# Patient Record
Sex: Male | Born: 1945 | Race: White | Hispanic: No | Smoking: Former smoker
Health system: Southern US, Community
[De-identification: ages and names within clinical notes are randomized; demographics above are authoritative.]

## PROBLEM LIST (undated history)

## (undated) DIAGNOSIS — N186 End stage renal disease: Secondary | ICD-10-CM

## (undated) DIAGNOSIS — K851 Biliary acute pancreatitis without necrosis or infection: Secondary | ICD-10-CM

## (undated) DIAGNOSIS — F418 Other specified anxiety disorders: Secondary | ICD-10-CM

## (undated) DIAGNOSIS — I119 Hypertensive heart disease without heart failure: Secondary | ICD-10-CM

## (undated) DIAGNOSIS — C61 Malignant neoplasm of prostate: Secondary | ICD-10-CM

## (undated) DIAGNOSIS — I2510 Atherosclerotic heart disease of native coronary artery without angina pectoris: Secondary | ICD-10-CM

## (undated) DIAGNOSIS — I5042 Chronic combined systolic (congestive) and diastolic (congestive) heart failure: Secondary | ICD-10-CM

## (undated) DIAGNOSIS — Z8619 Personal history of other infectious and parasitic diseases: Secondary | ICD-10-CM

## (undated) DIAGNOSIS — D696 Thrombocytopenia, unspecified: Secondary | ICD-10-CM

## (undated) DIAGNOSIS — I255 Ischemic cardiomyopathy: Secondary | ICD-10-CM

## (undated) DIAGNOSIS — IMO0001 Reserved for inherently not codable concepts without codable children: Secondary | ICD-10-CM

## (undated) DIAGNOSIS — R162 Hepatomegaly with splenomegaly, not elsewhere classified: Secondary | ICD-10-CM

## (undated) DIAGNOSIS — Z794 Long term (current) use of insulin: Secondary | ICD-10-CM

## (undated) DIAGNOSIS — E7800 Pure hypercholesterolemia, unspecified: Secondary | ICD-10-CM

## (undated) DIAGNOSIS — K219 Gastro-esophageal reflux disease without esophagitis: Secondary | ICD-10-CM

## (undated) DIAGNOSIS — E119 Type 2 diabetes mellitus without complications: Secondary | ICD-10-CM

## (undated) DIAGNOSIS — N184 Chronic kidney disease, stage 4 (severe): Secondary | ICD-10-CM

## (undated) HISTORY — PX: LIVER BIOPSY: SHX301

## (undated) HISTORY — PX: COLONOSCOPY: SHX174

## (undated) HISTORY — PX: TRANSTHORACIC ECHOCARDIOGRAM: SHX275

## (undated) HISTORY — PX: PENILE PROSTHESIS IMPLANT: SHX240

## (undated) HISTORY — DX: Chronic combined systolic (congestive) and diastolic (congestive) heart failure: I50.42

## (undated) HISTORY — DX: Chronic kidney disease, stage 4 (severe): N18.4

## (undated) HISTORY — DX: Ischemic cardiomyopathy: I25.5

## (undated) HISTORY — PX: CORONARY ANGIOPLASTY: SHX604

## (undated) HISTORY — PX: CARDIAC SURGERY: SHX584

## (undated) HISTORY — PX: SHOULDER ARTHROSCOPY: SHX128

## (undated) HISTORY — DX: Atherosclerotic heart disease of native coronary artery without angina pectoris: I25.10

## (undated) HISTORY — DX: Hypertensive heart disease without heart failure: I11.9

---

## 1999-02-07 HISTORY — PX: CORONARY ARTERY BYPASS GRAFT: SHX141

## 2004-03-09 ENCOUNTER — Ambulatory Visit: Payer: Self-pay | Admitting: Internal Medicine

## 2006-06-16 ENCOUNTER — Emergency Department: Payer: Self-pay | Admitting: Internal Medicine

## 2008-01-08 ENCOUNTER — Ambulatory Visit: Payer: Self-pay | Admitting: Cardiovascular Disease

## 2008-01-20 ENCOUNTER — Ambulatory Visit: Payer: Self-pay | Admitting: Oncology

## 2008-02-04 LAB — CMP (CANCER CENTER ONLY)
ALT(SGPT): 23 U/L (ref 10–47)
AST: 28 U/L (ref 11–38)
Albumin: 3.8 g/dL (ref 3.3–5.5)
Alkaline Phosphatase: 66 U/L (ref 26–84)
BUN, Bld: 18 mg/dL (ref 7–22)
Chloride: 102 mEq/L (ref 98–108)
Potassium: 4.3 mEq/L (ref 3.3–4.7)
Sodium: 139 mEq/L (ref 128–145)
Total Protein: 6.9 g/dL (ref 6.4–8.1)

## 2008-02-04 LAB — MORPHOLOGY - CHCC SATELLITE: PLT EST ~~LOC~~: DECREASED

## 2008-02-04 LAB — CBC WITH DIFFERENTIAL (CANCER CENTER ONLY)
BASO%: 1.3 % (ref 0.0–2.0)
Eosinophils Absolute: 0.1 10*3/uL (ref 0.0–0.5)
LYMPH#: 1.3 10*3/uL (ref 0.9–3.3)
LYMPH%: 23 % (ref 14.0–48.0)
MCV: 86 fL (ref 82–98)
MONO#: 0.3 10*3/uL (ref 0.1–0.9)
NEUT#: 4 10*3/uL (ref 1.5–6.5)
Platelets: 81 10*3/uL — ABNORMAL LOW (ref 145–400)
RBC: 5.44 10*6/uL (ref 4.20–5.70)
RDW: 13.1 % (ref 10.5–14.6)
WBC: 5.8 10*3/uL (ref 4.0–10.0)

## 2008-02-05 LAB — IRON AND TIBC: Iron: 80 ug/dL (ref 42–165)

## 2008-02-05 LAB — ERYTHROPOIETIN: Erythropoietin: 12.8 m[IU]/mL (ref 2.6–34.0)

## 2008-03-19 ENCOUNTER — Ambulatory Visit: Payer: Self-pay | Admitting: Oncology

## 2008-03-23 LAB — CBC WITH DIFFERENTIAL (CANCER CENTER ONLY)
BASO%: 1.5 % (ref 0.0–2.0)
Eosinophils Absolute: 0.1 10*3/uL (ref 0.0–0.5)
LYMPH%: 22.3 % (ref 14.0–48.0)
MCH: 29.5 pg (ref 28.0–33.4)
MONO%: 5.9 % (ref 0.0–13.0)
NEUT#: 4.1 10*3/uL (ref 1.5–6.5)
Platelets: 93 10*3/uL — ABNORMAL LOW (ref 145–400)
RDW: 12.7 % (ref 10.5–14.6)

## 2008-03-25 ENCOUNTER — Emergency Department: Payer: Self-pay | Admitting: Emergency Medicine

## 2008-09-16 ENCOUNTER — Ambulatory Visit: Payer: Self-pay | Admitting: Oncology

## 2009-04-19 ENCOUNTER — Ambulatory Visit: Payer: Self-pay | Admitting: Orthopedic Surgery

## 2012-11-01 ENCOUNTER — Ambulatory Visit: Payer: Self-pay | Admitting: Internal Medicine

## 2012-11-06 ENCOUNTER — Ambulatory Visit: Payer: Self-pay | Admitting: Internal Medicine

## 2012-11-06 LAB — CBC CANCER CENTER
HCT: 37.4 % — ABNORMAL LOW (ref 40.0–52.0)
HGB: 12.6 g/dL — ABNORMAL LOW (ref 13.0–18.0)
Lymphocytes: 24 %
MCHC: 33.8 g/dL (ref 32.0–36.0)
MCV: 85 fL (ref 80–100)
RDW: 14.6 % — ABNORMAL HIGH (ref 11.5–14.5)
Segmented Neutrophils: 64 %
WBC: 6.2 x10 3/mm (ref 3.8–10.6)

## 2012-11-06 LAB — IRON AND TIBC
Iron Saturation: 18 %
Iron: 59 ug/dL — ABNORMAL LOW (ref 65–175)
Unbound Iron-Bind.Cap.: 266 ug/dL

## 2012-11-06 LAB — PROTIME-INR
INR: 1.1
Prothrombin Time: 14 secs (ref 11.5–14.7)

## 2012-11-06 LAB — RETICULOCYTES
Absolute Retic Count: 0.1577 10*6/uL (ref 0.019–0.186)
Reticulocyte: 3.59 % — ABNORMAL HIGH (ref 0.4–3.1)

## 2012-11-06 LAB — APTT: Activated PTT: 31 secs (ref 23.6–35.9)

## 2012-11-06 LAB — FERRITIN: Ferritin (ARMC): 305 ng/mL (ref 8–388)

## 2012-11-06 LAB — FIBRIN DEGRADATION PROD.(ARMC ONLY): Fibrin Degradation Prod.: >10 ug/ml — ABNORMAL HIGH (ref 2.1–7.7)

## 2012-12-07 ENCOUNTER — Ambulatory Visit: Payer: Self-pay | Admitting: Internal Medicine

## 2013-02-06 DIAGNOSIS — K851 Biliary acute pancreatitis without necrosis or infection: Secondary | ICD-10-CM

## 2013-02-06 DIAGNOSIS — R162 Hepatomegaly with splenomegaly, not elsewhere classified: Secondary | ICD-10-CM

## 2013-02-06 DIAGNOSIS — D696 Thrombocytopenia, unspecified: Secondary | ICD-10-CM

## 2013-02-06 HISTORY — PX: AV FISTULA PLACEMENT: SHX1204

## 2013-02-06 HISTORY — DX: Hepatomegaly with splenomegaly, not elsewhere classified: R16.2

## 2013-02-06 HISTORY — DX: Thrombocytopenia, unspecified: D69.6

## 2013-02-06 HISTORY — DX: Biliary acute pancreatitis without necrosis or infection: K85.10

## 2013-02-11 ENCOUNTER — Ambulatory Visit: Payer: Self-pay | Admitting: Nephrology

## 2013-02-20 ENCOUNTER — Ambulatory Visit: Payer: Self-pay | Admitting: Internal Medicine

## 2013-02-20 LAB — CBC CANCER CENTER
BASOS ABS: 0 x10 3/mm (ref 0.0–0.1)
BASOS PCT: 0.5 %
EOS ABS: 0.1 x10 3/mm (ref 0.0–0.7)
Eosinophil %: 1.7 %
HCT: 36.9 % — ABNORMAL LOW (ref 40.0–52.0)
HGB: 12.2 g/dL — ABNORMAL LOW (ref 13.0–18.0)
LYMPHS ABS: 1.4 x10 3/mm (ref 1.0–3.6)
LYMPHS PCT: 23 %
MCH: 27.5 pg (ref 26.0–34.0)
MCHC: 33 g/dL (ref 32.0–36.0)
MCV: 83 fL (ref 80–100)
MONO ABS: 0.4 x10 3/mm (ref 0.2–1.0)
Monocyte %: 7.3 %
Neutrophil #: 4.1 x10 3/mm (ref 1.4–6.5)
Neutrophil %: 67.5 %
Platelet: 79 x10 3/mm — ABNORMAL LOW (ref 150–440)
RBC: 4.43 10*6/uL (ref 4.40–5.90)
RDW: 14.3 % (ref 11.5–14.5)
WBC: 6.1 x10 3/mm (ref 3.8–10.6)

## 2013-02-21 LAB — PSA: PSA: <0.1 ng/mL (ref 0.0–4.0)

## 2013-03-04 ENCOUNTER — Ambulatory Visit: Payer: Self-pay | Admitting: Internal Medicine

## 2013-03-09 ENCOUNTER — Ambulatory Visit: Payer: Self-pay | Admitting: Internal Medicine

## 2013-03-26 ENCOUNTER — Ambulatory Visit: Payer: Self-pay | Admitting: Gastroenterology

## 2013-04-01 DIAGNOSIS — E119 Type 2 diabetes mellitus without complications: Secondary | ICD-10-CM | POA: Diagnosis present

## 2013-04-01 DIAGNOSIS — E7800 Pure hypercholesterolemia, unspecified: Secondary | ICD-10-CM | POA: Diagnosis present

## 2013-04-01 DIAGNOSIS — K7689 Other specified diseases of liver: Secondary | ICD-10-CM | POA: Diagnosis present

## 2013-04-01 DIAGNOSIS — I509 Heart failure, unspecified: Secondary | ICD-10-CM | POA: Diagnosis present

## 2013-04-01 DIAGNOSIS — I252 Old myocardial infarction: Secondary | ICD-10-CM

## 2013-04-01 DIAGNOSIS — Z794 Long term (current) use of insulin: Secondary | ICD-10-CM

## 2013-04-01 DIAGNOSIS — D649 Anemia, unspecified: Secondary | ICD-10-CM | POA: Diagnosis present

## 2013-04-01 DIAGNOSIS — N289 Disorder of kidney and ureter, unspecified: Secondary | ICD-10-CM | POA: Diagnosis not present

## 2013-04-01 DIAGNOSIS — M109 Gout, unspecified: Secondary | ICD-10-CM | POA: Diagnosis present

## 2013-04-01 DIAGNOSIS — Z87891 Personal history of nicotine dependence: Secondary | ICD-10-CM

## 2013-04-01 DIAGNOSIS — Z882 Allergy status to sulfonamides status: Secondary | ICD-10-CM

## 2013-04-01 DIAGNOSIS — I129 Hypertensive chronic kidney disease with stage 1 through stage 4 chronic kidney disease, or unspecified chronic kidney disease: Secondary | ICD-10-CM | POA: Diagnosis present

## 2013-04-01 DIAGNOSIS — Z951 Presence of aortocoronary bypass graft: Secondary | ICD-10-CM

## 2013-04-01 DIAGNOSIS — Z806 Family history of leukemia: Secondary | ICD-10-CM

## 2013-04-01 DIAGNOSIS — K219 Gastro-esophageal reflux disease without esophagitis: Secondary | ICD-10-CM | POA: Diagnosis present

## 2013-04-01 DIAGNOSIS — F411 Generalized anxiety disorder: Secondary | ICD-10-CM | POA: Diagnosis present

## 2013-04-01 DIAGNOSIS — I2510 Atherosclerotic heart disease of native coronary artery without angina pectoris: Secondary | ICD-10-CM | POA: Diagnosis present

## 2013-04-01 DIAGNOSIS — Z833 Family history of diabetes mellitus: Secondary | ICD-10-CM

## 2013-04-01 DIAGNOSIS — F329 Major depressive disorder, single episode, unspecified: Secondary | ICD-10-CM | POA: Diagnosis present

## 2013-04-01 DIAGNOSIS — Z8546 Personal history of malignant neoplasm of prostate: Secondary | ICD-10-CM

## 2013-04-01 DIAGNOSIS — F3289 Other specified depressive episodes: Secondary | ICD-10-CM | POA: Diagnosis present

## 2013-04-01 DIAGNOSIS — Z8249 Family history of ischemic heart disease and other diseases of the circulatory system: Secondary | ICD-10-CM

## 2013-04-01 DIAGNOSIS — I5043 Acute on chronic combined systolic (congestive) and diastolic (congestive) heart failure: Principal | ICD-10-CM | POA: Diagnosis present

## 2013-04-01 DIAGNOSIS — Z23 Encounter for immunization: Secondary | ICD-10-CM

## 2013-04-01 DIAGNOSIS — Z79899 Other long term (current) drug therapy: Secondary | ICD-10-CM

## 2013-04-01 DIAGNOSIS — Z9861 Coronary angioplasty status: Secondary | ICD-10-CM

## 2013-04-01 DIAGNOSIS — D696 Thrombocytopenia, unspecified: Secondary | ICD-10-CM | POA: Diagnosis present

## 2013-04-01 DIAGNOSIS — N184 Chronic kidney disease, stage 4 (severe): Secondary | ICD-10-CM | POA: Diagnosis present

## 2013-04-01 DIAGNOSIS — J189 Pneumonia, unspecified organism: Secondary | ICD-10-CM | POA: Diagnosis present

## 2013-04-02 ENCOUNTER — Emergency Department (HOSPITAL_COMMUNITY): Payer: Medicare Other

## 2013-04-02 ENCOUNTER — Encounter (HOSPITAL_COMMUNITY): Payer: Self-pay | Admitting: Emergency Medicine

## 2013-04-02 ENCOUNTER — Inpatient Hospital Stay (HOSPITAL_COMMUNITY)
Admission: EM | Admit: 2013-04-02 | Discharge: 2013-04-03 | DRG: 291 | Disposition: A | Discharge status: Home / Self Care | Payer: Medicare Other | Attending: Internal Medicine | Admitting: Internal Medicine

## 2013-04-02 DIAGNOSIS — E119 Type 2 diabetes mellitus without complications: Secondary | ICD-10-CM

## 2013-04-02 DIAGNOSIS — I509 Heart failure, unspecified: Secondary | ICD-10-CM

## 2013-04-02 DIAGNOSIS — I2510 Atherosclerotic heart disease of native coronary artery without angina pectoris: Secondary | ICD-10-CM | POA: Insufficient documentation

## 2013-04-02 DIAGNOSIS — N189 Chronic kidney disease, unspecified: Secondary | ICD-10-CM

## 2013-04-02 DIAGNOSIS — Z951 Presence of aortocoronary bypass graft: Secondary | ICD-10-CM | POA: Diagnosis present

## 2013-04-02 DIAGNOSIS — E1129 Type 2 diabetes mellitus with other diabetic kidney complication: Secondary | ICD-10-CM | POA: Diagnosis present

## 2013-04-02 DIAGNOSIS — R609 Edema, unspecified: Secondary | ICD-10-CM

## 2013-04-02 DIAGNOSIS — D649 Anemia, unspecified: Secondary | ICD-10-CM | POA: Diagnosis present

## 2013-04-02 DIAGNOSIS — D696 Thrombocytopenia, unspecified: Secondary | ICD-10-CM

## 2013-04-02 DIAGNOSIS — I5023 Acute on chronic systolic (congestive) heart failure: Secondary | ICD-10-CM | POA: Diagnosis present

## 2013-04-02 HISTORY — DX: Pure hypercholesterolemia, unspecified: E78.00

## 2013-04-02 HISTORY — DX: Personal history of other infectious and parasitic diseases: Z86.19

## 2013-04-02 HISTORY — DX: Gastro-esophageal reflux disease without esophagitis: K21.9

## 2013-04-02 HISTORY — DX: Thrombocytopenia, unspecified: D69.6

## 2013-04-02 HISTORY — DX: Malignant neoplasm of prostate: C61

## 2013-04-02 LAB — CBC
HCT: 32.1 % — ABNORMAL LOW (ref 39.0–52.0)
Hemoglobin: 10.8 g/dL — ABNORMAL LOW (ref 13.0–17.0)
MCH: 28.9 pg (ref 26.0–34.0)
MCHC: 33.6 g/dL (ref 30.0–36.0)
MCV: 85.8 fL (ref 78.0–100.0)
Platelets: 58 10*3/uL — ABNORMAL LOW (ref 150–400)
RBC: 3.74 MIL/uL — AB (ref 4.22–5.81)
RDW: 15.2 % (ref 11.5–15.5)
WBC: 5.4 10*3/uL (ref 4.0–10.5)

## 2013-04-02 LAB — COMPREHENSIVE METABOLIC PANEL
ALT: 16 U/L (ref 0–53)
AST: 20 U/L (ref 0–37)
Albumin: 3.3 g/dL — ABNORMAL LOW (ref 3.5–5.2)
Alkaline Phosphatase: 84 U/L (ref 39–117)
BUN: 52 mg/dL — ABNORMAL HIGH (ref 6–23)
CHLORIDE: 103 meq/L (ref 96–112)
CO2: 24 meq/L (ref 19–32)
Calcium: 8.4 mg/dL (ref 8.4–10.5)
Creatinine, Ser: 2.79 mg/dL — ABNORMAL HIGH (ref 0.50–1.35)
GFR calc Af Amer: 26 mL/min — ABNORMAL LOW (ref >90)
GFR, EST NON AFRICAN AMERICAN: 22 mL/min — AB (ref >90)
GLUCOSE: 99 mg/dL (ref 70–99)
Potassium: 4.2 mEq/L (ref 3.7–5.3)
SODIUM: 143 meq/L (ref 137–147)
TOTAL PROTEIN: 7.5 g/dL (ref 6.0–8.3)
Total Bilirubin: 0.8 mg/dL (ref 0.3–1.2)

## 2013-04-02 LAB — GLUCOSE, CAPILLARY
GLUCOSE-CAPILLARY: 72 mg/dL (ref 70–99)
Glucose-Capillary: 102 mg/dL — ABNORMAL HIGH (ref 70–99)
Glucose-Capillary: 47 mg/dL — ABNORMAL LOW (ref 70–99)
Glucose-Capillary: 62 mg/dL — ABNORMAL LOW (ref 70–99)
Glucose-Capillary: 141 mg/dL — ABNORMAL HIGH (ref 70–99)
Glucose-Capillary: 117 mg/dL — ABNORMAL HIGH (ref 70–99)
Glucose-Capillary: 184 mg/dL — ABNORMAL HIGH (ref 70–99)

## 2013-04-02 LAB — BASIC METABOLIC PANEL
BUN: 54 mg/dL — AB (ref 6–23)
CALCIUM: 8.1 mg/dL — AB (ref 8.4–10.5)
CO2: 24 meq/L (ref 19–32)
Chloride: 103 mEq/L (ref 96–112)
Creatinine, Ser: 2.89 mg/dL — ABNORMAL HIGH (ref 0.50–1.35)
GFR calc Af Amer: 25 mL/min — ABNORMAL LOW (ref >90)
GFR calc non Af Amer: 21 mL/min — ABNORMAL LOW (ref >90)
GLUCOSE: 110 mg/dL — AB (ref 70–99)
Potassium: 4.4 mEq/L (ref 3.7–5.3)
SODIUM: 139 meq/L (ref 137–147)

## 2013-04-02 LAB — CBC WITH DIFFERENTIAL/PLATELET
BASOS PCT: 0 % (ref 0–1)
Basophils Absolute: 0 10*3/uL (ref 0.0–0.1)
Eosinophils Absolute: 0.1 10*3/uL (ref 0.0–0.7)
Eosinophils Relative: 2 % (ref 0–5)
HEMATOCRIT: 30.4 % — AB (ref 39.0–52.0)
Hemoglobin: 10.2 g/dL — ABNORMAL LOW (ref 13.0–17.0)
Lymphocytes Relative: 17 % (ref 12–46)
Lymphs Abs: 0.8 10*3/uL (ref 0.7–4.0)
MCH: 28.7 pg (ref 26.0–34.0)
MCHC: 33.6 g/dL (ref 30.0–36.0)
MCV: 85.4 fL (ref 78.0–100.0)
MONO ABS: 0.4 10*3/uL (ref 0.1–1.0)
Monocytes Relative: 9 % (ref 3–12)
NEUTROS ABS: 3.7 10*3/uL (ref 1.7–7.7)
Neutrophils Relative %: 73 % (ref 43–77)
Platelets: 65 10*3/uL — ABNORMAL LOW (ref 150–400)
RBC: 3.56 MIL/uL — ABNORMAL LOW (ref 4.22–5.81)
RDW: 15 % (ref 11.5–15.5)
WBC: 5.1 10*3/uL (ref 4.0–10.5)

## 2013-04-02 LAB — POTASSIUM: Potassium: 5 mEq/L (ref 3.7–5.3)

## 2013-04-02 LAB — URINALYSIS, ROUTINE W REFLEX MICROSCOPIC
BILIRUBIN URINE: NEGATIVE
GLUCOSE, UA: NEGATIVE mg/dL
Hgb urine dipstick: NEGATIVE
Ketones, ur: NEGATIVE mg/dL
Leukocytes, UA: NEGATIVE
NITRITE: NEGATIVE
PH: 6 (ref 5.0–8.0)
Protein, ur: 100 mg/dL — AB
SPECIFIC GRAVITY, URINE: 1.008 (ref 1.005–1.030)
Urobilinogen, UA: 0.2 mg/dL (ref 0.0–1.0)

## 2013-04-02 LAB — URINE MICROSCOPIC-ADD ON

## 2013-04-02 LAB — MAGNESIUM: Magnesium: 1.7 mg/dL (ref 1.5–2.5)

## 2013-04-02 LAB — PROCALCITONIN

## 2013-04-02 LAB — TROPONIN I
Troponin I: <0.3 ng/mL (ref <0.30)
Troponin I: <0.3 ng/mL (ref <0.30)

## 2013-04-02 LAB — I-STAT TROPONIN, ED: TROPONIN I, POC: 0.03 ng/mL (ref 0.00–0.08)

## 2013-04-02 LAB — PRO B NATRIURETIC PEPTIDE: PRO B NATRI PEPTIDE: 4102 pg/mL — AB (ref 0–125)

## 2013-04-02 MED ORDER — INSULIN GLARGINE 100 UNIT/ML ~~LOC~~ SOLN
20.0000 [IU] | Freq: Every day | SUBCUTANEOUS | Status: DC
  Administered 2013-04-02: 20 [IU] via SUBCUTANEOUS
  Filled 2013-04-02 (×2): qty 0.2

## 2013-04-02 MED ORDER — AMITRIPTYLINE HCL 10 MG PO TABS
10.0000 mg | ORAL_TABLET | Freq: Every day | ORAL | Status: DC
  Administered 2013-04-02: 10 mg via ORAL
  Filled 2013-04-02 (×2): qty 1

## 2013-04-02 MED ORDER — ALPRAZOLAM 0.5 MG PO TABS
0.5000 mg | ORAL_TABLET | Freq: Every day | ORAL | Status: DC
  Administered 2013-04-02: 0.5 mg via ORAL
  Filled 2013-04-02: qty 1

## 2013-04-02 MED ORDER — ACETAMINOPHEN 650 MG RE SUPP: 650.0000 mg | Freq: Four times a day (QID) | RECTAL | Status: DC | PRN

## 2013-04-02 MED ORDER — FUROSEMIDE 10 MG/ML IJ SOLN
40.0000 mg | Freq: Two times a day (BID) | INTRAMUSCULAR | Status: DC
  Administered 2013-04-03: 40 mg via INTRAVENOUS
  Filled 2013-04-02 (×3): qty 4

## 2013-04-02 MED ORDER — ACETAMINOPHEN 325 MG PO TABS: 650.0000 mg | ORAL_TABLET | Freq: Four times a day (QID) | ORAL | Status: DC | PRN

## 2013-04-02 MED ORDER — LEVOFLOXACIN 500 MG PO TABS
500.0000 mg | ORAL_TABLET | Freq: Once | ORAL | Status: AC
  Administered 2013-04-02: 500 mg via ORAL
  Filled 2013-04-02: qty 1

## 2013-04-02 MED ORDER — CARVEDILOL 12.5 MG PO TABS
12.5000 mg | ORAL_TABLET | Freq: Two times a day (BID) | ORAL | Status: DC
  Administered 2013-04-03: 12.5 mg via ORAL
  Filled 2013-04-02 (×3): qty 1

## 2013-04-02 MED ORDER — SODIUM CHLORIDE 0.9 % IJ SOLN
3.0000 mL | Freq: Two times a day (BID) | INTRAMUSCULAR | Status: DC
  Administered 2013-04-02 – 2013-04-03 (×3): 3 mL via INTRAVENOUS

## 2013-04-02 MED ORDER — PREGABALIN 100 MG PO CAPS
300.0000 mg | ORAL_CAPSULE | Freq: Two times a day (BID) | ORAL | Status: DC
  Administered 2013-04-02 – 2013-04-03 (×2): 300 mg via ORAL
  Filled 2013-04-02 (×2): qty 3

## 2013-04-02 MED ORDER — LEVOFLOXACIN IN D5W 750 MG/150ML IV SOLN
750.0000 mg | INTRAVENOUS | Status: DC
  Administered 2013-04-03: 750 mg via INTRAVENOUS
  Filled 2013-04-02: qty 150

## 2013-04-02 MED ORDER — INSULIN GLARGINE 100 UNIT/ML ~~LOC~~ SOLN
40.0000 [IU] | Freq: Every day | SUBCUTANEOUS | Status: DC
  Filled 2013-04-02: qty 0.4

## 2013-04-02 MED ORDER — LOSARTAN POTASSIUM 25 MG PO TABS
25.0000 mg | ORAL_TABLET | Freq: Every day | ORAL | Status: DC
  Administered 2013-04-02: 25 mg via ORAL
  Filled 2013-04-02 (×2): qty 1

## 2013-04-02 MED ORDER — ASPIRIN 81 MG PO CHEW
324.0000 mg | CHEWABLE_TABLET | Freq: Once | ORAL | Status: DC
  Filled 2013-04-02: qty 4

## 2013-04-02 MED ORDER — PNEUMOCOCCAL VAC POLYVALENT 25 MCG/0.5ML IJ INJ
0.5000 mL | INJECTION | INTRAMUSCULAR | Status: DC
  Filled 2013-04-02: qty 0.5

## 2013-04-02 MED ORDER — ONDANSETRON HCL 4 MG PO TABS: 4.0000 mg | ORAL_TABLET | Freq: Four times a day (QID) | ORAL | Status: DC | PRN

## 2013-04-02 MED ORDER — ATORVASTATIN CALCIUM 20 MG PO TABS
20.0000 mg | ORAL_TABLET | Freq: Every day | ORAL | Status: DC
  Administered 2013-04-02: 20 mg via ORAL
  Filled 2013-04-02 (×2): qty 1

## 2013-04-02 MED ORDER — SODIUM CHLORIDE 0.9 % IJ SOLN
3.0000 mL | Freq: Two times a day (BID) | INTRAMUSCULAR | Status: DC
  Administered 2013-04-02: 3 mL via INTRAVENOUS

## 2013-04-02 MED ORDER — HYDRALAZINE HCL 50 MG PO TABS
50.0000 mg | ORAL_TABLET | Freq: Two times a day (BID) | ORAL | Status: DC
  Administered 2013-04-02 – 2013-04-03 (×3): 50 mg via ORAL
  Filled 2013-04-02 (×4): qty 1

## 2013-04-02 MED ORDER — ASPIRIN EC 325 MG PO TBEC
325.0000 mg | DELAYED_RELEASE_TABLET | Freq: Every day | ORAL | Status: DC
  Filled 2013-04-02 (×2): qty 1

## 2013-04-02 MED ORDER — PREGABALIN 25 MG PO CAPS
150.0000 mg | ORAL_CAPSULE | Freq: Two times a day (BID) | ORAL | Status: DC
  Administered 2013-04-02: 150 mg via ORAL
  Filled 2013-04-02: qty 1
  Filled 2013-04-02: qty 2

## 2013-04-02 MED ORDER — ALLOPURINOL 100 MG PO TABS
100.0000 mg | ORAL_TABLET | Freq: Every day | ORAL | Status: DC
  Administered 2013-04-02 – 2013-04-03 (×2): 100 mg via ORAL
  Filled 2013-04-02 (×2): qty 1

## 2013-04-02 MED ORDER — INSULIN ASPART PROT & ASPART (70-30 MIX) 100 UNIT/ML ~~LOC~~ SUSP
40.0000 [IU] | Freq: Every day | SUBCUTANEOUS | Status: DC
  Administered 2013-04-02: 40 [IU] via SUBCUTANEOUS
  Filled 2013-04-02: qty 10

## 2013-04-02 MED ORDER — GABAPENTIN 100 MG PO CAPS: 200.0000 mg | ORAL_CAPSULE | Freq: Three times a day (TID) | ORAL | Status: DC

## 2013-04-02 MED ORDER — CARVEDILOL 25 MG PO TABS
25.0000 mg | ORAL_TABLET | Freq: Two times a day (BID) | ORAL | Status: DC
  Filled 2013-04-02 (×4): qty 1

## 2013-04-02 MED ORDER — VITAMIN D (ERGOCALCIFEROL) 1.25 MG (50000 UNIT) PO CAPS: 50000.0000 [IU] | ORAL_CAPSULE | ORAL | Status: DC

## 2013-04-02 MED ORDER — FUROSEMIDE 10 MG/ML IJ SOLN
40.0000 mg | Freq: Once | INTRAMUSCULAR | Status: AC
  Administered 2013-04-02: 40 mg via INTRAVENOUS
  Filled 2013-04-02: qty 4

## 2013-04-02 MED ORDER — FUROSEMIDE 10 MG/ML IJ SOLN
40.0000 mg | Freq: Three times a day (TID) | INTRAMUSCULAR | Status: DC
  Administered 2013-04-02 (×2): 40 mg via INTRAVENOUS
  Filled 2013-04-02 (×2): qty 4

## 2013-04-02 MED ORDER — INSULIN ASPART PROT & ASPART (70-30 MIX) 100 UNIT/ML ~~LOC~~ SUSP: 30.0000 [IU] | Freq: Every day | SUBCUTANEOUS | Status: DC

## 2013-04-02 MED ORDER — ONDANSETRON HCL 4 MG/2ML IJ SOLN: 4.0000 mg | Freq: Four times a day (QID) | INTRAMUSCULAR | Status: DC | PRN

## 2013-04-02 MED ORDER — CYCLOBENZAPRINE HCL 10 MG PO TABS
5.0000 mg | ORAL_TABLET | Freq: Three times a day (TID) | ORAL | Status: DC | PRN
  Administered 2013-04-02: 5 mg via ORAL
  Filled 2013-04-02 (×2): qty 1

## 2013-04-02 MED ORDER — INSULIN ASPART 100 UNIT/ML ~~LOC~~ SOLN
0.0000 [IU] | Freq: Three times a day (TID) | SUBCUTANEOUS | Status: DC
  Administered 2013-04-03: 3 [IU] via SUBCUTANEOUS
  Administered 2013-04-03: 1 [IU] via SUBCUTANEOUS

## 2013-04-02 MED ORDER — INSULIN ASPART PROT & ASPART (70-30 MIX) 100 UNIT/ML ~~LOC~~ SUSP
25.0000 [IU] | Freq: Every day | SUBCUTANEOUS | Status: DC
  Administered 2013-04-03: 25 [IU] via SUBCUTANEOUS
  Filled 2013-04-02: qty 10

## 2013-04-02 NOTE — Progress Notes (Signed)
Chart review complete.  Patient is not eligible for [facility] Care Management services because his/her PCP is not a [facility] primary care provider or is not [facility] affiliated.  For any additional questions or new referrals please contact Tim Henderson BSN RN [facility] Liaison at [phone] °

## 2013-04-02 NOTE — ED Notes (Signed)
Admitting physician at bedside

## 2013-04-02 NOTE — ED Provider Notes (Signed)
CSN: [id number]     Arrival date & time 04/01/13  2359 History   First MD Initiated Contact with Patient 04/02/13 [phone]     Chief Complaint  Patient presents with  . Shortness of Breath     (Consider location/radiation/quality/duration/timing/severity/associated sxs/prior Treatment) HPI Hx provided by patient. Shortness of breath worse over the last 24 hours, has been ongoing for the last few weeks. As increased ankle swelling. Has previous history of MI and CABG, followed by cardiology in [place].  Brought in by daughter this evening. She works with Futures trader and checked his pulse ox, reportedly in the 70s after ambulating tonight. Patient admits to severe dyspnea on exertion. He denies any chest pain. No fevers or cough. No unilateral leg pain. Remains dyspneic at rest, but much improved.  Past Medical History  Diagnosis Date  . Hypertension   . Diabetes mellitus without complication   . Hypercholesterolemia   . Prostate cancer   . Renal disorder   . Enlarged liver   . Spleen enlarged   . Thrombocytopenia    Past Surgical History  Procedure Laterality Date  . Cardiac surgery    . Shoulder arthroscopy    . Penile prosthesis implant    . Myocardial infarction     History reviewed. No pertinent family history. History  Substance Use Topics  . Smoking status: Former Research scientist (life sciences)  . Smokeless tobacco: Former Systems developer  . Alcohol Use: No    Review of Systems  Constitutional: Negative for fever and chills.  Respiratory: Positive for shortness of breath.   Cardiovascular: Positive for leg swelling. Negative for chest pain.  Gastrointestinal: Negative for vomiting and abdominal pain.  Genitourinary: Negative for dysuria and hematuria.  Musculoskeletal: Negative for back pain, neck pain and neck stiffness.  Skin: Negative for rash.  Neurological: Negative for syncope and headaches.  All other systems reviewed and are negative.      Allergies  Sulfa  antibiotics  Home Medications   Current Outpatient Rx  Name  Route  Sig  Dispense  Refill  . allopurinol (ZYLOPRIM) 100 MG tablet               . ALPRAZolam (XANAX) 0.5 MG tablet               . CRESTOR 10 MG tablet               . hydrALAZINE (APRESOLINE) 50 MG tablet               . LANTUS SOLOSTAR 100 UNIT/ML Solostar Pen               . losartan (COZAAR) 25 MG tablet               . LYRICA 150 MG capsule               . torsemide (DEMADEX) 20 MG tablet                BP 115/60  Pulse 54  Temp(Src) 97.9 F (36.6 C) (Oral)  Resp 19  Ht 5\' 10"  (1.778 m)  Wt 247 lb (112.038 kg)  BMI 35.44 kg/m2  SpO2 95% Physical Exam  Constitutional: He is oriented to person, place, and time. He appears well-developed and well-nourished.  HENT:  Head: Normocephalic and atraumatic.  Eyes: EOM are normal. Pupils are equal, round, and reactive to light.  Neck: Neck supple.  Cardiovascular: Normal rate, regular rhythm and intact distal pulses.  Pulmonary/Chest: Effort normal. No respiratory distress.  Decreased bilateral breath sounds, mild tachypnea respiratory rate 22  Abdominal: Soft. He exhibits no distension. There is no tenderness.  Musculoskeletal: Normal range of motion. He exhibits no tenderness.  2+ symmetric peripheral edema to lower extremities  Neurological: He is alert and oriented to person, place, and time.  Skin: Skin is warm and dry.    ED Course  Procedures (including critical care time) Labs Review Labs Reviewed  BASIC METABOLIC PANEL - Abnormal; Notable for the following:    Glucose, Bld 110 (*)    BUN 54 (*)    Creatinine, Ser 2.89 (*)    Calcium 8.1 (*)    GFR calc non Af Amer 21 (*)    GFR calc Af Amer 25 (*)    All other components within normal limits  CBC - Abnormal; Notable for the following:    RBC 3.74 (*)    Hemoglobin 10.8 (*)    HCT 32.1 (*)    Platelets 58 (*)    All other components within normal limits   PRO B NATRIURETIC PEPTIDE - Abnormal; Notable for the following:    Pro B Natriuretic peptide (BNP) 4102.0 (*)    All other components within normal limits  TROPONIN I  I-STAT TROPOININ, ED   Imaging Review Dg Chest 2 View (if Patient Has Fever And/or Copd)  04/02/2013   CLINICAL DATA:  Shortness of breath.  EXAM: CHEST  2 VIEW  COMPARISON:  None.  FINDINGS: Cardiomegaly and CABG changes noted.  Lower lobe airspace disease is identified on the lateral view but difficult to localize on the frontal view.  There is no evidence of pulmonary edema, suspicious pulmonary nodule/mass, pleural effusion, or pneumothorax. No acute bony abnormalities are identified.  IMPRESSION: Lower lobe airspace disease on the lateral view likely representing pneumonia. This is difficult to localize on the frontal view.  Cardiomegaly and CABG changes.   Electronically Signed   By: Hassan Rowan M.D.   On: 04/02/2013 01:41    EKG Interpretation    Date/Time:  Wednesday April 02 2013 00:06:38 EST Ventricular Rate:  58 PR Interval:  172 QRS Duration: 114 QT Interval:  422 QTC Calculation: 414 R Axis:   12 Text Interpretation:  Sinus bradycardia Nonspecific ST and T wave abnormality Abnormal ECG No old tracing to compare Confirmed by Ginni Eichler  MD, Lieutenant Abarca ([id number]) on 04/02/2013 12:37:16 AM           Room air pulse ox 96% is adequate   IV Lasix provided. Aspirin provided Chest x-ray reviewed as above - antibiotics provided although no fever/ cough/ productive sputum  Discussed with [facility] hospitalist on-call 1:47 AM DR Hal Hope to admit MDM   Diagnosis: Acute CHF  Evaluated with EKG, chest x-ray and labs reviewed as above - BNP over 4000 Medications provided MED admit    Teressa Lower, MD 04/02/13 [phone]

## 2013-04-02 NOTE — Care Management Note (Addendum)
  Page 1 of 1   04/02/2013     2:12:06 PM   CARE MANAGEMENT NOTE 04/02/2013  Patient:  Joseph [name],Joseph [name]   Account Number:  [id number]  Date Initiated:  04/02/2013  Documentation initiated by:  Mariann Laster  Subjective/Objective Assessment:   Admitted with CHF     Action/Plan:   CM to follow for disposition  needs   Anticipated DC Date:  04/05/2013   Anticipated DC Plan:  HOME/SELF CARE         Choice offered to / List presented to:             Status of service:  In process, will continue to follow Medicare Important Message given?   (If response is "NO", the following Medicare IM given date fields will be blank) Date Medicare IM given:   Date Additional Medicare IM given:    Discharge Disposition:    Per UR Regulation:  Reviewed for med. necessity/level of care/duration of stay  If discussed at [place] of Stay Meetings, dates discussed:    Comments:

## 2013-04-02 NOTE — ED Notes (Signed)
Patient presents stating he has been SOB for about 1 month  Twin brother was in [place] and all the walking he noticed how SOB he was.  Brother passed away 04/13/22.  Stated he has been getting more SOB with exertion

## 2013-04-02 NOTE — Progress Notes (Signed)
*  Preliminary Results* Bilateral lower extremity venous duplex completed. Bilateral lower extremities are negative for deep vein thrombosis. There is no evidence of Baker's cyst bilaterally.  04/02/2013  Maudry Mayhew, RVT, RDCS, RDMS

## 2013-04-02 NOTE — Progress Notes (Signed)
I had a lengthy conversation with Mr. Stockhausen and his daughter regarding HF.  He has limited knowledge regarding this new diagnosis.  I did give him the "Living with HF" book and we reviewed educational materials including signs and symptoms of HF, when to call the physician, importance of daily weights, low sodium diet and fluid restriction.  He lives alone and is fairly independent.  His daughter lives here in [place] and is available to provide assistance as needed.  I will plan to see him again before discharge to reinforce education.  Carole Binning RN, BSN, PCCN--Heart Failure Art therapist

## 2013-04-02 NOTE — Progress Notes (Deleted)
*  PRELIMINARY RESULTS* Vascular Ultrasound Lower extremity venous duplex has been completed.  Preliminary findings: no evidence of DVT.   Landry Mellow, RDMS, RVT  04/02/2013, 8:31 AM

## 2013-04-02 NOTE — Progress Notes (Signed)
UR completed Shamaria Kavan K. Lucky Alverson, RN, BSN, [place], CCM  04/02/2013 2:09 PM

## 2013-04-02 NOTE — Progress Notes (Signed)
   Patient admitted few hours ago for shortness of breath secondary to acute on chronic systolic heart failure along with community-acquired pneumonia, looks stable and in no distress, continue diuresis and antibiotics. Lower extremity duplex unremarkable.  Chronic kidney disease stage IV baseline creatinine around 2.9 currently stable, obtaining records from [place].

## 2013-04-02 NOTE — ED Notes (Signed)
Transporting patient to new room assignment. 

## 2013-04-02 NOTE — H&P (Addendum)
[facility] History and Physical  Joseph [name] XNT:[id number] DOB: 07-08-1945 DOA: 04/02/2013  Referring physician: ER physician. PCP: Nicky Pugh, MD  Specialists: Cardiologist Dr.Khan. In [place].  Chief Complaint: Shortness of breath.  HPI: Joseph [name] is a 68 y.o. male with history of C. status post CABG and stenting, chronic kidney disease baseline creatinine around 2.9, anemia, diabetes mellitus, recently diagnosed hepatosplenomegaly and thrombocytopenia undergoing further workup at [facility] presents to the ER because of shortness of breath. Patient has been having increasing shortness of breath with exertion over the last 3-4 weeks. As per patient's daughter patient has had 2-D echo done last month on January 13 which showed patent EF of around 51% and was also followed by a stress test which patient was not able to complete due to shortness of breath. Patient's daughter states that patient's cardiologist did not want any cardiac catheter done due to patient's renal failure. Patient has been noticed to have increasing swelling of the lower extremities over the last 2-3 months and also gained about 20 pounds of weight in the month of December 2014 2 months ago. Patient's shortness of breath is mostly exertional and has some nonproductive cough. Couple of days ago patient has had some chest tightness but denies any chest pain now. Denies any nausea vomiting abdominal pain diarrhea fever chills. In the ER chest x-ray shows possible pneumonic process for which patient has been placed on Levaquin. Patient's symptoms are more consistent with CHF and patient has been placed on IV Lasix.   Review of Systems: As presented in the history of presenting illness, rest negative.  Past Medical History  Diagnosis Date  . Hypertension   . Diabetes mellitus without complication   . Hypercholesterolemia   . Prostate cancer   . Renal disorder   . Enlarged liver   . Spleen enlarged   .  Thrombocytopenia    Past Surgical History  Procedure Laterality Date  . Cardiac surgery    . Shoulder arthroscopy    . Penile prosthesis implant    . Myocardial infarction    . Cardiac catheterization    . Coronary angioplasty     Social History:  reports that he has quit smoking. He has quit using smokeless tobacco. He reports that he does not drink alcohol or use illicit drugs. Where does patient live home. Can patient participate in ADLs? Yes.  Allergies  Allergen Reactions  . Sulfa Antibiotics     hives    Family History:  Family History  Problem Relation Age of Onset  . Acute myelogenous leukemia Brother   . CAD Brother   . Diabetes Mellitus II Brother       Prior to Admission medications   Medication Sig Start Date End Date Taking? Authorizing Provider  allopurinol (ZYLOPRIM) 100 MG tablet Take 100 mg by mouth daily.  03/10/13  Yes Historical Provider, MD  ALPRAZolam Duanne Moron) 0.5 MG tablet Take 0.5 mg by mouth at bedtime.  03/30/13  Yes Historical Provider, MD  amitriptyline (ELAVIL) 10 MG tablet Take 10 mg by mouth at bedtime.   Yes Historical Provider, MD  Aspirin-Acetaminophen-Caffeine (GOODY HEADACHE PO) Take 1 packet by mouth daily as needed (pain).   Yes Historical Provider, MD  carvedilol (COREG) 25 MG tablet Take 25 mg by mouth 2 (two) times daily with a meal.   Yes Historical Provider, MD  CRESTOR 10 MG tablet Take 10 mg by mouth daily.  02/11/13  Yes Historical Provider, MD  hydrALAZINE (APRESOLINE) 50 MG  tablet Take 50 mg by mouth 2 (two) times daily.  03/20/13  Yes Historical Provider, MD  insulin NPH-regular Human (NOVOLIN 70/30) (70-30) 100 UNIT/ML injection Inject 10-40 Units into the skin 2 (two) times daily with a meal. Inject 40 units every morning and depending on sugar, may inject and additional 10 units at bedtime.   Yes Historical Provider, MD  IRON PO Take 1 tablet by mouth daily.   Yes Historical Provider, MD  LANTUS SOLOSTAR 100 UNIT/ML Solostar Pen  Inject 40 Units into the skin at bedtime.  02/06/13  Yes Historical Provider, MD  losartan (COZAAR) 25 MG tablet Take 25 mg by mouth daily.  03/11/13  Yes Historical Provider, MD  LYRICA 150 MG capsule Take 150 mg by mouth 2 (two) times daily.  03/04/13  Yes Historical Provider, MD  torsemide (DEMADEX) 20 MG tablet Take 20 mg by mouth daily.  03/11/13  Yes Historical Provider, MD  Vitamin D, Ergocalciferol, (DRISDOL) 50000 UNITS CAPS capsule Take 50,000 Units by mouth every 30 (thirty) days.   Yes Historical Provider, MD    Physical Exam: Filed Vitals:   04/02/13 0205 04/02/13 0215 04/02/13 0230 04/02/13 0245  BP: 130/62 116/55 119/55 118/55  Pulse: 55 60 54 54  Temp:      TempSrc:      Resp: 18 12 21 26   Height:      Weight:      SpO2:  96% 94% 93%     General:  Well-developed and nourished.  Eyes: Anicteric no pallor.  ENT: No discharge from ears eyes nose mouth.  Neck: No mass felt. JVD not appreciated.  Cardiovascular: S1-S2 heard.  Respiratory: No rhonchi or crepitations.  Abdomen: Soft nontender bowel sounds present. No guarding or rigidity.  Skin: No rash.  Musculoskeletal: Bilateral lower extremity edema.  Psychiatric: Appears normal.  Neurologic: Alert awake oriented to time place and person. Moves all extremities.  Labs on Admission:  Basic Metabolic Panel:  Recent Labs Lab 04/02/13 0025  NA 139  K 4.4  CL 103  CO2 24  GLUCOSE 110*  BUN 54*  CREATININE 2.89*  CALCIUM 8.1*   Liver Function Tests: No results found for this basename: AST, ALT, ALKPHOS, BILITOT, PROT, ALBUMIN,  in the last 168 hours No results found for this basename: LIPASE, AMYLASE,  in the last 168 hours No results found for this basename: AMMONIA,  in the last 168 hours CBC:  Recent Labs Lab 04/02/13 0025  WBC 5.4  HGB 10.8*  HCT 32.1*  MCV 85.8  PLT 58*   Cardiac Enzymes:  Recent Labs Lab 04/02/13 0025  TROPONINI <0.30    BNP (last 3 results)  Recent Labs   04/02/13 0025  PROBNP 4102.0*   CBG: No results found for this basename: GLUCAP,  in the last 168 hours  Radiological Exams on Admission: Dg Chest 2 View (if Patient Has Fever And/or Copd)  04/02/2013   CLINICAL DATA:  Shortness of breath.  EXAM: CHEST  2 VIEW  COMPARISON:  None.  FINDINGS: Cardiomegaly and CABG changes noted.  Lower lobe airspace disease is identified on the lateral view but difficult to localize on the frontal view.  There is no evidence of pulmonary edema, suspicious pulmonary nodule/mass, pleural effusion, or pneumothorax. No acute bony abnormalities are identified.  IMPRESSION: Lower lobe airspace disease on the lateral view likely representing pneumonia. This is difficult to localize on the frontal view.  Cardiomegaly and CABG changes.   Electronically Signed   By:  Hassan Rowan M.D.   On: 04/02/2013 01:41    EKG: Independently reviewed. Sinus bradycardia.  Assessment/Plan Principal Problem:   Acute CHF Active Problems:   CAD (coronary artery disease)   Diabetes mellitus   CKD (chronic kidney disease)   Anemia   Thrombocytopenia   CHF (congestive heart failure)   1. Decompensated CHF - patient's symptoms are consistent with CHF and physical findings show lower extremity edema. At this time I have placed patient on Lasix 40 mg IV every 8 hourly. Closely follow intake output and metabolic panel. As per patient's daughter patient's last EF done last month was 51% and has also had a stress test which patient was not able to complete. Patient's daughter states she will bring the records from [place] in the morning. Check Dopplers of her lower extremities to rule out DVT. 2. Possible pneumonia - for now I have placed patient on Levaquin. Check procalcitonin level and if negative may discontinue Levaquin. 3. Chronic kidney disease - patient's daughter states patient's creatinine physician on 2.9. Closely follow intake output and metabolic panel. Recently patient has had some  dysuria. Check urinalysis. 4. Diabetes mellitus type 2 - continue home medications. 5. CAD status post stenting - denies any chest pain. Check troponin. 6. Recent diagnosis hepatosplenomegaly with thrombocytopenia - patient is getting workup done at [facility]. 7. Gout - continue home medications.    Code Status: Full code.  Family Communication: Patient's daughter at the bedside.  Disposition Plan: Admit to inpatient.    KAKRAKANDY,ARSHAD N. [facility] Pager [phone].  If 7PM-7AM, please contact night-coverage [email or web address] Password [email or web address] 04/02/2013, 3:30 AM

## 2013-04-03 LAB — BASIC METABOLIC PANEL
BUN: 55 mg/dL — AB (ref 6–23)
CO2: 26 mEq/L (ref 19–32)
Calcium: 8.8 mg/dL (ref 8.4–10.5)
Chloride: 100 mEq/L (ref 96–112)
Creatinine, Ser: 3.04 mg/dL — ABNORMAL HIGH (ref 0.50–1.35)
GFR calc Af Amer: 23 mL/min — ABNORMAL LOW (ref >90)
GFR calc non Af Amer: 20 mL/min — ABNORMAL LOW (ref >90)
GLUCOSE: 149 mg/dL — AB (ref 70–99)
POTASSIUM: 4.5 meq/L (ref 3.7–5.3)
Sodium: 140 mEq/L (ref 137–147)

## 2013-04-03 LAB — CBC
HCT: 33.2 % — ABNORMAL LOW (ref 39.0–52.0)
HEMOGLOBIN: 11.1 g/dL — AB (ref 13.0–17.0)
MCH: 28.5 pg (ref 26.0–34.0)
MCHC: 33.4 g/dL (ref 30.0–36.0)
MCV: 85.1 fL (ref 78.0–100.0)
Platelets: 83 10*3/uL — ABNORMAL LOW (ref 150–400)
RBC: 3.9 MIL/uL — ABNORMAL LOW (ref 4.22–5.81)
RDW: 15.1 % (ref 11.5–15.5)
WBC: 6.7 10*3/uL (ref 4.0–10.5)

## 2013-04-03 LAB — GLUCOSE, CAPILLARY
GLUCOSE-CAPILLARY: 139 mg/dL — AB (ref 70–99)
Glucose-Capillary: 137 mg/dL — ABNORMAL HIGH (ref 70–99)
Glucose-Capillary: 173 mg/dL — ABNORMAL HIGH (ref 70–99)

## 2013-04-03 LAB — TROPONIN I: Troponin I: <0.3 ng/mL (ref <0.30)

## 2013-04-03 MED ORDER — INSULIN GLARGINE 100 UNIT/ML ~~LOC~~ SOLN: 20.0000 [IU] | Freq: Every day | SUBCUTANEOUS | Status: DC

## 2013-04-03 MED ORDER — LEVOFLOXACIN 750 MG PO TABS: 750.0000 mg | ORAL_TABLET | ORAL | Status: DC

## 2013-04-03 MED ORDER — CYCLOBENZAPRINE HCL 5 MG PO TABS: 5.0000 mg | ORAL_TABLET | Freq: Three times a day (TID) | ORAL | Status: DC | PRN

## 2013-04-03 MED ORDER — CARVEDILOL 12.5 MG PO TABS: 12.5000 mg | ORAL_TABLET | Freq: Two times a day (BID) | ORAL | Status: DC

## 2013-04-03 NOTE — Consult Note (Signed)
Referring Provider: No ref. provider found Primary Care Physician:  Nicky Pugh, MD Primary Nephrologist:   [facility] neph[facility]  Reason for Consultation:  Acute on Chronic renal insufficiency  HPI:  Diabetic nephrosclerosis  With baseline creatinine of 2.8 followed in [place]. Admitted with fluid overload responding to intravenous diuresis. Home dose of torsemide 20mg  BID.  status post CABG with EF of around 51% lower extremity edema and shortness of breath on exercise.    Past Medical History  Diagnosis Date  . Hypertension   . Hypercholesterolemia   . Prostate cancer   . Renal disorder   . Enlarged liver   . Spleen enlarged   . Thrombocytopenia   . Myocardial infarction   . Anginal pain   . Anxiety   . Depression   . Shortness of breath   . Diabetes mellitus without complication     INSULIN DEPENDENT  . GERD (gastroesophageal reflux disease)   . History of shingles     Past Surgical History  Procedure Laterality Date  . Cardiac surgery    . Shoulder arthroscopy    . Penile prosthesis implant    . Myocardial infarction    . Cardiac catheterization    . Coronary angioplasty    . Coronary artery bypass graft  2001    Prior to Admission medications   Medication Sig Start Date End Date Taking? Authorizing Provider  allopurinol (ZYLOPRIM) 100 MG tablet Take 100 mg by mouth daily.  03/10/13  Yes Historical Provider, MD  ALPRAZolam Duanne Moron) 0.5 MG tablet Take 0.5 mg by mouth at bedtime.  03/30/13  Yes Historical Provider, MD  amitriptyline (ELAVIL) 10 MG tablet Take 10 mg by mouth at bedtime.   Yes Historical Provider, MD  Aspirin-Acetaminophen-Caffeine (GOODY HEADACHE PO) Take 1 packet by mouth daily as needed (pain).   Yes Historical Provider, MD  CRESTOR 10 MG tablet Take 10 mg by mouth daily.  02/11/13  Yes Historical Provider, MD  hydrALAZINE (APRESOLINE) 50 MG tablet Take 50 mg by mouth 2 (two) times daily.  03/20/13  Yes Historical Provider, MD  insulin  NPH-regular Human (NOVOLIN 70/30) (70-30) 100 UNIT/ML injection Inject 10-40 Units into the skin 2 (two) times daily with a meal. Inject 40 units every morning and depending on sugar, may inject and additional 10 units at bedtime.   Yes Historical Provider, MD  IRON PO Take 1 tablet by mouth daily.   Yes Historical Provider, MD  LANTUS SOLOSTAR 100 UNIT/ML Solostar Pen Inject 40 Units into the skin at bedtime.  02/06/13  Yes Historical Provider, MD  losartan (COZAAR) 25 MG tablet Take 25 mg by mouth daily.  03/11/13  Yes Historical Provider, MD  LYRICA 150 MG capsule Take 150 mg by mouth 2 (two) times daily.  03/04/13  Yes Historical Provider, MD  torsemide (DEMADEX) 20 MG tablet Take 20 mg by mouth daily.  03/11/13  Yes Historical Provider, MD  Vitamin D, Ergocalciferol, (DRISDOL) 50000 UNITS CAPS capsule Take 50,000 Units by mouth every 30 (thirty) days.   Yes Historical Provider, MD  carvedilol (COREG) 12.5 MG tablet Take 1 tablet (12.5 mg total) by mouth 2 (two) times daily with a meal. 04/03/13   Thurnell Lose, MD  cyclobenzaprine (FLEXERIL) 5 MG tablet Take 1 tablet (5 mg total) by mouth 3 (three) times daily as needed for muscle spasms. 04/03/13   Thurnell Lose, MD  levofloxacin (LEVAQUIN) 750 MG tablet Take 1 tablet (750 mg total) by mouth every other day. 04/03/13  Thurnell Lose, MD    Current Facility-Administered Medications  Medication Dose Route Frequency Provider Last Rate Last Dose  . acetaminophen (TYLENOL) tablet 650 mg  650 mg Oral Q6H PRN Rise Patience, MD      . allopurinol (ZYLOPRIM) tablet 100 mg  100 mg Oral Daily Rise Patience, MD   100 mg at 04/03/13 1004  . ALPRAZolam Duanne Moron) tablet 0.5 mg  0.5 mg Oral QHS Rise Patience, MD   0.5 mg at 04/02/13 2211  . amitriptyline (ELAVIL) tablet 10 mg  10 mg Oral QHS Rise Patience, MD   10 mg at 04/02/13 2211  . aspirin chewable tablet 324 mg  324 mg Oral Once Teressa Lower, MD      . aspirin EC tablet 325 mg  325  mg Oral Daily Rise Patience, MD      . atorvastatin (LIPITOR) tablet 20 mg  20 mg Oral q1800 Rise Patience, MD   20 mg at 04/02/13 1814  . carvedilol (COREG) tablet 12.5 mg  12.5 mg Oral BID WC Thurnell Lose, MD   12.5 mg at 04/03/13 [id number]  . cyclobenzaprine (FLEXERIL) tablet 5 mg  5 mg Oral TID PRN Thurnell Lose, MD   5 mg at 04/02/13 1835  . hydrALAZINE (APRESOLINE) tablet 50 mg  50 mg Oral BID Rise Patience, MD   50 mg at 04/03/13 1004  . insulin aspart (novoLOG) injection 0-9 Units  0-9 Units Subcutaneous TID WC Rise Patience, MD   1 Units at 04/03/13 [phone]  . insulin aspart protamine- aspart (NOVOLOG MIX 70/30) injection 25 Units  25 Units Subcutaneous Q breakfast Thurnell Lose, MD   25 Units at 04/03/13 [phone]  . insulin glargine (LANTUS) injection 20 Units  20 Units Subcutaneous Daily Thurnell Lose, MD   20 Units at 04/02/13 2213  . levofloxacin (LEVAQUIN) IVPB 750 mg  750 mg Intravenous Q48H Rise Patience, MD   750 mg at 04/03/13 0835  . ondansetron (ZOFRAN) tablet 4 mg  4 mg Oral Q6H PRN Rise Patience, MD       Or  . ondansetron [facility]) injection 4 mg  4 mg Intravenous Q6H PRN Rise Patience, MD      . pneumococcal 23 valent vaccine (PNU-IMMUNE) injection 0.5 mL  0.5 mL Intramuscular Tomorrow-1000 Thurnell Lose, MD      . pregabalin (LYRICA) capsule 300 mg  300 mg Oral BID Thurnell Lose, MD   300 mg at 04/03/13 1004  . sodium chloride 0.9 % injection 3 mL  3 mL Intravenous Q12H Rise Patience, MD   3 mL at 04/02/13 2217  . sodium chloride 0.9 % injection 3 mL  3 mL Intravenous Q12H Rise Patience, MD   3 mL at 04/03/13 1004  . [START ON 04/06/2013] Vitamin D (Ergocalciferol) (DRISDOL) capsule 50,000 Units  50,000 Units Oral Q30 days Rise Patience, MD        Allergies as of 04/01/2013  . (Not on File)    Family History  Problem Relation Age of Onset  . Acute myelogenous leukemia Brother   . CAD Brother   .  Diabetes Mellitus II Brother     History   Social History  . Marital Status: Married    Spouse Name: N/A    Number of Children: N/A  . Years of Education: N/A   Occupational History  . Not on file.   Social History  Main Topics  . Smoking status: Former Smoker    Quit date: 02/07/2008  . Smokeless tobacco: Former Systems developer  . Alcohol Use: No  . Drug Use: No  . Sexual Activity: Not on file   Other Topics Concern  . Not on file   Social History Narrative  . No narrative on file    Review of Systems: Gen: Denies any fever, chills, sweats, anorexia, fatigue, weakness, malaise, weight loss, and sleep disorder HEENT: No visual complaints, No history of Retinopathy. Normal external appearance No Epistaxis or Sore throat. No sinusitis.   CV: diastolic dysfunction  Resp: Improved dyspnea masimum exercise GI: Denies vomiting blood, jaundice, and fecal incontinence.   Denies dysphagia or odynophagia. GU : Denies urinary burning, blood in urine, urinary frequency, urinary hesitancy, nocturnal urination, and urinary incontinence.  No renal calculi. MS: Denies joint pain, limitation of movement, and swelling, stiffness, low back pain, extremity pain. Denies muscle weakness, cramps, atrophy.  No use of non steroidal antiinflammatory drugs. Derm: Denies rash, itching, dry skin, hives, moles, warts, or unhealing ulcers.  Psych: Denies depression, anxiety, memory loss, suicidal ideation, hallucinations, paranoia, and confusion. Heme: Denies bruising, bleeding, and enlarged lymph nodes. Neuro: No headache.  No diplopia. No dysarthria.  No dysphasia.  No history of CVA.  No Seizures. No paresthesias.  No weakness. Endocrine  DM.  No Thyroid disease.  No Adrenal disease.  Physical Exam: Vital signs in last 24 hours: Temp:  [97.4 F (36.3 C)-98 F (36.7 C)] 98 F (36.7 C) (02/26 0950) Pulse Rate:  [51-57] 55 (02/26 0950) Resp:  [18-20] 18 (02/26 0950) BP: (116-147)/(48-67) 142/60 mmHg (02/26  0950) SpO2:  [91 %-100 %] 94 % (02/26 0950) Weight:  [106.731 kg (235 lb 4.8 oz)] 106.731 kg (235 lb 4.8 oz) (02/26 0611) Last BM Date: 04/02/13 General:   Alert,  Well-developed, well-nourished, pleasant and cooperative in NAD overweight Head:  Normocephalic and atraumatic. Eyes:  Sclera clear, no icterus.   Conjunctiva pink. Ears:  Normal auditory acuity. Nose:  No deformity, discharge,  or lesions. Mouth:  No deformity or lesions, dentition normal. Neck:  Supple; no masses or thyromegaly. JVP not elevated Lungs:  Clear throughout to auscultation.   No wheezes, crackles, or rhonchi. No acute distress. Heart:  Regular rate and rhythm; no murmurs, clicks, rubs,  or gallops. Abdomen:  Soft, nontender and nondistended. No masses, hepatosplenomegaly or hernias noted. Normal bowel sounds, without guarding, and without rebound.   Msk:  Symmetrical without gross deformities. Normal posture. Pulses:  No carotid, renal, femoral bruits. DP and PT symmetrical and equal Extremities:  Without clubbing or edema. Neurologic:  Alert and  oriented x4;  grossly normal neurologically. Skin:  Intact without significant lesions or rashes. Cervical Nodes:  No significant cervical adenopathy. Psych:  Alert and cooperative. Normal mood and affect.  Intake/Output from previous day: 02/25 0701 - 02/26 0700 In: 8099 [P.O.:1320; I.V.:3] Out: 3551 [Urine:3550; Stool:1] Intake/Output this shift: Total I/O In: 240 [P.O.:240] Out: 750 [Urine:750]  Lab Results:  Recent Labs  04/02/13 0025 04/02/13 0724 04/03/13 0750  WBC 5.4 5.1 6.7  HGB 10.8* 10.2* 11.1*  HCT 32.1* 30.4* 33.2*  PLT 58* 65* 83*   BMET  Recent Labs  04/02/13 0025 04/02/13 0724 04/02/13 1911 04/03/13 0750  NA 139 143  --  140  K 4.4 4.2 5.0 4.5  CL 103 103  --  100  CO2 24 24  --  26  GLUCOSE 110* 99  --  149*  BUN 54* 52*  --  55*  CREATININE 2.89* 2.79*  --  3.04*  CALCIUM 8.1* 8.4  --  8.8   LFT  Recent Labs   04/02/13 0724  PROT 7.5  ALBUMIN 3.3*  AST 20  ALT 16  ALKPHOS 84  BILITOT 0.8   PT/INR No results found for this basename: LABPROT, INR,  in the last 72 hours Hepatitis Panel No results found for this basename: HEPBSAG, HCVAB, HEPAIGM, HEPBIGM,  in the last 72 hours  Studies/Results: Dg Chest 2 View (if Patient Has Fever And/or Copd)  04/02/2013   CLINICAL DATA:  Shortness of breath.  EXAM: CHEST  2 VIEW  COMPARISON:  None.  FINDINGS: Cardiomegaly and CABG changes noted.  Lower lobe airspace disease is identified on the lateral view but difficult to localize on the frontal view.  There is no evidence of pulmonary edema, suspicious pulmonary nodule/mass, pleural effusion, or pneumothorax. No acute bony abnormalities are identified.  IMPRESSION: Lower lobe airspace disease on the lateral view likely representing pneumonia. This is difficult to localize on the frontal view.  Cardiomegaly and CABG changes.   Electronically Signed   By: Hassan Rowan M.D.   On: 04/02/2013 01:41    Assessment/Plan:  CKD stage 4  Secondary to nephrosclerosis appears stable but progressive  Will need to see outpatient nephrologist. Needs referral for AVF and transplant referral when less than 20cc/min GFR  HTN controlled  Anemia stable  Bones will need PTH measured as outpatient  Cirrhosis follow up GI  MGUS follow up Heme onc  Diuretics  Continue current outpatient diuretics monitor daily weights  Thank you for consult  LOS: 1 Hye Trawick W @TODAY @11 :24 AM

## 2013-04-03 NOTE — Progress Notes (Signed)
Discharge instructions given to patient and family, all questions answered, patient discharged by wheelchair by NT, NAD noted

## 2013-04-03 NOTE — Evaluation (Signed)
Physical Therapy Evaluation/ Discharge Patient Details Name: Joseph [name] MRN: [id number] DOB: [birth year] Today's Date: 04/03/2013 Time: 1200-1212 PT Time Calculation (min): 12 min  PT Assessment / Plan / Recommendation History of Present Illness  Joseph [name] is a 68 y.o. male with history of C. status post CABG and stenting, chronic kidney disease baseline creatinine around 2.9, anemia, diabetes mellitus, recently diagnosed hepatosplenomegaly and thrombocytopenia undergoing further workup at [facility] presents to the ER because of shortness of breath  Clinical Impression  Pt currently at baseline functional status with mobility and no further therapy needs at this time. Pt educated for energy conservation as needed and importance of following heart healthy diet and recognizing symptoms of CHF exacerbation. Pt very pleasant and moving well and he and dgtr educated for all above. Pt with pulse ox at home and encouraged use when feeling SOB. All education complete, no further needs, signing off, pt aware and agreeable.     PT Assessment  Patent does not need any further PT services    Follow Up Recommendations  No PT follow up    Does the patient have the potential to tolerate intense rehabilitation      Barriers to Discharge        Equipment Recommendations  None recommended by PT    Recommendations for Other Services     Frequency      Precautions / Restrictions Precautions Precautions: None   Pertinent Vitals/Pain HR 55-65 throughout sats 90-94% on RA with cues for breathing technique No pain      Mobility  Transfers Overall transfer level: Modified independent Ambulation/Gait Ambulation/Gait assistance: Independent Ambulation Distance (Feet): 550 Feet Assistive device: None Gait Pattern/deviations: WFL(Within Functional Limits) Gait velocity interpretation: at or above normal speed for age/gender Stairs: Yes Stairs assistance: Modified independent  (Device/Increase time) Stair Management: One rail Left Number of Stairs: 11    Exercises     PT Diagnosis:    PT Problem List:   PT Treatment Interventions:       PT Goals(Current goals can be found in the care plan section) Acute Rehab PT Goals PT Goal Formulation: No goals set, d/c therapy  Visit Information  Last PT Received On: 04/03/13 Assistance Needed: +1 History of Present Illness: Joseph [name] is a 68 y.o. male with history of C. status post CABG and stenting, chronic kidney disease baseline creatinine around 2.9, anemia, diabetes mellitus, recently diagnosed hepatosplenomegaly and thrombocytopenia undergoing further workup at [facility] presents to the ER because of shortness of breath       Prior [place] expects to be discharged to:: Private residence Living Arrangements: Alone Available Help at Discharge: Family;Available PRN/intermittently Type of Home: House Home Layout: Two level;Bed/bath upstairs Home Equipment: None Prior Function Level of Independence: Independent Communication Communication: No difficulties    Cognition  Cognition Arousal/Alertness: Awake/alert Behavior During Therapy: WFL for tasks assessed/performed Overall Cognitive Status: Within Functional Limits for tasks assessed    Extremity/Trunk Assessment Upper Extremity Assessment Upper Extremity Assessment: Overall WFL for tasks assessed Lower Extremity Assessment Lower Extremity Assessment: Overall WFL for tasks assessed Cervical / Trunk Assessment Cervical / Trunk Assessment: Normal   Balance    End of Session PT - End of Session Activity Tolerance: Patient tolerated treatment well Patient left: in chair;with call bell/phone within reach;with family/visitor present Nurse Communication: Mobility status  GP     Melford Aase 04/03/2013, 12:49 PM Elwyn Reach, [place]

## 2013-04-03 NOTE — Discharge Summary (Signed)
Joseph [name], is a 68 y.o. male  DOB [birth year]  MRN [id number].  Admission date:  04/02/2013  Admitting Physician  Rise Patience, MD  Discharge Date:  04/03/2013   Primary MD  EASON,ERNEST, MD  Recommendations for primary care physician for things to follow:   Follow BMP, dry weight and diuretic dose closely. Repeat 2 view chest x-ray in one week.  Follow glycemic control and adjust insulin dose as needed   Admission Diagnosis  CAD (coronary artery disease) [414.00] CKD (chronic kidney disease) [585.9] Acute CHF [428.0] Diabetes mellitus [250.00]   Discharge Diagnosis  CAD (coronary artery disease) [414.00] CKD (chronic kidney disease) [585.9] Acute CHF on chronic diastolic, EF preserved at 50% to 55%, history of RCA distribution fixed defect consistent with history of MI in the past. Diabetes mellitus [250.00]     Principal Problem:   Acute CHF Active Problems:   CAD (coronary artery disease)   Diabetes mellitus   CKD (chronic kidney disease)   Anemia   Thrombocytopenia   CHF (congestive heart failure)      Past Medical History  Diagnosis Date  . Hypertension   . Hypercholesterolemia   . Prostate cancer   . Renal disorder   . Enlarged liver   . Spleen enlarged   . Thrombocytopenia   . Myocardial infarction   . Anginal pain   . Anxiety   . Depression   . Shortness of breath   . Diabetes mellitus without complication     INSULIN DEPENDENT  . GERD (gastroesophageal reflux disease)   . History of shingles     Past Surgical History  Procedure Laterality Date  . Cardiac surgery    . Shoulder arthroscopy    . Penile prosthesis implant    . Myocardial infarction    . Cardiac catheterization    . Coronary angioplasty    . Coronary artery bypass graft  2001     Discharge Condition:  Stable   Follow UP  Follow-up Information   Follow up with EASON,ERNEST, MD. Schedule an appointment as soon as possible for a visit in 1 week. (and your primary Kidney and Heart MD)    Specialty:  Internal Medicine   Contact information:   [address], [place] [address] [phone]         Discharge Instructions  and  Discharge Medications     Discharge Orders   Future Orders Complete By Expires   Diet - low sodium heart healthy  As directed    Discharge instructions  As directed    Comments:     Follow with Primary MD EASON,ERNEST, MD in 7 days   Get CBC, CMP, checked 7 days by Primary MD and again as instructed by your Primary MD. Get a 2 view Chest X ray done next visit if you had Pneumonia of Lung problems at the [place] 4 times/day, Once in AM empty stomach and then before each meal. Log in all results and show them to your  Prim.MD in 3 days. If any glucose reading is under 80 or above 300 call your Prim MD immidiately. Follow Low glucose instructions for glucose under 80 as instructed.   Activity: As tolerated with Full fall precautions use walker/cane & assistance as needed   Disposition Home     Diet: Heart Healthy - Low Carb.   Check your Weight same time everyday, if you gain over 2 pounds, or you develop in leg swelling, experience more shortness of breath or chest pain, call your Primary MD immediately. Follow Cardiac Low Salt Diet and 1.5 lit/day fluid restriction.   On your next visit with her primary care physician please Get Medicines reviewed and adjusted.  Please request your Prim.MD to go over all Hospital Tests and Procedure/Radiological results at the follow up, please get all Hospital records sent to your Prim MD by signing hospital release before you go home.   If you experience worsening of your admission symptoms, develop shortness of breath, life threatening emergency, suicidal or homicidal thoughts you must  seek medical attention immediately by calling 911 or calling your MD immediately  if symptoms less severe.  You Must read complete instructions/literature along with all the possible adverse reactions/side effects for all the Medicines you take and that have been prescribed to you. Take any new Medicines after you have completely understood and accpet all the possible adverse reactions/side effects.   Do not drive and provide baby sitting services if your were admitted for syncope or siezures until you have seen by Primary MD or a Neurologist and advised to do so again.  Do not drive when taking Pain medications.    Do not take more than prescribed Pain, Sleep and Anxiety Medications  Special Instructions: If you have smoked or chewed Tobacco  in the last 2 yrs please stop smoking, stop any regular Alcohol  and or any Recreational drug use.  Wear Seat belts while driving.   Please note  You were cared for by a hospitalist during your hospital stay. If you have any questions about your discharge medications or the care you received while you were in the hospital after you are discharged, you can call the unit and asked to speak with the hospitalist on call if the hospitalist that took care of you is not available. Once you are discharged, your primary care physician will handle any further medical issues. Please note that NO REFILLS for any discharge medications will be authorized once you are discharged, as it is imperative that you return to your primary care physician (or establish a relationship with a primary care physician if you do not have one) for your aftercare needs so that they can reassess your need for medications and monitor your lab values.   Increase activity slowly  As directed        Medication List         allopurinol 100 MG tablet  Commonly known as:  ZYLOPRIM  Take 100 mg by mouth daily.     ALPRAZolam 0.5 MG tablet  Commonly known as:  XANAX  Take 0.5 mg by mouth  at bedtime.     amitriptyline 10 MG tablet  Commonly known as:  ELAVIL  Take 10 mg by mouth at bedtime.     carvedilol 12.5 MG tablet  Commonly known as:  COREG  Take 1 tablet (12.5 mg total) by mouth 2 (two) times daily with a meal.     CRESTOR 10 MG tablet  Generic drug:  rosuvastatin  Take 10 mg by mouth daily.     cyclobenzaprine 5 MG tablet  Commonly known as:  FLEXERIL  Take 1 tablet (5 mg total) by mouth 3 (three) times daily as needed for muscle spasms.     GOODY HEADACHE PO  Take 1 packet by mouth daily as needed (pain).     hydrALAZINE 50 MG tablet  Commonly known as:  APRESOLINE  Take 50 mg by mouth 2 (two) times daily.     insulin NPH-regular Human (70-30) 100 UNIT/ML injection  Commonly known as:  NOVOLIN 70/30  Inject 10-40 Units into the skin 2 (two) times daily with a meal. Inject 40 units every morning and depending on sugar, may inject and additional 10 units at bedtime.     IRON PO  Take 1 tablet by mouth daily.     LANTUS SOLOSTAR 100 UNIT/ML Solostar Pen  Generic drug:  Insulin Glargine  Inject 40 Units into the skin at bedtime.     levofloxacin 750 MG tablet  Commonly known as:  LEVAQUIN  Take 1 tablet (750 mg total) by mouth every other day.     losartan 25 MG tablet  Commonly known as:  COZAAR  Take 25 mg by mouth daily.     LYRICA 150 MG capsule  Generic drug:  pregabalin  Take 150 mg by mouth 2 (two) times daily.     torsemide 20 MG tablet  Commonly known as:  DEMADEX  Take 20 mg by mouth daily.     Vitamin D (Ergocalciferol) 50000 UNITS Caps capsule  Commonly known as:  DRISDOL  Take 50,000 Units by mouth every 30 (thirty) days.          Diet and Activity recommendation: See Discharge Instructions above   Consults obtained - renal   Major procedures and Radiology Reports - PLEASE review detailed and final reports for all details, in brief -   Echogram and Myoview results evaluated from primary cardiologist Dr. Laurelyn Sickle  office in [place]. EF was 50-55% on echogram done a few months ago. LVH consistent with chronic diastolic CHF mentioned on echogram.  RCA distribution fixed defect on Myoview   Dg Chest 2 View (if Patient Has Fever And/or Copd)  04/02/2013   CLINICAL DATA:  Shortness of breath.  EXAM: CHEST  2 VIEW  COMPARISON:  None.  FINDINGS: Cardiomegaly and CABG changes noted.  Lower lobe airspace disease is identified on the lateral view but difficult to localize on the frontal view.  There is no evidence of pulmonary edema, suspicious pulmonary nodule/mass, pleural effusion, or pneumothorax. No acute bony abnormalities are identified.  IMPRESSION: Lower lobe airspace disease on the lateral view likely representing pneumonia. This is difficult to localize on the frontal view.  Cardiomegaly and CABG changes.   Electronically Signed   By: Hassan Rowan M.D.   On: 04/02/2013 01:41    Micro Results      No results found for this or any previous visit (from the past 240 hour(s)).   History of present illness and  Hospital Course:     Kindly see H&P for history of present illness and admission details, please review complete Labs, Consult reports and Test reports for all details in brief Joseph [name], is a 69 y.o. male, patient with history of  C. status post CABG and stenting, DM type II insulin-dependent, chronic kidney disease baseline creatinine around 2.9, AOCD, recently diagnosed hepatosplenomegaly and thrombocytopenia and myeloproliferative disorder undergoing further workup at [facility] (first visit pending) presents to the ER because of shortness of breath. Patient has been having increasing shortness of breath with exertion over the last 3-4 weeks, in the ER workup was consistent with acute on chronic diastolic CHF, ruled out for MI with serial negative troponins, there was a possibility of left-sided infiltrate on lateral view possible early community-acquired pneumonia.   For his shortness of  breath caused by acute on chronic diastolic CHF with EF 39-03% and possible early community-acquired pneumonia he was treated with IV Lasix and Levaquin dosed by pharmacy with good effect, he's back to his baseline today with no shortness of breath, no cough fevers or leukocytosis, no oxygen requirement, he appears euvolemic, he was seen by renal this morning and switched to his home oral diuretic regimen, patient per daughter clearly has issues with compliance with diet and fluid restriction on which he was educated by me personally and given written instructions. He will be discharged home on few more days of oral Levaquin along with home dose diuretic, he will follow strict fluid and dietary salt restriction upon discharge.    Will request primary care physician to kindly repeat 2 view chest x-ray in a week along with CBC and BMP.     Chronic kidney disease stage IV baseline creatinine around 2.9, seen by renal here, UA suggestive of mild proteinuria, he already has a nephrologist in [place] and will continue to follow with them upon discharge, home dose diuretic resumed. Avoid nephrotoxins. Will likely require dialysis in the next few years. Cleared for discharged by renal this morning.    CAD status post CABG, recent outpatient Myoview showed changes consistent with RCA distribution fixed defect with no reversible ischemia, continue home medications, home dose Coreg was cut in half as patient's heart rate remained in low 50s, continue outpatient risk factor modulation by PCP and primary radiologist in [place].    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    Diabetes mellitus type II. Insulin requirements were much less her with dietary control, she does not practice dietary control at home, he will see a dietitian prior to discharge, has been given written instructions to do CBGs q. a.c. at bedtime  and show his Accu-Chek reading is to his PCP next visit so that his insulin regimen can be adjusted if needed.       Today   Subjective:   Joseph [name] today has no headache,no chest abdominal pain,no new weakness tingling or numbness, feels much better wants to go home today.    Objective:   Blood pressure 142/60, pulse 55, temperature 98 F (36.7 C), temperature source Oral, resp. rate 18, height 5' 10.5" (1.791 m), weight 106.731 kg (235 lb 4.8 oz), SpO2 94.00%.   Intake/Output Summary (Last 24 hours) at 04/03/13 1056 Last data filed at 04/03/13 1051  Gross per 24 hour  Intake   1200 ml  Output   4101 ml  Net  -2901 ml    Exam Awake Alert, Oriented *3, No new F.N deficits, Normal affect [place].AT,PERRAL Supple Neck,No JVD, No cervical lymphadenopathy appriciated.  Symmetrical Chest wall movement, Good air movement bilaterally, CTAB RRR,No Gallops,Rubs or new Murmurs, No Parasternal Heave +ve B.Sounds, Abd Soft, Non tender, No organomegaly appriciated, No rebound -guarding or rigidity. No Cyanosis, Clubbing or edema, No new Rash or bruise  Data Review   CBC w Diff: Lab Results  Component Value Date   WBC 6.7 04/03/2013  WBC 5.9 03/23/2008   HGB 11.1* 04/03/2013   HGB 15.8 03/23/2008   HCT 33.2* 04/03/2013   HCT 46.2 03/23/2008   PLT 83* 04/03/2013   PLT 93* 03/23/2008   LYMPHOPCT 17 04/02/2013   LYMPHOPCT 22.3 03/23/2008   MONOPCT 9 04/02/2013   MONOPCT 5.9 03/23/2008   EOSPCT 2 04/02/2013   EOSPCT 1.5 03/23/2008   BASOPCT 0 04/02/2013   BASOPCT 1.5 03/23/2008    CMP: Lab Results  Component Value Date   NA 140 04/03/2013   NA 139 02/04/2008   K 4.5 04/03/2013   K 4.3 02/04/2008   CL 100 04/03/2013   CL 102 02/04/2008   CO2 26 04/03/2013   CO2 28 02/04/2008   BUN 55* 04/03/2013   BUN 18 02/04/2008   CREATININE 3.04* 04/03/2013   CREATININE 1.3* 02/04/2008   PROT 7.5 04/02/2013   PROT 6.9 02/04/2008   ALBUMIN 3.3* 04/02/2013   BILITOT 0.8 04/02/2013   BILITOT 0.90  02/04/2008   ALKPHOS 84 04/02/2013   ALKPHOS 66 02/04/2008   AST 20 04/02/2013   AST 28 02/04/2008   ALT 16 04/02/2013   ALT 23 02/04/2008  .   Total Time in preparing paper work, data evaluation and todays exam - 35 minutes  Thurnell Lose M.D on 04/03/2013 at 10:56 AM  [place]  [phone]

## 2013-04-03 NOTE — Progress Notes (Signed)
Nutrition Education Note  RD consulted for nutrition education regarding new onset CHF.  RD provided "Low Sodium Nutrition Therapy" handout from the Academy of Nutrition and Dietetics. Reviewed patient's dietary recall. Provided examples on ways to decrease sodium intake in diet. Discouraged intake of processed foods and use of salt shaker. Encouraged fresh fruits and vegetables as well as whole grain sources of carbohydrates to maximize fiber intake. Briefly reviewed serving sizes of carbohydrate containing foods. Provided thirst quenching tips.  Provided additional handouts from the Academy of Nutrition and Dietetics per pt request: "Sodium Content of Foods List", "Heart Healthy Shopping Tips", "Sodium Free Flavoring Tips", "1800-Calorie 5-Days Sample Menus" and "Carbohydrate Counting for People with Diabetes".   RD discussed why it is important for patient to adhere to diet recommendations, and emphasized the role of fluids, foods to avoid, and importance of weighing self daily. Teach back method used. RD contact information provided. Encouraged pt to contact RD with any questions or concerns.   Expect good compliance.  Body mass index is 33.27 kg/(m^2). Pt meets criteria for Obesity based on current BMI.  Current diet order is Heart Healthy/Carb Modified, patient is consuming approximately 100% of meals at this time. Labs and medications reviewed. No further nutrition interventions warranted at this time.  If additional nutrition issues arise, please re-consult RD.   Pryor Ochoa RD, LDN Inpatient Clinical Dietitian Pager: [phone] After Hours Pager: [phone]

## 2013-04-03 NOTE — Discharge Instructions (Signed)
Follow with Primary MD EASON,ERNEST, MD in 7 days   Get CBC, CMP, checked 7 days by Primary MD and again as instructed by your Primary MD. Get a 2 view Chest X ray done next visit if you had Pneumonia of Lung problems at the [place] 4 times/day, Once in AM empty stomach and then before each meal. Log in all results and show them to your Prim.MD in 3 days. If any glucose reading is under 80 or above 300 call your Prim MD immidiately. Follow Low glucose instructions for glucose under 80 as instructed.   Activity: As tolerated with Full fall precautions use walker/cane & assistance as needed   Disposition Home     Diet: Heart Healthy - Low Carb.   Check your Weight same time everyday, if you gain over 2 pounds, or you develop in leg swelling, experience more shortness of breath or chest pain, call your Primary MD immediately. Follow Cardiac Low Salt Diet and 1.5 lit/day fluid restriction.   On your next visit with her primary care physician please Get Medicines reviewed and adjusted.  Please request your Prim.MD to go over all Hospital Tests and Procedure/Radiological results at the follow up, please get all Hospital records sent to your Prim MD by signing hospital release before you go home.   If you experience worsening of your admission symptoms, develop shortness of breath, life threatening emergency, suicidal or homicidal thoughts you must seek medical attention immediately by calling 911 or calling your MD immediately  if symptoms less severe.  You Must read complete instructions/literature along with all the possible adverse reactions/side effects for all the Medicines you take and that have been prescribed to you. Take any new Medicines after you have completely understood and accpet all the possible adverse reactions/side effects.   Do not drive and provide baby sitting services if your were admitted for syncope or siezures until you have seen by Primary MD or a  Neurologist and advised to do so again.  Do not drive when taking Pain medications.    Do not take more than prescribed Pain, Sleep and Anxiety Medications  Special Instructions: If you have smoked or chewed Tobacco  in the last 2 yrs please stop smoking, stop any regular Alcohol  and or any Recreational drug use.  Wear Seat belts while driving.   Please note  You were cared for by a hospitalist during your hospital stay. If you have any questions about your discharge medications or the care you received while you were in the hospital after you are discharged, you can call the unit and asked to speak with the hospitalist on call if the hospitalist that took care of you is not available. Once you are discharged, your primary care physician will handle any further medical issues. Please note that NO REFILLS for any discharge medications will be authorized once you are discharged, as it is imperative that you return to your primary care physician (or establish a relationship with a primary care physician if you do not have one) for your aftercare needs so that they can reassess your need for medications and monitor your lab values.

## 2013-04-04 LAB — TSH: TSH: 1.245 u[IU]/mL (ref 0.350–4.500)

## 2013-04-04 NOTE — Progress Notes (Signed)
I called to update [name] concerning his discharge appointment.  It could not be made yesterday secondary to inclement weather (office was closed).  His follow-up appointment is with Dr. Chancy Milroy in [place] on Tuesday March 3 11:45.  He says that he is doing well and has had no issues regarding his HF and had no questions.  Carole Binning RN, BSN, PCCN--Heart Failure Art therapist

## 2013-04-14 ENCOUNTER — Ambulatory Visit: Payer: Self-pay | Admitting: Vascular Surgery

## 2013-04-16 ENCOUNTER — Ambulatory Visit: Payer: Self-pay | Admitting: Vascular Surgery

## 2013-04-16 LAB — PLATELET COUNT: PLATELETS: 85 10*3/uL — AB (ref 150–440)

## 2013-04-16 LAB — POTASSIUM: Potassium: 5.9 mmol/L — ABNORMAL HIGH (ref 3.5–5.1)

## 2013-04-22 LAB — CBC
HCT: 36.4 % — ABNORMAL LOW (ref 40.0–52.0)
HGB: 11.5 g/dL — AB (ref 13.0–18.0)
MCH: 26.5 pg (ref 26.0–34.0)
MCHC: 31.7 g/dL — ABNORMAL LOW (ref 32.0–36.0)
MCV: 84 fL (ref 80–100)
Platelet: 109 10*3/uL — ABNORMAL LOW (ref 150–440)
RBC: 4.36 10*6/uL — ABNORMAL LOW (ref 4.40–5.90)
RDW: 15 % — ABNORMAL HIGH (ref 11.5–14.5)
WBC: 8.1 10*3/uL (ref 3.8–10.6)

## 2013-04-22 LAB — BASIC METABOLIC PANEL
ANION GAP: 4 — AB (ref 7–16)
BUN: 36 mg/dL — ABNORMAL HIGH (ref 7–18)
CO2: 29 mmol/L (ref 21–32)
Calcium, Total: 8.4 mg/dL — ABNORMAL LOW (ref 8.5–10.1)
Chloride: 100 mmol/L (ref 98–107)
Creatinine: 2.55 mg/dL — ABNORMAL HIGH (ref 0.60–1.30)
EGFR (African American): 29 — ABNORMAL LOW
GFR CALC NON AF AMER: 25 — AB
Glucose: 105 mg/dL — ABNORMAL HIGH (ref 65–99)
OSMOLALITY: 275 (ref 275–301)
Potassium: 3.7 mmol/L (ref 3.5–5.1)
Sodium: 133 mmol/L — ABNORMAL LOW (ref 136–145)

## 2013-04-22 LAB — TROPONIN I: TROPONIN-I: 0.03 ng/mL

## 2013-04-23 ENCOUNTER — Inpatient Hospital Stay: Payer: Self-pay | Admitting: Student

## 2013-04-23 LAB — HEPATIC FUNCTION PANEL A (ARMC)
ALK PHOS: 102 U/L
ALT: 25 U/L (ref 12–78)
Albumin: 3.9 g/dL (ref 3.4–5.0)
BILIRUBIN TOTAL: 0.8 mg/dL (ref 0.2–1.0)
Bilirubin, Direct: 0.2 mg/dL (ref 0.00–0.20)
SGOT(AST): 39 U/L — ABNORMAL HIGH (ref 15–37)
Total Protein: 8.2 g/dL (ref 6.4–8.2)

## 2013-04-23 LAB — URINALYSIS, COMPLETE
Bilirubin,UR: NEGATIVE
Glucose,UR: 50 mg/dL (ref 0–75)
Ketone: NEGATIVE
LEUKOCYTE ESTERASE: NEGATIVE
Nitrite: NEGATIVE
Ph: 5 (ref 4.5–8.0)
RBC,UR: 1 /HPF (ref 0–5)
SPECIFIC GRAVITY: 1.015 (ref 1.003–1.030)
SQUAMOUS EPITHELIAL: NONE SEEN
WBC UR: 3 /HPF (ref 0–5)

## 2013-04-23 LAB — TROPONIN I
Troponin-I: 0.02 ng/mL
Troponin-I: 0.03 ng/mL

## 2013-04-23 LAB — CK TOTAL AND CKMB (NOT AT ARMC)
CK, TOTAL: 360 U/L — AB
CK, Total: 325 U/L — ABNORMAL HIGH
CK-MB: 6.4 ng/mL — AB (ref 0.5–3.6)
CK-MB: 5.8 ng/mL — AB (ref 0.5–3.6)

## 2013-04-23 LAB — CK: CK, Total: 385 U/L — ABNORMAL HIGH

## 2013-04-23 LAB — LIPASE, BLOOD: Lipase: 1120 U/L — ABNORMAL HIGH (ref 73–393)

## 2013-04-23 LAB — MAGNESIUM: MAGNESIUM: 1.6 mg/dL — AB

## 2013-04-24 LAB — CBC WITH DIFFERENTIAL/PLATELET
Basophil #: 0 10*3/uL (ref 0.0–0.1)
Basophil %: 0.3 %
Eosinophil #: 0.1 10*3/uL (ref 0.0–0.7)
Eosinophil %: 1.3 %
HCT: 34.5 % — ABNORMAL LOW (ref 40.0–52.0)
HGB: 11.6 g/dL — ABNORMAL LOW (ref 13.0–18.0)
LYMPHS ABS: 1 10*3/uL (ref 1.0–3.6)
LYMPHS PCT: 16 %
MCH: 28.5 pg (ref 26.0–34.0)
MCHC: 33.8 g/dL (ref 32.0–36.0)
MCV: 84 fL (ref 80–100)
Monocyte #: 0.5 x10 3/mm (ref 0.2–1.0)
Monocyte %: 8.1 %
Neutrophil #: 4.6 10*3/uL (ref 1.4–6.5)
Neutrophil %: 74.3 %
Platelet: 86 10*3/uL — ABNORMAL LOW (ref 150–440)
RBC: 4.09 10*6/uL — ABNORMAL LOW (ref 4.40–5.90)
RDW: 15.2 % — AB (ref 11.5–14.5)
WBC: 6.2 10*3/uL (ref 3.8–10.6)

## 2013-04-24 LAB — PHOSPHORUS: Phosphorus: 3.4 mg/dL (ref 2.5–4.9)

## 2013-04-24 LAB — BASIC METABOLIC PANEL
ANION GAP: 7 (ref 7–16)
BUN: 40 mg/dL — ABNORMAL HIGH (ref 7–18)
Calcium, Total: 8.3 mg/dL — ABNORMAL LOW (ref 8.5–10.1)
Chloride: 106 mmol/L (ref 98–107)
Co2: 26 mmol/L (ref 21–32)
Creatinine: 3 mg/dL — ABNORMAL HIGH (ref 0.60–1.30)
EGFR (Non-African Amer.): 21 — ABNORMAL LOW
GFR CALC AF AMER: 24 — AB
Glucose: 97 mg/dL (ref 65–99)
OSMOLALITY: 287 (ref 275–301)
POTASSIUM: 3.9 mmol/L (ref 3.5–5.1)
SODIUM: 139 mmol/L (ref 136–145)

## 2013-04-24 LAB — LIPASE, BLOOD: LIPASE: 489 U/L — AB (ref 73–393)

## 2013-04-24 LAB — MAGNESIUM: MAGNESIUM: 1.8 mg/dL

## 2013-04-24 LAB — HEMOGLOBIN A1C: HEMOGLOBIN A1C: 7 % — AB (ref 4.2–6.3)

## 2013-04-25 LAB — LIPID PANEL
Cholesterol: 97 mg/dL (ref 0–200)
HDL: 33 mg/dL — AB (ref 40–60)
Ldl Cholesterol, Calc: 41 mg/dL (ref 0–100)
Triglycerides: 113 mg/dL (ref 0–200)
VLDL CHOLESTEROL, CALC: 23 mg/dL (ref 5–40)

## 2013-04-30 ENCOUNTER — Ambulatory Visit: Payer: Self-pay | Admitting: Vascular Surgery

## 2013-04-30 LAB — POTASSIUM: POTASSIUM: 3.6 mmol/L (ref 3.5–5.1)

## 2013-05-20 ENCOUNTER — Ambulatory Visit: Payer: Self-pay | Admitting: Internal Medicine

## 2013-05-21 LAB — CBC CANCER CENTER
Basophil #: 0 x10 3/mm (ref 0.0–0.1)
Basophil %: 0.7 %
EOS PCT: 1.1 %
Eosinophil #: 0 x10 3/mm (ref 0.0–0.7)
HCT: 32.8 % — ABNORMAL LOW (ref 40.0–52.0)
HGB: 10.6 g/dL — AB (ref 13.0–18.0)
Lymphocyte #: 0.8 x10 3/mm — ABNORMAL LOW (ref 1.0–3.6)
Lymphocyte %: 19.4 %
MCH: 27.2 pg (ref 26.0–34.0)
MCHC: 32.4 g/dL (ref 32.0–36.0)
MCV: 84 fL (ref 80–100)
MONO ABS: 0.3 x10 3/mm (ref 0.2–1.0)
Monocyte %: 7.1 %
NEUTROS ABS: 3 x10 3/mm (ref 1.4–6.5)
Neutrophil %: 71.7 %
Platelet: 101 x10 3/mm — ABNORMAL LOW (ref 150–440)
RBC: 3.91 10*6/uL — AB (ref 4.40–5.90)
RDW: 15.2 % — ABNORMAL HIGH (ref 11.5–14.5)
WBC: 4.2 x10 3/mm (ref 3.8–10.6)

## 2013-05-21 LAB — CREATININE, SERUM
Creatinine: 2.35 mg/dL — ABNORMAL HIGH (ref 0.60–1.30)
GFR CALC AF AMER: 32 — AB
GFR CALC NON AF AMER: 28 — AB

## 2013-05-21 LAB — CALCIUM: Calcium, Total: 8.9 mg/dL (ref 8.5–10.1)

## 2013-05-23 LAB — PROT IMMUNOELECTROPHORES(ARMC)

## 2013-05-23 LAB — KAPPA/LAMBDA FREE LIGHT CHAINS (ARMC)

## 2013-06-06 ENCOUNTER — Ambulatory Visit: Payer: Self-pay | Admitting: Internal Medicine

## 2013-06-20 ENCOUNTER — Ambulatory Visit: Payer: Self-pay | Admitting: Physician Assistant

## 2013-07-14 DIAGNOSIS — K766 Portal hypertension: Secondary | ICD-10-CM | POA: Insufficient documentation

## 2013-07-14 DIAGNOSIS — I509 Heart failure, unspecified: Secondary | ICD-10-CM | POA: Insufficient documentation

## 2013-07-14 DIAGNOSIS — K7460 Unspecified cirrhosis of liver: Secondary | ICD-10-CM | POA: Insufficient documentation

## 2013-09-22 ENCOUNTER — Ambulatory Visit: Payer: Self-pay | Admitting: Internal Medicine

## 2013-09-22 LAB — CBC CANCER CENTER
Basophil #: 0 x10 3/mm (ref 0.0–0.1)
Basophil %: 0.8 %
EOS PCT: 1.7 %
Eosinophil #: 0.1 x10 3/mm (ref 0.0–0.7)
HCT: 38.5 % — AB (ref 40.0–52.0)
HGB: 12.7 g/dL — ABNORMAL LOW (ref 13.0–18.0)
LYMPHS PCT: 21.5 %
Lymphocyte #: 1.1 x10 3/mm (ref 1.0–3.6)
MCH: 27.6 pg (ref 26.0–34.0)
MCHC: 33 g/dL (ref 32.0–36.0)
MCV: 84 fL (ref 80–100)
MONO ABS: 0.3 x10 3/mm (ref 0.2–1.0)
MONOS PCT: 6.3 %
Neutrophil #: 3.5 x10 3/mm (ref 1.4–6.5)
Neutrophil %: 69.7 %
Platelet: 98 x10 3/mm — ABNORMAL LOW (ref 150–440)
RBC: 4.61 10*6/uL (ref 4.40–5.90)
RDW: 17.1 % — AB (ref 11.5–14.5)
WBC: 5 x10 3/mm (ref 3.8–10.6)

## 2013-10-07 ENCOUNTER — Ambulatory Visit: Payer: Self-pay | Admitting: Internal Medicine

## 2013-11-30 ENCOUNTER — Emergency Department: Payer: Self-pay | Admitting: Emergency Medicine

## 2013-11-30 LAB — TROPONIN I
Troponin-I: 0.04 ng/mL
Troponin-I: 0.04 ng/mL

## 2013-11-30 LAB — COMPREHENSIVE METABOLIC PANEL
ALK PHOS: 152 U/L — AB
ALT: 36 U/L
AST: 33 U/L (ref 15–37)
Albumin: 3.6 g/dL (ref 3.4–5.0)
Anion Gap: 10 (ref 7–16)
BUN: 63 mg/dL — ABNORMAL HIGH (ref 7–18)
Bilirubin,Total: 0.7 mg/dL (ref 0.2–1.0)
CALCIUM: 7.6 mg/dL — AB (ref 8.5–10.1)
CREATININE: 3.6 mg/dL — AB (ref 0.60–1.30)
Chloride: 110 mmol/L — ABNORMAL HIGH (ref 98–107)
Co2: 20 mmol/L — ABNORMAL LOW (ref 21–32)
EGFR (African American): 22 — ABNORMAL LOW
EGFR (Non-African Amer.): 18 — ABNORMAL LOW
Glucose: 161 mg/dL — ABNORMAL HIGH (ref 65–99)
Osmolality: 301 (ref 275–301)
POTASSIUM: 5.7 mmol/L — AB (ref 3.5–5.1)
Sodium: 140 mmol/L (ref 136–145)
Total Protein: 8 g/dL (ref 6.4–8.2)

## 2013-11-30 LAB — LIPASE, BLOOD: Lipase: 320 U/L (ref 73–393)

## 2013-11-30 LAB — CBC
HCT: 33.8 % — ABNORMAL LOW (ref 40.0–52.0)
HGB: 10.9 g/dL — ABNORMAL LOW (ref 13.0–18.0)
MCH: 28.9 pg (ref 26.0–34.0)
MCHC: 32.3 g/dL (ref 32.0–36.0)
MCV: 90 fL (ref 80–100)
PLATELETS: 87 10*3/uL — AB (ref 150–440)
RBC: 3.78 10*6/uL — ABNORMAL LOW (ref 4.40–5.90)
RDW: 14.3 % (ref 11.5–14.5)
WBC: 8 10*3/uL (ref 3.8–10.6)

## 2013-12-08 ENCOUNTER — Encounter: Payer: Self-pay | Admitting: General Surgery

## 2013-12-08 ENCOUNTER — Ambulatory Visit (INDEPENDENT_AMBULATORY_CARE_PROVIDER_SITE_OTHER): Payer: Medicare Other | Admitting: General Surgery

## 2013-12-08 VITALS — BP 134/76 | HR 72 | Resp 12 | Ht 70.0 in | Wt 211.0 lb

## 2013-12-08 DIAGNOSIS — I2510 Atherosclerotic heart disease of native coronary artery without angina pectoris: Secondary | ICD-10-CM

## 2013-12-08 DIAGNOSIS — N183 Chronic kidney disease, stage 3 unspecified: Secondary | ICD-10-CM

## 2013-12-08 DIAGNOSIS — K8020 Calculus of gallbladder without cholecystitis without obstruction: Secondary | ICD-10-CM

## 2013-12-08 DIAGNOSIS — D696 Thrombocytopenia, unspecified: Secondary | ICD-10-CM

## 2013-12-08 NOTE — Progress Notes (Signed)
Patient ID: Joseph [name], male   DOB: 27-Jun-1945, 68 y.o.   MRN: [id number]  Chief Complaint  Patient presents with  . Other    gallbladder    HPI Joseph [name] is a 68 y.o. male here today for a evalaution of his gallbladder..Patient started having abdominal pain again on 11/29/13 through it was gas. Patient was seen in the ER on 11/30/13 and had a ct scan and ultrasound.  He states he has been having abdominal pain since May 2015. The pain is located right upper quadrant and moves to his right lower rib and last for about  5 hours.  HPI  Past Medical History  Diagnosis Date  . Hypertension   . Hypercholesterolemia   . Prostate cancer   . Renal disorder   . Enlarged liver   . Spleen enlarged   . Thrombocytopenia   . Myocardial infarction   . Anginal pain   . Anxiety   . Depression   . Shortness of breath   . Diabetes mellitus without complication     INSULIN DEPENDENT  . GERD (gastroesophageal reflux disease)   . History of shingles   . CHF (congestive heart failure)     Past Surgical History  Procedure Laterality Date  . Cardiac surgery    . Shoulder arthroscopy    . Penile prosthesis implant    . Myocardial infarction    . Cardiac catheterization    . Coronary angioplasty    . Coronary artery bypass graft  2001  . Colonoscopy    . Liver biopsy      Family History  Problem Relation Age of Onset  . Acute myelogenous leukemia Brother   . CAD Brother   . Diabetes Mellitus II Brother     Social History History  Substance Use Topics  . Smoking status: Former Smoker    Quit date: 02/07/2008  . Smokeless tobacco: Former Systems developer  . Alcohol Use: No    Allergies  Allergen Reactions  . Sulfa Antibiotics     hives    Current Outpatient Prescriptions  Medication Sig Dispense Refill  . allopurinol (ZYLOPRIM) 100 MG tablet Take 100 mg by mouth daily.     [name] ALPRAZolam (XANAX) 0.5 MG tablet Take 0.5 mg by mouth at bedtime.     [name] amitriptyline (ELAVIL) 10 MG  tablet Take 10 mg by mouth at bedtime.    . carvedilol (COREG) 12.5 MG tablet Take 1 tablet (12.5 mg total) by mouth 2 (two) times daily with a meal. 60 tablet 0  . CRESTOR 10 MG tablet Take 10 mg by mouth daily.     . cyclobenzaprine (FLEXERIL) 5 MG tablet Take 1 tablet (5 mg total) by mouth 3 (three) times daily as needed for muscle spasms. 15 tablet 0  . hydrALAZINE (APRESOLINE) 50 MG tablet Take 50 mg by mouth 2 (two) times daily.     . insulin NPH-regular Human (NOVOLIN 70/30) (70-30) 100 UNIT/ML injection Inject 10-40 Units into the skin 2 (two) times daily with a meal. Inject 40 units every morning and depending on sugar, may inject and additional 10 units at bedtime.    . IRON PO Take 1 tablet by mouth daily.    [name] LANTUS SOLOSTAR 100 UNIT/ML Solostar Pen Inject 40 Units into the skin at bedtime.     [name] levofloxacin (LEVAQUIN) 750 MG tablet Take 1 tablet (750 mg total) by mouth every other day. 4 tablet 0  . losartan (COZAAR) 25 MG tablet  Take 25 mg by mouth daily.     [name] LYRICA 75 MG capsule   0  . torsemide (DEMADEX) 20 MG tablet Take 20 mg by mouth daily.     . Vitamin D, Ergocalciferol, (DRISDOL) 50000 UNITS CAPS capsule Take 50,000 Units by mouth every 30 (thirty) days.     No current facility-administered medications for this visit.    Review of Systems Review of Systems  Constitutional: Negative.   Respiratory: Negative.   Cardiovascular: Negative.   Gastrointestinal: Negative.     Blood pressure 134/76, pulse 72, resp. rate 12, height 5\' 10"  (1.778 m), weight 211 lb (95.709 kg).  Physical Exam Physical Exam  Constitutional: He is oriented to person, place, and time. He appears well-developed and well-nourished.  Eyes: Conjunctivae are normal. No scleral icterus.  Neck: Neck supple.  Cardiovascular: Normal rate, regular rhythm and normal heart sounds.   Pulmonary/Chest: Effort normal and breath sounds normal.  Abdominal: Soft. Bowel sounds are normal. There is no  tenderness.  Lymphadenopathy:    He has no cervical adenopathy.  Neurological: He is alert and oriented to person, place, and time.  Skin: Skin is warm and dry.    Data Reviewed Hematology notes of September 2015. Phone conversation with Leia Alf, MD. ED records and labs/ imaging results of October 2015.  Assessment    Symptomatic gallstones. Renal insufficiency. Past history of CHF requiring dialysis.     Plan    Dr. Ma Hillock reports patient's platelet function is normal, Platelet count of 87, 000 should be adequate for surgery. Moderate hypokalemia in ED. Will need to have K+ repeated preop.        PCP:  Henderson [place] 12/09/2013, 9:26 PM

## 2013-12-08 NOTE — Patient Instructions (Signed)

## 2013-12-09 DIAGNOSIS — K8020 Calculus of gallbladder without cholecystitis without obstruction: Secondary | ICD-10-CM | POA: Insufficient documentation

## 2013-12-16 ENCOUNTER — Telehealth: Payer: Self-pay

## 2013-12-16 NOTE — Telephone Encounter (Signed)
Spoke with patient about scheduling his gallbladder surgery. Patient is scheduled for surgery at [facility] on 01/09/14. He will pre admit at the hospital on 12/31/13 @ 9:00 am. Patient is aware of dates, time, and instructions. Pre admit paperwork has been mailed to him.

## 2013-12-24 ENCOUNTER — Encounter: Payer: Self-pay | Admitting: General Surgery

## 2013-12-24 ENCOUNTER — Ambulatory Visit (INDEPENDENT_AMBULATORY_CARE_PROVIDER_SITE_OTHER): Payer: Medicare Other | Admitting: General Surgery

## 2013-12-24 ENCOUNTER — Other Ambulatory Visit: Payer: Self-pay | Admitting: General Surgery

## 2013-12-24 VITALS — BP 130/70 | HR 60 | Resp 12 | Ht 70.0 in | Wt 208.0 lb

## 2013-12-24 DIAGNOSIS — I2510 Atherosclerotic heart disease of native coronary artery without angina pectoris: Secondary | ICD-10-CM

## 2013-12-24 DIAGNOSIS — K8020 Calculus of gallbladder without cholecystitis without obstruction: Secondary | ICD-10-CM

## 2013-12-24 NOTE — Progress Notes (Addendum)
I spoke with the patient's nephrologist, Lavonia Dana, M.D. Regarding plans for elective cholecystectomy. He confirmed that the patient's creatinine had been 7 in the past and then significantly improved for no apparent reason.  There is no contraindication to proceeding, obviously hypotension should be avoided.  Records from [facility] from June 2015 were obtained in regards to the patient's liver biopsy. Diffuse mild sinusoidal congestion and fibrous occlusion of some central veins were noted. Extramedullary hematopoiesis was noted. Some bridging fibrosis appreciated.  Colonoscopy showed evidence of an adenomatous polyp. No evidence of colitis.  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  ECG dated 11/30/2013 showed normal sinus rhythm, old inferior infarct.  Laboratory studies of the same date showed a hemoglobin of 10.9 with a white blood cell count of 8000, platelet count of 87,000, creatinine of 3.6, potassium 5.7, alkaline phosphatase 152.  Chest x-ray showed cardiac enlargement without evidence of active cardiopulmonary disease on 11/30/2013.  09/24/2013 medical oncology/hematology note showed his hemoglobin at 12.7 with a platelet count 98,000. 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

## 2013-12-24 NOTE — Progress Notes (Signed)
Patient ID: Joseph [name], male   DOB: 1945/05/07, 68 y.o.   MRN: [id number]  Chief Complaint  Patient presents with  . Pre-op Exam    cholecystectomy    HPI Joseph [name] is a 68 y.o. male here today for his pre op Cholecystectomy scheduled for 01/09/14. HPI  Past Medical History  Diagnosis Date  . Hypertension   . Hypercholesterolemia   . Prostate cancer   . Renal disorder   . Enlarged liver   . Spleen enlarged   . Thrombocytopenia   . Myocardial infarction   . Anginal pain   . Anxiety   . Depression   . Shortness of breath   . Diabetes mellitus without complication     INSULIN DEPENDENT  . GERD (gastroesophageal reflux disease)   . History of shingles   . CHF (congestive heart failure)   . Dialysis patient     currently on hold- Nov 2015    Past Surgical History  Procedure Laterality Date  . Cardiac surgery    . Shoulder arthroscopy    . Penile prosthesis implant    . Myocardial infarction    . Cardiac catheterization    . Coronary angioplasty    . Coronary artery bypass graft  2001  . Colonoscopy    . Liver biopsy    . Av fistula placement Left 2015    Family History  Problem Relation Age of Onset  . Acute myelogenous leukemia Brother   . CAD Brother   . Diabetes Mellitus II Brother     Social History History  Substance Use Topics  . Smoking status: Former Smoker    Quit date: 02/07/2008  . Smokeless tobacco: Former Systems developer  . Alcohol Use: No    Allergies  Allergen Reactions  . Sulfa Antibiotics Itching    hives    Current Outpatient Prescriptions  Medication Sig Dispense Refill  . allopurinol (ZYLOPRIM) 100 MG tablet Take 100 mg by mouth daily.     [name] ALPRAZolam (XANAX) 0.5 MG tablet Take 0.5 mg by mouth at bedtime.     [name] amitriptyline (ELAVIL) 10 MG tablet Take 10 mg by mouth at bedtime.    . carvedilol (COREG) 12.5 MG tablet Take 1 tablet (12.5 mg total) by mouth 2 (two) times daily with a meal. 60 tablet 0  . CRESTOR 10 MG tablet Take  10 mg by mouth daily.     . cyclobenzaprine (FLEXERIL) 5 MG tablet Take 1 tablet (5 mg total) by mouth 3 (three) times daily as needed for muscle spasms. 15 tablet 0  . hydrALAZINE (APRESOLINE) 50 MG tablet Take 50 mg by mouth 2 (two) times daily.     . insulin NPH-regular Human (NOVOLIN 70/30) (70-30) 100 UNIT/ML injection Inject 10-40 Units into the skin 2 (two) times daily with a meal. Inject 40 units every morning and depending on sugar, may inject and additional 10 units at bedtime.    . IRON PO Take 1 tablet by mouth daily.    [name] LANTUS SOLOSTAR 100 UNIT/ML Solostar Pen Inject 40 Units into the skin at bedtime.     [name] levofloxacin (LEVAQUIN) 750 MG tablet Take 1 tablet (750 mg total) by mouth every other day. 4 tablet 0  . losartan (COZAAR) 25 MG tablet Take 25 mg by mouth daily.     [name] LYRICA 75 MG capsule   0  . torsemide (DEMADEX) 20 MG tablet Take 20 mg by mouth daily.     . Vitamin  D, Ergocalciferol, (DRISDOL) 50000 UNITS CAPS capsule Take 50,000 Units by mouth every 30 (thirty) days.     No current facility-administered medications for this visit.    Review of Systems Review of Systems  Constitutional: Negative.   Respiratory: Negative.   Cardiovascular: Negative.     Blood pressure 130/70, pulse 60, resp. rate 12, height 5\' 10"  (1.778 m), weight 208 lb (94.348 kg).  Physical Exam Physical Exam  Constitutional: He appears well-developed and well-nourished.  Neck: Neck supple.  Cardiovascular: Normal rate, regular rhythm and normal heart sounds.   Pulmonary/Chest: Effort normal and breath sounds normal.  Abdominal: Soft. Normal appearance and bowel sounds are normal. There is no hepatosplenomegaly. There is no tenderness.  Lymphadenopathy:    He has no cervical adenopathy.  Skin:  AV fistula left lower arm    Data Reviewed CT of the abdomen and pelvis dated August 2015 was reviewed. No ductal dilatation. No significant hepatomegaly. Mild splenomegaly.    Assessment     Cholelithiasis, recent episode of pancreatitis.     Plan    Consultation has been obtained with medical oncology and nephrology (informal). No contraindication to planned cholecystectomy.  Laparoscopic Cholecystectomy with Intraoperative Cholangiogram. The procedure, including it's potential risks and complications (including but not limited to infection, bleeding, injury to intra-abdominal organs or bile ducts, bile leak, poor cosmetic result, sepsis and death) were discussed with the patient in detail. Non-operative options, including their inherent risks (acute calculous cholecystitis with possible choledocholithiasis or gallstone pancreatitis, with the risk of ascending cholangitis, sepsis, and death) were discussed as well. The patient expressed and understanding of what we discussed and wishes to proceed with laparoscopic cholecystectomy. The patient further understands that if it is technically not possible, or it is unsafe to proceed laparoscopically, that I will convert to an open cholecystectomy.     PCP/Ref:  Mikeal Hawthorne. Laverle Hobby 12/24/2013, 2:11 PM

## 2013-12-24 NOTE — Patient Instructions (Signed)

## 2013-12-31 ENCOUNTER — Encounter: Payer: Self-pay | Admitting: General Surgery

## 2013-12-31 ENCOUNTER — Ambulatory Visit: Payer: Self-pay | Admitting: General Surgery

## 2013-12-31 LAB — BASIC METABOLIC PANEL
Anion Gap: 5 — ABNORMAL LOW (ref 7–16)
BUN: 99 mg/dL — ABNORMAL HIGH (ref 7–18)
Calcium, Total: 8.1 mg/dL — ABNORMAL LOW (ref 8.5–10.1)
Chloride: 110 mmol/L — ABNORMAL HIGH (ref 98–107)
Co2: 22 mmol/L (ref 21–32)
Creatinine: 3.67 mg/dL — ABNORMAL HIGH (ref 0.60–1.30)
EGFR (African American): 22 — ABNORMAL LOW
EGFR (Non-African Amer.): 18 — ABNORMAL LOW
Glucose: 163 mg/dL — ABNORMAL HIGH (ref 65–99)
Osmolality: 308 (ref 275–301)
Potassium: 6.7 mmol/L (ref 3.5–5.1)
SODIUM: 137 mmol/L (ref 136–145)

## 2013-12-31 LAB — CBC WITH DIFFERENTIAL/PLATELET
BASOS PCT: 0.8 %
Basophil #: 0.1 10*3/uL (ref 0.0–0.1)
EOS ABS: 0.1 10*3/uL (ref 0.0–0.7)
Eosinophil %: 2.1 %
HCT: 38.6 % — AB (ref 40.0–52.0)
HGB: 12.8 g/dL — ABNORMAL LOW (ref 13.0–18.0)
Lymphocyte #: 1.1 10*3/uL (ref 1.0–3.6)
Lymphocyte %: 17.5 %
MCH: 29.6 pg (ref 26.0–34.0)
MCHC: 33.2 g/dL (ref 32.0–36.0)
MCV: 89 fL (ref 80–100)
Monocyte #: 0.4 x10 3/mm (ref 0.2–1.0)
Monocyte %: 6.5 %
NEUTROS PCT: 73.1 %
Neutrophil #: 4.8 10*3/uL (ref 1.4–6.5)
Platelet: 88 10*3/uL — ABNORMAL LOW (ref 150–440)
RBC: 4.33 10*6/uL — AB (ref 4.40–5.90)
RDW: 15 % — ABNORMAL HIGH (ref 11.5–14.5)
WBC: 6.5 10*3/uL (ref 3.8–10.6)

## 2014-01-05 ENCOUNTER — Ambulatory Visit: Payer: Self-pay | Admitting: Internal Medicine

## 2014-01-05 LAB — CBC WITH DIFFERENTIAL/PLATELET
Basophil #: 0 10*3/uL (ref 0.0–0.1)
Basophil %: 0.5 %
EOS PCT: 1 %
Eosinophil #: 0.1 10*3/uL (ref 0.0–0.7)
HCT: 37.9 % — ABNORMAL LOW (ref 40.0–52.0)
HGB: 12.6 g/dL — ABNORMAL LOW (ref 13.0–18.0)
LYMPHS ABS: 1.2 10*3/uL (ref 1.0–3.6)
LYMPHS PCT: 14 %
MCH: 29.4 pg (ref 26.0–34.0)
MCHC: 33.2 g/dL (ref 32.0–36.0)
MCV: 89 fL (ref 80–100)
Monocyte #: 0.4 x10 3/mm (ref 0.2–1.0)
Monocyte %: 5.2 %
Neutrophil #: 6.5 10*3/uL (ref 1.4–6.5)
Neutrophil %: 79.3 %
Platelet: 93 10*3/uL — ABNORMAL LOW (ref 150–440)
RBC: 4.27 10*6/uL — ABNORMAL LOW (ref 4.40–5.90)
RDW: 15.5 % — ABNORMAL HIGH (ref 11.5–14.5)
WBC: 8.3 10*3/uL (ref 3.8–10.6)

## 2014-01-05 LAB — BASIC METABOLIC PANEL
ANION GAP: 10 (ref 7–16)
BUN: 88 mg/dL — AB (ref 7–18)
CHLORIDE: 112 mmol/L — AB (ref 98–107)
Calcium, Total: 7.9 mg/dL — ABNORMAL LOW (ref 8.5–10.1)
Co2: 21 mmol/L (ref 21–32)
Creatinine: 3.47 mg/dL — ABNORMAL HIGH (ref 0.60–1.30)
EGFR (Non-African Amer.): 19 — ABNORMAL LOW
GFR CALC AF AMER: 23 — AB
Glucose: 104 mg/dL — ABNORMAL HIGH (ref 65–99)
OSMOLALITY: 312 (ref 275–301)
POTASSIUM: 5.5 mmol/L — AB (ref 3.5–5.1)
Sodium: 143 mmol/L (ref 136–145)

## 2014-01-06 ENCOUNTER — Telehealth: Payer: Self-pay

## 2014-01-06 NOTE — Telephone Encounter (Signed)
Message received from Dr Bary Castilla that the patient's surgery will need to be cancelled for now due to elevated potassium levels and abnormal labs. The patient is scheduled to follow up with his kidney doctor, Dr Juleen China, on 01/08/14 and he will address these issues then. Spoke with patient about this and he is aware and will follow up with Dr Juleen China as scheduled. OR has been notified of the cancellation.

## 2014-01-09 ENCOUNTER — Ambulatory Visit: Admit: 2014-01-09 | Disposition: A | Discharge status: Home / Self Care | Payer: Self-pay | Admitting: General Surgery

## 2014-02-02 ENCOUNTER — Telehealth: Payer: Self-pay | Admitting: *Deleted

## 2014-02-02 NOTE — Telephone Encounter (Signed)
Patient spoke with Caryl-Lyn this afternoon. He states that insurance will be the same starting 02-06-14.   This patient wishes to wait a few days before we schedule office visit or surgery due to a head cold. He is aware to call the office when ready to proceed.   Patient's chart is in pending surgery red folder when he calls back to reschedule.

## 2014-02-02 NOTE — Telephone Encounter (Signed)
Message for patient to call the office.  We can get patient's gallbladder surgery rescheduled for January. Patient will need a pre-op visit with Dr. Bary Castilla but he should not have to pre-admit at [facility] again. (Renal clearance from Dr. Juleen China is in patient's chart.)  Note: Verify insurance will be the same starting 02-06-14 with patient.

## 2014-03-02 ENCOUNTER — Ambulatory Visit: Payer: Self-pay | Admitting: Internal Medicine

## 2014-04-03 ENCOUNTER — Other Ambulatory Visit: Payer: Self-pay | Admitting: Physician Assistant

## 2014-04-22 ENCOUNTER — Observation Stay: Payer: Self-pay | Admitting: Internal Medicine

## 2014-05-26 ENCOUNTER — Ambulatory Visit
Admit: 2014-05-26 | Disposition: A | Discharge status: Home / Self Care | Payer: Self-pay | Attending: Internal Medicine | Admitting: Internal Medicine

## 2014-05-26 LAB — CBC CANCER CENTER
Basophil #: 0 x10 3/mm (ref 0.0–0.1)
Basophil %: 0.5 %
EOS PCT: 1.6 %
Eosinophil #: 0.1 x10 3/mm (ref 0.0–0.7)
HCT: 30.6 % — ABNORMAL LOW (ref 40.0–52.0)
HGB: 10.2 g/dL — ABNORMAL LOW (ref 13.0–18.0)
LYMPHS ABS: 0.7 x10 3/mm — AB (ref 1.0–3.6)
Lymphocyte %: 14.2 %
MCH: 28.3 pg (ref 26.0–34.0)
MCHC: 33.4 g/dL (ref 32.0–36.0)
MCV: 85 fL (ref 80–100)
MONOS PCT: 6.4 %
Monocyte #: 0.3 x10 3/mm (ref 0.2–1.0)
Neutrophil #: 3.6 x10 3/mm (ref 1.4–6.5)
Neutrophil %: 77.3 %
PLATELETS: 80 x10 3/mm — AB (ref 150–440)
RBC: 3.61 10*6/uL — AB (ref 4.40–5.90)
RDW: 15.7 % — ABNORMAL HIGH (ref 11.5–14.5)
WBC: 4.7 x10 3/mm (ref 3.8–10.6)

## 2014-05-26 LAB — CREATININE, SERUM
Creatinine: 3.52 mg/dL — ABNORMAL HIGH
GFR CALC AF AMER: 20 — AB
GFR CALC NON AF AMER: 17 — AB

## 2014-05-26 LAB — CALCIUM: Calcium, Total: 6.9 mg/dL — CL

## 2014-05-28 LAB — PROT IMMUNOELECTROPHORES(ARMC)

## 2014-05-28 LAB — KAPPA/LAMBDA FREE LIGHT CHAINS (ARMC)

## 2014-05-30 NOTE — H&P (Signed)
PATIENT NAME:  Joseph [name], FAN MR#:  [id number] DATE OF BIRTH:  1946/01/29  DATE OF ADMISSION:  04/23/2013  REFERRING PHYSICIAN:  Dr. Lurline Hare.   PRIMARY CARE PHYSICIAN:  Dr. Nicky Pugh.  PRIMARY NEPHROLOGY:  Dr. Juleen China.   PRIMARY CARDIOLOGIST:  Dr. Neoma Laming.   HEPATOLOGIST:  At [facility] Dr. Edwyna Ready.   CHIEF COMPLAINT:  Epigastric pain.   HISTORY OF PRESENT ILLNESS:  This is a 69 year old male with recent diagnosis of end-stage renal disease, started hemodialysis for the last week, he had three sessions, yesterday was his third session, the patient reports after hemodialysis episode he started to have the cramping, and epigastric pain and discomfort, he denies any nausea, any vomiting, any shortness of breath, any sweating, any palpitation.  No weakness.  No dizziness, reports his symptoms has continued which prompted him to come to ED, in the ED, the patient had basic work-up done which did show him having elevated lipase, and by reviewing the patient's old records it seems he had ultrasound of the abdomen done in Apr 18, 2022 which did show gallstones, but no evidence of cholecystitis, ultrasound of abdomen it is still pending, hospitalist service requested to admit the patient, there is some documentation that the patient had chest pain upon presentation, the patient reports he never had chest pain.  It was mainly epigastric pain.  As well, he reports he is known to have history of diabetes, but he has been off his insulin over the last 6 to 7 days as his blood sugar has been controlled and it has remained controlled after that, as well the patient reports his torsemide has been stopped by his PCP yesterday due to his generalized weakness, as well he reports he had significant weight loss after he started hemodialysis where he lost more than 15 pounds, and he is being followed by Dr. Ma Hillock for abnormal lymph nodes where he had a negative PET scan last month.  PAST MEDICAL HISTORY: 1.  End-stage  renal disease on hemodialysis.  2.  History of liver cirrhosis, being worked up at [facility].  The patient reports he is supposed to have a liver biopsy at [facility] in two weeks for possible diagnosis of amyloidosis.  3.  Diabetes, currently off medication.  4.  Hypertension.  5.  Coronary artery disease, status post CABG.  6.  End stage renal disease, on hemodialysis Tuesday, Thursday, Saturday.  7.  Hyperlipidemia.    PAST SURGICAL HISTORY:  CABG.   ALLERGIES:  SULFA, WELCHOL.   FAMILY HISTORY:  Significant for leukemia in his brother, died last 18-Apr-2022.   SOCIAL HISTORY:  No alcohol.  He quit smoking for seven years.  No drugs.  HOME MEDICATIONS:   1.  The patient has not been taking insulin or Humulin for the last seven days as his blood sugar has been controlled.  3.  Crestor 10 mg oral daily.  4.  Xanax 0.5 mg at bedtime as needed.  5.  Torsemide 20 mg oral which is supposed to be stopped today.  6.  Ferrous sulfate 325 mg oral daily.  7.  Hydralazine 50 mg oral 2 times a day.  8.  Vitamin D 50,000 international units once a month.  9.  Calcium acetate 667 mg 3 times a day before meals.   REVIEW OF SYSTEMS:  CONSTITUTIONAL:  Denies fever, chills.  Complains of fatigue, weakness.  EYES:  Denies blurry vision, double vision, inflammation, glaucoma.  EARS, NOSE, THROAT:  Denies tinnitus, ear pain, hearing loss,  epistaxis or discharge.  RESPIRATORY:  Denies cough, wheezing, hemoptysis, dyspnea.  CARDIOVASCULAR:  Denies edema, arrhythmia, palpitation, syncope.  He denies chest pain.  His main complaints was epigastric pain.  GASTROINTESTINAL:  Denies nausea, vomiting, diarrhea, constipation, hematemesis, melena.  Reports abdominal pain.  GENITOURINARY:  Denies dysuria, hematuria, renal colic.  Reports he is still making urine.   ENDOCRINE:  Denies polyuria, polydipsia, heat or cold intolerance.  HEMATOLOGY:  Denies anemia, easy bruising, bleeding diathesis.  INTEGUMENT:  Denies acne, rash  or skin lesion.  MUSCULOSKELETAL:  Denies any gout, arthritis.  Reports cramps.  NEUROLOGIC:  Denies CVA, TIA, vertigo, tremor.  PSYCHIATRIC:  Denies anxiety, insomnia or bipolar disorder.   PHYSICAL EXAMINATION: VITAL SIGNS:  Temperature is 97.9, pulse 70, respiratory rate 20, blood pressure 126/77, saturating 96% on room air.  GENERAL:  Well-nourished male who looks comfortable in bed, in no apparent distress.  HEENT:  Head atraumatic, normocephalic.  Pupils equal, reactive to light.  Pink conjunctivae.  Anicteric sclerae.  Moist oral mucosa.  NECK:  Supple.  No thyromegaly.  No JVD.  No carotid bruits.  CHEST:  Good air entry bilaterally.  No wheezing, rales or rhonchi.  Has right upper chest Perm-A-cath.  CARDIOVASCULAR:  S1, S2 heard.  No rubs, murmur, gallops.  ABDOMEN:  Soft, nontender, nondistended.  Bowel sounds present.  EXTREMITIES:  No edema.  No clubbing.  No cyanosis.  Pedal pulses +2 bilaterally.  PSYCHIATRIC:  Appropriate affect.  Awake, alert x 3.  Intact judgment and insight.  NEUROLOGIC:  Cranial nerves grossly intact.  Motor five out of five.  No focal deficits.  MUSCULOSKELETAL:  No joint effusion or erythema.   PERTINENT LABORATORY DATA:  Glucose 105, BUN 36, creatinine 2.55, sodium 133, potassium 3.7, chloride 100, CO2 29, lipase 1120, magnesium 1.6.  White blood cells 8.1, hemoglobin 11.5, hematocrit 36.4, platelets 109.  Troponin 0.03 first one, second one 0.02.  EKG showing normal sinus rhythm at 74 beats per minute.   ASSESSMENT AND PLAN: 1.  Acute pancreatitis, the patient denies any history of alcohol abuse, he had recent ultrasound done which did show gallstones, he denies any jaundice, we will proceed with repeating his ultrasound of right upper quadrant to see if there is any gallstones contributing to the acute pancreatitis, we will check lipid panel, he denies any significant nausea and vomiting so we will have him on clear liquid diet.  We will have him on as  needed nausea and pain medicine, we will check lipid panel as well, we will have him on gentle hydration given his end-stage renal disease.  We will give him 250 normal saline fluid bolus, we will keep him 100 mL per hour, total of 1 liter and then we will have the team re-evaluate and we will consider renewing his fluids.  2.  End-stage renal disease.  We will consult nephrology to continue patient's hemodialysis.  3.  Hypertension.  Blood pressure is acceptable.  Continue with home meds.  4.  History of diabetes.  Currently, the patient is off medications.  We will monitor fingersticks before meals.  5.  History of coronary artery disease.  The patient denies any chest pain, what he had is epigastric pain.  He had troponins x 2 already.  He is on aspirin.  He is on statin already.  6.  Hyperlipidemia.  Continue with statin.  7.  Deep vein thrombosis prophylaxis.  SubQ heparin.  8.  CODE STATUS:  THE PATIENT REPORTS HE IS  A FULL CODE.  He has a LIVING WILL and his daughter, she is his healthcare power of attorney.   Total time spent on admission and patient care 55 minutes.    ____________________________ Albertine Patricia, MD dse:ea D: 04/23/2013 02:53:25 ET T: 04/23/2013 05:12:12 ET JOB#: [id number]  cc: Albertine Patricia, MD, <Dictator> Jaiveon Suppes Graciela Husbands MD ELECTRONICALLY SIGNED 04/28/2013 1:51

## 2014-05-30 NOTE — Op Note (Signed)
PATIENT NAME:  Joseph [name], Joseph [name] MR#:  [id number] DATE OF BIRTH:  1945/11/21  DATE OF PROCEDURE:  04/30/2013  PREOPERATIVE DIAGNOSES: 1.  End-stage renal disease.  2.  Hypertension.   POSTOPERATIVE DIAGNOSES: 1.  End-stage renal disease.  2.  Hypertension.   PROCEDURE:  Left radiocephalic arteriovenous fistula creation.   SURGEON: Leotis Pain, MD.   ANESTHESIA: General.   ESTIMATED BLOOD LOSS: 25 mL.   INDICATION FOR PROCEDURE: A 69 year old white male who has recently progressed from chronic kidney disease to end-stage renal failure. He had a PermCath placed about 2 weeks ago. He now needs a permanent dialysis access and an AV fistula is planned today. This was actually planned at his last admission, but his potassium was prohibitively high from anesthesia and a PermCath had to be placed. Risks and benefits were discussed. Informed consent was obtained.   DESCRIPTION OF PROCEDURE: The patient is brought to the operative suite and after an adequate level of general anesthesia was obtained, the left upper extremity was sterilely prepped and draped and a sterile surgical field was created. He had an easy visualized cephalic vein and an incision between that and the palpable radial artery were created just above the wrist. The cephalic vein was dissected out, marked for orientation. Side branches were ligated and divided between silk ties. There was a large medial branch and an even larger posterior branch of the cephalic vein and it was this junction that we created our anastomosis to make a large fishmouth anastomosis. The radial artery was dissected out and encircled with vessel loops proximally and distally and 3000 units of intravenous heparin were given. An anterior wall arteriotomy was created with a #11 blade and extended with Potts scissors. The vein was then cut and beveled to an appropriate size to match the arteriotomy and ligated distally and anastomosis was created with a running 6-0  Prolene suture in the usual fashion. The vessel was flushed and de-aired prior to releasing control. Two 6-0 Prolene patch sutures were used for hemostasis and hemostasis was achieved.  Some topical Papavarine was given for local vasospasm and at the conclusion there was a nice thrill within the AV fistula. Surgicel was placed. The wound was closed with a running 3-0 Vicryl and 4-0 Monocryl. Dermabond was placed as a dressing. The patient tolerated the procedure well and was taken to the recovery room in stable condition.   ____________________________ Algernon Huxley, MD jsd:ce D: 04/30/2013 14:10:04 ET T: 04/30/2013 20:16:17 ET JOB#: [id number]  cc: Algernon Huxley, MD, <Dictator> Mamie Levers, MD Algernon Huxley MD ELECTRONICALLY SIGNED 05/12/2013 9:50

## 2014-05-30 NOTE — Op Note (Signed)
PATIENT NAME:  Joseph [name], Joseph [name] MR#:  [id number] DATE OF BIRTH:  Nov 04, 1945  DATE OF PROCEDURE:  06/20/2013  PREOPERATIVE DIAGNOSIS: End-stage renal disease with functional permanent dialysis access and no longer needing PermCath.   POSTOPERATIVE DIAGNOSIS: End-stage renal disease with functional permanent dialysis access and no longer needing PermCath.   PROCEDURE PERFORMED:  Removal of right jugular PermCath.   SURGEON:  Melvina, PA-C.   ANESTHESIA:  Local.   ESTIMATED BLOOD LOSS:  Minimal.   INDICATION FOR THE PROCEDURE:  The patient is a 69 year old male with end-stage renal disease. His access is functional and he no longer needs PermCath; therefore, this will be removed.   DESCRIPTION OF THE PROCEDURE:  The patient is brought to the vascular interventional radiology area and positioned supine. The right neck and chest and existing catheter were sterilely prepped and draped and a sterile surgical field was created. The area was locally anesthetized copiously with 1% lidocaine. Hemostats were used to help dissect out the cuff. An 11 blade was used to transect the fibrous sheath connected to the cuff. The catheter was then removed in its entirety without difficulty with gentle traction. Pressure was held at the base of the neck. Sterile dressing was placed. The patient tolerated the procedure well.     ____________________________ Marin Shutter. Aradhana Gin, PA-C cnh:dmm D: 06/20/2013 08:45:48 ET T: 06/20/2013 10:03:28 ET JOB#: [id number]  cc: Marin Shutter. Lajeana Strough, PA-C, <Dictator> Tanaisha Pittman N Denorris Reust PA ELECTRONICALLY SIGNED 07/06/2013 16:29

## 2014-05-30 NOTE — Discharge Summary (Signed)
PATIENT NAME:  Joseph [name], GOLPHIN MR#:  [id number] DATE OF BIRTH:  [birth year]  DATE OF ADMISSION:  04/23/2013 DATE OF DISCHARGE:  04/25/2013  CONSULTANTS: Dr. Holley Raring from nephrology.  PRIMARY CARE PHYSICIAN: Dr. Brynda Greathouse.  PRIMARY NEPHROLOGIST: Dr. Juleen China. PRIMARY CARDIOLOGIST: Neoma Laming, MD  CHIEF COMPLAINT: Epigastric pain.   DISCHARGE DIAGNOSES:  1.  Acute pancreatitis.  2.  End-stage renal disease on dialysis.  3.  Hypertension.  4.  Cholesterolosis with pending biopsy at [facility].  5.  Diabetes.  6.  Hypertension.  7.  Coronary artery disease status post coronary artery bypass graft.  8.  Hyperlipidemia.   DISCHARGE MEDICATIONS: Crestor 10 mg once a day, cyclobenzaprine 5 mg three times a day as needed, vitamin D2 at 50,000 units once a month, hydralazine 50 mg two times a day, Xanax 0.5 mg 2 times a day as needed, allopurinol 100 mg once a day, ferrous sulfate 325 mg once a day, calcium acetate 667 mg one cap 3 times a day, carvedilol 6.25 mg one tab 2 times a day. He has been told to hold his insulin and losartan.   DIET: Low-sodium, low-fat, low-cholesterol, ADA renal diet.   ACTIVITY: As tolerated.   FOLLOWUP: Please follow with PCP within 1 to 2 weeks. Please follow with your previously scheduled dialysis within the next 1 to 2 days.   DISPOSITION: Home.   SIGNIFICANT LABORATORIES AND IMAGING: Initial BUN of 36, creatinine of 2.55, sodium 133, potassium 3.7, magnesium 1.6. Initial lipase 1120, last lipase of 489. Triglycerides 113, LDL 41. Hemoglobin A1c of 7. LFTs showed AST slightly elevated at 39, otherwise they were within normal limits. Troponins were negative x3. CK total was in the 300 range x3. Initial white count of 8.1, hemoglobin 11.5, platelets were 109 on admission. PTH at 223.   Chest x-ray, PA and lateral, showed no acute findings.   An ultrasound of abdomen, general survey, showed cholelithiasis without evidence of acute cholecystitis. Trace volume of ascites.  Pancreas was poorly visualized secondary to overlying bowel gas.   HISTORY OF PRESENT ILLNESS AND HOSPITAL COURSE: For full details of H and P, please see the dictation on March 18 by Dr. Waldron Labs, but briefly, this is a 69 year old male, who was just recently started on dialysis a week or so ago, with recent cramping and epigastric pain and discomfort who came into the hospital. He was noted to have elevated lipase and admitted to the hospitalist service for acute pancreatitis.   The patient of note has had a history of diabetes, however, has been taken off of his insulin regimen the last week or so because his sugars remained controlled without them, and I think with insulin he had some low blood glucose levels. He was started on some gentle IV fluids given end-stage renal disease status and some pain medications. Initially kept n.p.o. and slowly diet was advanced. He did well and his abdominal pain is significantly improved. He was seen by nephrology and did undergo dialysis with some cramping, however, has been tolerating it.   He did have bouts of hypertension and therefore as Coreg, which in addition to losartan was recently held, was resumed but at a lower dose. At this point, I do not think that the gallstones are the etiology of the acute pancreatitis as LFTs otherwise were not showing any obstructive pattern with normal bilirubin and alk phos. He has had a recent ultrasound of the abdomen as well showing some stones. Gallbladder wall thickness was normal at 1.8  mm. The stone was 1.2 cm. Gallbladder was not distended. At this point, given that he has tolerated advanced diet, he will be discharged with outpatient followup.   TOTAL TIME SPENT: 40 minutes.   CODE STATUS: FULL CODE.   ____________________________ Vivien Presto, MD sa:np D: 04/25/2013 13:56:51 ET T: 04/25/2013 18:42:30 ET JOB#: [id number]  cc: Vivien Presto, MD, <Dictator> Vivien Presto MD ELECTRONICALLY SIGNED 05/08/2013  15:54

## 2014-05-30 NOTE — Op Note (Signed)
PATIENT NAME:  Joseph [name], Joseph [name] MR#:  [id number] DATE OF BIRTH:  Apr 22, 1945  DATE OF PROCEDURE:  04/16/2013  PREOPERATIVE DIAGNOSES:   1. End-stage renal disease.  2. Coronary disease.  3. Diabetes.   POSTOPERATIVE DIAGNOSES: 1. End-stage renal disease.  2. Coronary disease.  3. Diabetes.   PROCEDURES: 1.  Ultrasound guidance for vascular access to right internal jugular vein.  2.  Fluoroscopic guidance for placement of catheter.  3. Placement of a 19 cm tip-to-cuff tunneled hemodialysis catheter via the right internal jugular vein.   SURGEON:  Algernon Huxley, M.D.    ANESTHESIA: Local with monitored conscious sedation.   BLOOD LOSS: Minimal.   INDICATION FOR PROCEDURE: A 69 year old white male who has known chronic kidney disease nearing end-stage renal failure when seen as an outpatient. He was originally planned for a fistula placement. However, his potassium was prohibitively high for fistula creation. His creatinine has continued to rise and he is now felt to be end-stage renal disease. We are placing a PermCath for immediate dialysis use. Risks and benefits were discussed. Informed consent was obtained.   DESCRIPTION OF THE PROCEDURE: The patient was brought to the vascular and interventional radiology suite. The patient's right neck and chest were sterilely prepped and draped and a sterile surgical field was created. The right internal jugular vein was visualized with ultrasound and found to be patent. It was then accessed under direct ultrasound guidance and a permanent image was recorded. A wire was placed. After a skin nick and dilatation, the peel-away sheath was placed over the wire.   I then turned my attention to an area under the clavicle. Approximately 2 fingerbreadths below the clavicle a small counter incision was created and we tunneled from the subclavicular incision to the access site. Using fluoroscopic guidance, a 19 cm tip-to-cuff tunneled hemodialysis catheter was  selected, tunneled from the subclavicular incision to the access site. It was then placed through the peel-away sheath and the peel-away sheath was removed. The catheter tips were parked in the right atrium. The appropriate distal connectors were placed. It withdrew blood well and flushed easily with heparinized saline and a concentrated heparin solution was then placed. It was secured to the chest wall with 2 Prolene sutures. The access incision was closed with a single 4-0 Monocryl. A 4-0 Monocryl pursestring suture was placed around the exit site. Sterile dressings were placed.   The patient tolerated the procedure well and was taken to the recovery room in stable condition.          ____________________________ Algernon Huxley, MD jsd:sg D: 04/16/2013 13:42:13 ET T: 04/16/2013 13:51:33 ET JOB#: [id number]  cc: Algernon Huxley, MD, <Dictator> Algernon Huxley MD ELECTRONICALLY SIGNED 04/24/2013 15:15

## 2014-06-07 NOTE — Discharge Summary (Signed)
PATIENT NAME:  Joseph [name], Joseph [name] MR#:  [id number] DATE OF BIRTH:  02-Feb-1946  DATE OF ADMISSION:  04/22/2014 DATE OF DISCHARGE:  04/22/2014  ADMITTING DIAGNOSIS: Need for hemodialysis status post cardiac CT scan.   DISCHARGE DIAGNOSES:  1. Hemodialysis post cardiac catheterization CT scan.  2. Hyperkalemia status post dialysis with normalization of potassium.  3. Chronic renal failure, history of requiring dialysis with acute renal failure.  4. History of pancreatitis.  5. Hypertension.  6. Liver cirrhosis.  7. History of monoclonal gammopathy of undetermined significance.   8. Diabetes type 2.  9. Hypertension.  10. Coronary artery disease status post coronary artery bypass graft.  11. Hyperlipidemia.  12. History of prostate cancer.   CONSULTANTS: Dr. Candiss Norse.   PROCEDURE: Hemodialysis.   PERTINENT LABORATORIES AND EVALUATIONS: Admitting glucose 103, BUN 49, creatinine 3.48, sodium 138, potassium 7.5, chloride 114, CO2 19, calcium 8.2. WBC 5.3, hemoglobin 10.4, platelets count 72,000. Potassium March 16, 4.6.   HOSPITAL COURSE: The patient is a 69 year old white male who has a history of acute renal failure requiring dialysis last year from March 2015 to October 2015, who has had subsequent recovery of his renal function, who has been having chest pain and was seen by his cardiologist due to his thrombocytopenia, they decided against a cardiac catheterization, but wanted to do a cardiac CT scan.  They discussed the case with his primary nephrologist and they recommended that he get dialyzed today. The patient was electively admitted for observation for dialysis. Once he had his blood work done he was noted to have hyperkalemia. The patient received dialysis and he was seen by nephrology, and his potassium was rechecked and it was normal. He was stable for discharge. At this time he is stable for discharge.   DISCHARGE MEDICATIONS:  1. Crestor 10 one tab p.o. daily.  2. Vitamin D2, 50,000  international units once a month.  3. Hydralazine 50 mg 1 tab p.o. b.i.d.  4. Carvedilol 6.25 one tab p.o. b.i.d.  5. Lyrica 150 one tab p.o. b.i.d.  6. NovoLog 70/30, 20 units daily.  7. Ranexa 1000 mg 1 tab p.o. b.i.d.  8. Aspirin 81 one tab p.o. daily.   DIET: Carbohydrate-controlled diet.   ACTIVITY: As tolerated.   FOLLOWUP: Follow with primary MD in 1-2 weeks.    TIME SPENT: 35 minutes.      ____________________________ Lafonda Mosses Posey Pronto, MD shp:bu D: 04/22/2014 17:07:23 ET T: 04/22/2014 18:03:24 ET JOB#: [id number]  cc: [place] Kenley H. Posey Pronto, MD, <Dictator> Alric Seton MD ELECTRONICALLY SIGNED 05/03/2014 12:54

## 2014-06-07 NOTE — H&P (Signed)
PATIENT NAME:  Joseph [name], Joseph [name] MR#:  [id number] DATE OF BIRTH:  August 17, 1945  DATE OF ADMISSION:  04/22/2014  PRIMARY CARE PROVIDER: None.  REFERRING PHYSICIAN:  Mamie Levers, MD  REASON FOR ADMISSION: Patient requiring hemodialysis, post cardiac CT scan.   HISTORY OF PRESENT ILLNESS: The patient is a 69 year old white male who has a history of acute renal failure, requiring dialysis last year from March 2015 to October 2015. The patient subsequently recovered his renal function and did not require further dialysis.  He has been having on and off chest pain. He also has concurrent history of liver cirrhosis and has thrombocytopenia. Therefore, the cardiologist felt that cardiac catheterization would not be appropriate. Therefore, he underwent a CT per coronaries. The patient had the procedure, and per his nephrologist, due to his history of renal failure requiring dialysis, he felt that he should get dialyzed. He already has a fistula in place. Therefore, we are asked to admit him for observation for dialysis. Besides the chest pain and dyspnea on exertion, he has no other complaints. He denies any nausea, vomiting or diarrhea. Denies any urinary symptoms.   PAST MEDICAL HISTORY:  1.  History of pancreatitis.  2.  History of acute renal failure on chronic renal failure, requiring dialysis.  3.  Hypertension.  4.  Liver cirrhosis.  5.  History of MUGS. 6.  Diabetes type 2.  7.  Hypertension.  8.  Coronary artery disease, status post CABG.  9.  Hyperlipidemia. 10.  History of prostate cancer.   ALLERGIES: SULFA and WELCHOL and MORPHINE.   PAST SURGICAL HISTORY: CABG and penile implants.   CURRENT MEDICATIONS AT HOME: He is on vitamin D 250,000 international units once a month,  Ranexa 1000 mg 1 tablet p.o. b.i.d., NovoLog 70/30 20 units once a day, Lyrica 150 mg b.i.d., hydralazine 50 mg 1 tab p.o. b.i.d., Crestor 10 mg daily, Coreg 6.25 mg 1 tab p.o. b.i.d., aspirin 81 mg 1 tab p.o.  daily.   SOCIAL HISTORY: Does not smoke. Does not drink. No drugs.   FAMILY HISTORY: Positive for hypertension.   REVIEW OF SYSTEMS: CONSTITUTIONAL: Denies any fevers, fatigue, weakness. No pain. No weight loss or weight gain.  EYES: No blurred or double vision. No pain. No redness.  EARS, NOSE, AND THROAT: No tinnitus. No ear pain. No hearing loss. No seasonal or year-round allergies. No epistaxis. RESPIRATORY: Denies any cough, wheezing, hemoptysis. Complains of dyspnea on exertion . CARDIOVASCULAR: Denies any chest pain, orthopnea, edema.  GASTROINTESTINAL: No nausea, vomiting, diarrhea. No abdominal pain. No hematemesis.  GENITOURINARY: Denies any dysuria, hematuria, renal colic or frequency.  ENDOCRINE: Denies any polyuria, nocturia.  HEMATOLOGIC AND LYMPHATIC: Has history of thrombocytopenia.  SKIN: No acne. No rash.  MUSCULOSKELETAL: No pain in the neck, back or shoulder.  NEUROLOGIC: No CVA, TIA or seizure.  PSYCHIATRIC: No anxiety, insomnia.   PHYSICAL EXAMINATION: VITAL SIGNS: Temperature 97.1, pulse 46, respirations 18, blood pressure 146/69, O2 96%.  GENERAL: The patient is a well-developed, well-nourished male in no acute distress.  HEENT: Head atraumatic, normocephalic. Pupils equally round, reactive to light and accommodation. There is no conjunctival pallor. No sclerae icterus. Nasal exam shows no drainage or ulceration.  OROPHARYNX: Clear; no exudate.  NECK: Supple without any thyromegaly.  CARDIOVASCULAR: Regular rate and rhythm. No murmurs, rubs, clicks, or gallops.  LUNGS: Clear to auscultation bilaterally without any rales, rhonchi, wheezing.  ABDOMEN: Soft, nontender, nondistended. Positive bowel sounds x4. EXTREMITIES: No clubbing, cyanosis, or edema.  NEUROLOGIC: Awake, alert, oriented x3. No focal deficits.  LYMPH NODES: Nonpalpable.  PSYCHIATRIC: Not anxious or depressed.   EVALUATIONS: Glucose 103, BUN 49, creatinine 3.48, sodium 138, potassium 7.5,  chloride 114, CO2 19, calcium is 8.2. WBC 5.3, hemoglobin 10.4, platelet count was 72,000.   ASSESSMENT AND PLAN: The patient is a 69 year old white male with history of renal failure in the past, requiring hemodialysis, status post CT coronaries. Received dye; therefore,   we are asked to admit him for observation for hemodialysis.  1.  Chronic renal failure with hemodialysis suggested by his primary nephrologist due to the patient receiving IV dye for a procedure. He will be hemolyzed today.  2.  Diabetes. We will place him on sliding scale insulin. Continue his home regimen.  3.  Coronary artery disease. Further recommendations per his primary cardiologist. Will continue aspirin and Coreg.  4.  Hypertension. Continue hydralazine and Coreg. Blood pressure is stable.   Time spent on this patient is 50 minutes.   ____________________________ Lafonda Mosses Posey Pronto, MD shp:je D: 04/22/2014 12:55:11 ET T: 04/22/2014 13:29:25 ET JOB#: [id number]  cc: Raechel Marcos H. Posey Pronto, MD, <Dictator> Alric Seton MD ELECTRONICALLY SIGNED 04/22/2014 14:22

## 2014-07-29 ENCOUNTER — Ambulatory Visit
Admission: RE | Admit: 2014-07-29 | Discharge: 2014-07-29 | Disposition: A | Discharge status: Home / Self Care | Payer: Medicare Other | Source: Ambulatory Visit | Attending: Cardiovascular Disease | Admitting: Cardiovascular Disease

## 2014-07-29 DIAGNOSIS — R079 Chest pain, unspecified: Secondary | ICD-10-CM | POA: Diagnosis not present

## 2014-07-29 DIAGNOSIS — Z539 Procedure and treatment not carried out, unspecified reason: Secondary | ICD-10-CM | POA: Diagnosis not present

## 2014-07-29 MED ORDER — SODIUM CHLORIDE 0.9 % IV SOLN: INTRAVENOUS | Status: DC

## 2014-07-29 MED ORDER — SODIUM CHLORIDE 0.9 % IV SOLN: 250.0000 mL | INTRAVENOUS | Status: DC | PRN

## 2014-07-29 NOTE — OR Nursing (Signed)
Procedure rescheduled for 07/30/2014 per Dr Humphrey Rolls.

## 2014-07-30 ENCOUNTER — Encounter
Admission: RE | Disposition: A | Discharge status: Home / Self Care | Payer: Self-pay | Source: Ambulatory Visit | Attending: Cardiovascular Disease

## 2014-07-30 ENCOUNTER — Encounter
Admission: RE | Disposition: A | Discharge status: Home / Self Care | Payer: Self-pay | Source: Ambulatory Visit | Attending: Internal Medicine

## 2014-07-30 ENCOUNTER — Ambulatory Visit
Admission: RE | Admit: 2014-07-30 | Discharge: 2014-07-31 | Disposition: A | Discharge status: Home / Self Care | Payer: Medicare Other | Source: Ambulatory Visit | Attending: Internal Medicine | Admitting: Internal Medicine

## 2014-07-30 DIAGNOSIS — K219 Gastro-esophageal reflux disease without esophagitis: Secondary | ICD-10-CM | POA: Insufficient documentation

## 2014-07-30 DIAGNOSIS — E785 Hyperlipidemia, unspecified: Secondary | ICD-10-CM | POA: Diagnosis not present

## 2014-07-30 DIAGNOSIS — D6959 Other secondary thrombocytopenia: Secondary | ICD-10-CM | POA: Insufficient documentation

## 2014-07-30 DIAGNOSIS — Z882 Allergy status to sulfonamides status: Secondary | ICD-10-CM | POA: Insufficient documentation

## 2014-07-30 DIAGNOSIS — R079 Chest pain, unspecified: Secondary | ICD-10-CM

## 2014-07-30 DIAGNOSIS — Z992 Dependence on renal dialysis: Secondary | ICD-10-CM | POA: Diagnosis not present

## 2014-07-30 DIAGNOSIS — I10 Essential (primary) hypertension: Secondary | ICD-10-CM

## 2014-07-30 DIAGNOSIS — I252 Old myocardial infarction: Secondary | ICD-10-CM | POA: Diagnosis not present

## 2014-07-30 DIAGNOSIS — F419 Anxiety disorder, unspecified: Secondary | ICD-10-CM | POA: Insufficient documentation

## 2014-07-30 DIAGNOSIS — K769 Liver disease, unspecified: Secondary | ICD-10-CM | POA: Insufficient documentation

## 2014-07-30 DIAGNOSIS — E780 Pure hypercholesterolemia: Secondary | ICD-10-CM | POA: Diagnosis not present

## 2014-07-30 DIAGNOSIS — Z8546 Personal history of malignant neoplasm of prostate: Secondary | ICD-10-CM | POA: Insufficient documentation

## 2014-07-30 DIAGNOSIS — R0602 Shortness of breath: Secondary | ICD-10-CM | POA: Insufficient documentation

## 2014-07-30 DIAGNOSIS — I509 Heart failure, unspecified: Secondary | ICD-10-CM | POA: Diagnosis not present

## 2014-07-30 DIAGNOSIS — Z8249 Family history of ischemic heart disease and other diseases of the circulatory system: Secondary | ICD-10-CM | POA: Diagnosis not present

## 2014-07-30 DIAGNOSIS — Z951 Presence of aortocoronary bypass graft: Secondary | ICD-10-CM | POA: Diagnosis present

## 2014-07-30 DIAGNOSIS — I129 Hypertensive chronic kidney disease with stage 1 through stage 4 chronic kidney disease, or unspecified chronic kidney disease: Principal | ICD-10-CM | POA: Insufficient documentation

## 2014-07-30 DIAGNOSIS — N189 Chronic kidney disease, unspecified: Secondary | ICD-10-CM | POA: Insufficient documentation

## 2014-07-30 DIAGNOSIS — D472 Monoclonal gammopathy: Secondary | ICD-10-CM

## 2014-07-30 DIAGNOSIS — F329 Major depressive disorder, single episode, unspecified: Secondary | ICD-10-CM

## 2014-07-30 DIAGNOSIS — E119 Type 2 diabetes mellitus without complications: Secondary | ICD-10-CM | POA: Diagnosis not present

## 2014-07-30 DIAGNOSIS — Z7982 Long term (current) use of aspirin: Secondary | ICD-10-CM | POA: Insufficient documentation

## 2014-07-30 DIAGNOSIS — D631 Anemia in chronic kidney disease: Secondary | ICD-10-CM | POA: Diagnosis not present

## 2014-07-30 DIAGNOSIS — N2889 Other specified disorders of kidney and ureter: Secondary | ICD-10-CM

## 2014-07-30 DIAGNOSIS — Z794 Long term (current) use of insulin: Secondary | ICD-10-CM | POA: Insufficient documentation

## 2014-07-30 DIAGNOSIS — D696 Thrombocytopenia, unspecified: Secondary | ICD-10-CM | POA: Diagnosis not present

## 2014-07-30 DIAGNOSIS — K760 Fatty (change of) liver, not elsewhere classified: Secondary | ICD-10-CM | POA: Insufficient documentation

## 2014-07-30 DIAGNOSIS — I429 Cardiomyopathy, unspecified: Secondary | ICD-10-CM | POA: Insufficient documentation

## 2014-07-30 DIAGNOSIS — I25119 Atherosclerotic heart disease of native coronary artery with unspecified angina pectoris: Secondary | ICD-10-CM | POA: Diagnosis not present

## 2014-07-30 DIAGNOSIS — R161 Splenomegaly, not elsewhere classified: Secondary | ICD-10-CM | POA: Diagnosis not present

## 2014-07-30 DIAGNOSIS — I209 Angina pectoris, unspecified: Secondary | ICD-10-CM

## 2014-07-30 DIAGNOSIS — Z87891 Personal history of nicotine dependence: Secondary | ICD-10-CM | POA: Diagnosis not present

## 2014-07-30 DIAGNOSIS — Z79899 Other long term (current) drug therapy: Secondary | ICD-10-CM | POA: Insufficient documentation

## 2014-07-30 DIAGNOSIS — R162 Hepatomegaly with splenomegaly, not elsewhere classified: Secondary | ICD-10-CM

## 2014-07-30 HISTORY — PX: CARDIAC CATHETERIZATION: SHX172

## 2014-07-30 LAB — GLUCOSE, CAPILLARY
GLUCOSE-CAPILLARY: 161 mg/dL — AB (ref 65–99)
Glucose-Capillary: 139 mg/dL — ABNORMAL HIGH (ref 65–99)
Glucose-Capillary: 195 mg/dL — ABNORMAL HIGH (ref 65–99)

## 2014-07-30 LAB — BASIC METABOLIC PANEL
ANION GAP: 5 (ref 5–15)
BUN: 54 mg/dL — ABNORMAL HIGH (ref 6–20)
CALCIUM: 7.8 mg/dL — AB (ref 8.9–10.3)
CO2: 24 mmol/L (ref 22–32)
CREATININE: 3.72 mg/dL — AB (ref 0.61–1.24)
Chloride: 112 mmol/L — ABNORMAL HIGH (ref 101–111)
GFR calc Af Amer: 18 mL/min — ABNORMAL LOW (ref >60)
GFR, EST NON AFRICAN AMERICAN: 16 mL/min — AB (ref >60)
GLUCOSE: 149 mg/dL — AB (ref 65–99)
Potassium: 4.6 mmol/L (ref 3.5–5.1)
SODIUM: 141 mmol/L (ref 135–145)

## 2014-07-30 LAB — CBC
HCT: 30.7 % — ABNORMAL LOW (ref 40.0–52.0)
Hemoglobin: 9.9 g/dL — ABNORMAL LOW (ref 13.0–18.0)
MCH: 28.6 pg (ref 26.0–34.0)
MCHC: 32.3 g/dL (ref 32.0–36.0)
MCV: 88.5 fL (ref 80.0–100.0)
PLATELETS: 77 10*3/uL — AB (ref 150–440)
RBC: 3.47 MIL/uL — ABNORMAL LOW (ref 4.40–5.90)
RDW: 16.2 % — AB (ref 11.5–14.5)
WBC: 4.7 10*3/uL (ref 3.8–10.6)

## 2014-07-30 SURGERY — LEFT HEART CATH AND CORS/GRAFTS ANGIOGRAPHY

## 2014-07-30 SURGERY — LEFT HEART CATH
Anesthesia: Moderate Sedation

## 2014-07-30 MED ORDER — ALTEPLASE 2 MG IJ SOLR
2.0000 mg | Freq: Once | INTRAMUSCULAR | Status: AC | PRN
Start: 2014-07-30 — End: 2014-07-30

## 2014-07-30 MED ORDER — HEPARIN SODIUM (PORCINE) 1000 UNIT/ML DIALYSIS: 1000.0000 [IU] | INTRAMUSCULAR | Status: DC | PRN

## 2014-07-30 MED ORDER — HEPARIN (PORCINE) IN NACL 2-0.9 UNIT/ML-% IJ SOLN
INTRAMUSCULAR | Status: AC
  Filled 2014-07-30: qty 1000

## 2014-07-30 MED ORDER — ACETAMINOPHEN 650 MG RE SUPP: 650.0000 mg | Freq: Four times a day (QID) | RECTAL | Status: DC | PRN

## 2014-07-30 MED ORDER — VITAMIN D (ERGOCALCIFEROL) 1.25 MG (50000 UNIT) PO CAPS: 50000.0000 [IU] | ORAL_CAPSULE | ORAL | Status: DC

## 2014-07-30 MED ORDER — MIDAZOLAM HCL 2 MG/2ML IJ SOLN
INTRAMUSCULAR | Status: AC
  Filled 2014-07-30: qty 2

## 2014-07-30 MED ORDER — HYDRALAZINE HCL 20 MG/ML IJ SOLN
INTRAMUSCULAR | Status: DC | PRN
Start: 2014-07-30 — End: 2014-07-30
  Administered 2014-07-30: 10 mg via INTRAVENOUS

## 2014-07-30 MED ORDER — ONDANSETRON HCL 4 MG PO TABS: 4.0000 mg | ORAL_TABLET | Freq: Four times a day (QID) | ORAL | Status: DC | PRN

## 2014-07-30 MED ORDER — LIDOCAINE HCL (PF) 1 % IJ SOLN
5.0000 mL | INTRAMUSCULAR | Status: DC | PRN
  Filled 2014-07-30: qty 5

## 2014-07-30 MED ORDER — ACETAMINOPHEN 325 MG PO TABS: 650.0000 mg | ORAL_TABLET | Freq: Four times a day (QID) | ORAL | Status: DC | PRN

## 2014-07-30 MED ORDER — PENTAFLUOROPROP-TETRAFLUOROETH EX AERO
1.0000 "application " | INHALATION_SPRAY | CUTANEOUS | Status: DC | PRN
  Filled 2014-07-30: qty 30

## 2014-07-30 MED ORDER — RANOLAZINE ER 500 MG PO TB12
500.0000 mg | ORAL_TABLET | Freq: Two times a day (BID) | ORAL | Status: DC
  Administered 2014-07-30 – 2014-07-31 (×2): 500 mg via ORAL
  Filled 2014-07-30 (×3): qty 1

## 2014-07-30 MED ORDER — PREGABALIN 75 MG PO CAPS
150.0000 mg | ORAL_CAPSULE | Freq: Two times a day (BID) | ORAL | Status: DC
  Administered 2014-07-30 – 2014-07-31 (×2): 150 mg via ORAL
  Filled 2014-07-30 (×2): qty 2

## 2014-07-30 MED ORDER — FENTANYL CITRATE (PF) 100 MCG/2ML IJ SOLN
INTRAMUSCULAR | Status: DC | PRN
  Administered 2014-07-30: 50 ug via INTRAVENOUS

## 2014-07-30 MED ORDER — ONDANSETRON HCL 4 MG/2ML IJ SOLN: 4.0000 mg | Freq: Four times a day (QID) | INTRAMUSCULAR | Status: DC | PRN

## 2014-07-30 MED ORDER — ALPRAZOLAM 0.5 MG PO TABS
0.5000 mg | ORAL_TABLET | Freq: Every day | ORAL | Status: DC
  Administered 2014-07-30: 0.5 mg via ORAL
  Filled 2014-07-30: qty 1

## 2014-07-30 MED ORDER — SODIUM CHLORIDE 0.9 % IJ SOLN
3.0000 mL | Freq: Two times a day (BID) | INTRAMUSCULAR | Status: DC
  Administered 2014-07-30: 09:00:00 via INTRAVENOUS

## 2014-07-30 MED ORDER — SODIUM CHLORIDE 0.9 % IV SOLN
INTRAVENOUS | Status: DC
  Administered 2014-07-30 – 2014-07-31 (×2): via INTRAVENOUS

## 2014-07-30 MED ORDER — ISOSORBIDE MONONITRATE ER 30 MG PO TB24
30.0000 mg | ORAL_TABLET | Freq: Every day | ORAL | Status: DC
  Administered 2014-07-31: 30 mg via ORAL
  Filled 2014-07-30 (×2): qty 1

## 2014-07-30 MED ORDER — INSULIN GLARGINE 100 UNIT/ML ~~LOC~~ SOLN
20.0000 [IU] | Freq: Every day | SUBCUTANEOUS | Status: DC
  Administered 2014-07-30: 20 [IU] via SUBCUTANEOUS
  Filled 2014-07-30 (×2): qty 0.2

## 2014-07-30 MED ORDER — LIDOCAINE-PRILOCAINE 2.5-2.5 % EX CREA: 1.0000 "application " | TOPICAL_CREAM | CUTANEOUS | Status: DC | PRN

## 2014-07-30 MED ORDER — SODIUM CHLORIDE 0.9 % IJ SOLN
3.0000 mL | Freq: Two times a day (BID) | INTRAMUSCULAR | Status: DC
  Administered 2014-07-30: 3 mL via INTRAVENOUS

## 2014-07-30 MED ORDER — IOHEXOL 300 MG/ML  SOLN
INTRAMUSCULAR | Status: DC | PRN
  Administered 2014-07-30: 80 mL via INTRA_ARTERIAL

## 2014-07-30 MED ORDER — SODIUM CHLORIDE 0.9 % IV SOLN: 100.0000 mL | INTRAVENOUS | Status: DC | PRN

## 2014-07-30 MED ORDER — TORSEMIDE 20 MG PO TABS
20.0000 mg | ORAL_TABLET | Freq: Every day | ORAL | Status: DC
  Filled 2014-07-30: qty 1

## 2014-07-30 MED ORDER — HYDRALAZINE HCL 20 MG/ML IJ SOLN
10.0000 mg | Freq: Once | INTRAMUSCULAR | Status: AC
  Administered 2014-07-30 (×2): 10 mg via INTRAVENOUS

## 2014-07-30 MED ORDER — HEPARIN SODIUM (PORCINE) 5000 UNIT/ML IJ SOLN
5000.0000 [IU] | Freq: Three times a day (TID) | INTRAMUSCULAR | Status: DC
  Administered 2014-07-30: 5000 [IU] via SUBCUTANEOUS
  Filled 2014-07-30 (×2): qty 1

## 2014-07-30 MED ORDER — MIDAZOLAM HCL 2 MG/2ML IJ SOLN
INTRAMUSCULAR | Status: DC | PRN
  Administered 2014-07-30: 1 mg via INTRAVENOUS

## 2014-07-30 MED ORDER — HYDRALAZINE HCL 50 MG PO TABS
50.0000 mg | ORAL_TABLET | Freq: Two times a day (BID) | ORAL | Status: DC
  Administered 2014-07-30 – 2014-07-31 (×2): 50 mg via ORAL
  Filled 2014-07-30 (×2): qty 1

## 2014-07-30 MED ORDER — CARVEDILOL 12.5 MG PO TABS
12.5000 mg | ORAL_TABLET | Freq: Two times a day (BID) | ORAL | Status: DC
  Administered 2014-07-30: 12.5 mg via ORAL
  Filled 2014-07-30 (×3): qty 1

## 2014-07-30 MED ORDER — ASPIRIN 81 MG PO CHEW: 81.0000 mg | CHEWABLE_TABLET | Freq: Every day | ORAL | Status: DC

## 2014-07-30 MED ORDER — ROSUVASTATIN CALCIUM 10 MG PO TABS
10.0000 mg | ORAL_TABLET | Freq: Every day | ORAL | Status: DC
  Administered 2014-07-31: 10 mg via ORAL
  Filled 2014-07-30: qty 1

## 2014-07-30 MED ORDER — PANTOPRAZOLE SODIUM 40 MG PO TBEC
40.0000 mg | DELAYED_RELEASE_TABLET | Freq: Every day | ORAL | Status: DC
  Administered 2014-07-31: 40 mg via ORAL
  Filled 2014-07-30 (×2): qty 1

## 2014-07-30 MED ORDER — HYDRALAZINE HCL 20 MG/ML IJ SOLN
INTRAMUSCULAR | Status: AC
  Filled 2014-07-30: qty 1

## 2014-07-30 MED ORDER — NEPRO/CARBSTEADY PO LIQD: 237.0000 mL | ORAL | Status: DC | PRN

## 2014-07-30 MED ORDER — SODIUM BICARBONATE 650 MG PO TABS
650.0000 mg | ORAL_TABLET | Freq: Four times a day (QID) | ORAL | Status: DC
  Administered 2014-07-30 – 2014-07-31 (×2): 650 mg via ORAL
  Filled 2014-07-30 (×7): qty 1

## 2014-07-30 MED ORDER — SODIUM CHLORIDE 0.9 % IV SOLN
INTRAVENOUS | Status: DC
Start: 2014-07-30 — End: 2014-07-30
  Administered 2014-07-30: 09:00:00 via INTRAVENOUS

## 2014-07-30 MED ORDER — FENTANYL CITRATE (PF) 100 MCG/2ML IJ SOLN
INTRAMUSCULAR | Status: AC
  Filled 2014-07-30: qty 2

## 2014-07-30 SURGICAL SUPPLY — 11 items
CATH INFINITI 5 FR IM (CATHETERS) ×3 IMPLANT
CATH INFINITI 5FR ANG PIGTAIL (CATHETERS) IMPLANT
CATH INFINITI 5FR JL4 (CATHETERS) ×3 IMPLANT
CATH INFINITI JR4 5F (CATHETERS) ×3 IMPLANT
DEVICE CLOSURE MYNXGRIP 5F (Vascular Products) ×3 IMPLANT
KIT MANI 3VAL PERCEP (MISCELLANEOUS) ×3 IMPLANT
NEEDLE PERC 18GX7CM (NEEDLE) ×3 IMPLANT
PACK CARDIAC CATH (CUSTOM PROCEDURE TRAY) ×3 IMPLANT
SHEATH PINNACLE 5F 10CM (SHEATH) ×3 IMPLANT
WIRE EMERALD 3MM-J .035X150CM (WIRE) ×3 IMPLANT
WIRE EMERALD 3MM-J .035X260CM (WIRE) ×3 IMPLANT

## 2014-07-30 NOTE — Progress Notes (Signed)
  Subjective:   Patient admitted for cardiac cath. No angioplasty or stent was required. Asked to do dialysis today  Objective:  Vital signs in last 24 hours:  Temp:  [97.5 F (36.4 C)-97.9 F (36.6 C)] 97.5 F (36.4 C) (06/23 1415) Pulse Rate:  [54-66] 60 (06/23 1530) Resp:  [15-24] 18 (06/23 1530) BP: (95-156)/(64-101) 152/69 mmHg (06/23 1530) SpO2:  [93 %-100 %] 100 % (06/23 1500) Weight:  [98.113 kg (216 lb 4.8 oz)-100.699 kg (222 lb)] 100 kg (220 lb 7.4 oz) (06/23 1415)  Weight change:  Filed Weights   07/30/14 0920 07/30/14 1154 07/30/14 1415  Weight: 100.699 kg (222 lb) 98.113 kg (216 lb 4.8 oz) 100 kg (220 lb 7.4 oz)    Intake/Output:       Physical Exam: General: NAD, laying in bed  HEENT Anicteric, moist mucus membranes  Neck supple  Pulm/lungs Clear b/l  CVS/Heart regular  Abdomen:  Soft, NT  Extremities: No edema  Neurologic: Alert, oriented  Skin: No rashes  Access: Left arm AVF       Basic Metabolic Panel:  Recent Labs Lab 07/30/14 1241  NA 141  K 4.6  CL 112*  CO2 24  GLUCOSE 149*  BUN 54*  CREATININE 3.72*  CALCIUM 7.8*     CBC:  Recent Labs Lab 07/30/14 1241  WBC 4.7  HGB 9.9*  HCT 30.7*  MCV 88.5  PLT 77*      Microbiology: No results found for this or any previous visit.  Coagulation Studies: No results for input(s): LABPROT, INR in the last 72 hours.  Urinalysis: No results for input(s): COLORURINE, LABSPEC, PHURINE, GLUCOSEU, HGBUR, BILIRUBINUR, KETONESUR, PROTEINUR, UROBILINOGEN, NITRITE, LEUKOCYTESUR in the last 72 hours.  Invalid input(s): APPERANCEUR    Imaging: No results found.   Medications:   . sodium chloride 75 mL/hr at 07/30/14 1331   . ALPRAZolam  0.5 mg Oral QHS  . [START ON 07/31/2014] aspirin  81 mg Oral Daily  . carvedilol  12.5 mg Oral BID WC  . heparin  5,000 Units Subcutaneous 3 times per day  . hydrALAZINE  50 mg Oral BID  . insulin glargine  20 Units Subcutaneous QHS  .  isosorbide mononitrate  30 mg Oral Daily  . pantoprazole  40 mg Oral Daily  . pregabalin  150 mg Oral BID  . ranolazine  500 mg Oral BID  . [START ON 07/31/2014] rosuvastatin  10 mg Oral Daily  . sodium bicarbonate  650 mg Oral QID  . sodium chloride  3 mL Intravenous Q12H  . torsemide  20 mg Oral Daily  . [START ON 08/11/2014] Vitamin D (Ergocalciferol)  50,000 Units Oral Q30 days   acetaminophen **OR** acetaminophen, ondansetron **OR** ondansetron (ZOFRAN) IV  Assessment/ Plan:  69 y.o. male coronary artery disease status post CABG, diabetes mellitus type 2, hypertension, prostate cancer, hyperlipidemia   1. CKD st 4 - s/p cardicac cath - HD today to clear dye load 2. Anemia of CKD Hgb 9.9 No indication for procrit 3. DM-2/CKD - continue good control of DM and HTN     LOS: 0 Joseph [name] 6/23/20163:47 PM

## 2014-07-30 NOTE — H&P (Signed)
[facility] [place] NAME: Joseph [name]    MR#:  [id number]  DATE OF BIRTH:  January 05, 1946  DATE OF ADMISSION:  07/30/2014  PRIMARY CARE PHYSICIAN: Marden Noble, MD   REQUESTING/REFERRING PHYSICIAN: Dr. Dante Gang  CHIEF COMPLAINT:   Needing dialysis after catheter  HISTORY OF PRESENT ILLNESS: Joseph [name]  is a 69 y.o. male with a known history of hypertension, hyperlipidemia chronic renal failure, coronary artery disease, cardiomyopathy with systolic dysfunction who in the past was on dialysis from March 2015 to October 2015 then his renal function improved and he was taken off dialysis. Patient has been having chest pain and shortness of breath therefore Dr. Humphrey Rolls did a cardiac catheterization earlier today and he wanted the patient to be dialyzed due to his history of requiring hemodialysis. Patient reports that his been having on and off chest pain for the past few days and he has chronic shortness of breath. Otherwise denies any nausea vomiting diarrhea denies any urinary symptoms.   PAST MEDICAL HISTORY:   Past Medical History  Diagnosis Date  . Hypertension   . Hypercholesterolemia   . Prostate cancer   . Renal disorder     Dr. Juleen China  . Enlarged liver   . Spleen enlarged   . Thrombocytopenia   . Myocardial infarction   . Anginal pain   . Anxiety   . Depression   . Shortness of breath   . Diabetes mellitus without complication     INSULIN DEPENDENT  . GERD (gastroesophageal reflux disease)   . History of shingles   . CHF (congestive heart failure)   . Dialysis patient     currently on hold- Nov 2015    PAST SURGICAL HISTORY:  Past Surgical History  Procedure Laterality Date  . Cardiac surgery    . Shoulder arthroscopy    . Penile prosthesis implant    . Myocardial infarction    . Cardiac catheterization    . Coronary angioplasty    . Coronary artery bypass graft  2001  . Colonoscopy    . Liver biopsy     . Av fistula placement Left 2015    SOCIAL HISTORY:  History  Substance Use Topics  . Smoking status: Former Smoker    Quit date: 02/07/2008  . Smokeless tobacco: Former Systems developer  . Alcohol Use: No    FAMILY HISTORY:  Family History  Problem Relation Age of Onset  . Acute myelogenous leukemia Brother   . CAD Brother   . Diabetes Mellitus II Brother     DRUG ALLERGIES:  Allergies  Allergen Reactions  . Sulfa Antibiotics Itching    hives    REVIEW OF SYSTEMS:   CONSTITUTIONAL: No fever, fatigue or weakness.  EYES: No blurred or double vision.  EARS, NOSE, AND THROAT: No tinnitus or ear pain.  RESPIRATORY: No cough, positive shortness of breath, wheezing or hemoptysis.  CARDIOVASCULAR: Positive chest pain, orthopnea, positive edema.  GASTROINTESTINAL: No nausea, vomiting, diarrhea or abdominal pain.  GENITOURINARY: No dysuria, hematuria.  ENDOCRINE: No polyuria, nocturia,  HEMATOLOGY: No anemia, easy bruising or bleeding SKIN: No rash or lesion. MUSCULOSKELETAL: No joint pain or arthritis.   NEUROLOGIC: No tingling, numbness, weakness.  PSYCHIATRY: No anxiety or depression.   MEDICATIONS AT HOME:  Prior to Admission medications   Medication Sig Start Date End Date Taking? Authorizing Provider  ALPRAZolam Duanne Moron) 0.5 MG tablet Take 0.5 mg by mouth at bedtime.  03/30/13  Yes Historical Provider, MD  aspirin 81 MG tablet Take 81 mg by mouth daily.   Yes Historical Provider, MD  carvedilol (COREG) 12.5 MG tablet Take 1 tablet (12.5 mg total) by mouth 2 (two) times daily with a meal. 04/03/13  Yes Thurnell Lose, MD  CRESTOR 10 MG tablet Take 10 mg by mouth daily.  02/11/13  Yes Historical Provider, MD  hydrALAZINE (APRESOLINE) 50 MG tablet Take 50 mg by mouth 2 (two) times daily.  03/20/13  Yes Historical Provider, MD  isosorbide mononitrate (IMDUR) 30 MG 24 hr tablet Take 30 mg by mouth daily.   Yes Historical Provider, MD  LYRICA 75 MG capsule  12/02/13  Yes Historical  Provider, MD  pantoprazole (PROTONIX) 40 MG tablet Take 40 mg by mouth daily.   Yes Historical Provider, MD  ranolazine (RANEXA) 500 MG 12 hr tablet Take 500 mg by mouth 2 (two) times daily.   Yes Historical Provider, MD  sodium bicarbonate 650 MG tablet Take 650 mg by mouth 4 (four) times daily.   Yes Historical Provider, MD  torsemide (DEMADEX) 20 MG tablet Take 20 mg by mouth daily.  03/11/13  Yes Historical Provider, MD  Vitamin D, Ergocalciferol, (DRISDOL) 50000 UNITS CAPS capsule Take 50,000 Units by mouth every 30 (thirty) days.   Yes Historical Provider, MD  LANTUS SOLOSTAR 100 UNIT/ML Solostar Pen Inject 40 Units into the skin at bedtime.  02/06/13   Historical Provider, MD      PHYSICAL EXAMINATION:   VITAL SIGNS: Blood pressure 113/64, pulse 54, temperature 97.9 F (36.6 C), temperature source Oral, resp. rate 15, height 5\' 10"  (1.778 m), weight 100.699 kg (222 lb), SpO2 95 %.  GENERAL:  69 y.o.-year-old patient lying in the bed with no acute distress.  EYES: Pupils equal, round, reactive to light and accommodation. No scleral icterus. Extraocular muscles intact.  HEENT: Head atraumatic, normocephalic. Oropharynx and nasopharynx clear.  NECK:  Supple, no jugular venous distention. No thyroid enlargement, no tenderness.  LUNGS: Normal breath sounds bilaterally, no wheezing, rales,rhonchi or crepitation. No use of accessory muscles of respiration.  CARDIOVASCULAR: S1, S2 normal. No murmurs, rubs, or gallops.  ABDOMEN: Soft, nontender, nondistended. Bowel sounds present. No organomegaly or mass.  EXTREMITIES: 1+ pedal edema, cyanosis, or clubbing.  NEUROLOGIC: Cranial nerves II through XII are intact. Muscle strength 5/5 in all extremities. Sensation intact. Gait not checked.  PSYCHIATRIC: The patient is alert and oriented x 3.  SKIN: No obvious rash, lesion, or ulcer.   LABORATORY PANEL:   CBC No results for input(s): WBC, HGB, HCT, PLT, MCV, MCH, MCHC, RDW, LYMPHSABS, MONOABS,  EOSABS, BASOSABS, BANDABS in the last 168 hours.  Invalid input(s): NEUTRABS, BANDSABD ------------------------------------------------------------------------------------------------------------------  Chemistries  No results for input(s): NA, K, CL, CO2, GLUCOSE, BUN, CREATININE, CALCIUM, MG, AST, ALT, ALKPHOS, BILITOT in the last 168 hours.  Invalid input(s): GFRCGP ------------------------------------------------------------------------------------------------------------------ CrCl cannot be calculated (Patient has no serum creatinine result on file.). ------------------------------------------------------------------------------------------------------------------ No results for input(s): TSH, T4TOTAL, T3FREE, THYROIDAB in the last 72 hours.  Invalid input(s): FREET3   Coagulation profile No results for input(s): INR, PROTIME in the last 168 hours. ------------------------------------------------------------------------------------------------------------------- No results for input(s): DDIMER in the last 72 hours. -------------------------------------------------------------------------------------------------------------------  Cardiac Enzymes No results for input(s): CKMB, TROPONINI, MYOGLOBIN in the last 168 hours.  Invalid input(s): CK ------------------------------------------------------------------------------------------------------------------ Invalid input(s): POCBNP  ---------------------------------------------------------------------------------------------------------------  Urinalysis    Component Value Date/Time   COLORURINE YELLOW 04/02/2013 0307   APPEARANCEUR CLEAR 04/02/2013 0307   LABSPEC 1.008  04/02/2013 0307   PHURINE 6.0 04/02/2013 0307   GLUCOSEU NEGATIVE 04/02/2013 0307   HGBUR NEGATIVE 04/02/2013 0307   BILIRUBINUR NEGATIVE 04/02/2013 0307   [place] 04/02/2013 0307   PROTEINUR 100* 04/02/2013 0307   UROBILINOGEN 0.2 04/02/2013  0307   NITRITE NEGATIVE 04/02/2013 0307   LEUKOCYTESUR NEGATIVE 04/02/2013 0307     RADIOLOGY: No results found.  EKG: Orders placed or performed in visit on 11/30/13  . EKG 12-Lead    IMPRESSION AND PLAN: Patient is a 69 year old white male with history of coronary artery disease who underwent a cardiac cath requiring dialysis in the past  1. Chronic renal failure patient underwent cardiac catheterization and need for hemodialysis. He will be admitted under observation nephrology consult will be placed. Then he will undergo dialysis.  2. Coronary artery disease: Medical management recommended per cardiology continue aspirin, Imdur and Ranexa  3. Diabetes type 2: Patient will be placed on sliding scale his Lantus will be continued  4. Hypertension continue hydralazine and Coreg  5. Miscellaneous he'll be on heparin for DVT prophylaxis     All the records are reviewed and case discussed with ED provider. Management plans discussed with the patient, family and they are in agreement.  CODE STATUS: Advance Directive Documentation        Most Recent Value   Type of Advance Directive  Healthcare Power of Attorney   Pre-existing out of facility DNR order (yellow form or pink MOST form)     "MOST" Form in Place?         TOTAL TIME TAKING CARE OF THIS PATIENT: 45 minutes.    Dustin Flock M.D on 07/30/2014 at 10:59 AM  Between 7am to 6pm - Pager - [phone]  After 6pm go to [email or web address] - password [email or web address] [facility]  Office  [phone]  CC: Primary care physician; Marden Noble, MD

## 2014-07-30 NOTE — Care Management (Signed)
Patient had elective cardiac cath and requires hemodialysis to clear the dye load.  He has a fistula in place but is not receiving chronic dialysis.  This treatment is urgent and could not be obtained in an outpatient clinic

## 2014-07-31 ENCOUNTER — Encounter: Payer: Self-pay | Admitting: Cardiovascular Disease

## 2014-07-31 DIAGNOSIS — I129 Hypertensive chronic kidney disease with stage 1 through stage 4 chronic kidney disease, or unspecified chronic kidney disease: Secondary | ICD-10-CM | POA: Diagnosis not present

## 2014-07-31 LAB — BASIC METABOLIC PANEL
ANION GAP: 7 (ref 5–15)
BUN: 36 mg/dL — ABNORMAL HIGH (ref 6–20)
CALCIUM: 7.5 mg/dL — AB (ref 8.9–10.3)
CHLORIDE: 107 mmol/L (ref 101–111)
CO2: 27 mmol/L (ref 22–32)
CREATININE: 3.03 mg/dL — AB (ref 0.61–1.24)
GFR calc Af Amer: 23 mL/min — ABNORMAL LOW (ref >60)
GFR calc non Af Amer: 20 mL/min — ABNORMAL LOW (ref >60)
GLUCOSE: 82 mg/dL (ref 65–99)
Potassium: 3.8 mmol/L (ref 3.5–5.1)
Sodium: 141 mmol/L (ref 135–145)

## 2014-07-31 LAB — CBC
HEMATOCRIT: 28.4 % — AB (ref 40.0–52.0)
HEMOGLOBIN: 9.7 g/dL — AB (ref 13.0–18.0)
MCH: 29.2 pg (ref 26.0–34.0)
MCHC: 34 g/dL (ref 32.0–36.0)
MCV: 86.1 fL (ref 80.0–100.0)
Platelets: 70 10*3/uL — ABNORMAL LOW (ref 150–440)
RBC: 3.3 MIL/uL — AB (ref 4.40–5.90)
RDW: 16.5 % — ABNORMAL HIGH (ref 11.5–14.5)
WBC: 4.7 10*3/uL (ref 3.8–10.6)

## 2014-07-31 LAB — GLUCOSE, CAPILLARY: Glucose-Capillary: 85 mg/dL (ref 65–99)

## 2014-07-31 MED ORDER — DIPHENHYDRAMINE HCL 25 MG PO CAPS
25.0000 mg | ORAL_CAPSULE | Freq: Four times a day (QID) | ORAL | Status: DC | PRN
  Administered 2014-07-31: 25 mg via ORAL
  Filled 2014-07-31: qty 1

## 2014-07-31 MED ORDER — RANOLAZINE ER 500 MG PO TB12: 500.0000 mg | ORAL_TABLET | Freq: Two times a day (BID) | ORAL | Status: DC

## 2014-07-31 NOTE — Progress Notes (Signed)
[facility]  Date of admission:  07/30/2014  Inpatient day:  07/30/2014  Reason for consultation: thrombocytopenia.   Chief Complaint: Joseph [name] is a 69 y.o. male who was admitted for cardiac catheterization.  HPI: The patient has a long-standing history of thrombocytopenia. Labs dating back to 02/04/2008 revealed a platelet count of 81,000. Counts have remained stable to the present time.  He has chronic liver disease and splenomegaly. He has a documented monoclonal gammopathy since at least 2014. Specifically, he has a IgG monoclonal protein with kappa light chain specificity.  Monoclonal spike has been stable at 1 gm/dL. 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  He has hepatosplenomegaly documented by ultrasound and CT. Last CT on 09/22/2013 the spleen of 14 cm. He has undergone a liver biopsy and by his report reveals fatty liver.  Bone marrow aspirate and biopsy on 11/07/2012. revelaed a low level of atypical plasma cells (5-10%) with excess kappa light chain expression. Marrow was mildly hypercellular for age (50-60%) .  There was no significant atypia or increased blasts. There was no evidence of a hematopoietic neoplasm or myeloproliferative disorder. Cytogenetics were normal.  He has been followed in the outpatient oncology clinic (last 05/26/2014). He has had serial CT scans following small retroperitoneal adenopathy (stable).  The patient has a history of chronic renal failure. He was on dialysis from 04/2013 until 11/2013.  Recently he has been having shortness of breath and chest pain. He was admitted for cardiac catheterization and subsequent dialysis to prevent further renal dysfunction.   On admission the patient's platelet count was 72,000.  He denies any bruising or bleeding.  Past Medical History  Diagnosis Date  . Hypertension   . Hypercholesterolemia   . Prostate cancer   . Renal disorder     Dr. Juleen China  .  Enlarged liver   . Spleen enlarged   . Thrombocytopenia   . Myocardial infarction   . Anginal pain   . Anxiety   . Depression   . Shortness of breath   . Diabetes mellitus without complication     INSULIN DEPENDENT  . GERD (gastroesophageal reflux disease)   . History of shingles   . CHF (congestive heart failure)   . Dialysis patient     currently on hold- Nov 2015    Past Surgical History  Procedure Laterality Date  . Cardiac surgery    . Shoulder arthroscopy    . Penile prosthesis implant    . Myocardial infarction    . Cardiac catheterization    . Coronary angioplasty    . Coronary artery bypass graft  2001  . Colonoscopy    . Liver biopsy    . Av fistula placement Left 2015    Family History  Problem Relation Age of Onset  . Acute myelogenous leukemia Brother   . CAD Brother   . Diabetes Mellitus II Brother     Social History:  reports that he quit smoking about 6 years ago. He has quit using smokeless tobacco. He reports that he does not drink alcohol or use illicit drugs.  The patient is alone today.  Allergies:  Allergies  Allergen Reactions  . Sulfa Antibiotics Itching    hives    Medications Prior to Admission  Medication Sig Dispense Refill  . ALPRAZolam (XANAX) 0.5 MG tablet Take 0.5 mg by mouth at bedtime.     [name] aspirin 81 MG tablet Take 81 mg by mouth daily.    [name]  carvedilol (COREG) 12.5 MG tablet Take 1 tablet (12.5 mg total) by mouth 2 (two) times daily with a meal. 60 tablet 0  . CRESTOR 10 MG tablet Take 10 mg by mouth 2 (two) times daily.     . hydrALAZINE (APRESOLINE) 50 MG tablet Take 50 mg by mouth 2 (two) times daily.     . isosorbide mononitrate (IMDUR) 30 MG 24 hr tablet Take 30 mg by mouth daily.    [name] LYRICA 75 MG capsule   0  . pantoprazole (PROTONIX) 40 MG tablet Take 40 mg by mouth daily.    . ranolazine (RANEXA) 500 MG 12 hr tablet Take 500 mg by mouth 2 (two) times daily.    . sodium bicarbonate 650 MG tablet Take 650 mg by mouth  4 (four) times daily.    [name] torsemide (DEMADEX) 20 MG tablet Take 20 mg by mouth daily.     . Vitamin D, Ergocalciferol, (DRISDOL) 50000 UNITS CAPS capsule Take 50,000 Units by mouth every 30 (thirty) days.    [name] LANTUS SOLOSTAR 100 UNIT/ML Solostar Pen Inject 40 Units into the skin at bedtime.     . [DISCONTINUED] allopurinol (ZYLOPRIM) 100 MG tablet Take 100 mg by mouth daily.     . [DISCONTINUED] amitriptyline (ELAVIL) 10 MG tablet Take 10 mg by mouth at bedtime.    . [DISCONTINUED] cyclobenzaprine (FLEXERIL) 5 MG tablet Take 1 tablet (5 mg total) by mouth 3 (three) times daily as needed for muscle spasms. 15 tablet 0  . [DISCONTINUED] insulin NPH-regular Human (NOVOLIN 70/30) (70-30) 100 UNIT/ML injection Inject 10-40 Units into the skin 2 (two) times daily with a meal. Inject 40 units every morning and depending on sugar, may inject and additional 10 units at bedtime.    . [DISCONTINUED] IRON PO Take 1 tablet by mouth daily.    . [DISCONTINUED] levofloxacin (LEVAQUIN) 750 MG tablet Take 1 tablet (750 mg total) by mouth every other day. 4 tablet 0  . [DISCONTINUED] losartan (COZAAR) 25 MG tablet Take 25 mg by mouth daily.       Review of Systems: GENERAL:  Feels good.  Active.  No fevers, sweats or weight loss. PERFORMANCE STATUS (ECOG):  1 HEENT:  No visual changes, runny nose, sore throat, mouth sores or tenderness. Lungs: Shortness of breath.  No cough.  No hemoptysis. Cardiac:  Chest pain precipitating admission.  No palpitations, orthopnea, or PND. GI:  No nausea, vomiting, diarrhea, constipation, melena or hematochezia. GU:  No urgency, frequency, dysuria, or hematuria. Musculoskeletal:  No back pain.  No joint pain.  No muscle tenderness. Extremities:  No pain or swelling. Skin:  No rashes or skin changes. Neuro:  No headache, numbness or weakness, balance or coordination issues. Endocrine:  No diabetes, thyroid issues, hot flashes or night sweats. Psych:  No mood changes,  depression or anxiety. Pain:  No focal pain. Review of systems:  All other systems reviewed and found to be negative.   Physical Exam:  Blood pressure 180/68, pulse 74, temperature 98.3 F (36.8 C), temperature source Oral, resp. rate 20, height $RemoveBe'5\' 10"'[id number]$  (1.778 m), weight 223 lb 5.2 oz (101.3 kg), SpO2 95 %.  GENERAL:  Well developed, well nourished, sitting comfortably on the medical unit in no acute distress. MENTAL STATUS:  Alert and oriented to person, place and time. HEAD:  Normocephalic, atraumatic, face symmetric, no Cushingoid features. EYES:  Pupils equal round and reactive to light and accomodation.  No conjunctivitis or scleral icterus. ENT:  Oropharynx  clear without lesion.  Tongue normal. Mucous membranes moist.  RESPIRATORY:  Clear to auscultation without rales, wheezes or rhonchi. CARDIOVASCULAR:  Regular rate and rhythm without murmur, rub or gallop. ABDOMEN:  Soft, non-tender, with active bowel sounds.  Spleen palpable.  No masses. SKIN:  No rashes, ulcers or lesions. EXTREMITIES: Chronic lower extremity changes.  No palpable cords. LYMPH NODES: No palpable cervical, supraclavicular, axillary or inguinal adenopathy  NEUROLOGICAL: Unremarkable. PSYCH:  Appropriate.   Results for orders placed or performed during the hospital encounter of 07/30/14 (from the past 48 hour(s))  Glucose, capillary     Status: Abnormal   Collection Time: 07/30/14 12:07 PM  Result Value Ref Range   Glucose-Capillary 139 (H) 65 - 99 mg/dL  Basic metabolic panel     Status: Abnormal   Collection Time: 07/30/14 12:41 PM  Result Value Ref Range   Sodium 141 135 - 145 mmol/L   Potassium 4.6 3.5 - 5.1 mmol/L   Chloride 112 (H) 101 - 111 mmol/L   CO2 24 22 - 32 mmol/L   Glucose, Bld 149 (H) 65 - 99 mg/dL   BUN 54 (H) 6 - 20 mg/dL   Creatinine, Ser 3.72 (H) 0.61 - 1.24 mg/dL   Calcium 7.8 (L) 8.9 - 10.3 mg/dL   GFR calc non Af Amer 16 (L) >60 mL/min   GFR calc Af Amer 18 (L) >60 mL/min     Comment: (NOTE) The eGFR has been calculated using the CKD EPI equation. This calculation has not been validated in all clinical situations. eGFR's persistently <60 mL/min signify possible Chronic Kidney Disease.    Anion gap 5 5 - 15  CBC     Status: Abnormal   Collection Time: 07/30/14 12:41 PM  Result Value Ref Range   WBC 4.7 3.8 - 10.6 K/uL   RBC 3.47 (L) 4.40 - 5.90 MIL/uL   Hemoglobin 9.9 (L) 13.0 - 18.0 g/dL   HCT 30.7 (L) 40.0 - 52.0 %   MCV 88.5 80.0 - 100.0 fL   MCH 28.6 26.0 - 34.0 pg   MCHC 32.3 32.0 - 36.0 g/dL   RDW 16.2 (H) 11.5 - 14.5 %   Platelets 77 (L) 150 - 440 K/uL  Glucose, capillary     Status: Abnormal   Collection Time: 07/30/14  6:42 PM  Result Value Ref Range   Glucose-Capillary 195 (H) 65 - 99 mg/dL  Glucose, capillary     Status: Abnormal   Collection Time: 07/30/14  8:56 PM  Result Value Ref Range   Glucose-Capillary 161 (H) 65 - 99 mg/dL   Comment 1 Notify RN    No results found.  Assessment:  The patient is a 69 y.o.  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  He has a monoclonal gammopathy of unknown significance (MGUS) which has been stable for at least 2 years.  His platelet count typically runs around 80,000.  Plan:   1)  Follow CBC with diff daily while hospitalized. 2)  No need for additional work-up unless platelet count falls.  Thank you for allowing me to participate in Joseph [name] 's care.  I will follow him closely with you while hospitalized and after discharge in the outpatient department.  Lequita Asal, MD  07/31/2014, 4:54 AM

## 2014-07-31 NOTE — Progress Notes (Signed)
Subjective:  Patient seen at bedside. Had HD yesterday. We decided to perform HD again today.    Objective:  Vital signs in last 24 hours:  Temp:  [97.4 F (36.3 C)-98.3 F (36.8 C)] 97.8 F (36.6 C) (06/24 1120) Pulse Rate:  [56-74] 57 (06/24 1300) Resp:  [16-25] 19 (06/24 1300) BP: (124-180)/(66-101) 159/70 mmHg (06/24 1300) SpO2:  [95 %-100 %] 97 % (06/24 0840) Weight:  [99.927 kg (220 lb 4.8 oz)-102.6 kg (226 lb 3.1 oz)] 102.6 kg (226 lb 3.1 oz) (06/24 1120)  Weight change:  Filed Weights   07/30/14 1740 07/31/14 0608 07/31/14 1120  Weight: 101.3 kg (223 lb 5.2 oz) 99.927 kg (220 lb 4.8 oz) 102.6 kg (226 lb 3.1 oz)    Intake/Output: I/O last 3 completed shifts: In: 720 [P.O.:720] Out: 950 [Urine:950]     Physical Exam: General: NAD, laying in bed  HEENT Anicteric, moist mucus membranes  Neck supple  Pulm/lungs Clear b/l normal effort  CVS/Heart S1S2 no rubs  Abdomen:  Soft, NTND, BS present  Extremities: No edema  Neurologic: Alert, oriented  Skin: No rashes  Access: Left arm AVF, good bruit and thrill       Basic Metabolic Panel:  Recent Labs Lab 07/30/14 1241 07/31/14 0426  NA 141 141  K 4.6 3.8  CL 112* 107  CO2 24 27  GLUCOSE 149* 82  BUN 54* 36*  CREATININE 3.72* 3.03*  CALCIUM 7.8* 7.5*     CBC:  Recent Labs Lab 07/30/14 1241 07/31/14 0426  WBC 4.7 4.7  HGB 9.9* 9.7*  HCT 30.7* 28.4*  MCV 88.5 86.1  PLT 77* 70*      Microbiology: No results found for this or any previous visit.  Coagulation Studies: No results for input(s): LABPROT, INR in the last 72 hours.  Urinalysis: No results for input(s): COLORURINE, LABSPEC, PHURINE, GLUCOSEU, HGBUR, BILIRUBINUR, KETONESUR, PROTEINUR, UROBILINOGEN, NITRITE, LEUKOCYTESUR in the last 72 hours.  Invalid input(s): APPERANCEUR    Imaging: No results found.   Medications:   . sodium chloride 75 mL/hr at 07/31/14 0015   . ALPRAZolam  0.5 mg Oral QHS  . aspirin  81 mg Oral  Daily  . carvedilol  12.5 mg Oral BID WC  . hydrALAZINE  50 mg Oral BID  . insulin glargine  20 Units Subcutaneous QHS  . isosorbide mononitrate  30 mg Oral Daily  . pantoprazole  40 mg Oral Daily  . pregabalin  150 mg Oral BID  . ranolazine  500 mg Oral BID  . rosuvastatin  10 mg Oral Daily  . sodium bicarbonate  650 mg Oral QID  . sodium chloride  3 mL Intravenous Q12H  . torsemide  20 mg Oral Daily  . [START ON 08/11/2014] Vitamin D (Ergocalciferol)  50,000 Units Oral Q30 days   sodium chloride, sodium chloride, acetaminophen **OR** acetaminophen, diphenhydrAMINE, feeding supplement (NEPRO CARB STEADY), lidocaine (PF), lidocaine-prilocaine, ondansetron **OR** ondansetron (ZOFRAN) IV, pentafluoroprop-tetrafluoroeth  Assessment/ Plan:  69 y.o. male coronary artery disease status post CABG, diabetes mellitus type 2, hypertension, prostate cancer, hyperlipidemia   1. CKD st 4 - Pt had HD yesterday and we have decided to perform HD yesterday, will perform HD again today, will need close outpt monitoring of renal function as well as his egfr was 16 prior to cath.  2. Anemia of CKD Hgb 9.7 at the moment, follow up as outpt, may end up needing epogen.  No indication for procrit  3. HTN: BP fluctuating, for now  continue coreg, hydralazine.     LOS: 1 Joseph [name] 6/24/20161:32 PM

## 2014-07-31 NOTE — Progress Notes (Signed)
Initial Nutrition Assessment     INTERVENTION:   Meals and Snacks: Cater to patient preferences; if FSBS poorly controlled, recommend addition of diabetic carb modified diet due to hx   NUTRITION DIAGNOSIS:  No nutrition diagnosis at this time  GOAL:  Patient will meet greater than or equal to 90% of their needs      MONITOR:   (Energy Intake, Anthropometrics, Electrolyte/Renal Profile, Digestive System)  REASON FOR ASSESSMENT:   (Diagnosis-CRF)    ASSESSMENT:  Pt admitted for cardiac cath, hx of HD-not on dialysis since Nov 2015, receiving HD post cath to clear dye; pt down for HD on visit today  PMHx:  Past Medical History  Diagnosis Date  . Hypertension   . Hypercholesterolemia   . Prostate cancer   . Renal disorder     Dr. Juleen China  . Enlarged liver   . Spleen enlarged   . Thrombocytopenia   . Myocardial infarction   . Anginal pain   . Anxiety   . Depression   . Shortness of breath   . Diabetes mellitus without complication     INSULIN DEPENDENT  . GERD (gastroesophageal reflux disease)   . History of shingles   . CHF (congestive heart failure)   . Dialysis patient     currently on hold- Nov 2015    Diet Order: Heart Healthy  Current Nutrition: recorded po intake 100% of meals  Food/Nutrition-Related History: unable to assess   Medications: NS at 75 ml/hr, lantus  Electrolyte/Renal Profile and Glucose Profile:   Recent Labs Lab 07/30/14 1241 07/31/14 0426  NA 141 141  K 4.6 3.8  CL 112* 107  CO2 24 27  BUN 54* 36*  CREATININE 3.72* 3.03*  CALCIUM 7.8* 7.5*  GLUCOSE 149* 82   Protein Profile: No results for input(s): ALBUMIN in the last 168 hours.  Anthropometrics:  Height:  Ht Readings from Last 1 Encounters:  07/30/14 5\' 10"  (1.778 m)    Weight:  Wt Readings from Last 1 Encounters:  07/31/14 226 lb 3.1 oz (102.6 kg)    Filed Weights   07/30/14 1740 07/31/14 0608 07/31/14 1120  Weight: 223 lb 5.2 oz (101.3 kg) 220 lb  4.8 oz (99.927 kg) 226 lb 3.1 oz (102.6 kg)     Wt Readings from Last 10 Encounters:  07/31/14 226 lb 3.1 oz (102.6 kg)  12/24/13 208 lb (94.348 kg)  12/08/13 211 lb (95.709 kg)  04/03/13 235 lb 4.8 oz (106.731 kg)    BMI:  Body mass index is 32.46 kg/(m^2).   Intake/Output Summary (Last 24 hours) at 07/31/14 1345 Last data filed at 07/31/14 1100  Gross per 24 hour  Intake    720 ml  Output   1150 ml  Net   -430 ml    LOW Care Level  Kerman Passey MS, RD, LDN [phone] Pager

## 2014-07-31 NOTE — Discharge Instructions (Signed)

## 2014-07-31 NOTE — Progress Notes (Signed)
Pt off floor to dialysis.  Called dialysis and told them that patient removed his tele this am and refused to put it back on but to see they can talk him into putting the monitor on while at dialysis and Juanda Crumble said that he would have on the dialysis tele monitor.

## 2014-07-31 NOTE — Progress Notes (Signed)
Discharge: Pt d/c from room via wheelchair,friend wiith pt. Discharge instructions given to the patient  No questions from pt, reintegrated to the pt to call or go to the ED for chest discomfort. Pt dressed in street clothes and left with discharge papers and prescriptions in hand. IV d/ced, tele removed and no complaints of pain or discomfort.

## 2014-07-31 NOTE — Discharge Summary (Signed)
[place] [facility] [place]   PATIENT NAME: Joseph [name]    MR#:  [id number]  DATE OF BIRTH:  09-26-1945  DATE OF ADMISSION:  07/30/2014 ADMITTING PHYSICIAN: Dionisio David, MD  DATE OF DISCHARGE: 07/31/2014  PRIMARY CARE PHYSICIAN: Marden Noble, MD    ADMISSION DIAGNOSIS:  chest pain  DISCHARGE DIAGNOSIS:  Active Problems:   CAD (coronary artery disease)   CRF (chronic renal failure)   SECONDARY DIAGNOSIS:   Past Medical History  Diagnosis Date  . Hypertension   . Hypercholesterolemia   . Prostate cancer   . Renal disorder     Dr. Juleen China  . Enlarged liver   . Spleen enlarged   . Thrombocytopenia   . Myocardial infarction   . Anginal pain   . Anxiety   . Depression   . Shortness of breath   . Diabetes mellitus without complication     INSULIN DEPENDENT  . GERD (gastroesophageal reflux disease)   . History of shingles   . CHF (congestive heart failure)   . Dialysis patient     currently on hold- Nov 2015    HOSPITAL COURSE:   Patient is a 69 year old white male with history of coronary artery disease who underwent a cardiac cath requiring dialysis in the past  1. Chronic renal failure patient underwent cardiac catheterization and need for hemodialysis. He was admitted for observation after cardiac catheterization. He received hemodialysis on 6/23 and also on 6/24. He will need close follow-up for renal function as his GFR was 16 prior to the cardiac catheterization.  2. Coronary artery disease: He underwent cardiac catheterization by Dr. Humphrey Rolls during the hospitalization. He had a stable high-grade lesion of the left main extending into the circumflex with occluded LAD. He will follow-up with Dr. Humphrey Rolls next week for platelet count and will likely have angioplasty at the end of next week with Dr. Terrence Dupont at [facility].  3. Diabetes type 2: Patient will be placed on sliding scale his Lantus will be  continued no changes to home regimen  4. Hypertension continue hydralazine and Coreg. No changes to home regimen  DISCHARGE CONDITIONS:   Stable  CONSULTS OBTAINED:  Treatment Team:  Dustin Flock, MD Murlean Iba, MD Lequita Asal, MD  DRUG ALLERGIES:   Allergies  Allergen Reactions  . Sulfa Antibiotics Itching    hives    DISCHARGE MEDICATIONS:   Current Discharge Medication List    CONTINUE these medications which have CHANGED   Details  ranolazine (RANEXA) 500 MG 12 hr tablet Take 1 tablet (500 mg total) by mouth 2 (two) times daily. Qty: 60 tablet, Refills: 1      CONTINUE these medications which have NOT CHANGED   Details  ALPRAZolam (XANAX) 0.5 MG tablet Take 0.5 mg by mouth at bedtime.     aspirin 81 MG tablet Take 81 mg by mouth daily.    calcitRIOL (ROCALTROL) 0.25 MCG capsule Take 0.25 mcg by mouth 3 (three) times a week. On Monday, Wednesday, and Friday    carvedilol (COREG) 12.5 MG tablet Take 1 tablet (12.5 mg total) by mouth 2 (two) times daily with a meal. Qty: 60 tablet, Refills: 0    CRESTOR 10 MG tablet Take 10 mg by mouth 2 (two) times daily.     hydrALAZINE (APRESOLINE) 50 MG tablet Take 50 mg by mouth 2 (two) times daily.     isosorbide mononitrate (IMDUR) 30 MG 24 hr tablet  Take 30 mg by mouth daily.    LANTUS SOLOSTAR 100 UNIT/ML Solostar Pen Inject 20 Units into the skin at bedtime.     LYRICA 75 MG capsule Take 75 mg by mouth 2 (two) times daily.  Refills: 0    sodium bicarbonate 650 MG tablet Take 650 mg by mouth 2 (two) times daily.     sodium polystyrene (KAYEXALATE) 15 GM/60ML suspension Take 15 g by mouth 3 (three) times a week.    torsemide (DEMADEX) 20 MG tablet Take 40 mg by mouth daily. On non-dialysis days    Vitamin D, Ergocalciferol, (DRISDOL) 50000 UNITS CAPS capsule Take 50,000 Units by mouth every 30 (thirty) days.      STOP taking these medications     pantoprazole (PROTONIX) 40 MG tablet           DISCHARGE INSTRUCTIONS:   DIET:  Cardiac diet  DISCHARGE CONDITION:  Stable  ACTIVITY:  Activity as tolerated  OXYGEN:  Home Oxygen: No.   Oxygen Delivery: room air  DISCHARGE LOCATION:  home    If you experience worsening of your admission symptoms, develop shortness of breath, life threatening emergency, suicidal or homicidal thoughts you must seek medical attention immediately by calling 911 or calling your MD immediately  if symptoms less severe.  You Must read complete instructions/literature along with all the possible adverse reactions/side effects for all the Medicines you take and that have been prescribed to you. Take any new Medicines after you have completely understood and accept all the possible adverse reactions/side effects.   Please note  You were cared for by a hospitalist during your hospital stay. If you have any questions about your discharge medications or the care you received while you were in the hospital after you are discharged, you can call the unit and asked to speak with the hospitalist on call if the hospitalist that took care of you is not available. Once you are discharged, your primary care physician will handle any further medical issues. Please note that NO REFILLS for any discharge medications will be authorized once you are discharged, as it is imperative that you return to your primary care physician (or establish a relationship with a primary care physician if you do not have one) for your aftercare needs so that they can reassess your need for medications and monitor your lab values.    Today   CHIEF COMPLAINT:  No chief complaint on file.   HISTORY OF PRESENT ILLNESS:  Joseph [name] is a 69 y.o. male with a known history of hypertension, hyperlipidemia chronic renal failure, coronary artery disease, cardiomyopathy with systolic dysfunction who in the past was on dialysis from March 2015 to October 2015 then his renal function  improved and he was taken off dialysis. Patient has been having chest pain and shortness of breath therefore Dr. Humphrey Rolls did a cardiac catheterization earlier today and he wanted the patient to be dialyzed due to his history of requiring hemodialysis. Patient reports that his been having on and off chest pain for the past few days and he has chronic shortness of breath. Otherwise denies any nausea vomiting diarrhea denies any urinary symptoms.  VITAL SIGNS:  Blood pressure 174/109, pulse 63, temperature 97.7 F (36.5 C), temperature source Oral, resp. rate 18, height 5\' 10"  (1.778 m), weight 102.2 kg (225 lb 5 oz), SpO2 97 %.  I/O:   Intake/Output Summary (Last 24 hours) at 07/31/14 1537 Last data filed at 07/31/14 1430  Gross per 24  hour  Intake    720 ml  Output   1150 ml  Net   -430 ml    PHYSICAL EXAMINATION:  GENERAL:  69 y.o.-year-old patient sitting up with no acute distress.  EYES: Pupils equal, round, reactive to light and accommodation. No scleral icterus. Extraocular muscles intact.  HEENT: Head atraumatic, normocephalic. Oropharynx and nasopharynx clear.  NECK:  Supple, no jugular venous distention. No thyroid enlargement, no tenderness.  LUNGS: Normal breath sounds bilaterally, no wheezing, rales,rhonchi or crepitation. No use of accessory muscles of respiration.  CARDIOVASCULAR: S1, S2 normal. Or out of 6 systolic ejection murmurs, rubs, or gallops.  ABDOMEN: Soft, non-tender, non-distended. Bowel sounds present. No organomegaly or mass.  EXTREMITIES: No pedal edema, cyanosis, or clubbing.  NEUROLOGIC: Cranial nerves II through XII are intact. Muscle strength 5/5 in all extremities. Sensation intact. Gait not checked.  PSYCHIATRIC: The patient is alert and oriented x 3.  SKIN: No obvious rash, lesion, or ulcer.   DATA REVIEW:   CBC  Recent Labs Lab 07/31/14 0426  WBC 4.7  HGB 9.7*  HCT 28.4*  PLT 70*    Chemistries   Recent Labs Lab 07/31/14 0426  NA 141  K  3.8  CL 107  CO2 27  GLUCOSE 82  BUN 36*  CREATININE 3.03*  CALCIUM 7.5*    Cardiac Enzymes No results for input(s): TROPONINI in the last 168 hours.  Microbiology Results  No results found for this or any previous visit.  RADIOLOGY:  No results found.  EKG:   Orders placed or performed in visit on 11/30/13  . EKG 12-Lead      Management plans discussed with the patient, family and they are in agreement.  CODE STATUS:     Code Status Orders        Start     Ordered   07/30/14 1207  Full code   Continuous     07/30/14 1206    Advance Directive Documentation        Most Recent Value   Type of Advance Directive  Healthcare Power of Attorney   Pre-existing out of facility DNR order (yellow form or pink MOST form)     "MOST" Form in Place?        TOTAL TIME TAKING CARE OF THIS PATIENT: 35 minutes.    Myrtis Ser M.D on 07/31/2014 at 3:37 PM  Between 7am to 6pm - Pager - [phone]  After 6pm go to [email or web address] - password [email or web address]  Office  [phone]  CC: Primary care physician; Marden Noble, MD

## 2014-07-31 NOTE — Plan of Care (Signed)
Problem: Phase I Progression Outcomes Goal: Hemodynamically stable Outcome: Progressing Pt SBP 150-180s/60-80s pt received evening scheduled BP meds. HR NSR 50-70s, on room air SpO2 >90%.

## 2014-07-31 NOTE — Progress Notes (Signed)
SUBJECTIVE: Patient denies any chest pain or shortness    Filed Vitals:   07/30/14 2054 07/31/14 0608 07/31/14 0840 07/31/14 0840  BP: 180/68 156/84 156/78 156/78  Pulse: 74 63 69 68  Temp: 98.3 F (36.8 C) 97.4 F (36.3 C) 98.2 F (36.8 C)   TempSrc: Oral Oral Oral   Resp: 20 16 18    Height:      Weight:  99.927 kg (220 lb 4.8 oz)    SpO2: 95% 96% 98% 97%    Intake/Output Summary (Last 24 hours) at 07/31/14 0905 Last data filed at 07/31/14 2035  Gross per 24 hour  Intake    720 ml  Output    950 ml  Net   -230 ml    LABS: Basic Metabolic Panel:  Recent Labs  07/30/14 1241 07/31/14 0426  NA 141 141  K 4.6 3.8  CL 112* 107  CO2 24 27  GLUCOSE 149* 82  BUN 54* 36*  CREATININE 3.72* 3.03*  CALCIUM 7.8* 7.5*   Liver Function Tests: No results for input(s): AST, ALT, ALKPHOS, BILITOT, PROT, ALBUMIN in the last 72 hours. No results for input(s): LIPASE, AMYLASE in the last 72 hours. CBC:  Recent Labs  07/30/14 1241 07/31/14 0426  WBC 4.7 4.7  HGB 9.9* 9.7*  HCT 30.7* 28.4*  MCV 88.5 86.1  PLT 77* 70*   Cardiac Enzymes: No results for input(s): CKTOTAL, CKMB, CKMBINDEX, TROPONINI in the last 72 hours. BNP: Invalid input(s): POCBNP D-Dimer: No results for input(s): DDIMER in the last 72 hours. Hemoglobin A1C: No results for input(s): HGBA1C in the last 72 hours. Fasting Lipid Panel: No results for input(s): CHOL, HDL, LDLCALC, TRIG, CHOLHDL, LDLDIRECT in the last 72 hours. Thyroid Function Tests: No results for input(s): TSH, T4TOTAL, T3FREE, THYROIDAB in the last 72 hours.  Invalid input(s): FREET3 Anemia Panel: No results for input(s): VITAMINB12, FOLATE, FERRITIN, TIBC, IRON, RETICCTPCT in the last 72 hours.   PHYSICAL EXAM General: Well developed, well nourished, in no acute distress HEENT:  Normocephalic and atramatic Neck:  No JVD.  Lungs: Clear bilaterally to auscultation and percussion. Heart: HRRR . Normal S1 and S2 without gallops or  murmurs.  Abdomen: Bowel sounds are positive, abdomen soft and non-tender  Msk:  Back normal, normal gait. Normal strength and tone for age. Extremities: No clubbing, cyanosis or edema.   Neuro: Alert and oriented X 3. Psych:  Good affect, responds appropriately  TELEMETRY: Sinus rhythm  ASSESSMENT AND PLAN: Stable angina/high-grade lesion and of left main extending into left circumflex with occluded LAD but LIMA to the LAD was patent with collaterals to the right coronary artery which was also occluded. I discussed with Dr. Charolette Forward who feels that the patient still can have angioplasty of the circumflex and left main because left main is protected. He was seen by hematology and they feel that his platelet count always been running between 80 and 85 and this is baseline for him due to splenomegaly and gammpathy. He can be discharged on aspirin with follow-up on Monday at 3 PM. Patient was discussed with Dr. Terrence Dupont and it was felt that patient should should be given rest for one week to his kidneys. Since he will get more dye during angioplasty. He also will have platelet count checked on Monday again since it dropped to 70 after getting a dose of heparin. As long as platelet count is around 80 we'll proceed with angioplasty End of next week with Dr. Terrence Dupont at [facility]. He can be sent home with follow-up in the office Monday at 3 PM  Active Problems:   CRF (chronic renal failure)    Neoma Laming A, MD, [facility] 07/31/2014 9:05 AM

## 2014-07-31 NOTE — Progress Notes (Signed)
Pt alert and oriented x4, no complaints of pain or discomfort.  Bed in low position, call bell within reach.  Bed alarms on and functioning.  Assessment done and charted.  Will continue to monitor and do hourly rounding throughout the shift.  Pt is very anxious about wanting to go home after dialysis this evening.

## 2014-08-05 ENCOUNTER — Encounter (HOSPITAL_COMMUNITY): Payer: Self-pay

## 2014-08-05 ENCOUNTER — Emergency Department (HOSPITAL_COMMUNITY): Payer: Medicare Other

## 2014-08-05 ENCOUNTER — Inpatient Hospital Stay (HOSPITAL_COMMUNITY)
Admission: EM | Admit: 2014-08-05 | Discharge: 2014-08-08 | DRG: 247 | Disposition: A | Discharge status: Home / Self Care | Payer: Medicare Other | Attending: Cardiology | Admitting: Cardiology

## 2014-08-05 DIAGNOSIS — Z8546 Personal history of malignant neoplasm of prostate: Secondary | ICD-10-CM

## 2014-08-05 DIAGNOSIS — Z87891 Personal history of nicotine dependence: Secondary | ICD-10-CM

## 2014-08-05 DIAGNOSIS — I214 Non-ST elevation (NSTEMI) myocardial infarction: Secondary | ICD-10-CM | POA: Diagnosis not present

## 2014-08-05 DIAGNOSIS — K219 Gastro-esophageal reflux disease without esophagitis: Secondary | ICD-10-CM | POA: Diagnosis present

## 2014-08-05 DIAGNOSIS — N184 Chronic kidney disease, stage 4 (severe): Secondary | ICD-10-CM | POA: Diagnosis present

## 2014-08-05 DIAGNOSIS — Z882 Allergy status to sulfonamides status: Secondary | ICD-10-CM | POA: Diagnosis not present

## 2014-08-05 DIAGNOSIS — Z79899 Other long term (current) drug therapy: Secondary | ICD-10-CM | POA: Diagnosis not present

## 2014-08-05 DIAGNOSIS — D649 Anemia, unspecified: Secondary | ICD-10-CM | POA: Diagnosis present

## 2014-08-05 DIAGNOSIS — E780 Pure hypercholesterolemia: Secondary | ICD-10-CM | POA: Diagnosis present

## 2014-08-05 DIAGNOSIS — I25110 Atherosclerotic heart disease of native coronary artery with unstable angina pectoris: Secondary | ICD-10-CM | POA: Diagnosis present

## 2014-08-05 DIAGNOSIS — Z7982 Long term (current) use of aspirin: Secondary | ICD-10-CM

## 2014-08-05 DIAGNOSIS — E1122 Type 2 diabetes mellitus with diabetic chronic kidney disease: Secondary | ICD-10-CM | POA: Diagnosis present

## 2014-08-05 DIAGNOSIS — Z794 Long term (current) use of insulin: Secondary | ICD-10-CM

## 2014-08-05 DIAGNOSIS — I2510 Atherosclerotic heart disease of native coronary artery without angina pectoris: Secondary | ICD-10-CM | POA: Diagnosis not present

## 2014-08-05 DIAGNOSIS — I129 Hypertensive chronic kidney disease with stage 1 through stage 4 chronic kidney disease, or unspecified chronic kidney disease: Secondary | ICD-10-CM | POA: Diagnosis present

## 2014-08-05 DIAGNOSIS — D696 Thrombocytopenia, unspecified: Secondary | ICD-10-CM | POA: Diagnosis present

## 2014-08-05 DIAGNOSIS — Z951 Presence of aortocoronary bypass graft: Secondary | ICD-10-CM | POA: Diagnosis not present

## 2014-08-05 LAB — CBC
HEMATOCRIT: 27.2 % — AB (ref 39.0–52.0)
Hemoglobin: 9.1 g/dL — ABNORMAL LOW (ref 13.0–17.0)
MCH: 29.3 pg (ref 26.0–34.0)
MCHC: 33.5 g/dL (ref 30.0–36.0)
MCV: 87.5 fL (ref 78.0–100.0)
PLATELETS: 86 10*3/uL — AB (ref 150–400)
RBC: 3.11 MIL/uL — ABNORMAL LOW (ref 4.22–5.81)
RDW: 15.4 % (ref 11.5–15.5)
WBC: 5.8 10*3/uL (ref 4.0–10.5)

## 2014-08-05 LAB — BASIC METABOLIC PANEL
ANION GAP: 10 (ref 5–15)
BUN: 43 mg/dL — ABNORMAL HIGH (ref 6–20)
CO2: 24 mmol/L (ref 22–32)
Calcium: 7.4 mg/dL — ABNORMAL LOW (ref 8.9–10.3)
Chloride: 105 mmol/L (ref 101–111)
Creatinine, Ser: 3.83 mg/dL — ABNORMAL HIGH (ref 0.61–1.24)
GFR calc Af Amer: 17 mL/min — ABNORMAL LOW (ref >60)
GFR calc non Af Amer: 15 mL/min — ABNORMAL LOW (ref >60)
GLUCOSE: 280 mg/dL — AB (ref 65–99)
Potassium: 4.4 mmol/L (ref 3.5–5.1)
SODIUM: 139 mmol/L (ref 135–145)

## 2014-08-05 LAB — I-STAT TROPONIN, ED: Troponin i, poc: 0.1 ng/mL (ref 0.00–0.08)

## 2014-08-05 LAB — BRAIN NATRIURETIC PEPTIDE: B NATRIURETIC PEPTIDE 5: 1301.3 pg/mL — AB (ref 0.0–100.0)

## 2014-08-05 MED ORDER — HEPARIN (PORCINE) IN NACL 100-0.45 UNIT/ML-% IJ SOLN: 12.0000 [IU]/kg/h | INTRAMUSCULAR | Status: DC

## 2014-08-05 MED ORDER — HEPARIN (PORCINE) IN NACL 100-0.45 UNIT/ML-% IJ SOLN
1500.0000 [IU]/h | INTRAMUSCULAR | Status: DC
  Administered 2014-08-05: 1300 [IU]/h via INTRAVENOUS
  Administered 2014-08-06: 1500 [IU]/h via INTRAVENOUS
  Filled 2014-08-05 (×2): qty 250

## 2014-08-05 MED ORDER — CLOPIDOGREL BISULFATE 300 MG PO TABS
600.0000 mg | ORAL_TABLET | Freq: Once | ORAL | Status: AC
  Administered 2014-08-05: 600 mg via ORAL
  Filled 2014-08-05: qty 2

## 2014-08-05 MED ORDER — HEPARIN SODIUM (PORCINE) 5000 UNIT/ML IJ SOLN
4000.0000 [IU] | Freq: Once | INTRAMUSCULAR | Status: AC
  Administered 2014-08-05: 4000 [IU] via INTRAVENOUS

## 2014-08-05 MED ORDER — ASPIRIN 81 MG PO CHEW
324.0000 mg | CHEWABLE_TABLET | Freq: Once | ORAL | Status: AC
  Administered 2014-08-05: 324 mg via ORAL
  Filled 2014-08-05: qty 4

## 2014-08-05 NOTE — ED Notes (Signed)
Pt reports he was a dialysis patient last year from March 2015 to October 2015 then his renal function improved and he was taken off Dialysis in October. Pt has been having chest pain and SOB so Dr. Humphrey Rolls did cardiac cath on 07/30/14 and the patient was then dialyzed after the cath due to his history of requiring hemodialysis.

## 2014-08-05 NOTE — ED Notes (Signed)
PER EMS: pt from home, reports left sided intermittent non-radiating chest pain all day. Had cardiac cath last Wednesday and doctor was unable to place a stent but he was informed he does have a blockage "all over" and only has "one main artery that works" in his heart. EMS gave 1 nitro en route with relief, pain went from 6/10 to 0/10. BP - 139/84, HR-81. A&OX4. Denies CP right now.

## 2014-08-05 NOTE — Progress Notes (Signed)
ANTICOAGULATION CONSULT NOTE - Initial Consult  Pharmacy Consult for Heparin Indication: chest pain/ACS  Allergies  Allergen Reactions  . Sulfa Antibiotics Itching    hives    Patient Measurements:    Ht: 70 in  Wt: 102 kg  IBW: 73 kg Heparin Dosing Weight: 94 kg  Vital Signs: Temp: 98.8 F (37.1 C) (06/29 2056) Temp Source: Oral (06/29 2056) BP: 134/56 mmHg (06/29 2230) Pulse Rate: 60 (06/29 2230)  Labs:  Recent Labs  08/05/14 2113  HGB 9.1*  HCT 27.2*  PLT 86*  CREATININE 3.83*    Estimated Creatinine Clearance: 22.7 mL/min (by C-G formula based on Cr of 3.83).   Medical History: Past Medical History  Diagnosis Date  . Hypertension   . Hypercholesterolemia   . Prostate cancer   . Renal disorder     Dr. Juleen China  . Enlarged liver   . Spleen enlarged   . Thrombocytopenia   . Myocardial infarction   . Anginal pain   . Anxiety   . Depression   . Shortness of breath   . Diabetes mellitus without complication     INSULIN DEPENDENT  . GERD (gastroesophageal reflux disease)   . History of shingles   . CHF (congestive heart failure)   . Dialysis patient     currently on hold- Nov 2015    Medications:  See electronic med rec  Assessment: 69 y.o. male presents with CP. To begin heparin for r/o ACS. Hgb 9.1, plt 86 (plt 70s during last admission ~1 wk ago and pt with h/o thrombocytopenia)  Goal of Therapy:  Heparin level 0.3-0.7 units/ml Monitor platelets by anticoagulation protocol: Yes   Plan:  Heparin IV bolus 4000 units Heparin IV gtt at 1300 units/hr Will f/u 8 hr heparin level Daily heparin level and CBC  Sherlon Handing, PharmD, BCPS Clinical pharmacist, pager [phone] 08/05/2014,10:47 PM

## 2014-08-05 NOTE — ED Provider Notes (Signed)
CSN: [id number]     Arrival date & time 08/05/14  2052 History   First MD Initiated Contact with Patient 08/05/14 2055     Chief Complaint  Patient presents with  . Chest Pain     (Consider location/radiation/quality/duration/timing/severity/associated sxs/prior Treatment) Patient is a 69 y.o. male presenting with chest pain. The history is provided by the patient.  Chest Pain Pain location:  Substernal area Pain quality: pressure   Pain radiates to:  Does not radiate Pain radiates to the back: no   Pain severity:  Moderate Onset quality:  Gradual Duration:  1 day (has normally but worse today) Timing:  Constant Progression:  Waxing and waning Chronicity:  New Context: at rest (and worse with exertion)   Relieved by:  Nothing Worsened by:  Nothing tried Ineffective treatments:  None tried Associated symptoms: lower extremity edema and shortness of breath   Associated symptoms: no syncope   Risk factors: coronary artery disease (2 vessel 70 % stenosis)     Past Medical History  Diagnosis Date  . Hypertension   . Hypercholesterolemia   . Prostate cancer   . Renal disorder     Dr. Juleen China  . Enlarged liver   . Spleen enlarged   . Thrombocytopenia   . Myocardial infarction   . Anginal pain   . Anxiety   . Depression   . Shortness of breath   . Diabetes mellitus without complication     INSULIN DEPENDENT  . GERD (gastroesophageal reflux disease)   . History of shingles   . CHF (congestive heart failure)   . Dialysis patient     currently on hold- Nov 2015   Past Surgical History  Procedure Laterality Date  . Cardiac surgery    . Shoulder arthroscopy    . Penile prosthesis implant    . Myocardial infarction    . Cardiac catheterization    . Coronary angioplasty    . Coronary artery bypass graft  2001  . Colonoscopy    . Liver biopsy    . Av fistula placement Left 2015  . Cardiac catheterization N/A 07/30/2014    Procedure: Left Heart Cath and Cors/Grafts  Angiography;  Surgeon: Dionisio David, MD;  Location: [facility];  Service: Cardiovascular;  Laterality: N/A;   Family History  Problem Relation Age of Onset  . Acute myelogenous leukemia Brother   . CAD Brother   . Diabetes Mellitus II Brother    History  Substance Use Topics  . Smoking status: Former Smoker    Quit date: 02/07/2008  . Smokeless tobacco: Former Systems developer  . Alcohol Use: No    Review of Systems  Respiratory: Positive for shortness of breath.   Cardiovascular: Positive for chest pain. Negative for syncope.  All other systems reviewed and are negative.     Allergies  Sulfa antibiotics  Home Medications   Prior to Admission medications   Medication Sig Start Date End Date Taking? Authorizing Provider  aspirin 81 MG tablet Take 81 mg by mouth daily.   Yes Historical Provider, MD  calcitRIOL (ROCALTROL) 0.25 MCG capsule Take 0.25 mcg by mouth 3 (three) times a week. On Monday, Wednesday, and Friday   Yes Historical Provider, MD  carvedilol (COREG) 12.5 MG tablet Take 1 tablet (12.5 mg total) by mouth 2 (two) times daily with a meal. 04/03/13  Yes Thurnell Lose, MD  CRESTOR 10 MG tablet Take 10 mg by mouth 2 (two) times daily.  02/11/13  Yes  Historical Provider, MD  hydrALAZINE (APRESOLINE) 50 MG tablet Take 50 mg by mouth 2 (two) times daily.  03/20/13  Yes Historical Provider, MD  isosorbide mononitrate (IMDUR) 30 MG 24 hr tablet Take 30 mg by mouth daily.   Yes Historical Provider, MD  LANTUS SOLOSTAR 100 UNIT/ML Solostar Pen Inject 20 Units into the skin at bedtime.    Yes Historical Provider, MD  LYRICA 75 MG capsule Take 75 mg by mouth 2 (two) times daily.    Yes Historical Provider, MD  ranolazine (RANEXA) 500 MG 12 hr tablet Take 1 tablet (500 mg total) by mouth 2 (two) times daily. 07/31/14  Yes Aldean Jewett, MD  sodium bicarbonate 650 MG tablet Take 650 mg by mouth 2 (two) times daily.    Yes Historical Provider, MD  sodium polystyrene  (KAYEXALATE) 15 GM/60ML suspension Take 15 g by mouth 3 (three) times a week. Mon / Wed / Fri   Yes Historical Provider, MD  torsemide (DEMADEX) 20 MG tablet Take 40 mg by mouth daily as needed (edema).    Yes Historical Provider, MD  Vitamin D, Ergocalciferol, (DRISDOL) 50000 UNITS CAPS capsule Take 50,000 Units by mouth every 30 (thirty) days.   Yes Historical Provider, MD  ALPRAZolam Duanne Moron) 0.5 MG tablet Take 0.5 mg by mouth at bedtime as needed for anxiety or sleep.  03/30/13   Historical Provider, MD   BP 146/73 mmHg  Pulse 62  Temp(Src) 98.8 F (37.1 C) (Oral)  Resp 21  SpO2 95% Physical Exam  Constitutional: He is oriented to person, place, and time. He appears well-developed and well-nourished. No distress.  HENT:  Head: Normocephalic and atraumatic.  Eyes: Conjunctivae are normal.  Neck: Neck supple. No tracheal deviation present.  Cardiovascular: Normal rate and regular rhythm.   Murmur heard.  Systolic murmur is present with a grade of 3/6  Pulmonary/Chest: Effort normal. No respiratory distress. He has rales (bibasilar).  Abdominal: Soft. He exhibits no distension.  Neurological: He is alert and oriented to person, place, and time.  Skin: Skin is warm and dry.  Psychiatric: He has a normal mood and affect.    ED Course  Procedures (including critical care time) Labs Review Labs Reviewed  CBC - Abnormal; Notable for the following:    RBC 3.11 (*)    Hemoglobin 9.1 (*)    HCT 27.2 (*)    Platelets 86 (*)    All other components within normal limits  BASIC METABOLIC PANEL - Abnormal; Notable for the following:    Glucose, Bld 280 (*)    BUN 43 (*)    Creatinine, Ser 3.83 (*)    Calcium 7.4 (*)    GFR calc non Af Amer 15 (*)    GFR calc Af Amer 17 (*)    All other components within normal limits  BRAIN NATRIURETIC PEPTIDE - Abnormal; Notable for the following:    B Natriuretic Peptide 1301.3 (*)    All other components within normal limits  I-STAT TROPOININ, ED  - Abnormal; Notable for the following:    Troponin i, poc 0.10 (*)    All other components within normal limits  HEPARIN LEVEL (UNFRACTIONATED)    Imaging Review Dg Chest 2 View  08/05/2014   CLINICAL DATA:  Chest pain and dyspnea for 1 week.  EXAM: CHEST  2 VIEW  COMPARISON:  11/30/2013  FINDINGS: There is mild unchanged cardiomegaly. There is prior sternotomy and CABG. The lungs are clear. The pulmonary vasculature is normal.  There are no pleural effusions. Hilar and mediastinal contours are unremarkable and unchanged.  IMPRESSION: Mild cardiomegaly.  No acute cardiopulmonary findings.   Electronically Signed   By: Andreas Newport M.D.   On: 08/05/2014 21:32     EKG Interpretation   Date/Time:  Wednesday August 05 2014 20:54:48 EDT Ventricular Rate:  77 PR Interval:  186 QRS Duration: 133 QT Interval:  409 QTC Calculation: 463 R Axis:   64 Text Interpretation:  Sinus rhythm Atrial premature complex Probable left  atrial enlargement Nonspecific intraventricular conduction delay Probable  inferior infarct, old No significant change since last tracing Confirmed  by [facility]  MD, CHRISTOPHER [id number]) on 08/05/2014 9:10:59 PM      MDM   Final diagnoses:  NSTEMI (non-ST elevated myocardial infarction)   64 roll male with known coronary artery disease, 100% occlusion of the RCA, 70% stenosis of 2 other vessels, followed by Dr. Chancy Milroy at an outside cardiology practice presents with intermittent chest pain over the course of the day that is worse than his typical episodes. He has been having ongoing shortness of breath that has been progressive in nature which prompted his initial evaluation and catheterization 3 days ago where he received no intervention. He is a poor surgical candidate. Here he has a positive troponin and I'm concerned for unstable angina. After discussion with Dr. Susy Manor who is on call with cardiology he was loaded with Plavix, heparinized, and will be admitted to the hospital  for presumed ACS. He remained hemodynamically stable throughout his emergency department stay, he will require monitoring as his platelets are low chronically and he may require intervention at some point. Dr Susy Manor was available to see the patient in emergency department and he will be admitted to cardiology telemetry bed.  Leo Grosser, MD 08/06/14 5852  Orpah Greek, MD 08/06/14 [phone]

## 2014-08-06 ENCOUNTER — Inpatient Hospital Stay (HOSPITAL_COMMUNITY): Payer: Medicare Other

## 2014-08-06 DIAGNOSIS — I2510 Atherosclerotic heart disease of native coronary artery without angina pectoris: Secondary | ICD-10-CM

## 2014-08-06 DIAGNOSIS — I25110 Atherosclerotic heart disease of native coronary artery with unstable angina pectoris: Secondary | ICD-10-CM

## 2014-08-06 DIAGNOSIS — I214 Non-ST elevation (NSTEMI) myocardial infarction: Secondary | ICD-10-CM

## 2014-08-06 DIAGNOSIS — D696 Thrombocytopenia, unspecified: Secondary | ICD-10-CM

## 2014-08-06 DIAGNOSIS — N184 Chronic kidney disease, stage 4 (severe): Secondary | ICD-10-CM

## 2014-08-06 LAB — TROPONIN I
TROPONIN I: 0.08 ng/mL — AB (ref <0.031)
Troponin I: 2.08 ng/mL (ref <0.031)
Troponin I: 1.8 ng/mL (ref <0.031)

## 2014-08-06 LAB — URINALYSIS, ROUTINE W REFLEX MICROSCOPIC
Bilirubin Urine: NEGATIVE
Glucose, UA: 250 mg/dL — AB
Ketones, ur: NEGATIVE mg/dL
LEUKOCYTES UA: NEGATIVE
NITRITE: NEGATIVE
Protein, ur: >300 mg/dL — AB
SPECIFIC GRAVITY, URINE: 1.018 (ref 1.005–1.030)
Urobilinogen, UA: 0.2 mg/dL (ref 0.0–1.0)
pH: 7 (ref 5.0–8.0)

## 2014-08-06 LAB — COMPREHENSIVE METABOLIC PANEL
ALK PHOS: 86 U/L (ref 38–126)
ALT: 13 U/L — AB (ref 17–63)
AST: 23 U/L (ref 15–41)
Albumin: 2.8 g/dL — ABNORMAL LOW (ref 3.5–5.0)
Anion gap: 7 (ref 5–15)
BUN: 45 mg/dL — ABNORMAL HIGH (ref 6–20)
CHLORIDE: 109 mmol/L (ref 101–111)
CO2: 24 mmol/L (ref 22–32)
CREATININE: 3.9 mg/dL — AB (ref 0.61–1.24)
Calcium: 7.6 mg/dL — ABNORMAL LOW (ref 8.9–10.3)
GFR calc Af Amer: 17 mL/min — ABNORMAL LOW (ref >60)
GFR, EST NON AFRICAN AMERICAN: 15 mL/min — AB (ref >60)
GLUCOSE: 109 mg/dL — AB (ref 65–99)
Potassium: 3.7 mmol/L (ref 3.5–5.1)
Sodium: 140 mmol/L (ref 135–145)
TOTAL PROTEIN: 6.3 g/dL — AB (ref 6.5–8.1)
Total Bilirubin: 0.7 mg/dL (ref 0.3–1.2)

## 2014-08-06 LAB — GLUCOSE, CAPILLARY
GLUCOSE-CAPILLARY: 117 mg/dL — AB (ref 65–99)
Glucose-Capillary: 225 mg/dL — ABNORMAL HIGH (ref 65–99)
Glucose-Capillary: 89 mg/dL (ref 65–99)

## 2014-08-06 LAB — URINE MICROSCOPIC-ADD ON

## 2014-08-06 LAB — HEPARIN LEVEL (UNFRACTIONATED)
Heparin Unfractionated: 0.25 IU/mL — ABNORMAL LOW (ref 0.30–0.70)
Heparin Unfractionated: 0.19 IU/mL — ABNORMAL LOW (ref 0.30–0.70)

## 2014-08-06 LAB — OCCULT BLOOD X 1 CARD TO LAB, STOOL: Fecal Occult Bld: NEGATIVE

## 2014-08-06 MED ORDER — RANOLAZINE ER 500 MG PO TB12
500.0000 mg | ORAL_TABLET | Freq: Two times a day (BID) | ORAL | Status: DC
  Administered 2014-08-06 – 2014-08-07 (×4): 500 mg via ORAL
  Filled 2014-08-06 (×5): qty 1

## 2014-08-06 MED ORDER — HEPARIN (PORCINE) IN NACL 100-0.45 UNIT/ML-% IJ SOLN
2000.0000 [IU]/h | INTRAMUSCULAR | Status: DC
  Administered 2014-08-07 (×2): 2000 [IU]/h via INTRAVENOUS
  Filled 2014-08-06: qty 250

## 2014-08-06 MED ORDER — ISOSORBIDE MONONITRATE ER 30 MG PO TB24
30.0000 mg | ORAL_TABLET | Freq: Every day | ORAL | Status: DC
  Administered 2014-08-06: 30 mg via ORAL
  Filled 2014-08-06: qty 1

## 2014-08-06 MED ORDER — ROSUVASTATIN CALCIUM 20 MG PO TABS
20.0000 mg | ORAL_TABLET | Freq: Every day | ORAL | Status: DC
  Administered 2014-08-06 – 2014-08-07 (×2): 20 mg via ORAL
  Filled 2014-08-06: qty 2
  Filled 2014-08-06: qty 1
  Filled 2014-08-06: qty 2

## 2014-08-06 MED ORDER — ASPIRIN 81 MG PO CHEW
81.0000 mg | CHEWABLE_TABLET | ORAL | Status: AC
  Administered 2014-08-07: 81 mg via ORAL
  Filled 2014-08-06: qty 1

## 2014-08-06 MED ORDER — ASPIRIN EC 81 MG PO TBEC
81.0000 mg | DELAYED_RELEASE_TABLET | Freq: Every day | ORAL | Status: DC
  Administered 2014-08-08: 81 mg via ORAL
  Filled 2014-08-06: qty 1

## 2014-08-06 MED ORDER — ASPIRIN 81 MG PO TABS: 81.0000 mg | ORAL_TABLET | Freq: Every day | ORAL | Status: DC

## 2014-08-06 MED ORDER — CLOPIDOGREL BISULFATE 75 MG PO TABS
75.0000 mg | ORAL_TABLET | Freq: Every day | ORAL | Status: DC
  Administered 2014-08-06 – 2014-08-08 (×3): 75 mg via ORAL
  Filled 2014-08-06 (×3): qty 1

## 2014-08-06 MED ORDER — INSULIN ASPART 100 UNIT/ML ~~LOC~~ SOLN
0.0000 [IU] | Freq: Three times a day (TID) | SUBCUTANEOUS | Status: DC
  Administered 2014-08-06: 3 [IU] via SUBCUTANEOUS
  Administered 2014-08-08: 07:00:00 1 [IU] via SUBCUTANEOUS

## 2014-08-06 MED ORDER — INSULIN GLARGINE 100 UNIT/ML SOLOSTAR PEN: 20.0000 [IU] | PEN_INJECTOR | Freq: Every day | SUBCUTANEOUS | Status: DC

## 2014-08-06 MED ORDER — CARVEDILOL 12.5 MG PO TABS: 12.5000 mg | ORAL_TABLET | Freq: Two times a day (BID) | ORAL | Status: DC

## 2014-08-06 MED ORDER — SODIUM CHLORIDE 0.9 % WEIGHT BASED INFUSION
1.0000 mL/kg/h | INTRAVENOUS | Status: DC
  Administered 2014-08-06 – 2014-08-07 (×2): 1 mL/kg/h via INTRAVENOUS

## 2014-08-06 MED ORDER — CARVEDILOL 3.125 MG PO TABS
6.2500 mg | ORAL_TABLET | Freq: Two times a day (BID) | ORAL | Status: DC
  Administered 2014-08-06 – 2014-08-08 (×5): 6.25 mg via ORAL
  Filled 2014-08-06: qty 1
  Filled 2014-08-06: qty 2
  Filled 2014-08-06: qty 1
  Filled 2014-08-06: qty 2
  Filled 2014-08-06: qty 1

## 2014-08-06 MED ORDER — ASPIRIN EC 81 MG PO TBEC
81.0000 mg | DELAYED_RELEASE_TABLET | Freq: Every day | ORAL | Status: DC
Start: 2014-08-06 — End: 2014-08-06
  Administered 2014-08-06: 81 mg via ORAL
  Filled 2014-08-06: qty 1

## 2014-08-06 MED ORDER — PREGABALIN 25 MG PO CAPS
75.0000 mg | ORAL_CAPSULE | Freq: Two times a day (BID) | ORAL | Status: DC
  Administered 2014-08-06 – 2014-08-08 (×5): 75 mg via ORAL
  Filled 2014-08-06 (×2): qty 1
  Filled 2014-08-06: qty 3
  Filled 2014-08-06 (×4): qty 1
  Filled 2014-08-06: qty 3

## 2014-08-06 MED ORDER — INSULIN GLARGINE 100 UNIT/ML ~~LOC~~ SOLN
10.0000 [IU] | Freq: Every day | SUBCUTANEOUS | Status: DC
  Administered 2014-08-06 – 2014-08-07 (×2): 10 [IU] via SUBCUTANEOUS
  Filled 2014-08-06 (×3): qty 0.1

## 2014-08-06 MED ORDER — ALPRAZOLAM 0.5 MG PO TABS
0.5000 mg | ORAL_TABLET | Freq: Every evening | ORAL | Status: DC | PRN
  Administered 2014-08-06 – 2014-08-08 (×2): 0.5 mg via ORAL
  Filled 2014-08-06 (×2): qty 1

## 2014-08-06 MED ORDER — SODIUM BICARBONATE 650 MG PO TABS
650.0000 mg | ORAL_TABLET | Freq: Two times a day (BID) | ORAL | Status: DC
Start: 2014-08-06 — End: 2014-08-08
  Administered 2014-08-06 – 2014-08-08 (×5): 650 mg via ORAL
  Filled 2014-08-06 (×7): qty 1

## 2014-08-06 MED ORDER — CALCITRIOL 0.25 MCG PO CAPS
0.2500 ug | ORAL_CAPSULE | ORAL | Status: DC
  Administered 2014-08-07: 0.25 ug via ORAL
  Filled 2014-08-06: qty 1

## 2014-08-06 MED ORDER — SODIUM POLYSTYRENE SULFONATE 15 GM/60ML PO SUSP: 15.0000 g | ORAL | Status: DC

## 2014-08-06 MED ORDER — ISOSORB DINITRATE-HYDRALAZINE 20-37.5 MG PO TABS
0.5000 | ORAL_TABLET | Freq: Two times a day (BID) | ORAL | Status: DC
  Filled 2014-08-06: qty 1

## 2014-08-06 MED ORDER — HYDRALAZINE HCL 25 MG PO TABS
50.0000 mg | ORAL_TABLET | Freq: Two times a day (BID) | ORAL | Status: DC
  Administered 2014-08-06 – 2014-08-07 (×3): 50 mg via ORAL
  Filled 2014-08-06 (×3): qty 1

## 2014-08-06 NOTE — Progress Notes (Signed)
Patient: CLEVESTER HELZER / Admit Date: 08/05/2014 / Date of Encounter: 08/06/2014, 10:16 AM   Subjective: I had notified Dr. Terrence Dupont of patient's admission but the patient and his daughter informed the pharmacist that they were in the process of switching to [facility] and wanted to see Dr. Ellyn Hack. They are aware that he is not rounding in the hospital today but want to see one of his colleagues. Wannetta Sender will notify Dr. Terrence Dupont who is in the cath lab.  They wish to follow-up with Dr. Ellyn Hack after discharge.  Patient denies any CP or SOB today.    Objective: Telemetry: NSR occasional PVCs Physical Exam: Blood pressure 168/72, pulse 63, temperature 97.9 F (36.6 C), temperature source Oral, resp. rate 17, height 5' 10.5" (1.791 m), weight 218 lb 9.6 oz (99.156 kg), SpO2 98 %. General: Well developed, well nourished WM in no acute distress. Head: Normocephalic, atraumatic, sclera non-icteric, no xanthomas, nares are without discharge. Neck: Negative for carotid bruits. JVP not elevated. Lungs: Clear bilaterally to auscultation without wheezes, rales, or rhonchi. Breathing is unlabored. Heart: RRR S1 S2 without murmurs, rubs, or gallops.  Abdomen: Soft, non-tender, non-distended with normoactive bowel sounds. No rebound/guarding. Extremities: No clubbing or cyanosis. No edema. Distal pedal pulses are 2+ and equal bilaterally. Neuro: Alert and oriented X 3. Moves all extremities spontaneously. Psych:  Responds to questions appropriately with a normal affect.   Intake/Output Summary (Last 24 hours) at 08/06/14 1016 Last data filed at 08/06/14 0800  Gross per 24 hour  Intake      0 ml  Output      0 ml  Net      0 ml    Inpatient Medications:  . carvedilol  6.25 mg Oral BID WC  . isosorbide-hydrALAZINE  0.5 tablet Oral BID  . rosuvastatin  20 mg Oral q1800   Infusions:  . heparin 1,500 Units/hr (08/06/14 0935)    Labs:  Recent Labs  08/05/14 2113 08/06/14 0447  NA 139 140  K 4.4  3.7  CL 105 109  CO2 24 24  GLUCOSE 280* 109*  BUN 43* 45*  CREATININE 3.83* 3.90*  CALCIUM 7.4* 7.6*    Recent Labs  08/06/14 0447  AST 23  ALT 13*  ALKPHOS 86  BILITOT 0.7  PROT 6.3*  ALBUMIN 2.8*    Recent Labs  08/05/14 2113  WBC 5.8  HGB 9.1*  HCT 27.2*  MCV 87.5  PLT 86*   Radiology/Studies:  Dg Chest 2 View  08/05/2014   CLINICAL DATA:  Chest pain and dyspnea for 1 week.  EXAM: CHEST  2 VIEW  COMPARISON:  11/30/2013  FINDINGS: There is mild unchanged cardiomegaly. There is prior sternotomy and CABG. The lungs are clear. The pulmonary vasculature is normal. There are no pleural effusions. Hilar and mediastinal contours are unremarkable and unchanged.  IMPRESSION: Mild cardiomegaly.  No acute cardiopulmonary findings.   Electronically Signed   By: Andreas Newport M.D.   On: 08/05/2014 21:32     Assessment and Plan  55M with CAD s/p CABG 2001 followed by POBA, CKD (previously on dialysis for several months in 2015), prostate CA, anemia, thrombocytopenia, LV dysfunction EF 40-45% by echo 08/2013 who presents with recurrent chest pain concerning for angina. Had cath 07/30/14 with plans to wait a week for renal recovery then attempt PCI of LM and Cx. Required HD for 2 sessions after angiography due to AKI on CKD. H/o thrombocytopenia including back to 2009 which has been previously worked  up with [facility] per daughter and is just being monitored at this point - identical brother has AML but he himself has never been diagnosed with this. Daughter also has low platelets. 2D echo shows EF 25-30% with multiple WMA, grade 2 DD, severely dilated LA, mildly dilated RV with moderately reduced systolic function, mildly dilated RA, mod increased PASP.  1. CAD with chest pain concerning for NSTEMI 2. AKI on CKD, previously requiring HD (d/c Cr 3.03; up to 3.9 this admission) 3. Chronic systolic CHF with interim worsening of EF - 25-30% with multiple WMA 4. Chronic thrombocytopenia 5.  Worsening of chronic anemia without reported bleeding  Tricky situation with renal function, and the patient is aware of that. Dr. Martinique has reviewed films and does agree patient would benefit from PCI. He was loaded with Plavix last night thus will re-order both ASA and Plavix for this AM. BP running high but need to restart home meds. I clarified he was taking Coreg 6.25mg  BID at home, not 12.5. May titrate if needed. No ACEI/ARB/spiro due to renal dysfunction. Will allow to eat today since he is chest pain free and will ask renal to see. Hgb down somewhat but he denies any bleeding - also has chronic thrombocytopenia which we will follow. Will make NPO after midnight in case we are cleared to proceed with cath. Tentatively decrease Lantus to 10 units QHS since he will need to be NPO after midnight. Add SSI.  Signed, Melina Copa PA-C Pager: [phone]   Patient seen and examined and history reviewed. Agree with above findings and plan. I reviewed cardiac cath films from [place]. 69 yo WM with prior CABG and known occlusion of all SVGs. Patent LIMA to LAD. RCA chronically occluded with collaterals. High grade distal left main stenosis extending into the ostial LCx. Some calcification. Angulated segment. By Echo EF is severely reduced with inferior akinesis. EF 25-30%. Now with NSTEMI and progressive angina. I agree that PCI of the distal left main/ ostial LCx is reasonable. Now on ASA and Plavix. He has advanced CKD and is at increased risk of contrast induced nephropathy. Renal consulted. Will hydrate overnight and plan PCI tomorrow with Dr. Burt Knack. Extensive discussion with patient and daughter and they agree to proceed. He does have chronic thrombocytopenia with prior hematology evaluation. No bleeding history. I would favor a DES in this situation since the location of lesion has a high risk of restenosis.   Lucina Betty Martinique, [place] 08/06/2014 12:25 PM

## 2014-08-06 NOTE — Progress Notes (Signed)
CRITICAL VALUE ALERT  Critical value received:  Tropin 2.08  Date of notification:  08/06/14  Time of notification:  1610  Critical value read back:Yes.    Nurse who received alert:  Quincy Sheehan  MD notified (1st page):  Martinique   Time of first page:  1125  MD notified (2nd page):  Time of second page:  Responding MD:  PA Dunn on unit and notified   Time MD responded:  1122

## 2014-08-06 NOTE — Consult Note (Signed)
Joseph [name] is a 69 y.o. male with a known history of hypertension, hyperlipidemia chronic renal failure, coronary artery disease, cardiomyopathy with systolic dysfunction who in the past was on dialysis for  uncertain reasons from March 2015 to October 2015.   He was taken off dialysis on Dec 06, 2013. He subsequently received prophylactic hemodialysis for a contrast procedure a few months ago.  Patient has been having chest pain and shortness of breath therefore Dr. Humphrey Rolls did a cardiac catheterization on 6/23 and he received prophylactic hemodialysis by Dr Holley Raring on 6/23/ and 6/24 [facility]  Cath revealed a high grade lesion of the left main extending to the circumflex and [facility]  Creat today is 3.8101mdl.  He has never had a renal biopsy.    Past Medical History  Diagnosis Date  . Hypertension   . Hypercholesterolemia   . Prostate cancer   . Renal disorder     Dr. KoJuleen China. Enlarged liver   . Spleen enlarged   . Thrombocytopenia   . Myocardial infarction   . Anginal pain   . Anxiety   . Depression   . Shortness of breath   . Diabetes mellitus without complication     INSULIN DEPENDENT  . GERD (gastroesophageal reflux disease)   . History of shingles   . CHF (congestive heart failure)   . Dialysis patient     currently on hold- Nov 2015   Past Surgical History  Procedure Laterality Date  . Cardiac surgery    . Shoulder arthroscopy    . Penile prosthesis implant    . Myocardial infarction    . Cardiac catheterization    . Coronary angioplasty    . Coronary artery bypass graft  2001  . Colonoscopy    . Liver biopsy    . Av fistula placement Left 2015  . Cardiac catheterization N/A 07/30/2014    Procedure: Left Heart Cath and Cors/Grafts Angiography;  Surgeon: ShDionisio DavidMD;  [facility];  Service: Cardiovascular;  Laterality: N/A;   Social History:  reports that he quit  smoking about 6 years ago. He has quit using smokeless tobacco. He reports that he does not drink alcohol or use illicit drugs. Allergies:  Allergies  Allergen Reactions  . Sulfa Antibiotics Itching    hives   Family History  Problem Relation Age of Onset  . Acute myelogenous leukemia Brother   . CAD Brother   . Diabetes Mellitus II Brother     Medications:  Scheduled: . aspirin EC  81 mg Oral Daily  . [START ON 08/07/2014] calcitRIOL  0.25 mcg Oral Once per day on Mon Wed Fri  . carvedilol  6.25 mg Oral BID WC  . clopidogrel  75 mg Oral Daily  . hydrALAZINE  50 mg Oral BID  . insulin aspart  0-9 Units Subcutaneous TID WC  . insulin glargine  10 Units Subcutaneous QHS  . isosorbide mononitrate  30 mg Oral Daily  . pregabalin  75 mg Oral BID  . ranolazine  500 mg Oral BID  . rosuvastatin  20 mg Oral q1800  . sodium bicarbonate  650 mg Oral BID  . [START ON 08/07/2014] sodium polystyrene  15 g Oral Once per day on Mon Wed Fri   Continuous: . heparin 1,500 Units/hr (08/06/14 0935)     ROS: as per HPI  Blood pressure 168/72, pulse 63,  temperature 97.9 F (36.6 C), temperature source Oral, resp. rate 17, height 5' 10.5" (1.791 m), weight 99.156 kg (218 lb 9.6 oz), SpO2 98 %.  General appearance: alert and cooperative Head: Normocephalic, without obvious abnormality, atraumatic Eyes: negative Ears: normal TM's and external ear canals both ears Nose: Nares normal. Septum midline. Mucosa normal. No drainage or sinus tenderness. Throat: lips, mucosa, and tongue normal; teeth and gums normal Resp: clear to auscultation bilaterally Chest wall: no tenderness Cardio: regular rate and rhythm, S1, S2 normal, no murmur, click, rub or gallop GI: soft, non-tender; bowel sounds normal; no masses,  no organomegaly Extremities: extremities normal, atraumatic, no cyanosis or edema and LUE AVF well arterialized Skin: Skin color, texture, turgor normal. No rashes or lesions Neurologic: Grossly  normal Results for orders placed or performed during the hospital encounter of 08/05/14 (from the past 48 hour(s))  CBC     Status: Abnormal   Collection Time: 08/05/14  9:13 PM  Result Value Ref Range   WBC 5.8 4.0 - 10.5 K/uL   RBC 3.11 (L) 4.22 - 5.81 MIL/uL   Hemoglobin 9.1 (L) 13.0 - 17.0 g/dL   HCT 27.2 (L) 39.0 - 52.0 %   MCV 87.5 78.0 - 100.0 fL   MCH 29.3 26.0 - 34.0 pg   MCHC 33.5 30.0 - 36.0 g/dL   RDW 15.4 11.5 - 15.5 %   Platelets 86 (L) 150 - 400 K/uL    Comment: SPECIMEN CHECKED FOR CLOTS REPEATED TO VERIFY PLATELET COUNT CONFIRMED BY SMEAR   Basic metabolic panel     Status: Abnormal   Collection Time: 08/05/14  9:13 PM  Result Value Ref Range   Sodium 139 135 - 145 mmol/L   Potassium 4.4 3.5 - 5.1 mmol/L   Chloride 105 101 - 111 mmol/L   CO2 24 22 - 32 mmol/L   Glucose, Bld 280 (H) 65 - 99 mg/dL   BUN 43 (H) 6 - 20 mg/dL   Creatinine, Ser 3.83 (H) 0.61 - 1.24 mg/dL   Calcium 7.4 (L) 8.9 - 10.3 mg/dL   GFR calc non Af Amer 15 (L) >60 mL/min   GFR calc Af Amer 17 (L) >60 mL/min    Comment: (NOTE) The eGFR has been calculated using the CKD EPI equation. This calculation has not been validated in all clinical situations. eGFR's persistently <60 mL/min signify possible Chronic Kidney Disease.    Anion gap 10 5 - 15  Brain natriuretic peptide     Status: Abnormal   Collection Time: 08/05/14  9:13 PM  Result Value Ref Range   B Natriuretic Peptide 1301.3 (H) 0.0 - 100.0 pg/mL  I-stat troponin, ED  (not at [facility], [facility])     Status: Abnormal   Collection Time: 08/05/14  9:22 PM  Result Value Ref Range   Troponin i, poc 0.10 (HH) 0.00 - 0.08 ng/mL   Comment 3            Comment: Due to the release kinetics of cTnI, a negative result within the first hours of the onset of symptoms does not rule out myocardial infarction with certainty. If myocardial infarction is still suspected, repeat the test at appropriate intervals.   Comprehensive metabolic panel      Status: Abnormal   Collection Time: 08/06/14  4:47 AM  Result Value Ref Range   Sodium 140 135 - 145 mmol/L   Potassium 3.7 3.5 - 5.1 mmol/L   Chloride 109 101 - 111 mmol/L   CO2  24 22 - 32 mmol/L   Glucose, Bld 109 (H) 65 - 99 mg/dL   BUN 45 (H) 6 - 20 mg/dL   Creatinine, Ser 3.90 (H) 0.61 - 1.24 mg/dL   Calcium 7.6 (L) 8.9 - 10.3 mg/dL   Total Protein 6.3 (L) 6.5 - 8.1 g/dL   Albumin 2.8 (L) 3.5 - 5.0 g/dL   AST 23 15 - 41 U/L   ALT 13 (L) 17 - 63 U/L   Alkaline Phosphatase 86 38 - 126 U/L   Total Bilirubin 0.7 0.3 - 1.2 mg/dL   GFR calc non Af Amer 15 (L) >60 mL/min   GFR calc Af Amer 17 (L) >60 mL/min    Comment: (NOTE) The eGFR has been calculated using the CKD EPI equation. This calculation has not been validated in all clinical situations. eGFR's persistently <60 mL/min signify possible Chronic Kidney Disease.    Anion gap 7 5 - 15  Troponin I (q 6hr x 3)     Status: Abnormal   Collection Time: 08/06/14  4:47 AM  Result Value Ref Range   Troponin I 2.08 (HH) <0.031 ng/mL    Comment:        POSSIBLE MYOCARDIAL ISCHEMIA. SERIAL TESTING RECOMMENDED. CRITICAL RESULT CALLED TO, READ BACK BY AND VERIFIED WITH: AYERS T RN 08/06/14 1122 COSTELLO B REPEATED TO VERIFY   Heparin level (unfractionated)     Status: Abnormal   Collection Time: 08/06/14  7:59 AM  Result Value Ref Range   Heparin Unfractionated 0.25 (L) 0.30 - 0.70 IU/mL    Comment:        IF HEPARIN RESULTS ARE BELOW EXPECTED VALUES, AND PATIENT DOSAGE HAS BEEN CONFIRMED, SUGGEST FOLLOW UP TESTING OF ANTITHROMBIN III LEVELS.    Dg Chest 2 View  08/05/2014   CLINICAL DATA:  Chest pain and dyspnea for 1 week.  EXAM: CHEST  2 VIEW  COMPARISON:  11/30/2013  FINDINGS: There is mild unchanged cardiomegaly. There is prior sternotomy and CABG. The lungs are clear. The pulmonary vasculature is normal. There are no pleural effusions. Hilar and mediastinal contours are unremarkable and unchanged.  IMPRESSION: Mild  cardiomegaly.  No acute cardiopulmonary findings.   Electronically Signed   By: Andreas Newport M.D.   On: 08/05/2014 21:32    Assessment:  1 Stage 4 CKD of uncertain etiology, prior hemodialysis 2 ASCVD/CAD 3 Left Arm AVF 4 Moderate to high risk for CIN  Plan: 1 IVFs as tolerated pre cath per cardiology 2 Minimize contrast dye and do a staged intervention if needed 3 No role for prophylactic hemodialysis and it was explained to pt and daughter that we do not do this 4 Urine analysis, SPEP, ANA, ANCA  Sana Tessmer C 08/06/2014, 12:07 PM

## 2014-08-06 NOTE — Progress Notes (Signed)
  Echocardiogram 2D Echocardiogram has been performed.  Joseph [name] 08/06/2014, 8:36 AM

## 2014-08-06 NOTE — H&P (Signed)
Cardiologist: Dr. Humphrey Rolls, Joseph [name]  Cc: CP  HPI: 69 yo man with known CAD, CABG in 2001 followed by angioplasty (POBA), CKD, prostate cancer, who recently had coronary and bypass angiography via R femoral approach by Dr. Humphrey Rolls about a week ago presents to ER for worsening angina now even at rest. Associated with SOB. No syncope, diaphoresis, N/V, cough, fever, bleeding, orthopnea, PND. At the time of my evaluation, he is CP free.     Recent angiography reported:  100% Prox RCA lesion, Ost LM to LM lesion 70% stenosed, Ost Cx to Prox Cx lesion 70% stenosed, all vein grafts occluded but LIMA to LAD patent, overall unchanged from cath 2009. The plan was made to wait for about a week fo renal function to improve before attempting PCI of LM and Cx.   Apparently, he had required hemodialysis for 2 session after angiography due to AKI on CKD. He also has thrombocytopenia with platelet count in 70's but no bleeding issues.   His last echo in 08/2013 reported 40-45% LVEF.    Review of Systems:  10 systems reviewed unremarkable except as noted in HPI    Past Medical History  Diagnosis Date  . Hypertension   . Hypercholesterolemia   . Prostate cancer   . Renal disorder     Dr. Juleen China  . Enlarged liver   . Spleen enlarged   . Thrombocytopenia   . Myocardial infarction   . Anginal pain   . Anxiety   . Depression   . Shortness of breath   . Diabetes mellitus without complication     INSULIN DEPENDENT  . GERD (gastroesophageal reflux disease)   . History of shingles   . CHF (congestive heart failure)   . Dialysis patient     currently on hold- Nov 2015   No current facility-administered medications on file prior to encounter.   Current Outpatient Prescriptions on File Prior to Encounter  Medication Sig Dispense Refill  . aspirin 81 MG tablet Take 81 mg by mouth daily.    . calcitRIOL (ROCALTROL) 0.25 MCG capsule Take 0.25 mcg by mouth 3 (three) times a week. On Monday, Wednesday,  and Friday    . carvedilol (COREG) 12.5 MG tablet Take 1 tablet (12.5 mg total) by mouth 2 (two) times daily with a meal. 60 tablet 0  . CRESTOR 10 MG tablet Take 10 mg by mouth 2 (two) times daily.     . hydrALAZINE (APRESOLINE) 50 MG tablet Take 50 mg by mouth 2 (two) times daily.     . isosorbide mononitrate (IMDUR) 30 MG 24 hr tablet Take 30 mg by mouth daily.    [name] LANTUS SOLOSTAR 100 UNIT/ML Solostar Pen Inject 20 Units into the skin at bedtime.     [name] LYRICA 75 MG capsule Take 75 mg by mouth 2 (two) times daily.   0  . ranolazine (RANEXA) 500 MG 12 hr tablet Take 1 tablet (500 mg total) by mouth 2 (two) times daily. 60 tablet 1  . sodium bicarbonate 650 MG tablet Take 650 mg by mouth 2 (two) times daily.     . sodium polystyrene (KAYEXALATE) 15 GM/60ML suspension Take 15 g by mouth 3 (three) times a week. Mon / Wed / Fri    . torsemide (DEMADEX) 20 MG tablet Take 40 mg by mouth daily as needed (edema).     . Vitamin D, Ergocalciferol, (DRISDOL) 50000 UNITS CAPS capsule Take 50,000 Units by mouth every 30 (thirty) days.    [name]  ALPRAZolam (XANAX) 0.5 MG tablet Take 0.5 mg by mouth at bedtime as needed for anxiety or sleep.         Allergies  Allergen Reactions  . Sulfa Antibiotics Itching    hives    History   Social History  . Marital Status: Legally Separated    Spouse Name: N/A  . Number of Children: N/A  . Years of Education: N/A   Occupational History  . Not on file.   Social History Main Topics  . Smoking status: Former Smoker    Quit date: 02/07/2008  . Smokeless tobacco: Former Systems developer  . Alcohol Use: No  . Drug Use: No  . Sexual Activity: Not on file   Other Topics Concern  . Not on file   Social History Narrative    Family History  Problem Relation Age of Onset  . Acute myelogenous leukemia Brother   . CAD Brother   . Diabetes Mellitus II Brother     PHYSICAL EXAM: Filed Vitals:   08/06/14 0022  BP: 163/70  Pulse: 63  Temp: 98.1 F (36.7 C)  Resp:  18   General:  Well appearing. No respiratory difficulty HEENT: normal Neck: supple. no JVD. Carotids 2+ bilat; no bruits. No lymphadenopathy or thryomegaly appreciated. Cor: PMI nondisplaced. Regular rate & rhythm. No rubs, gallops or murmurs. Lungs: clear Abdomen: soft, nontender, nondistended. No hepatosplenomegaly. No bruits or masses. Good bowel sounds. Extremities: no cyanosis, clubbing, rash, edema Neuro: alert & oriented x 3, cranial nerves grossly intact. moves all 4 extremities w/o difficulty. Affect pleasant.  ECG: NSR, PAC, lateral T inv , normal AV conduction- reviewed by me   Results for orders placed or performed during the hospital encounter of 08/05/14 (from the past 24 hour(s))  CBC     Status: Abnormal   Collection Time: 08/05/14  9:13 PM  Result Value Ref Range   WBC 5.8 4.0 - 10.5 K/uL   RBC 3.11 (L) 4.22 - 5.81 MIL/uL   Hemoglobin 9.1 (L) 13.0 - 17.0 g/dL   HCT 27.2 (L) 39.0 - 52.0 %   MCV 87.5 78.0 - 100.0 fL   MCH 29.3 26.0 - 34.0 pg   MCHC 33.5 30.0 - 36.0 g/dL   RDW 15.4 11.5 - 15.5 %   Platelets 86 (L) 150 - 400 K/uL  Basic metabolic panel     Status: Abnormal   Collection Time: 08/05/14  9:13 PM  Result Value Ref Range   Sodium 139 135 - 145 mmol/L   Potassium 4.4 3.5 - 5.1 mmol/L   Chloride 105 101 - 111 mmol/L   CO2 24 22 - 32 mmol/L   Glucose, Bld 280 (H) 65 - 99 mg/dL   BUN 43 (H) 6 - 20 mg/dL   Creatinine, Ser 3.83 (H) 0.61 - 1.24 mg/dL   Calcium 7.4 (L) 8.9 - 10.3 mg/dL   GFR calc non Af Amer 15 (L) >60 mL/min   GFR calc Af Amer 17 (L) >60 mL/min   Anion gap 10 5 - 15  Brain natriuretic peptide     Status: Abnormal   Collection Time: 08/05/14  9:13 PM  Result Value Ref Range   B Natriuretic Peptide 1301.3 (H) 0.0 - 100.0 pg/mL  I-stat troponin, ED  (not at [facility], [facility])     Status: Abnormal   Collection Time: 08/05/14  9:22 PM  Result Value Ref Range   Troponin i, poc 0.10 (HH) 0.00 - 0.08 ng/mL   Comment 3  Dg Chest 2  View  08/05/2014   CLINICAL DATA:  Chest pain and dyspnea for 1 week.  EXAM: CHEST  2 VIEW  COMPARISON:  11/30/2013  FINDINGS: There is mild unchanged cardiomegaly. There is prior sternotomy and CABG. The lungs are clear. The pulmonary vasculature is normal. There are no pleural effusions. Hilar and mediastinal contours are unremarkable and unchanged.  IMPRESSION: Mild cardiomegaly.  No acute cardiopulmonary findings.   Electronically Signed   By: Andreas Newport M.D.   On: 08/05/2014 21:32     ASSESSMENT:  1. NSTE-ACS in the setting of significant CAD of native vessels (including LM and Cx) with occluded vein grafts but patent LIMA-LAD - stable BP and HR - No signs of cardiogenic shock or unstable arrhythmias or decompensated HF  2. T2DM, IDDM   PLAN/DISCUSSION:  Will treat him with GDMTwith ASA, Plavix and Heparin. Will have to monitor platelets.  Continue high potency statin and beta blocker Keep NPO for possible PCI  Wandra Mannan, MD Cardiology

## 2014-08-06 NOTE — Progress Notes (Signed)
[place] for Heparin Indication: chest pain/ACS  Allergies  Allergen Reactions  . Sulfa Antibiotics Itching    hives    Patient Measurements: Height: 5' 10.5" (179.1 cm) Weight: 218 lb 9.6 oz (99.156 kg) IBW/kg (Calculated) : 74.15  Ht: 70 in  Wt: 102 kg  IBW: 73 kg Heparin Dosing Weight: 94 kg  Vital Signs: Temp: 97.6 F (36.4 C) (06/30 1350) Temp Source: Oral (06/30 1350) BP: 161/76 mmHg (06/30 1721) Pulse Rate: 57 (06/30 1721)  Labs:  Recent Labs  08/05/14 2113 08/06/14 0447 08/06/14 0759 08/06/14 1355 08/06/14 1718 08/06/14 1719  HGB 9.1*  --   --   --   --   --   HCT 27.2*  --   --   --   --   --   PLT 86*  --   --   --   --   --   HEPARINUNFRC  --   --  0.25*  --   --  0.19*  CREATININE 3.83* 3.90*  --   --   --   --   TROPONINI  --  2.08*  --  1.80* 0.08*  --     Estimated Creatinine Clearance: 22.2 mL/min (by C-G formula based on Cr of 3.9).   Medical History: Past Medical History  Diagnosis Date  . Hypertension   . Hypercholesterolemia   . Prostate cancer   . Renal disorder     Dr. Juleen China  . Enlarged liver   . Spleen enlarged   . Thrombocytopenia   . Myocardial infarction   . Anginal pain   . Anxiety   . Depression   . Shortness of breath   . Diabetes mellitus without complication     INSULIN DEPENDENT  . GERD (gastroesophageal reflux disease)   . History of shingles   . CHF (congestive heart failure)   . Dialysis patient     currently on hold- Nov 2015    Assessment: 69 y.o. male presents with CP. To begin heparin for r/o ACS.   Heparin level low this AM, and rate was increased.  Repeat heparin level remains low, RN confirmed rate was increased as ordered and no obvious issues with IV infusion.  Goal of Therapy:  Heparin level 0.3-0.7 units/ml Monitor platelets by anticoagulation protocol: Yes   Plan:  Increase IV heparin to 1750 units/hr Will f/u 8 hr heparin level. Daily heparin level  and CBC F/u p cath 7/1.  Uvaldo Rising, BCPS  Clinical Pharmacist Pager [phone]  08/06/2014 6:45 PM

## 2014-08-06 NOTE — Progress Notes (Signed)
Per review of recent cath, progress notes and discharge summary, the patient's case was discussed between Dr. Humphrey Rolls at [facility] and Dr. Terrence Dupont here at [facility] with plans for Dr. Terrence Dupont to see and do PCI. Will notify Dr. Terrence Dupont to follow patient on his service. Caliyah Sieh PA-C

## 2014-08-06 NOTE — Progress Notes (Signed)
[place] for Heparin Indication: chest pain/ACS  Allergies  Allergen Reactions  . Sulfa Antibiotics Itching    hives    Patient Measurements: Height: 5' 10.5" (179.1 cm) Weight: 218 lb 9.6 oz (99.156 kg) IBW/kg (Calculated) : 74.15  Ht: 70 in  Wt: 102 kg  IBW: 73 kg Heparin Dosing Weight: 94 kg  Vital Signs: Temp: 97.9 F (36.6 C) (06/30 0500) BP: 168/72 mmHg (06/30 0902) Pulse Rate: 63 (06/30 0902)  Labs:  Recent Labs  08/05/14 2113 08/06/14 0447 08/06/14 0759  HGB 9.1*  --   --   HCT 27.2*  --   --   PLT 86*  --   --   HEPARINUNFRC  --   --  0.25*  CREATININE 3.83* 3.90*  --     Estimated Creatinine Clearance: 22.2 mL/min (by C-G formula based on Cr of 3.9).   Medical History: Past Medical History  Diagnosis Date  . Hypertension   . Hypercholesterolemia   . Prostate cancer   . Renal disorder     Dr. Juleen China  . Enlarged liver   . Spleen enlarged   . Thrombocytopenia   . Myocardial infarction   . Anginal pain   . Anxiety   . Depression   . Shortness of breath   . Diabetes mellitus without complication     INSULIN DEPENDENT  . GERD (gastroesophageal reflux disease)   . History of shingles   . CHF (congestive heart failure)   . Dialysis patient     currently on hold- Nov 2015    Medications:  See electronic med rec  Assessment: 69 y.o. male presents with CP. To begin heparin for r/o ACS.   Initial heparin level just below goal, no bleeding complications noted per nursing during am rounds. Appears that plan is for cath/pci this admission.  Goal of Therapy:  Heparin level 0.3-0.7 units/ml Monitor platelets by anticoagulation protocol: Yes   Plan:  Increase IV heparin to 1500 units/hr Will f/u 8 hr heparin level or after cath Daily heparin level and CBC  Erin Hearing PharmD., BCPS Clinical Pharmacist Pager [phone] 08/06/2014 9:35 AM

## 2014-08-06 NOTE — Progress Notes (Signed)
Utilization review completed. Adrain Nesbit, RN, BSN. 

## 2014-08-07 ENCOUNTER — Encounter (HOSPITAL_COMMUNITY)
Admission: EM | Disposition: A | Discharge status: Home / Self Care | Payer: Medicare Other | Source: Home / Self Care | Attending: Cardiology

## 2014-08-07 DIAGNOSIS — I2510 Atherosclerotic heart disease of native coronary artery without angina pectoris: Secondary | ICD-10-CM

## 2014-08-07 HISTORY — PX: CARDIAC CATHETERIZATION: SHX172

## 2014-08-07 LAB — BASIC METABOLIC PANEL
Anion gap: 8 (ref 5–15)
BUN: 42 mg/dL — AB (ref 6–20)
CO2: 24 mmol/L (ref 22–32)
CREATININE: 3.49 mg/dL — AB (ref 0.61–1.24)
Calcium: 7.3 mg/dL — ABNORMAL LOW (ref 8.9–10.3)
Chloride: 109 mmol/L (ref 101–111)
GFR calc Af Amer: 20 mL/min — ABNORMAL LOW (ref >60)
GFR, EST NON AFRICAN AMERICAN: 17 mL/min — AB (ref >60)
GLUCOSE: 130 mg/dL — AB (ref 65–99)
Potassium: 3.6 mmol/L (ref 3.5–5.1)
SODIUM: 141 mmol/L (ref 135–145)

## 2014-08-07 LAB — HEPARIN LEVEL (UNFRACTIONATED)
HEPARIN UNFRACTIONATED: 0.18 [IU]/mL — AB (ref 0.30–0.70)
Heparin Unfractionated: 0.52 IU/mL (ref 0.30–0.70)

## 2014-08-07 LAB — CBC
HEMATOCRIT: 27.8 % — AB (ref 39.0–52.0)
Hemoglobin: 9.2 g/dL — ABNORMAL LOW (ref 13.0–17.0)
MCH: 28.3 pg (ref 26.0–34.0)
MCHC: 33.1 g/dL (ref 30.0–36.0)
MCV: 85.5 fL (ref 78.0–100.0)
PLATELETS: 71 10*3/uL — AB (ref 150–400)
RBC: 3.25 MIL/uL — ABNORMAL LOW (ref 4.22–5.81)
RDW: 15.2 % (ref 11.5–15.5)
WBC: 4.1 10*3/uL (ref 4.0–10.5)

## 2014-08-07 LAB — GLUCOSE, CAPILLARY
GLUCOSE-CAPILLARY: 188 mg/dL — AB (ref 65–99)
GLUCOSE-CAPILLARY: 117 mg/dL — AB (ref 65–99)
GLUCOSE-CAPILLARY: 77 mg/dL (ref 65–99)
GLUCOSE-CAPILLARY: 71 mg/dL (ref 65–99)
Glucose-Capillary: 113 mg/dL — ABNORMAL HIGH (ref 65–99)

## 2014-08-07 LAB — POCT ACTIVATED CLOTTING TIME
Activated Clotting Time: 417 seconds
Activated Clotting Time: 178 seconds
Activated Clotting Time: 153 seconds

## 2014-08-07 SURGERY — CORONARY STENT INTERVENTION
Anesthesia: LOCAL

## 2014-08-07 MED ORDER — FENTANYL CITRATE (PF) 100 MCG/2ML IJ SOLN
INTRAMUSCULAR | Status: DC | PRN
  Administered 2014-08-07: 25 ug via INTRAVENOUS

## 2014-08-07 MED ORDER — HEPARIN SODIUM (PORCINE) 1000 UNIT/ML IJ SOLN
INTRAMUSCULAR | Status: AC
  Filled 2014-08-07: qty 1

## 2014-08-07 MED ORDER — IOHEXOL 350 MG/ML SOLN
INTRAVENOUS | Status: DC | PRN
  Administered 2014-08-07: 65 mL via INTRACARDIAC

## 2014-08-07 MED ORDER — SODIUM CHLORIDE 0.9 % IJ SOLN: 3.0000 mL | Freq: Two times a day (BID) | INTRAMUSCULAR | Status: DC

## 2014-08-07 MED ORDER — ONDANSETRON HCL 4 MG/2ML IJ SOLN: 4.0000 mg | Freq: Four times a day (QID) | INTRAMUSCULAR | Status: DC | PRN

## 2014-08-07 MED ORDER — ISOSORBIDE MONONITRATE ER 60 MG PO TB24
60.0000 mg | ORAL_TABLET | Freq: Every day | ORAL | Status: DC
  Administered 2014-08-07 – 2014-08-08 (×2): 60 mg via ORAL
  Filled 2014-08-07 (×2): qty 1

## 2014-08-07 MED ORDER — BIVALIRUDIN 250 MG IV SOLR
INTRAVENOUS | Status: AC
  Filled 2014-08-07: qty 250

## 2014-08-07 MED ORDER — SODIUM CHLORIDE 0.9 % IV SOLN
250.0000 mg | INTRAVENOUS | Status: DC | PRN
  Administered 2014-08-07: 1 mg/kg/h via INTRAVENOUS

## 2014-08-07 MED ORDER — LIDOCAINE HCL (PF) 1 % IJ SOLN
INTRAMUSCULAR | Status: AC
  Filled 2014-08-07: qty 30

## 2014-08-07 MED ORDER — SODIUM CHLORIDE 0.9 % IV SOLN: 250.0000 mL | INTRAVENOUS | Status: DC | PRN

## 2014-08-07 MED ORDER — HEPARIN (PORCINE) IN NACL 2-0.9 UNIT/ML-% IJ SOLN
INTRAMUSCULAR | Status: AC
  Filled 2014-08-07: qty 1000

## 2014-08-07 MED ORDER — CLOPIDOGREL BISULFATE 75 MG PO TABS: 75.0000 mg | ORAL_TABLET | Freq: Every day | ORAL | Status: DC

## 2014-08-07 MED ORDER — VERAPAMIL HCL 2.5 MG/ML IV SOLN
INTRAVENOUS | Status: AC
  Filled 2014-08-07: qty 2

## 2014-08-07 MED ORDER — MIDAZOLAM HCL 2 MG/2ML IJ SOLN
INTRAMUSCULAR | Status: DC | PRN
Start: 2014-08-07 — End: 2014-08-07
  Administered 2014-08-07: 1 mg via INTRAVENOUS

## 2014-08-07 MED ORDER — ALPRAZOLAM 0.25 MG PO TABS
0.2500 mg | ORAL_TABLET | Freq: Once | ORAL | Status: AC
  Administered 2014-08-07: 0.25 mg via ORAL
  Filled 2014-08-07: qty 1

## 2014-08-07 MED ORDER — MORPHINE SULFATE 2 MG/ML IJ SOLN
2.0000 mg | INTRAMUSCULAR | Status: DC | PRN
  Filled 2014-08-07: qty 1

## 2014-08-07 MED ORDER — MIDAZOLAM HCL 2 MG/2ML IJ SOLN
INTRAMUSCULAR | Status: AC
  Filled 2014-08-07: qty 2

## 2014-08-07 MED ORDER — BIVALIRUDIN BOLUS VIA INFUSION - CUPID
INTRAVENOUS | Status: DC | PRN
  Administered 2014-08-07: 74.4 mg via INTRAVENOUS

## 2014-08-07 MED ORDER — SODIUM CHLORIDE 0.9 % IV SOLN
250.0000 mL | INTRAVENOUS | Status: DC | PRN
Start: 2014-08-07 — End: 2014-08-07

## 2014-08-07 MED ORDER — LIDOCAINE HCL (PF) 1 % IJ SOLN
INTRAMUSCULAR | Status: DC | PRN
  Administered 2014-08-07: 5 mL

## 2014-08-07 MED ORDER — NITROGLYCERIN 1 MG/10 ML FOR IR/CATH LAB
INTRA_ARTERIAL | Status: AC
  Filled 2014-08-07: qty 10

## 2014-08-07 MED ORDER — SODIUM CHLORIDE 0.9 % IJ SOLN: 3.0000 mL | INTRAMUSCULAR | Status: DC | PRN

## 2014-08-07 MED ORDER — FENTANYL CITRATE (PF) 100 MCG/2ML IJ SOLN
INTRAMUSCULAR | Status: AC
  Filled 2014-08-07: qty 2

## 2014-08-07 MED ORDER — HYDRALAZINE HCL 20 MG/ML IJ SOLN
5.0000 mg | Freq: Once | INTRAMUSCULAR | Status: AC | PRN
  Administered 2014-08-07: 5 mg via INTRAVENOUS
  Filled 2014-08-07: qty 1

## 2014-08-07 MED ORDER — ACETAMINOPHEN 325 MG PO TABS
650.0000 mg | ORAL_TABLET | ORAL | Status: DC | PRN
  Administered 2014-08-08: 07:00:00 650 mg via ORAL
  Filled 2014-08-07: qty 2

## 2014-08-07 MED ORDER — ASPIRIN 81 MG PO CHEW: 81.0000 mg | CHEWABLE_TABLET | Freq: Every day | ORAL | Status: DC

## 2014-08-07 SURGICAL SUPPLY — 12 items
BALLN EUPHORA RX 2.0X12 (BALLOONS) ×2
BALLOON EUPHORA RX 2.0X12 (BALLOONS) ×1 IMPLANT
CATH VISTA GUIDE 6FR XB4 (CATHETERS) ×2 IMPLANT
KIT ENCORE 26 ADVANTAGE (KITS) ×2 IMPLANT
KIT HEART LEFT (KITS) ×2 IMPLANT
PACK CARDIAC CATHETERIZATION (CUSTOM PROCEDURE TRAY) ×2 IMPLANT
SHEATH PINNACLE 6F 10CM (SHEATH) ×2 IMPLANT
STENT SYNERGY DES 2.25X8 (Permanent Stent) ×2 IMPLANT
TRANSDUCER W/STOPCOCK (MISCELLANEOUS) ×2 IMPLANT
TUBING CIL FLEX 10 FLL-RA (TUBING) ×2 IMPLANT
WIRE ASAHI PROWATER 180CM (WIRE) ×2 IMPLANT
WIRE EMERALD 3MM-J .035X150CM (WIRE) ×2 IMPLANT

## 2014-08-07 NOTE — Progress Notes (Signed)
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 2016, thrombocytopenia( past eval by Hematology- including BM- elsewhere according to family)  Plan: 1 IVFs as tolerated pre cath per cardiology 2 Minimize contrast dye and do a staged intervention if needed 3 No role for prophylactic hemodialysis  4 SPEP, ANA, & ANCA pending  Subjective: Interval History: Feels ok  Objective: Vital signs in last 24 hours: Temp:  [97.6 F (36.4 C)-97.8 F (36.6 C)] 97.8 F (36.6 C) (07/01 2703) Pulse Rate:  [57-64] 64 (07/01 0837) Resp:  [18] 18 (06/30 1350) BP: (153-174)/(63-83) 174/83 mmHg (07/01 0837) SpO2:  [96 %-99 %] 96 % (07/01 0633) Weight:  [99.2 kg (218 lb 11.1 oz)] 99.2 kg (218 lb 11.1 oz) (07/01 0837) Weight change:   Intake/Output from previous day: 06/30 0701 - 07/01 0700 In: 1200.6 [P.O.:240; I.V.:960.6] Out: -  Intake/Output this shift: Total I/O In: 857.6 [I.V.:857.6] Out: -   General appearance: alert and cooperative Chest wall: no tenderness Cardio: regular rate and rhythm, S1, S2 normal, no murmur, click, rub or gallop Extremities: 1-2+ edema  Lab Results:  Recent Labs  08/05/14 2113 08/07/14 0358  WBC 5.8 4.1  HGB 9.1* 9.2*  HCT 27.2* 27.8*  PLT 86* 71*   BMET:  Recent Labs  08/06/14 0447 08/07/14 0358  NA 140 141  K 3.7 3.6  CL 109 109  CO2 24 24  GLUCOSE 109* 130*  BUN 45* 42*  CREATININE 3.90* 3.49*  CALCIUM 7.6* 7.3*   No results for input(s): PTH in the last 72 hours. Iron Studies: No results for input(s): IRON, TIBC, TRANSFERRIN, FERRITIN in the last 72 hours. Studies/Results: Dg Chest 2 View  08/05/2014   CLINICAL DATA:  Chest pain and dyspnea for 1 week.  EXAM: CHEST  2 VIEW  COMPARISON:  11/30/2013  FINDINGS: There is mild unchanged cardiomegaly. There is prior sternotomy and CABG. The lungs are clear. The  pulmonary vasculature is normal. There are no pleural effusions. Hilar and mediastinal contours are unremarkable and unchanged.  IMPRESSION: Mild cardiomegaly.  No acute cardiopulmonary findings.   Electronically Signed   By: Andreas Newport M.D.   On: 08/05/2014 21:32   Scheduled: . [START ON 08/08/2014] aspirin EC  81 mg Oral Daily  . calcitRIOL  0.25 mcg Oral Once per day on Mon Wed Fri  . carvedilol  6.25 mg Oral BID WC  . clopidogrel  75 mg Oral Daily  . hydrALAZINE  50 mg Oral BID  . insulin aspart  0-9 Units Subcutaneous TID WC  . insulin glargine  10 Units Subcutaneous QHS  . isosorbide mononitrate  60 mg Oral Daily  . pregabalin  75 mg Oral BID  . ranolazine  500 mg Oral BID  . rosuvastatin  20 mg Oral q1800  . sodium bicarbonate  650 mg Oral BID     LOS: 2 days   Tiyonna Sardinha C 08/07/2014,10:03 AM

## 2014-08-07 NOTE — H&P (View-Only) (Signed)
Patient: Joseph [name] / Admit Date: 08/05/2014 / Date of Encounter: 08/07/2014, 8:39 AM   Subjective: No CP. Does report occasional dyspnea with lying down.    Objective: Telemetry: SB overnight (upper 40s-50s), NSR otherwise with occasional PVCs Physical Exam: Blood pressure 153/63, pulse 64, temperature 97.8 F (36.6 C), temperature source Oral, resp. rate 18, height 5' 10.5" (1.791 m), weight 218 lb 9.6 oz (99.156 kg), SpO2 96 %. General: Well developed, well nourished WM in no acute distress. Able to lay flat without any dyspnea during a trial run this AM. Head: Normocephalic, atraumatic, sclera non-icteric, no xanthomas, nares are without discharge. Neck: Negative for carotid bruits. JVP not elevated. Lungs: Clear bilaterally to auscultation without wheezes, rales, or rhonchi. Breathing is unlabored. Heart: RRR S1 S2 without murmurs, rubs, or gallops.  Abdomen: Soft, non-tender, non-distended with normoactive bowel sounds. No rebound/guarding. Extremities: No clubbing or cyanosis. No edema. Distal pedal pulses are 2+ and equal bilaterally. Neuro: Alert and oriented X 3. Moves all extremities spontaneously. Psych: Responds to questions appropriately with a normal affect.  Intake/Output Summary (Last 24 hours) at 08/07/14 0839 Last data filed at 08/07/14 0541  Gross per 24 hour  Intake 1200.59 ml  Output      0 ml  Net 1200.59 ml    Inpatient Medications:  . [START ON 08/08/2014] aspirin EC  81 mg Oral Daily  . calcitRIOL  0.25 mcg Oral Once per day on Mon Wed Fri  . carvedilol  6.25 mg Oral BID WC  . clopidogrel  75 mg Oral Daily  . hydrALAZINE  50 mg Oral BID  . insulin aspart  0-9 Units Subcutaneous TID WC  . insulin glargine  10 Units Subcutaneous QHS  . isosorbide mononitrate  30 mg Oral Daily  . pregabalin  75 mg Oral BID  . ranolazine  500 mg Oral BID  . rosuvastatin  20 mg Oral q1800  . sodium bicarbonate  650 mg Oral BID  . sodium polystyrene  15 g Oral Once  per day on Mon Wed Fri   Infusions:  . sodium chloride 1 mL/kg/hr (08/07/14 0742)  . heparin 2,000 Units/hr (08/07/14 0741)    Labs:  Recent Labs  08/06/14 0447 08/07/14 0358  NA 140 141  K 3.7 3.6  CL 109 109  CO2 24 24  GLUCOSE 109* 130*  BUN 45* 42*  CREATININE 3.90* 3.49*  CALCIUM 7.6* 7.3*    Recent Labs  08/06/14 0447  AST 23  ALT 13*  ALKPHOS 86  BILITOT 0.7  PROT 6.3*  ALBUMIN 2.8*    Recent Labs  08/05/14 2113 08/07/14 0358  WBC 5.8 4.1  HGB 9.1* 9.2*  HCT 27.2* 27.8*  MCV 87.5 85.5  PLT 86* 71*    Recent Labs  08/06/14 0447 08/06/14 1355 08/06/14 1718  TROPONINI 2.08* 1.80* 0.08*   Invalid input(s): POCBNP No results for input(s): HGBA1C in the last 72 hours.   Radiology/Studies:  Dg Chest 2 View  08/05/2014   CLINICAL DATA:  Chest pain and dyspnea for 1 week.  EXAM: CHEST  2 VIEW  COMPARISON:  11/30/2013  FINDINGS: There is mild unchanged cardiomegaly. There is prior sternotomy and CABG. The lungs are clear. The pulmonary vasculature is normal. There are no pleural effusions. Hilar and mediastinal contours are unremarkable and unchanged.  IMPRESSION: Mild cardiomegaly.  No acute cardiopulmonary findings.   Electronically Signed   By: Andreas Newport M.D.   On: 08/05/2014 21:32     Assessment  and Plan  [phone] with CAD s/p CABG 2001 followed by POBA, CKD (previously on dialysis for several months in 2015), prostate CA, anemia, thrombocytopenia, LV dysfunction EF 40-45% by echo 08/2013 who presents with recurrent chest pain concerning for angina. Had cath 07/30/14 with plans to wait a week for renal recovery then attempt PCI of LM and Cx. Had prophlactic HD for 2 sessions after angiography. H/o thrombocytopenia including back to 2009 which has been previously worked up with BMB per daughter and is just being monitored at this point - identical brother has AML but he himself has never been diagnosed with this. Followed by Dr. Ma Hillock at [facility].  Daughter also has low platelets. 2D echo shows EF 25-30% with multiple WMA, grade 2 DD, severely dilated LA, mildly dilated RV with moderately reduced systolic function, mildly dilated RA, mod increased PASP.  1. CAD here with NSTEMI - plan PCI today of distal left main/ostial LCx. Risks/benefits/alternatives discussed with patient yesterday and he is agreeable to proceed. Renal is on board. Continue aspirin, Plavix, heparin, nitrate, Ranexa. Would avoid LV gram and use as little contrast as possible.  2. AKI on CKD, previously had HD- but only prophylactic. (d/c Cr 3.03; up to 3.9 this admission) - appreciate renal input. Of note he was on kayexalate TID at home. I asked nursing to touch base with renal team about rescheduling this as they see appropriate because it would not be conducive for him to have diarrhea during the cath. Addendum: renal did not feel the patient needs this. Will d/c off MAR.  3. Essential HTN - titrate Imdur to 60mg  daily. If BP remains further elevated would increase hydralazine to 75mg  TID.  4. Chronic systolic CHF with interim worsening of EF - 25-30% with multiple WMA - no ACEI/ARB/spiro due to renal function. Continue Imdur/hydralazine. Weight stable. Tolerating pre-cath fluids so far. Would follow volume closely post-cath.  5. Chronic thrombocytopenia, worsening of chronic anemia without reported bleeding - follow closely. Hemoccult was negative. Platelet count chronically low 60-90K. This has been addressed previously. Will need to monitor with DAPT but no history of bleeding.   Signed, Melina Copa PA-C Pager: [phone]  Patient seen and examined and history reviewed. Agree with above findings and plan. Stable for PCI today. Hydrated overnight. Daughter wanted to know if hematology will see in hospital but since thrombocytopenia is chronic and stable I do not see the need.    Nona Gracey [place], [place] 08/07/2014 10:19 AM

## 2014-08-07 NOTE — Progress Notes (Signed)
Patient: Joseph [name] / Admit Date: 08/05/2014 / Date of Encounter: 08/07/2014, 8:39 AM   Subjective: No CP. Does report occasional dyspnea with lying down.    Objective: Telemetry: SB overnight (upper 40s-50s), NSR otherwise with occasional PVCs Physical Exam: Blood pressure 153/63, pulse 64, temperature 97.8 F (36.6 C), temperature source Oral, resp. rate 18, height 5' 10.5" (1.791 m), weight 218 lb 9.6 oz (99.156 kg), SpO2 96 %. General: Well developed, well nourished WM in no acute distress. Able to lay flat without any dyspnea during a trial run this AM. Head: Normocephalic, atraumatic, sclera non-icteric, no xanthomas, nares are without discharge. Neck: Negative for carotid bruits. JVP not elevated. Lungs: Clear bilaterally to auscultation without wheezes, rales, or rhonchi. Breathing is unlabored. Heart: RRR S1 S2 without murmurs, rubs, or gallops.  Abdomen: Soft, non-tender, non-distended with normoactive bowel sounds. No rebound/guarding. Extremities: No clubbing or cyanosis. No edema. Distal pedal pulses are 2+ and equal bilaterally. Neuro: Alert and oriented X 3. Moves all extremities spontaneously. Psych: Responds to questions appropriately with a normal affect.  Intake/Output Summary (Last 24 hours) at 08/07/14 0839 Last data filed at 08/07/14 0541  Gross per 24 hour  Intake 1200.59 ml  Output      0 ml  Net 1200.59 ml    Inpatient Medications:  . [START ON 08/08/2014] aspirin EC  81 mg Oral Daily  . calcitRIOL  0.25 mcg Oral Once per day on Mon Wed Fri  . carvedilol  6.25 mg Oral BID WC  . clopidogrel  75 mg Oral Daily  . hydrALAZINE  50 mg Oral BID  . insulin aspart  0-9 Units Subcutaneous TID WC  . insulin glargine  10 Units Subcutaneous QHS  . isosorbide mononitrate  30 mg Oral Daily  . pregabalin  75 mg Oral BID  . ranolazine  500 mg Oral BID  . rosuvastatin  20 mg Oral q1800  . sodium bicarbonate  650 mg Oral BID  . sodium polystyrene  15 g Oral Once  per day on Mon Wed Fri   Infusions:  . sodium chloride 1 mL/kg/hr (08/07/14 0742)  . heparin 2,000 Units/hr (08/07/14 0741)    Labs:  Recent Labs  08/06/14 0447 08/07/14 0358  NA 140 141  K 3.7 3.6  CL 109 109  CO2 24 24  GLUCOSE 109* 130*  BUN 45* 42*  CREATININE 3.90* 3.49*  CALCIUM 7.6* 7.3*    Recent Labs  08/06/14 0447  AST 23  ALT 13*  ALKPHOS 86  BILITOT 0.7  PROT 6.3*  ALBUMIN 2.8*    Recent Labs  08/05/14 2113 08/07/14 0358  WBC 5.8 4.1  HGB 9.1* 9.2*  HCT 27.2* 27.8*  MCV 87.5 85.5  PLT 86* 71*    Recent Labs  08/06/14 0447 08/06/14 1355 08/06/14 1718  TROPONINI 2.08* 1.80* 0.08*   Invalid input(s): POCBNP No results for input(s): HGBA1C in the last 72 hours.   Radiology/Studies:  Dg Chest 2 View  08/05/2014   CLINICAL DATA:  Chest pain and dyspnea for 1 week.  EXAM: CHEST  2 VIEW  COMPARISON:  11/30/2013  FINDINGS: There is mild unchanged cardiomegaly. There is prior sternotomy and CABG. The lungs are clear. The pulmonary vasculature is normal. There are no pleural effusions. Hilar and mediastinal contours are unremarkable and unchanged.  IMPRESSION: Mild cardiomegaly.  No acute cardiopulmonary findings.   Electronically Signed   By: Andreas Newport M.D.   On: 08/05/2014 21:32     Assessment  and Plan  [phone] with CAD s/p CABG 2001 followed by POBA, CKD (previously on dialysis for several months in 2015), prostate CA, anemia, thrombocytopenia, LV dysfunction EF 40-45% by echo 08/2013 who presents with recurrent chest pain concerning for angina. Had cath 07/30/14 with plans to wait a week for renal recovery then attempt PCI of LM and Cx. Had prophlactic HD for 2 sessions after angiography. H/o thrombocytopenia including back to 2009 which has been previously worked up with BMB per daughter and is just being monitored at this point - identical brother has AML but he himself has never been diagnosed with this. Followed by Dr. Ma Hillock at [facility].  Daughter also has low platelets. 2D echo shows EF 25-30% with multiple WMA, grade 2 DD, severely dilated LA, mildly dilated RV with moderately reduced systolic function, mildly dilated RA, mod increased PASP.  1. CAD here with NSTEMI - plan PCI today of distal left main/ostial LCx. Risks/benefits/alternatives discussed with patient yesterday and he is agreeable to proceed. Renal is on board. Continue aspirin, Plavix, heparin, nitrate, Ranexa. Would avoid LV gram and use as little contrast as possible.  2. AKI on CKD, previously had HD- but only prophylactic. (d/c Cr 3.03; up to 3.9 this admission) - appreciate renal input. Of note he was on kayexalate TID at home. I asked nursing to touch base with renal team about rescheduling this as they see appropriate because it would not be conducive for him to have diarrhea during the cath. Addendum: renal did not feel the patient needs this. Will d/c off MAR.  3. Essential HTN - titrate Imdur to 60mg  daily. If BP remains further elevated would increase hydralazine to 75mg  TID.  4. Chronic systolic CHF with interim worsening of EF - 25-30% with multiple WMA - no ACEI/ARB/spiro due to renal function. Continue Imdur/hydralazine. Weight stable. Tolerating pre-cath fluids so far. Would follow volume closely post-cath.  5. Chronic thrombocytopenia, worsening of chronic anemia without reported bleeding - follow closely. Hemoccult was negative. Platelet count chronically low 60-90K. This has been addressed previously. Will need to monitor with DAPT but no history of bleeding.   Signed, Melina Copa PA-C Pager: [phone]  Patient seen and examined and history reviewed. Agree with above findings and plan. Stable for PCI today. Hydrated overnight. Daughter wanted to know if hematology will see in hospital but since thrombocytopenia is chronic and stable I do not see the need.    Woodford Strege [place], [place] 08/07/2014 10:19 AM

## 2014-08-07 NOTE — Progress Notes (Signed)
[place] for Heparin Indication: chest pain/ACS  Allergies  Allergen Reactions  . Sulfa Antibiotics Itching    hives    Patient Measurements: Height: 5' 10.5" (179.1 cm) Weight: 218 lb 11.1 oz (99.2 kg) IBW/kg (Calculated) : 74.15  Heparin Dosing Weight: 94 kg  Vital Signs: Temp: 97.8 F (36.6 C) (07/01 0633) Temp Source: Oral (07/01 2518) BP: 174/83 mmHg (07/01 0837) Pulse Rate: 64 (07/01 0837)  Labs:  Recent Labs  08/05/14 2113 08/06/14 0447  08/06/14 1355 08/06/14 1718 08/06/14 1719 08/07/14 0237 08/07/14 0358 08/07/14 1135  HGB 9.1*  --   --   --   --   --   --  9.2*  --   HCT 27.2*  --   --   --   --   --   --  27.8*  --   PLT 86*  --   --   --   --   --   --  71*  --   HEPARINUNFRC  --   --   < >  --   --  0.19* 0.18*  --  0.52  CREATININE 3.83* 3.90*  --   --   --   --   --  3.49*  --   TROPONINI  --  2.08*  --  1.80* 0.08*  --   --   --   --   < > = values in this interval not displayed.  Estimated Creatinine Clearance: 24.8 mL/min (by C-G formula based on Cr of 3.49).  Assessment: 69 y.o. male on heparin for ACS. Heparin level now at goal (0.5). No issues with bleeding noted. Chronic thrombocytopenia with pltc of 71 this morning (stable)  Plan is for cath for PCI today.     Renal does not think prophylactic hemodialysis is needed, will limit dye exposure.  Goal of Therapy:  Heparin level 0.3-0.7 units/ml Monitor platelets by anticoagulation protocol: Yes   Plan:  Continue IV heparin at 2000 units/hr Follow up [place] PharmD., BCPS Clinical Pharmacist Pager [phone] 08/07/2014 12:42 PM

## 2014-08-07 NOTE — Interval H&P Note (Signed)
Cath Lab Visit (complete for each Cath Lab visit)  Clinical Evaluation Leading to the Procedure:   ACS: No.  Non-ACS:    Anginal Classification: CCS III  Anti-ischemic medical therapy: Maximal Therapy (2 or more classes of medications)  Non-Invasive Test Results: No non-invasive testing performed  Prior CABG: Previous CABG      History and Physical Interval Note:  08/07/2014 2:45 PM  Joseph [name]  has presented today for surgery, with the diagnosis of CAD  The various methods of treatment have been discussed with the patient and family. After consideration of risks, benefits and other options for treatment, the patient has consented to  Procedure(s): Coronary Stent Intervention (N/A) as a surgical intervention .  The patient's history has been reviewed, patient examined, no change in status, stable for surgery.  I have reviewed the patient's chart and labs.  Questions were answered to the patient's satisfaction.     Quay Burow

## 2014-08-07 NOTE — Progress Notes (Signed)
[place] for Heparin Indication: chest pain/ACS  Allergies  Allergen Reactions  . Sulfa Antibiotics Itching    hives    Patient Measurements: Height: 5' 10.5" (179.1 cm) Weight: 218 lb 9.6 oz (99.156 kg) IBW/kg (Calculated) : 74.15  Heparin Dosing Weight: 94 kg  Vital Signs: Temp: 97.8 F (36.6 C) (06/30 1933) Temp Source: Oral (06/30 1933) BP: 155/64 mmHg (06/30 1933) Pulse Rate: 57 (06/30 1933)  Labs:  Recent Labs  08/05/14 2113 08/06/14 0447 08/06/14 0759 08/06/14 1355 08/06/14 1718 08/06/14 1719 08/07/14 0237  HGB 9.1*  --   --   --   --   --   --   HCT 27.2*  --   --   --   --   --   --   PLT 86*  --   --   --   --   --   --   HEPARINUNFRC  --   --  0.25*  --   --  0.19* 0.18*  CREATININE 3.83* 3.90*  --   --   --   --   --   TROPONINI  --  2.08*  --  1.80* 0.08*  --   --     Estimated Creatinine Clearance: 22.2 mL/min (by C-G formula based on Cr of 3.9).  Assessment: 69 y.o. male on heparin for ACS. Heparin level remains subtherapeutc (0.18) - no movement past increase last night. No issues with line or bleeding per RN. Plan back to cath lab for PCI today.     Goal of Therapy:  Heparin level 0.3-0.7 units/ml Monitor platelets by anticoagulation protocol: Yes   Plan:  Increase IV heparin to 2000 units/hr Will f/u 8 hr heparin level or f/u post cath  Sherlon Handing, PharmD, BCPS Clinical pharmacist, pager [phone]  08/07/2014 3:46 AM

## 2014-08-08 ENCOUNTER — Encounter (HOSPITAL_COMMUNITY): Payer: Self-pay | Admitting: Physician Assistant

## 2014-08-08 LAB — CBC
HCT: 28.8 % — ABNORMAL LOW (ref 39.0–52.0)
HEMOGLOBIN: 9.4 g/dL — AB (ref 13.0–17.0)
MCH: 28.1 pg (ref 26.0–34.0)
MCHC: 32.6 g/dL (ref 30.0–36.0)
MCV: 86.2 fL (ref 78.0–100.0)
Platelets: 82 10*3/uL — ABNORMAL LOW (ref 150–400)
RBC: 3.34 MIL/uL — AB (ref 4.22–5.81)
RDW: 15.4 % (ref 11.5–15.5)
WBC: 4.7 10*3/uL (ref 4.0–10.5)

## 2014-08-08 LAB — BASIC METABOLIC PANEL
ANION GAP: 7 (ref 5–15)
BUN: 40 mg/dL — AB (ref 6–20)
CO2: 25 mmol/L (ref 22–32)
CREATININE: 3.52 mg/dL — AB (ref 0.61–1.24)
Calcium: 7.6 mg/dL — ABNORMAL LOW (ref 8.9–10.3)
Chloride: 107 mmol/L (ref 101–111)
GFR calc Af Amer: 19 mL/min — ABNORMAL LOW (ref >60)
GFR calc non Af Amer: 17 mL/min — ABNORMAL LOW (ref >60)
Glucose, Bld: 176 mg/dL — ABNORMAL HIGH (ref 65–99)
Potassium: 3.7 mmol/L (ref 3.5–5.1)
Sodium: 139 mmol/L (ref 135–145)

## 2014-08-08 LAB — GLUCOSE, CAPILLARY: Glucose-Capillary: 147 mg/dL — ABNORMAL HIGH (ref 65–99)

## 2014-08-08 MED ORDER — HYDROCODONE-ACETAMINOPHEN 5-325 MG PO TABS
1.0000 | ORAL_TABLET | Freq: Once | ORAL | Status: AC
  Administered 2014-08-08: 12:00:00 1 via ORAL
  Filled 2014-08-08: qty 1

## 2014-08-08 MED ORDER — CARVEDILOL 12.5 MG PO TABS
6.2500 mg | ORAL_TABLET | Freq: Two times a day (BID) | ORAL | Status: DC
Start: 2014-08-08 — End: 2014-11-08

## 2014-08-08 MED ORDER — NITROGLYCERIN 0.4 MG SL SUBL: 0.4000 mg | SUBLINGUAL_TABLET | SUBLINGUAL | Status: DC | PRN

## 2014-08-08 MED ORDER — CLOPIDOGREL BISULFATE 75 MG PO TABS: 75.0000 mg | ORAL_TABLET | Freq: Every day | ORAL | Status: DC

## 2014-08-08 MED ORDER — NITROGLYCERIN 0.4 MG/SPRAY TL SOLN: 1.0000 | Status: AC | PRN

## 2014-08-08 NOTE — Progress Notes (Addendum)
CARDIAC REHAB PHASE I   PRE:  Rate/Rhythm: 68 SR  BP:   Sitting: 183/91    SaO2: 96% RA  MODE:  Ambulation: 580 ft   POST:  Rate/Rhythm: 82 SR PVC BP:   Sitting:202/75;143/75     SaO2: 96% RA  Pt ambulated 557ft with minimal pain 2/10 on L knee. Pt was eager to be moving and overall felt great. Returned to recliner with LE elevated and elevated BP 202/75; rechecked 3 minutes later and BP was 143/75. Educated pt on risk factors, restrictions, exercise guidelines, warning/stop signs and NTG use. Pt is interested in [name] in [name].   [phone]  Joseph Dusek D Amario Longmore,MS,ACSM-RCEP 08/08/2014 9:23 AM

## 2014-08-08 NOTE — Discharge Summary (Signed)
Discharge Summary   Patient ID: Joseph [name],  MRN: [id number], DOB/AGE: 03/20/1945 69 y.o.  Admit date: 08/05/2014 Discharge date: 08/08/2014  Primary Care Provider: Eason,  Ernest B Primary Cardiologist: Dr. Harding  Discharge Diagnoses Principal Problem:   NSTEMI (non-ST elevated myocardial infarction) Active Problems:   CAD (coronary artery disease)   CKD (chronic kidney disease)   Thrombocytopenia   Allergies Allergies  Allergen Reactions  . Sulfa Antibiotics Itching    hives    Procedures  Echocardiogram 08/06/2014 LV EF: 25% -  30%  ------------------------------------------------------------------- Indications:   CAD of native vessels 414.01.  ------------------------------------------------------------------- History:  PMH: Chronic kidney disease. Acute myocardial infarction. Congestive heart failure. Risk factors: Diabetes mellitus.  ------------------------------------------------------------------- Study Conclusions  - Left ventricle: The cavity size was mildly dilated. Wall thickness was normal. Systolic function was severely reduced. The estimated ejection fraction was in the range of 25% to 30%. Akinesis and scarring; consistent with infarction in the distribution of the right coronary artery. Hypokinesis of the basal-midanterolateral myocardium. Mild hypokinesis of the apicalanteroseptal, anterior, inferoseptal, and apical myocardium. There was a reduced contribution of atrial contraction to ventricular filling, due to increased ventricular diastolic pressure or atrial contractile dysfunction. Features are consistent with a pseudonormal left ventricular filling pattern, with concomitant abnormal relaxation and increased filling pressure (grade 2 diastolic dysfunction). - Left atrium: The atrium was severely dilated. - Right ventricle: The cavity size was mildly dilated. Systolic function was moderately  reduced. - Right atrium: The atrium was mildly dilated. - Pulmonary arteries: Systolic pressure was moderately increased. PA peak pressure: 49 mm Hg (S).     Cardiac catheterization 08/07/2014 Conclusion     Ost LAD to Prox LAD lesion, 100% stenosed.  Ost RCA to Mid RCA lesion, 100% stenosed.  Ost Cx lesion, 90% stenosed. There is a 0% residual stenosis post intervention.  Ost LM to LM lesion, 90% stenosed. There is a 0% residual stenosis post intervention.     IMPRESSION:Joseph [name] had successful PCI and stenting of distal left main/ostial circumflex disease drug-eluting stent (2.5 mm x 18 mm long) deployed at 16 atm (2.43 mm) resulting in reduction of 90% stenosis to 0% residual. The patient was already on dual antiplatelet therapy. A total of 60 mL of contrast was administered. Weight-based bivalirudin was used with a therapeutic ACT. The guidewire and catheter were removed. The sheath was secured. Intervention was discontinued. In the emergency room several hours and pressure held. Patient will be hydrated and his renal function will be assessed. He should remain on anti-platelet therapy indefinitely given the location of the stent    Hospital Course  Joseph [name] is a 69-year-old Caucasian male with past medical history of CAD status post CABG in 2001, chronic thrombocytopenia, CKD stage IV, prostate cancer who recently underwent coronary angiography by Dr. Khan on 07/30/2014 which showed all vein grafts occluded, LIMA to LAD patent, unchanged from his last cath in 2009, patient did have 70% ostial left main lesion and 70% ostial left circumflex lesion with 100% occluded proximal RCA. Patient presented to [facility] on 08/06/2014 with worsening angina even at rest, with associated symptom of shortness breath. According to the patient, he was in the process of establishing cardiology care with Dr. Harding of [facility]. We originally attempted to have the patient seen by Dr. Harwani  as his previous cardiologist Dr. Khan has discussed the case with Dr. Hawarni, however patient insisted on being seen by [facility], therefore we have resumed care in this case   after notifying Dr. Harwani via cath lab.   Echocardiogram was obtained on 67/30/2016 which showed EF 25-30%, grade 2 diastolic dysfunction, akinesis of RCA territory, hypokinesis of basal mid anterolateral myocardium, mild hypokinesis of apical anterolateral, anterior, inferoseptal and apical myocardium, PA peak pressure 49. The case was discussed with interventional team who has reviewed his previous cath and agree that PCI would be beneficial in this case. However he has stage IV chronic kidney disease and recently underwent prophylactic dialysis and at higher risk of contrast nephropathy. He also has chronic thrombocytopenia which appears to be stable at this point. Nephrology was consulted on 08/07/2014 who recommended IV fluid as tolerated pre-cath, minimize contrast dye. Nephrology did not recommend prophylactic hemodialysis during this admission. Eventually the patient underwent cardiac catheterization on 08/07/2014, the distal left main stenosis and ostial left circumflex stenosis was treated with Synergy DES 2.5 mm x 18 mm with 0% residual. Post cath, patient did well overnight.  He was seen on the following day on 08/08/2014, at which time he was doing well without significant chest discomfort or shortness breath. His right femoral cath site appears to be stable without significant bleeding or hematoma. He is deemed stable for discharge from cardiology perspective. His Cr has been stable on day 1 after cath, however he continue to be at risk for late onset contrast nephropathy which usually occur after 48 hours. After discussing was M.D., we will obtain a BMET at [place] early next week to monitor his renal function. Since he has not been officially established with Dr. Harding yet, I will ask him to have results faxed both to his  nephrologist and Dr. Harding's office. I will arrange close outpatient follow-up with Dr. Harding. He will also need close follow-up with nephrology as outpatient, per patient, he has a follow-up with his nephrologist at [place] within 2 weeks.  Of note, his daughter is his health power of attorny, contact [phone]  Discharge Vitals Blood pressure 161/67, pulse 70, temperature 97.9 F (36.6 C), temperature source Oral, resp. rate 18, height 5' 10.5" (1.791 m), weight 225 lb 1.4 oz (102.1 kg), SpO2 94 %.  Filed Weights   08/07/14 0837 08/08/14 0004 08/08/14 0343  Weight: 218 lb 11.1 oz (99.2 kg) 225 lb 1.4 oz (102.1 kg) 225 lb 1.4 oz (102.1 kg)    Labs  CBC  Recent Labs  08/07/14 0358 08/08/14 0307  WBC 4.1 4.7  HGB 9.2* 9.4*  HCT 27.8* 28.8*  MCV 85.5 86.2  PLT 71* 82*   Basic Metabolic Panel  Recent Labs  08/07/14 0358 08/08/14 0307  NA 141 139  K 3.6 3.7  CL 109 107  CO2 24 25  GLUCOSE 130* 176*  BUN 42* 40*  CREATININE 3.49* 3.52*  CALCIUM 7.3* 7.6*   Liver Function Tests  Recent Labs  08/06/14 0447  AST 23  ALT 13*  ALKPHOS 86  BILITOT 0.7  PROT 6.3*  ALBUMIN 2.8*   Cardiac Enzymes  Recent Labs  08/06/14 0447 08/06/14 1355 08/06/14 1718  TROPONINI 2.08* 1.80* 0.08*    Disposition  Pt is being discharged home today in good condition.  Follow-up Plans & Appointments      Follow-up Information    Follow up with HARDING, DAVID W, MD.   Specialty:  Cardiology   Why:  Office will contact you to arrange followup, please give us a call if you do not hear from us in 2 business days.    Contact information:   [address] [place] [address] [phone]       Please follow up.   Why:  Please obtain Basic Metabolic Panel at [facility] next Tue 7/5 and has result sent to Dr. Allison Quarry office      Please follow up.   Why:  Please followup with your nephrology as outpatient.      Discharge Medications      Medication List    TAKE these medications        ALPRAZolam 0.5 MG tablet  Commonly known as:  XANAX  Take 0.5 mg by mouth at bedtime as needed for anxiety or sleep.     aspirin 81 MG tablet  Take 81 mg by mouth daily.     calcitRIOL 0.25 MCG capsule  Commonly known as:  ROCALTROL  Take 0.25 mcg by mouth 3 (three) times a week. On Monday, Wednesday, and Friday     carvedilol 12.5 MG tablet  Commonly known as:  COREG  Take 0.5 tablets (6.25 mg total) by mouth 2 (two) times daily with a meal.     clopidogrel 75 MG tablet  Commonly known as:  PLAVIX  Take 1 tablet (75 mg total) by mouth daily.     CRESTOR 10 MG tablet  Generic drug:  rosuvastatin  Take 20 mg by mouth daily.     hydrALAZINE 50 MG tablet  Commonly known as:  APRESOLINE  Take 50 mg by mouth 2 (two) times daily.     isosorbide mononitrate 30 MG 24 hr tablet  Commonly known as:  IMDUR  Take 30 mg by mouth daily.     LANTUS SOLOSTAR 100 UNIT/ML Solostar Pen  Generic drug:  Insulin Glargine  Inject 20 Units into the skin at bedtime.     LYRICA 75 MG capsule  Generic drug:  pregabalin  Take 75 mg by mouth 2 (two) times daily.     nitroGLYCERIN 0.4 MG SL tablet  Commonly known as:  NITROSTAT  Place 1 tablet (0.4 mg total) under the tongue every 5 (five) minutes as needed for chest pain.     ranolazine 500 MG 12 hr tablet  Commonly known as:  RANEXA  Take 1 tablet (500 mg total) by mouth 2 (two) times daily.     sodium bicarbonate 650 MG tablet  Take 650 mg by mouth 2 (two) times daily.     sodium polystyrene 15 GM/60ML suspension  Commonly known as:  KAYEXALATE  Take 15 g by mouth 3 (three) times a week. Mon / Wed / Fri     torsemide 20 MG tablet  Commonly known as:  DEMADEX  Take 40 mg by mouth daily as needed (edema).     Vitamin D (Ergocalciferol) 50000 UNITS Caps capsule  Commonly known as:  DRISDOL  Take 50,000 Units by mouth every 30 (thirty) days.        Outstanding  Labs/Studies  BMET next Tuesday at [facility], have result sent to Dr. Allison Quarry office and his Nephrologist  Duration of Discharge Encounter   Greater than 30 minutes including physician time.  Hilbert Corrigan PA-C Pager: [phone] 08/08/2014, 9:17 AM

## 2014-08-08 NOTE — Progress Notes (Signed)
Patient Name: Joseph [name] Date of Encounter: 08/08/2014  Cardiologist: previously Dr. Humphrey Rolls, [place], however planned to switch to Dr. Ellyn Hack   Principal Problem:   NSTEMI (non-ST elevated myocardial infarction) Active Problems:   CAD (coronary artery disease)   CKD (chronic kidney disease)   Thrombocytopenia    SUBJECTIVE  Denies significant SOB, mild transient chest pressure with breathing last night, quickly resolved and has not came back  CURRENT MEDS . aspirin EC  81 mg Oral Daily  . calcitRIOL  0.25 mcg Oral Once per day on Mon Wed Fri  . carvedilol  6.25 mg Oral BID WC  . clopidogrel  75 mg Oral Daily  . insulin aspart  0-9 Units Subcutaneous TID WC  . insulin glargine  10 Units Subcutaneous QHS  . isosorbide mononitrate  60 mg Oral Daily  . pregabalin  75 mg Oral BID  . ranolazine  500 mg Oral BID  . rosuvastatin  20 mg Oral q1800  . sodium bicarbonate  650 mg Oral BID    OBJECTIVE  Filed Vitals:   08/07/14 2200 08/08/14 0004 08/08/14 0323 08/08/14 0343  BP: 165/64 155/54 159/70   Pulse:  68 73   Temp:  98 F (36.7 C) 97.8 F (36.6 C)   TempSrc:  Oral Oral   Resp: 22 19 21    Height:      Weight:  225 lb 1.4 oz (102.1 kg)  225 lb 1.4 oz (102.1 kg)  SpO2:  92% 94%     Intake/Output Summary (Last 24 hours) at 08/08/14 0726 Last data filed at 08/08/14 0323  Gross per 24 hour  Intake  857.6 ml  Output   1125 ml  Net -267.4 ml   Filed Weights   08/07/14 0837 08/08/14 0004 08/08/14 0343  Weight: 218 lb 11.1 oz (99.2 kg) 225 lb 1.4 oz (102.1 kg) 225 lb 1.4 oz (102.1 kg)    PHYSICAL EXAM  General: Pleasant, NAD. Neuro: Alert and oriented X 3. Moves all extremities spontaneously. Psych: Normal affect. HEENT:  Normal  Neck: Supple without bruits or JVD. Lungs:  Resp regular and unlabored, CTA. Heart: RRR no s3, s4, or murmurs. R groin cath site stable.  Abdomen: Soft, non-tender, non-distended, BS + x 4.  Extremities: No clubbing, cyanosis or  edema. DP/PT/Radials 2+ and equal bilaterally.  Accessory Clinical Findings  CBC  Recent Labs  08/07/14 0358 08/08/14 0307  WBC 4.1 4.7  HGB 9.2* 9.4*  HCT 27.8* 28.8*  MCV 85.5 86.2  PLT 71* 82*   Basic Metabolic Panel  Recent Labs  08/07/14 0358 08/08/14 0307  NA 141 139  K 3.6 3.7  CL 109 107  CO2 24 25  GLUCOSE 130* 176*  BUN 42* 40*  CREATININE 3.49* 3.52*  CALCIUM 7.3* 7.6*   Liver Function Tests  Recent Labs  08/06/14 0447  AST 23  ALT 13*  ALKPHOS 86  BILITOT 0.7  PROT 6.3*  ALBUMIN 2.8*   Cardiac Enzymes  Recent Labs  08/06/14 0447 08/06/14 1355 08/06/14 1718  TROPONINI 2.08* 1.80* 0.08*    TELE NSR with HR 50-60s    ECG  NSR with TWI in lateral leads  Echocardiogram  LV EF: 25% -  30%  ------------------------------------------------------------------- Indications:   CAD of native vessels 414.01.  ------------------------------------------------------------------- History:  PMH: Chronic kidney disease. Acute myocardial infarction. Congestive heart failure. Risk factors: Diabetes mellitus.  ------------------------------------------------------------------- Study Conclusions  - Left ventricle: The cavity size was mildly dilated. Wall thickness was  normal. Systolic function was severely reduced. The estimated ejection fraction was in the range of 25% to 30%. Akinesis and scarring; consistent with infarction in the distribution of the right coronary artery. Hypokinesis of the basal-midanterolateral myocardium. Mild hypokinesis of the apicalanteroseptal, anterior, inferoseptal, and apical myocardium. There was a reduced contribution of atrial contraction to ventricular filling, due to increased ventricular diastolic pressure or atrial contractile dysfunction. Features are consistent with a pseudonormal left ventricular filling pattern, with concomitant abnormal relaxation and  increased filling pressure (grade 2 diastolic dysfunction). - Left atrium: The atrium was severely dilated. - Right ventricle: The cavity size was mildly dilated. Systolic function was moderately reduced. - Right atrium: The atrium was mildly dilated. - Pulmonary arteries: Systolic pressure was moderately increased. PA peak pressure: 49 mm Hg (S).    Radiology/Studies  Dg Chest 2 View  08/05/2014   CLINICAL DATA:  Chest pain and dyspnea for 1 week.  EXAM: CHEST  2 VIEW  COMPARISON:  11/30/2013  FINDINGS: There is mild unchanged cardiomegaly. There is prior sternotomy and CABG. The lungs are clear. The pulmonary vasculature is normal. There are no pleural effusions. Hilar and mediastinal contours are unremarkable and unchanged.  IMPRESSION: Mild cardiomegaly.  No acute cardiopulmonary findings.   Electronically Signed   By: Andreas Newport M.D.   On: 08/05/2014 21:32    ASSESSMENT AND PLAN  42M with CAD s/p CABG 2001 followed by POBA, CKD (previously on dialysis for several months in 2015), prostate CA, anemia, thrombocytopenia, LV dysfunction EF 40-45% by echo 08/2013 who presents with recurrent chest pain concerning for angina. Had cath 07/30/14 with plans to wait a week for renal recovery then attempt PCI of LM and Cx. Had prophylactic HD for 2 sessions after angiography. H/o thrombocytopenia including back to 2009 which has been previously worked up with BMB per daughter and is just being monitored at this point - identical brother has AML but he himself has never been diagnosed with this. Followed by Dr. Ma Hillock at [place]. Daughter also has low platelets. 2D echo shows EF 25-30% with multiple WMA, grade 2 DD, severely dilated LA, mildly dilated RV with moderately reduced systolic function, mildly dilated RA, mod increased PASP.  1. NSTEMI with known CAD  - cath 08/07/2014 s/p successful PCI and SYNERGY DES (2.5 mm x 18 mm long) to ostial LCx and distal L main.  - continue ASA, coreg,  plavix, imdur, statin and ranexa.   - likely discharge today and has close BMET in the clinic on Tue  2. AKI on CKD, previously requiring HD (d/c Cr 3.03; up to 3.9 this admission)  3. Chronic systolic CHF with interim worsening of EF - 25-30% with multiple WMA  4. Chronic thrombocytopenia  5. Worsening of chronic anemia without reported bleeding  6. IDDM type 2  Signed, Almyra Deforest PA-C Pager: [phone]

## 2014-08-08 NOTE — Discharge Instructions (Signed)
Acute Coronary Syndrome  Acute coronary syndrome (ACS) is an urgent problem in which the blood and oxygen supply to the heart is critically deficient. ACS requires hospitalization because one or more coronary arteries may be blocked.  ACS represents a range of conditions including:  · Previous angina that is now unstable, lasts longer, happens at rest, or is more intense.  · A heart attack, with heart muscle cell injury and death.  There are three vital coronary arteries that supply the heart muscle with blood and oxygen so that it can pump blood effectively. If blockages to these arteries develop, blood flow to the heart muscle is reduced. If the heart does not get enough blood, angina may occur as the first warning sign.  SYMPTOMS   · The most common signs of angina include:  ¨ Tightness or squeezing in the chest.  ¨ Feeling of heaviness on the chest.  ¨ Discomfort in the arms, neck, back, or jaw.  ¨ Shortness of breath and nausea.  ¨ Cold, wet skin.  · Angina is usually brought on by physical effort or excitement which increase the oxygen needs of the heart. These states increase the blood flow needs of the heart beyond what can be delivered.  · Other symptoms that are not as common include:  ¨ Fatigue  ¨ Unexplained feelings of nervousness or anxiety  ¨ Weakness  ¨ Diarrhea  · Sometimes, you may not have noticed any symptoms at all but still suffered a cardiac injury.  TREATMENT   · Medicines to help discomfort may include nitroglycerin (nitro) in the form of tablets or a spray for rapid relief, or longer-acting forms such as cream, patches, or capsules. (Be aware that there are many side effects and possible interactions with other drugs).  · Other medicines may be used to help the heart pump better.  · Procedures to open blocked arteries including angioplasty or stent placement to keep the arteries open.  · Open heart surgery may be needed when there are many blockages or they are in critical locations that  are best treated with surgery.  HOME CARE INSTRUCTIONS   · Do not use any tobacco products including cigarettes, chewing tobacco, or electronic cigarettes.  · Take one baby or adult aspirin daily, if your health care provider advises. This helps reduce the risk of a heart attack.  · It is very important that you follow the angina treatment prescribed by your health care provider. Make arrangements for proper follow-up care.  · Eat a heart healthy diet with salt and fat restrictions as advised.  · Regular exercise is good for you as long as it does not cause discomfort. Do not begin any new type of exercise until you check with your health care provider.  · If you are overweight, you should lose weight.  · Try to maintain normal blood lipid levels.  · Keep your blood pressure under control as recommended by your health care provider.  · You should tell your health care provider right away about any increase in the severity or frequency of your chest discomfort or angina attacks. When you have angina, you should stop what you are doing and sit down. This may bring relief in 3 to 5 minutes. If your health care provider has prescribed nitro, take it as directed.  · If your health care provider has given you a follow-up appointment, it is very important to keep that appointment. Not keeping the appointment could result in a chronic or   of breath. °· You feel faint, lightheaded, or pass out. °· Your chest discomfort gets worse. °· You are sweating or experience sudden profound fatigue. °· You do not get relief of your chest pain after 3 doses of nitro. °· Your discomfort lasts longer than 15 minutes. °MAKE SURE YOU:  °· Understand these instructions. °· Will watch your condition. °· Will get help right  away if you are not doing well or get worse. °· Take all medicines as directed by your health care provider. °Document Released: 01/23/2005 Document Revised: 01/28/2013 Document Reviewed: 05/27/2013 °ExitCare® Patient Information ©2015 [employer]. This information is not intended to replace advice given to you by your health care provider. Make sure you discuss any questions you have with your health care provider. ° °No driving for 24hours. No lifting over 5 lbs for 1 week. No sexual activity for 1 week. Keep procedure site clean & dry. If you notice increased pain, swelling, bleeding or pus, call/return!  You may shower, but no soaking baths/hot tubs/pools for 1 week.  ° ° °

## 2014-08-08 NOTE — Progress Notes (Signed)
Patient daughter states Dr. Debara Pickett told them he can potentially come off renaxa and just continue on Imdur, I have removed renaxa from the medication lists.   Hilbert Corrigan PA Pager: [phone]

## 2014-08-08 NOTE — Progress Notes (Signed)
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  Subjective: Interval History: Feels better  Objective: Vital signs in last 24 hours: Temp:  [97.8 F (36.6 C)-98.1 F (36.7 C)] 97.9 F (36.6 C) (07/02 0807) Pulse Rate:  [50-144] 70 (07/02 0807) Resp:  [10-23] 18 (07/02 0807) BP: (137-180)/(54-94) 161/67 mmHg (07/02 0807) SpO2:  [92 %-100 %] 94 % (07/02 0807) Weight:  [102.1 kg (225 lb 1.4 oz)] 102.1 kg (225 lb 1.4 oz) (07/02 0343) Weight change:   Intake/Output from previous day: 07/01 0701 - 07/02 0700 In: 857.6 [I.V.:857.6] Out: 1125 [Urine:1125] Intake/Output this shift: Total I/O In: 120 [P.O.:120] Out: 400 [Urine:400]  General appearance: looks well  RLE edema>lle AVF LUE  Lab Results:  Recent Labs  08/07/14 0358 08/08/14 0307  WBC 4.1 4.7  HGB 9.2* 9.4*  HCT 27.8* 28.8*  PLT 71* 82*   BMET:  Recent Labs  08/07/14 0358 08/08/14 0307  NA 141 139  K 3.6 3.7  CL 109 107  CO2 24 25  GLUCOSE 130* 176*  BUN 42* 40*  CREATININE 3.49* 3.52*  CALCIUM 7.3* 7.6*   No results for input(s): PTH in the last 72 hours. Iron Studies: No results for input(s): IRON, TIBC, TRANSFERRIN, FERRITIN in the last 72 hours. Studies/Results: No results found.  Scheduled: . aspirin EC  81 mg Oral Daily  . calcitRIOL  0.25 mcg Oral Once per day on Mon Wed Fri  . carvedilol  6.25 mg Oral BID WC  . clopidogrel  75 mg Oral Daily  . insulin aspart  0-9 Units Subcutaneous TID WC  . insulin glargine  10 Units Subcutaneous QHS  . isosorbide mononitrate  60 mg Oral Daily  . pregabalin  75 mg Oral BID  . ranolazine  500 mg Oral BID  . rosuvastatin  20 mg Oral q1800  . sodium  bicarbonate  650 mg Oral BID     LOS: 3 days   Willow Shidler C 08/08/2014,12:07 PM

## 2014-08-08 NOTE — Progress Notes (Signed)
Site area: right groin  Site Prior to Removal:  Level 0  Pressure Applied For 20 MINUTES    Minutes Beginning at 21:10  Manual:   Yes.    Patient Status During Pull:  AXOX3  Post Pull Groin Site:  Level 0  Post Pull Instructions Given:  Yes.    Post Pull Pulses Present:  Yes.    Dressing Applied:  Yes.    Comments:  Pt tolerated sheath removal without complication, will continue to monitor patient,

## 2014-08-11 ENCOUNTER — Encounter (HOSPITAL_COMMUNITY): Payer: Self-pay | Admitting: Cardiovascular Disease

## 2014-08-11 ENCOUNTER — Telehealth: Payer: Self-pay | Admitting: Cardiovascular Disease

## 2014-08-11 ENCOUNTER — Telehealth: Payer: Self-pay | Admitting: Cardiology

## 2014-08-11 ENCOUNTER — Other Ambulatory Visit
Admission: RE | Admit: 2014-08-11 | Discharge: 2014-08-11 | Disposition: A | Discharge status: Home / Self Care | Payer: Medicare Other | Source: Ambulatory Visit | Attending: Physician Assistant | Admitting: Physician Assistant

## 2014-08-11 DIAGNOSIS — N184 Chronic kidney disease, stage 4 (severe): Secondary | ICD-10-CM | POA: Insufficient documentation

## 2014-08-11 LAB — PROTEIN ELECTROPHORESIS, SERUM
A/G Ratio: 0.9 (ref 0.7–1.7)
ALPHA-1-GLOBULIN: 0.2 g/dL (ref 0.0–0.4)
Albumin ELP: 2.5 g/dL — ABNORMAL LOW (ref 2.9–4.4)
Alpha-2-Globulin: 0.9 g/dL (ref 0.4–1.0)
Beta Globulin: 0.7 g/dL (ref 0.7–1.3)
GLOBULIN, TOTAL: 2.8 g/dL (ref 2.2–3.9)
Gamma Globulin: 1 g/dL (ref 0.4–1.8)
M-SPIKE, %: 0.8 g/dL — AB
Total Protein ELP: 5.3 g/dL — ABNORMAL LOW (ref 6.0–8.5)

## 2014-08-11 LAB — BASIC METABOLIC PANEL
Anion gap: 10 (ref 5–15)
BUN: 58 mg/dL — AB (ref 6–20)
CHLORIDE: 105 mmol/L (ref 101–111)
CO2: 24 mmol/L (ref 22–32)
Calcium: 7.9 mg/dL — ABNORMAL LOW (ref 8.9–10.3)
Creatinine, Ser: 3.91 mg/dL — ABNORMAL HIGH (ref 0.61–1.24)
GFR calc non Af Amer: 15 mL/min — ABNORMAL LOW (ref >60)
GFR, EST AFRICAN AMERICAN: 17 mL/min — AB (ref >60)
Glucose, Bld: 178 mg/dL — ABNORMAL HIGH (ref 65–99)
POTASSIUM: 4.1 mmol/L (ref 3.5–5.1)
Sodium: 139 mmol/L (ref 135–145)

## 2014-08-11 LAB — ANTINUCLEAR ANTIBODIES, IFA: ANA Ab, IFA: NEGATIVE

## 2014-08-11 LAB — MPO/PR-3 (ANCA) ANTIBODIES: ANCA Proteinase 3: <3.5 U/mL (ref 0.0–3.5)

## 2014-08-11 NOTE — Telephone Encounter (Signed)
Patient contacted regarding discharge from [facility] on 08/08/14.  Patient understands to follow up with provider WOUND CHECK NURSE on 08/18/14 at 11 AM at [place].  AND  Dr Ellyn Hack  ON 10/14/14 2 PM  AT [facility] OFFICE Patient understands discharge instructions? yes  Patient understands medications and regiment? yes  Patient understands to bring all medications to this visit? yes

## 2014-08-11 NOTE — Telephone Encounter (Signed)
Mr. Solan needs a D/C phone call .[name] Appts on 08/18/14 for a wound check had a cath on 08/07/14 and 10/19/14 for Dr. Ellyn Hack .[name] Please call   Thanks

## 2014-08-11 NOTE — Telephone Encounter (Signed)
Spoke with lab staff at [facility] - patient had lab work and told staff he was to have urine test - none ordered per hospital discharge.   Below is follow up note from hospital discharge.  Please follow up.    Why: Please obtain Basic Metabolic Panel at [place] next Tue 7/5 and has result sent to Dr. Allison Quarry office

## 2014-08-12 MED FILL — Heparin Sodium (Porcine) 2 Unit/ML in Sodium Chloride 0.9%: INTRAMUSCULAR | Qty: 1000 | Status: AC

## 2014-08-13 ENCOUNTER — Other Ambulatory Visit: Payer: Self-pay

## 2014-08-13 DIAGNOSIS — Z9861 Coronary angioplasty status: Secondary | ICD-10-CM

## 2014-08-18 ENCOUNTER — Encounter: Payer: Self-pay | Admitting: Physician Assistant

## 2014-08-18 ENCOUNTER — Ambulatory Visit: Payer: Medicare Other

## 2014-08-18 ENCOUNTER — Ambulatory Visit (INDEPENDENT_AMBULATORY_CARE_PROVIDER_SITE_OTHER): Payer: Medicare Other | Admitting: Physician Assistant

## 2014-08-18 ENCOUNTER — Telehealth: Payer: Self-pay | Admitting: Cardiology

## 2014-08-18 VITALS — BP 150/56 | HR 56 | Ht 70.0 in | Wt 217.6 lb

## 2014-08-18 DIAGNOSIS — I255 Ischemic cardiomyopathy: Secondary | ICD-10-CM | POA: Diagnosis not present

## 2014-08-18 DIAGNOSIS — I5022 Chronic systolic (congestive) heart failure: Secondary | ICD-10-CM

## 2014-08-18 DIAGNOSIS — I2510 Atherosclerotic heart disease of native coronary artery without angina pectoris: Secondary | ICD-10-CM | POA: Diagnosis not present

## 2014-08-18 DIAGNOSIS — N184 Chronic kidney disease, stage 4 (severe): Secondary | ICD-10-CM

## 2014-08-18 DIAGNOSIS — D696 Thrombocytopenia, unspecified: Secondary | ICD-10-CM

## 2014-08-18 DIAGNOSIS — R002 Palpitations: Secondary | ICD-10-CM

## 2014-08-18 MED ORDER — CLOPIDOGREL BISULFATE 75 MG PO TABS: 75.0000 mg | ORAL_TABLET | Freq: Every day | ORAL | Status: DC

## 2014-08-18 NOTE — Addendum Note (Signed)
Addended by: Michae Kava on: 08/18/2014 01:06 PM   Modules accepted: Orders

## 2014-08-18 NOTE — Patient Instructions (Addendum)
Medication Instructions:  1. INCREASE HYDRALAZINE TO 50 MG THREE TIMES DAILY; NEW RX SENT IN  Labwork: 08/20/14; BMET, CBC W./DIFF; WITH NEPHROLOGY APPT. ; RX GIVEN TO PT TODAY Testing/Procedures: Your physician has recommended that you wear a 48 HOUR holter monitor.PT WOULD LIKE TO HAVE THIS PUT ON IN THE [place] OFFICE IF POSSIBLE.  Holter monitors are medical devices that record the heart's electrical activity. Doctors most often use these monitors to diagnose arrhythmias. Arrhythmias are problems with the speed or rhythm of the heartbeat. The monitor is a small, portable device. You can wear one while you do your normal daily activities. This is usually used to diagnose what is causing palpitations/syncope (passing out).   Follow-Up: KEEP YOUR APPR WITH DR. HARDING IN [place] 10/14/14  Any Other Special Instructions Will Be Listed Below (If Applicable).

## 2014-08-18 NOTE — Telephone Encounter (Signed)
Daughter, April calling to get fax # to send records from prior cardiologist and also to check on lab results.  Per Daughter patient has appt at [facility] office today.  Patient does not have DPR on file to release info to daughter.  She is notified that patient can get these lab results from care provider when he is seen at the [facility] office this morning.  Patient with bring POA forms and sighn DPR upon upcoming visit.

## 2014-08-18 NOTE — Progress Notes (Signed)
Cardiology Office Note   Date:  08/18/2014   ID:  Joseph [name], DOB 1945/09/26, MRN [id number]  Patient Care Team: Marden Noble, MD as PCP - General (Internal Medicine) Lavonia Dana, MD as Consulting Physician (Nephrology) Leonie Man, MD as Consulting Physician (Cardiology)    Chief Complaint  Patient presents with  . Hospitalization Follow-up    s/p NSTEMI >> complex PCI fo LM/LCx  . Coronary Artery Disease     History of Present Illness: Joseph [name] is a 69 y.o. male with a hx of CAD status post CABG in 2001 at [place] (LIMA-LAD, SVG-D2, SVG-OM1, SVG-right PDA, SVG-right PL1) and prior PCI, CKD stage IV (previously treated with dialysis), chronic thrombocytopenia, prostate CA. Patient underwent cardiac catheterization with Dr. Humphrey Rolls in [facility] 6/23. This demonstrated all vein grafts occluded. LIMA to the LAD was patent. There was a 70% ostial left main lesion, 70% ostial LCx and an occluded RCA. The patient's case was discussed with Dr. Terrence Dupont for consideration of PCI. However, the patient was admitted 6/29-7/2 with a non-STEMI. He presented to the hospital with worsening angina at rest and associated dyspnea. He was in the process of transitioning care to Dr. Ellyn Hack. Therefore, [facility] took over his care. Echocardiogram demonstrated severely reduced LV function with an EF of 25-30%, multiple wall motion abnormalities, moderate diastolic dysfunction and moderately elevated pulmonary pressures with a PASP 49 mmHg and moderately reduced RV function. He was seen by nephrology who recommended IV fluids and minimal contrast dye. The patient underwent cardiac catheterization with Dr. Gwenlyn Found 08/07/14. He was noted to have Critical left main disease and ostial left circumflex disease. This was treated with a Synergy DES. Outpatient follow-up with nephrology was recommended. Creatinine remained stable post catheterization. He is due to establish with Dr. Ellyn Hack in [facility]  10/14/14.    He returns for FU.  He is here alone. Since DC, he has been doing well. He denies further chest discomfort. Breathing is stable. He is overall NYHA 2-2b. He denies orthopnea or PND. He has chronic LE edema without change. He takes torsemide as needed for significant weight gain. He had follow-up labs last week. Creatinine was slightly increased. He has not had follow-up CBC to recheck his platelets. He sees his nephrologist later this week. He is scheduled to have his first visit with cardiac rehabilitation at [facility] later this week. He has had some leg cramps. He went to the fire station last week for dizziness/lightheadedness. Blood pressure was 315 systolic. EMT at the fire station told him that his heartbeat was regular. His symptoms resolved and he's not had any further episodes.   Studies/Reports Reviewed Today:  LHC 08/07/14 LM:  90% LAD: Ostial to proximal 100% LCx: Ostial 90% RCA: Ostial to mid 100% PCI: 2.5 x 18 mm Synergy DES to the distal left main/ostial LCx  Echo 08/06/14 EF 25-30%, inferior akinesis and scarring, anterolateral HK, apical anteroseptal, anterior, inferoseptal and apical HK Grade 2 diastolic dysfunction Severe LAE Mild RVE, moderately reduced RVSF Mild RAE PASP 49 mmHg  LHC 07/30/14 LM: Ostial 70% LCx: Ostial to proximal 70% RCA: 100% proximal with left-to-right collaterals LIMA-LAD okay SVGs occluded  Echo 08/2013 at [facility] EF 40-45%   Past Medical History  Diagnosis Date  . Hypertension   . Hypercholesterolemia   . Prostate cancer   . Renal disorder     Dr. Juleen China  . Enlarged liver   . Spleen enlarged   . Thrombocytopenia   . Myocardial  infarction   . Anxiety   . Depression   . Shortness of breath   . Diabetes mellitus without complication     INSULIN DEPENDENT  . GERD (gastroesophageal reflux disease)   . History of shingles   . CHF (congestive heart failure)   . Dialysis patient     currently on hold- Nov 2015  . CAD  (coronary artery disease)     s/p CABG 2001, cath 07/30/2014 vein grafts all down, patent LIMA to LAD, significant LM and ost LCx dx. Cath 08/07/2014 Synergy DES 2.5 mm x 18 mm to dist LM and ost LCx    Past Surgical History  Procedure Laterality Date  . Cardiac surgery    . Shoulder arthroscopy    . Penile prosthesis implant    . Myocardial infarction    . Cardiac catheterization    . Coronary angioplasty    . Coronary artery bypass graft  2001  . Colonoscopy    . Liver biopsy    . Av fistula placement Left 2015  . Cardiac catheterization N/A 07/30/2014    Procedure: Left Heart Cath and Cors/Grafts Angiography;  Surgeon: Dionisio David, MD;  Location: [place] [facility];  Service: Cardiovascular;  Laterality: N/A;  . Cardiac catheterization N/A 08/07/2014    Procedure: Coronary Stent Intervention;  Surgeon: Lorretta Harp, MD;  Location: [place] [facility];  Service: Cardiovascular;  Laterality: N/A;     Current Outpatient Prescriptions  Medication Sig Dispense Refill  . ALPRAZolam (XANAX) 0.5 MG tablet Take 0.5 mg by mouth at bedtime as needed for anxiety or sleep.     [name] aspirin 81 MG tablet Take 81 mg by mouth daily.    [name] BEE POLLEN PO Take by mouth daily. 1 tsp    . calcitRIOL (ROCALTROL) 0.25 MCG capsule Take 0.25 mcg by mouth 3 (three) times a week. On Monday, Wednesday, and Friday    . carvedilol (COREG) 12.5 MG tablet Take 0.5 tablets (6.25 mg total) by mouth 2 (two) times daily with a meal. 60 tablet 2  . clopidogrel (PLAVIX) 75 MG tablet Take 1 tablet (75 mg total) by mouth daily. 90 tablet 3  . hydrALAZINE (APRESOLINE) 50 MG tablet Take 50 mg by mouth 3 (three) times daily.    . isosorbide mononitrate (IMDUR) 30 MG 24 hr tablet Take 30 mg by mouth daily.    [name] LANTUS SOLOSTAR 100 UNIT/ML Solostar Pen Inject 20 Units into the skin at bedtime.     [name] LYRICA 75 MG capsule Take 75 mg by mouth 2 (two) times daily.   0  . nitroGLYCERIN (NITROLINGUAL) 0.4 MG/SPRAY spray Place 1  spray under the tongue every 5 (five) minutes x 3 doses as needed for chest pain. Do not use together with sublingual nitro 12 g 5  . nitroGLYCERIN (NITROSTAT) 0.4 MG SL tablet Place 1 tablet (0.4 mg total) under the tongue every 5 (five) minutes as needed for chest pain. 25 tablet 3  . Omega-3 Fatty Acids (FISH OIL) 1200 MG CAPS Take 1 capsule by mouth daily.    . rosuvastatin (CRESTOR) 20 MG tablet Take 20 mg by mouth daily.    . sodium bicarbonate 650 MG tablet Take 650 mg by mouth 2 (two) times daily.     . sodium polystyrene (KAYEXALATE) 15 GM/60ML suspension Take 15 g by mouth 3 (three) times a week. Mon / Wed / Fri    . Spirulina 500 MG TABS Take 1 tablet by mouth  daily.    . torsemide (DEMADEX) 20 MG tablet Take 40 mg by mouth daily as needed (edema).      No current facility-administered medications for this visit.    Allergies:   Sulfa antibiotics    Social History:  The patient  reports that he quit smoking about 6 years ago. He has quit using smokeless tobacco. He reports that he does not drink alcohol or use illicit drugs.   Family History:  The patient's family history includes Acute myelogenous leukemia in his brother; CAD in his brother; Diabetes Mellitus II in his brother.    ROS:   Please see the history of present illness.   Review of Systems  Cardiovascular: Positive for irregular heartbeat.  All other systems reviewed and are negative.     PHYSICAL EXAM: VS:  BP 150/56 mmHg  Pulse 56  Ht 5\' 10"  (1.778 m)  Wt 217 lb 9.6 oz (98.703 kg)  BMI 31.22 kg/m2    Wt Readings from Last 3 Encounters:  08/18/14 217 lb 9.6 oz (98.703 kg)  08/08/14 225 lb 1.4 oz (102.1 kg)  07/31/14 225 lb 5 oz (102.2 kg)     GEN: Well nourished, well developed, in no acute distress HEENT: normal Neck: no JVD, no masses Cardiac:  Normal S1/S2, RRR; no murmur ,  no rubs or gallops, tight 1+ bilateral LE edema; R groin without hematoma or bruit   Respiratory:  clear to auscultation  bilaterally, no wheezing, rhonchi or rales. GI: soft, nontender, nondistended, + BS MS: no deformity or atrophy Skin: warm and dry  Neuro:  CNs II-XII intact, Strength and sensation are intact Psych: Normal affect   EKG:  EKG is ordered today.  It demonstrates:   Sinus bradycardia, HR 56, inferior Q waves, normal axis, T-wave inversions in 1, aVL, V2   Recent Labs: 08/05/2014: B Natriuretic Peptide 1301.3* 08/06/2014: ALT 13* 08/08/2014: Hemoglobin 9.4*; Platelets 82* 08/11/2014: BUN 58*; Creatinine, Ser 3.91*; Potassium 4.1; Sodium 139    Lipid Panel No results found for: CHOL, TRIG, HDL, CHOLHDL, VLDL, LDLCALC, LDLDIRECT    ASSESSMENT AND PLAN:  1.  Coronary artery disease involving native coronary artery of native heart without angina pectoris:  No further angina after recent NSTEMI treated with complex PCI with DES to the distal LM and ostial LCx.  He is currently on dual antiplatelet Rx with ASA and Plavix.  We discussed the importance of dual antiplatelet therapy. Continue beta-blocker, nitrates, statin.  He is not on ACE inhibitor or angiotensin receptor blocker due to advanced CKD.  He is due to start Cardiac Rehab in [place] soon.  He will FU with Dr. Ellyn Hack as planned in September.  2.  Ischemic cardiomyopathy:  EF 25-30% by echocardiogram during recent hospitalization. He will need a follow-up echo 90 days post PCI. This can be scheduled at his follow-up with Dr. Ellyn Hack in September. Continue beta blocker, hydralazine, nitrates. He is not a candidate for ACE inhibitor or ARB secondary to advanced chronic kidney disease. If his EF remains less than 35%, consider referral to EP for ICD. 3.  Chronic systolic CHF (congestive heart failure):  He is NYHA 2-2b. He takes torsemide as needed. 4.  CKD (chronic kidney disease), stage 4 (severe):  Repeat BMET today. Follow-up with nephrology later this week as planned. 5.  Thrombocytopenia - Plan: CBC w/Diff 6.  Palpitations:  Etiology not  clear. With his ischemic cardiomyopathy, I'm certainly concerned about NSVT.  I will arrange a 48-hour Holter. If  this demonstrates NSVT, consider placement of LifeVest until EF can be reassessed 90 days post PCI.    Medication Changes: Current medicines are reviewed at length with the patient today.  Concerns regarding medicines are as outlined above.  The following changes have been made:   Discontinued Medications   CRESTOR 10 MG TABLET    Take 20 mg by mouth daily.    VITAMIN D, ERGOCALCIFEROL, (DRISDOL) 50000 UNITS CAPS CAPSULE    Take 50,000 Units by mouth every 30 (thirty) days.   Modified Medications   Modified Medication Previous Medication   CLOPIDOGREL (PLAVIX) 75 MG TABLET clopidogrel (PLAVIX) 75 MG tablet      Take 1 tablet (75 mg total) by mouth daily.    Take 1 tablet (75 mg total) by mouth daily.   New Prescriptions   No medications on file    Labs/ tests ordered today include:   Orders Placed This Encounter  Procedures  . Basic Metabolic Panel (BMET)  . CBC w/Diff  . Holter monitor - 48 hour  . EKG 12-Lead     Disposition:   FU with Dr. Glenetta Hew 10/2014 as planned.    Signed, Versie Starks, MHS 08/18/2014 12:42 PM    [facility] [address], [place], [place]  [address] Phone: [phone]; Fax: [phone]

## 2014-08-19 ENCOUNTER — Ambulatory Visit (INDEPENDENT_AMBULATORY_CARE_PROVIDER_SITE_OTHER): Payer: Medicare Other

## 2014-08-19 DIAGNOSIS — R002 Palpitations: Secondary | ICD-10-CM

## 2014-08-19 DIAGNOSIS — I255 Ischemic cardiomyopathy: Secondary | ICD-10-CM | POA: Diagnosis not present

## 2014-08-24 ENCOUNTER — Encounter: Payer: Medicare Other | Attending: Cardiology | Admitting: *Deleted

## 2014-08-24 VITALS — Ht 70.0 in | Wt 223.0 lb

## 2014-08-24 DIAGNOSIS — I214 Non-ST elevation (NSTEMI) myocardial infarction: Secondary | ICD-10-CM | POA: Diagnosis present

## 2014-08-24 DIAGNOSIS — Z9861 Coronary angioplasty status: Secondary | ICD-10-CM | POA: Diagnosis present

## 2014-08-25 NOTE — Progress Notes (Signed)
Cardiac Individual Treatment Plan  Patient Details  Name: Joseph [name] MRN: [id number] Date of Birth: [birth year] Referring Provider:  Leonie Man, MD  Initial Encounter Date: Date: 08/24/14  Visit Diagnosis: NSTEMI (non-ST elevated myocardial infarction)  S/P PTCA (percutaneous transluminal coronary angioplasty)  Patient's Home Medications on Admission:  Current outpatient prescriptions:  .  ALPRAZolam (XANAX) 0.5 MG tablet, Take 0.5 mg by mouth at bedtime as needed for anxiety or sleep. , Disp: , Rfl:  .  aspirin 81 MG tablet, Take 81 mg by mouth daily., Disp: , Rfl:  .  BEE POLLEN PO, Take by mouth daily. 1 tsp, Disp: , Rfl:  .  calcitRIOL (ROCALTROL) 0.25 MCG capsule, Take 0.25 mcg by mouth 3 (three) times a week. On Monday, Wednesday, and Friday, Disp: , Rfl:  .  carvedilol (COREG) 12.5 MG tablet, Take 0.5 tablets (6.25 mg total) by mouth 2 (two) times daily with a meal., Disp: 60 tablet, Rfl: 2 .  clopidogrel (PLAVIX) 75 MG tablet, Take 1 tablet (75 mg total) by mouth daily., Disp: 90 tablet, Rfl: 3 .  hydrALAZINE (APRESOLINE) 50 MG tablet, Take 50 mg by mouth 3 (three) times daily., Disp: , Rfl:  .  isosorbide mononitrate (IMDUR) 30 MG 24 hr tablet, Take 30 mg by mouth daily., Disp: , Rfl:  .  LANTUS SOLOSTAR 100 UNIT/ML Solostar Pen, Inject 20 Units into the skin at bedtime. , Disp: , Rfl:  .  LYRICA 75 MG capsule, Take 75 mg by mouth 2 (two) times daily. , Disp: , Rfl: 0 .  nitroGLYCERIN (NITROLINGUAL) 0.4 MG/SPRAY spray, Place 1 spray under the tongue every 5 (five) minutes x 3 doses as needed for chest pain. Do not use together with sublingual nitro, Disp: 12 g, Rfl: 5 .  nitroGLYCERIN (NITROSTAT) 0.4 MG SL tablet, Place 1 tablet (0.4 mg total) under the tongue every 5 (five) minutes as needed for chest pain., Disp: 25 tablet, Rfl: 3 .  Omega-3 Fatty Acids (FISH OIL) 1200 MG CAPS, Take 1 capsule by mouth daily., Disp: , Rfl:  .  rosuvastatin (CRESTOR) 20 MG tablet, Take  20 mg by mouth daily., Disp: , Rfl:  .  sodium bicarbonate 650 MG tablet, Take 650 mg by mouth 2 (two) times daily. , Disp: , Rfl:  .  sodium polystyrene (KAYEXALATE) 15 GM/60ML suspension, Take 15 g by mouth 3 (three) times a week. Mon / Wed / Fri, Disp: , Rfl:  .  Spirulina 500 MG TABS, Take 1 tablet by mouth daily., Disp: , Rfl:  .  torsemide (DEMADEX) 20 MG tablet, Take 40 mg by mouth daily as needed (edema). , Disp: , Rfl:   Past Medical History: Past Medical History  Diagnosis Date  . Hypertension   . Hypercholesterolemia   . Prostate cancer   . Renal disorder     Dr. Juleen China  . Enlarged liver   . Spleen enlarged   . Thrombocytopenia   . Myocardial infarction   . Anxiety   . Depression   . Shortness of breath   . Diabetes mellitus without complication     INSULIN DEPENDENT  . GERD (gastroesophageal reflux disease)   . History of shingles   . CHF (congestive heart failure)   . Dialysis patient     currently on hold- Nov 2015  . CAD (coronary artery disease)     s/p CABG 2001, cath 07/30/2014 vein grafts all down, patent LIMA to LAD, significant LM and ost LCx dx. Cath  08/07/2014 Synergy DES 2.5 mm x 18 mm to dist LM and ost LCx    Tobacco Use: History  Smoking status  . Former Smoker  . Quit date: 02/07/2008  Smokeless tobacco  . 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       Exercise Target Goals: Date: 08/24/14  Exercise Program Goal: Individual exercise prescription set with THRR, safety & activity barriers. Participant demonstrates ability to understand and report RPE using BORG scale, to self-measure pulse accurately, and to acknowledge the importance of the exercise prescription.  Exercise Prescription Goal: Starting with aerobic activity 30 plus minutes a day, 3 days per week for initial exercise prescription. Provide home exercise prescription and guidelines that participant acknowledges understanding  prior to discharge.  Activity Barriers & Risk Stratification:     Activity Barriers & Risk Stratification - 08/24/14 1337    Activity Barriers & Risk Stratification   Activity Barriers None   Risk Stratification High      6 Minute Walk:     6 Minute Walk      08/24/14 1448       6 Minute Walk   Phase Initial     Distance 1265 feet     Walk Time 6 minutes     Resting HR 59 bpm     Resting BP 130/70 mmHg     Max Ex. HR 100 bpm     Max Ex. BP 160/78 mmHg     RPE 11     Symptoms Yes (comment)     Comments Patient had chest pain at pain level of 2 during walk. He informed me of this pain after the walk was over and said that pain was relieved once he stopped walking.         Initial Exercise Prescription:     Initial Exercise Prescription - 08/24/14 1400    Date of Initial Exercise Prescription   Date 08/24/14   Treadmill   MPH 2.3   Grade 0   Minutes 15   Bike   Level 0.8   Minutes 15   Recumbant Bike   Level 2   RPM 40   Watts 20   Minutes 15   NuStep   Level 3   Watts 30   Minutes 15   Arm Ergometer   Level 1   Watts 10   Minutes 15   Arm/Foot Ergometer   Level 1   Watts 12   Minutes 15   Cybex   Level 1   RPM 40   Minutes 15   Recumbant Elliptical   Level 1   RPM 40   Watts 20   Minutes 15   Elliptical   Level 1   Speed 3   Minutes 5   REL-XR   Level 3   Watts 30   Minutes 15   Prescription Details   Frequency (times per week) 3   Duration Progress to 30 minutes of continuous aerobic without signs/symptoms of physical distress   Intensity   THRR REST +  30   Ratings of Perceived Exertion 11-15   Progression Continue progressive overload as per policy without signs/symptoms or physical distress.   Resistance Training   Training Prescription Yes   Weight 2   Reps 10-12      Exercise Prescription Changes:   Discharge Exercise Prescription (Final Exercise Prescription Changes):   Nutrition:  Target Goals: Understanding  of  nutrition guidelines, daily intake of sodium 1500mg , cholesterol 200mg , calories 30% from fat and 7% or less from saturated fats, daily to have 5 or more servings of fruits and vegetables.  Biometrics:     Pre Biometrics - 08/24/14 1459    Pre Biometrics   Height 5\' 10"  (1.778 m)   Weight 223 lb (101.152 kg)   Waist Circumference 44.5 inches   Hip Circumference 41 inches   Waist to Hip Ratio 1.09 %   BMI (Calculated) 32.1       Nutrition Therapy Plan and Nutrition Goals:   Nutrition Discharge: Rate Your Plate Scores:   Nutrition Goals Re-Evaluation:   Psychosocial: Target Goals: Acknowledge presence or absence of depression, maximize coping skills, provide positive support system. Participant is able to verbalize types and ability to use techniques and skills needed for reducing stress and depression.  Initial Review & Psychosocial Screening:     Initial Psych Review & Screening - 08/24/14 1358    Screening Interventions   Interventions Encouraged to exercise;Program counselor consult      Quality of Life Scores:     Quality of Life - 08/25/14 0738    Quality of Life Scores   Health/Function Pre 27.86 %   Socioeconomic Pre 30 %   Psych/Spiritual Pre 30 %   Family Pre 30 %   GLOBAL Pre 29.03 %      PHQ-9:     Recent Review Flowsheet Data    Depression screen [facility] 2/9 08/24/2014   Decreased Interest 0   Down, Depressed, Hopeless 0   PHQ - 2 Score 0   Altered sleeping 0   Tired, decreased energy 0   Change in appetite 0   Feeling bad or failure about yourself  0   Trouble concentrating 0   Moving slowly or fidgety/restless 0   Suicidal thoughts 0   PHQ-9 Score 0      Psychosocial Evaluation and Intervention:   Psychosocial Re-Evaluation:   Vocational Rehabilitation: Provide vocational rehab assistance to qualifying candidates.   Vocational Rehab Evaluation & Intervention:     Vocational Rehab - 08/24/14 1338    Initial Vocational Rehab  Evaluation & Intervention   Assessment shows need for Vocational Rehabilitation No      Education: Education Goals: Education classes will be provided on a weekly basis, covering required topics. Participant will state understanding/return demonstration of topics presented.  Learning Barriers/Preferences:     Learning Barriers/Preferences - 08/24/14 1338    Learning Barriers/Preferences   Learning Barriers None   Learning Preferences None      Education Topics: General Nutrition Guidelines/Fats and Fiber: -Group instruction provided by verbal, written material, models and posters to present the general guidelines for heart healthy nutrition. Gives an explanation and review of dietary fats and fiber.   Controlling Sodium/Reading Food Labels: -Group verbal and written material supporting the discussion of sodium use in heart healthy nutrition. Review and explanation with models, verbal and written materials for utilization of the food label.   Exercise Physiology & Risk Factors: - Group verbal and written instruction with models to review the exercise physiology of the cardiovascular system and associated critical values. Details cardiovascular disease risk factors and the goals associated with each risk factor.   Aerobic Exercise & Resistance Training: - Gives group verbal and written discussion on the health impact of inactivity. On the components of aerobic and resistive training programs and the benefits of this training and how to safely progress through these  programs.   Flexibility, Balance, General Exercise Guidelines: - Provides group verbal and written instruction on the benefits of flexibility and balance training programs. Provides general exercise guidelines with specific guidelines to those with heart or lung disease. Demonstration and skill practice provided.   Stress Management: - Provides group verbal and written instruction about the health risks of elevated  stress, cause of high stress, and healthy ways to reduce stress.   Depression: - Provides group verbal and written instruction on the correlation between heart/lung disease and depressed mood, treatment options, and the stigmas associated with seeking treatment.   Anatomy & Physiology of the Heart: - Group verbal and written instruction and models provide basic cardiac anatomy and physiology, with the coronary electrical and arterial systems. Review of: AMI, Angina, Valve disease, Heart Failure, Cardiac Arrhythmia, Pacemakers, and the ICD.   Cardiac Procedures: - Group verbal and written instruction and models to describe the testing methods done to diagnose heart disease. Reviews the outcomes of the test results. Describes the treatment choices: Medical Management, Angioplasty, or Coronary Bypass Surgery.   Cardiac Medications: - Group verbal and written instruction to review commonly prescribed medications for heart disease. Reviews the medication, class of the drug, and side effects. Includes the steps to properly store meds and maintain the prescription regimen.   Go Sex-Intimacy & Heart Disease, Get SMART - Goal Setting: - Group verbal and written instruction through game format to discuss heart disease and the return to sexual intimacy. Provides group verbal and written material to discuss and apply goal setting through the application of the S.M.A.R.T. Method.   Other Matters of the Heart: - Provides group verbal, written materials and models to describe Heart Failure, Angina, Valve Disease, and Diabetes in the realm of heart disease. Includes description of the disease process and treatment options available to the cardiac patient.   Exercise & Equipment Safety: - Individual verbal instruction and demonstration of equipment use and safety with use of the equipment.          Cardiac Rehab from 08/24/2014 in [facility] Cardiac Rehab   Date  08/24/14   Educator  SB   Instruction  Review Code  2- meets goals/outcomes      Infection Prevention: - Provides verbal and written material to individual with discussion of infection control including proper hand washing and proper equipment cleaning during exercise session.      Cardiac Rehab from 08/24/2014 in [facility] Cardiac Rehab   Date  08/24/14   Educator  SB   Instruction Review Code  2- meets goals/outcomes      Falls Prevention: - Provides verbal and written material to individual with discussion of falls prevention and safety.      Cardiac Rehab from 08/24/2014 in [facility] Cardiac Rehab   Date  08/24/14   Educator  SB   Instruction Review Code  2- meets goals/outcomes      Diabetes: - Individual verbal and written instruction to review signs/symptoms of diabetes, desired ranges of glucose level fasting, after meals and with exercise. Advice that pre and post exercise glucose checks will be done for 3 sessions at entry of program.      Cardiac Rehab from 08/24/2014 in [facility] Cardiac Rehab   Date  08/24/14   Educator  SB   Instruction Review Code  2- meets goals/outcomes       Knowledge Questionnaire Score:     Knowledge Questionnaire Score - 08/25/14 0713    Knowledge Questionnaire Score   Pre Score  26/28      Personal Goals and Risk Factors at Admission:     Personal Goals and Risk Factors at Admission - 08/24/14 1348    Personal Goals and Risk Factors on Admission   Increase Aerobic Exercise and Physical Activity Yes   Intervention While in program, learn and follow the exercise prescription taught. Start at a low level workload and increase workload after able to maintain previous level for 30 minutes. Increase time before increasing intensity.   Diabetes Yes   Goal Blood glucose control identified by blood glucose values, HgbA1C. Participant verbalizes understanding of the signs/symptoms of hyper/hypo glycemia, proper foot care and importance of medication and nutrition plan for blood glucose control.    Intervention Provide nutrition & aerobic exercise along with prescribed medications to achieve blood glucose in normal ranges: Fasting 65-99 mg/dL   Hypertension Yes   Goal Participant will see blood pressure controlled within the values of 140/61mm/Hg or within value directed by their physician.   Intervention Provide nutrition & aerobic exercise along with prescribed medications to achieve BP 140/90 or less.   Lipids Yes   Goal Cholesterol controlled with medications as prescribed, with individualized exercise RX and with personalized nutrition plan. Value goals: LDL < 70mg , HDL > 40mg . Participant states understanding of desired cholesterol values and following prescriptions.   Intervention Provide nutrition & aerobic exercise along with prescribed medications to achieve LDL 70mg , HDL >40mg .      Personal Goals and Risk Factors Review:    Personal Goals Discharge:     Comments: Initial orientation completed Continue with ITP

## 2014-08-25 NOTE — Patient Instructions (Signed)
Patient Instructions  Patient Details  Name: Joseph [name] MRN: [id number] Date of Birth: 01/30/1946 Referring Provider:  Leonie Man, MD  Below are the personal goals you chose as well as exercise and nutrition goals. Our goal is to help you keep on track towards obtaining and maintaining your goals. We will be discussing your progress on these goals with you throughout the program.  Initial Exercise Prescription:     Initial Exercise Prescription - 08/24/14 1400    Date of Initial Exercise Prescription   Date 08/24/14   Treadmill   MPH 2.3   Grade 0   Minutes 15   Bike   Level 0.8   Minutes 15   Recumbant Bike   Level 2   RPM 40   Watts 20   Minutes 15   NuStep   Level 3   Watts 30   Minutes 15   Arm Ergometer   Level 1   Watts 10   Minutes 15   Arm/Foot Ergometer   Level 1   Watts 12   Minutes 15   Cybex   Level 1   RPM 40   Minutes 15   Recumbant Elliptical   Level 1   RPM 40   Watts 20   Minutes 15   Elliptical   Level 1   Speed 3   Minutes 5   REL-XR   Level 3   Watts 30   Minutes 15   Prescription Details   Frequency (times per week) 3   Duration Progress to 30 minutes of continuous aerobic without signs/symptoms of physical distress   Intensity   THRR REST +  30   Ratings of Perceived Exertion 11-15   Progression Continue progressive overload as per policy without signs/symptoms or physical distress.   Resistance Training   Training Prescription Yes   Weight 2   Reps 10-12      Exercise Goals: Frequency: Be able to perform aerobic exercise three times per week working toward 3-5 days per week.  Intensity: Work with a perceived exertion of 11 (fairly light) - 15 (hard) as tolerated. Follow your new exercise prescription and watch for changes in prescription as you progress with the program. Changes will be reviewed with you when they are made.  Duration: You should be able to do 30 minutes of continuous aerobic exercise in  addition to a 5 minute warm-up and a 5 minute cool-down routine.  Nutrition Goals: Your personal nutrition goals will be established when you do your nutrition analysis with the dietician.  The following are nutrition guidelines to follow: Cholesterol < 200mg /day Sodium < 1500mg /day Fiber: Men over 50 yrs - 30 grams per day  Personal Goals:     Personal Goals and Risk Factors at Admission - 08/24/14 1348    Personal Goals and Risk Factors on Admission   Increase Aerobic Exercise and Physical Activity Yes   Intervention While in program, learn and follow the exercise prescription taught. Start at a low level workload and increase workload after able to maintain previous level for 30 minutes. Increase time before increasing intensity.   Diabetes Yes   Goal Blood glucose control identified by blood glucose values, HgbA1C. Participant verbalizes understanding of the signs/symptoms of hyper/hypo glycemia, proper foot care and importance of medication and nutrition plan for blood glucose control.   Intervention Provide nutrition & aerobic exercise along with prescribed medications to achieve blood glucose in normal ranges: Fasting 65-99 mg/dL   Hypertension Yes  Goal Participant will see blood pressure controlled within the values of 140/28mm/Hg or within value directed by their physician.   Intervention Provide nutrition & aerobic exercise along with prescribed medications to achieve BP 140/90 or less.   Lipids Yes   Goal Cholesterol controlled with medications as prescribed, with individualized exercise RX and with personalized nutrition plan. Value goals: LDL < 70mg , HDL > 40mg . Participant states understanding of desired cholesterol values and following prescriptions.   Intervention Provide nutrition & aerobic exercise along with prescribed medications to achieve LDL 70mg , HDL >40mg .      Tobacco Use Initial Evaluation: History  Smoking status  . Former Smoker  . Quit date:  02/07/2008  Smokeless tobacco  . 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

## 2014-08-26 ENCOUNTER — Telehealth: Payer: Self-pay

## 2014-08-26 ENCOUNTER — Encounter: Payer: Medicare Other | Admitting: *Deleted

## 2014-08-26 ENCOUNTER — Other Ambulatory Visit: Payer: Self-pay

## 2014-08-26 ENCOUNTER — Other Ambulatory Visit: Payer: Self-pay | Admitting: *Deleted

## 2014-08-26 ENCOUNTER — Telehealth: Payer: Self-pay | Admitting: Cardiology

## 2014-08-26 ENCOUNTER — Observation Stay (HOSPITAL_COMMUNITY)
Admission: EM | Admit: 2014-08-26 | Discharge: 2014-08-27 | Disposition: A | Discharge status: Home / Self Care | Payer: Medicare Other | Attending: Cardiology | Admitting: Cardiology

## 2014-08-26 ENCOUNTER — Emergency Department (HOSPITAL_COMMUNITY): Payer: Medicare Other

## 2014-08-26 ENCOUNTER — Encounter (HOSPITAL_COMMUNITY): Payer: Self-pay

## 2014-08-26 ENCOUNTER — Ambulatory Visit (INDEPENDENT_AMBULATORY_CARE_PROVIDER_SITE_OTHER): Payer: Medicare Other

## 2014-08-26 VITALS — BP 160/64 | HR 65 | Wt 220.0 lb

## 2014-08-26 DIAGNOSIS — Z7982 Long term (current) use of aspirin: Secondary | ICD-10-CM | POA: Insufficient documentation

## 2014-08-26 DIAGNOSIS — R0789 Other chest pain: Secondary | ICD-10-CM | POA: Diagnosis present

## 2014-08-26 DIAGNOSIS — Z9861 Coronary angioplasty status: Secondary | ICD-10-CM

## 2014-08-26 DIAGNOSIS — F419 Anxiety disorder, unspecified: Secondary | ICD-10-CM | POA: Diagnosis not present

## 2014-08-26 DIAGNOSIS — R001 Bradycardia, unspecified: Secondary | ICD-10-CM | POA: Diagnosis not present

## 2014-08-26 DIAGNOSIS — I214 Non-ST elevation (NSTEMI) myocardial infarction: Secondary | ICD-10-CM

## 2014-08-26 DIAGNOSIS — Z992 Dependence on renal dialysis: Secondary | ICD-10-CM | POA: Insufficient documentation

## 2014-08-26 DIAGNOSIS — E780 Pure hypercholesterolemia: Secondary | ICD-10-CM | POA: Insufficient documentation

## 2014-08-26 DIAGNOSIS — F329 Major depressive disorder, single episode, unspecified: Secondary | ICD-10-CM | POA: Insufficient documentation

## 2014-08-26 DIAGNOSIS — Z79899 Other long term (current) drug therapy: Secondary | ICD-10-CM | POA: Insufficient documentation

## 2014-08-26 DIAGNOSIS — R079 Chest pain, unspecified: Secondary | ICD-10-CM | POA: Diagnosis not present

## 2014-08-26 DIAGNOSIS — Z8619 Personal history of other infectious and parasitic diseases: Secondary | ICD-10-CM | POA: Insufficient documentation

## 2014-08-26 DIAGNOSIS — I252 Old myocardial infarction: Secondary | ICD-10-CM | POA: Diagnosis not present

## 2014-08-26 DIAGNOSIS — Z87891 Personal history of nicotine dependence: Secondary | ICD-10-CM | POA: Diagnosis not present

## 2014-08-26 DIAGNOSIS — K219 Gastro-esophageal reflux disease without esophagitis: Secondary | ICD-10-CM | POA: Insufficient documentation

## 2014-08-26 DIAGNOSIS — I5023 Acute on chronic systolic (congestive) heart failure: Secondary | ICD-10-CM | POA: Diagnosis present

## 2014-08-26 DIAGNOSIS — I509 Heart failure, unspecified: Secondary | ICD-10-CM | POA: Insufficient documentation

## 2014-08-26 DIAGNOSIS — R0602 Shortness of breath: Secondary | ICD-10-CM

## 2014-08-26 DIAGNOSIS — Z951 Presence of aortocoronary bypass graft: Secondary | ICD-10-CM

## 2014-08-26 DIAGNOSIS — Z8546 Personal history of malignant neoplasm of prostate: Secondary | ICD-10-CM | POA: Diagnosis not present

## 2014-08-26 DIAGNOSIS — D696 Thrombocytopenia, unspecified: Secondary | ICD-10-CM | POA: Insufficient documentation

## 2014-08-26 DIAGNOSIS — E1129 Type 2 diabetes mellitus with other diabetic kidney complication: Secondary | ICD-10-CM | POA: Diagnosis present

## 2014-08-26 DIAGNOSIS — N289 Disorder of kidney and ureter, unspecified: Secondary | ICD-10-CM | POA: Diagnosis not present

## 2014-08-26 DIAGNOSIS — I10 Essential (primary) hypertension: Secondary | ICD-10-CM | POA: Diagnosis present

## 2014-08-26 DIAGNOSIS — Z7902 Long term (current) use of antithrombotics/antiplatelets: Secondary | ICD-10-CM | POA: Diagnosis not present

## 2014-08-26 DIAGNOSIS — I2510 Atherosclerotic heart disease of native coronary artery without angina pectoris: Secondary | ICD-10-CM | POA: Diagnosis not present

## 2014-08-26 DIAGNOSIS — I255 Ischemic cardiomyopathy: Secondary | ICD-10-CM | POA: Diagnosis present

## 2014-08-26 LAB — GLUCOSE, CAPILLARY
GLUCOSE-CAPILLARY: 170 mg/dL — AB (ref 65–99)
GLUCOSE-CAPILLARY: 69 mg/dL (ref 65–99)
Glucose-Capillary: 139 mg/dL — ABNORMAL HIGH (ref 65–99)
Glucose-Capillary: 133 mg/dL — ABNORMAL HIGH (ref 65–99)
Glucose-Capillary: 199 mg/dL — ABNORMAL HIGH (ref 65–99)

## 2014-08-26 LAB — BASIC METABOLIC PANEL
Anion gap: 9 (ref 5–15)
BUN: 61 mg/dL — ABNORMAL HIGH (ref 6–20)
CALCIUM: 8 mg/dL — AB (ref 8.9–10.3)
CHLORIDE: 111 mmol/L (ref 101–111)
CO2: 21 mmol/L — AB (ref 22–32)
Creatinine, Ser: 4.01 mg/dL — ABNORMAL HIGH (ref 0.61–1.24)
GFR, EST AFRICAN AMERICAN: 17 mL/min — AB (ref >60)
GFR, EST NON AFRICAN AMERICAN: 14 mL/min — AB (ref >60)
GLUCOSE: 132 mg/dL — AB (ref 65–99)
Potassium: 5.7 mmol/L — ABNORMAL HIGH (ref 3.5–5.1)
Sodium: 141 mmol/L (ref 135–145)

## 2014-08-26 LAB — TROPONIN I
TROPONIN I: 0.03 ng/mL (ref <0.031)
TROPONIN I: 0.04 ng/mL — AB (ref <0.031)
Troponin I: 0.03 ng/mL (ref <0.031)

## 2014-08-26 LAB — CBC
HEMATOCRIT: 28.8 % — AB (ref 39.0–52.0)
Hemoglobin: 9.2 g/dL — ABNORMAL LOW (ref 13.0–17.0)
MCH: 28.2 pg (ref 26.0–34.0)
MCHC: 31.9 g/dL (ref 30.0–36.0)
MCV: 88.3 fL (ref 78.0–100.0)
Platelets: 65 10*3/uL — ABNORMAL LOW (ref 150–400)
RBC: 3.26 MIL/uL — ABNORMAL LOW (ref 4.22–5.81)
RDW: 15.1 % (ref 11.5–15.5)
WBC: 4.3 10*3/uL (ref 4.0–10.5)

## 2014-08-26 LAB — MAGNESIUM: MAGNESIUM: 1.7 mg/dL (ref 1.7–2.4)

## 2014-08-26 LAB — D-DIMER, QUANTITATIVE (NOT AT ARMC): D-Dimer, Quant: 0.75 ug/mL-FEU — ABNORMAL HIGH (ref 0.00–0.48)

## 2014-08-26 MED ORDER — INSULIN ASPART 100 UNIT/ML ~~LOC~~ SOLN
0.0000 [IU] | Freq: Three times a day (TID) | SUBCUTANEOUS | Status: DC
  Administered 2014-08-27 (×2): 3 [IU] via SUBCUTANEOUS

## 2014-08-26 MED ORDER — CLOPIDOGREL BISULFATE 75 MG PO TABS
75.0000 mg | ORAL_TABLET | Freq: Every day | ORAL | Status: DC
  Administered 2014-08-27: 75 mg via ORAL
  Filled 2014-08-26: qty 1

## 2014-08-26 MED ORDER — NITROGLYCERIN 0.4 MG SL SUBL: 0.4000 mg | SUBLINGUAL_TABLET | SUBLINGUAL | Status: DC | PRN

## 2014-08-26 MED ORDER — ISOSORBIDE MONONITRATE ER 30 MG PO TB24: 30.0000 mg | ORAL_TABLET | Freq: Every day | ORAL | Status: DC

## 2014-08-26 MED ORDER — ONDANSETRON HCL 4 MG/2ML IJ SOLN: 4.0000 mg | Freq: Four times a day (QID) | INTRAMUSCULAR | Status: DC | PRN

## 2014-08-26 MED ORDER — HYDRALAZINE HCL 50 MG PO TABS
50.0000 mg | ORAL_TABLET | Freq: Three times a day (TID) | ORAL | Status: DC
  Administered 2014-08-26 – 2014-08-27 (×2): 50 mg via ORAL
  Filled 2014-08-26 (×2): qty 1

## 2014-08-26 MED ORDER — ALPRAZOLAM 0.5 MG PO TABS: 0.5000 mg | ORAL_TABLET | Freq: Every evening | ORAL | Status: DC | PRN

## 2014-08-26 MED ORDER — OMEGA-3-ACID ETHYL ESTERS 1 G PO CAPS
1.0000 g | ORAL_CAPSULE | Freq: Every day | ORAL | Status: DC
  Administered 2014-08-27: 1 g via ORAL
  Filled 2014-08-26: qty 1

## 2014-08-26 MED ORDER — ASPIRIN 81 MG PO CHEW: 324.0000 mg | CHEWABLE_TABLET | ORAL | Status: AC

## 2014-08-26 MED ORDER — CARVEDILOL 6.25 MG PO TABS
6.2500 mg | ORAL_TABLET | Freq: Two times a day (BID) | ORAL | Status: DC
  Administered 2014-08-26 – 2014-08-27 (×3): 6.25 mg via ORAL
  Filled 2014-08-26 (×3): qty 1

## 2014-08-26 MED ORDER — PREGABALIN 50 MG PO CAPS
75.0000 mg | ORAL_CAPSULE | Freq: Two times a day (BID) | ORAL | Status: DC
  Administered 2014-08-26 – 2014-08-27 (×2): 75 mg via ORAL
  Filled 2014-08-26 (×4): qty 1

## 2014-08-26 MED ORDER — ROSUVASTATIN CALCIUM 10 MG PO TABS
20.0000 mg | ORAL_TABLET | Freq: Every day | ORAL | Status: DC
Start: 2014-08-27 — End: 2014-08-27
  Administered 2014-08-27: 20 mg via ORAL
  Filled 2014-08-26: qty 2

## 2014-08-26 MED ORDER — SODIUM POLYSTYRENE SULFONATE 15 GM/60ML PO SUSP: 15.0000 g | ORAL | Status: DC

## 2014-08-26 MED ORDER — ACETAMINOPHEN 325 MG PO TABS: 650.0000 mg | ORAL_TABLET | ORAL | Status: DC | PRN

## 2014-08-26 MED ORDER — LORAZEPAM 1 MG PO TABS
1.0000 mg | ORAL_TABLET | Freq: Once | ORAL | Status: AC
  Administered 2014-08-26: 1 mg via ORAL
  Filled 2014-08-26: qty 1

## 2014-08-26 MED ORDER — RANOLAZINE ER 500 MG PO TB12
500.0000 mg | ORAL_TABLET | Freq: Two times a day (BID) | ORAL | Status: DC
  Administered 2014-08-26 – 2014-08-27 (×3): 500 mg via ORAL
  Filled 2014-08-26 (×4): qty 1

## 2014-08-26 MED ORDER — SODIUM BICARBONATE 650 MG PO TABS
650.0000 mg | ORAL_TABLET | Freq: Two times a day (BID) | ORAL | Status: DC
  Administered 2014-08-26 – 2014-08-27 (×2): 650 mg via ORAL
  Filled 2014-08-26 (×2): qty 1

## 2014-08-26 MED ORDER — ASPIRIN 300 MG RE SUPP: 300.0000 mg | RECTAL | Status: AC

## 2014-08-26 MED ORDER — ISOSORBIDE MONONITRATE ER 60 MG PO TB24
60.0000 mg | ORAL_TABLET | Freq: Every day | ORAL | Status: DC
  Administered 2014-08-27: 60 mg via ORAL
  Filled 2014-08-26: qty 1

## 2014-08-26 MED ORDER — SODIUM POLYSTYRENE SULFONATE 15 GM/60ML PO SUSP
30.0000 g | Freq: Once | ORAL | Status: AC
  Administered 2014-08-26: 30 g via ORAL
  Filled 2014-08-26: qty 120

## 2014-08-26 MED ORDER — ASPIRIN EC 81 MG PO TBEC
81.0000 mg | DELAYED_RELEASE_TABLET | Freq: Every day | ORAL | Status: DC
Start: 2014-08-27 — End: 2014-08-27
  Administered 2014-08-27: 81 mg via ORAL
  Filled 2014-08-26: qty 1

## 2014-08-26 MED ORDER — CALCITRIOL 0.25 MCG PO CAPS: 0.2500 ug | ORAL_CAPSULE | ORAL | Status: DC

## 2014-08-26 NOTE — ED Notes (Signed)
Cardiology at bedside.

## 2014-08-26 NOTE — H&P (Signed)
Patient ID: Joseph [name] MRN: [id number], DOB/AGE: [birth year]   Admit date: 08/26/2014   Primary Physician: Marden Noble, MD Primary Cardiologist: Dr. Berenice Primas, MD as Consulting Physician (Nephrology)  HPI:  Joseph [name] is a 69 y.o. male with a hx of CAD status post CABG in 2001 at [place] (LIMA-LAD, SVG-D2, SVG-OM1, SVG-right PDA, SVG-right PL1) and prior PCI, CKD stage IV (previously treated with dialysis), chronic thrombocytopenia, prostate CA. Patient underwent cardiac catheterization with Dr. Humphrey Rolls in [place] 6/23. This demonstrated all vein grafts occluded. LIMA to the LAD was patent. There was a 70% ostial left main lesion, 70% ostial LCx and an occluded RCA. The patient's case was discussed with Dr. Terrence Dupont for consideration of PCI.   However, the patient was admitted 6/29-7/2 with a non-STEMI. He presented to the hospital with worsening angina at rest and associated dyspnea. He was in the process of transitioning care to Dr. Ellyn Hack. Therefore, [facility] took over his care. Echocardiogram demonstrated severely reduced LV function with an EF of 25-30%, multiple wall motion abnormalities, moderate diastolic dysfunction and moderately elevated pulmonary pressures with a PASP 49 mmHg and moderately reduced RV function. He was seen by nephrology who recommended IV fluids and minimal contrast dye. The patient underwent cardiac catheterization with Dr. Gwenlyn Found 08/07/14. He was noted to have Critical left main disease and ostial left circumflex disease. This was treated with a Synergy DES. Outpatient follow-up with nephrology was recommended. Creatinine remained stable post catheterization. He is due to establish with Dr. Ellyn Hack in [facility] 10/14/14.   He was doing well on cardiac stand point of view when seen last in clinic by Richardson Dopp 08/18/14. His only complain was palpitation and placed on 48 hours holter monitor. If this demonstrates NSVT, consider placement  of LifeVest until EF can be reassessed 90 days post PCI. He was seen by nephrologist last week.   The pt went to Cardiac Rehab in [place] this morning 7/20 and walked for about 2 minutes on the treadmill and deveolped chest discomfort and SOB. The pt rested and his symptoms resolved. The pt then walked on treadmill and symptoms returned and resolved with rest. He described the pain is dull on left upper chest, which is different from prior cardiac pain. He advice to go to ER, instead he went to [address] office. At that time the pt was CP free but does have SOB and he sent to ED.   The pt said he began to notice SOB last night and actually it woke him up from sleep.Patient had similar chest pain while walking on treadmill at Cardic Rehab 08/24/14. He was able to do house chores without any difficulty. He denies palpation, dizziness, orthopnea, melena or bright red blood in stool.  Currently patient is chest pain free. No recent travel. Compliant with medications. EKG showed sinus bradycardia at rate of 47, no acute abnormality. Tele showed bradycardia at rate of high 40s to low 50s.   Problem List  Past Medical History  Diagnosis Date  . Hypertension   . Hypercholesterolemia   . Prostate cancer   . Renal disorder     Dr. Juleen China  . Enlarged liver   . Spleen enlarged   . Thrombocytopenia   . Myocardial infarction   . Anxiety   . Depression   . Shortness of breath   . Diabetes mellitus without complication     INSULIN DEPENDENT  . GERD (gastroesophageal reflux disease)   .  History of shingles   . CHF (congestive heart failure)   . Dialysis patient     currently on hold- Nov 2015  . CAD (coronary artery disease)     s/p CABG 2001, cath 07/30/2014 vein grafts all down, patent LIMA to LAD, significant LM and ost LCx dx. Cath 08/07/2014 Synergy DES 2.5 mm x 18 mm to dist LM and ost LCx    Past Surgical History  Procedure Laterality Date  . Cardiac surgery    . Shoulder  arthroscopy    . Penile prosthesis implant    . Myocardial infarction    . Cardiac catheterization    . Coronary angioplasty    . Coronary artery bypass graft  2001  . Colonoscopy    . Liver biopsy    . Av fistula placement Left 2015  . Cardiac catheterization N/A 07/30/2014    Procedure: Left Heart Cath and Cors/Grafts Angiography;  Surgeon: Dionisio David, MD;  Location: [place] [facility];  Service: Cardiovascular;  Laterality: N/A;  . Cardiac catheterization N/A 08/07/2014    Procedure: Coronary Stent Intervention;  Surgeon: Lorretta Harp, MD;  Location: [place] [facility];  Service: Cardiovascular;  Laterality: N/A;     Allergies  Allergies  Allergen Reactions  . Sulfa Antibiotics Itching    hives     Home Medications  Prior to Admission medications   Medication Sig Start Date End Date Taking? Authorizing Provider  ALPRAZolam Duanne Moron) 0.5 MG tablet Take 0.5 mg by mouth at bedtime as needed for anxiety or sleep.  03/30/13  Yes Historical Provider, MD  aspirin 81 MG tablet Take 81 mg by mouth daily.   Yes Historical Provider, MD  BEE POLLEN PO Take by mouth daily. 1 tsp   Yes Historical Provider, MD  calcitRIOL (ROCALTROL) 0.25 MCG capsule Take 0.25 mcg by mouth 3 (three) times a week. On Monday, Wednesday, and Friday   Yes Historical Provider, MD  carvedilol (COREG) 12.5 MG tablet Take 0.5 tablets (6.25 mg total) by mouth 2 (two) times daily with a meal. 08/08/14  Yes Almyra Deforest, PA  clopidogrel (PLAVIX) 75 MG tablet Take 1 tablet (75 mg total) by mouth daily. 08/18/14  Yes Scott Joylene Draft, PA-C  hydrALAZINE (APRESOLINE) 50 MG tablet Take 50 mg by mouth 3 (three) times daily. 03/20/13  Yes Historical Provider, MD  isosorbide mononitrate (IMDUR) 30 MG 24 hr tablet Take 30 mg by mouth daily.   Yes Historical Provider, MD  LANTUS SOLOSTAR 100 UNIT/ML Solostar Pen Inject 20 Units into the skin at bedtime.    Yes Historical Provider, MD  LYRICA 75 MG capsule Take 75 mg by mouth 2 (two)  times daily.    Yes Historical Provider, MD  nitroGLYCERIN (NITROLINGUAL) 0.4 MG/SPRAY spray Place 1 spray under the tongue every 5 (five) minutes x 3 doses as needed for chest pain. Do not use together with sublingual nitro 08/08/14  Yes Almyra Deforest, PA  nitroGLYCERIN (NITROSTAT) 0.4 MG SL tablet Place 1 tablet (0.4 mg total) under the tongue every 5 (five) minutes as needed for chest pain. 08/08/14  Yes Almyra Deforest, PA  Omega-3 Fatty Acids (FISH OIL) 1200 MG CAPS Take 1 capsule by mouth daily.   Yes Historical Provider, MD  RANEXA 500 MG 12 hr tablet Take 500 mg by mouth 2 (two) times daily. 07/31/14  Yes Historical Provider, MD  rosuvastatin (CRESTOR) 20 MG tablet Take 20 mg by mouth daily.   Yes Historical Provider, MD  sodium  bicarbonate 650 MG tablet Take 650 mg by mouth 2 (two) times daily.    Yes Historical Provider, MD  sodium polystyrene (KAYEXALATE) 15 GM/60ML suspension Take 15 g by mouth 3 (three) times a week. Mon / Wed / Fri   Yes Historical Provider, MD  Spirulina 500 MG TABS Take 1 tablet by mouth daily.   Yes Historical Provider, MD  torsemide (DEMADEX) 20 MG tablet Take 40 mg by mouth daily as needed (edema).    Yes Historical Provider, MD    Family History  Family History  Problem Relation Age of Onset  . Acute myelogenous leukemia Brother   . CAD Brother   . Diabetes Mellitus II Brother    No family status information on file.     Social History  History   Social History  . Marital Status: Legally Separated    Spouse Name: N/A  . Number of Children: N/A  . Years of Education: N/A   Occupational History  . Not on file.   Social History Main Topics  . Smoking status: Former Smoker    Quit date: 02/07/2008  . Smokeless tobacco: Former Systems developer  . Alcohol Use: No  . Drug Use: No  . Sexual Activity: Not on file   Other Topics Concern  . Not on file   Social History Narrative      All other systems reviewed and are otherwise negative except as noted  above.  Physical Exam  Blood pressure 130/96, pulse 49, temperature 97.8 F (36.6 C), temperature source Oral, resp. rate 20, height 5\' 10"  (1.778 m), weight 220 lb (99.791 kg), SpO2 98 %.  General: Pleasant, NAD Psych: Normal affect. Neuro: Alert and oriented X 3. Moves all extremities spontaneously. HEENT: Normal  Neck: Supple without bruits or JVD. Lungs:  Resp regular and unlabored, CTA. Heart: RRR no s3, s4. Or murmurs. Abdomen: Soft, non-tender, non-distended, BS + x 4.  Extremities: No clubbing, cyanosis. 1+ bilateral LE edema. DP/PT/Radials 2+ and equal bilaterally.  Labs   Recent Labs  08/26/14 1136  TROPONINI 0.03   Lab Results  Component Value Date   WBC 4.3 08/26/2014   HGB 9.2* 08/26/2014   HCT 28.8* 08/26/2014   MCV 88.3 08/26/2014   PLT 65* 08/26/2014    Recent Labs Lab 08/26/14 1136  NA 141  K 5.7*  CL 111  CO2 21*  BUN 61*  CREATININE 4.01*  CALCIUM 8.0*  GLUCOSE 132*   Lab Results  Component Value Date   CHOL 97 04/25/2013   HDL 33* 04/25/2013   LDLCALC 41 04/25/2013   TRIG 113 04/25/2013   No results found for: DDIMER   Radiology/Studies  Dg Chest 2 View  08/26/2014   CLINICAL DATA:  69 year old male with chest pain and shortness of Breath. Initial encounter. Dialysis, diabetes, hypertension.  EXAM: CHEST  2 VIEW  COMPARISON:  08/05/2014 and earlier.  FINDINGS: Stable cardiomegaly and mediastinal contours. Stable sequelae of CABG. Interval decreased pulmonary vascularity. No pneumothorax. No definite pleural effusion. No confluent pulmonary opacity. Stable lung volumes. No acute osseous abnormality identified.  IMPRESSION: Resolved vascular congestion since June. No acute cardiopulmonary abnormality.   Electronically Signed   By: Genevie Ann M.D.   On: 08/26/2014 13:10   Dg Chest 2 View  08/05/2014   CLINICAL DATA:  Chest pain and dyspnea for 1 week.  EXAM: CHEST  2 VIEW  COMPARISON:  11/30/2013  FINDINGS: There is mild unchanged cardiomegaly.  There is prior sternotomy and CABG. The  lungs are clear. The pulmonary vasculature is normal. There are no pleural effusions. Hilar and mediastinal contours are unremarkable and unchanged.  IMPRESSION: Mild cardiomegaly.  No acute cardiopulmonary findings.   Electronically Signed   By: Andreas Newport M.D.   On: 08/05/2014 21:32    ECG  Sinus bradycardia at rate of 47.  ASSESSMENT AND PLAN Active Problems:   * No active hospital problems. *   1. Atypical chest pain - Exertional left sided chest pain, describes as dull and states "not like my heart attack", that associated with SOB. No acute change in EKG.  POC trop is negative. CXR without acute abnormality. K of 5.7. Creatinine 4.01 (was 3.52- 7/2-->3.91-7/5), BUN 61. Hemoglobin of 9.2 (stable). Platelets 65 (was 82 on 7/2, 71 on 7/1). - Will admit to telemetry, cycle trop and serial troponin  2. Coronary artery disease involving native coronary artery of native heart without angina pectoris: s/p CABG 2001 - Most recent cardiac catheterization on 08/07/2014, the distal left main stenosis and ostial left circumflex stenosis was treated with Synergy DES 2.5 mm x 18 mm with 0% residual. - Continue ASA, Plavix, coreg, imdur, renexa  3. Ischemic cardiomyopathy - EF 25-30% by echocardiogram during recent hospitalization. Plan for follow-up echo 90 days post PCI. If his EF remains less than 35%, consider referral to EP for ICD. He is not a candidate for ACE inhibitor or ARB secondary to advanced chronic kidney disease.  4.  Chronic systolic CHF - He is NYHA 2-2b. He takes torsemide as needed. He has chronic leg edema. CXR clear.  - Will place on strict I/O and daily weight.   5. CKD, stage 4 - per patient seen by nephrologist Friday 7/15 and everything was ok. - K of 5.7. Creatinine 4.01 (was 3.52- 7/2-->3.91-7/5), BUN 61. Hemoglobin of 9.2 (stable).  - Continue home medications.  - Daily BMET  6. Palpitation - etiology, placed on holter  monitor. (see last office visit note 08/18/14) - Final result pending. Reviewing report noted PAT, PACs. No significant arrhthymias.   7. Hyperkalemia - K of 5.7 - He is on Kayexalete 15mg /69ml suspension MWF. Resume home dose. However today will give total of Kayexalete 30mg /42ml.  Cornelius Moras home dose of sodium bicarbonate 650mg   8. Thrombocytopenia - Hemoglobin of 9.2 (stable). Platelets 65 (was 82 on 7/2, 71 on 7/1). - Will get daily CBC.  9. DM - SSI  10. Sinus Bradycardia - Continue to monitor on tele. - Continue home dose of coreg 6.25mg  BID for now   Signed, Bhagat,Bhavinkumar, PA-C 08/26/2014, 1:18 PM Pager [phone] As above, patient seen and examined. Briefly he is a 68 year old male with a past medical history of coronary artery disease status post prior coronary artery bypass graft and recent PCI, chronic stage IV renal failure, chronic thrombocytopenia, prostate cancer with chest pain. The patient had PCI of his left main and circumflex on July 1. Since that time he has noticed some increased dyspnea on exertion. No orthopnea or PND but he has had chronic pedal edema. Monday at cardiac rehabilitation while exercising he developed dyspnea and pain in the right chest area. It was described as a dull sensation and resolved after stopping. It is unlike his previous cardiac pain. He again had pain with ambulation at cardiac rehabilitation today. Presently pain free. Electrocardiogram shows sinus rhythm, PACs, inferior infarct and lateral T-wave inversion. Symptoms are somewhat vague and unlike his previous cardiac pain. Will admit and cycle enzymes. Check d-dimer and if elevated  proceed with VQ scan. Advance imdur to 60 mg daily. If enzymes negative would ambulate tomorrow morning and if pain-free continue medical therapy with close follow-up with Dr. Ellyn Hack. Continue beta blocker, hydralazine/nitrates for ischemic cardiomyopathy. Continue aspirin, Plavix and statin. He will need repeat  echocardiogram 3 months after recent revascularization. If ejection fraction less than 35% would need to consider ICD. We'll give Kayexalate for hyperkalemia. He will need close follow-up with nephrology after discharge.  Kirk Ruths

## 2014-08-26 NOTE — Progress Notes (Signed)
Pt walked into the office with complaints of CP and SOB.  The pt went to Cardiac Rehab in [place] this morning and walked for 15 minutes on the treadmill and deveolped CP and SOB.  The pt rested and his symptoms resolved.  The pt then walked an additional 30 minutes on treadmill and symptoms returned. The nurse at Cardiac Rehab spoke with the [facility] office and [facility] office and they advised that the pt proceed to the ER. The pt did not go to the ER and instead came into the [facility].  At this time the pt is CP free but does have SOB.  The pt said he began to notice SOB last night.  EKG and vitals performed.  Discussed with Dr Lovena Le and he recommended that the pt be seen by Dr Ellyn Hack at [facility] today since CP has resolved and no new changes on EKG.  I contacted the [facility] office and they would like the pt to go to the ER. Discussed with pt and he will proceed to the [facility] ER at this time.  Pt did sign AMA form that he refuses EMS transportation. The pt has not used NTG for symptoms.

## 2014-08-26 NOTE — Progress Notes (Signed)
Daily Session Note  Patient Details  Name: Joseph [name] MRN: [id number] Date of Birth: 08/26/1945 Referring Provider:  Harding, David W, MD  Encounter Date: 08/26/2014  Check In:     Session Check In - 08/26/14 1155    Pain Assessment   Currently in Pain? Yes   Pain Score 3    Pain Location Chest   Pain Orientation Mid   Pain Descriptors / Indicators Discomfort   Pain Onset Today   Pain Frequency Intermittent   Aggravating Factors  Walking for 2 minutes on the treadmill chest discomfort started which was relieved by rest.    Pain Relieving Factors Rest         Goals Met:  Independence with exercise equipment  Goals Unmet:  RPE  Goals Comments: Had intermittent chest discomfort "not like my heart attack" after approx 2 minutes of exercising on the treadmill. 2.2mph  No incline. I called Dr. Gollan's office but they were unable to see him so recommended ER which I passed that info along. I called the [facility] office where patient is usually seen and they recommended Emerg Dept. Also. With rest Tyrail did not have any further chest pain. I left a vm for his daughter also after I asked his permission to call her.    Dr. Mark Miller is Medical Director for [facility] and [facility]. 

## 2014-08-26 NOTE — Telephone Encounter (Signed)
Pt in ED for evaluation - encounter closed.

## 2014-08-26 NOTE — ED Notes (Addendum)
Pt reports he was at rehab and began having chest pain when walking. Pt reports he has also been experiencing shortness of breath at home. Pt describes the pain as dull. No radiation, no dizziness or weakness. Pt had cardiac cath with stent placement here last week.

## 2014-08-26 NOTE — Telephone Encounter (Signed)
Discussed w/ Dr. Gwenlyn Found - he recommends ER evaluation d/t symptoms. Returned call to [facility] at [address] to discuss - she acknowledged - will relay to patient.

## 2014-08-26 NOTE — Telephone Encounter (Signed)
Pt of Dr. Ellyn Hack. Dr. Gwenlyn Found performed cath on 7/1 for this patient.  Pt had exertional CP at Cardiac Rehab in [place] today. Had been advised to go to ER - he did not. Went to [facility].  They performed EKG there - Dr. Lovena Le signed off - noted no changes - however, pt having some shortness of breath. Was recommended to go to ED - pt does not want to go.   [facility] triage called to find out of Dr. Ellyn Hack or other provider here could see patient. Informed Dr. Ellyn Hack comes in at 3pm - will defer to Dr. Gwenlyn Found (DoD) for advice.   Noted no availability on flex calendar at [place] I will talk to provider here to see if we can accomodate.

## 2014-08-26 NOTE — Patient Instructions (Signed)
Please proceed to the [facility] ER for evaluation.   Pt signed AMA form refusing EMS transportation to [facility].

## 2014-08-26 NOTE — Progress Notes (Signed)
Daily Session Note  Patient Details  Name: Joseph [name] MRN: [id number] Date of Birth: Oct 28, 1945 Referring Provider:  Leonie Man, MD  Encounter Date: 08/26/2014  Check In:     Session Check In - 08/26/14 0857    Check-In   Staff Present Lestine Box BS, ACSM EP-C, Exercise Physiologist;Renee Dillard [place] MS, ACSM CEP Exercise Physiologist;Carroll Enterkin RN, BSN   ER physicians immediately available to respond to emergencies See telemetry face sheet for immediately available ER MD   Medication changes reported     No   Fall or balance concerns reported    No   Warm-up and Cool-down Performed on first and last piece of equipment   VAD Patient? No   Pain Assessment   Currently in Pain? Other (Comment)  some chest pain experienced with exertion   Pain Score 2    Pain Location Chest   Pain Type Acute pain   Pain Frequency Intermittent   Multiple Pain Sites No         Goals Met:  Proper associated with RPD/PD & O2 Sat Strength training completed today  Goals Unmet:  Not Applicable  Goals Comments: Joseph [name] was experiencing some chest pain with exertion.     Dr. Emily Filbert is Medical Director for [facility] and [facility].

## 2014-08-26 NOTE — Telephone Encounter (Signed)
S/w Arbie Cookey, RN at Cardiac Rehab. Pt on treadmill at rehab and experiencing chest pain. Arbie Cookey asking if pt can be seen today. Dr. Acie Fredrickson currently rounding at hospital. Recommended to RN to have pt go to ED immediately to be evaluated. Arbie Cookey, RN, states she will have him go to ED.

## 2014-08-26 NOTE — ED Notes (Signed)
PT monitored by pulse ox, bp cuff, and 5-lead. 

## 2014-08-26 NOTE — Telephone Encounter (Signed)
Arbie Cookey called, pt having exertional chest pain at Cardiac Rehab. Advised to send pt to ER - understanding verbalized.

## 2014-08-26 NOTE — Addendum Note (Signed)
Addended by: Gerlene Burdock on: 08/26/2014 12:08 PM   Modules accepted: Orders, Medications

## 2014-08-26 NOTE — Progress Notes (Signed)
Cardiac Individual Treatment Plan  Patient Details  Name: Joseph [name] MRN: [id number] Date of Birth: 05/09/1945 Referring Provider:  Leonie Man, MD  Initial Encounter Date:    Visit Diagnosis: S/P PTCA (percutaneous transluminal coronary angioplasty)  Patient's Home Medications on Admission:  Current outpatient prescriptions:  .  ALPRAZolam (XANAX) 0.5 MG tablet, Take 0.5 mg by mouth at bedtime as needed for anxiety or sleep. , Disp: , Rfl:  .  aspirin 81 MG tablet, Take 81 mg by mouth daily., Disp: , Rfl:  .  BEE POLLEN PO, Take by mouth daily. 1 tsp, Disp: , Rfl:  .  calcitRIOL (ROCALTROL) 0.25 MCG capsule, Take 0.25 mcg by mouth 3 (three) times a week. On Monday, Wednesday, and Friday, Disp: , Rfl:  .  carvedilol (COREG) 12.5 MG tablet, Take 0.5 tablets (6.25 mg total) by mouth 2 (two) times daily with a meal., Disp: 60 tablet, Rfl: 2 .  clopidogrel (PLAVIX) 75 MG tablet, Take 1 tablet (75 mg total) by mouth daily., Disp: 90 tablet, Rfl: 3 .  hydrALAZINE (APRESOLINE) 50 MG tablet, Take 50 mg by mouth 3 (three) times daily., Disp: , Rfl:  .  isosorbide mononitrate (IMDUR) 30 MG 24 hr tablet, Take 30 mg by mouth daily., Disp: , Rfl:  .  LANTUS SOLOSTAR 100 UNIT/ML Solostar Pen, Inject 20 Units into the skin at bedtime. , Disp: , Rfl:  .  LYRICA 75 MG capsule, Take 75 mg by mouth 2 (two) times daily. , Disp: , Rfl: 0 .  nitroGLYCERIN (NITROLINGUAL) 0.4 MG/SPRAY spray, Place 1 spray under the tongue every 5 (five) minutes x 3 doses as needed for chest pain. Do not use together with sublingual nitro, Disp: 12 g, Rfl: 5 .  nitroGLYCERIN (NITROSTAT) 0.4 MG SL tablet, Place 1 tablet (0.4 mg total) under the tongue every 5 (five) minutes as needed for chest pain., Disp: 25 tablet, Rfl: 3 .  Omega-3 Fatty Acids (FISH OIL) 1200 MG CAPS, Take 1 capsule by mouth daily., Disp: , Rfl:  .  rosuvastatin (CRESTOR) 20 MG tablet, Take 20 mg by mouth daily., Disp: , Rfl:  .  sodium bicarbonate  650 MG tablet, Take 650 mg by mouth 2 (two) times daily. , Disp: , Rfl:  .  sodium polystyrene (KAYEXALATE) 15 GM/60ML suspension, Take 15 g by mouth 3 (three) times a week. Mon / Wed / Fri, Disp: , Rfl:  .  Spirulina 500 MG TABS, Take 1 tablet by mouth daily., Disp: , Rfl:  .  torsemide (DEMADEX) 20 MG tablet, Take 40 mg by mouth daily as needed (edema). , Disp: , Rfl:   Past Medical History: Past Medical History  Diagnosis Date  . Hypertension   . Hypercholesterolemia   . Prostate cancer   . Renal disorder     Dr. Juleen China  . Enlarged liver   . Spleen enlarged   . Thrombocytopenia   . Myocardial infarction   . Anxiety   . Depression   . Shortness of breath   . Diabetes mellitus without complication     INSULIN DEPENDENT  . GERD (gastroesophageal reflux disease)   . History of shingles   . CHF (congestive heart failure)   . Dialysis patient     currently on hold- Nov 2015  . CAD (coronary artery disease)     s/p CABG 2001, cath 07/30/2014 vein grafts all down, patent LIMA to LAD, significant LM and ost LCx dx. Cath 08/07/2014 Synergy DES 2.5 mm x  18 mm to dist LM and ost LCx    Tobacco Use: History  Smoking status  . Former Smoker  . Quit date: 02/07/2008  Smokeless tobacco  . 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 Participant demonstrates ability to understand and report RPE using BORG scale, to self-measure pulse accurately, and to acknowledge the importance of the exercise prescription.  Exercise Prescription Goal: Starting with aerobic activity 30 plus minutes a day, 3 days per week for initial exercise prescription. Provide home exercise  prescription and guidelines that participant acknowledges understanding prior to discharge.  Activity Barriers & Risk Stratification:     Activity Barriers & Risk Stratification - 08/24/14 1337    Activity Barriers & Risk Stratification   Activity Barriers None   Risk Stratification High      6 Minute Walk:     6 Minute Walk      08/24/14 1448       6 Minute Walk   Phase Initial     Distance 1265 feet     Walk Time 6 minutes     Resting HR 59 bpm     Resting BP 130/70 mmHg     Max Ex. HR 100 bpm     Max Ex. BP 160/78 mmHg     RPE 11     Symptoms Yes (comment)     Comments Patient had chest pain at pain level of 2 during walk. He informed me of this pain after the walk was over and said that pain was relieved once he stopped walking.         Initial Exercise Prescription:     Initial Exercise Prescription - 08/24/14 1400    Date of Initial Exercise Prescription   Date 08/24/14   Treadmill   MPH 2.3   Grade 0   Minutes 15   Bike   Level 0.8   Minutes 15   Recumbant Bike   Level 2   RPM 40   Watts 20   Minutes 15   NuStep   Level 3   Watts 30   Minutes 15   Arm Ergometer   Level 1   Watts 10   Minutes 15   Arm/Foot Ergometer   Level 1   Watts 12   Minutes 15   Cybex   Level 1   RPM 40   Minutes 15   Recumbant Elliptical   Level 1   RPM 40   Watts 20   Minutes 15   Elliptical   Level 1   Speed 3   Minutes 5   REL-XR   Level 3   Watts 30   Minutes 15   Prescription Details   Frequency (times per week) 3   Duration Progress to 30 minutes of continuous aerobic without signs/symptoms of physical distress   Intensity   THRR REST +  30   Ratings of Perceived Exertion 11-15   Progression Continue progressive overload as per policy without signs/symptoms or physical distress.   Resistance Training   Training Prescription Yes   Weight 2   Reps 10-12  Exercise Prescription Changes:   Discharge Exercise Prescription (Final Exercise  Prescription Changes):   Nutrition:  Target Goals: Understanding of nutrition guidelines, daily intake of sodium 1500mg , cholesterol 200mg , calories 30% from fat and 7% or less from saturated fats, daily to have 5 or more servings of fruits and vegetables.  Biometrics:     Pre Biometrics - 08/24/14 1459    Pre Biometrics   Height 5\' 10"  (1.778 m)   Weight 223 lb (101.152 kg)   Waist Circumference 44.5 inches   Hip Circumference 41 inches   Waist to Hip Ratio 1.09 %   BMI (Calculated) 32.1       Nutrition Therapy Plan and Nutrition Goals:   Nutrition Discharge: Rate Your Plate Scores:   Nutrition Goals Re-Evaluation:   Psychosocial: Target Goals: Acknowledge presence or absence of depression, maximize coping skills, provide positive support system. Participant is able to verbalize types and ability to use techniques and skills needed for reducing stress and depression.  Initial Review & Psychosocial Screening:     Initial Psych Review & Screening - 08/24/14 1358    Screening Interventions   Interventions Encouraged to exercise;Program counselor consult      Quality of Life Scores:     Quality of Life - 08/25/14 0738    Quality of Life Scores   Health/Function Pre 27.86 %   Socioeconomic Pre 30 %   Psych/Spiritual Pre 30 %   Family Pre 30 %   GLOBAL Pre 29.03 %      PHQ-9:     Recent Review Flowsheet Data    Depression screen [facility] 2/9 08/24/2014   Decreased Interest 0   Down, Depressed, Hopeless 0   PHQ - 2 Score 0   Altered sleeping 0   Tired, decreased energy 0   Change in appetite 0   Feeling bad or failure about yourself  0   Trouble concentrating 0   Moving slowly or fidgety/restless 0   Suicidal thoughts 0   PHQ-9 Score 0      Psychosocial Evaluation and Intervention:   Psychosocial Re-Evaluation:   Vocational Rehabilitation: Provide vocational rehab assistance to qualifying candidates.   Vocational Rehab Evaluation &  Intervention:     Vocational Rehab - 08/24/14 1338    Initial Vocational Rehab Evaluation & Intervention   Assessment shows need for Vocational Rehabilitation No      Education: Education Goals: Education classes will be provided on a weekly basis, covering required topics. Participant will state understanding/return demonstration of topics presented.  Learning Barriers/Preferences:     Learning Barriers/Preferences - 08/24/14 1338    Learning Barriers/Preferences   Learning Barriers None   Learning Preferences None      Education Topics: General Nutrition Guidelines/Fats and Fiber: -Group instruction provided by verbal, written material, models and posters to present the general guidelines for heart healthy nutrition. Gives an explanation and review of dietary fats and fiber.   Controlling Sodium/Reading Food Labels: -Group verbal and written material supporting the discussion of sodium use in heart healthy nutrition. Review and explanation with models, verbal and written materials for utilization of the food label.   Exercise Physiology & Risk Factors: - Group verbal and written instruction with models to review the exercise physiology of the cardiovascular system and associated critical values. Details cardiovascular disease risk factors and the goals associated with each risk factor.   Aerobic Exercise & Resistance Training: - Gives group verbal and written discussion on the health impact of inactivity. On the components  of aerobic and resistive training programs and the benefits of this training and how to safely progress through these programs.   Flexibility, Balance, General Exercise Guidelines: - Provides group verbal and written instruction on the benefits of flexibility and balance training programs. Provides general exercise guidelines with specific guidelines to those with heart or lung disease. Demonstration and skill practice provided.   Stress Management: -  Provides group verbal and written instruction about the health risks of elevated stress, cause of high stress, and healthy ways to reduce stress.   Depression: - Provides group verbal and written instruction on the correlation between heart/lung disease and depressed mood, treatment options, and the stigmas associated with seeking treatment.   Anatomy & Physiology of the Heart: - Group verbal and written instruction and models provide basic cardiac anatomy and physiology, with the coronary electrical and arterial systems. Review of: AMI, Angina, Valve disease, Heart Failure, Cardiac Arrhythmia, Pacemakers, and the ICD.   Cardiac Procedures: - Group verbal and written instruction and models to describe the testing methods done to diagnose heart disease. Reviews the outcomes of the test results. Describes the treatment choices: Medical Management, Angioplasty, or Coronary Bypass Surgery.   Cardiac Medications: - Group verbal and written instruction to review commonly prescribed medications for heart disease. Reviews the medication, class of the drug, and side effects. Includes the steps to properly store meds and maintain the prescription regimen.   Go Sex-Intimacy & Heart Disease, Get SMART - Goal Setting: - Group verbal and written instruction through game format to discuss heart disease and the return to sexual intimacy. Provides group verbal and written material to discuss and apply goal setting through the application of the S.M.A.R.T. Method.   Other Matters of the Heart: - Provides group verbal, written materials and models to describe Heart Failure, Angina, Valve Disease, and Diabetes in the realm of heart disease. Includes description of the disease process and treatment options available to the cardiac patient.   Exercise & Equipment Safety: - Individual verbal instruction and demonstration of equipment use and safety with use of the equipment.          Cardiac Rehab from  08/24/2014 in [facility]   Date  08/24/14   Educator  SB   Instruction Review Code  2- meets goals/outcomes      Infection Prevention: - Provides verbal and written material to individual with discussion of infection control including proper hand washing and proper equipment cleaning during exercise session.      Cardiac Rehab from 08/24/2014 in [facility]   Date  08/24/14   Educator  SB   Instruction Review Code  2- meets goals/outcomes      Falls Prevention: - Provides verbal and written material to individual with discussion of falls prevention and safety.      Cardiac Rehab from 08/24/2014 in [facility] Cardiac Rehab   Date  08/24/14   Educator  SB   Instruction Review Code  2- meets goals/outcomes      Diabetes: - Individual verbal and written instruction to review signs/symptoms of diabetes, desired ranges of glucose level fasting, after meals and with exercise. Advice that pre and post exercise glucose checks will be done for 3 sessions at entry of program.      Cardiac Rehab from 08/24/2014 in [facility] Cardiac Rehab   Date  08/24/14   Educator  SB   Instruction Review Code  2- meets goals/outcomes       Knowledge Questionnaire Score:  Knowledge Questionnaire Score - 08/25/14 0713    Knowledge Questionnaire Score   Pre Score 26/28      Personal Goals and Risk Factors at Admission:     Personal Goals and Risk Factors at Admission - 08/24/14 1348    Personal Goals and Risk Factors on Admission   Increase Aerobic Exercise and Physical Activity Yes   Intervention While in program, learn and follow the exercise prescription taught. Start at a low level workload and increase workload after able to maintain previous level for 30 minutes. Increase time before increasing intensity.   Diabetes Yes   Goal Blood glucose control identified by blood glucose values, HgbA1C. Participant verbalizes understanding of the signs/symptoms of hyper/hypo glycemia, proper foot  care and importance of medication and nutrition plan for blood glucose control.   Intervention Provide nutrition & aerobic exercise along with prescribed medications to achieve blood glucose in normal ranges: Fasting 65-99 mg/dL   Hypertension Yes   Goal Participant will see blood pressure controlled within the values of 140/28mm/Hg or within value directed by their physician.   Intervention Provide nutrition & aerobic exercise along with prescribed medications to achieve BP 140/90 or less.   Lipids Yes   Goal Cholesterol controlled with medications as prescribed, with individualized exercise RX and with personalized nutrition plan. Value goals: LDL < 70mg , HDL > 40mg . Participant states understanding of desired cholesterol values and following prescriptions.   Intervention Provide nutrition & aerobic exercise along with prescribed medications to achieve LDL 70mg , HDL >40mg .      Personal Goals and Risk Factors Review:      Goals and Risk Factor Review      08/26/14 1202           Increase Aerobic Exercise and Physical Activity   Goals Progress/Improvement seen  No       Comments Goals Comments: Had intermittent chest discomfort "not like my heart attack" after approx 2 minutes of exercising on the treadmill. 2.85mph  No incline. I called Dr. Donivan Scull office but they were unable to see him so recommended ER which I passed that info along. I called the [facility] office where patient is usually seen and they recommended Emerg Dept. Also. With rest Belva did not have any further chest pain. I left a vm for his daughter also after I asked his permission to call her.           Personal Goals Discharge:     Comments: I called to check on patient and no answer then noted he is in the Emergency Dept per EPIC and EKG done and blood work.

## 2014-08-27 ENCOUNTER — Observation Stay (HOSPITAL_COMMUNITY): Payer: Medicare Other

## 2014-08-27 ENCOUNTER — Other Ambulatory Visit: Payer: Self-pay

## 2014-08-27 DIAGNOSIS — I10 Essential (primary) hypertension: Secondary | ICD-10-CM | POA: Diagnosis present

## 2014-08-27 DIAGNOSIS — I2510 Atherosclerotic heart disease of native coronary artery without angina pectoris: Secondary | ICD-10-CM | POA: Diagnosis not present

## 2014-08-27 DIAGNOSIS — R001 Bradycardia, unspecified: Secondary | ICD-10-CM | POA: Diagnosis not present

## 2014-08-27 DIAGNOSIS — E780 Pure hypercholesterolemia: Secondary | ICD-10-CM | POA: Diagnosis not present

## 2014-08-27 DIAGNOSIS — I5022 Chronic systolic (congestive) heart failure: Secondary | ICD-10-CM | POA: Diagnosis not present

## 2014-08-27 DIAGNOSIS — R0789 Other chest pain: Secondary | ICD-10-CM | POA: Diagnosis not present

## 2014-08-27 DIAGNOSIS — R079 Chest pain, unspecified: Secondary | ICD-10-CM | POA: Diagnosis not present

## 2014-08-27 DIAGNOSIS — Z9861 Coronary angioplasty status: Secondary | ICD-10-CM

## 2014-08-27 DIAGNOSIS — I255 Ischemic cardiomyopathy: Secondary | ICD-10-CM

## 2014-08-27 LAB — CBC
HCT: 27.8 % — ABNORMAL LOW (ref 39.0–52.0)
HEMOGLOBIN: 9 g/dL — AB (ref 13.0–17.0)
MCH: 28.3 pg (ref 26.0–34.0)
MCHC: 32.4 g/dL (ref 30.0–36.0)
MCV: 87.4 fL (ref 78.0–100.0)
PLATELETS: 58 10*3/uL — AB (ref 150–400)
RBC: 3.18 MIL/uL — ABNORMAL LOW (ref 4.22–5.81)
RDW: 15.1 % (ref 11.5–15.5)
WBC: 5.2 10*3/uL (ref 4.0–10.5)

## 2014-08-27 LAB — BASIC METABOLIC PANEL
Anion gap: 10 (ref 5–15)
BUN: 58 mg/dL — ABNORMAL HIGH (ref 6–20)
CALCIUM: 7.9 mg/dL — AB (ref 8.9–10.3)
CHLORIDE: 110 mmol/L (ref 101–111)
CO2: 21 mmol/L — AB (ref 22–32)
Creatinine, Ser: 3.98 mg/dL — ABNORMAL HIGH (ref 0.61–1.24)
GFR calc non Af Amer: 14 mL/min — ABNORMAL LOW (ref >60)
GFR, EST AFRICAN AMERICAN: 17 mL/min — AB (ref >60)
Glucose, Bld: 101 mg/dL — ABNORMAL HIGH (ref 65–99)
POTASSIUM: 4.8 mmol/L (ref 3.5–5.1)
Sodium: 141 mmol/L (ref 135–145)

## 2014-08-27 LAB — GLUCOSE, CAPILLARY
GLUCOSE-CAPILLARY: 114 mg/dL — AB (ref 65–99)
GLUCOSE-CAPILLARY: 176 mg/dL — AB (ref 65–99)
Glucose-Capillary: 85 mg/dL (ref 65–99)
Glucose-Capillary: 174 mg/dL — ABNORMAL HIGH (ref 65–99)

## 2014-08-27 LAB — TROPONIN I: TROPONIN I: 0.03 ng/mL (ref <0.031)

## 2014-08-27 LAB — PLATELET INHIBITION P2Y12: PLATELET FUNCTION P2Y12: 212 [PRU] (ref 194–418)

## 2014-08-27 MED ORDER — HYDRALAZINE HCL 50 MG PO TABS
75.0000 mg | ORAL_TABLET | Freq: Three times a day (TID) | ORAL | Status: DC
  Administered 2014-08-27: 75 mg via ORAL
  Filled 2014-08-27 (×2): qty 1

## 2014-08-27 MED ORDER — ISOSORBIDE MONONITRATE ER 60 MG PO TB24: 60.0000 mg | ORAL_TABLET | Freq: Every day | ORAL | Status: DC

## 2014-08-27 MED ORDER — RANEXA 500 MG PO TB12: 500.0000 mg | ORAL_TABLET | Freq: Two times a day (BID) | ORAL | Status: DC

## 2014-08-27 MED ORDER — FUROSEMIDE 40 MG PO TABS: 40.0000 mg | ORAL_TABLET | Freq: Every day | ORAL | Status: DC

## 2014-08-27 MED ORDER — TECHNETIUM TO 99M ALBUMIN AGGREGATED
6.0000 | Freq: Once | INTRAVENOUS | Status: AC | PRN
  Administered 2014-08-27: 6 via INTRAVENOUS

## 2014-08-27 MED ORDER — TORSEMIDE 20 MG PO TABS
20.0000 mg | ORAL_TABLET | Freq: Every day | ORAL | Status: DC
  Administered 2014-08-27: 20 mg via ORAL
  Filled 2014-08-27: qty 1

## 2014-08-27 MED ORDER — TECHNETIUM TC 99M DIETHYLENETRIAME-PENTAACETIC ACID: 40.0000 | Freq: Once | INTRAVENOUS | Status: DC | PRN

## 2014-08-27 MED ORDER — HYDRALAZINE HCL 25 MG PO TABS: 75.0000 mg | ORAL_TABLET | Freq: Three times a day (TID) | ORAL | Status: DC

## 2014-08-27 MED ORDER — ACETAMINOPHEN 325 MG PO TABS: 650.0000 mg | ORAL_TABLET | ORAL | Status: DC | PRN

## 2014-08-27 MED ORDER — TORSEMIDE 20 MG PO TABS: 20.0000 mg | ORAL_TABLET | Freq: Every day | ORAL | Status: DC

## 2014-08-27 NOTE — ED Provider Notes (Signed)
CSN: [id number]     Arrival date & time 08/26/14  1107 History   First MD Initiated Contact with Patient 08/26/14 1116     Chief Complaint  Patient presents with  . Chest Pain  . Shortness of Breath      HPI  Patient presents for evaluation of chest pain. History of coronary artery disease. Status post CABG at [facility] in 2001. Most recent studies include patient are Showing left main ostial lesion, as well as circumflex osteal lesion. Was scheduled for outpatient PTCA. However, presented with non-ST elevation MI. Underwent catheterization and had stenting of his left main and left circumflex ostial lesion. Symptoms improved. Was discharged. Was functioning well with minimal symptoms. Start cardiac rehabilitation a few days ago. Has had intermittent episodes of exertional pain. States these are different between he presents with his MI. Is more dull and lateral left chest pain. Today he was at cardiac rehabilitation and walked less than 2 minutes became short of breath and anterior chest pain. He stopped his symptoms resolved. Was recommended to come to the emergency room. Apparently went to the cardiology office and was referred here.  He arrives here symptom free.  Past Medical History  Diagnosis Date  . Hypertension   . Hypercholesterolemia   . Prostate cancer   . Renal disorder     Dr. Juleen China  . Enlarged liver   . Spleen enlarged   . Thrombocytopenia   . Myocardial infarction   . Anxiety   . Depression   . Shortness of breath   . Diabetes mellitus without complication     INSULIN DEPENDENT  . GERD (gastroesophageal reflux disease)   . History of shingles   . CHF (congestive heart failure)   . Dialysis patient     currently on hold- Nov 2015  . CAD (coronary artery disease)     s/p CABG 2001, cath 07/30/2014 vein grafts all down, patent LIMA to LAD, significant LM and ost LCx dx. Cath 08/07/2014 Synergy DES 2.5 mm x 18 mm to dist LM and ost LCx   Past Surgical History  Procedure  Laterality Date  . Cardiac surgery    . Shoulder arthroscopy    . Penile prosthesis implant    . Myocardial infarction    . Cardiac catheterization    . Coronary angioplasty    . Coronary artery bypass graft  2001  . Colonoscopy    . Liver biopsy    . Av fistula placement Left 2015  . Cardiac catheterization N/A 07/30/2014    Procedure: Left Heart Cath and Cors/Grafts Angiography;  Surgeon: Dionisio David, MD;  Location: [facility];  Service: Cardiovascular;  Laterality: N/A;  . Cardiac catheterization N/A 08/07/2014    Procedure: Coronary Stent Intervention;  Surgeon: Lorretta Harp, MD;  Location: [facility];  Service: Cardiovascular;  Laterality: N/A;   Family History  Problem Relation Age of Onset  . Acute myelogenous leukemia Brother   . CAD Brother   . Diabetes Mellitus II Brother    History  Substance Use Topics  . Smoking status: Former Smoker    Quit date: 02/07/2008  . Smokeless tobacco: Former Systems developer  . Alcohol Use: No    Review of Systems  Constitutional: Negative for fever, chills, diaphoresis, appetite change and fatigue.  HENT: Negative for mouth sores, sore throat and trouble swallowing.   Eyes: Negative for visual disturbance.  Respiratory: Positive for shortness of breath. Negative for cough, chest tightness and wheezing.  Cardiovascular: Positive for chest pain.  Gastrointestinal: Negative for nausea, vomiting, abdominal pain, diarrhea and abdominal distention.  Endocrine: Negative for polydipsia, polyphagia and polyuria.  Genitourinary: Negative for dysuria, frequency and hematuria.  Musculoskeletal: Negative for gait problem.  Skin: Negative for color change, pallor and rash.  Neurological: Negative for dizziness, syncope, light-headedness and headaches.  Hematological: Does not bruise/bleed easily.  Psychiatric/Behavioral: Negative for behavioral problems and confusion.      Allergies  Sulfa antibiotics  Home Medications    Prior to Admission medications   Medication Sig Start Date End Date Taking? Authorizing Provider  ALPRAZolam Duanne Moron) 0.5 MG tablet Take 0.5 mg by mouth at bedtime as needed for anxiety or sleep.  03/30/13  Yes Historical Provider, MD  aspirin 81 MG tablet Take 81 mg by mouth daily.   Yes Historical Provider, MD  BEE POLLEN PO Take by mouth daily. 1 tsp   Yes Historical Provider, MD  calcitRIOL (ROCALTROL) 0.25 MCG capsule Take 0.25 mcg by mouth 3 (three) times a week. On Monday, Wednesday, and Friday   Yes Historical Provider, MD  carvedilol (COREG) 12.5 MG tablet Take 0.5 tablets (6.25 mg total) by mouth 2 (two) times daily with a meal. 08/08/14  Yes Almyra Deforest, PA  clopidogrel (PLAVIX) 75 MG tablet Take 1 tablet (75 mg total) by mouth daily. 08/18/14  Yes Scott Joylene Draft, PA-C  hydrALAZINE (APRESOLINE) 50 MG tablet Take 50 mg by mouth 3 (three) times daily. 03/20/13  Yes Historical Provider, MD  isosorbide mononitrate (IMDUR) 30 MG 24 hr tablet Take 30 mg by mouth daily.   Yes Historical Provider, MD  LANTUS SOLOSTAR 100 UNIT/ML Solostar Pen Inject 20 Units into the skin at bedtime.    Yes Historical Provider, MD  LYRICA 75 MG capsule Take 75 mg by mouth 2 (two) times daily.    Yes Historical Provider, MD  nitroGLYCERIN (NITROLINGUAL) 0.4 MG/SPRAY spray Place 1 spray under the tongue every 5 (five) minutes x 3 doses as needed for chest pain. Do not use together with sublingual nitro 08/08/14  Yes Almyra Deforest, PA  nitroGLYCERIN (NITROSTAT) 0.4 MG SL tablet Place 1 tablet (0.4 mg total) under the tongue every 5 (five) minutes as needed for chest pain. 08/08/14  Yes Almyra Deforest, PA  Omega-3 Fatty Acids (FISH OIL) 1200 MG CAPS Take 1 capsule by mouth daily.   Yes Historical Provider, MD  RANEXA 500 MG 12 hr tablet Take 500 mg by mouth 2 (two) times daily. 07/31/14  Yes Historical Provider, MD  rosuvastatin (CRESTOR) 20 MG tablet Take 20 mg by mouth daily.   Yes Historical Provider, MD  sodium bicarbonate 650 MG  tablet Take 650 mg by mouth 2 (two) times daily.    Yes Historical Provider, MD  sodium polystyrene (KAYEXALATE) 15 GM/60ML suspension Take 15 g by mouth 3 (three) times a week. Mon / Wed / Fri   Yes Historical Provider, MD  Spirulina 500 MG TABS Take 1 tablet by mouth daily.   Yes Historical Provider, MD  torsemide (DEMADEX) 20 MG tablet Take 40 mg by mouth daily as needed (edema).    Yes Historical Provider, MD   BP 158/80 mmHg  Pulse 51  Temp(Src) 97.6 F (36.4 C) (Oral)  Resp 18  Ht 5' 10.5" (1.791 m)  Wt 217 lb 8 oz (98.657 kg)  BMI 30.76 kg/m2  SpO2 96% Physical Exam  Constitutional: He is oriented to person, place, and time. He appears well-developed and well-nourished. No distress.  HENT:  Head: Normocephalic.  Eyes: Conjunctivae are normal. Pupils are equal, round, and reactive to light. No scleral icterus.  Neck: Normal range of motion. Neck supple. No thyromegaly present.  Cardiovascular: Normal rate and regular rhythm.  Exam reveals no gallop and no friction rub.   No murmur heard. Sinus bradycardia  Pulmonary/Chest: Effort normal and breath sounds normal. No respiratory distress. He has no wheezes. He has no rales.  Abdominal: Soft. Bowel sounds are normal. He exhibits no distension. There is no tenderness. There is no rebound.  Musculoskeletal: Normal range of motion.  Neurological: He is alert and oriented to person, place, and time.  Skin: Skin is warm and dry. No rash noted.  Psychiatric: He has a normal mood and affect. His behavior is normal.    ED Course  Procedures (including critical care time) Labs Review Labs Reviewed  BASIC METABOLIC PANEL - Abnormal; Notable for the following:    Potassium 5.7 (*)    CO2 21 (*)    Glucose, Bld 132 (*)    BUN 61 (*)    Creatinine, Ser 4.01 (*)    Calcium 8.0 (*)    GFR calc non Af Amer 14 (*)    GFR calc Af Amer 17 (*)    All other components within normal limits  CBC - Abnormal; Notable for the following:    RBC  3.26 (*)    Hemoglobin 9.2 (*)    HCT 28.8 (*)    Platelets 65 (*)    All other components within normal limits  TROPONIN I - Abnormal; Notable for the following:    Troponin I 0.04 (*)    All other components within normal limits  D-DIMER, QUANTITATIVE (NOT AT [facility]) - Abnormal; Notable for the following:    D-Dimer, Quant 0.75 (*)    All other components within normal limits  CBC - Abnormal; Notable for the following:    RBC 3.18 (*)    Hemoglobin 9.0 (*)    HCT 27.8 (*)    Platelets 58 (*)    All other components within normal limits  BASIC METABOLIC PANEL - Abnormal; Notable for the following:    CO2 21 (*)    Glucose, Bld 101 (*)    BUN 58 (*)    Creatinine, Ser 3.98 (*)    Calcium 7.9 (*)    GFR calc non Af Amer 14 (*)    GFR calc Af Amer 17 (*)    All other components within normal limits  GLUCOSE, CAPILLARY - Abnormal; Notable for the following:    Glucose-Capillary 133 (*)    All other components within normal limits  GLUCOSE, CAPILLARY - Abnormal; Notable for the following:    Glucose-Capillary 199 (*)    All other components within normal limits  TROPONIN I  MAGNESIUM  TROPONIN I  TROPONIN I  GLUCOSE, CAPILLARY  GLUCOSE, CAPILLARY    Imaging Review Dg Chest 2 View  08/26/2014   CLINICAL DATA:  69 year old male with chest pain and shortness of Breath. Initial encounter. Dialysis, diabetes, hypertension.  EXAM: CHEST  2 VIEW  COMPARISON:  08/05/2014 and earlier.  FINDINGS: Stable cardiomegaly and mediastinal contours. Stable sequelae of CABG. Interval decreased pulmonary vascularity. No pneumothorax. No definite pleural effusion. No confluent pulmonary opacity. Stable lung volumes. No acute osseous abnormality identified.  IMPRESSION: Resolved vascular congestion since June. No acute cardiopulmonary abnormality.   Electronically Signed   By: Genevie Ann M.D.   On: 08/26/2014 13:10     EKG  Interpretation   Date/Time:  Wednesday August 26 2014 12:21:59 EDT Ventricular  Rate:  48 PR Interval:  189 QRS Duration: 123 QT Interval:  456 QTC Calculation: 407 R Axis:   53 Text Interpretation:  Sinus bradycardia Nonspecific intraventricular  conduction delay Inferior infarct, old Lateral leads are also involved ED  PHYSICIAN INTERPRETATION AVAILABLE IN [facility] [place] Confirmed by TEST,  Record ([id number]) on 08/27/2014 6:41:25 AM      MDM   Final diagnoses:  Chest pain, unspecified chest pain type    Patient presents with chest pain different than his anginal pain. However, he has had recent interventions. First enzymes are normal. EKG is unchanged. Remains symptom-free here. Seen by currently. Plan is admission for serial cardiac enzymes.    Tanna Furry, MD 08/27/14 0700

## 2014-08-27 NOTE — Discharge Summary (Signed)
Patient ID: Joseph [name],  MRN: [id number], DOB/AGE: 1945-03-13 69 y.o.  Admit date: 08/26/2014 Discharge date: 08/27/2014  Primary Care Provider: Marden Noble, MD Primary Cardiologist: Dr Ellyn Hack  Discharge Diagnoses Principal Problem:   Atypical chest pain Active Problems:   CAD S/P PCI/DES July 2016   Type 2 diabetes mellitus with renal manifestations   Chronic renal disease, stage IV   Chronic systolic CHF (congestive heart failure)   Ischemic cardiomyopathy   Essential hypertension   Hx of CABG 2001    Procedures: VQ scan 08/27/14   Hospital Course:  69 y.o. male with a hx of CAD status post CABG in 2001 at [facility] (LIMA-LAD, SVG-D2, SVG-OM1, SVG-right PDA, SVG-right PL1) and prior PCI, CKD stage IV (previously treated with dialysis), chronic thrombocytopenia, prostate CA. Patient underwent cardiac catheterization with Dr. Humphrey Rolls in [facility] 6/23. This demonstrated all vein grafts occluded. LIMA to the LAD was patent. There was a 70% ostial left main lesion, 70% ostial LCx and an occluded RCA. The patient's case was discussed with Dr. Terrence Dupont for consideration of PCI. Because transitioning care to Dr. Ellyn Hack in [place], we assumed his care in June 2016. Echocardiogram demonstrated severely reduced LV function with an EF of 25-30%, multiple wall motion abnormalities, moderate diastolic dysfunction and moderately elevated pulmonary pressures with a PASP 49 mmHg and moderately reduced RV function. He was seen by nephrology who recommended IV fluids and minimal contrast dye. The patient underwent cardiac catheterization with Dr. Gwenlyn Found 08/07/14. He was noted to have Critical left main disease and ostial left circumflex disease. This was treated with a Synergy DES. Now admitted for chest pain at cardiac rehab - also has had palpitations and had holter X 48 hours, with PACs, burst of PAT and brady to the 40s. EKG in ER SB to 47 and up to low 50s. The pt was admitted and ruled  out for an MI. His D-dimer was elevated and it was felt he should have a VQ which was done 08/27/14 and was low risk for PE. His Hydralyzine was increased for afterload reduction. Torsemide continued on a daily schedule. He was instructed to start taking Ranexa as previously ordered. He will f/u in [place] on a day Dr Ellyn Hack is in the office there.   Discharge Vitals:  Blood pressure 150/59, pulse 56, temperature 98.1 F (36.7 C), temperature source Oral, resp. rate 18, height 5' 10.5" (1.791 m), weight 217 lb 8 oz (98.657 kg), SpO2 98 %.    Labs: Results for orders placed or performed during the hospital encounter of 08/26/14 (from the past 24 hour(s))  Glucose, capillary     Status: Abnormal   Collection Time: 08/26/14  8:20 PM  Result Value Ref Range   Glucose-Capillary 199 (H) 65 - 99 mg/dL  Troponin I     Status: None   Collection Time: 08/26/14 10:26 PM  Result Value Ref Range   Troponin I 0.03 <0.031 ng/mL  Glucose, capillary     Status: None   Collection Time: 08/27/14  1:38 AM  Result Value Ref Range   Glucose-Capillary 85 65 - 99 mg/dL  Troponin I     Status: None   Collection Time: 08/27/14  3:26 AM  Result Value Ref Range   Troponin I 0.03 <0.031 ng/mL  CBC     Status: Abnormal   Collection Time: 08/27/14  3:26 AM  Result Value Ref Range   WBC 5.2 4.0 - 10.5 K/uL   RBC 3.18 (  L) 4.22 - 5.81 MIL/uL   Hemoglobin 9.0 (L) 13.0 - 17.0 g/dL   HCT 27.8 (L) 39.0 - 52.0 %   MCV 87.4 78.0 - 100.0 fL   MCH 28.3 26.0 - 34.0 pg   MCHC 32.4 30.0 - 36.0 g/dL   RDW 15.1 11.5 - 15.5 %   Platelets 58 (L) 150 - 400 K/uL  Basic metabolic panel     Status: Abnormal   Collection Time: 08/27/14  3:26 AM  Result Value Ref Range   Sodium 141 135 - 145 mmol/L   Potassium 4.8 3.5 - 5.1 mmol/L   Chloride 110 101 - 111 mmol/L   CO2 21 (L) 22 - 32 mmol/L   Glucose, Bld 101 (H) 65 - 99 mg/dL   BUN 58 (H) 6 - 20 mg/dL   Creatinine, Ser 3.98 (H) 0.61 - 1.24 mg/dL   Calcium 7.9 (L) 8.9 -  10.3 mg/dL   GFR calc non Af Amer 14 (L) >60 mL/min   GFR calc Af Amer 17 (L) >60 mL/min   Anion gap 10 5 - 15  Glucose, capillary     Status: Abnormal   Collection Time: 08/27/14  7:24 AM  Result Value Ref Range   Glucose-Capillary 114 (H) 65 - 99 mg/dL  Glucose, capillary     Status: Abnormal   Collection Time: 08/27/14 11:11 AM  Result Value Ref Range   Glucose-Capillary 176 (H) 65 - 99 mg/dL  Platelet inhibition p2y12     Status: None   Collection Time: 08/27/14  1:45 PM  Result Value Ref Range   Platelet Function  P2Y12 212 194 - 418 PRU  Glucose, capillary     Status: Abnormal   Collection Time: 08/27/14  4:34 PM  Result Value Ref Range   Glucose-Capillary 174 (H) 65 - 99 mg/dL    Disposition:      Follow-up Information    Follow up with DUNN,RYAN, PA-C.   Specialties:  Physician Assistant, Interventional Cardiology, Radiology   Why:  office will call    Contact information:   [address] [place] [address] [phone]       Discharge Medications:    Medication List    TAKE these medications        acetaminophen 325 MG tablet  Commonly known as:  TYLENOL  Take 2 tablets (650 mg total) by mouth every 4 (four) hours as needed for headache or mild pain.     ALPRAZolam 0.5 MG tablet  Commonly known as:  XANAX  Take 0.5 mg by mouth at bedtime as needed for anxiety or sleep.     aspirin 81 MG tablet  Take 81 mg by mouth daily.     BEE POLLEN PO  Take by mouth daily. 1 tsp     calcitRIOL 0.25 MCG capsule  Commonly known as:  ROCALTROL  Take 0.25 mcg by mouth 3 (three) times a week. On Monday, Wednesday, and Friday     carvedilol 12.5 MG tablet  Commonly known as:  COREG  Take 0.5 tablets (6.25 mg total) by mouth 2 (two) times daily with a meal.     clopidogrel 75 MG tablet  Commonly known as:  PLAVIX  Take 1 tablet (75 mg total) by mouth daily.     Fish Oil 1200 MG Caps  Take 1 capsule by mouth daily.     hydrALAZINE 25 MG  tablet  Commonly known as:  APRESOLINE  Take 3 tablets (75 mg  total) by mouth 3 (three) times daily.     isosorbide mononitrate 60 MG 24 hr tablet  Commonly known as:  IMDUR  Take 1 tablet (60 mg total) by mouth daily.     LANTUS SOLOSTAR 100 UNIT/ML Solostar Pen  Generic drug:  Insulin Glargine  Inject 20 Units into the skin at bedtime.     LYRICA 75 MG capsule  Generic drug:  pregabalin  Take 75 mg by mouth 2 (two) times daily.     nitroGLYCERIN 0.4 MG/SPRAY spray  Commonly known as:  NITROLINGUAL  Place 1 spray under the tongue every 5 (five) minutes x 3 doses as needed for chest pain. Do not use together with sublingual nitro     RANEXA 500 MG 12 hr tablet  Generic drug:  ranolazine  Take 500 mg by mouth 2 (two) times daily.     rosuvastatin 20 MG tablet  Commonly known as:  CRESTOR  Take 20 mg by mouth daily.     sodium bicarbonate 650 MG tablet  Take 650 mg by mouth 2 (two) times daily.     sodium polystyrene 15 GM/60ML suspension  Commonly known as:  KAYEXALATE  Take 15 g by mouth 3 (three) times a week. Mon / Wed / Fri     Spirulina 500 MG Tabs  Take 1 tablet by mouth daily.     torsemide 20 MG tablet  Commonly known as:  DEMADEX  Take 1 tablet (20 mg total) by mouth daily.         Duration of Discharge Encounter: Greater than 30 minutes including physician time.  Angelena Form PA-C 08/27/2014 6:40 PM

## 2014-08-27 NOTE — Progress Notes (Signed)
Patient states he does not take renexa anymore.

## 2014-08-27 NOTE — Progress Notes (Signed)
Pt.Profile: 69 y.o. male with a hx of CAD status post CABG in 2001 at [place] (LIMA-LAD, SVG-D2, SVG-OM1, SVG-right PDA, SVG-right PL1) and prior PCI, CKD stage IV (previously treated with dialysis), chronic thrombocytopenia, prostate CA. Patient underwent cardiac catheterization with Dr. Humphrey Rolls in [facility] 6/23. This demonstrated all vein grafts occluded. LIMA to the LAD was patent. There was a 70% ostial left main lesion, 70% ostial LCx and an occluded RCA. The patient's case was discussed with Dr. Terrence Dupont for consideration of PCI. Because transitioning care to Dr. Ellyn Hack in [place], we assumed his care in June 2016.    Echocardiogram demonstrated severely reduced LV function with an EF of 25-30%, multiple wall motion abnormalities, moderate diastolic dysfunction and moderately elevated pulmonary pressures with a PASP 49 mmHg and moderately reduced RV function. He was seen by nephrology who recommended IV fluids and minimal contrast dye. The patient underwent cardiac catheterization with Dr. Gwenlyn Found 08/07/14. He was noted to have Critical left main disease and ostial left circumflex disease. This was treated with a Synergy DES.   Now admitted for chest pain at cardiac rehab - also has had palpitations and had holter X 48 hours, with PACs, burst of PAT and brady to the 40s.   EKG in ER SB to 47 and up to low 50s.     Subjective: No pain today.    Objective: Vital signs in last 24 hours: Temp:  [97.6 F (36.4 C)-98.2 F (36.8 C)] 97.6 F (36.4 C) (07/21 0523) Pulse Rate:  [45-88] 51 (07/20 2011) Resp:  [12-20] 18 (07/20 2011) BP: (119-158)/(56-96) 158/80 mmHg (07/21 0523) SpO2:  [86 %-100 %] 96 % (07/21 0523) Weight:  [217 lb 8 oz (98.657 kg)-220 lb (99.791 kg)] 217 lb 8 oz (98.657 kg) (07/21 0523) Weight change:  Last BM Date: 08/26/14 Intake/Output from previous day: +240 07/20 0701 - 07/21 0700 In: 240 [P.O.:240] Out: -  Intake/Output this shift: Total I/O In: 240 [P.O.:240] Out:  -   PE: General:Pleasant affect, NAD Skin:Warm and dry, brisk capillary refill HEENT:normocephalic, sclera clear, mucus membranes moist Heart:S1S2 RRR without murmur, gallup, rub or click Lungs: with few rales in bases, rhonchi, or wheezes PRF:FMBW, non tender, + BS, do not palpate liver spleen or masses Ext:no lower ext edema, 2+ pedal pulses, 2+ radial pulses Neuro:alert and oriented, MAE, follows commands, + facial symmetry Tele: SB at 50s NSVT, and PACs.    Lab Results:  Recent Labs  08/26/14 1136 08/27/14 0326  WBC 4.3 5.2  HGB 9.2* 9.0*  HCT 28.8* 27.8*  PLT 65* 58*   BMET  Recent Labs  08/26/14 1136 08/27/14 0326  NA 141 141  K 5.7* 4.8  CL 111 110  CO2 21* 21*  GLUCOSE 132* 101*  BUN 61* 58*  CREATININE 4.01* 3.98*  CALCIUM 8.0* 7.9*    Recent Labs  08/26/14 2226 08/27/14 0326  TROPONINI 0.03 0.03    Lab Results  Component Value Date   CHOL 97 04/25/2013   HDL 33* 04/25/2013   LDLCALC 41 04/25/2013   TRIG 113 04/25/2013   No results found for: HGBA1C   Lab Results  Component Value Date   TSH 1.245 04/03/2013    Hepatic Function Panel No results for input(s): PROT, ALBUMIN, AST, ALT, ALKPHOS, BILITOT, BILIDIR, IBILI in the last 72 hours. No results for input(s): CHOL in the last 72 hours. No results for input(s): PROTIME in the last 72 hours.     Studies/Results: Dg Chest  2 View  08/26/2014   CLINICAL DATA:  69 year old male with chest pain and shortness of Breath. Initial encounter. Dialysis, diabetes, hypertension.  EXAM: CHEST  2 VIEW  COMPARISON:  08/05/2014 and earlier.  FINDINGS: Stable cardiomegaly and mediastinal contours. Stable sequelae of CABG. Interval decreased pulmonary vascularity. No pneumothorax. No definite pleural effusion. No confluent pulmonary opacity. Stable lung volumes. No acute osseous abnormality identified.  IMPRESSION: Resolved vascular congestion since June. No acute cardiopulmonary abnormality.   Electronically  Signed   By: Genevie Ann M.D.   On: 08/26/2014 13:10    Medications: I have reviewed the patient's current medications. Scheduled Meds: . aspirin  324 mg Oral NOW   Or  . aspirin  300 mg Rectal NOW  . aspirin EC  81 mg Oral Daily  . [START ON 08/28/2014] calcitRIOL  0.25 mcg Oral Once per day on Mon Wed Fri  . carvedilol  6.25 mg Oral BID WC  . clopidogrel  75 mg Oral Daily  . hydrALAZINE  50 mg Oral TID  . insulin aspart  0-15 Units Subcutaneous TID WC  . isosorbide mononitrate  60 mg Oral Daily  . omega-3 acid ethyl esters  1 g Oral Daily  . pregabalin  75 mg Oral BID  . ranolazine  500 mg Oral BID  . rosuvastatin  20 mg Oral Daily  . sodium bicarbonate  650 mg Oral BID  . [START ON 08/28/2014] sodium polystyrene  15 g Oral Once per day on Mon Wed Fri   Continuous Infusions:  PRN Meds:.acetaminophen, ALPRAZolam, nitroGLYCERIN, ondansetron (ZOFRAN) IV  Assessment/Plan: . Atypical chest pain - Exertional left sided chest pain, describes as dull and states "not like my heart attack", that associated with SOB. No acute change in EKG. have pt ambulate Troponin neg X 3  But one was 0.04 -D-Dimer 0.75  He does have DOE. Will check VQ   2. Coronary artery disease involving native coronary artery of native heart without angina pectoris: s/p CABG 2001 - Most recent cardiac catheterization on 08/07/2014, the distal left main stenosis and ostial left circumflex stenosis was treated with Synergy DES 2.5 mm x 18 mm with 0% residual. - Continue ASA, Plavix, coreg, imdur--pt not on ranexa as outpt. He did not resume after stent - will restart  3. Ischemic cardiomyopathy - EF 25-30% by echocardiogram during recent hospitalization. Plan for follow-up echo 90 days post PCI. If his EF remains less than 35%, consider referral to EP for ICD. He is not a candidate for ACE inhibitor or ARB secondary to advanced chronic kidney disease.  4. Chronic systolic CHF - He is NYHA 2-2b. He takes torsemide as  needed. He has chronic leg edema. CXR clear.  - Will place on strict I/O and daily weight. +240  5. CKD, stage 4 - per patient seen by nephrologist Friday 7/15 and everything was ok. - K of 5.7. Creatinine 4.01 (was 3.52- 7/2-->3.91-7/5), BUN 61. Today 3.98 and BUN 58  - Continue home medications.  - Daily BMET  6. Palpitation - etiology, placed on holter monitor. (see last office visit note 08/18/14) with NSVT - 4 beats (not unexpected with low we have) - Final result pending. Reviewing report noted PAT, PACs. No significant arrhthymias.   7. Hyperkalemia - K of 5.7--> now 4.8 - He is on Kayexalete 15mg /64ml suspension MWF. Resume home dose. However today will give total of Kayexalete 30mg /28ml.  Cornelius Moras home dose of sodium bicarbonate 650mg   8. Thrombocytopenia - Hemoglobin of  9.2-->9.0 (stable). Platelets 65 (was 82 on 7/2, 71 on 7/1) today 58. - Will get daily CBC.  9. DM- stable glucose 85-199 - SSI  10. Sinus Bradycardia- still with HR to 44 on occ. - Continue to monitor on tele. - Continue home dose of coreg 6.25mg  BID for now -- due to frequent PVCs/PACs      Time spent with pt. :15 minutes. [facility] R  Nurse Practitioner Certified Pager [phone] or after 5pm and on weekends call [phone] 08/27/2014, 10:37 AM  I have seen, examined and evaluated the patient this PM along with Cecilie Kicks, NP-c.  After reviewing all the available data and chart,  I agree with her findings, examination as well as impression recommendations.  I am finally meeting [name] for the first time today after reviewing his chart in detail. He basically describes sensations of exertional angina which is consistent with a supply versus demand ischemia with existing RCA disease. He does have a mildly elevated d-dimer and therefore we will check a VQ scan based on Dr. Jacalyn Lefevre recommendations. For his 8 angina I think he probably also has some diastolic dysfunction mediated increased LV  stress from volume overload. He does have some intermittent edema and some symptoms of PND and orthopnea with angina decubitus. Would restart Ranexa 500 mg twice a day. We will also have him back on his torsemide 20 mg daily with an additional 20 mg if he has an episode of PND/orthopnea or angina decubitus. Also if he notes increased edema.  In addition to adding back torsemide, we will increase his hydralazine for additional afterload reduction.  Not on ACE inhibitor/ARB secondary to renal insufficiency. Using hydralazine/nitrate for afterload reduction.  I don't think he needs a LifeVest basal affect is monitor did not show any prolonged arrhythmias just simply mostly PACs. He has not had any arrhythmias on telemetry here. He should be fine to go back to his cardiac rehabilitation in the [facility].  His potassium is improved after Kayexalate. He does not appear to be on any medications which would increase potassium, and hopefully being on torsemide will help prevent recurrent hyperkalemia.  Provided his VQ scan is negative, I think probably be discharged home. Will arrange close follow-up with Christell Faith, PA-C at the [facility] clinic on the day that I am the on Wednesday. He will then continue to have his follow-up with me in September which should be roughly the time that he has an echocardiogram to reassess his EF.   Leonie Man, M.D., M.S. Interventional Cardiologist   Pager # [phone]

## 2014-08-28 ENCOUNTER — Telehealth: Payer: Self-pay | Admitting: Cardiology

## 2014-08-28 ENCOUNTER — Encounter: Payer: Medicare Other | Admitting: *Deleted

## 2014-08-28 DIAGNOSIS — I214 Non-ST elevation (NSTEMI) myocardial infarction: Secondary | ICD-10-CM | POA: Diagnosis not present

## 2014-08-28 DIAGNOSIS — Z9861 Coronary angioplasty status: Secondary | ICD-10-CM

## 2014-08-28 LAB — GLUCOSE, CAPILLARY: GLUCOSE-CAPILLARY: 120 mg/dL — AB (ref 65–99)

## 2014-08-28 NOTE — Telephone Encounter (Signed)
D/C phone call .[name] Appt on 09/11/14 w/ Christell Faith in [place]   Thanks

## 2014-08-28 NOTE — Progress Notes (Signed)
Daily Session Note  Patient Details  Name: BASIM BARTNIK MRN: [id number] Date of Birth: 1945/07/22 Referring Provider:  Marden Noble, MD  Encounter Date: 08/28/2014  Check In:     Session Check In - 08/28/14 1202    Check-In   Staff Present Heath Lark RN, BSN, CCRP;Carroll Enterkin RN, BSN   ER physicians immediately available to respond to emergencies See telemetry face sheet for immediately available ER MD   Medication changes reported     Yes   Comments tosemide added   Fall or balance concerns reported    No   Warm-up and Cool-down Performed on first and last piece of equipment   VAD Patient? No   Pain Assessment   Currently in Pain? No/denies    Staff present: Hessie Knows BS Exercise Physiologist     Goals Met:  Strength training completed today  Goals Unmet:  Lennex had 1/10 chest pain while on the treadmill today. This resolved after stopping.  Goals Comments: Wednesday after class Coulson went to his physician office in [place], and subsequently went to the ER. He was given diuretics and a new prescription. His weight is down 5 pounds from Wednesday.   Dr. Emily Filbert is Medical Director for [place] and [facility].

## 2014-08-28 NOTE — Telephone Encounter (Signed)
Patient contacted regarding discharge from [facility] on 08/27/14.  Patient understands to follow up with provider Christell Faith, PA on 09/11/14 at 2:30p at [facility]. Patient understands discharge instructions? yes Patient understands medications and regiment? yes Patient understands to bring all medications to this visit? yes

## 2014-08-31 ENCOUNTER — Encounter: Payer: Medicare Other | Admitting: *Deleted

## 2014-08-31 ENCOUNTER — Telehealth: Payer: Self-pay | Admitting: *Deleted

## 2014-08-31 DIAGNOSIS — I214 Non-ST elevation (NSTEMI) myocardial infarction: Secondary | ICD-10-CM | POA: Diagnosis not present

## 2014-08-31 LAB — GLUCOSE, CAPILLARY
GLUCOSE-CAPILLARY: 127 mg/dL — AB (ref 65–99)
Glucose-Capillary: 184 mg/dL — ABNORMAL HIGH (ref 65–99)

## 2014-08-31 NOTE — Progress Notes (Signed)
Daily Session Note  Patient Details  Name: TERRON MERFELD MRN: [id number] Date of Birth: 1945-10-04 Referring Provider:  Leonie Man, MD  Encounter Date: 08/31/2014  Check In:     Session Check In - 08/31/14 0922    Check-In   Staff Present Candiss Norse MS, ACSM CEP Exercise Physiologist;Kelly Alfonso Patten, ACSM CEP Exercise Physiologist;Susanne Bice RN, BSN, [place]   ER physicians immediately available to respond to emergencies See telemetry face sheet for immediately available ER MD   Medication changes reported     No   Fall or balance concerns reported    No   Warm-up and Cool-down Performed on first and last piece of equipment   VAD Patient? No   Pain Assessment   Currently in Pain? No/denies         Goals Met:  Independence with exercise equipment Exercise tolerated well Personal goals reviewed No report of cardiac concerns or symptoms Strength training completed today  Goals Unmet:  Not Applicable  Goals Comments: Kadan was able to walk on the treadmill today without chest pain or cardiac symptoms. He was even able to increase his speed up to 2.4 mph   Dr. Emily Filbert is Medical Director for [place] and [facility].

## 2014-08-31 NOTE — Telephone Encounter (Signed)
pt notified of monitor results by phone with verbal understanding. Pt advies to keep appt with Christell Faith, PA. Pt agreeable to plan of care.

## 2014-09-02 DIAGNOSIS — I214 Non-ST elevation (NSTEMI) myocardial infarction: Secondary | ICD-10-CM | POA: Diagnosis not present

## 2014-09-02 NOTE — Progress Notes (Signed)
Daily Session Note  Patient Details  Name: Joseph [name] MRN: [id number] Date of Birth: 1946/01/21 Referring Provider:  Leonie Man, MD  Encounter Date: 09/02/2014  Check In:     Session Check In - 09/02/14 0942    Check-In   Staff Present Heath Lark RN, BSN, CCRP;Liberty Seto BS, ACSM EP-C, Exercise Physiologist;Renee Dillard Essex MS, ACSM CEP Exercise Physiologist   ER physicians immediately available to respond to emergencies See telemetry face sheet for immediately available ER MD   Medication changes reported     No   Fall or balance concerns reported    No   Warm-up and Cool-down Performed on first and last piece of equipment   VAD Patient? No   Pain Assessment   Currently in Pain? No/denies         Goals Met:  Proper associated with RPD/PD & O2 Sat Exercise tolerated well Strength training completed today  Goals Unmet:  Not Applicable  Goals Comments: [name] did experience some chest pain during his exercise, and it was alleviated with reduced workloads.  Dr. Emily Filbert is Medical Director for [place] and [facility].

## 2014-09-04 DIAGNOSIS — Z9861 Coronary angioplasty status: Secondary | ICD-10-CM

## 2014-09-04 DIAGNOSIS — I214 Non-ST elevation (NSTEMI) myocardial infarction: Secondary | ICD-10-CM | POA: Diagnosis not present

## 2014-09-04 NOTE — Progress Notes (Signed)
Daily Session Note  Patient Details  Name: Joseph [name] MRN: [id number] Date of Birth: 10/12/1945 Referring Provider:  Harding, David W, MD  Encounter Date: 09/04/2014  Check In:     Session Check In - 09/04/14 0857    Check-In   Staff Present   RN, BSN, CCRP;Carroll Enterkin RN, BSN;Other   ER physicians immediately available to respond to emergencies See telemetry face sheet for immediately available ER MD   Medication changes reported     No   Fall or balance concerns reported    No   Warm-up and Cool-down Performed on first and last piece of equipment   VAD Patient? No   Pain Assessment   Currently in Pain? No/denies         Goals Met:  Independence with exercise equipment Exercise tolerated well No report of cardiac concerns or symptoms Strength training completed today  Goals Unmet:  Not Applicable  Goals Comments: Doing well with exercise prescription progression.    Dr. Mark Miller is Medical Director for [facility] and [facility]. 

## 2014-09-07 ENCOUNTER — Encounter: Payer: Medicare Other | Attending: Cardiology | Admitting: *Deleted

## 2014-09-07 DIAGNOSIS — I214 Non-ST elevation (NSTEMI) myocardial infarction: Secondary | ICD-10-CM | POA: Diagnosis present

## 2014-09-07 DIAGNOSIS — Z9861 Coronary angioplasty status: Secondary | ICD-10-CM | POA: Insufficient documentation

## 2014-09-07 NOTE — Progress Notes (Signed)
Daily Session Note  Patient Details  Name: Joseph [name] MRN: [id number] Date of Birth: Sep 08, 1945 Referring Provider:  Leonie Man, MD  Encounter Date: 09/07/2014  Check In:      Goals Met:  Independence with exercise equipment Exercise tolerated well  Goals Unmet:  Angina symtpms reported, resolved by slowing down.  Goals Comments: Spoke with [name] about working below the angina pain threshold. He stated understanding of keeping his workload below the level that creates his angina. Will continue to follow for signs/symptoms.    Dr. Emily Filbert is Medical Director for [place] and [facility].

## 2014-09-09 DIAGNOSIS — Z9861 Coronary angioplasty status: Secondary | ICD-10-CM

## 2014-09-09 DIAGNOSIS — I214 Non-ST elevation (NSTEMI) myocardial infarction: Secondary | ICD-10-CM | POA: Diagnosis not present

## 2014-09-09 NOTE — Progress Notes (Signed)
Daily Session Note  Patient Details  Name: Joseph [name] MRN: [id number] Date of Birth: 06/14/1945 Referring Provider:  Harding, David W, MD  Encounter Date: 09/09/2014  Check In:     Session Check In - 09/09/14 0915    Check-In   Staff Present Susanne Bice RN, BSN, CCRP;  BS, ACSM EP-C, Exercise Physiologist;Renee MacMillan MS, ACSM CEP Exercise Physiologist   ER physicians immediately available to respond to emergencies See telemetry face sheet for immediately available ER MD   Medication changes reported     No   Fall or balance concerns reported    No   Warm-up and Cool-down Performed on first and last piece of equipment   VAD Patient? No   Pain Assessment   Currently in Pain? No/denies           Exercise Prescription Changes - 09/09/14 0900    Exercise Review   Progression Yes   Response to Exercise   Duration Progress to 30 minutes of continuous aerobic without signs/symptoms of physical distress   Intensity Rest + 30   Progression Continue progressive overload as per policy without signs/symptoms or physical distress.   Resistance Training   Training Prescription Yes   Weight 3   Reps 10-15   Interval Training   Interval Training No   Treadmill   MPH 2.4   Grade 0   Minutes 15      Goals Met:  Proper associated with RPD/PD & O2 Sat Exercise tolerated well No report of cardiac concerns or symptoms Strength training completed today  Goals Unmet:  Not Applicable  Goals Comments:    Dr. Mark Miller is Medical Director for [facility] and [facility]. 

## 2014-09-11 ENCOUNTER — Ambulatory Visit (INDEPENDENT_AMBULATORY_CARE_PROVIDER_SITE_OTHER): Payer: Medicare Other | Admitting: Physician Assistant

## 2014-09-11 ENCOUNTER — Telehealth: Payer: Self-pay | Admitting: Cardiovascular Disease

## 2014-09-11 ENCOUNTER — Encounter: Payer: Medicare Other | Admitting: *Deleted

## 2014-09-11 ENCOUNTER — Encounter: Payer: Self-pay | Admitting: Physician Assistant

## 2014-09-11 VITALS — BP 140/62 | HR 70 | Ht 70.0 in | Wt 218.2 lb

## 2014-09-11 DIAGNOSIS — R0789 Other chest pain: Secondary | ICD-10-CM

## 2014-09-11 DIAGNOSIS — I255 Ischemic cardiomyopathy: Secondary | ICD-10-CM

## 2014-09-11 DIAGNOSIS — E1122 Type 2 diabetes mellitus with diabetic chronic kidney disease: Secondary | ICD-10-CM

## 2014-09-11 DIAGNOSIS — I2510 Atherosclerotic heart disease of native coronary artery without angina pectoris: Secondary | ICD-10-CM | POA: Diagnosis not present

## 2014-09-11 DIAGNOSIS — I214 Non-ST elevation (NSTEMI) myocardial infarction: Secondary | ICD-10-CM | POA: Diagnosis not present

## 2014-09-11 DIAGNOSIS — R079 Chest pain, unspecified: Secondary | ICD-10-CM

## 2014-09-11 DIAGNOSIS — N189 Chronic kidney disease, unspecified: Secondary | ICD-10-CM

## 2014-09-11 DIAGNOSIS — Z9861 Coronary angioplasty status: Secondary | ICD-10-CM

## 2014-09-11 DIAGNOSIS — I5022 Chronic systolic (congestive) heart failure: Secondary | ICD-10-CM

## 2014-09-11 DIAGNOSIS — I10 Essential (primary) hypertension: Secondary | ICD-10-CM

## 2014-09-11 DIAGNOSIS — D696 Thrombocytopenia, unspecified: Secondary | ICD-10-CM | POA: Diagnosis not present

## 2014-09-11 DIAGNOSIS — D649 Anemia, unspecified: Secondary | ICD-10-CM | POA: Diagnosis not present

## 2014-09-11 DIAGNOSIS — N184 Chronic kidney disease, stage 4 (severe): Secondary | ICD-10-CM

## 2014-09-11 MED ORDER — RANOLAZINE ER 1000 MG PO TB12: 1000.0000 mg | ORAL_TABLET | Freq: Two times a day (BID) | ORAL | Status: DC

## 2014-09-11 MED ORDER — ISOSORBIDE MONONITRATE ER 60 MG PO TB24: 90.0000 mg | ORAL_TABLET | Freq: Every day | ORAL | Status: DC

## 2014-09-11 NOTE — Progress Notes (Signed)
Daily Session Note  Patient Details  Name: Joseph [name] MRN: [id number] Date of Birth: 1945-03-22 Referring Provider:  Leonie Man, MD  Encounter Date: 09/11/2014  Check In:     Session Check In - 09/11/14 0902    Check-In   Staff Present Gerlene Burdock RN, BSN;Awad Gladd RN, BSN, CCRP;Renee Dillard Essex MS, ACSM CEP Exercise Physiologist   ER physicians immediately available to respond to emergencies See telemetry face sheet for immediately available ER MD   Medication changes reported     Yes   Fall or balance concerns reported    No   Warm-up and Cool-down Performed on first and last piece of equipment   VAD Patient? No   Pain Assessment   Currently in Pain? No/denies   Multiple Pain Sites No         Goals Met:  Independence with exercise equipment Exercise tolerated well Strength training completed today  Goals Unmet:  Continues with anginal symptoms/Will slow down or stop to resolve the symptoms. Work at lower  workload to keep symptoms comtrolled.  Goals Comments: Doing well with exercise prescription progression. Continues to work with the exercise and keeping angina symptoms controlled. To see his doctor today and will review symptoms with his doctor.    Dr. Emily Filbert is Medical Director for [place] and [facility].

## 2014-09-11 NOTE — Telephone Encounter (Signed)
Pt c/o of Chest Pain: STAT if CP now or developed within 24 hours  1. Are you having CP right now? no  2. Are you experiencing any other symptoms (ex. SOB, nausea, vomiting, sweating)? No   3. How long have you been experiencing CP? On treadmill only at rehab   4. Is your CP continuous or coming and going? Only on treadmill at rehab   5. Have you taken Nitroglycerin? No  ?

## 2014-09-11 NOTE — Progress Notes (Signed)
Cardiology Hospital Follow Up Note:   Date of Encounter: 09/11/2014  ID: Joseph [name], DOB [birth year], MRN [id number]  PCP: Marden Noble, MD Primary Cardiologist: Dr. Ellyn Hack, MD  Chief Complaint  Patient presents with  . other    Follow up from cardiac cath & stent placement, holter monitor and echo. Pt. c/o when walking on the treadmill at 2.5 grade has chest pain .  Meds reviewed by the patient verbally.      HPI:  69 year old male with history of CAD status post CABG in 2001 at [facility] (LIMA-LAD, SVG-D2, SVG-OM1, SVG-right PDA, SVG-right PL1) and prior PCI, ischemic cardiomyopathy/chronic systolic CHF with EF 23-55% by echo 08/06/2014, palpitations, CKD stage IV (previously treated with dialysis), chronic thrombocytopenia, and prostate CA who presents for hospital follow up from recent admission to [facility] from 7/20 to 7/21 for atypical chest pain occuring at cardiac rehab.   Patient underwent cardiac catheterization with Dr. Humphrey Rolls in [facility] 07/30/2014. This demonstrated all vein grafts occluded, known since 2009. LIMA to the LAD was patent. There was a 70% ostial left main lesion, 70% ostial LCx and an occluded RCA. The patient's case was discussed with Dr. Terrence Dupont for consideration of PCI. Because transitioning care to Dr. Ellyn Hack in [place], we assumed his care in June 2016. Echocardiogram on 08/06/2014 demonstrated severely reduced LV function with an EF of 25-30%, multiple wall motion abnormalities, moderate diastolic dysfunction and moderately elevated pulmonary pressures with a PASP 49 mmHg and moderately reduced RV function. He was seen by nephrology who recommended IV fluids and minimal contrast dye. The patient underwent cardiac catheterization with Dr. Gwenlyn Found 08/07/14. He was noted to have critical left main disease and ostial left circumflex disease, both 90% stenosed. Both were treated with a Synergy DES. Post hospitalization he did well. He did not resume his  Ranexa post PCI.    He was admitted again on 7/20 for chest pain at cardiac rehab - also had palpitations and had Holter monitor X 48 hours, with PACs, burst of PAT and brady to the 40s. No VT. EKG in ER sinus brady to 47 and up to low 50s. His bradycardia precluded further titation of his beta blocker. The patient was admitted and ruled out for an MI. His D-dimer was elevated and it was felt he should have a VQ which was done 08/27/14 and was low risk for PE. His Hydralyzine was increased for afterload reduction. Torsemide continued on a daily schedule. He was instructed to start taking Ranexa as previously ordered.   Today he states he is doing well. He does have some exertional chest pain when on the treadmill at 2/5 grade, though he can ride his bike for extended time periods (>86minutie) without any issues. He would like to know if there is any further medical management he can do for this. He wants to get back to playing "pickle-ball." His weight is stable. Trace non-pitting edema along the shins is his baseline. No orthopnea or PND. His palpitations have resolved. He is overall pleased with his progress.     Past Medical History  Diagnosis Date  . Hypertension   . Hypercholesterolemia   . Prostate cancer   . Enlarged liver   . Spleen enlarged   . Thrombocytopenia   . Myocardial infarction   . Anxiety   . Depression   . Shortness of breath   . Diabetes mellitus without complication     INSULIN DEPENDENT  . GERD (gastroesophageal  reflux disease)   . History of shingles   . Ischemic cardiomyopathy     a. echo 08/06/2014: EF 25-30%, multiple WMA, mod DD, PASP 49 mm Hg   . CKD (chronic kidney disease), stage IV     a. previously on dialysis; b. followed by Dr. Abigail Butts   . CAD (coronary artery disease)     a. s/p 4v CABG 2001(LIMA-LAD, SVG-D2, SVG-OM1, SVG-right PDA, SVG-right PL1); b. cath 07/30/2014 vein grafts all down, patent LIMA to LAD, significant LM and ost LCx dx; c. cath 08/07/2014  Synergy DES 2.5 mm x 18 mm to dist LM and ost LCx, rec lifelong DAPT  . Chronic systolic CHF (congestive heart failure)     a. echo 08/2013: EF 40-45%, impaired relaxation, mild MR, LA mildly dilated,    : Past Surgical History  Procedure Laterality Date  . Cardiac surgery    . Shoulder arthroscopy    . Penile prosthesis implant    . Myocardial infarction    . Cardiac catheterization    . Coronary angioplasty    . Coronary artery bypass graft  2001  . Colonoscopy    . Liver biopsy    . Av fistula placement Left 2015  . Cardiac catheterization N/A 07/30/2014    Procedure: Left Heart Cath and Cors/Grafts Angiography;  Surgeon: Dionisio David, MD;  Location: [facility];  Service: Cardiovascular;  Laterality: N/A;  . Cardiac catheterization N/A 08/07/2014    Procedure: Coronary Stent Intervention;  Surgeon: Lorretta Harp, MD;  Location: [facility];  Service: Cardiovascular;  Laterality: N/A;  : Family History  Problem Relation Age of Onset  . Acute myelogenous leukemia Brother   . CAD Brother   . Diabetes Mellitus II Brother   . Heart disease Father 85    CABG  :  reports that he quit smoking about 6 years ago. He has quit using smokeless tobacco. He reports that he does not drink alcohol or use illicit drugs.:   Allergies:  Allergies  Allergen Reactions  . Sulfa Antibiotics Itching    hives     Home Medications:  Current Outpatient Prescriptions  Medication Sig Dispense Refill  . acetaminophen (TYLENOL) 325 MG tablet Take 2 tablets (650 mg total) by mouth every 4 (four) hours as needed for headache or mild pain.    [name] ALPRAZolam (XANAX) 0.5 MG tablet Take 0.5 mg by mouth at bedtime as needed for anxiety or sleep.     [name] aspirin 81 MG tablet Take 81 mg by mouth daily.    [name] BEE POLLEN PO Take by mouth daily. 1 tsp    . calcitRIOL (ROCALTROL) 0.25 MCG capsule Take 0.25 mcg by mouth 3 (three) times a week. On Monday, Wednesday, and Friday    . carvedilol  (COREG) 12.5 MG tablet Take 0.5 tablets (6.25 mg total) by mouth 2 (two) times daily with a meal. 60 tablet 2  . carvedilol (COREG) 6.25 MG tablet     . clopidogrel (PLAVIX) 75 MG tablet Take 1 tablet (75 mg total) by mouth daily. 90 tablet 3  . CRESTOR 10 MG tablet     . hydrALAZINE (APRESOLINE) 25 MG tablet Take 3 tablets (75 mg total) by mouth 3 (three) times daily. 300 tablet 11  . hydrALAZINE (APRESOLINE) 50 MG tablet     . isosorbide mononitrate (IMDUR) 30 MG 24 hr tablet   0  . isosorbide mononitrate (IMDUR) 60 MG 24 hr tablet Take 1 tablet (60  mg total) by mouth daily. 30 tablet 11  . LANTUS SOLOSTAR 100 UNIT/ML Solostar Pen Inject 20 Units into the skin at bedtime.     [name] LYRICA 75 MG capsule Take 75 mg by mouth 2 (two) times daily.   0  . nitroGLYCERIN (NITROLINGUAL) 0.4 MG/SPRAY spray Place 1 spray under the tongue every 5 (five) minutes x 3 doses as needed for chest pain. Do not use together with sublingual nitro 12 g 5  . NITROSTAT 0.4 MG SL tablet place 1 tablet under the tongue if needed every 5 minutes for che...  (REFER TO PRESCRIPTION NOTES).  0  . Omega-3 Fatty Acids (FISH OIL) 1200 MG CAPS Take 1 capsule by mouth daily.    [name] RANEXA 500 MG 12 hr tablet Take 1 tablet (500 mg total) by mouth 2 (two) times daily. 60 tablet 11  . rosuvastatin (CRESTOR) 20 MG tablet Take 20 mg by mouth daily.    . sodium bicarbonate 650 MG tablet Take 650 mg by mouth 2 (two) times daily.     . sodium polystyrene (KAYEXALATE) 15 GM/60ML suspension Take 15 g by mouth 3 (three) times a week. Mon / Wed / Fri    . Spirulina 500 MG TABS Take 1 tablet by mouth daily.    [name] torsemide (DEMADEX) 20 MG tablet Take 1 tablet (20 mg total) by mouth daily. 30 tablet 11   No current facility-administered medications for this visit.     Review of Systems:  Review of Systems  Constitutional: Negative for fever, chills, weight loss, malaise/fatigue and diaphoresis.  HENT: Negative for congestion.   Eyes:  Negative for blurred vision, discharge and redness.  Respiratory: Negative for cough, hemoptysis, sputum production, shortness of breath and wheezing.   Cardiovascular: Positive for chest pain. Negative for palpitations, orthopnea, claudication, leg swelling and PND.  Gastrointestinal: Negative for heartburn, nausea, vomiting, abdominal pain, blood in stool and melena.  Genitourinary: Negative for hematuria.  Musculoskeletal: Negative for myalgias, back pain, joint pain, falls and neck pain.  Skin: Negative for rash.  Neurological: Negative for dizziness, tingling, tremors, sensory change, speech change, focal weakness, weakness and headaches.  Endo/Heme/Allergies: Does not bruise/bleed easily.  Psychiatric/Behavioral: The patient is not nervous/anxious.   All other systems reviewed and are negative.    Physical Exam:  Height 5\' 10"  (1.778 m), weight 218 lb 4 oz (98.998 kg). BMI: Body mass index is 31.32 kg/(m^2). General: Pleasant, NAD. Psych: Normal affect. Responds to questions with normal affect.  Neuro: Alert and oriented X 3. Moves all extremities spontaneously. HEENT: Normocephalic, atraumatic. EOM intact bilaterally. Sclera anicteric.  Neck: Trachea midline. Supple without bruits or JVD. Lungs:  Respirations regular and unlabored, CTA bilaterally without wheezing, crackles, or rhonchi.  Heart: RRR, normal s3, s4. SEM. No rubs, or gallops.  Abdomen: Soft, non-tender, non-distended, BS + x 4.  Extremities: No clubbing or cyanosis. Trace non-pitting edema to the bilateral mid calves. DP/PT/Radials 2+ and equal bilaterally.   Accessory Clinical Findings:  EKG: NSR, 70 bpm, inferior Q waves, lateral TWI   Recent Labs: 08/05/2014: B Natriuretic Peptide 1301.3* 08/06/2014: ALT 13* 08/26/2014: Magnesium 1.7 08/27/2014: BUN 58*; Creatinine, Ser 3.98*; Hemoglobin 9.0*; Platelets 58*; Potassium 4.8; Sodium 141     Component Value Date/Time   CHOL 97 04/25/2013 0532   TRIG 113  04/25/2013 0532   HDL 33* 04/25/2013 0532   VLDL 23 04/25/2013 0532   LDLCALC 41 04/25/2013 0532    Weights: Wt Readings from Last 3 Encounters:  09/11/14 218 lb 4 oz (98.998 kg)  08/27/14 217 lb 8 oz (98.657 kg)  08/26/14 220 lb (99.791 kg)    Estimated Creatinine Clearance: 21.5 mL/min (by C-G formula based on Cr of 3.98).   Other studies Reviewed: Additional studies/ records that were reviewed today include: prior office note, studies, and [facility] admissions.  Assessment & Plan:  1. CAD involving native coronary arteries without angina pertoris:  -Some exertional chest pain only with treadmill, not with bike riding for 30+ minutes   -Continue dual antiplatelet Rx with ASA and Plavix indefinately.We discussed the importance of dual antiplatelet therapy -Increase Imdur to 90 mg daily and Ranexa to 1000 mg bid  -Continue beta-blocker, hydralazine, nitrates, statin  -If he continues to have symptoms could increase beta blocker if BP allows if follow up  -He is not on ACE inhibitor or angiotensin receptor blocker due to advanced CKD  -Continue Cardiac Rehab and active lifestyle  -P2Y12 ok   2. Ischemic cardiomyopathy:  -EF 25-30% by echocardiogram on 08/06/2014 during recent hospitalization  -He will need a follow-up echo 90 days post PCI, approximately 11/06/2014. This can be scheduled at his follow-up with Dr. Ellyn Hack in September  Continue beta blocker, hydralazine, nitrates. He is not a candidate for ACE inhibitor or ARB secondary to advanced chronic kidney disease  -If his EF remains less than 35%, consider referral to EP for ICD   3. Chronic systolic CHF:  -NYHA 6-9G  -Torsemide 20 mg with an extra prn SOB/PND   4. CKD stage IV:  -Followed by nephrology  -Bmet, last SCr 3.98 on 7/21   5. Thrombocytopenia:  -CBC, last platelt count 58 on 7/21   6. Palpitations:  -Recent Holter monitor that showed some SVT, no VT  -Heart rate has been bradycardic on Holter which has  precluded further titration of his beta blocker  -No further symptoms  7. History of prostate cancer:  -Per primary  Dispo: -Has follow up scheduled with Dr. Ellyn Hack, MD on 9/7 -Schedule echo for late September as above  Current medicines are reviewed at length with the patient today.  The patient did not have any concerns regarding medicines.   Christell Faith, PA-C [facility] [address] [place], [place] [address] [phone] [place] 09/11/2014, 2:24 PM

## 2014-09-11 NOTE — Telephone Encounter (Signed)
Pt is in the office for appt.

## 2014-09-11 NOTE — Patient Instructions (Signed)
Medication Instructions:  Please increase Imdur to 90 mg once daily Please start Ranexa 1000 mg twice daily  Labwork: BMET, CBC  Testing/Procedures: Your physician has requested that you have an echocardiogram at the end of September. Echocardiography is a painless test that uses sound waves to create images of your heart. It provides your doctor with information about the size and shape of your heart and how well your heart's chambers and valves are working. This procedure takes approximately one hour. There are no restrictions for this procedure.  Follow-Up: Please keep your follow up w/ Dr. Ellyn Hack  Any Other Special Instructions Will Be Listed Below (If Applicable).    Echocardiogram An echocardiogram, or echocardiography, uses sound waves (ultrasound) to produce an image of your heart. The echocardiogram is simple, painless, obtained within a short period of time, and offers valuable information to your health care provider. The images from an echocardiogram can provide information such as:  Evidence of coronary artery disease (CAD).  Heart size.  Heart muscle function.  Heart valve function.  Aneurysm detection.  Evidence of a past heart attack.  Fluid buildup around the heart.  Heart muscle thickening.  Assess heart valve function. LET [facility] CARE PROVIDER KNOW ABOUT:  Any allergies you have.  All medicines you are taking, including vitamins, herbs, eye drops, creams, and over-the-counter medicines.  Previous problems you or members of your family have had with the use of anesthetics.  Any blood disorders you have.  Previous surgeries you have had.  Medical conditions you have.  Possibility of pregnancy, if this applies. BEFORE THE PROCEDURE  No special preparation is needed. Eat and drink normally.  PROCEDURE   In order to produce an image of your heart, gel will be applied to your chest and a wand-like tool (transducer) will be moved over your  chest. The gel will help transmit the sound waves from the transducer. The sound waves will harmlessly bounce off your heart to allow the heart images to be captured in real-time motion. These images will then be recorded.  You may need an IV to receive a medicine that improves the quality of the pictures. AFTER THE PROCEDURE You may return to your normal schedule including diet, activities, and medicines, unless your health care provider tells you otherwise. Document Released: 01/21/2000 Document Revised: 06/09/2013 Document Reviewed: 09/30/2012 [facility] Patient Information 2015 [place], [place]. This information is not intended to replace advice given to you by your health care provider. Make sure you discuss any questions you have with your health care provider.

## 2014-09-11 NOTE — Progress Notes (Signed)
Daily Session Note  Patient Details  Name: Joseph [name] MRN: [id number] Date of Birth: [birth year] Referring Provider:  Leonie Man, MD  Encounter Date: 09/11/2014  Check In:     Session Check In - 09/11/14 0902    Check-In   Staff Present Gerlene Burdock RN, BSN;Susanne Bice RN, BSN, CCRP;Lacey Wallman Dillard Essex MS, ACSM CEP Exercise Physiologist   ER physicians immediately available to respond to emergencies See telemetry face sheet for immediately available ER MD   Medication changes reported     Yes   Fall or balance concerns reported    No   Warm-up and Cool-down Performed on first and last piece of equipment   VAD Patient? No   Pain Assessment   Currently in Pain? No/denies   Multiple Pain Sites No         Goals Met:  Independence with exercise equipment Exercise tolerated well Personal goals reviewed No report of cardiac concerns or symptoms Strength training completed today  Goals Unmet:  Not Applicable  Goals Comments:  Dr. Emily Filbert is Medical Director for [place] and [facility].

## 2014-09-12 LAB — CBC WITH DIFFERENTIAL/PLATELET
BASOS: 0 %
Basophils Absolute: 0 10*3/uL (ref 0.0–0.2)
EOS (ABSOLUTE): 0.1 10*3/uL (ref 0.0–0.4)
Eos: 1 %
HEMOGLOBIN: 9.2 g/dL — AB (ref 12.6–17.7)
Hematocrit: 28.9 % — ABNORMAL LOW (ref 37.5–51.0)
Immature Grans (Abs): 0 10*3/uL (ref 0.0–0.1)
Immature Granulocytes: 0 %
LYMPHS: 10 %
Lymphocytes Absolute: 0.6 10*3/uL — ABNORMAL LOW (ref 0.7–3.1)
MCH: 27.9 pg (ref 26.6–33.0)
MCHC: 31.8 g/dL (ref 31.5–35.7)
MCV: 88 fL (ref 79–97)
MONOCYTES: 8 %
Monocytes Absolute: 0.5 10*3/uL (ref 0.1–0.9)
NEUTROS PCT: 81 %
Neutrophils Absolute: 5.2 10*3/uL (ref 1.4–7.0)
Platelets: 81 10*3/uL — CL (ref 150–379)
RBC: 3.3 x10E6/uL — AB (ref 4.14–5.80)
RDW: 14.8 % (ref 12.3–15.4)
WBC: 6.5 10*3/uL (ref 3.4–10.8)

## 2014-09-12 LAB — BASIC METABOLIC PANEL
BUN/Creatinine Ratio: 14 (ref 10–22)
BUN: 57 mg/dL — ABNORMAL HIGH (ref 8–27)
CALCIUM: 7.8 mg/dL — AB (ref 8.6–10.2)
CO2: 22 mmol/L (ref 18–29)
CREATININE: 4.15 mg/dL — AB (ref 0.76–1.27)
Chloride: 102 mmol/L (ref 97–108)
GFR, EST AFRICAN AMERICAN: 16 mL/min/{1.73_m2} — AB (ref >59)
GFR, EST NON AFRICAN AMERICAN: 14 mL/min/{1.73_m2} — AB (ref >59)
Glucose: 200 mg/dL — ABNORMAL HIGH (ref 65–99)
POTASSIUM: 4 mmol/L (ref 3.5–5.2)
Sodium: 143 mmol/L (ref 134–144)

## 2014-09-14 ENCOUNTER — Telehealth: Payer: Self-pay

## 2014-09-14 ENCOUNTER — Encounter: Payer: Medicare Other | Admitting: *Deleted

## 2014-09-14 DIAGNOSIS — Z9861 Coronary angioplasty status: Secondary | ICD-10-CM

## 2014-09-14 DIAGNOSIS — I214 Non-ST elevation (NSTEMI) myocardial infarction: Secondary | ICD-10-CM | POA: Diagnosis not present

## 2014-09-14 NOTE — Progress Notes (Signed)
Daily Session Note  Patient Details  Name: Joseph [name] MRN: [id number] Date of Birth: [birth year] Referring Provider:  Marden Noble, MD  Encounter Date: 09/14/2014  Check In:     Session Check In - 09/14/14 0835    Check-In   Staff Present Candiss Norse MS, ACSM CEP Exercise Physiologist;Kelly Alfonso Patten, ACSM CEP Exercise Physiologist;Susanne Bice RN, BSN, [place]   ER physicians immediately available to respond to emergencies See telemetry face sheet for immediately available ER MD   Medication changes reported     No   Fall or balance concerns reported    No   Warm-up and Cool-down Performed on first and last piece of equipment   VAD Patient? No   Pain Assessment   Currently in Pain? No/denies   Multiple Pain Sites No         Goals Met:  Independence with exercise equipment Exercise tolerated well No report of cardiac concerns or symptoms  Goals Unmet:  Not Applicable  Goals Comments: [name] had to leave early today and did not complete the strength training. He was able to get his aerobic exercise in and was cleared to play pickle ball by his doctor. He is going to try to play light pickle ball today.    Dr. Emily Filbert is Medical Director for [place] and [facility].

## 2014-09-14 NOTE — Telephone Encounter (Signed)
Left message on machine for patient to contact the office.   

## 2014-09-14 NOTE — Telephone Encounter (Signed)
Pt c/o Shortness Of Breath: STAT if SOB developed within the last 24 hours or pt is noticeably SOB on the phone  1. Are you currently SOB (can you hear that pt is SOB on the phone)? yes  2. How long have you been experiencing SOB? 1-2 months  3. Are you SOB when sitting or when up moving around? Just when walking, or moving real fast, like on the treadmill, states he is going to try and play pickle ball this afternoon  4. Are you currently experiencing any other symptoms? Chest pain when he walks on the treadmill(which he did this morning)

## 2014-09-15 ENCOUNTER — Encounter: Payer: Self-pay | Admitting: *Deleted

## 2014-09-15 NOTE — Telephone Encounter (Signed)
S/w pt regarding Ryan's recommendations. Pt stated he will continue increased dose of imdur and ranexa, monitor activities and call if CP continues. Confirmed 9/7 OV with Dr. Ellyn Hack and 9/30 echo.

## 2014-09-15 NOTE — Telephone Encounter (Signed)
None of this is new information from his last admission at [facility] or his office visit. His medications were just changed/titrated. Give it some time. He will continue to feel SOB his EF is likely still low, last known EF 25% (normal 55%). Plans to repeat echo on 9/30 which is 3 months post MI to evaluate for ICD. Like I told him at his OV it is good to be active. He needs to advance as tolerated and not jump right back in. We can titrate medications as tolerated (currently on max dose Ranexa), could go up on Imdur BP allowing. Should he continue to be symptomatic could consider stress testing.

## 2014-09-15 NOTE — Telephone Encounter (Signed)
S/w pt who states he has been SOB for a month or two. Was able to play pickle ball yesterday but couldn't keep up with the men so he played it with the women. States he has some chest pain, 7/10,  when walking 10-15 min on the treadmill in Cardiac Rehab. Usually stops when pain is 3/10, rests, then starts again until CP again. At that point, he switched over to bike. States he is able to ride the exercise bike for 30 min without any pain.  Gets SOB when walking back to vehicle from Cardiac Rehab.  Pt s/p PCI at [facility] on 7/1 At 8/5 OV with Christell Faith, imdur and ranexa increased. Pt states he has been taking new dosage but still experiencing some CP. Denies nausea, diaphoresis, or left arm pain. States until he hears back from [place], he will just ride exercise bike at rehab. Forward to Dr. Ellyn Hack and Christell Faith

## 2014-09-16 DIAGNOSIS — I214 Non-ST elevation (NSTEMI) myocardial infarction: Secondary | ICD-10-CM

## 2014-09-16 NOTE — Progress Notes (Signed)
Daily Session Note  Patient Details  Name: Joseph [name] MRN: [id number] Date of Birth: 1945/02/20 Referring Provider:  Marden Noble, MD  Encounter Date: 09/16/2014  Check In:     Session Check In - 09/16/14 0846    Check-In   Staff Present Heath Lark RN, BSN, CCRP;Shamieka Gullo BS, ACSM EP-C, Exercise Physiologist;Renee Dillard Essex MS, ACSM CEP Exercise Physiologist   ER physicians immediately available to respond to emergencies See telemetry face sheet for immediately available ER MD   Medication changes reported     No   Fall or balance concerns reported    No   Warm-up and Cool-down Performed on first and last piece of equipment   VAD Patient? No   Pain Assessment   Currently in Pain? No/denies         Goals Met:  Proper associated with RPD/PD & O2 Sat Exercise tolerated well No report of cardiac concerns or symptoms Strength training completed today  Goals Unmet:  Not Applicable  Goals Comments: Patient was experiencing chest pain while exercising. Their chest pain was resolved when they stopped activity and they were encouraged to rest and to resume exercise at a lighter intensity. Chest pain also resolved when exercising at a lighter intensity.   Dr. Emily Filbert is Medical Director for [place] and [facility].

## 2014-09-17 ENCOUNTER — Encounter: Payer: Self-pay | Admitting: *Deleted

## 2014-09-17 DIAGNOSIS — Z9861 Coronary angioplasty status: Secondary | ICD-10-CM

## 2014-09-17 DIAGNOSIS — I214 Non-ST elevation (NSTEMI) myocardial infarction: Secondary | ICD-10-CM

## 2014-09-17 NOTE — Progress Notes (Signed)
Cardiac Individual Treatment Plan  Patient Details  Name: Joseph [name] MRN: [id number] Date of Birth: Mar 04, 1945 Referring Provider:  Leonie Man, MD  Initial Encounter Date:    Visit Diagnosis: NSTEMI (non-ST elevated myocardial infarction)  S/P PTCA (percutaneous transluminal coronary angioplasty)  Patient's Home Medications on Admission:  Current outpatient prescriptions:  .  aspirin 81 MG tablet, Take 81 mg by mouth daily., Disp: , Rfl:  .  BEE POLLEN PO, Take by mouth daily. 1 tsp, Disp: , Rfl:  .  calcitRIOL (ROCALTROL) 0.25 MCG capsule, Take 0.25 mcg by mouth daily. , Disp: , Rfl:  .  carvedilol (COREG) 12.5 MG tablet, Take 0.5 tablets (6.25 mg total) by mouth 2 (two) times daily with a meal. (Patient taking differently: Take 12.5 mg by mouth 2 (two) times daily with a meal. ), Disp: 60 tablet, Rfl: 2 .  clopidogrel (PLAVIX) 75 MG tablet, Take 1 tablet (75 mg total) by mouth daily., Disp: 90 tablet, Rfl: 3 .  hydrALAZINE (APRESOLINE) 25 MG tablet, Take 3 tablets (75 mg total) by mouth 3 (three) times daily., Disp: 300 tablet, Rfl: 11 .  isosorbide mononitrate (IMDUR) 60 MG 24 hr tablet, Take 1.5 tablets (90 mg total) by mouth daily., Disp: 45 tablet, Rfl: 6 .  LANTUS SOLOSTAR 100 UNIT/ML Solostar Pen, Inject 20 Units into the skin at bedtime. , Disp: , Rfl:  .  LYRICA 75 MG capsule, Take 75 mg by mouth 2 (two) times daily. , Disp: , Rfl: 0 .  nitroGLYCERIN (NITROLINGUAL) 0.4 MG/SPRAY spray, Place 1 spray under the tongue every 5 (five) minutes x 3 doses as needed for chest pain. Do not use together with sublingual nitro (Patient not taking: Reported on 09/16/2014), Disp: 12 g, Rfl: 5 .  NITROSTAT 0.4 MG SL tablet, place 1 tablet under the tongue if needed every 5 minutes for che...  (REFER TO PRESCRIPTION NOTES)., Disp: , Rfl: 0 .  Omega-3 Fatty Acids (FISH OIL) 1200 MG CAPS, Take 1 capsule by mouth daily., Disp: , Rfl:  .  ranolazine (RANEXA) 1000 MG SR tablet, Take 1  tablet (1,000 mg total) by mouth 2 (two) times daily., Disp: 60 tablet, Rfl: 6 .  rosuvastatin (CRESTOR) 20 MG tablet, Take 20 mg by mouth daily., Disp: , Rfl:  .  sodium bicarbonate 650 MG tablet, Take 650 mg by mouth 2 (two) times daily. , Disp: , Rfl:  .  sodium polystyrene (KAYEXALATE) 15 GM/60ML suspension, Take 15 g by mouth 3 (three) times a week. Mon / Wed / Fri, Disp: , Rfl:  .  Spirulina 500 MG TABS, Take 1 tablet by mouth daily., Disp: , Rfl:  .  torsemide (DEMADEX) 10 MG tablet, Take 10 mg by mouth daily., Disp: , Rfl:  .  torsemide (DEMADEX) 20 MG tablet, Take 1 tablet (20 mg total) by mouth daily. (Patient taking differently: Take 10 mg by mouth daily. ), Disp: 30 tablet, Rfl: 11  Past Medical History: Past Medical History  Diagnosis Date  . Hypertension   . Hypercholesterolemia   . Prostate cancer   . Enlarged liver   . Spleen enlarged   . Thrombocytopenia   . Myocardial infarction   . Anxiety   . Depression   . Shortness of breath   . Diabetes mellitus without complication     INSULIN DEPENDENT  . GERD (gastroesophageal reflux disease)   . History of shingles   . Ischemic cardiomyopathy     a. echo 08/06/2014: EF 25-30%,  multiple WMA, mod DD, PASP 49 mm Hg   . CKD (chronic kidney disease), stage IV     a. previously on dialysis; b. followed by Dr. Abigail Butts   . CAD (coronary artery disease)     a. s/p 4v CABG 2001(LIMA-LAD, SVG-D2, SVG-OM1, SVG-right PDA, SVG-right PL1); b. cath 07/30/2014 vein grafts all down, patent LIMA to LAD, significant LM and ost LCx dx; c. cath 08/07/2014 Synergy DES 2.5 mm x 18 mm to dist LM and ost LCx, rec lifelong DAPT  . Chronic systolic CHF (congestive heart failure)     a. echo 08/2013: EF 40-45%, impaired relaxation, mild MR, LA mildly dilated,      Tobacco Use: History  Smoking status  . Former Smoker  . Quit date: 02/07/2008  Smokeless tobacco  . 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 Participant demonstrates ability to understand and report RPE using BORG scale, to self-measure pulse accurately, and to acknowledge the importance of the exercise prescription.  Exercise Prescription Goal: Starting with aerobic activity 30 plus minutes a day, 3 days per week for initial exercise prescription. Provide home exercise prescription and guidelines that participant acknowledges understanding prior to discharge.  Activity Barriers & Risk Stratification:     Activity Barriers & Risk Stratification - 08/24/14 1337    Activity Barriers & Risk Stratification   Activity Barriers None   Risk Stratification High      6 Minute Walk:     6 Minute Walk      08/24/14 1448       6 Minute Walk   Phase Initial     Distance 1265 feet     Walk Time 6 minutes     Resting HR 59 bpm     Resting BP 130/70 mmHg     Max Ex. HR 100 bpm     Max Ex. BP 160/78 mmHg     RPE 11     Symptoms Yes (comment)     Comments Patient had chest pain at pain level of 2 during walk. He informed me of this pain after the walk was over and said that pain was relieved once he stopped walking.         Initial Exercise Prescription:     Initial Exercise Prescription - 08/24/14 1400    Date of Initial Exercise Prescription   Date 08/24/14   Treadmill   MPH 2.3   Grade 0   Minutes 15   Bike   Level 0.8   Minutes 15   Recumbant Bike   Level 2   RPM 40   Watts 20   Minutes 15   NuStep   Level 3   Watts 30   Minutes 15   Arm Ergometer   Level 1   Watts 10   Minutes 15   Arm/Foot Ergometer   Level 1   Watts 12   Minutes 15   Cybex   Level 1   RPM 40   Minutes 15   Recumbant Elliptical   Level 1    RPM 40   Watts 20   Minutes  15   Elliptical   Level 1   Speed 3   Minutes 5   REL-XR   Level 3   Watts 30   Minutes 15   Prescription Details   Frequency (times per week) 3   Duration Progress to 30 minutes of continuous aerobic without signs/symptoms of physical distress   Intensity   THRR REST +  30   Ratings of Perceived Exertion 11-15   Progression Continue progressive overload as per policy without signs/symptoms or physical distress.   Resistance Training   Training Prescription Yes   Weight 2   Reps 10-12      Exercise Prescription Changes:     Exercise Prescription Changes      08/31/14 0900 09/09/14 0900 09/15/14 0600       Exercise Review   Progression Yes Yes Yes     Response to Exercise   Blood Pressure (Admit)   132/78 mmHg     Blood Pressure (Exercise)   150/70 mmHg     Blood Pressure (Exit)   140/70 mmHg     Heart Rate (Admit)   78 bpm     Heart Rate (Exercise)   85 bpm     Heart Rate (Exit)   83 bpm     Rating of Perceived Exertion (Exercise)   11     Symptoms   Experiencing chest pain when walking on the treadmill. Allieviated with rest or stopping.      Comments   MD cleared him to resume playing pickleball     Frequency   Add 1 additional day to program exercise sessions.     Duration Progress to 30 minutes of continuous aerobic without signs/symptoms of physical distress Progress to 30 minutes of continuous aerobic without signs/symptoms of physical distress Progress to 30 minutes of continuous aerobic without signs/symptoms of physical distress     Intensity Rest + 30 Rest + 30 Rest + 30     Progression Continue progressive overload as per policy without signs/symptoms or physical distress. Continue progressive overload as per policy without signs/symptoms or physical distress. Continue progressive overload as per policy without signs/symptoms or physical distress.     Resistance Training   Training Prescription Yes Yes Yes     Weight _0 Reps 10-15 10-15 10-15     Interval Training   Interval Training No No No     Treadmill   MPH 2.4 2.4 2.7     Grade 0 0 0     Minutes _1 NuStep   Level   6     Watts   60     Minutes   20     REL-XR   Level   6     Watts   65     Minutes   15        Discharge Exercise Prescription (Final Exercise Prescription Changes):     Exercise Prescription Changes - 09/15/14 0600    Exercise Review   Progression Yes   Response to Exercise   Blood Pressure (Admit) 132/78 mmHg   Blood Pressure (Exercise) 150/70 mmHg   Blood Pressure (Exit) 140/70 mmHg   Heart Rate (Admit) 78 bpm   Heart Rate (Exercise) 85 bpm   Heart Rate (Exit) 83 bpm   Rating of Perceived Exertion (Exercise) 11   Symptoms Experiencing chest pain when walking on the treadmill. Allieviated with rest or stopping.  Comments MD cleared him to resume playing pickleball   Frequency Add 1 additional day to program exercise sessions.   Duration Progress to 30 minutes of continuous aerobic without signs/symptoms of physical distress   Intensity Rest + 30   Progression Continue progressive overload as per policy without signs/symptoms or physical distress.   Resistance Training   Training Prescription Yes   Weight 4   Reps 10-15   Interval Training   Interval Training No   Treadmill   MPH 2.7   Grade 0   Minutes 20   NuStep   Level 6   Watts 60   Minutes 20   REL-XR   Level 6   Watts 65   Minutes 15      Nutrition:  Target Goals: Understanding of nutrition guidelines, daily intake of sodium <1577m, cholesterol <2059m calories 30% from fat and 7% or less from saturated fats, daily to have 5 or more servings of fruits and vegetables.  Biometrics:     Pre Biometrics - 08/24/14 1459    Pre Biometrics   Height _0  (1.778 m)   Weight 223 lb (101.152 kg)   Waist Circumference 44.5 inches   Hip Circumference 41 inches   Waist to Hip Ratio 1.09 %   BMI (Calculated) 32.1       Nutrition  Therapy Plan and Nutrition Goals:     Nutrition Therapy & Goals - 09/03/14 1151    Nutrition Therapy   Diet --  Mr. RoMcintyreoes not wish to meet with dietitian at this time.      Nutrition Discharge: Rate Your Plate Scores:   Nutrition Goals Re-Evaluation:   Psychosocial: Target Goals: Acknowledge presence or absence of depression, maximize coping skills, provide positive support system. Participant is able to verbalize types and ability to use techniques and skills needed for reducing stress and depression.  Initial Review & Psychosocial Screening:     Initial Psych Review & Screening - 08/24/14 1358    Screening Interventions   Interventions Encouraged to exercise;Program counselor consult      Quality of Life Scores:     Quality of Life - 08/25/14 0738    Quality of Life Scores   Health/Function Pre 27.86 %   Socioeconomic Pre 30 %   Psych/Spiritual Pre 30 %   Family Pre 30 %   GLOBAL Pre 29.03 %      PHQ-9:     Recent Review Flowsheet Data    [facility] 08/24/2014   Decreased Interest 0   Down, Depressed, Hopeless 0   PHQ - 2 Score 0   Altered sleeping 0   Tired, decreased energy 0   Change in appetite 0   Feeling bad or failure about yourself  0   Trouble concentrating 0   Moving slowly or fidgety/restless 0   Suicidal thoughts 0   PHQ-9 Score 0      Psychosocial Evaluation and Intervention:     Psychosocial Evaluation - 08/31/14 1017    Psychosocial Evaluation & Interventions   Interventions Stress management education;Relaxation education;Encouraged to exercise with the program and follow exercise prescription   Comments Counselor met with Mr. RoCowgeroday for initial psychosocial evaluation.  He is a [age]ear old individual who had a stent inserted approximately 3 weeks ago.  He lives alone but has a strong support system with (2) adult daughters who live close by and he is actively involved in his local church.  Mr. RoAntkowiakas  other health issues including diabetes, and kidney failure.  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  He reports sleeping well and has a good appetite.  He denies current depressive symptoms, but admits he experienced some untreated depression when his spouse left 7 years ago.  Mr. Titzer is typically in a positive mood and reports minimal stress in his life currently.  His goals are to get his stamina and strength back in order to get back on the "pickle court" (like indoor tennis) as soon as possible.     Continued Psychosocial Services Needed Yes  Mr. Stenseth will benefit from all of the psychoeducational components of this program and consistent exercise.        Psychosocial Re-Evaluation:   Vocational Rehabilitation: Provide vocational rehab assistance to qualifying candidates.   Vocational Rehab Evaluation & Intervention:     Vocational Rehab - 08/24/14 1338    Initial Vocational Rehab Evaluation & Intervention   Assessment shows need for Vocational Rehabilitation No      Education: Education Goals: Education classes will be provided on a weekly basis, covering required topics. Participant will state understanding/return demonstration of topics presented.  Learning Barriers/Preferences:     Learning Barriers/Preferences - 08/24/14 1338    Learning Barriers/Preferences   Learning Barriers None   Learning Preferences None      Education Topics: General Nutrition Guidelines/Fats and Fiber: -Group instruction provided by verbal, written material, models and posters to present the general guidelines for heart healthy nutrition. Gives an explanation and review of dietary fats and fiber.   Controlling Sodium/Reading Food Labels: -Group verbal and written material supporting the discussion of sodium use in heart healthy nutrition. Review and explanation with models, verbal and written materials for utilization of the food  label.   Exercise Physiology & Risk Factors: - Group verbal and written instruction with models to review the exercise physiology of the cardiovascular system and associated critical values. Details cardiovascular disease risk factors and the goals associated with each risk factor.   Aerobic Exercise & Resistance Training: - Gives group verbal and written discussion on the health impact of inactivity. On the components of aerobic and resistive training programs and the benefits of this training and how to safely progress through these programs.   Flexibility, Balance, General Exercise Guidelines: - Provides group verbal and written instruction on the benefits of flexibility and balance training programs. Provides general exercise guidelines with specific guidelines to those with heart or lung disease. Demonstration and skill practice provided.   Stress Management: - Provides group verbal and written instruction about the health risks of elevated stress, cause of high stress, and healthy ways to reduce stress.   Depression: - Provides group verbal and written instruction on the correlation between heart/lung disease and depressed mood, treatment options, and the stigmas associated with seeking treatment.   Anatomy & Physiology of the Heart: - Group verbal and written instruction and models provide basic cardiac anatomy and physiology, with the coronary electrical and arterial systems. Review of: AMI, Angina, Valve disease, Heart Failure, Cardiac Arrhythmia, Pacemakers, and the ICD.   Cardiac Procedures: - Group verbal and written instruction and models to describe the testing methods done to diagnose heart disease. Reviews the outcomes of the test results. Describes the treatment choices: Medical Management, Angioplasty, or Coronary Bypass Surgery.   Cardiac Medications: - Group verbal and written instruction to review commonly prescribed medications for heart disease. Reviews the  medication, class of  the drug, and side effects. Includes the steps to properly store meds and maintain the prescription regimen.   Go Sex-Intimacy & Heart Disease, Get SMART - Goal Setting: - Group verbal and written instruction through game format to discuss heart disease and the return to sexual intimacy. Provides group verbal and written material to discuss and apply goal setting through the application of the S.M.A.R.T. Method.   Other Matters of the Heart: - Provides group verbal, written materials and models to describe Heart Failure, Angina, Valve Disease, and Diabetes in the realm of heart disease. Includes description of the disease process and treatment options available to the cardiac patient.   Exercise & Equipment Safety: - Individual verbal instruction and demonstration of equipment use and safety with use of the equipment.          Cardiac Rehab from 09/14/2014 in [facility] Cardiac Rehab   Date  08/24/14   Educator  SB   Instruction Review Code  2- meets goals/outcomes      Infection Prevention: - Provides verbal and written material to individual with discussion of infection control including proper hand washing and proper equipment cleaning during exercise session.      Cardiac Rehab from 09/14/2014 in [facility] Cardiac Rehab   Date  08/24/14   Educator  SB   Instruction Review Code  2- meets goals/outcomes      Falls Prevention: - Provides verbal and written material to individual with discussion of falls prevention and safety.      Cardiac Rehab from 09/14/2014 in [facility] Cardiac Rehab   Date  08/24/14   Educator  SB   Instruction Review Code  2- meets goals/outcomes      Diabetes: - Individual verbal and written instruction to review signs/symptoms of diabetes, desired ranges of glucose level fasting, after meals and with exercise. Advice that pre and post exercise glucose checks will be done for 3 sessions at entry of program.      Cardiac Rehab from 09/14/2014 in [facility]  Cardiac Rehab   Date  08/24/14   Educator  SB   Instruction Review Code  2- meets goals/outcomes       Knowledge Questionnaire Score:     Knowledge Questionnaire Score - 08/25/14 0713    Knowledge Questionnaire Score   Pre Score 26/28      Personal Goals and Risk Factors at Admission:     Personal Goals and Risk Factors at Admission - 08/24/14 1348    Personal Goals and Risk Factors on Admission   Increase Aerobic Exercise and Physical Activity Yes   Intervention While in program, learn and follow the exercise prescription taught. Start at a low level workload and increase workload after able to maintain previous level for 30 minutes. Increase time before increasing intensity.   Diabetes Yes   Goal Blood glucose control identified by blood glucose values, HgbA1C. Participant verbalizes understanding of the signs/symptoms of hyper/hypo glycemia, proper foot care and importance of medication and nutrition plan for blood glucose control.   Intervention Provide nutrition & aerobic exercise along with prescribed medications to achieve blood glucose in normal ranges: Fasting 65-99 mg/dL   Hypertension Yes   Goal Participant will see blood pressure controlled within the values of 140/22m/Hg or within value directed by their physician.   Intervention Provide nutrition & aerobic exercise along with prescribed medications to achieve BP 140/90 or less.   Lipids Yes   Goal Cholesterol controlled with medications as prescribed, with individualized exercise RX and with personalized  nutrition plan. Value goals: LDL < 9m, HDL > 468m Participant states understanding of desired cholesterol values and following prescriptions.   Intervention Provide nutrition & aerobic exercise along with prescribed medications to achieve LDL <7058mHDL >65m92m    Personal Goals and Risk Factors Review:      Goals and Risk Factor Review      08/26/14 1202 09/11/14 1207         Weight Management   Goals  Progress/Improvement seen  Yes      Comments  Doing well with weight managment. A few pounds down since admission.       Increase Aerobic Exercise and Physical Activity   Goals Progress/Improvement seen  No No      Comments Goals Comments: Had intermittent chest discomfort "not like my heart attack" after approx 2 minutes of exercising on the treadmill. 2.2mph68mo incline. I called Dr. GollaDonivan Scullce but they were unable to see him so recommended ER which I passed that info along. [facility] and they recommended Emerg Dept. Also. With rest HoyleOdennot have any further chest pain. I left a vm for his daughter also after I asked his permission to call her.  COntinues to have angina symptoms, especially when on the treadmill. Has appointment this afternoon with doctor. WIll talk to his doctor about his recurrent symptoms.       Diabetes   Goal  Blood glucose control identified by blood glucose values, HgbA1C. Participant verbalizes understanding of the signs/symptoms of hyper/hypo glycemia, proper foot care and importance of medication and nutrition plan for blood glucose control.      Progress seen towards goals  Yes      Comments  States blood sugar levels are controlled      Hypertension   Goal  Participant will see blood pressure controlled within the values of 140/90mm/31mr within value directed by their physician.      Progress seen toward goals  Yes      Comments  BP in good range      Abnormal Lipids   Goal  Cholesterol controlled with medications as prescribed, with individualized exercise RX and with personalized nutrition plan. Value goals: LDL < 70mg, 84m> 65mg. P16mcipant states understanding of desired cholesterol values and following prescriptions.      Progress seen towards goals  Unknown      Comments  no lab values to compare         Personal Goals Discharge:     Comments: 30 day review

## 2014-09-18 ENCOUNTER — Encounter: Payer: Medicare Other | Admitting: *Deleted

## 2014-09-18 DIAGNOSIS — I214 Non-ST elevation (NSTEMI) myocardial infarction: Secondary | ICD-10-CM

## 2014-09-18 DIAGNOSIS — Z9861 Coronary angioplasty status: Secondary | ICD-10-CM

## 2014-09-18 LAB — GLUCOSE, CAPILLARY: GLUCOSE-CAPILLARY: 137 mg/dL — AB (ref 65–99)

## 2014-09-18 NOTE — Progress Notes (Signed)
Daily Session Note  Patient Details  Name: Joseph [name] MRN: [id number] Date of Birth: 1945/10/03 Referring Provider:  Marden Noble, MD  Encounter Date: 09/18/2014  Check In:     Session Check In - 09/18/14 0945    Check-In   Staff Present Heath Lark RN, BSN, CCRP;Carroll Enterkin RN, Drusilla Kanner MS, ACSM CEP Exercise Physiologist   ER physicians immediately available to respond to emergencies See telemetry face sheet for immediately available ER MD   Medication changes reported     No   Fall or balance concerns reported    No   Warm-up and Cool-down Performed on first and last piece of equipment   VAD Patient? No   Pain Assessment   Currently in Pain? No/denies         Goals Met:  Independence with exercise equipment Exercise tolerated well No report of cardiac concerns or symptoms Strength training completed today  Goals Unmet:  Not Applicable  Goals Comments: Doing well with exercise prescription progression. Sy stated he had a high BP at home "200 over" when he got up this Am. He has taken his medications. He complained of some dizziness when he arrived.  BP lower on arrival.  Exercised at lower workloads. He continues to have daily anginal symptoms with exertion. Slowing down or resting will resolve the symptoms. He felt better at end of class, but was going to stop by or call his cardiologist to review his symptoms and concerns.    Dr. Emily Filbert is Medical Director for [facility].

## 2014-09-21 ENCOUNTER — Telehealth: Payer: Self-pay | Admitting: Cardiology

## 2014-09-21 DIAGNOSIS — I214 Non-ST elevation (NSTEMI) myocardial infarction: Secondary | ICD-10-CM | POA: Diagnosis not present

## 2014-09-21 NOTE — Telephone Encounter (Signed)
S/w pt who states he continues to be SOB when playing ball, taking out the trash, making the bed. Per pt, nephrologist, Dr. Juleen China,  changed torsemide dosage to 20mg  daily beginning today.  Reports BP 215/125 Friday. States today BP 146/70 "something" Indicates he is taking 12.5mg  coreg once per day.  Suggested to pt that he take 1/2 tablet twice a day as advised by MD.  Re-read Thurmond Butts Dunn's recommendations on Aug 9 Confirmed upcoming [place] appt and echo appt. Pt verbalized understanding and will continue to monitor.

## 2014-09-21 NOTE — Progress Notes (Signed)
Daily Session Note  Patient Details  Name: Joseph [name] MRN: [id number] Date of Birth: 03/10/1945 Referring Provider:  Leonie Man, MD  Encounter Date: 09/21/2014  Check In:     Session Check In - 09/21/14 0844    Check-In   Staff Present Lestine Box BS, ACSM EP-C, Exercise Physiologist;Carroll Enterkin RN, BSN;Kelly Amedeo Plenty BS, ACSM CEP Exercise Physiologist   ER physicians immediately available to respond to emergencies See telemetry face sheet for immediately available ER MD   Medication changes reported     No   Fall or balance concerns reported    No   Warm-up and Cool-down Performed on first and last piece of equipment   VAD Patient? No   Pain Assessment   Currently in Pain? No/denies         Goals Met:  Proper associated with RPD/PD & O2 Sat Independence with exercise equipment Exercise tolerated well No report of cardiac concerns or symptoms Strength training completed today  Goals Unmet:  Not Applicable  Goals Comments:    Dr. Emily Filbert is Medical Director for [facility].

## 2014-09-21 NOTE — Telephone Encounter (Signed)
Pt c/o Shortness Of Breath: STAT if SOB developed within the last 24 hours or pt is noticeably SOB on the phone  1. Are you currently SOB (can you hear that pt is SOB on the phone)? Yes (in office)   2. How long have you been experiencing SOB?  Couple weeks   3. Are you SOB when sitting or when up moving around?   Moving around bending over walking exerting on bike in rehab   4. Are you currently experiencing any other symptoms?    Friday patient got out of bed and felt dizzy and off balance one time episode    Checked bp at this time 215/125 recheck 185/115

## 2014-09-23 DIAGNOSIS — I214 Non-ST elevation (NSTEMI) myocardial infarction: Secondary | ICD-10-CM | POA: Diagnosis not present

## 2014-09-23 NOTE — Progress Notes (Signed)
Daily Session Note  Patient Details  Name: Joseph [name] MRN: [id number] Date of Birth: December 05, 1945 Referring Provider:  Leonie Man, MD  Encounter Date: 09/23/2014  Check In:     Session Check In - 09/23/14 0922    Check-In   Staff Present Nyoka Cowden RN;Lamiyah Schlotter BS, ACSM EP-C, Exercise Physiologist;Other   ER physicians immediately available to respond to emergencies See telemetry face sheet for immediately available ER MD   Medication changes reported     No   Fall or balance concerns reported    No   Warm-up and Cool-down Performed on first and last piece of equipment   VAD Patient? No   Pain Assessment   Currently in Pain? No/denies         Goals Met:  Proper associated with RPD/PD & O2 Sat Exercise tolerated well No report of cardiac concerns or symptoms Strength training completed today  Goals Unmet:  Not Applicable  Goals Comments:    Dr. Emily Filbert is Medical Director for [facility].

## 2014-09-23 NOTE — Progress Notes (Signed)
Cardiac Individual Treatment Plan  Patient Details  Name: Joseph [name] MRN: [id number] Date of Birth: 11-Jun-1945 Referring Provider:  Leonie Man, MD  Initial Encounter Date:    Visit Diagnosis: NSTEMI (non-ST elevated myocardial infarction)  Patient's Home Medications on Admission:  Current outpatient prescriptions:  .  aspirin 81 MG tablet, Take 81 mg by mouth daily., Disp: , Rfl:  .  BEE POLLEN PO, Take by mouth daily. 1 tsp, Disp: , Rfl:  .  calcitRIOL (ROCALTROL) 0.25 MCG capsule, Take 0.25 mcg by mouth daily. , Disp: , Rfl:  .  carvedilol (COREG) 12.5 MG tablet, Take 0.5 tablets (6.25 mg total) by mouth 2 (two) times daily with a meal. (Patient taking differently: Take 12.5 mg by mouth 2 (two) times daily with a meal. ), Disp: 60 tablet, Rfl: 2 .  clopidogrel (PLAVIX) 75 MG tablet, Take 1 tablet (75 mg total) by mouth daily., Disp: 90 tablet, Rfl: 3 .  hydrALAZINE (APRESOLINE) 25 MG tablet, Take 3 tablets (75 mg total) by mouth 3 (three) times daily., Disp: 300 tablet, Rfl: 11 .  isosorbide mononitrate (IMDUR) 60 MG 24 hr tablet, Take 1.5 tablets (90 mg total) by mouth daily., Disp: 45 tablet, Rfl: 6 .  LANTUS SOLOSTAR 100 UNIT/ML Solostar Pen, Inject 20 Units into the skin at bedtime. , Disp: , Rfl:  .  LYRICA 75 MG capsule, Take 75 mg by mouth 2 (two) times daily. , Disp: , Rfl: 0 .  nitroGLYCERIN (NITROLINGUAL) 0.4 MG/SPRAY spray, Place 1 spray under the tongue every 5 (five) minutes x 3 doses as needed for chest pain. Do not use together with sublingual nitro (Patient not taking: Reported on 09/16/2014), Disp: 12 g, Rfl: 5 .  NITROSTAT 0.4 MG SL tablet, place 1 tablet under the tongue if needed every 5 minutes for che...  (REFER TO PRESCRIPTION NOTES)., Disp: , Rfl: 0 .  Omega-3 Fatty Acids (FISH OIL) 1200 MG CAPS, Take 1 capsule by mouth daily., Disp: , Rfl:  .  ranolazine (RANEXA) 1000 MG SR tablet, Take 1 tablet (1,000 mg total) by mouth 2 (two) times daily., Disp: 60  tablet, Rfl: 6 .  rosuvastatin (CRESTOR) 20 MG tablet, Take 20 mg by mouth daily., Disp: , Rfl:  .  sodium bicarbonate 650 MG tablet, Take 650 mg by mouth 2 (two) times daily. , Disp: , Rfl:  .  sodium polystyrene (KAYEXALATE) 15 GM/60ML suspension, Take 15 g by mouth 3 (three) times a week. Mon / Wed / Fri, Disp: , Rfl:  .  Spirulina 500 MG TABS, Take 1 tablet by mouth daily., Disp: , Rfl:  .  torsemide (DEMADEX) 10 MG tablet, Take 10 mg by mouth daily., Disp: , Rfl:  .  torsemide (DEMADEX) 20 MG tablet, Take 1 tablet (20 mg total) by mouth daily. (Patient taking differently: Take 10 mg by mouth daily. ), Disp: 30 tablet, Rfl: 11  Past Medical History: Past Medical History  Diagnosis Date  . Hypertension   . Hypercholesterolemia   . Prostate cancer   . Enlarged liver   . Spleen enlarged   . Thrombocytopenia   . Myocardial infarction   . Anxiety   . Depression   . Shortness of breath   . Diabetes mellitus without complication     INSULIN DEPENDENT  . GERD (gastroesophageal reflux disease)   . History of shingles   . Ischemic cardiomyopathy     a. echo 08/06/2014: EF 25-30%, multiple WMA, mod DD, PASP 49 mm  Hg   . CKD (chronic kidney disease), stage IV     a. previously on dialysis; b. followed by Dr. Abigail Butts   . CAD (coronary artery disease)     a. s/p 4v CABG 2001(LIMA-LAD, SVG-D2, SVG-OM1, SVG-right PDA, SVG-right PL1); b. cath 07/30/2014 vein grafts all down, patent LIMA to LAD, significant LM and ost LCx dx; c. cath 08/07/2014 Synergy DES 2.5 mm x 18 mm to dist LM and ost LCx, rec lifelong DAPT  . Chronic systolic CHF (congestive heart failure)     a. echo 08/2013: EF 40-45%, impaired relaxation, mild MR, LA mildly dilated,      Tobacco Use: History  Smoking status  . Former Smoker  . Quit date: 02/07/2008  Smokeless tobacco  . 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 Participant demonstrates ability to understand and report RPE using BORG scale, to self-measure pulse accurately, and to acknowledge the importance of the exercise prescription.  Exercise Prescription Goal: Starting with aerobic activity 30 plus minutes a day, 3 days per week for initial exercise prescription. Provide home exercise prescription and guidelines that participant acknowledges understanding prior to discharge.  Activity Barriers & Risk Stratification:     Activity Barriers & Risk Stratification - 08/24/14 1337    Activity Barriers & Risk Stratification   Activity Barriers None   Risk Stratification High      6 Minute Walk:     6 Minute Walk      08/24/14 1448       6 Minute Walk   Phase Initial     Distance 1265 feet     Walk Time 6 minutes     Resting HR 59 bpm     Resting BP 130/70 mmHg     Max Ex. HR 100 bpm     Max Ex. BP 160/78 mmHg     RPE 11     Symptoms Yes (comment)     Comments Patient had chest pain at pain level of 2 during walk. He informed me of this pain after the walk was over and said that pain was relieved once he stopped walking.         Initial Exercise Prescription:     Initial Exercise Prescription - 08/24/14 1400    Date of Initial Exercise Prescription   Date 08/24/14   Treadmill   MPH 2.3   Grade 0   Minutes 15   Bike   Level 0.8   Minutes 15   Recumbant Bike   Level 2   RPM 40   Watts 20   Minutes 15   NuStep   Level 3   Watts 30   Minutes 15   Arm Ergometer   Level 1   Watts 10   Minutes 15   Arm/Foot Ergometer   Level 1   Watts 12   Minutes 15   Cybex   Level 1   RPM 40   Minutes 15   Recumbant Elliptical   Level 1   RPM 40  Watts 20   Minutes 15   Elliptical   Level 1   Speed 3   Minutes 5   REL-XR   Level 3   Watts 30   Minutes 15   Prescription Details   Frequency (times per week) 3   Duration Progress to 30 minutes of continuous aerobic without signs/symptoms of physical distress   Intensity   THRR REST +  30   Ratings of Perceived Exertion 11-15   Progression Continue progressive overload as per policy without signs/symptoms or physical distress.   Resistance Training   Training Prescription Yes   Weight 2   Reps 10-12      Exercise Prescription Changes:     Exercise Prescription Changes      08/31/14 0900 09/09/14 0900 09/15/14 0600       Exercise Review   Progression Yes Yes Yes     Response to Exercise   Blood Pressure (Admit)   132/78 mmHg     Blood Pressure (Exercise)   150/70 mmHg     Blood Pressure (Exit)   140/70 mmHg     Heart Rate (Admit)   78 bpm     Heart Rate (Exercise)   85 bpm     Heart Rate (Exit)   83 bpm     Rating of Perceived Exertion (Exercise)   11     Symptoms   Experiencing chest pain when walking on the treadmill. Allieviated with rest or stopping.      Comments   MD cleared him to resume playing pickleball     Frequency   Add 1 additional day to program exercise sessions.     Duration Progress to 30 minutes of continuous aerobic without signs/symptoms of physical distress Progress to 30 minutes of continuous aerobic without signs/symptoms of physical distress Progress to 30 minutes of continuous aerobic without signs/symptoms of physical distress     Intensity Rest + 30 Rest + 30 Rest + 30     Progression Continue progressive overload as per policy without signs/symptoms or physical distress. Continue progressive overload as per policy without signs/symptoms or physical distress. Continue progressive overload as per policy without signs/symptoms or physical distress.     Resistance Training   Training Prescription Yes Yes Yes     Weight _0 Reps 10-15  10-15 10-15     Interval Training   Interval Training No No No     Treadmill   MPH 2.4 2.4 2.7     Grade 0 0 0     Minutes _1 NuStep   Level   6     Watts   60     Minutes   20     REL-XR   Level   6     Watts   65     Minutes   15        Discharge Exercise Prescription (Final Exercise Prescription Changes):     Exercise Prescription Changes - 09/15/14 0600    Exercise Review   Progression Yes   Response to Exercise   Blood Pressure (Admit) 132/78 mmHg   Blood Pressure (Exercise) 150/70 mmHg   Blood Pressure (Exit) 140/70 mmHg   Heart Rate (Admit) 78 bpm   Heart Rate (Exercise) 85 bpm   Heart Rate (Exit) 83 bpm   Rating of Perceived Exertion (Exercise) 11   Symptoms Experiencing chest pain when walking on the treadmill. Allieviated  with rest or stopping.    Comments MD cleared him to resume playing pickleball   Frequency Add 1 additional day to program exercise sessions.   Duration Progress to 30 minutes of continuous aerobic without signs/symptoms of physical distress   Intensity Rest + 30   Progression Continue progressive overload as per policy without signs/symptoms or physical distress.   Resistance Training   Training Prescription Yes   Weight 4   Reps 10-15   Interval Training   Interval Training No   Treadmill   MPH 2.7   Grade 0   Minutes 20   NuStep   Level 6   Watts 60   Minutes 20   REL-XR   Level 6   Watts 65   Minutes 15      Nutrition:  Target Goals: Understanding of nutrition guidelines, daily intake of sodium <1554m, cholesterol <2012m calories 30% from fat and 7% or less from saturated fats, daily to have 5 or more servings of fruits and vegetables.  Biometrics:     Pre Biometrics - 08/24/14 1459    Pre Biometrics   Height _0  (1.778 m)   Weight 223 lb (101.152 kg)   Waist Circumference 44.5 inches   Hip Circumference 41 inches   Waist to Hip Ratio 1.09 %   BMI (Calculated) 32.1       Nutrition Therapy Plan  and Nutrition Goals:     Nutrition Therapy & Goals - 09/03/14 1151    Nutrition Therapy   Diet --  Joseph [name] not wish to meet with dietitian at this time.      Nutrition Discharge: Rate Your Plate Scores:   Nutrition Goals Re-Evaluation:   Psychosocial: Target Goals: Acknowledge presence or absence of depression, maximize coping skills, provide positive support system. Participant is able to verbalize types and ability to use techniques and skills needed for reducing stress and depression.  Initial Review & Psychosocial Screening:     Initial Psych Review & Screening - 08/24/14 1358    Screening Interventions   Interventions Encouraged to exercise;Program counselor consult      Quality of Life Scores:     Quality of Life - 08/25/14 0738    Quality of Life Scores   Health/Function Pre 27.86 %   Socioeconomic Pre 30 %   Psych/Spiritual Pre 30 %   Family Pre 30 %   GLOBAL Pre 29.03 %      PHQ-9:     Recent Review Flowsheet Data    [facility]   Decreased Interest 0   Down, Depressed, Hopeless 0   PHQ - 2 Score 0   Altered sleeping 0   Tired, decreased energy 0   Change in appetite 0   Feeling bad or failure about yourself  0   Trouble concentrating 0   Moving slowly or fidgety/restless 0   Suicidal thoughts 0   PHQ-9 Score 0      Psychosocial Evaluation and Intervention:     Psychosocial Evaluation - 08/31/14 1017    Psychosocial Evaluation & Interventions   Interventions Stress management education;Relaxation education;Encouraged to exercise with the program and follow exercise prescription   Comments Counselor met with Joseph [name] for initial psychosocial evaluation.  He is a [age]ear old individual who had a stent inserted approximately 3 weeks ago.  He lives alone but has a strong support system with (2) adult daughters who live close by and he is actively involved in his  local church.  Joseph [name] has other health  issues including diabetes, and kidney failure.  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  He reports sleeping well and has a good appetite.  He denies current depressive symptoms, but admits he experienced some untreated depression when his spouse left 7 years ago.  Joseph [name] is typically in a positive mood and reports minimal stress in his life currently.  His goals are to get his stamina and strength back in order to get back on the "pickle court" (like indoor tennis) as soon as possible.     Continued Psychosocial Services Needed Yes  Joseph [name] will benefit from all of the psychoeducational components of this program and consistent exercise.        Psychosocial Re-Evaluation:   Vocational Rehabilitation: Provide vocational rehab assistance to qualifying candidates.   Vocational Rehab Evaluation & Intervention:     Vocational Rehab - 08/24/14 1338    Initial Vocational Rehab Evaluation & Intervention   Assessment shows need for Vocational Rehabilitation No      Education: Education Goals: Education classes will be provided on a weekly basis, covering required topics. Participant will state understanding/return demonstration of topics presented.  Learning Barriers/Preferences:     Learning Barriers/Preferences - 08/24/14 1338    Learning Barriers/Preferences   Learning Barriers None   Learning Preferences None      Education Topics: General Nutrition Guidelines/Fats and Fiber: -Group instruction provided by verbal, written material, models and posters to present the general guidelines for heart healthy nutrition. Gives an explanation and review of dietary fats and fiber.   Controlling Sodium/Reading Food Labels: -Group verbal and written material supporting the discussion of sodium use in heart healthy nutrition. Review and explanation with models, verbal and written materials for utilization of the food label.   Exercise Physiology  & Risk Factors: - Group verbal and written instruction with models to review the exercise physiology of the cardiovascular system and associated critical values. Details cardiovascular disease risk factors and the goals associated with each risk factor.   Aerobic Exercise & Resistance Training: - Gives group verbal and written discussion on the health impact of inactivity. On the components of aerobic and resistive training programs and the benefits of this training and how to safely progress through these programs.   Flexibility, Balance, General Exercise Guidelines: - Provides group verbal and written instruction on the benefits of flexibility and balance training programs. Provides general exercise guidelines with specific guidelines to those with heart or lung disease. Demonstration and skill practice provided.   Stress Management: - Provides group verbal and written instruction about the health risks of elevated stress, cause of high stress, and healthy ways to reduce stress.   Depression: - Provides group verbal and written instruction on the correlation between heart/lung disease and depressed mood, treatment options, and the stigmas associated with seeking treatment.   Anatomy & Physiology of the Heart: - Group verbal and written instruction and models provide basic cardiac anatomy and physiology, with the coronary electrical and arterial systems. Review of: AMI, Angina, Valve disease, Heart Failure, Cardiac Arrhythmia, Pacemakers, and the ICD.   Cardiac Procedures: - Group verbal and written instruction and models to describe the testing methods done to diagnose heart disease. Reviews the outcomes of the test results. Describes the treatment choices: Medical Management, Angioplasty, or Coronary Bypass Surgery.   Cardiac Medications: - Group verbal and written instruction to review commonly prescribed medications for heart  disease. Reviews the medication, class of the drug, and  side effects. Includes the steps to properly store meds and maintain the prescription regimen.   Go Sex-Intimacy & Heart Disease, Get SMART - Goal Setting: - Group verbal and written instruction through game format to discuss heart disease and the return to sexual intimacy. Provides group verbal and written material to discuss and apply goal setting through the application of the S.M.A.R.T. Method.   Other Matters of the Heart: - Provides group verbal, written materials and models to describe Heart Failure, Angina, Valve Disease, and Diabetes in the realm of heart disease. Includes description of the disease process and treatment options available to the cardiac patient.   Exercise & Equipment Safety: - Individual verbal instruction and demonstration of equipment use and safety with use of the equipment.          Cardiac Rehab from 09/14/2014 in [facility]   Date  08/24/14   Educator  SB   Instruction Review Code  2- meets goals/outcomes      Infection Prevention: - Provides verbal and written material to individual with discussion of infection control including proper hand washing and proper equipment cleaning during exercise session.      Cardiac Rehab from 09/14/2014 in [facility]iac Rehab   Date  08/24/14   Educator  SB   Instruction Review Code  2- meets goals/outcomes      Falls Prevention: - Provides verbal and written material to individual with discussion of falls prevention and safety.      Cardiac Rehab from 09/14/2014 in [facility] Cardiac Rehab   Date  08/24/14   Educator  SB   Instruction Review Code  2- meets goals/outcomes      Diabetes: - Individual verbal and written instruction to review signs/symptoms of diabetes, desired ranges of glucose level fasting, after meals and with exercise. Advice that pre and post exercise glucose checks will be done for 3 sessions at entry of program.      Cardiac Rehab from 09/14/2014 in [facility] Cardiac Rehab   Date  08/24/14    Educator  SB   Instruction Review Code  2- meets goals/outcomes       Knowledge Questionnaire Score:     Knowledge Questionnaire Score - 08/25/14 0713    Knowledge Questionnaire Score   Pre Score 26/28      Personal Goals and Risk Factors at Admission:     Personal Goals and Risk Factors at Admission - 08/24/14 1348    Personal Goals and Risk Factors on Admission   Increase Aerobic Exercise and Physical Activity Yes   Intervention While in program, learn and follow the exercise prescription taught. Start at a low level workload and increase workload after able to maintain previous level for 30 minutes. Increase time before increasing intensity.   Diabetes Yes   Goal Blood glucose control identified by blood glucose values, HgbA1C. Participant verbalizes understanding of the signs/symptoms of hyper/hypo glycemia, proper foot care and importance of medication and nutrition plan for blood glucose control.   Intervention Provide nutrition & aerobic exercise along with prescribed medications to achieve blood glucose in normal ranges: Fasting 65-99 mg/dL   Hypertension Yes   Goal Participant will see blood pressure controlled within the values of 140/20m/Hg or within value directed by their physician.   Intervention Provide nutrition & aerobic exercise along with prescribed medications to achieve BP 140/90 or less.   Lipids Yes   Goal Cholesterol controlled with medications as prescribed,  with individualized exercise RX and with personalized nutrition plan. Value goals: LDL < 73m, HDL > 469m Participant states understanding of desired cholesterol values and following prescriptions.   Intervention Provide nutrition & aerobic exercise along with prescribed medications to achieve LDL <7049mHDL >22m6m    Personal Goals and Risk Factors Review:      Goals and Risk Factor Review      08/26/14 1202 09/11/14 1207         Weight Management   Goals Progress/Improvement seen  Yes       Comments  Doing well with weight managment. A few pounds down since admission.       Increase Aerobic Exercise and Physical Activity   Goals Progress/Improvement seen  No No      Comments Goals Comments: Had intermittent chest discomfort "not like my heart attack" after approx 2 minutes of exercising on the treadmill. 2.2mph76mo incline. I called Dr. GollaDonivan Scullce but they were unable to see him so recommended ER which I passed that info along. [facility] and they recommended Emerg Dept. Also. With rest Joseph [name] have any further chest pain. I left a vm for his daughter also after I asked his permission to call her.  COntinues to have angina symptoms, especially when on the treadmill. Has appointment this afternoon with doctor. WIll talk to his doctor about his recurrent symptoms.       Diabetes   Goal  Blood glucose control identified by blood glucose values, HgbA1C. Participant verbalizes understanding of the signs/symptoms of hyper/hypo glycemia, proper foot care and importance of medication and nutrition plan for blood glucose control.      Progress seen towards goals  Yes      Comments  States blood sugar levels are controlled      Hypertension   Goal  Participant will see blood pressure controlled within the values of 140/90mm/91mr within value directed by their physician.      Progress seen toward goals  Yes      Comments  BP in good range      Abnormal Lipids   Goal  Cholesterol controlled with medications as prescribed, with individualized exercise RX and with personalized nutrition plan. Value goals: LDL < 70mg, 15m> 22mg. P53mcipant states understanding of desired cholesterol values and following prescriptions.      Progress seen towards goals  Unknown      Comments  no lab values to compare         Personal Goals Discharge:     Comments: Not dizzy today doing better. 30 day review

## 2014-09-23 NOTE — Addendum Note (Signed)
Addended by: Gerlene Burdock on: 09/23/2014 09:44 AM   Modules accepted: Orders

## 2014-09-25 ENCOUNTER — Encounter: Payer: Medicare Other | Admitting: *Deleted

## 2014-09-25 ENCOUNTER — Telehealth: Payer: Self-pay

## 2014-09-25 DIAGNOSIS — I214 Non-ST elevation (NSTEMI) myocardial infarction: Secondary | ICD-10-CM

## 2014-09-25 NOTE — Telephone Encounter (Signed)
Forward phone note to Christell Faith, PA-C

## 2014-09-25 NOTE — Progress Notes (Signed)
Daily Session Note  Patient Details  Name: CLETUS PARIS MRN: [id number] Date of Birth: [birth year] Referring Provider:  Leonie Man, MD  Encounter Date: 09/25/2014  Check In:     Session Check In - 09/25/14 0955    Check-In   Staff Present Nyoka Cowden RN;Rosely Fernandez RN, BSN  Hessie Knows,    ER physicians immediately available to respond to emergencies See telemetry face sheet for immediately available ER MD   Medication changes reported     No   Fall or balance concerns reported    No   Pain Assessment   Currently in Pain? Yes   Pain Location Chest   Pain Onset More than a month ago   Pain Frequency Intermittent         Goals Met:  Proper associated with RPD/PD & O2 Sat Exercise tolerated well No report of cardiac concerns or symptoms  Goals Unmet:  Not Applicable  Goals Comments: Has some intermittent Chest pain exercising on the sit down Nustep.    Dr. Emily Filbert is Medical Director for [place] and [facility].

## 2014-09-26 NOTE — Telephone Encounter (Signed)
Please schedule patient for treadmill Myoview.

## 2014-09-28 ENCOUNTER — Encounter: Payer: Medicare Other | Admitting: *Deleted

## 2014-09-28 DIAGNOSIS — I214 Non-ST elevation (NSTEMI) myocardial infarction: Secondary | ICD-10-CM

## 2014-09-28 NOTE — Telephone Encounter (Signed)
S/w pt who states that along with going to cardiac rehab, he has been going to [facility] to exercise. States today he rode bike for an hour and walked 6 minutes on treadmill. Denied any pain or SOB. Reviewed Ryan's recommendation with pt. Pt states that he wants to wait until he sees Dr. Ellyn Hack on 9/7 before having any more test. Told pt to notify us if any more CP or SOB or if he decided he would like treadmill myoview before appt. Pt verbalized understanding.

## 2014-09-28 NOTE — Telephone Encounter (Signed)
Left message to CB on VM

## 2014-09-28 NOTE — Progress Notes (Signed)
Daily Session Note  Patient Details  Name: Joseph [name] MRN: [id number] Date of Birth: 03/19/1945 Referring Provider:  Leonie Man, MD  Encounter Date: 09/28/2014  Check In:     Session Check In - 09/28/14 0843    Check-In   Staff Present Candiss Norse MS, ACSM CEP Exercise Physiologist;Kelly Alfonso Patten, ACSM CEP Exercise Physiologist;Carroll Enterkin RN, BSN;Susanne Bice RN, BSN, [place]   ER physicians immediately available to respond to emergencies See telemetry face sheet for immediately available ER MD   Medication changes reported     No   Fall or balance concerns reported    No   Warm-up and Cool-down Performed on first and last piece of equipment   VAD Patient? No   Pain Assessment   Currently in Pain? No/denies   Multiple Pain Sites No         Goals Met:  Independence with exercise equipment Exercise tolerated well Personal goals reviewed No report of cardiac concerns or symptoms Strength training completed today  Goals Unmet:  Not Applicable  Goals Comments: Talked with [name] about his exercise goals. He is still having angina when he walks for exercise. He has been going to a community gym when he does not come to [facility] and has been riding the bike there. He still wants to play pickle ball, but at this point he cannot due to angina symptoms.  [name]'s pain was a 2/10 today on the NuStep. He walked slowly on the treadmill for 6 minutes and had no chest pain.   Dr. Emily Filbert is Medical Director for [place] and [facility].

## 2014-09-30 ENCOUNTER — Encounter: Payer: Medicare Other | Admitting: *Deleted

## 2014-09-30 DIAGNOSIS — I214 Non-ST elevation (NSTEMI) myocardial infarction: Secondary | ICD-10-CM | POA: Diagnosis not present

## 2014-09-30 NOTE — Progress Notes (Signed)
Daily Session Note  Patient Details  Name: Joseph [name] MRN: [id number] Date of Birth: [birth year] Referring Provider:  Leonie Man, MD  Encounter Date: 09/30/2014  Check In:     Session Check In - 09/30/14 0835    Check-In   Staff Present Lestine Box BS, ACSM EP-C, Exercise Physiologist;Susanne Bice RN, BSN, CCRP;Renee Dillard Essex MS, ACSM CEP Exercise Physiologist   ER physicians immediately available to respond to emergencies See telemetry face sheet for immediately available ER MD   Medication changes reported     No   Fall or balance concerns reported    No   Warm-up and Cool-down Performed on first and last piece of equipment   VAD Patient? No   Pain Assessment   Currently in Pain? No/denies         Goals Met:  Proper associated with RPD/PD & O2 Sat Exercise tolerated well No report of cardiac concerns or symptoms Strength training completed today  Goals Unmet:  Not Applicable  Goals Comments: Patient was experiencing chest pain while exercising. Their chest pain was resolved when they stopped activity and they were encouraged to rest and to resume exercise at a lighter intensity. Chest pain also resolved when exercising at a lighter intensity.   Dr. Emily Filbert is Medical Director for [place] and [facility].

## 2014-10-02 ENCOUNTER — Encounter: Payer: Medicare Other | Admitting: *Deleted

## 2014-10-02 DIAGNOSIS — I214 Non-ST elevation (NSTEMI) myocardial infarction: Secondary | ICD-10-CM | POA: Diagnosis not present

## 2014-10-02 DIAGNOSIS — Z9861 Coronary angioplasty status: Secondary | ICD-10-CM

## 2014-10-02 NOTE — Progress Notes (Signed)
Daily Session Note  Patient Details  Name: Joseph [name] MRN: [id number] Date of Birth: [birth year] Referring Provider:  Leonie Man, MD  Encounter Date: 10/02/2014  Check In:     Session Check In - 10/02/14 0953    Check-In   Staff Present Heath Lark RN, BSN, CCRP;Carroll Enterkin RN, BSN;Renee Dillard Essex MS, ACSM CEP Exercise Physiologist   ER physicians immediately available to respond to emergencies See telemetry face sheet for immediately available ER MD   Medication changes reported     No   Fall or balance concerns reported    No   VAD Patient? No   Pain Assessment   Currently in Pain? No/denies         Goals Met:  Independence with exercise equipment Exercise tolerated well No report of cardiac concerns or symptoms Strength training completed today  Goals Unmet:  Not Applicable  Goals Comments: Doing well with exercise prescription progression.    Dr. Emily Filbert is Medical Director for [place] and [facility].

## 2014-10-05 ENCOUNTER — Encounter: Payer: Medicare Other | Admitting: *Deleted

## 2014-10-05 DIAGNOSIS — Z9861 Coronary angioplasty status: Secondary | ICD-10-CM

## 2014-10-05 DIAGNOSIS — I214 Non-ST elevation (NSTEMI) myocardial infarction: Secondary | ICD-10-CM

## 2014-10-05 NOTE — Progress Notes (Signed)
Daily Session Note  Patient Details  Name: Joseph [name] MRN: [id number] Date of Birth: 02/03/1946 Referring Provider:  Marden Noble, MD  Encounter Date: 10/05/2014  Check In:     Session Check In - 10/05/14 0835    Check-In   Staff Present Candiss Norse MS, ACSM CEP Exercise Physiologist;Susanne Bice RN, BSN, CCRP;Natayah Warmack Alfonso Patten, ACSM CEP Exercise Physiologist   ER physicians immediately available to respond to emergencies See telemetry face sheet for immediately available ER MD   Medication changes reported     No   Fall or balance concerns reported    No   Warm-up and Cool-down Performed on first and last piece of equipment   VAD Patient? No   Pain Assessment   Currently in Pain? No/denies   Multiple Pain Sites No         Goals Met:  Independence with exercise equipment Exercise tolerated well No report of cardiac concerns or symptoms  Strength training preformed today  Goals Unmet:  Not Applicable  Goals Comments:    Dr. Emily Filbert is Medical Director for [place] and [facility].

## 2014-10-07 DIAGNOSIS — I214 Non-ST elevation (NSTEMI) myocardial infarction: Secondary | ICD-10-CM | POA: Diagnosis not present

## 2014-10-07 NOTE — Progress Notes (Signed)
Daily Session Note  Patient Details  Name: Davyon B Virts MRN: [id number] Date of Birth: 06/15/1945 Referring Provider:  Harding, David W, MD  Encounter Date: 10/07/2014  Check In:     Session Check In - 10/07/14 0846    Check-In   Staff Present   BS, ACSM EP-C, Exercise Physiologist;Renee MacMillan MS, ACSM CEP Exercise Physiologist;Mary Jo Abernethy RN   ER physicians immediately available to respond to emergencies See telemetry face sheet for immediately available ER MD   Medication changes reported     No   Fall or balance concerns reported    No   Warm-up and Cool-down Performed on first and last piece of equipment   VAD Patient? No   Pain Assessment   Currently in Pain? Yes   Pain Score 2    Pain Location Chest   Pain Type Acute pain   Aggravating Factors  exercise         Goals Met:  Proper associated with RPD/PD & O2 Sat Exercise tolerated well No report of cardiac concerns or symptoms Strength training completed today  Goals Unmet:  Not Applicable  Goals Comments:Slight pain noted during exercise on Nustep.   Dr. Mark Miller is Medical Director for [facility] and [facility]. 

## 2014-10-09 ENCOUNTER — Encounter: Payer: Medicare Other | Attending: Cardiology | Admitting: *Deleted

## 2014-10-09 DIAGNOSIS — Z9861 Coronary angioplasty status: Secondary | ICD-10-CM | POA: Diagnosis present

## 2014-10-09 DIAGNOSIS — I214 Non-ST elevation (NSTEMI) myocardial infarction: Secondary | ICD-10-CM | POA: Diagnosis not present

## 2014-10-09 NOTE — Progress Notes (Signed)
Daily Session Note  Patient Details  Name: ALEKSANDAR DUVE MRN: [id number] Date of Birth: [birth year] Referring Provider:  Leonie Man, MD  Encounter Date: 10/09/2014  Check In:     Session Check In - 10/09/14 0904    Check-In   Staff Present Nyoka Cowden RN;Carroll Enterkin RN, Drusilla Kanner MS, ACSM CEP Exercise Physiologist   ER physicians immediately available to respond to emergencies See telemetry face sheet for immediately available ER MD   Medication changes reported     No   Fall or balance concerns reported    No   Warm-up and Cool-down Performed on first and last piece of equipment   VAD Patient? No   Pain Assessment   Currently in Pain? No/denies   Multiple Pain Sites No         Goals Met:  Independence with exercise equipment Exercise tolerated well No report of cardiac concerns or symptoms  Goals Unmet:  Not Applicable  Goals Comments: Sabre exercised for an extra 15 minutes on the aerobic equipment instead of doing weights per his request.    Dr. Emily Filbert is Medical Director for [place] and [facility].

## 2014-10-14 ENCOUNTER — Encounter: Payer: Self-pay | Admitting: Cardiology

## 2014-10-14 ENCOUNTER — Telehealth: Payer: Self-pay | Admitting: Cardiology

## 2014-10-14 ENCOUNTER — Ambulatory Visit (INDEPENDENT_AMBULATORY_CARE_PROVIDER_SITE_OTHER): Payer: Medicare Other | Admitting: Cardiology

## 2014-10-14 VITALS — BP 168/76 | HR 65 | Ht 71.0 in | Wt 218.0 lb

## 2014-10-14 DIAGNOSIS — I208 Other forms of angina pectoris: Secondary | ICD-10-CM | POA: Diagnosis not present

## 2014-10-14 DIAGNOSIS — I2510 Atherosclerotic heart disease of native coronary artery without angina pectoris: Secondary | ICD-10-CM

## 2014-10-14 DIAGNOSIS — I214 Non-ST elevation (NSTEMI) myocardial infarction: Secondary | ICD-10-CM | POA: Diagnosis not present

## 2014-10-14 DIAGNOSIS — I255 Ischemic cardiomyopathy: Secondary | ICD-10-CM | POA: Diagnosis not present

## 2014-10-14 DIAGNOSIS — Z9861 Coronary angioplasty status: Secondary | ICD-10-CM

## 2014-10-14 DIAGNOSIS — R0789 Other chest pain: Secondary | ICD-10-CM

## 2014-10-14 DIAGNOSIS — N184 Chronic kidney disease, stage 4 (severe): Secondary | ICD-10-CM

## 2014-10-14 DIAGNOSIS — I5042 Chronic combined systolic (congestive) and diastolic (congestive) heart failure: Secondary | ICD-10-CM

## 2014-10-14 DIAGNOSIS — I10 Essential (primary) hypertension: Secondary | ICD-10-CM

## 2014-10-14 MED ORDER — HYDRALAZINE HCL 100 MG PO TABS: 50.0000 mg | ORAL_TABLET | Freq: Three times a day (TID) | ORAL | Status: DC

## 2014-10-14 NOTE — Telephone Encounter (Signed)
FYI patient continues to c/o soreness/pain and difficulty breathing when exerting.  Patient wants to talk about this at current visit

## 2014-10-14 NOTE — Telephone Encounter (Signed)
Noted  

## 2014-10-14 NOTE — Progress Notes (Signed)
PATIENT: Joseph [name] MRN: [id number] DOB: 1945/04/14 PCP: Marden Noble, MD  Clinic Note: Chief Complaint  Patient presents with  . other    C/o chest pain and sob. Meds reviewed verbally with pt.  . Coronary Artery Disease  . Cardiomyopathy  . Congestive Heart Failure    Chronic combined systolic and diastolic    HPI: Joseph [name] is a 69 y.o. male with a PMH below who presents today for f/u of CAD-NSTEMI.[name] CAD status post CABG in 2001 at [facility], SVG-D2, SVG-OM1, SVG-right PDA, SVG-right PL1) and prior PCI, ischemic cardiomyopathy/chronic systolic CHF with EF 50-27% by echo 08/06/2014, palpitations, CKD stage IV (previously treated with dialysis), chronic thrombocytopenia, and prostate CA who presents for hospital follow up from recent admission to [facility] from 7/20 to 7/21 for atypical chest pain occuring at cardiac rehab.  cardiac catheterization with Dr. Humphrey Rolls in [facility] 07/30/2014. This demonstrated all vein grafts occluded, known since 2009. LIMA to the LAD was patent. There was a 70% ostial left main lesion, 70% ostial LCx and an occluded RCA. The patient's case was discussed with Dr. Terrence Dupont for consideration of PCI. Because transitioning care to Dr. Ellyn Hack in [place], we assumed his care in June 2016. Echocardiogram on 08/06/2014 demonstrated severely reduced LV function with an EF of 25-30%, multiple wall motion abnormalities, moderate diastolic dysfunction and moderately elevated pulmonary pressures with a PASP 49 mmHg and moderately reduced RV function. He was seen by nephrology who recommended IV fluids and minimal contrast dye. The patient underwent cardiac catheterization with Dr. Gwenlyn Found 08/07/14. He was noted to have critical left main disease and ostial left circumflex disease, both 90% stenosed. Both were treated with a Synergy DES. Post hospitalization he did well. He did not resume his Ranexa post PCI  7/20 for chest pain at cardiac rehab - also  had palpitations and had Holter monitor X 48 hours, with PACs, burst of PAT and brady to the 40s. No VT. EKG in ER sinus brady to 47 and up to low 50s. His bradycardia precluded further titation of his beta blocker. The patient was admitted and ruled out for an MI. His D-dimer was elevated and it was felt he should have a VQ which was done 08/27/14 and was low risk for PE. His Hydralyzine was increased for afterload reduction. Torsemide continued on a daily schedule. He was instructed to start taking Ranexa as previously ordered. .   Seen by Mr. Christell Faith 09/11/14: noted some exertional chest pain when on the treadmill at 2/5 grade, though he can ride his bike for extended time periods (>69minutie) without any issues. He would like to know if there is any further medical management he can do for this. He wants to get back to playing "pickle-ball."  Studies Reviewed:   Echo 08/06/14: EF 25-30%, Akinesis/scarring of inferior wall (RCA territory), HK of basal-mid Angerolateral & apical-septal/anterior/inferoseptal wall.  Severe LA dilation.  PAP ~45-50 mmHg.pseudo-normal filling (grade 2 diastolic dysfunction)   Interval History: Joseph [name] presents today for followup. He continues to have complaints of intermittent chest discomfort that often times happens after exerting himself or not but certainly would exert himself. He usually does anywhere from 15-20 minutes at a steady pace walking usually about a mile a time. He can also about our bicycle without difficulty. He says he may have a little chest discomfort but nothing limiting. He describes it as a twinge-type sensation.: Short of breath when he exerts himself significantly,  but not with routine activity.  The remainder of Cardiovascular ROS is as follows:: positive for - chest pain, dyspnea on exertion, irregular heartbeat and Decreased energy negative for - edema, orthopnea, paroxysmal nocturnal dyspnea, rapid heart rate or shortness of breath,  syncope/near-syncope, TIA/amaurosis fugax.  Past Medical History  Diagnosis Date  . Hypertension   . Hypercholesterolemia   . Prostate cancer   . Enlarged liver   . Spleen enlarged   . Thrombocytopenia   . Myocardial infarction   . Anxiety   . Depression   . Shortness of breath   . Diabetes mellitus without complication     INSULIN DEPENDENT  . GERD (gastroesophageal reflux disease)   . History of shingles   . Ischemic cardiomyopathy     a. echo 08/06/2014: EF 25-30%, multiple WMA, mod DD, PASP 49 mm Hg   . CKD (chronic kidney disease), stage IV     a. previously on dialysis; b. followed by Dr. Abigail Butts   . Coronary artery disease, occlusive     a. s/p 4v CABG 2001(LIMA-LAD, SVG-D2, SVG-OM1, SVG-right PDA, SVG-right PL1); b. cath 07/30/2014 vein grafts all down, patent LIMA to LAD, significant LM and ost LCx dx; c. cath 08/07/2014 Synergy DES 2.5 mm x 18 mm to dist LM and ost LCx, rec lifelong DAPT  . Coronary artery disease involving coronary bypass graft 08/2014    all vein grafts occluded. Patent LIMA-LAD -- s/p PCI ot LM-Cx;   [name] Chronic combined systolic and diastolic CHF, NYHA class 2     a. echo 08/2013: EF 40-45%, impaired relaxation, mild MR, LA mildly dilated,      Prior Cardiac Evaluation and Past Surgical History: Past Surgical History  Procedure Laterality Date  . Cardiac surgery    . Shoulder arthroscopy    . Penile prosthesis implant    . Myocardial infarction    . Cardiac catheterization    . Coronary angioplasty    . Coronary artery bypass graft  2001  . Colonoscopy    . Liver biopsy    . Av fistula placement Left 2015  . Cardiac catheterization N/A 07/30/2014    Procedure: Left Heart Cath and Cors/Grafts Angiography;  Surgeon: Dionisio David, MD;  Location: [facility];  Service: Cardiovascular;  Laterality: N/A;  . Cardiac catheterization N/A 08/07/2014    Procedure: Coronary Stent Intervention;  Surgeon: Lorretta Harp, MD;  Location: [place] [facility];  Service: Cardiovascular;  Laterality: N/A;    Allergies  Allergen Reactions  . Sulfa Antibiotics Itching    hives    Current Outpatient Prescriptions  Medication Sig Dispense Refill  . aspirin 81 MG tablet Take 81 mg by mouth daily.    [name] BEE POLLEN PO Take by mouth daily. 1 tsp    . calcitRIOL (ROCALTROL) 0.25 MCG capsule Take 0.25 mcg by mouth daily.     . carvedilol (COREG) 12.5 MG tablet Take 0.5 tablets (6.25 mg total) by mouth 2 (two) times daily with a meal. (Patient taking differently: Take 12.5 mg by mouth 2 (two) times daily with a meal. ) 60 tablet 2  . clopidogrel (PLAVIX) 75 MG tablet Take 1 tablet (75 mg total) by mouth daily. 90 tablet 3  . ferrous sulfate 325 (65 FE) MG EC tablet Take 325 mg by mouth daily with breakfast.     . isosorbide mononitrate (IMDUR) 60 MG 24 hr tablet Take 1.5 tablets (90 mg total) by mouth daily. (Patient taking differently: Take by  mouth. Takes 60 mg am and 30 mg pm daily.) 45 tablet 6  . LANTUS SOLOSTAR 100 UNIT/ML Solostar Pen Inject 20 Units into the skin at bedtime.     [name] LYRICA 75 MG capsule Take 75 mg by mouth 2 (two) times daily.   0  . nitroGLYCERIN (NITROLINGUAL) 0.4 MG/SPRAY spray Place 1 spray under the tongue every 5 (five) minutes x 3 doses as needed for chest pain. Do not use together with sublingual nitro 12 g 5  . NITROSTAT 0.4 MG SL tablet place 1 tablet under the tongue if needed every 5 minutes for che...  (REFER TO PRESCRIPTION NOTES).  0  . Omega-3 Fatty Acids (FISH OIL) 1200 MG CAPS Take 1 capsule by mouth daily.    . ranolazine (RANEXA) 1000 MG SR tablet Take 1 tablet (1,000 mg total) by mouth 2 (two) times daily. 60 tablet 6  . rosuvastatin (CRESTOR) 20 MG tablet Take 20 mg by mouth daily.    . sodium bicarbonate 650 MG tablet Take 650 mg by mouth 2 (two) times daily.     . sodium polystyrene (KAYEXALATE) 15 GM/60ML suspension Take 15 g by mouth 3 (three) times a week. Mon / Wed / Fri    . Spirulina 500 MG TABS Take 1  tablet by mouth daily.    [name] torsemide (DEMADEX) 20 MG tablet Take 1 tablet (20 mg total) by mouth daily. (Patient taking differently: Take 10 mg by mouth daily. ) 30 tablet 11  . hydrALAZINE (APRESOLINE) 100 MG tablet Take 0.5 tablets (50 mg total) by mouth 3 (three) times daily. 90 tablet 3   No current facility-administered medications for this visit.    Social History   Social History Narrative    family history includes Acute myelogenous leukemia in his brother; CAD in his brother; Diabetes Mellitus II in his brother; Heart disease (age of onset: 96) in his father.  ROS: A comprehensive Review of Systems -  Review of Systems  Constitutional: Positive for malaise/fatigue (He does note a little less energy than he used to have but nothing significant.). Negative for weight loss.  Respiratory: Negative for cough.   Cardiovascular: Positive for chest pain (intermittent chest discomfort described as slight twinges, usually after exertion, not with exertion. ). Negative for claudication and leg swelling.  Gastrointestinal: Negative for blood in stool and melena.  Genitourinary: Negative for hematuria.  Musculoskeletal: Positive for joint pain.  Neurological: Negative for focal weakness.  Endo/Heme/Allergies: Bruises/bleeds easily.  All other systems reviewed and are negative.   PHYSICAL EXAM BP 168/76 mmHg  Pulse 65  Ht 5\' 11"  (1.803 m)  Wt 218 lb (98.884 kg)  BMI 30.42 kg/m2 General appearance: alert, cooperative, appears stated age, no distress, mildly obese and Pleasant mood and affect. Neck: no adenopathy, no carotid bruit, no JVD and There is some mild pulsatility in the jugular veins Lungs: clear to auscultation bilaterally, normal percussion bilaterally and Nonlabored, good movement Heart: RRR with normal S1 and S2. Soft S4 gallop noted. Occasional ectopy noted in no other M./R./G. Nondisplaced PMI. Abdomen: soft, non-tender; bowel sounds normal; no masses,  no  organomegaly Extremities: extremities normal, atraumatic, no cyanosis or edema and no edema, redness or tenderness in the calves or thighs Pulses: 2+ and symmetric Skin: Skin color, texture, turgor normal. No rashes or lesions Neurologic: Grossly normal   Adult ECG Report  Rate: 65 ;  Rhythm: normal sinus rhythm, premature ventricular contractions (PVC) and Inferior Q waves consistent with old inferior  infarct. Also nonspecific ST-T wave abnormalities the mild depression and inversion in lateral leads.   Narrative Interpretation: otherwise normal axis, intervals and durations. Borderline 1 AV Nodal block.  Recent Labs: Lab Results  Component Value Date   CREATININE 4.15* 09/11/2014   Lab Results  Component Value Date   K 4.0 09/11/2014   Lab Results  Component Value Date   CHOL 97 04/25/2013   HDL 33* 04/25/2013   LDLCALC 41 04/25/2013   TRIG 113 04/25/2013    ASSESSMENT / PLAN:   Problem List Items Addressed This Visit    Atypical chest pain    I do think that some of his chest pain symptoms are non-anginal in nature and just atypical for cardiac etiology. It makes it difficult because the symptoms often overlap with true anginal symptoms.      Relevant Orders   EKG 12-Lead (Completed)   CAD S/P PCI/DES July 2016 (Chronic)    Overall, he may have some exertional angina which is expected base of the stented coronary disease he has. At this point don't think a stress test would be warranted. He is on regimen including beta blocker, statin, aspirin plus Plavix as well as hydralazine and nitrates. I don't think he is currently doing cardiac rehabilitation, and is still exercising.  He is discharged. Is not able to get out and play Pickle Diona Foley yet -- not sure this is more related to angina or heart failure.      Relevant Medications   hydrALAZINE (APRESOLINE) 100 MG tablet   Chronic combined systolic and diastolic CHF, NYHA class 2 (Chronic)    He actually is doing pretty  well overall from a heart failure standpoint. Denies any significant edema or PND/orthopnea. Maybe intermittently but nothing significant. He does have some exertional dyspnea which was EF of 25% is not unreasonable. He needs additional afterload reduction so we will increase his hydralazine to 100 mg 3 times a day plus Imdur. I cannot increase his carvedilol and further due to bradycardia. He takes torsemide only 20 mg a day and states that his weight has been relatively stable plus or -20 pounds. We talked about sliding scale.      Relevant Medications   hydrALAZINE (APRESOLINE) 100 MG tablet   Chronic renal disease, stage IV (Chronic)    Based on having a baseline creatinine in the 4.0 range, I would be reluctant to do any type of invasive evaluation or treatment unless he has significant progressively worsening symptoms.      Essential hypertension - Primary (Chronic)    Poorly controlled. Increase hydralazine 100 mg 3 times a day. Unable to titrate her beta blocker further due to bradycardia while in the hospital. Next option will be to start amlodipine.      Relevant Medications   hydrALAZINE (APRESOLINE) 100 MG tablet   Other Relevant Orders   EKG 12-Lead (Completed)   Ischemic cardiomyopathy (Chronic)    With an EF of 25-30% and chronic long-standing ischemic coronary disease, I doubt that his EF will improve much.  I would like to optimize his management with afterload reduction and optimal diuresis prior to rechecking echocardiogram. Low threshold for referral to Electrophysiology for consideration of ICD.      Relevant Medications   hydrALAZINE (APRESOLINE) 100 MG tablet   Stable angina (Chronic)    Pretty much class I stable angina -- mostly exertional but not totally limiting. He is on long-acting nitrate plus full dose for Ranexa.  For now I think  the best option is continue medical management without any stress testing in the absence of progressive symptoms. Continued blood  pressure control for afterload reduction to treat diastolic failure related angina.      Relevant Medications   hydrALAZINE (APRESOLINE) 100 MG tablet      Meds ordered this encounter  Medications  . ferrous sulfate 325 (65 FE) MG EC tablet    Sig: Take 325 mg by mouth daily with breakfast.   . hydrALAZINE (APRESOLINE) 100 MG tablet    Sig: Take 0.5 tablets (50 mg total) by mouth 3 (three) times daily.    Dispense:  90 tablet    Refill:  3    Followup: roughly 3 months. At which time I will re-check an echocardiogram.   DAVID W. Ellyn Hack, M.D., M.S. Interventional Cardiolgy [facility]

## 2014-10-14 NOTE — Progress Notes (Signed)
Daily Session Note  Patient Details  Name: Joseph [name] MRN: [id number] Date of Birth: 15-Sep-1945 Referring Provider:  Leonie Man, MD  Encounter Date: 10/14/2014  Check In:     Session Check In - 10/14/14 0148    Check-In   Staff Present Heath Lark RN, BSN, CCRP;Destynie Toomey BS, ACSM EP-C, Exercise Physiologist;Renee Dillard Essex MS, ACSM CEP Exercise Physiologist   ER physicians immediately available to respond to emergencies See telemetry face sheet for immediately available ER MD   Medication changes reported     No   Fall or balance concerns reported    No   Warm-up and Cool-down Performed on first and last piece of equipment   VAD Patient? No   Pain Assessment   Currently in Pain? No/denies         Goals Met:  Proper associated with RPD/PD & O2 Sat Exercise tolerated well No report of cardiac concerns or symptoms Strength training completed today  Goals Unmet:  Not Applicable  Goals Comments:    Dr. Emily Filbert is Medical Director for [place] and [facility].

## 2014-10-14 NOTE — Patient Instructions (Signed)
Medication Instructions:  Your physician has recommended you make the following change in your medication:  INCREASE hydralazine to 100mg  three times per day   Labwork: none  Testing/Procedures: .none  Follow-Up: Your physician recommends that you schedule a follow-up appointment in: three months with Dr. Ellyn Hack.    Any Other Special Instructions Will Be Listed Below (If Applicable). Weigh yourself every day, first thing in the morning. Call if weight gain BP check at 9/29 Echo

## 2014-10-15 ENCOUNTER — Telehealth: Payer: Self-pay | Admitting: Cardiology

## 2014-10-15 NOTE — Telephone Encounter (Signed)
Patient seen 10-14-14 and Ellyn Hack increased bp meds.  Patient had increased pain during workout , sob increased when walking, and patient feels a little dizzy.

## 2014-10-16 ENCOUNTER — Encounter: Payer: Medicare Other | Admitting: *Deleted

## 2014-10-16 ENCOUNTER — Encounter: Payer: Self-pay | Admitting: Cardiology

## 2014-10-16 DIAGNOSIS — I208 Other forms of angina pectoris: Secondary | ICD-10-CM | POA: Insufficient documentation

## 2014-10-16 DIAGNOSIS — I2089 Other forms of angina pectoris: Secondary | ICD-10-CM | POA: Insufficient documentation

## 2014-10-16 DIAGNOSIS — Z9861 Coronary angioplasty status: Secondary | ICD-10-CM

## 2014-10-16 DIAGNOSIS — I214 Non-ST elevation (NSTEMI) myocardial infarction: Secondary | ICD-10-CM

## 2014-10-16 NOTE — Assessment & Plan Note (Signed)
I do think that some of his chest pain symptoms are non-anginal in nature and just atypical for cardiac etiology. It makes it difficult because the symptoms often overlap with true anginal symptoms.

## 2014-10-16 NOTE — Assessment & Plan Note (Signed)
He actually is doing pretty well overall from a heart failure standpoint. Denies any significant edema or PND/orthopnea. Maybe intermittently but nothing significant. He does have some exertional dyspnea which was EF of 25% is not unreasonable. He needs additional afterload reduction so we will increase his hydralazine to 100 mg 3 times a day plus Imdur. I cannot increase his carvedilol and further due to bradycardia. He takes torsemide only 20 mg a day and states that his weight has been relatively stable plus or -20 pounds. We talked about sliding scale.

## 2014-10-16 NOTE — Assessment & Plan Note (Signed)
Overall, he may have some exertional angina which is expected base of the stented coronary disease he has. At this point don't think a stress test would be warranted. He is on regimen including beta blocker, statin, aspirin plus Plavix as well as hydralazine and nitrates. I don't think he is currently doing cardiac rehabilitation, and is still exercising.  He is discharged. Is not able to get out and play [employer] yet -- not sure this is more related to angina or heart failure.

## 2014-10-16 NOTE — Assessment & Plan Note (Signed)
With an EF of 25-30% and chronic long-standing ischemic coronary disease, I doubt that his EF will improve much.  I would like to optimize his management with afterload reduction and optimal diuresis prior to rechecking echocardiogram. Low threshold for referral to Electrophysiology for consideration of ICD.

## 2014-10-16 NOTE — Telephone Encounter (Signed)
S/w pt who states yesterday after exercising at the gym, he felt increased SOB which resolved. He walked outside in the heat as well and thinks he may have overdone it. States today at [place], he felt ok. Able to exercise with no symptoms. We reviewed symptoms that would need immediate attention in the ER and discussed slowing down when SOB to see if he recovers on his own. Pt states he feels fine today, verbalized understanding with no further questions. Confirmed 9/29 echo appt.

## 2014-10-16 NOTE — Assessment & Plan Note (Signed)
Based on having a baseline creatinine in the 4.0 range, I would be reluctant to do any type of invasive evaluation or treatment unless he has significant progressively worsening symptoms.

## 2014-10-16 NOTE — Assessment & Plan Note (Signed)
Poorly controlled. Increase hydralazine 100 mg 3 times a day. Unable to titrate her beta blocker further due to bradycardia while in the hospital. Next option will be to start amlodipine.

## 2014-10-16 NOTE — Progress Notes (Signed)
Daily Session Note  Patient Details  Name: JABREEL CHIMENTO MRN: [id number] Date of Birth: 1945-07-15 Referring Provider:  Leonie Man, MD  Encounter Date: 10/16/2014  Check In:     Session Check In - 10/16/14 1619    Check-In   Staff Present Heath Lark RN, BSN, CCRP;Stacey Blanch Media RRT, RCP Respiratory Therapist;Renee Dillard Essex MS, ACSM CEP Exercise Physiologist   ER physicians immediately available to respond to emergencies See telemetry face sheet for immediately available ER MD   Medication changes reported     No   Fall or balance concerns reported    No   Warm-up and Cool-down Performed on first and last piece of equipment   VAD Patient? No   Pain Assessment   Currently in Pain? No/denies         Goals Met:  Independence with exercise equipment Exercise tolerated well No report of cardiac concerns or symptoms Strength training completed today  Goals Unmet:  Not Applicable  Goals Comments: Doing well with exercise prescription progression.    Dr. Emily Filbert is Medical Director for [place] and [facility].

## 2014-10-16 NOTE — Assessment & Plan Note (Signed)
Pretty much class I stable angina -- mostly exertional but not totally limiting. He is on long-acting nitrate plus full dose for Ranexa.  For now I think the best option is continue medical management without any stress testing in the absence of progressive symptoms. Continued blood pressure control for afterload reduction to treat diastolic failure related angina.

## 2014-10-19 ENCOUNTER — Encounter: Payer: Medicare Other | Admitting: *Deleted

## 2014-10-19 DIAGNOSIS — Z9861 Coronary angioplasty status: Secondary | ICD-10-CM

## 2014-10-19 DIAGNOSIS — I214 Non-ST elevation (NSTEMI) myocardial infarction: Secondary | ICD-10-CM | POA: Diagnosis not present

## 2014-10-19 NOTE — Progress Notes (Signed)
Daily Session Note  Patient Details  Name: KAYDAN WONG MRN: [id number] Date of Birth: May 22, 1945 Referring Provider:  Leonie Man, MD  Encounter Date: 10/19/2014  Check In:     Session Check In - 10/19/14 0826    Check-In   Staff Present Candiss Norse MS, ACSM CEP Exercise Physiologist;Susanne Bice RN, BSN, CCRP;Martie Muhlbauer Alfonso Patten, ACSM CEP Exercise Physiologist   ER physicians immediately available to respond to emergencies See telemetry face sheet for immediately available ER MD   Medication changes reported     No   Fall or balance concerns reported    No   Warm-up and Cool-down Performed on first and last piece of equipment   VAD Patient? No   Pain Assessment   Currently in Pain? No/denies   Multiple Pain Sites No         Goals Met:  Independence with exercise equipement Exercise tolerated well  Goals Unmet:  Not Applicable  Goals Comments:  Patient experienced chest pain at pain level 1 on NS when he was exercising at level 5. After decreasing his workload to level 4 the pain was resolved completely.    Dr. Emily Filbert is Medical Director for [place] and [facility].

## 2014-10-20 ENCOUNTER — Encounter: Payer: Self-pay | Admitting: *Deleted

## 2014-10-20 DIAGNOSIS — Z9861 Coronary angioplasty status: Secondary | ICD-10-CM

## 2014-10-20 DIAGNOSIS — I214 Non-ST elevation (NSTEMI) myocardial infarction: Secondary | ICD-10-CM

## 2014-10-20 NOTE — Progress Notes (Signed)
Cardiac Individual Treatment Plan  Patient Details  Name: Joseph [name] MRN: [id number] Date of Birth: 1945-12-07 Referring Provider:  Leonie Man, MD  Initial Encounter Date:    Visit Diagnosis: NSTEMI (non-ST elevated myocardial infarction)  S/P PTCA (percutaneous transluminal coronary angioplasty)  Patient's Home Medications on Admission:  Current outpatient prescriptions:  .  aspirin 81 MG tablet, Take 81 mg by mouth daily., Disp: , Rfl:  .  BEE POLLEN PO, Take by mouth daily. 1 tsp, Disp: , Rfl:  .  calcitRIOL (ROCALTROL) 0.25 MCG capsule, Take 0.25 mcg by mouth daily. , Disp: , Rfl:  .  carvedilol (COREG) 12.5 MG tablet, Take 0.5 tablets (6.25 mg total) by mouth 2 (two) times daily with a meal. (Patient taking differently: Take 12.5 mg by mouth 2 (two) times daily with a meal. ), Disp: 60 tablet, Rfl: 2 .  clopidogrel (PLAVIX) 75 MG tablet, Take 1 tablet (75 mg total) by mouth daily., Disp: 90 tablet, Rfl: 3 .  ferrous sulfate 325 (65 FE) MG EC tablet, Take 325 mg by mouth daily with breakfast. , Disp: , Rfl:  .  hydrALAZINE (APRESOLINE) 100 MG tablet, Take 0.5 tablets (50 mg total) by mouth 3 (three) times daily., Disp: 90 tablet, Rfl: 3 .  isosorbide mononitrate (IMDUR) 60 MG 24 hr tablet, Take 1.5 tablets (90 mg total) by mouth daily. (Patient taking differently: Take by mouth. Takes 60 mg am and 30 mg pm daily.), Disp: 45 tablet, Rfl: 6 .  LANTUS SOLOSTAR 100 UNIT/ML Solostar Pen, Inject 20 Units into the skin at bedtime. , Disp: , Rfl:  .  LYRICA 75 MG capsule, Take 75 mg by mouth 2 (two) times daily. , Disp: , Rfl: 0 .  nitroGLYCERIN (NITROLINGUAL) 0.4 MG/SPRAY spray, Place 1 spray under the tongue every 5 (five) minutes x 3 doses as needed for chest pain. Do not use together with sublingual nitro, Disp: 12 g, Rfl: 5 .  NITROSTAT 0.4 MG SL tablet, place 1 tablet under the tongue if needed every 5 minutes for che...  (REFER TO PRESCRIPTION NOTES)., Disp: , Rfl: 0 .   Omega-3 Fatty Acids (FISH OIL) 1200 MG CAPS, Take 1 capsule by mouth daily., Disp: , Rfl:  .  ranolazine (RANEXA) 1000 MG SR tablet, Take 1 tablet (1,000 mg total) by mouth 2 (two) times daily., Disp: 60 tablet, Rfl: 6 .  rosuvastatin (CRESTOR) 20 MG tablet, Take 20 mg by mouth daily., Disp: , Rfl:  .  sodium bicarbonate 650 MG tablet, Take 650 mg by mouth 2 (two) times daily. , Disp: , Rfl:  .  sodium polystyrene (KAYEXALATE) 15 GM/60ML suspension, Take 15 g by mouth 3 (three) times a week. Mon / Wed / Fri, Disp: , Rfl:  .  Spirulina 500 MG TABS, Take 1 tablet by mouth daily., Disp: , Rfl:  .  torsemide (DEMADEX) 20 MG tablet, Take 1 tablet (20 mg total) by mouth daily. (Patient taking differently: Take 10 mg by mouth daily. ), Disp: 30 tablet, Rfl: 11  Past Medical History: Past Medical History  Diagnosis Date  . Hypertension   . Hypercholesterolemia   . Prostate cancer   . Enlarged liver   . Spleen enlarged   . Thrombocytopenia   . Myocardial infarction   . Anxiety   . Depression   . Shortness of breath   . Diabetes mellitus without complication     INSULIN DEPENDENT  . GERD (gastroesophageal reflux disease)   . History of  shingles   . Ischemic cardiomyopathy     a. echo 08/06/2014: EF 25-30%, multiple WMA, mod DD, PASP 49 mm Hg   . CKD (chronic kidney disease), stage IV     a. previously on dialysis; b. followed by Dr. Abigail Butts   . Coronary artery disease, occlusive     a. s/p 4v CABG 2001(LIMA-LAD, SVG-D2, SVG-OM1, SVG-right PDA, SVG-right PL1); b. cath 07/30/2014 vein grafts all down, patent LIMA to LAD, significant LM and ost LCx dx; c. cath 08/07/2014 Synergy DES 2.5 mm x 18 mm to dist LM and ost LCx, rec lifelong DAPT  . Coronary artery disease involving coronary bypass graft 08/2014    all vein grafts occluded. Patent LIMA-LAD -- s/p PCI ot LM-Cx;   [name] Chronic combined systolic and diastolic CHF, NYHA class 2     a. echo 08/2013: EF 40-45%, impaired relaxation, mild MR, LA mildly  dilated,      Tobacco Use: History  Smoking status  . Former Smoker  . Quit date: 02/07/2008  Smokeless tobacco  . 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 Participant demonstrates ability to understand and report RPE using BORG scale, to self-measure pulse accurately, and to acknowledge the importance of the exercise prescription.  Exercise Prescription Goal: Starting with aerobic activity 30 plus minutes a day, 3 days per week for initial exercise prescription. Provide home exercise prescription and guidelines that participant acknowledges understanding prior to discharge.  Activity Barriers & Risk Stratification:     Activity Barriers & Risk Stratification - 08/24/14 1337    Activity Barriers & Risk Stratification   Activity Barriers None   Risk Stratification High      6 Minute Walk:     6 Minute Walk      08/24/14 1448       6 Minute Walk   Phase Initial     Distance 1265 feet     Walk Time 6 minutes     Resting HR 59 bpm     Resting BP 130/70 mmHg     Max Ex. HR 100 bpm     Max Ex. BP 160/78 mmHg     RPE 11     Symptoms Yes (comment)     Comments Patient had chest pain at pain level of 2 during walk. He informed me of this pain after the walk was over and said that pain was relieved once he stopped walking.         Initial Exercise Prescription:     Initial Exercise Prescription - 08/24/14 1400    Date of Initial Exercise Prescription   Date 08/24/14   Treadmill   MPH 2.3   Grade 0   Minutes 15   Bike   Level 0.8   Minutes 15   Recumbant Bike   Level 2   RPM 40   Watts  20   Minutes 15   NuStep   Level 3   Watts 30   Minutes 15   Arm Ergometer   Level 1  Watts 10   Minutes 15   Arm/Foot Ergometer   Level 1   Watts 12   Minutes 15   Cybex   Level 1   RPM 40   Minutes 15   Recumbant Elliptical   Level 1   RPM 40   Watts 20   Minutes 15   Elliptical   Level 1   Speed 3   Minutes 5   REL-XR   Level 3   Watts 30   Minutes 15   Prescription Details   Frequency (times per week) 3   Duration Progress to 30 minutes of continuous aerobic without signs/symptoms of physical distress   Intensity   THRR REST +  30   Ratings of Perceived Exertion 11-15   Progression Continue progressive overload as per policy without signs/symptoms or physical distress.   Resistance Training   Training Prescription Yes   Weight 2   Reps 10-12      Exercise Prescription Changes:     Exercise Prescription Changes      08/31/14 0900 09/09/14 0900 09/15/14 0600 10/07/14 0800 10/20/14 0600   Exercise Review   Progression _0    Response to Exercise   Blood Pressure (Admit)   132/78 mmHg 132/78 mmHg 136/64 mmHg   Blood Pressure (Exercise)   150/70 mmHg 150/70 mmHg 138/64 mmHg   Blood Pressure (Exit)   140/70 mmHg 140/70 mmHg 140/70 mmHg   Heart Rate (Admit)   78 bpm 78 bpm 74 bpm   Heart Rate (Exercise)   85 bpm 85 bpm 83 bpm   Heart Rate (Exit)   83 bpm 83 bpm 65 bpm   Rating of Perceived Exertion (Exercise)   _1 Symptoms   Experiencing chest pain when walking on the treadmill. Allieviated with rest or stopping.  Experiencing chest pain when walking on the treadmill. Allieviated with rest or stopping.  Has angina with exercise, angina is relieved when exercise is stopped or slowed   Comments   MD cleared him to resume playing pickleball MD cleared him to resume playing pickleball MD cleared him to resume playing pickleball and to exercise with angina   Frequency   Add 1 additional day to program exercise sessions. Add 1 additional  day to program exercise sessions. Add 1 additional day to program exercise sessions.   Duration Progress to 30 minutes of continuous aerobic without signs/symptoms of physical distress Progress to 30 minutes of continuous aerobic without signs/symptoms of physical distress Progress to 30 minutes of continuous aerobic without signs/symptoms of physical distress Progress to 30 minutes of continuous aerobic without signs/symptoms of physical distress Progress to 30 minutes of continuous aerobic without signs/symptoms of physical distress   Intensity Rest + 30 Rest + 30 Rest + 30 Rest + 30 Rest + 30   Progression Continue progressive overload as per policy without signs/symptoms or physical distress. Continue progressive overload as per policy without signs/symptoms or physical distress. Continue progressive overload as per policy without signs/symptoms or physical distress. Continue progressive overload as per policy without signs/symptoms or physical distress. Continue progressive overload as per policy without signs/symptoms or physical distress.   Resistance Training   Training Prescription _2    Weight _3 Reps 10-15 10-15 10-15 10-15 10-15   Interval Training   Interval Training _4    Treadmill   MPH 2.4 2.4 2.7 2.7 2.7   Grade  0 0 0 0 0   Minutes _0 NuStep   Level   _1 Watts   60 60 60   Minutes   _2 REL-XR   Level   _3 Watts   65 65 65   Minutes   _4 Discharge Exercise Prescription (Final Exercise Prescription Changes):     Exercise Prescription Changes - 10/20/14 0600    Exercise Review   Progression Yes   Response to Exercise   Blood Pressure (Admit) 136/64 mmHg   Blood Pressure (Exercise) 138/64 mmHg   Blood Pressure (Exit) 140/70 mmHg   Heart Rate (Admit) 74 bpm   Heart Rate (Exercise) 83 bpm   Heart Rate (Exit) 65 bpm   Rating of Perceived Exertion (Exercise) 12   Symptoms Has angina with  exercise, angina is relieved when exercise is stopped or slowed   Comments MD cleared him to resume playing pickleball and to exercise with angina   Frequency Add 1 additional day to program exercise sessions.   Duration Progress to 30 minutes of continuous aerobic without signs/symptoms of physical distress   Intensity Rest + 30   Progression Continue progressive overload as per policy without signs/symptoms or physical distress.   Resistance Training   Training Prescription Yes   Weight 6   Reps 10-15   Interval Training   Interval Training No   Treadmill   MPH 2.7   Grade 0   Minutes 20   NuStep   Level 6   Watts 60   Minutes 20   REL-XR   Level 6   Watts 65   Minutes 15      Nutrition:  Target Goals: Understanding of nutrition guidelines, daily intake of sodium <1585m, cholesterol <2053m calories 30% from fat and 7% or less from saturated fats, daily to have 5 or more servings of fruits and vegetables.  Biometrics:     Pre Biometrics - 08/24/14 1459    Pre Biometrics   Height _5  (1.778 m)   Weight 223 lb (101.152 kg)   Waist Circumference 44.5 inches   Hip Circumference 41 inches   Waist to Hip Ratio 1.09 %   BMI (Calculated) 32.1       Nutrition Therapy Plan and Nutrition Goals:     Nutrition Therapy & Goals - 09/03/14 1151    Nutrition Therapy   Diet --  Mr. RoJeanbaptisteoes not wish to meet with dietitian at this time.      Nutrition Discharge: Rate Your Plate Scores:   Nutrition Goals Re-Evaluation:   Psychosocial: Target Goals: Acknowledge presence or absence of depression, maximize coping skills, provide positive support system. Participant is able to verbalize types and ability to use techniques and skills needed for reducing stress and depression.  Initial Review & Psychosocial Screening:     Initial Psych Review & Screening - 08/24/14 1358    Screening Interventions   Interventions Encouraged to exercise;Program counselor consult       Quality of Life Scores:     Quality of Life - 08/25/14 0738    Quality of Life Scores   Health/Function Pre 27.86 %   Socioeconomic Pre 30 %   Psych/Spiritual Pre 30 %   Family Pre 30 %   GLOBAL Pre 29.03 %      PHQ-9:     Recent Review Flowsheet Data  Depression screen [facility] 2/9 08/24/2014   Decreased Interest 0   Down, Depressed, Hopeless 0   PHQ - 2 Score 0   Altered sleeping 0   Tired, decreased energy 0   Change in appetite 0   Feeling bad or failure about yourself  0   Trouble concentrating 0   Moving slowly or fidgety/restless 0   Suicidal thoughts 0   PHQ-9 Score 0      Psychosocial Evaluation and Intervention:     Psychosocial Evaluation - 08/31/14 1017    Psychosocial Evaluation & Interventions   Interventions Stress management education;Relaxation education;Encouraged to exercise with the program and follow exercise prescription   Comments Counselor met with Mr. Denn today for initial psychosocial evaluation.  He is a 69 year old individual who had a stent inserted approximately 3 weeks ago.  He lives alone but has a strong support system with (2) adult daughters who live close by and he is actively involved in his local church.  Mr. Proia has other health issues including diabetes, and kidney failure.  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  He reports sleeping well and has a good appetite.  He denies current depressive symptoms, but admits he experienced some untreated depression when his spouse left 7 years ago.  Mr. Thomann is typically in a positive mood and reports minimal stress in his life currently.  His goals are to get his stamina and strength back in order to get back on the "pickle court" (like indoor tennis) as soon as possible.     Continued Psychosocial Services Needed Yes  Mr. Poirier will benefit from all of the psychoeducational components of this program and consistent exercise.         Psychosocial Re-Evaluation:   Vocational Rehabilitation: Provide vocational rehab assistance to qualifying candidates.   Vocational Rehab Evaluation & Intervention:     Vocational Rehab - 08/24/14 1338    Initial Vocational Rehab Evaluation & Intervention   Assessment shows need for Vocational Rehabilitation No      Education: Education Goals: Education classes will be provided on a weekly basis, covering required topics. Participant will state understanding/return demonstration of topics presented.  Learning Barriers/Preferences:     Learning Barriers/Preferences - 08/24/14 1338    Learning Barriers/Preferences   Learning Barriers None   Learning Preferences None      Education Topics: General Nutrition Guidelines/Fats and Fiber: -Group instruction provided by verbal, written material, models and posters to present the general guidelines for heart healthy nutrition. Gives an explanation and review of dietary fats and fiber.   Controlling Sodium/Reading Food Labels: -Group verbal and written material supporting the discussion of sodium use in heart healthy nutrition. Review and explanation with models, verbal and written materials for utilization of the food label.   Exercise Physiology & Risk Factors: - Group verbal and written instruction with models to review the exercise physiology of the cardiovascular system and associated critical values. Details cardiovascular disease risk factors and the goals associated with each risk factor.          Cardiac Rehab from 10/14/2014 in [facility] Cardiac Rehab   Date  10/14/14   Educator  RM   Instruction Review Code  2- meets goals/outcomes      Aerobic Exercise & Resistance Training: - Gives group verbal and written discussion on the health impact of inactivity. On the components of aerobic and resistive training programs and the benefits of this training  and how to safely progress through these programs.   Flexibility,  Balance, General Exercise Guidelines: - Provides group verbal and written instruction on the benefits of flexibility and balance training programs. Provides general exercise guidelines with specific guidelines to those with heart or lung disease. Demonstration and skill practice provided.   Stress Management: - Provides group verbal and written instruction about the health risks of elevated stress, cause of high stress, and healthy ways to reduce stress.   Depression: - Provides group verbal and written instruction on the correlation between heart/lung disease and depressed mood, treatment options, and the stigmas associated with seeking treatment.   Anatomy & Physiology of the Heart: - Group verbal and written instruction and models provide basic cardiac anatomy and physiology, with the coronary electrical and arterial systems. Review of: AMI, Angina, Valve disease, Heart Failure, Cardiac Arrhythmia, Pacemakers, and the ICD.   Cardiac Procedures: - Group verbal and written instruction and models to describe the testing methods done to diagnose heart disease. Reviews the outcomes of the test results. Describes the treatment choices: Medical Management, Angioplasty, or Coronary Bypass Surgery.   Cardiac Medications: - Group verbal and written instruction to review commonly prescribed medications for heart disease. Reviews the medication, class of the drug, and side effects. Includes the steps to properly store meds and maintain the prescription regimen.   Go Sex-Intimacy & Heart Disease, Get SMART - Goal Setting: - Group verbal and written instruction through game format to discuss heart disease and the return to sexual intimacy. Provides group verbal and written material to discuss and apply goal setting through the application of the S.M.A.R.T. Method.   Other Matters of the Heart: - Provides group verbal, written materials and models to describe Heart Failure, Angina, Valve Disease,  and Diabetes in the realm of heart disease. Includes description of the disease process and treatment options available to the cardiac patient.   Exercise & Equipment Safety: - Individual verbal instruction and demonstration of equipment use and safety with use of the equipment.      Cardiac Rehab from 10/14/2014 in [facility] Cardiac Rehab   Date  08/24/14   Educator  SB   Instruction Review Code  2- meets goals/outcomes      Infection Prevention: - Provides verbal and written material to individual with discussion of infection control including proper hand washing and proper equipment cleaning during exercise session.      Cardiac Rehab from 10/14/2014 in [facility] Cardiac Rehab   Date  08/24/14   Educator  SB   Instruction Review Code  2- meets goals/outcomes      Falls Prevention: - Provides verbal and written material to individual with discussion of falls prevention and safety.      Cardiac Rehab from 10/14/2014 in [facility] Cardiac Rehab   Date  08/24/14   Educator  SB   Instruction Review Code  2- meets goals/outcomes      Diabetes: - Individual verbal and written instruction to review signs/symptoms of diabetes, desired ranges of glucose level fasting, after meals and with exercise. Advice that pre and post exercise glucose checks will be done for 3 sessions at entry of program.      Cardiac Rehab from 10/14/2014 in [facility] Cardiac Rehab   Date  08/24/14   Educator  SB   Instruction Review Code  2- meets goals/outcomes       Knowledge Questionnaire Score:     Knowledge Questionnaire Score - 08/25/14 0713    Knowledge Questionnaire Score  Pre Score 26/28      Personal Goals and Risk Factors at Admission:     Personal Goals and Risk Factors at Admission - 08/24/14 1348    Personal Goals and Risk Factors on Admission   Increase Aerobic Exercise and Physical Activity Yes   Intervention While in program, learn and follow the exercise prescription taught. Start at a low level workload  and increase workload after able to maintain previous level for 30 minutes. Increase time before increasing intensity.   Diabetes Yes   Goal Blood glucose control identified by blood glucose values, HgbA1C. Participant verbalizes understanding of the signs/symptoms of hyper/hypo glycemia, proper foot care and importance of medication and nutrition plan for blood glucose control.   Intervention Provide nutrition & aerobic exercise along with prescribed medications to achieve blood glucose in normal ranges: Fasting 65-99 mg/dL   Hypertension Yes   Goal Participant will see blood pressure controlled within the values of 140/17m/Hg or within value directed by their physician.   Intervention Provide nutrition & aerobic exercise along with prescribed medications to achieve BP 140/90 or less.   Lipids Yes   Goal Cholesterol controlled with medications as prescribed, with individualized exercise RX and with personalized nutrition plan. Value goals: LDL < 718m HDL > 4046mParticipant states understanding of desired cholesterol values and following prescriptions.   Intervention Provide nutrition & aerobic exercise along with prescribed medications to achieve LDL <33m20mDL >40mg64m   Personal Goals and Risk Factors Review:      Goals and Risk Factor Review      08/26/14 1202 09/11/14 1207 10/14/14 1143       Weight Management   Goals Progress/Improvement seen  Yes Yes     Comments  Doing well with weight managment. A few pounds down since admission.  Is down 3 lbs since starting program. Wants to get down to 200.     Increase Aerobic Exercise and Physical Activity   Goals Progress/Improvement seen  No No Yes     Comments Goals Comments: Had intermittent chest discomfort "not like my heart attack" after approx 2 minutes of exercising on the treadmill. 2.2mph 28m incline. I called Dr. GollanDonivan Sculle but they were unable to see him so recommended ER which I passed that info along. [facility] and they recommended Emerg Dept. Also. With rest [place] have any further chest pain. I left a vm for his daughter also after I asked his permission to call her.  COntinues to have angina symptoms, especially when on the treadmill. Has appointment this afternoon with doctor. WIll talk to his doctor about his recurrent symptoms.  Is able to do more exercise and also exercises 3d/wk that he doesn't come to class. His chest pain is still limiting him a lot and his doctor told him to exercise up to the angina threshold. He is still very frustrated with this.      Diabetes   Goal  Blood glucose control identified by blood glucose values, HgbA1C. Participant verbalizes understanding of the signs/symptoms of hyper/hypo glycemia, proper foot care and importance of medication and nutrition plan for blood glucose control.      Progress seen towards goals  Yes      Comments  States blood sugar levels are controlled      Hypertension   Goal  Participant will see blood pressure controlled within the values of 140/90mm/H81m within value directed  by their physician.      Progress seen toward goals  Yes      Comments  BP in good range      Abnormal Lipids   Goal  Cholesterol controlled with medications as prescribed, with individualized exercise RX and with personalized nutrition plan. Value goals: LDL < 73m, HDL > 485m Participant states understanding of desired cholesterol values and following prescriptions.      Progress seen towards goals  Unknown      Comments  no lab values to compare         Personal Goals Discharge:     Comments: 30 day review Continue with current ITP.

## 2014-10-21 ENCOUNTER — Encounter: Payer: Self-pay | Admitting: *Deleted

## 2014-10-21 DIAGNOSIS — I214 Non-ST elevation (NSTEMI) myocardial infarction: Secondary | ICD-10-CM

## 2014-10-21 DIAGNOSIS — Z9861 Coronary angioplasty status: Secondary | ICD-10-CM

## 2014-10-21 NOTE — Progress Notes (Signed)
Cardiac Individual Treatment Plan  Patient Details  Name: Joseph [name] MRN: [id number] Date of Birth: 01/10/1946 Referring Provider:  Harding, David W, MD  Initial Encounter Date:    Visit Diagnosis: NSTEMI (non-ST elevated myocardial infarction)  S/P PTCA (percutaneous transluminal coronary angioplasty)  Patient's Home Medications on Admission:  Current outpatient prescriptions:  .  aspirin 81 MG tablet, Take 81 mg by mouth daily., Disp: , Rfl:  .  BEE POLLEN PO, Take by mouth daily. 1 tsp, Disp: , Rfl:  .  calcitRIOL (ROCALTROL) 0.25 MCG capsule, Take 0.25 mcg by mouth daily. , Disp: , Rfl:  .  carvedilol (COREG) 12.5 MG tablet, Take 0.5 tablets (6.25 mg total) by mouth 2 (two) times daily with a meal. (Patient taking differently: Take 12.5 mg by mouth 2 (two) times daily with a meal. ), Disp: 60 tablet, Rfl: 2 .  clopidogrel (PLAVIX) 75 MG tablet, Take 1 tablet (75 mg total) by mouth daily., Disp: 90 tablet, Rfl: 3 .  ferrous sulfate 325 (65 FE) MG EC tablet, Take 325 mg by mouth daily with breakfast. , Disp: , Rfl:  .  hydrALAZINE (APRESOLINE) 100 MG tablet, Take 0.5 tablets (50 mg total) by mouth 3 (three) times daily., Disp: 90 tablet, Rfl: 3 .  isosorbide mononitrate (IMDUR) 60 MG 24 hr tablet, Take 1.5 tablets (90 mg total) by mouth daily. (Patient taking differently: Take by mouth. Takes 60 mg am and 30 mg pm daily.), Disp: 45 tablet, Rfl: 6 .  LANTUS SOLOSTAR 100 UNIT/ML Solostar Pen, Inject 20 Units into the skin at bedtime. , Disp: , Rfl:  .  LYRICA 75 MG capsule, Take 75 mg by mouth 2 (two) times daily. , Disp: , Rfl: 0 .  nitroGLYCERIN (NITROLINGUAL) 0.4 MG/SPRAY spray, Place 1 spray under the tongue every 5 (five) minutes x 3 doses as needed for chest pain. Do not use together with sublingual nitro, Disp: 12 g, Rfl: 5 .  NITROSTAT 0.4 MG SL tablet, place 1 tablet under the tongue if needed every 5 minutes for che...  (REFER TO PRESCRIPTION NOTES)., Disp: , Rfl: 0 .   Omega-3 Fatty Acids (FISH OIL) 1200 MG CAPS, Take 1 capsule by mouth daily., Disp: , Rfl:  .  ranolazine (RANEXA) 1000 MG SR tablet, Take 1 tablet (1,000 mg total) by mouth 2 (two) times daily., Disp: 60 tablet, Rfl: 6 .  rosuvastatin (CRESTOR) 20 MG tablet, Take 20 mg by mouth daily., Disp: , Rfl:  .  sodium bicarbonate 650 MG tablet, Take 650 mg by mouth 2 (two) times daily. , Disp: , Rfl:  .  sodium polystyrene (KAYEXALATE) 15 GM/60ML suspension, Take 15 g by mouth 3 (three) times a week. Mon / Wed / Fri, Disp: , Rfl:  .  Spirulina 500 MG TABS, Take 1 tablet by mouth daily., Disp: , Rfl:  .  torsemide (DEMADEX) 20 MG tablet, Take 1 tablet (20 mg total) by mouth daily. (Patient taking differently: Take 10 mg by mouth daily. ), Disp: 30 tablet, Rfl: 11  Past Medical History: Past Medical History  Diagnosis Date  . Hypertension   . Hypercholesterolemia   . Prostate cancer   . Enlarged liver   . Spleen enlarged   . Thrombocytopenia   . Myocardial infarction   . Anxiety   . Depression   . Shortness of breath   . Diabetes mellitus without complication     INSULIN DEPENDENT  . GERD (gastroesophageal reflux disease)   . History of   shingles   . Ischemic cardiomyopathy     a. echo 08/06/2014: EF 25-30%, multiple WMA, mod DD, PASP 49 mm Hg   . CKD (chronic kidney disease), stage IV     a. previously on dialysis; b. followed by Dr. Kolloru   . Coronary artery disease, occlusive     a. s/p 4v CABG 2001(LIMA-LAD, SVG-D2, SVG-OM1, SVG-right PDA, SVG-right PL1); b. cath 07/30/2014 vein grafts all down, patent LIMA to LAD, significant LM and ost LCx dx; c. cath 08/07/2014 Synergy DES 2.5 mm x 18 mm to dist LM and ost LCx, rec lifelong DAPT  . Coronary artery disease involving coronary bypass graft 08/2014    all vein grafts occluded. Patent LIMA-LAD -- s/p PCI ot LM-Cx;   . Chronic combined systolic and diastolic CHF, NYHA class 2     a. echo 08/2013: EF 40-45%, impaired relaxation, mild MR, LA mildly  dilated,      Tobacco Use: History  Smoking status  . Former Smoker  . Quit date: 02/07/2008  Smokeless tobacco  . Former User    Labs: Recent Review Flowsheet Data    Labs for ITP Cardiac and Pulmonary Rehab Latest Ref Rng 04/24/2013 04/25/2013   Cholestrol 0-200 mg/dL - 97   LDLCALC 0-100 mg/dL - 41   HDL 40-60 mg/dL - 33(L)   Trlycerides 0-200 mg/dL - 113   Hemoglobin A1c 4.2-6.3 % 7.0(H) -       Exercise Target Goals:    Exercise Program Goal: Individual exercise prescription set with THRR, safety & activity barriers. Participant demonstrates ability to understand and report RPE using BORG scale, to self-measure pulse accurately, and to acknowledge the importance of the exercise prescription.  Exercise Prescription Goal: Starting with aerobic activity 30 plus minutes a day, 3 days per week for initial exercise prescription. Provide home exercise prescription and guidelines that participant acknowledges understanding prior to discharge.  Activity Barriers & Risk Stratification:     Activity Barriers & Risk Stratification - 08/24/14 1337    Activity Barriers & Risk Stratification   Activity Barriers None   Risk Stratification High      6 Minute Walk:     6 Minute Walk      08/24/14 1448       6 Minute Walk   Phase Initial     Distance 1265 feet     Walk Time 6 minutes     Resting HR 59 bpm     Resting BP 130/70 mmHg     Max Ex. HR 100 bpm     Max Ex. BP 160/78 mmHg     RPE 11     Symptoms Yes (comment)     Comments Patient had chest pain at pain level of 2 during walk. He informed me of this pain after the walk was over and said that pain was relieved once he stopped walking.         Initial Exercise Prescription:     Initial Exercise Prescription - 08/24/14 1400    Date of Initial Exercise Prescription   Date 08/24/14   Treadmill   MPH 2.3   Grade 0   Minutes 15   Bike   Level 0.8   Minutes 15   Recumbant Bike   Level 2   RPM 40   Watts  20   Minutes 15   NuStep   Level 3   Watts 30   Minutes 15   Arm Ergometer   Level 1     Watts 10   Minutes 15   Arm/Foot Ergometer   Level 1   Watts 12   Minutes 15   Cybex   Level 1   RPM 40   Minutes 15   Recumbant Elliptical   Level 1   RPM 40   Watts 20   Minutes 15   Elliptical   Level 1   Speed 3   Minutes 5   REL-XR   Level 3   Watts 30   Minutes 15   Prescription Details   Frequency (times per week) 3   Duration Progress to 30 minutes of continuous aerobic without signs/symptoms of physical distress   Intensity   THRR REST +  30   Ratings of Perceived Exertion 11-15   Progression Continue progressive overload as per policy without signs/symptoms or physical distress.   Resistance Training   Training Prescription Yes   Weight 2   Reps 10-12      Exercise Prescription Changes:     Exercise Prescription Changes      08/31/14 0900 09/09/14 0900 09/15/14 0600 10/07/14 0800 10/20/14 0600   Exercise Review   Progression Yes Yes Yes Yes Yes   Response to Exercise   Blood Pressure (Admit)   132/78 mmHg 132/78 mmHg 136/64 mmHg   Blood Pressure (Exercise)   150/70 mmHg 150/70 mmHg 138/64 mmHg   Blood Pressure (Exit)   140/70 mmHg 140/70 mmHg 140/70 mmHg   Heart Rate (Admit)   78 bpm 78 bpm 74 bpm   Heart Rate (Exercise)   85 bpm 85 bpm 83 bpm   Heart Rate (Exit)   83 bpm 83 bpm 65 bpm   Rating of Perceived Exertion (Exercise)   11 11 12   Symptoms   Experiencing chest pain when walking on the treadmill. Allieviated with rest or stopping.  Experiencing chest pain when walking on the treadmill. Allieviated with rest or stopping.  Has angina with exercise, angina is relieved when exercise is stopped or slowed   Comments   MD cleared him to resume playing pickleball MD cleared him to resume playing pickleball MD cleared him to resume playing pickleball and to exercise with angina   Frequency   Add 1 additional day to program exercise sessions. Add 1 additional  day to program exercise sessions. Add 1 additional day to program exercise sessions.   Duration Progress to 30 minutes of continuous aerobic without signs/symptoms of physical distress Progress to 30 minutes of continuous aerobic without signs/symptoms of physical distress Progress to 30 minutes of continuous aerobic without signs/symptoms of physical distress Progress to 30 minutes of continuous aerobic without signs/symptoms of physical distress Progress to 30 minutes of continuous aerobic without signs/symptoms of physical distress   Intensity Rest + 30 Rest + 30 Rest + 30 Rest + 30 Rest + 30   Progression Continue progressive overload as per policy without signs/symptoms or physical distress. Continue progressive overload as per policy without signs/symptoms or physical distress. Continue progressive overload as per policy without signs/symptoms or physical distress. Continue progressive overload as per policy without signs/symptoms or physical distress. Continue progressive overload as per policy without signs/symptoms or physical distress.   Resistance Training   Training Prescription Yes Yes Yes Yes Yes   Weight 2 3 4 4 6   Reps 10-15 10-15 10-15 10-15 10-15   Interval Training   Interval Training No No No No No   Treadmill   MPH 2.4 2.4 2.7 2.7 2.7   Grade   0 0 0 0 0   Minutes 15 15 20 20 20   NuStep   Level   6 6 6   Watts   60 60 60   Minutes   20 20 20   REL-XR   Level   6 6 6   Watts   65 65 65   Minutes   15 15 15      Discharge Exercise Prescription (Final Exercise Prescription Changes):     Exercise Prescription Changes - 10/20/14 0600    Exercise Review   Progression Yes   Response to Exercise   Blood Pressure (Admit) 136/64 mmHg   Blood Pressure (Exercise) 138/64 mmHg   Blood Pressure (Exit) 140/70 mmHg   Heart Rate (Admit) 74 bpm   Heart Rate (Exercise) 83 bpm   Heart Rate (Exit) 65 bpm   Rating of Perceived Exertion (Exercise) 12   Symptoms Has angina with  exercise, angina is relieved when exercise is stopped or slowed   Comments MD cleared him to resume playing pickleball and to exercise with angina   Frequency Add 1 additional day to program exercise sessions.   Duration Progress to 30 minutes of continuous aerobic without signs/symptoms of physical distress   Intensity Rest + 30   Progression Continue progressive overload as per policy without signs/symptoms or physical distress.   Resistance Training   Training Prescription Yes   Weight 6   Reps 10-15   Interval Training   Interval Training No   Treadmill   MPH 2.7   Grade 0   Minutes 20   NuStep   Level 6   Watts 60   Minutes 20   REL-XR   Level 6   Watts 65   Minutes 15      Nutrition:  Target Goals: Understanding of nutrition guidelines, daily intake of sodium <1500mg, cholesterol <200mg, calories 30% from fat and 7% or less from saturated fats, daily to have 5 or more servings of fruits and vegetables.  Biometrics:     Pre Biometrics - 08/24/14 1459    Pre Biometrics   Height 5' 10" (1.778 m)   Weight 223 lb (101.152 kg)   Waist Circumference 44.5 inches   Hip Circumference 41 inches   Waist to Hip Ratio 1.09 %   BMI (Calculated) 32.1       Nutrition Therapy Plan and Nutrition Goals:     Nutrition Therapy & Goals - 09/03/14 1151    Nutrition Therapy   Diet --  Mr. Balis does not wish to meet with dietitian at this time.      Nutrition Discharge: Rate Your Plate Scores:   Nutrition Goals Re-Evaluation:   Psychosocial: Target Goals: Acknowledge presence or absence of depression, maximize coping skills, provide positive support system. Participant is able to verbalize types and ability to use techniques and skills needed for reducing stress and depression.  Initial Review & Psychosocial Screening:     Initial Psych Review & Screening - 08/24/14 1358    Screening Interventions   Interventions Encouraged to exercise;Program counselor consult       Quality of Life Scores:     Quality of Life - 08/25/14 0738    Quality of Life Scores   Health/Function Pre 27.86 %   Socioeconomic Pre 30 %   Psych/Spiritual Pre 30 %   Family Pre 30 %   GLOBAL Pre 29.03 %      PHQ-9:     Recent Review Flowsheet Data      Depression screen PHQ 2/9 08/24/2014   Decreased Interest 0   Down, Depressed, Hopeless 0   PHQ - 2 Score 0   Altered sleeping 0   Tired, decreased energy 0   Change in appetite 0   Feeling bad or failure about yourself  0   Trouble concentrating 0   Moving slowly or fidgety/restless 0   Suicidal thoughts 0   PHQ-9 Score 0      Psychosocial Evaluation and Intervention:     Psychosocial Evaluation - 08/31/14 1017    Psychosocial Evaluation & Interventions   Interventions Stress management education;Relaxation education;Encouraged to exercise with the program and follow exercise prescription   Comments Counselor met with Mr. Dancel today for initial psychosocial evaluation.  He is a 68 year old individual who had a stent inserted approximately 3 weeks ago.  He lives alone but has a strong support system with (2) adult daughters who live close by and he is actively involved in his local church.  Mr. Sandall has other health issues including diabetes, and kidney failure.  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  He reports sleeping well and has a good appetite.  He denies current depressive symptoms, but admits he experienced some untreated depression when his spouse left 7 years ago.  Mr. Damaso is typically in a positive mood and reports minimal stress in his life currently.  His goals are to get his stamina and strength back in order to get back on the "pickle court" (like indoor tennis) as soon as possible.     Continued Psychosocial Services Needed Yes  Mr. Corprew will benefit from all of the psychoeducational components of this program and consistent exercise.         Psychosocial Re-Evaluation:   Vocational Rehabilitation: Provide vocational rehab assistance to qualifying candidates.   Vocational Rehab Evaluation & Intervention:     Vocational Rehab - 08/24/14 1338    Initial Vocational Rehab Evaluation & Intervention   Assessment shows need for Vocational Rehabilitation No      Education: Education Goals: Education classes will be provided on a weekly basis, covering required topics. Participant will state understanding/return demonstration of topics presented.  Learning Barriers/Preferences:     Learning Barriers/Preferences - 08/24/14 1338    Learning Barriers/Preferences   Learning Barriers None   Learning Preferences None      Education Topics: General Nutrition Guidelines/Fats and Fiber: -Group instruction provided by verbal, written material, models and posters to present the general guidelines for heart healthy nutrition. Gives an explanation and review of dietary fats and fiber.   Controlling Sodium/Reading Food Labels: -Group verbal and written material supporting the discussion of sodium use in heart healthy nutrition. Review and explanation with models, verbal and written materials for utilization of the food label.   Exercise Physiology & Risk Factors: - Group verbal and written instruction with models to review the exercise physiology of the cardiovascular system and associated critical values. Details cardiovascular disease risk factors and the goals associated with each risk factor.          Cardiac Rehab from 10/14/2014 in [facility]   Date  10/14/14   Educator  RM   Instruction Review Code  2- meets goals/outcomes      Aerobic Exercise & Resistance Training: - Gives group verbal and written discussion on the health impact of inactivity. On the components of aerobic and resistive training programs and the benefits of this training   and how to safely progress through these programs.   Flexibility,  Balance, General Exercise Guidelines: - Provides group verbal and written instruction on the benefits of flexibility and balance training programs. Provides general exercise guidelines with specific guidelines to those with heart or lung disease. Demonstration and skill practice provided.   Stress Management: - Provides group verbal and written instruction about the health risks of elevated stress, cause of high stress, and healthy ways to reduce stress.   Depression: - Provides group verbal and written instruction on the correlation between heart/lung disease and depressed mood, treatment options, and the stigmas associated with seeking treatment.   Anatomy & Physiology of the Heart: - Group verbal and written instruction and models provide basic cardiac anatomy and physiology, with the coronary electrical and arterial systems. Review of: AMI, Angina, Valve disease, Heart Failure, Cardiac Arrhythmia, Pacemakers, and the ICD.   Cardiac Procedures: - Group verbal and written instruction and models to describe the testing methods done to diagnose heart disease. Reviews the outcomes of the test results. Describes the treatment choices: Medical Management, Angioplasty, or Coronary Bypass Surgery.   Cardiac Medications: - Group verbal and written instruction to review commonly prescribed medications for heart disease. Reviews the medication, class of the drug, and side effects. Includes the steps to properly store meds and maintain the prescription regimen.   Go Sex-Intimacy & Heart Disease, Get SMART - Goal Setting: - Group verbal and written instruction through game format to discuss heart disease and the return to sexual intimacy. Provides group verbal and written material to discuss and apply goal setting through the application of the S.M.A.R.T. Method.   Other Matters of the Heart: - Provides group verbal, written materials and models to describe Heart Failure, Angina, Valve Disease,  and Diabetes in the realm of heart disease. Includes description of the disease process and treatment options available to the cardiac patient.   Exercise & Equipment Safety: - Individual verbal instruction and demonstration of equipment use and safety with use of the equipment.      Cardiac Rehab from 10/14/2014 in [facility]   Date  08/24/14   Educator  SB   Instruction Review Code  2- meets goals/outcomes      Infection Prevention: - Provides verbal and written material to individual with discussion of infection control including proper hand washing and proper equipment cleaning during exercise session.      Cardiac Rehab from 10/14/2014 in [facility]   Date  08/24/14   Educator  SB   Instruction Review Code  2- meets goals/outcomes      Falls Prevention: - Provides verbal and written material to individual with discussion of falls prevention and safety.      Cardiac Rehab from 10/14/2014 in [facility]   Date  08/24/14   Educator  SB   Instruction Review Code  2- meets goals/outcomes      Diabetes: - Individual verbal and written instruction to review signs/symptoms of diabetes, desired ranges of glucose level fasting, after meals and with exercise. Advice that pre and post exercise glucose checks will be done for 3 sessions at entry of program.      Cardiac Rehab from 10/14/2014 in [facility]   Date  08/24/14   Educator  SB   Instruction Review Code  2- meets goals/outcomes       Knowledge Questionnaire Score:     Knowledge Questionnaire Score - 08/25/14 0713    Knowledge Questionnaire Score     Pre Score 26/28      Personal Goals and Risk Factors at Admission:     Personal Goals and Risk Factors at Admission - 08/24/14 1348    Personal Goals and Risk Factors on Admission   Increase Aerobic Exercise and Physical Activity Yes   Intervention While in program, learn and follow the exercise prescription taught. Start at a low level workload  and increase workload after able to maintain previous level for 30 minutes. Increase time before increasing intensity.   Diabetes Yes   Goal Blood glucose control identified by blood glucose values, HgbA1C. Participant verbalizes understanding of the signs/symptoms of hyper/hypo glycemia, proper foot care and importance of medication and nutrition plan for blood glucose control.   Intervention Provide nutrition & aerobic exercise along with prescribed medications to achieve blood glucose in normal ranges: Fasting 65-99 mg/dL   Hypertension Yes   Goal Participant will see blood pressure controlled within the values of 140/90mm/Hg or within value directed by their physician.   Intervention Provide nutrition & aerobic exercise along with prescribed medications to achieve BP 140/90 or less.   Lipids Yes   Goal Cholesterol controlled with medications as prescribed, with individualized exercise RX and with personalized nutrition plan. Value goals: LDL < 70mg, HDL > 40mg. Participant states understanding of desired cholesterol values and following prescriptions.   Intervention Provide nutrition & aerobic exercise along with prescribed medications to achieve LDL <70mg, HDL >40mg.      Personal Goals and Risk Factors Review:      Goals and Risk Factor Review      08/26/14 1202 09/11/14 1207 10/14/14 1143       Weight Management   Goals Progress/Improvement seen  Yes Yes     Comments  Doing well with weight managment. A few pounds down since admission.  Is down 3 lbs since starting program. Wants to get down to 200.     Increase Aerobic Exercise and Physical Activity   Goals Progress/Improvement seen  No No Yes     Comments Goals Comments: Had intermittent chest discomfort "not like my heart attack" after approx 2 minutes of exercising on the treadmill. 2.2mph  No incline. I called Dr. Gollan's office but they were unable to see him so recommended ER which I passed that info along. I called the  [place] office where patient is usually seen and they recommended Emerg Dept. Also. With rest Adonijah did not have any further chest pain. I left a vm for his daughter also after I asked his permission to call her.  COntinues to have angina symptoms, especially when on the treadmill. Has appointment this afternoon with doctor. WIll talk to his doctor about his recurrent symptoms.  Is able to do more exercise and also exercises 3d/wk that he doesn't come to class. His chest pain is still limiting him a lot and his doctor told him to exercise up to the angina threshold. He is still very frustrated with this.      Diabetes   Goal  Blood glucose control identified by blood glucose values, HgbA1C. Participant verbalizes understanding of the signs/symptoms of hyper/hypo glycemia, proper foot care and importance of medication and nutrition plan for blood glucose control.      Progress seen towards goals  Yes      Comments  States blood sugar levels are controlled      Hypertension   Goal  Participant will see blood pressure controlled within the values of 140/90mm/Hg or within value directed   by their physician.      Progress seen toward goals  Yes      Comments  BP in good range      Abnormal Lipids   Goal  Cholesterol controlled with medications as prescribed, with individualized exercise RX and with personalized nutrition plan. Value goals: LDL < 70mg, HDL > 40mg. Participant states understanding of desired cholesterol values and following prescriptions.      Progress seen towards goals  Unknown      Comments  no lab values to compare         Personal Goals Discharge:     Comments: 30 day review Continue with current ITP.        

## 2014-10-21 NOTE — Progress Notes (Signed)
Daily Session Note  Patient Details  Name: Joseph [name] MRN: [id number] Date of Birth: 1945/09/14 Referring Provider:  Leonie Man, MD  Encounter Date: 10/21/2014  Check In:     Session Check In - 10/21/14 0908    Check-In   Staff Present Lestine Box BS, ACSM EP-C, Exercise Physiologist;Susanne Bice RN, BSN, CCRP;Renee Dillard [place] MS, ACSM CEP Exercise Physiologist   ER physicians immediately available to respond to emergencies See telemetry face sheet for immediately available ER MD   Medication changes reported     No   Fall or balance concerns reported    No   Warm-up and Cool-down Performed on first and last piece of equipment   VAD Patient? No   Pain Assessment   Currently in Pain? No/denies         Goals Met:  Proper associated with RPD/PD & O2 Sat Exercise tolerated well No report of cardiac concerns or symptoms Strength training completed today  Goals Unmet:  Not Applicable  Goals Comments:    Dr. Emily Filbert is Medical Director for [facility].

## 2014-10-23 ENCOUNTER — Encounter: Payer: Medicare Other | Admitting: *Deleted

## 2014-10-23 DIAGNOSIS — I214 Non-ST elevation (NSTEMI) myocardial infarction: Secondary | ICD-10-CM | POA: Diagnosis not present

## 2014-10-23 NOTE — Progress Notes (Signed)
Daily Session Note  Patient Details  Name: Joseph [name] MRN: [id number] Date of Birth: [birth year] Referring Mckennah Kretchmer:  Leonie Man, MD  Encounter Date: 10/23/2014  Check In:     Session Check In - 10/23/14 0949    Check-In   Staff Present Frederich Cha RRT, RCP Respiratory Therapist;Carroll Enterkin RN, Drusilla Kanner MS, ACSM CEP Exercise Physiologist   ER physicians immediately available to respond to emergencies See telemetry face sheet for immediately available ER MD   Medication changes reported     No   Fall or balance concerns reported    No   Warm-up and Cool-down Performed on first and last piece of equipment   VAD Patient? No   Pain Assessment   Currently in Pain? No/denies   Multiple Pain Sites No         Goals Met:  Independence with exercise equipment Exercise tolerated well No report of cardiac concerns or symptoms Strength training completed today  Goals Unmet:  Not Applicable  Goals Comments: [name] is now exercising on 5 days/week and doing very well.    Dr. Emily Filbert is Medical Director for [facility] and [facility].

## 2014-10-26 ENCOUNTER — Encounter: Payer: Medicare Other | Admitting: *Deleted

## 2014-10-26 DIAGNOSIS — I214 Non-ST elevation (NSTEMI) myocardial infarction: Secondary | ICD-10-CM | POA: Diagnosis not present

## 2014-10-26 DIAGNOSIS — Z9861 Coronary angioplasty status: Secondary | ICD-10-CM

## 2014-10-26 NOTE — Progress Notes (Signed)
Daily Session Note  Patient Details  Name: Joseph [name] MRN: [id number] Date of Birth: Nov 19, 1945 Referring Provider:  Leonie Man, MD  Encounter Date: 10/26/2014  Check In:     Session Check In - 10/26/14 0850    Check-In   Staff Present Candiss Norse MS, ACSM CEP Exercise Physiologist;Susanne Bice RN, BSN, CCRP;Kelly Alfonso Patten, ACSM CEP Exercise Physiologist   ER physicians immediately available to respond to emergencies See telemetry face sheet for immediately available ER MD   Medication changes reported     No   Fall or balance concerns reported    No   Warm-up and Cool-down Performed on first and last piece of equipment   VAD Patient? No   Pain Assessment   Currently in Pain? No/denies   Multiple Pain Sites No         Goals Met:  Independence with exercise equipment Exercise tolerated well No report of cardiac concerns or symptoms Strength training completed today  Goals Unmet:  Not Applicable  Goals Comments: Did well with all exercise goals today.    Dr. Emily Filbert is Medical Director for [facility].

## 2014-10-28 ENCOUNTER — Encounter: Payer: Medicare Other | Admitting: *Deleted

## 2014-10-28 ENCOUNTER — Encounter: Payer: Self-pay | Admitting: *Deleted

## 2014-10-28 DIAGNOSIS — I214 Non-ST elevation (NSTEMI) myocardial infarction: Secondary | ICD-10-CM

## 2014-10-28 DIAGNOSIS — Z9861 Coronary angioplasty status: Secondary | ICD-10-CM

## 2014-10-28 NOTE — Progress Notes (Signed)
Daily Session Note  Patient Details  Name: Joseph [name] MRN: [id number] Date of Birth: 01-29-1946 Referring Provider:  Leonie Man, MD  Encounter Date: 10/28/2014  Check In:     Session Check In - 10/28/14 0837    Check-In   Staff Present Candiss Norse MS, ACSM CEP Exercise Physiologist;Susanne Bice RN, BSN, CCRP;Steven Way BS, ACSM EP-C, Exercise Physiologist   ER physicians immediately available to respond to emergencies See telemetry face sheet for immediately available ER MD   Medication changes reported     No   Fall or balance concerns reported    No   Warm-up and Cool-down Performed on first and last piece of equipment   VAD Patient? No   Pain Assessment   Currently in Pain? No/denies   Multiple Pain Sites No         Goals Met:  Independence with exercise equipment Exercise tolerated well Strength training completed today  Goals Unmet:  Not Applicable  Goals Comments: Completed all exercise goals today and was able to get over an hour of exercise in on the Nu[employer]. [name]'s aerobic endurance is increasing.    Dr. Emily Filbert is Medical Director for [facility] and [facility].

## 2014-10-28 NOTE — Progress Notes (Signed)
Daily Session Note  Patient Details  Name: Joseph [name] MRN: [id number] Date of Birth: 09-27-1945 Referring Provider:  Leonie Man, MD  Encounter Date: 10/28/2014  Check In:     Session Check In - 10/28/14 0837    Check-In   Staff Present Candiss Norse MS, ACSM CEP Exercise Physiologist;Susanne Bice RN, BSN, CCRP;Steven Way BS, ACSM EP-C, Exercise Physiologist   ER physicians immediately available to respond to emergencies See telemetry face sheet for immediately available ER MD   Medication changes reported     No   Fall or balance concerns reported    No   Warm-up and Cool-down Performed on first and last piece of equipment   VAD Patient? No   Pain Assessment   Currently in Pain? No/denies   Multiple Pain Sites No         Goals Met:  Independence with exercise equipment Exercise tolerated well No report of cardiac concerns or symptoms  Goals Unmet:  Not Applicable  Goals Comments: Doing well with exercise prescription progression.    Dr. Emily Filbert is Medical Director for [facility] and [facility].

## 2014-11-02 ENCOUNTER — Encounter: Payer: Medicare Other | Admitting: *Deleted

## 2014-11-02 DIAGNOSIS — Z9861 Coronary angioplasty status: Secondary | ICD-10-CM

## 2014-11-02 DIAGNOSIS — I214 Non-ST elevation (NSTEMI) myocardial infarction: Secondary | ICD-10-CM | POA: Diagnosis not present

## 2014-11-02 NOTE — Progress Notes (Signed)
Daily Session Note  Patient Details  Name: Joseph [name] MRN: [id number] Date of Birth: 07/25/1945 Referring Provider:  Harding, David W, MD  Encounter Date: 11/02/2014  Check In:     Session Check In - 11/02/14 0841    Check-In   Staff Present  Hayes BS, ACSM CEP Exercise Physiologist;Renee MacMillan MS, ACSM CEP Exercise Physiologist;Susanne Bice RN, BSN, CCRP   ER physicians immediately available to respond to emergencies See telemetry face sheet for immediately available ER MD   Medication changes reported     No   Fall or balance concerns reported    No   Warm-up and Cool-down Performed on first and last piece of equipment   VAD Patient? No   Pain Assessment   Currently in Pain? No/denies   Multiple Pain Sites No         Goals Met:  Independence with exercise equipment Exercise tolerated well No report of cardiac concerns or symptoms Strength training completed today  Goals Unmet:  Not Applicable  Goals Comments:    Dr. Mark Miller is Medical Director for [facility] and [facility]. 

## 2014-11-03 ENCOUNTER — Other Ambulatory Visit (HOSPITAL_COMMUNITY): Payer: Self-pay

## 2014-11-03 ENCOUNTER — Inpatient Hospital Stay (HOSPITAL_COMMUNITY)
Admission: EM | Admit: 2014-11-03 | Discharge: 2014-11-08 | DRG: 280 | Disposition: A | Discharge status: Home / Self Care | Payer: Medicare Other | Attending: Cardiology | Admitting: Cardiology

## 2014-11-03 ENCOUNTER — Other Ambulatory Visit: Payer: Self-pay

## 2014-11-03 ENCOUNTER — Encounter (HOSPITAL_COMMUNITY): Payer: Self-pay | Admitting: Emergency Medicine

## 2014-11-03 ENCOUNTER — Emergency Department (HOSPITAL_COMMUNITY): Payer: Medicare Other

## 2014-11-03 DIAGNOSIS — Z955 Presence of coronary angioplasty implant and graft: Secondary | ICD-10-CM | POA: Diagnosis not present

## 2014-11-03 DIAGNOSIS — Z8546 Personal history of malignant neoplasm of prostate: Secondary | ICD-10-CM

## 2014-11-03 DIAGNOSIS — N179 Acute kidney failure, unspecified: Secondary | ICD-10-CM | POA: Diagnosis present

## 2014-11-03 DIAGNOSIS — Z87891 Personal history of nicotine dependence: Secondary | ICD-10-CM | POA: Diagnosis not present

## 2014-11-03 DIAGNOSIS — D649 Anemia, unspecified: Secondary | ICD-10-CM | POA: Diagnosis present

## 2014-11-03 DIAGNOSIS — Z794 Long term (current) use of insulin: Secondary | ICD-10-CM | POA: Diagnosis not present

## 2014-11-03 DIAGNOSIS — I249 Acute ischemic heart disease, unspecified: Secondary | ICD-10-CM | POA: Diagnosis not present

## 2014-11-03 DIAGNOSIS — I5043 Acute on chronic combined systolic (congestive) and diastolic (congestive) heart failure: Secondary | ICD-10-CM | POA: Diagnosis present

## 2014-11-03 DIAGNOSIS — N184 Chronic kidney disease, stage 4 (severe): Secondary | ICD-10-CM | POA: Diagnosis present

## 2014-11-03 DIAGNOSIS — Z7902 Long term (current) use of antithrombotics/antiplatelets: Secondary | ICD-10-CM

## 2014-11-03 DIAGNOSIS — Z9861 Coronary angioplasty status: Secondary | ICD-10-CM

## 2014-11-03 DIAGNOSIS — D472 Monoclonal gammopathy: Secondary | ICD-10-CM | POA: Diagnosis present

## 2014-11-03 DIAGNOSIS — I5023 Acute on chronic systolic (congestive) heart failure: Secondary | ICD-10-CM | POA: Diagnosis not present

## 2014-11-03 DIAGNOSIS — I214 Non-ST elevation (NSTEMI) myocardial infarction: Principal | ICD-10-CM | POA: Diagnosis present

## 2014-11-03 DIAGNOSIS — I493 Ventricular premature depolarization: Secondary | ICD-10-CM | POA: Diagnosis present

## 2014-11-03 DIAGNOSIS — D631 Anemia in chronic kidney disease: Secondary | ICD-10-CM | POA: Diagnosis present

## 2014-11-03 DIAGNOSIS — D528 Other folate deficiency anemias: Secondary | ICD-10-CM | POA: Diagnosis not present

## 2014-11-03 DIAGNOSIS — I255 Ischemic cardiomyopathy: Secondary | ICD-10-CM | POA: Diagnosis present

## 2014-11-03 DIAGNOSIS — D696 Thrombocytopenia, unspecified: Secondary | ICD-10-CM | POA: Diagnosis present

## 2014-11-03 DIAGNOSIS — E1122 Type 2 diabetes mellitus with diabetic chronic kidney disease: Secondary | ICD-10-CM | POA: Diagnosis present

## 2014-11-03 DIAGNOSIS — Z7982 Long term (current) use of aspirin: Secondary | ICD-10-CM

## 2014-11-03 DIAGNOSIS — I129 Hypertensive chronic kidney disease with stage 1 through stage 4 chronic kidney disease, or unspecified chronic kidney disease: Secondary | ICD-10-CM | POA: Diagnosis present

## 2014-11-03 DIAGNOSIS — Z951 Presence of aortocoronary bypass graft: Secondary | ICD-10-CM | POA: Diagnosis not present

## 2014-11-03 DIAGNOSIS — I10 Essential (primary) hypertension: Secondary | ICD-10-CM | POA: Diagnosis present

## 2014-11-03 DIAGNOSIS — E876 Hypokalemia: Secondary | ICD-10-CM | POA: Diagnosis not present

## 2014-11-03 DIAGNOSIS — R079 Chest pain, unspecified: Secondary | ICD-10-CM | POA: Diagnosis present

## 2014-11-03 DIAGNOSIS — I248 Other forms of acute ischemic heart disease: Secondary | ICD-10-CM | POA: Diagnosis present

## 2014-11-03 DIAGNOSIS — I2510 Atherosclerotic heart disease of native coronary artery without angina pectoris: Secondary | ICD-10-CM

## 2014-11-03 DIAGNOSIS — E1129 Type 2 diabetes mellitus with other diabetic kidney complication: Secondary | ICD-10-CM | POA: Diagnosis present

## 2014-11-03 DIAGNOSIS — I5042 Chronic combined systolic (congestive) and diastolic (congestive) heart failure: Secondary | ICD-10-CM | POA: Diagnosis not present

## 2014-11-03 DIAGNOSIS — E872 Acidosis: Secondary | ICD-10-CM | POA: Diagnosis not present

## 2014-11-03 DIAGNOSIS — N189 Chronic kidney disease, unspecified: Secondary | ICD-10-CM

## 2014-11-03 DIAGNOSIS — I252 Old myocardial infarction: Secondary | ICD-10-CM | POA: Diagnosis not present

## 2014-11-03 DIAGNOSIS — I509 Heart failure, unspecified: Secondary | ICD-10-CM | POA: Diagnosis not present

## 2014-11-03 HISTORY — DX: Hepatomegaly with splenomegaly, not elsewhere classified: R16.2

## 2014-11-03 HISTORY — DX: Long term (current) use of insulin: Z79.4

## 2014-11-03 HISTORY — DX: Reserved for inherently not codable concepts without codable children: IMO0001

## 2014-11-03 HISTORY — DX: Type 2 diabetes mellitus without complications: E11.9

## 2014-11-03 HISTORY — DX: Other specified anxiety disorders: F41.8

## 2014-11-03 HISTORY — DX: Biliary acute pancreatitis without necrosis or infection: K85.10

## 2014-11-03 LAB — CBC WITH DIFFERENTIAL/PLATELET
Basophils Absolute: 0 10*3/uL (ref 0.0–0.1)
Basophils Relative: 0 %
Eosinophils Absolute: 0 10*3/uL (ref 0.0–0.7)
Eosinophils Relative: 1 %
HCT: 27.7 % — ABNORMAL LOW (ref 39.0–52.0)
Hemoglobin: 8.7 g/dL — ABNORMAL LOW (ref 13.0–17.0)
Lymphocytes Relative: 9 %
Lymphs Abs: 0.5 10*3/uL — ABNORMAL LOW (ref 0.7–4.0)
MCH: 27.2 pg (ref 26.0–34.0)
MCHC: 31.4 g/dL (ref 30.0–36.0)
MCV: 86.6 fL (ref 78.0–100.0)
Monocytes Absolute: 0.4 10*3/uL (ref 0.1–1.0)
Monocytes Relative: 8 %
Neutro Abs: 4.1 10*3/uL (ref 1.7–7.7)
Neutrophils Relative %: 82 %
Platelets: 69 10*3/uL — ABNORMAL LOW (ref 150–400)
RBC: 3.2 MIL/uL — ABNORMAL LOW (ref 4.22–5.81)
RDW: 16.3 % — ABNORMAL HIGH (ref 11.5–15.5)
WBC: 5.1 10*3/uL (ref 4.0–10.5)

## 2014-11-03 LAB — BASIC METABOLIC PANEL
Anion gap: 11 (ref 5–15)
BUN: 60 mg/dL — ABNORMAL HIGH (ref 6–20)
CO2: 23 mmol/L (ref 22–32)
Calcium: 7.9 mg/dL — ABNORMAL LOW (ref 8.9–10.3)
Chloride: 105 mmol/L (ref 101–111)
Creatinine, Ser: 4.73 mg/dL — ABNORMAL HIGH (ref 0.61–1.24)
GFR calc Af Amer: 14 mL/min — ABNORMAL LOW (ref >60)
GFR calc non Af Amer: 12 mL/min — ABNORMAL LOW (ref >60)
Glucose, Bld: 263 mg/dL — ABNORMAL HIGH (ref 65–99)
Potassium: 3.8 mmol/L (ref 3.5–5.1)
Sodium: 139 mmol/L (ref 135–145)

## 2014-11-03 LAB — TROPONIN I: Troponin I: 0.13 ng/mL — ABNORMAL HIGH (ref <0.031)

## 2014-11-03 MED ORDER — MORPHINE SULFATE (PF) 4 MG/ML IV SOLN
4.0000 mg | Freq: Once | INTRAVENOUS | Status: AC
  Administered 2014-11-03: 4 mg via INTRAVENOUS
  Filled 2014-11-03: qty 1

## 2014-11-03 MED ORDER — NITROGLYCERIN IN D5W 200-5 MCG/ML-% IV SOLN
0.0000 ug/min | Freq: Once | INTRAVENOUS | Status: AC
  Administered 2014-11-03: 20 ug/min via INTRAVENOUS
  Filled 2014-11-03: qty 250

## 2014-11-03 MED ORDER — ASPIRIN 81 MG PO CHEW
324.0000 mg | CHEWABLE_TABLET | Freq: Once | ORAL | Status: AC
  Administered 2014-11-03: 324 mg via ORAL
  Filled 2014-11-03: qty 4

## 2014-11-03 NOTE — ED Provider Notes (Signed)
CSN: [id number]     Arrival date & time 11/03/14  2126 History   First MD Initiated Contact with Patient 11/03/14 2204     Chief Complaint  Patient presents with  . Chest Pain    Patient is a 69 y.o. male presenting with chest pain.  Chest Pain Pain location:  L chest Pain quality: dull and pressure   Pain severity:  Moderate Onset quality:  Sudden Timing:  Constant Progression:  Unchanged Chronicity:  Recurrent Context: movement   Associated symptoms: diaphoresis and shortness of breath     Past Medical History  Diagnosis Date  . Hypertension   . Hypercholesterolemia   . Prostate cancer   . Enlarged liver   . Spleen enlarged   . Thrombocytopenia   . Myocardial infarction   . Anxiety   . Depression   . Shortness of breath   . Diabetes mellitus without complication     INSULIN DEPENDENT  . GERD (gastroesophageal reflux disease)   . History of shingles   . Ischemic cardiomyopathy     a. echo 08/06/2014: EF 25-30%, multiple WMA, mod DD, PASP 49 mm Hg   . CKD (chronic kidney disease), stage IV     a. previously on dialysis; b. followed by Dr. Abigail Butts   . Coronary artery disease, occlusive     a. s/p 4v CABG 2001(LIMA-LAD, SVG-D2, SVG-OM1, SVG-right PDA, SVG-right PL1); b. cath 07/30/2014 vein grafts all down, patent LIMA to LAD, significant LM and ost LCx dx; c. cath 08/07/2014 Synergy DES 2.5 mm x 18 mm to dist LM and ost LCx, rec lifelong DAPT  . Coronary artery disease involving coronary bypass graft 08/2014    all vein grafts occluded. Patent LIMA-LAD -- s/p PCI ot LM-Cx;   [name] Chronic combined systolic and diastolic CHF, NYHA class 2     a. echo 08/2013: EF 40-45%, impaired relaxation, mild MR, LA mildly dilated,     Past Surgical History  Procedure Laterality Date  . Cardiac surgery    . Shoulder arthroscopy    . Penile prosthesis implant    . Myocardial infarction    . Cardiac catheterization    . Coronary angioplasty    . Coronary artery bypass graft  2001  .  Colonoscopy    . Liver biopsy    . Av fistula placement Left 2015  . Cardiac catheterization N/A 07/30/2014    Procedure: Left Heart Cath and Cors/Grafts Angiography;  Surgeon: Dionisio David, MD;  Location: [facility];  Service: Cardiovascular;  Laterality: N/A;  . Cardiac catheterization N/A 08/07/2014    Procedure: Coronary Stent Intervention;  Surgeon: Lorretta Harp, MD;  Location: [facility];  Service: Cardiovascular;  Laterality: N/A;   Family History  Problem Relation Age of Onset  . Acute myelogenous leukemia Brother   . CAD Brother   . Diabetes Mellitus II Brother   . Heart disease Father 32    CABG   Social History  Substance Use Topics  . Smoking status: Former Smoker    Quit date: 02/07/2008  . Smokeless tobacco: Former Systems developer  . Alcohol Use: No    Review of Systems  Constitutional: Positive for diaphoresis.  Respiratory: Positive for chest tightness and shortness of breath.   Cardiovascular: Positive for chest pain. Negative for leg swelling.  Genitourinary: Negative for decreased urine volume.  All other systems reviewed and are negative.  Allergies  Sulfa antibiotics  Home Medications   Prior to Admission medications  Medication Sig Start Date End Date Taking? Authorizing Provider  aspirin 81 MG tablet Take 81 mg by mouth daily.    Historical Provider, MD  BEE POLLEN PO Take by mouth daily. 1 tsp    Historical Provider, MD  calcitRIOL (ROCALTROL) 0.25 MCG capsule Take 0.25 mcg by mouth daily.     Historical Provider, MD  carvedilol (COREG) 12.5 MG tablet Take 0.5 tablets (6.25 mg total) by mouth 2 (two) times daily with a meal. Patient taking differently: Take 12.5 mg by mouth 2 (two) times daily with a meal.  08/08/14   Almyra Deforest, PA  clopidogrel (PLAVIX) 75 MG tablet Take 1 tablet (75 mg total) by mouth daily. 08/18/14   Liliane Shi, PA-C  ferrous sulfate 325 (65 FE) MG EC tablet Take 325 mg by mouth daily with breakfast.     Historical  Provider, MD  hydrALAZINE (APRESOLINE) 100 MG tablet Take 0.5 tablets (50 mg total) by mouth 3 (three) times daily. 10/14/14   Leonie Man, MD  isosorbide mononitrate (IMDUR) 60 MG 24 hr tablet Take 1.5 tablets (90 mg total) by mouth daily. Patient taking differently: Take by mouth. Takes 60 mg am and 30 mg pm daily. 09/11/14   Ryan M Dunn, PA-C  LANTUS SOLOSTAR 100 UNIT/ML Solostar Pen Inject 20 Units into the skin at bedtime.     Historical Provider, MD  LYRICA 75 MG capsule Take 75 mg by mouth 2 (two) times daily.     Historical Provider, MD  nitroGLYCERIN (NITROLINGUAL) 0.4 MG/SPRAY spray Place 1 spray under the tongue every 5 (five) minutes x 3 doses as needed for chest pain. Do not use together with sublingual nitro 08/08/14   Almyra Deforest, PA  NITROSTAT 0.4 MG SL tablet place 1 tablet under the tongue if needed every 5 minutes for che...  (REFER TO PRESCRIPTION NOTES). 08/08/14   Historical Provider, MD  Omega-3 Fatty Acids (FISH OIL) 1200 MG CAPS Take 1 capsule by mouth daily.    Historical Provider, MD  ranolazine (RANEXA) 1000 MG SR tablet Take 1 tablet (1,000 mg total) by mouth 2 (two) times daily. 09/11/14   Areta Haber Dunn, PA-C  rosuvastatin (CRESTOR) 20 MG tablet Take 20 mg by mouth daily.    Historical Provider, MD  sodium bicarbonate 650 MG tablet Take 650 mg by mouth 2 (two) times daily.     Historical Provider, MD  sodium polystyrene (KAYEXALATE) 15 GM/60ML suspension Take 15 g by mouth 3 (three) times a week. Mon / Wed / Fri    Historical Provider, MD  Spirulina 500 MG TABS Take 1 tablet by mouth daily.    Historical Provider, MD  torsemide (DEMADEX) 20 MG tablet Take 1 tablet (20 mg total) by mouth daily. Patient taking differently: Take 10 mg by mouth daily.  08/27/14   Luke K Kilroy, PA-C   BP 178/82 mmHg  Pulse 84  Temp(Src) 98.1 F (36.7 C) (Oral)  Resp 18  SpO2 96% Physical Exam  Constitutional: He is oriented to person, place, and time. He appears well-developed and  well-nourished. He appears distressed.  Patient in discomfort  HENT:  Head: Normocephalic.  Eyes: Pupils are equal, round, and reactive to light.  Neck: Normal range of motion.  Cardiovascular: Normal rate.   Murmur heard. Pulmonary/Chest: Effort normal. He has no wheezes.  Decreased breath sounds bilaterally in bases  Abdominal: Soft. He exhibits no distension. There is no tenderness. There is no rebound.  Musculoskeletal: Normal range of motion.  Trace bilateral edema  Neurological: He is alert and oriented to person, place, and time.  Skin: Skin is warm and dry.  Nursing note and vitals reviewed.   ED Course  Procedures (including critical care time) Labs Review Labs Reviewed  CBC WITH DIFFERENTIAL/PLATELET - Abnormal; Notable for the following:    RBC 3.20 (*)    Hemoglobin 8.7 (*)    HCT 27.7 (*)    RDW 16.3 (*)    Lymphs Abs 0.5 (*)    All other components within normal limits  BASIC METABOLIC PANEL  TROPONIN I    Imaging Review No results found. I have personally reviewed and evaluated these images and lab results as part of my medical decision-making.   EKG Interpretation   Date/Time:  Tuesday November 03 2014 21:32:22 EDT Ventricular Rate:  88 PR Interval:  214 QRS Duration: 134 QT Interval:  386 QTC Calculation: 467 R Axis:   39 Text Interpretation:  Sinus rhythm with 1st degree A-V block with  occasional Premature ventricular complexes Inferior infarct , age  undetermined more pronounced ischemic changes laterally as compared to  most recent Abnormal ECG Confirmed by Wilson Singer  MD, [place] [phone]) on  11/03/2014 9:47:03 PM      MDM   Mr. Mori is a 69 year old male past medical history of coronary artery disease who presents emergency department today with left-sided chest pain associated with shortness of breath and diaphoresis. Patient started having this pain while starting to walk his dog. He took nitroglycerin without relief and presents emergency  department for further evaluation. Upon his arrival he is in obvious discomfort had EKG changes with ST depression in his lateral leads and given aspirin and nitroglycerin. Initially he had no improvement in his chest pain until he is placed on nitro drip and given morphine.  Troponin is positive from previous labs.  Will defer anticoagulation due to low platelets. Patient admitted to cardiology for further management.   Final diagnoses:  Chest pain, unspecified chest pain type  CAD S/P PCI/DES July 2016  NSTEMI (non-ST elevated myocardial infarction)      Roberto Scales, MD 11/04/14 0028  Courteney Julio Alm, MD 11/05/14 9892

## 2014-11-03 NOTE — H&P (Signed)
Joseph [name] is an 69 y.o. male.   Chief Complaint: chest pain Primary cardiologist: Dr. Ellyn Hack HPI: Joseph [name] is a 69 yo man with PMH of CAD s/p CABG '01 at [facility] (LIMA-LAD, SVG-D2, SVG-OM1, SVG-right PDA, SVG-right PL1) along with previous PCI with ischemic cardiomyopathy and chronic systolic heart failure last known EF 25-30% by 6/16 echo, stage IV CKD (has had dialysis previously), chronic thrombocytopenia, T2DM, prostate cancer who was seen in clinic 10/14/14 by Dr. Ellyn Hack.   He had a cardiac catheterization by Dr. Humphrey Rolls in [facility] 07/30/14 revealing all vein grafts occluded, (known dating to '09), patent LIMA to LAD, 70% ostial left main lesion, 70% ostial LCx, occluded RCA. He underwent PCI by Dr. Gwenlyn Found 08/07/14 for critical left main and ostial LCx at 90% both and had synergy DES to both. he patient underwent cardiac catheterization with Dr. Gwenlyn Found 08/07/14. He was noted to have critical left main disease and ostial left circumflex disease, both 90% stenosed and treated with synergy DES. He had some chest pain at 7/20 cardiac rehab (palpitations, PACs, PAT and bardycardia but no VT on 48 hor holter) leading to observation and negative cardiac markers, elevated d-dimer and VQ performed low risk for PE and ranexa restarted. He had some chest pain during other cardiac rehab and had follow-up 8/5 with uptitration of hydralazine and ranolazine.   He presents tonight with left-sided chest pain with radiation to neck/jaw. He characterizes the pain as ache with some pressure. He is able to ride the bicycle at rehab without pain but walking around, up hill or getting on the treadmill brings on chest discomfort. He hasn't missed any doses of plavix or aspirin. He has noted swelling and dyspnea lying flat. In the ER his symptoms improved with IV NTG.    Past Medical History  Diagnosis Date  . Hypertension   . Hypercholesterolemia   . Prostate cancer   . Enlarged liver   . Spleen enlarged   .  Thrombocytopenia   . Myocardial infarction   . Anxiety   . Depression   . Shortness of breath   . Diabetes mellitus without complication     INSULIN DEPENDENT  . GERD (gastroesophageal reflux disease)   . History of shingles   . Ischemic cardiomyopathy     a. echo 08/06/2014: EF 25-30%, multiple WMA, mod DD, PASP 49 mm Hg   . CKD (chronic kidney disease), stage IV     a. previously on dialysis; b. followed by Dr. Abigail Butts   . Coronary artery disease, occlusive     a. s/p 4v CABG 2001(LIMA-LAD, SVG-D2, SVG-OM1, SVG-right PDA, SVG-right PL1); b. cath 07/30/2014 vein grafts all down, patent LIMA to LAD, significant LM and ost LCx dx; c. cath 08/07/2014 Synergy DES 2.5 mm x 18 mm to dist LM and ost LCx, rec lifelong DAPT  . Coronary artery disease involving coronary bypass graft 08/2014    all vein grafts occluded. Patent LIMA-LAD -- s/p PCI ot LM-Cx;   [name] Chronic combined systolic and diastolic CHF, NYHA class 2     a. echo 08/2013: EF 40-45%, impaired relaxation, mild MR, LA mildly dilated,      Past Surgical History  Procedure Laterality Date  . Cardiac surgery    . Shoulder arthroscopy    . Penile prosthesis implant    . Myocardial infarction    . Cardiac catheterization    . Coronary angioplasty    . Coronary artery bypass graft  2001  . Colonoscopy    .  Liver biopsy    . Av fistula placement Left 2015  . Cardiac catheterization N/A 07/30/2014    Procedure: Left Heart Cath and Cors/Grafts Angiography;  Surgeon: Dionisio David, MD;  Location: [facility] [place] [facility];  Service: Cardiovascular;  Laterality: N/A;  . Cardiac catheterization N/A 08/07/2014    Procedure: Coronary Stent Intervention;  Surgeon: Lorretta Harp, MD;  Location: [place] [facility];  Service: Cardiovascular;  Laterality: N/A;    Family History  Problem Relation Age of Onset  . Acute myelogenous leukemia Brother   . CAD Brother   . Diabetes Mellitus II Brother   . Heart disease Father 45    CABG   Social  History:  reports that he quit smoking about 6 years ago. He has quit using smokeless tobacco. He reports that he does not drink alcohol or use illicit drugs.  Allergies:  Allergies  Allergen Reactions  . Sulfa Antibiotics Itching    hives  . Morphine And Related     Makes patient feel weird     (Not in a hospital admission)  Results for orders placed or performed during the hospital encounter of 11/03/14 (from the past 48 hour(s))  CBC with Differential     Status: Abnormal   Collection Time: 11/03/14  9:45 PM  Result Value Ref Range   WBC 5.1 4.0 - 10.5 K/uL   RBC 3.20 (L) 4.22 - 5.81 MIL/uL   Hemoglobin 8.7 (L) 13.0 - 17.0 g/dL   HCT 27.7 (L) 39.0 - 52.0 %   MCV 86.6 78.0 - 100.0 fL   MCH 27.2 26.0 - 34.0 pg   MCHC 31.4 30.0 - 36.0 g/dL   RDW 16.3 (H) 11.5 - 15.5 %   Platelets 69 (L) 150 - 400 K/uL    Comment: PLATELET COUNT CONFIRMED BY SMEAR SPECIMEN CHECKED FOR CLOTS    Neutrophils Relative % 82 %   Neutro Abs 4.1 1.7 - 7.7 K/uL   Lymphocytes Relative 9 %   Lymphs Abs 0.5 (L) 0.7 - 4.0 K/uL   Monocytes Relative 8 %   Monocytes Absolute 0.4 0.1 - 1.0 K/uL   Eosinophils Relative 1 %   Eosinophils Absolute 0.0 0.0 - 0.7 K/uL   Basophils Relative 0 %   Basophils Absolute 0.0 0.0 - 0.1 K/uL  Basic metabolic panel     Status: Abnormal   Collection Time: 11/03/14  9:45 PM  Result Value Ref Range   Sodium 139 135 - 145 mmol/L   Potassium 3.8 3.5 - 5.1 mmol/L   Chloride 105 101 - 111 mmol/L   CO2 23 22 - 32 mmol/L   Glucose, Bld 263 (H) 65 - 99 mg/dL   BUN 60 (H) 6 - 20 mg/dL   Creatinine, Ser 4.73 (H) 0.61 - 1.24 mg/dL   Calcium 7.9 (L) 8.9 - 10.3 mg/dL   GFR calc non Af Amer 12 (L) >60 mL/min   GFR calc Af Amer 14 (L) >60 mL/min    Comment: (NOTE) The eGFR has been calculated using the CKD EPI equation. This calculation has not been validated in all clinical situations. eGFR's persistently <60 mL/min signify possible Chronic Kidney Disease.    Anion gap 11 5  - 15  Troponin I     Status: Abnormal   Collection Time: 11/03/14  9:45 PM  Result Value Ref Range   Troponin I 0.13 (H) <0.031 ng/mL    Comment:        PERSISTENTLY INCREASED TROPONIN VALUES IN  THE RANGE OF 0.04-0.49 ng/mL CAN BE SEEN IN:       -UNSTABLE ANGINA       -CONGESTIVE HEART FAILURE       -MYOCARDITIS       -CHEST TRAUMA       -ARRYHTHMIAS       -LATE PRESENTING MYOCARDIAL INFARCTION       -COPD   CLINICAL FOLLOW-UP RECOMMENDED.    Dg Chest Portable 1 View  11/03/2014   CLINICAL DATA:  Chest pain and shortness of breath.  EXAM: PORTABLE CHEST 1 VIEW  COMPARISON:  Frontal and lateral views 08/2014  FINDINGS: There is assessed median sternotomy. Cardiomegaly is again seen, question increased versus secondary to portable technique. There is perivascular haziness, particularly in the lower lobes, concerning for pulmonary edema. No confluent airspace disease to suggest pneumonia. No large pleural effusion or pneumothorax.  IMPRESSION: Suspect pulmonary edema. Cardiomegaly, may be increased from prior versus accentuated by portable technique.   Electronically Signed   By: Jeb Levering M.D.   On: 11/03/2014 22:07    Review of Systems  Constitutional: Negative for fever, chills, weight loss and malaise/fatigue.  HENT: Negative for tinnitus.   Eyes: Negative for double vision, photophobia and pain.  Respiratory: Positive for shortness of breath. Negative for cough, hemoptysis and sputum production.   Cardiovascular: Positive for chest pain and leg swelling. Negative for palpitations and orthopnea.  Gastrointestinal: Negative for nausea, vomiting and diarrhea.  Genitourinary: Negative for dysuria and urgency.  Musculoskeletal: Negative for back pain and neck pain.  Skin: Negative for rash.  Neurological: Negative for dizziness, tingling, tremors and headaches.  Endo/Heme/Allergies: Negative for polydipsia.  Psychiatric/Behavioral: Negative for depression, suicidal ideas and  substance abuse.    Blood pressure 150/80, pulse 71, temperature 98.1 F (36.7 C), temperature source Oral, resp. rate 10, SpO2 97 %. Physical Exam  Nursing note and vitals reviewed. Constitutional: He is oriented to person, place, and time. He appears well-developed and well-nourished. No distress.  HENT:  Head: Normocephalic.  Nose: Nose normal.  Mouth/Throat: Oropharynx is clear and moist. No oropharyngeal exudate.  Eyes: Conjunctivae and EOM are normal. Pupils are equal, round, and reactive to light. No scleral icterus.  Neck: Normal range of motion. Neck supple. JVD present. No tracheal deviation present.  Cardiovascular: Normal rate, regular rhythm and intact distal pulses.  Exam reveals no gallop and no friction rub.   Murmur heard. Systolic murmur at LSB  Respiratory: Effort normal. No respiratory distress. He has no wheezes. He has rales.  GI: Soft. Bowel sounds are normal. He exhibits no distension. There is no tenderness. There is no rebound.  Musculoskeletal: Normal range of motion. He exhibits edema.  2+ woody edema bilaterally  Neurological: He is alert and oriented to person, place, and time. No cranial nerve deficit. Coordination normal.  Skin: Skin is warm and dry. No rash noted. He is not diaphoretic. No erythema.  Psychiatric: He has a normal mood and affect. His behavior is normal. Judgment and thought content normal.    Labs reviewed; K 3.8, na 139, bun/cr 60/4.73, glucose 263, trop 0.13, h/h 8.7/27.7, plt 69, wbc 5.1 Chest x-ray: increased vascularity ~ pulmonary edema? Ekg: SR, PVCs, inferior infarct (old), more ST depression (some repolarization) but notable lateral ST depression 6/16: echo EF25-30%, multiple WMA, dLA, grade II DD  Assessment/Plan Joseph [name] is a 69 yo man with PMH of CAD s/p CABG '01 at [facility]  (LIMA-LAD, SVG-D2, SVG-OM1, SVG-right PDA, SVG-right PL1) along with previous  PCI with ischemic cardiomyopathy and chronic systolic heart failure last  known EF 25-30% by 6/16 echo, stage IV CKD (has had dialysis previously), chronic thrombocytopenia, T2DM, prostate cancer who was seen in clinic 10/14/14 by Dr. Ellyn Hack and has been undergoing cardiac rehabilitation who presents with chest pain. On exam he has elevated JVP and chest x-ray with pulmonary edema. He also has worsening renal function. Differential diagnosis for elevated troponin includes demand ischemia from other stress event (sepsis), neurologic disease, acute renal failure, heart failure, among other etiologies. I favor a diagnosis of demand ischemia in setting of heart failure with elevated creatinine likely contributing. He could have ACS given significant history of CAD and recent PCI but he has heart failure on exam. Will follow-up BNP. Heparin gtt is not warranted given clinical picture more consistent with heart failure and in setting of anemia and thrombocytopenia. Trend troponins. IV lasix 80 mg first dose now. Discussed findings and plan with Joseph [name] and his daughter. He was to have follow-up echo to assess candidacy for CRT-D so will place this order.  Problem List Acute on chronic systolic and diastolic heart failure NSTEMI - likely type II related to HF Acute on chronic renal failure CAD with previous recent PCI and CABG Anemia Thrombocytopenia  T2DM Plan: - trend cardiac markers, stepdown, - observation on telemetry - 80 mg IV lasix bid, first dose now  - continue IV NTG gtt for now - asa 81 mg, continue home carvedilol (feels lukewarm on exam) - continue home plavix 75 mg, asa 81 mg, crestor, ranolazine 1000 bid (hldng home imdur 90 mg for IV NTG) - continue lyrica, lantus 20 units home dosing - no heparin gtt given thrombocytopenia and favor heart failure > ACS - update hba1c, tsh, lipid panel, BNP    Jylan Loeza 11/03/2014, 11:57 PM

## 2014-11-03 NOTE — ED Provider Notes (Signed)
9:47 PM Pt seen in triage after shown EKG. Significant cardiac hx. EKG with ischemic changes which appear changed from most recent. Pt appears uncomfortable. C/o CP with radiation to jaw. ASA and nitro ordered and w/u initiated. Heparin deferred with reported hx of thrombocytopenia. Discussed with charge nurse and to go to next available room.   Virgel Manifold, MD 11/03/14 2150

## 2014-11-03 NOTE — ED Notes (Signed)
Pt. reports left chest pain radiating to left jaw onset this evening unrelieved by 2 NTG sprays with SOB , denies nausea or diaphoresis , his cardiologist is Dr. Ellyn Hack , history of CAD / coronary stent placement .

## 2014-11-04 ENCOUNTER — Observation Stay (HOSPITAL_COMMUNITY): Payer: Medicare Other

## 2014-11-04 ENCOUNTER — Encounter (HOSPITAL_COMMUNITY): Payer: Self-pay | Admitting: Physician Assistant

## 2014-11-04 DIAGNOSIS — Z951 Presence of aortocoronary bypass graft: Secondary | ICD-10-CM

## 2014-11-04 DIAGNOSIS — I255 Ischemic cardiomyopathy: Secondary | ICD-10-CM

## 2014-11-04 DIAGNOSIS — N184 Chronic kidney disease, stage 4 (severe): Secondary | ICD-10-CM

## 2014-11-04 DIAGNOSIS — I5042 Chronic combined systolic (congestive) and diastolic (congestive) heart failure: Secondary | ICD-10-CM

## 2014-11-04 DIAGNOSIS — I2510 Atherosclerotic heart disease of native coronary artery without angina pectoris: Secondary | ICD-10-CM

## 2014-11-04 DIAGNOSIS — E1129 Type 2 diabetes mellitus with other diabetic kidney complication: Secondary | ICD-10-CM

## 2014-11-04 DIAGNOSIS — D631 Anemia in chronic kidney disease: Secondary | ICD-10-CM

## 2014-11-04 DIAGNOSIS — Z9861 Coronary angioplasty status: Secondary | ICD-10-CM

## 2014-11-04 DIAGNOSIS — I10 Essential (primary) hypertension: Secondary | ICD-10-CM

## 2014-11-04 DIAGNOSIS — N189 Chronic kidney disease, unspecified: Secondary | ICD-10-CM

## 2014-11-04 DIAGNOSIS — I214 Non-ST elevation (NSTEMI) myocardial infarction: Principal | ICD-10-CM

## 2014-11-04 DIAGNOSIS — I509 Heart failure, unspecified: Secondary | ICD-10-CM

## 2014-11-04 DIAGNOSIS — I249 Acute ischemic heart disease, unspecified: Secondary | ICD-10-CM

## 2014-11-04 LAB — GLUCOSE, CAPILLARY
GLUCOSE-CAPILLARY: 148 mg/dL — AB (ref 65–99)
GLUCOSE-CAPILLARY: 131 mg/dL — AB (ref 65–99)
GLUCOSE-CAPILLARY: 205 mg/dL — AB (ref 65–99)
Glucose-Capillary: 184 mg/dL — ABNORMAL HIGH (ref 65–99)

## 2014-11-04 LAB — CBC WITH DIFFERENTIAL/PLATELET
BASOS ABS: 0 10*3/uL (ref 0.0–0.1)
BASOS PCT: 0 %
EOS ABS: 0 10*3/uL (ref 0.0–0.7)
EOS PCT: 1 %
HCT: 27.3 % — ABNORMAL LOW (ref 39.0–52.0)
Hemoglobin: 8.8 g/dL — ABNORMAL LOW (ref 13.0–17.0)
LYMPHS PCT: 11 %
Lymphs Abs: 0.5 10*3/uL — ABNORMAL LOW (ref 0.7–4.0)
MCH: 27.4 pg (ref 26.0–34.0)
MCHC: 32.2 g/dL (ref 30.0–36.0)
MCV: 85 fL (ref 78.0–100.0)
MONO ABS: 0.4 10*3/uL (ref 0.1–1.0)
Monocytes Relative: 8 %
Neutro Abs: 3.6 10*3/uL (ref 1.7–7.7)
Neutrophils Relative %: 80 %
PLATELETS: 68 10*3/uL — AB (ref 150–400)
RBC: 3.21 MIL/uL — AB (ref 4.22–5.81)
RDW: 16.2 % — AB (ref 11.5–15.5)
WBC: 4.5 10*3/uL (ref 4.0–10.5)

## 2014-11-04 LAB — BASIC METABOLIC PANEL
ANION GAP: 10 (ref 5–15)
BUN: 62 mg/dL — ABNORMAL HIGH (ref 6–20)
CALCIUM: 7.9 mg/dL — AB (ref 8.9–10.3)
CO2: 22 mmol/L (ref 22–32)
Chloride: 108 mmol/L (ref 101–111)
Creatinine, Ser: 4.63 mg/dL — ABNORMAL HIGH (ref 0.61–1.24)
GFR, EST AFRICAN AMERICAN: 14 mL/min — AB (ref >60)
GFR, EST NON AFRICAN AMERICAN: 12 mL/min — AB (ref >60)
Glucose, Bld: 158 mg/dL — ABNORMAL HIGH (ref 65–99)
Potassium: 3.3 mmol/L — ABNORMAL LOW (ref 3.5–5.1)
SODIUM: 140 mmol/L (ref 135–145)

## 2014-11-04 LAB — LIPID PANEL
CHOLESTEROL: 113 mg/dL (ref 0–200)
HDL: 26 mg/dL — AB (ref >40)
LDL Cholesterol: 53 mg/dL (ref 0–99)
TRIGLYCERIDES: 168 mg/dL — AB (ref <150)
Total CHOL/HDL Ratio: 4.3 RATIO
VLDL: 34 mg/dL (ref 0–40)

## 2014-11-04 LAB — PROTIME-INR
INR: 1.29 (ref 0.00–1.49)
Prothrombin Time: 16.3 seconds — ABNORMAL HIGH (ref 11.6–15.2)

## 2014-11-04 LAB — CBC
HEMATOCRIT: 24.6 % — AB (ref 39.0–52.0)
HEMOGLOBIN: 7.8 g/dL — AB (ref 13.0–17.0)
MCH: 27.3 pg (ref 26.0–34.0)
MCHC: 31.7 g/dL (ref 30.0–36.0)
MCV: 86 fL (ref 78.0–100.0)
Platelets: 67 10*3/uL — ABNORMAL LOW (ref 150–400)
RBC: 2.86 MIL/uL — ABNORMAL LOW (ref 4.22–5.81)
RDW: 16.3 % — AB (ref 11.5–15.5)
WBC: 4.2 10*3/uL (ref 4.0–10.5)

## 2014-11-04 LAB — TROPONIN I
TROPONIN I: 6.92 ng/mL — AB (ref <0.031)
TROPONIN I: 5.89 ng/mL — AB (ref <0.031)
Troponin I: 2.01 ng/mL (ref <0.031)
Troponin I: 5.37 ng/mL (ref <0.031)

## 2014-11-04 LAB — PREPARE RBC (CROSSMATCH)

## 2014-11-04 LAB — TSH: TSH: 2.026 u[IU]/mL (ref 0.350–4.500)

## 2014-11-04 LAB — BRAIN NATRIURETIC PEPTIDE: B NATRIURETIC PEPTIDE 5: 2287.4 pg/mL — AB (ref 0.0–100.0)

## 2014-11-04 LAB — ABO/RH: ABO/RH(D): A POS

## 2014-11-04 LAB — MRSA PCR SCREENING: MRSA by PCR: NEGATIVE

## 2014-11-04 MED ORDER — SODIUM CHLORIDE 0.9 % IJ SOLN
3.0000 mL | Freq: Two times a day (BID) | INTRAMUSCULAR | Status: DC
  Administered 2014-11-05 – 2014-11-08 (×7): 3 mL via INTRAVENOUS

## 2014-11-04 MED ORDER — ROSUVASTATIN CALCIUM 10 MG PO TABS
20.0000 mg | ORAL_TABLET | Freq: Every day | ORAL | Status: DC
  Administered 2014-11-04 – 2014-11-08 (×5): 20 mg via ORAL
  Filled 2014-11-04 (×5): qty 2

## 2014-11-04 MED ORDER — INSULIN GLARGINE 100 UNIT/ML ~~LOC~~ SOLN
20.0000 [IU] | Freq: Every day | SUBCUTANEOUS | Status: DC
  Administered 2014-11-04 – 2014-11-07 (×4): 20 [IU] via SUBCUTANEOUS
  Filled 2014-11-04 (×6): qty 0.2

## 2014-11-04 MED ORDER — ONDANSETRON HCL 4 MG/2ML IJ SOLN: 4.0000 mg | Freq: Four times a day (QID) | INTRAMUSCULAR | Status: DC | PRN

## 2014-11-04 MED ORDER — PREGABALIN 50 MG PO CAPS
75.0000 mg | ORAL_CAPSULE | Freq: Two times a day (BID) | ORAL | Status: DC
  Administered 2014-11-04 – 2014-11-08 (×9): 75 mg via ORAL
  Filled 2014-11-04 (×18): qty 1

## 2014-11-04 MED ORDER — NITROGLYCERIN 0.4 MG SL SUBL: 0.4000 mg | SUBLINGUAL_TABLET | SUBLINGUAL | Status: DC | PRN

## 2014-11-04 MED ORDER — SODIUM CHLORIDE 0.9 % IV SOLN: 250.0000 mL | INTRAVENOUS | Status: DC | PRN

## 2014-11-04 MED ORDER — CLOPIDOGREL BISULFATE 75 MG PO TABS
75.0000 mg | ORAL_TABLET | Freq: Every day | ORAL | Status: DC
  Administered 2014-11-04 – 2014-11-08 (×5): 75 mg via ORAL
  Filled 2014-11-04 (×5): qty 1

## 2014-11-04 MED ORDER — SODIUM CHLORIDE 0.9 % IJ SOLN: 3.0000 mL | INTRAMUSCULAR | Status: DC | PRN

## 2014-11-04 MED ORDER — ACETAMINOPHEN 325 MG PO TABS: 650.0000 mg | ORAL_TABLET | ORAL | Status: DC | PRN

## 2014-11-04 MED ORDER — FUROSEMIDE 10 MG/ML IJ SOLN
80.0000 mg | Freq: Two times a day (BID) | INTRAMUSCULAR | Status: DC
  Administered 2014-11-04 – 2014-11-05 (×4): 80 mg via INTRAVENOUS
  Filled 2014-11-04 (×4): qty 8

## 2014-11-04 MED ORDER — CALCITRIOL 0.25 MCG PO CAPS
0.2500 ug | ORAL_CAPSULE | Freq: Every day | ORAL | Status: DC
  Administered 2014-11-04 – 2014-11-08 (×5): 0.25 ug via ORAL
  Filled 2014-11-04 (×5): qty 1

## 2014-11-04 MED ORDER — SODIUM POLYSTYRENE SULFONATE 15 GM/60ML PO SUSP: 15.0000 g | ORAL | Status: DC

## 2014-11-04 MED ORDER — ALPRAZOLAM 0.25 MG PO TABS
0.2500 mg | ORAL_TABLET | Freq: Once | ORAL | Status: AC
  Administered 2014-11-04: 0.25 mg via ORAL
  Filled 2014-11-04: qty 1

## 2014-11-04 MED ORDER — CARVEDILOL 6.25 MG PO TABS
6.2500 mg | ORAL_TABLET | Freq: Two times a day (BID) | ORAL | Status: DC
  Administered 2014-11-04 – 2014-11-08 (×9): 6.25 mg via ORAL
  Filled 2014-11-04 (×9): qty 1

## 2014-11-04 MED ORDER — ASPIRIN EC 81 MG PO TBEC
81.0000 mg | DELAYED_RELEASE_TABLET | Freq: Every day | ORAL | Status: DC
  Administered 2014-11-05 – 2014-11-08 (×4): 81 mg via ORAL
  Filled 2014-11-04 (×4): qty 1

## 2014-11-04 MED ORDER — ASPIRIN 81 MG PO TABS: 81.0000 mg | ORAL_TABLET | Freq: Every day | ORAL | Status: DC

## 2014-11-04 MED ORDER — RANOLAZINE ER 500 MG PO TB12
1000.0000 mg | ORAL_TABLET | Freq: Two times a day (BID) | ORAL | Status: DC
  Administered 2014-11-04 – 2014-11-08 (×9): 1000 mg via ORAL
  Filled 2014-11-04 (×9): qty 2

## 2014-11-04 MED ORDER — FUROSEMIDE 10 MG/ML IJ SOLN: 80.0000 mg | Freq: Once | INTRAMUSCULAR | Status: DC | PRN

## 2014-11-04 MED ORDER — HYDRALAZINE HCL 50 MG PO TABS
100.0000 mg | ORAL_TABLET | Freq: Two times a day (BID) | ORAL | Status: DC
  Administered 2014-11-05: 100 mg via ORAL
  Filled 2014-11-04: qty 2

## 2014-11-04 MED ORDER — SODIUM CHLORIDE 0.9 % IV SOLN: Freq: Once | INTRAVENOUS | Status: AC

## 2014-11-04 MED ORDER — INSULIN ASPART 100 UNIT/ML ~~LOC~~ SOLN
0.0000 [IU] | Freq: Three times a day (TID) | SUBCUTANEOUS | Status: DC
  Administered 2014-11-04: 4 [IU] via SUBCUTANEOUS
  Administered 2014-11-04 – 2014-11-05 (×2): 2 [IU] via SUBCUTANEOUS
  Administered 2014-11-05: 4 [IU] via SUBCUTANEOUS
  Administered 2014-11-06: 2 [IU] via SUBCUTANEOUS
  Administered 2014-11-06: 4 [IU] via SUBCUTANEOUS
  Administered 2014-11-07: 2 [IU] via SUBCUTANEOUS
  Administered 2014-11-07 – 2014-11-08 (×3): 4 [IU] via SUBCUTANEOUS

## 2014-11-04 MED ORDER — SODIUM BICARBONATE 650 MG PO TABS
650.0000 mg | ORAL_TABLET | Freq: Two times a day (BID) | ORAL | Status: DC
  Administered 2014-11-04 – 2014-11-05 (×3): 650 mg via ORAL
  Filled 2014-11-04 (×3): qty 1

## 2014-11-04 MED ORDER — NITROGLYCERIN IN D5W 200-5 MCG/ML-% IV SOLN
0.0000 ug/min | INTRAVENOUS | Status: DC
Start: 2014-11-04 — End: 2014-11-05
  Filled 2014-11-04: qty 250

## 2014-11-04 MED ORDER — FERROUS SULFATE 325 (65 FE) MG PO TABS
325.0000 mg | ORAL_TABLET | Freq: Every day | ORAL | Status: DC
Start: 2014-11-04 — End: 2014-11-08
  Administered 2014-11-04 – 2014-11-08 (×5): 325 mg via ORAL
  Filled 2014-11-04 (×5): qty 1

## 2014-11-04 NOTE — Progress Notes (Signed)
CRITICAL VALUE ALERT  Critical value received:  Troponin 2.01  Date of notification:  11/04/14  Time of notification:  0206  Critical value read back:Yes.    Nurse who received alert:  Ronette Deter  MD notified (1st page):  J.Kelly, MD  Time of first page:  0207  MD notified (2nd page):  Time of second page:  Responding MD:  Alejandro Mulling, MD  Time MD responded:  [id number]

## 2014-11-04 NOTE — Progress Notes (Signed)
Echocardiogram 2D Echocardiogram has been performed.  Tresa Res 11/04/2014, 1:15 PM

## 2014-11-04 NOTE — Progress Notes (Signed)
Subjective: No complaints  Objective: Vital signs in last 24 hours: Temp:  [97.6 F (36.4 C)-98.1 F (36.7 C)] 97.6 F (36.4 C) (09/28 1142) Pulse Rate:  [63-84] 63 (09/28 0405) Resp:  [10-46] 24 (09/28 0405) BP: (115-178)/(55-87) 115/55 mmHg (09/28 0405) SpO2:  [94 %-98 %] 94 % (09/28 0405) Weight:  [191 lb 12.8 oz (87 kg)-220 lb 4.8 oz (99.927 kg)] 220 lb 4.8 oz (99.927 kg) (09/28 0422) Last BM Date: 11/03/14  Intake/Output from previous day: 09/27 0701 - 09/28 0700 In: 480 [P.O.:480] Out: 1500 [Urine:1500] Intake/Output this shift: Total I/O In: -  Out: 900 [Urine:900]  Medications Scheduled Meds: . [START ON 11/05/2014] aspirin EC  81 mg Oral Daily  . calcitRIOL  0.25 mcg Oral Daily  . carvedilol  6.25 mg Oral BID WC  . clopidogrel  75 mg Oral Daily  . ferrous sulfate  325 mg Oral Q breakfast  . furosemide  80 mg Intravenous BID  . [START ON 11/05/2014] hydrALAZINE  100 mg Oral BID  . insulin aspart  0-24 Units Subcutaneous TID WC  . insulin glargine  20 Units Subcutaneous QHS  . pregabalin  75 mg Oral BID  . ranolazine  1,000 mg Oral BID  . rosuvastatin  20 mg Oral Daily  . sodium bicarbonate  650 mg Oral BID  . sodium chloride  3 mL Intravenous Q12H  . sodium polystyrene  15 g Oral Once per day on Mon Wed Fri   Continuous Infusions: . nitroGLYCERIN 25 mcg/min (11/04/14 0056)   PRN Meds:.sodium chloride, acetaminophen, furosemide, nitroGLYCERIN, ondansetron (ZOFRAN) IV, sodium chloride  PE: General appearance: alert, cooperative and no distress Lungs: clear to auscultation bilaterally Heart: regular rate and rhythm, S1, S2 normal, no murmur, click, rub or gallop Extremities: 1+ LEE Pulses: 2+ and symmetric Skin: Warm and dry Neurologic: Grossly normal  Lab Results:   Recent Labs  11/03/14 2145 11/04/14 0536  WBC 5.1 4.2  HGB 8.7* 7.8*  HCT 27.7* 24.6*  PLT 69* 67*   BMET  Recent Labs  11/03/14 2145 11/04/14 0536  NA 139 140  K 3.8  3.3*  CL 105 108  CO2 23 22  GLUCOSE 263* 158*  BUN 60* 62*  CREATININE 4.73* 4.63*  CALCIUM 7.9* 7.9*   PT/INR  Recent Labs  11/04/14 0536  LABPROT 16.3*  INR 1.29   Cholesterol  Recent Labs  11/04/14 0120  CHOL 113   Lipid Panel     Component Value Date/Time   CHOL 113 11/04/2014 0120   CHOL 97 04/25/2013 0532   TRIG 168* 11/04/2014 0120   TRIG 113 04/25/2013 0532   HDL 26* 11/04/2014 0120   HDL 33* 04/25/2013 0532   CHOLHDL 4.3 11/04/2014 0120   VLDL 34 11/04/2014 0120   VLDL 23 04/25/2013 0532   LDLCALC 53 11/04/2014 0120   LDLCALC 41 04/25/2013 0532   Cardiac Panel (last 3 results)  Recent Labs  11/03/14 2145 11/04/14 0120 11/04/14 0536  TROPONINI 0.13* 2.01* 5.37*     Assessment/Plan   Principal Problem:  NSTEMI (non-ST elevated myocardial infarction)   ACS (acute coronary syndrome) Troponin trending up.  Last is 5.37. This is likely more than demand ischemia and will need a LHC in the future.  On ASA and plavix.  Heparin was not started due to sudden drop in Hgb from 8.7 to 7.8 despite being diuresed for CHF. Echo pending read.  (was scheduled for OP Echo tomorrow @ [place] office)  Type 2 diabetes mellitus with renal manifestations  Continue lantus and SS.     Chronic renal disease, stage IV  Stable.  Continue to monitor.       Anemia, Acute on chronic Sudden drop in Hgb from 8.7 to 7.8 over night despite being diuresed 1.0 L.  We are checking stool guaiac and requested a GI consult.  With type and cross and transfuse one unit PRBCs.  80mg  IV post transfusion.    Acute on Chronic combined systolic and diastolic CHF, NYHA class 2   Net fluids:  -1.0L/-1.0L on 80mg  IV lasix BID.  Still volume overloaded but not severely.  Watch closely.    Essential hypertension  Well controlled at this time.     CAD S/P PCI/DES July 2016     LOS: 1 day    Joseph Fuller PA-C 11/04/2014 12:12 PM  I have seen, examined and evaluated the patient  this AM along with [name] on AM rounds.  After reviewing all the available data and chart,  I agree with his findings, examination as well as impression recommendations as per our discussion.  Tough situation - SSx are concerning for NSTEMI & + Troponin would correlate with this.  However, I am reluctant to anticoagulate given drop in Hgb. -- Cannot be sure of ACS vs. Demand Ischemia with  Anemia (acute on chronic) & Acute on Chronic Combined HF.    No cath until renal Fxn stabilizes - regardless cath is high risk with Cr 3-4.  Anything done would need to be staged (he only has LIMA & Native Cx to inject). Anemia is concerning given acute drop in Hgb -- with existing CAD & ICM, will transfuse x 1 unit.Joseph [name] stop ASA/Plavix given recent LM-Cx DES PCI (especially if we are concerned about potential ISR); GI evaluation for potential GIB.  - certainly chronic anemia related to CKD IV.  He is on a good dose of ARB & Hyrdralazine along with Crestor. Agree with IV diuretic - especially in light of RPBC transfusion. -- decrease LV wall stress.  For now, the plan is to stabilize CHF Sx & Anemia along with renal Fxn. Then we will need to consider when/if we do proceed with Cath.  Continue High dose Ranexa (did not tolerate Imdur in the past - he thinks)   Joseph [name], M.D., M.S. Interventional Cardiologist   Pager # [phone]

## 2014-11-04 NOTE — Consult Note (Signed)
                                                                           [facility] Consult: 12:45 PM 11/04/2014  LOS: 1 day    Referring Provider: Dr Harding  Primary Care Physician:  Eason,  Ernest B, MD in [place] Primary Gastroenterologist:  Unassigned.  Dr Rein Ocilla.  Hepatologist: Dr Steven Zacks at [facility].     Reason for Consultation:  Anemia.     HPI: Joseph [name] is a 68 y.o. male.  Hx IDDM. HLD.  MI/CAD; s/p 2001 CABG, s/p 08/2014 DES.  On low dose ASA and Plavix.  CHF, class 2/EF 40 to 45%. Stage 4 CKD, with interim ESRD and HD 04/2013 - 3/2016but no longer a HD pt. MGUS.  Prostate cancer, s/p radiation seeds. . Anemia, on po iron.  Cholelithiasis.  Gallstone pancreatitis 04/2013, elective laparoscopic cholecystectomy was planned but at the last minute it was delayed and the patient decided not to undergo the surgery. He recalls being hospitalized for about 3 days at [facility] for the pancreatitis  Hx thrombocytopenia and hepatosplenomegaly.  S/p 06/2013 liver biopsy. Showed fibrosis, no cirrhosis.  HCV and HBV negative.  06/2013 Colonoscopy: for chronic diarrhea, to cecum. Dr Zacks and Haque.  Colon polyps: adenomatous and hyperplastic.  Sigmoid tics.      Pt admitted yesterday with left chest pain, radiating to jaw/neck. For a few weeks he's been having dyspnea. Interestingly he can stationary cycle. To 2 hours without shortness of breath but he will get short of breath when walking on flat surfaces to his truck, also experiences dyspnea dyspnea when laying flat.  Pain improved with IV NTG.  Pulmonary edema on CXR.   Troponins are elevated but so is BNP.   Hgb was 9.0 in 08/2014.  It was 8.7 yesterday and 7.8 today despite 1 liter diuresis. MCV normal. Platelets stable in 60s.  INR is 1.2.   Patient denies abdominal pain, reflux disease, dysphagia. He has daily, brown bowel  movements. He does take oral iron and at that time he was a dialysis patient in 2015, he recalls having received parenteral iron. He does not recall having been treated with Epogen-like medications, though it is possible that these were given to him during dialysis. Patient denies unusual bleeding or significant bruising. No hemoptysis, no bloody urine.  No use of NSAIDs.      Past Medical History  Diagnosis Date  . Hypertension   . Hypercholesterolemia   . Prostate cancer   . Hepatosplenomegaly 2015    fibrosis, no cirrhosis on 06/2013 liver biopsy  . Thrombocytopenia 2015  . Myocardial infarction 2001  . Depression with anxiety   . IDDM (insulin dependent diabetes mellitus)     INSULIN DEPENDENT  . GERD (gastroesophageal reflux disease)   . History of shingles   . Ischemic cardiomyopathy     a. echo 08/06/2014: EF 25-30%, multiple WMA, mod DD, PASP 49 mm Hg   . CKD (chronic kidney disease), stage IV     a. previously on dialysis; b. followed by Dr. Kolloru   . Coronary artery disease, occlusive     a. s/p 4v CABG 2001(LIMA-LAD, SVG-D2, SVG-OM1, SVG-right   PDA, SVG-right PL1); b. cath 07/30/2014 vein grafts all down, patent LIMA to LAD, significant LM and ost LCx dx; c. cath 08/07/2014 Synergy DES 2.5 mm x 18 mm to dist LM and ost LCx, rec lifelong DAPT  . Coronary artery disease involving coronary bypass graft 08/2014    all vein grafts occluded. Patent LIMA-LAD -- s/p PCI ot LM-Cx;   . Chronic combined systolic and diastolic CHF, NYHA class 2     a. echo 08/2013: EF 40-45%, impaired relaxation, mild MR, LA mildly dilated,    . Gallstone pancreatitis 2015    Past Surgical History  Procedure Laterality Date  . Cardiac surgery    . Shoulder arthroscopy    . Penile prosthesis implant    . Myocardial infarction    . Cardiac catheterization    . Coronary angioplasty    . Coronary artery bypass graft  2001  . Colonoscopy    . Liver biopsy    . Av fistula placement Left 2015  . Cardiac  catheterization N/A 07/30/2014    Procedure: Left Heart Cath and Cors/Grafts Angiography;  Surgeon: Shaukat A Khan, MD;  Location: [facility] INVASIVE CV LAB;  Service: Cardiovascular;  Laterality: N/A;  . Cardiac catheterization N/A 08/07/2014    Procedure: Coronary Stent Intervention;  Surgeon: Jonathan J Berry, MD;  Location: [facility] INVASIVE CV LAB;  Service: Cardiovascular;  Laterality: N/A;    Prior to Admission medications   Medication Sig Start Date End Date Taking? Authorizing Provider  aspirin 81 MG tablet Take 81 mg by mouth daily.   Yes Historical Provider, MD  calcitRIOL (ROCALTROL) 0.25 MCG capsule Take 0.25 mcg by mouth daily.    Yes Historical Provider, MD  carvedilol (COREG) 6.25 MG tablet Take 6.25 mg by mouth 2 (two) times daily with a meal.   Yes Historical Provider, MD  clopidogrel (PLAVIX) 75 MG tablet Take 1 tablet (75 mg total) by mouth daily. 08/18/14  Yes Scott T Weaver, PA-C  ferrous sulfate 325 (65 FE) MG EC tablet Take 325 mg by mouth daily with breakfast.    Yes Historical Provider, MD  hydrALAZINE (APRESOLINE) 100 MG tablet Take 0.5 tablets (50 mg total) by mouth 3 (three) times daily. Patient taking differently: Take 100 mg by mouth 2 (two) times daily.  10/14/14  Yes David W Harding, MD  isosorbide mononitrate (IMDUR) 30 MG 24 hr tablet Take 30 mg by mouth at bedtime. 10/26/14  Yes Historical Provider, MD  isosorbide mononitrate (IMDUR) 60 MG 24 hr tablet Take 1.5 tablets (90 mg total) by mouth daily. Patient taking differently: Take 60 mg by mouth daily.  09/11/14  Yes Ryan M Dunn, PA-C  LANTUS SOLOSTAR 100 UNIT/ML Solostar Pen Inject 20 Units into the skin at bedtime.    Yes Historical Provider, MD  LYRICA 75 MG capsule Take 75 mg by mouth 2 (two) times daily.    Yes Historical Provider, MD  nitroGLYCERIN (NITROLINGUAL) 0.4 MG/SPRAY spray Place 1 spray under the tongue every 5 (five) minutes x 3 doses as needed for chest pain. Do not use together with sublingual nitro 08/08/14  Yes  Hao Meng, PA  NITROSTAT 0.4 MG SL tablet place 1 tablet under the tongue if needed every 5 minutes for che...  (REFER TO PRESCRIPTION NOTES). 08/08/14  Yes Historical Provider, MD  ranolazine (RANEXA) 1000 MG SR tablet Take 1 tablet (1,000 mg total) by mouth 2 (two) times daily. 09/11/14  Yes Ryan M Dunn, PA-C  rosuvastatin (CRESTOR) 20 MG tablet Take   20 mg by mouth daily.   Yes Historical Provider, MD  sodium bicarbonate 650 MG tablet Take 650 mg by mouth 2 (two) times daily.    Yes Historical Provider, MD  sodium polystyrene (KAYEXALATE) 15 GM/60ML suspension Take 15 g by mouth 3 (three) times a week. Mon / Wed / Fri   Yes Historical Provider, MD  torsemide (DEMADEX) 20 MG tablet Take 1 tablet (20 mg total) by mouth daily. Patient taking differently: Take 20 mg by mouth daily. Another 20 mg if needed for weight gain 08/27/14  Yes Doreene Burke Kilroy, PA-C  carvedilol (COREG) 12.5 MG tablet Take 0.5 tablets (6.25 mg total) by mouth 2 (two) times daily with a meal. Patient not taking: Reported on 11/03/2014 08/08/14   Almyra Deforest, PA    Scheduled Meds: . [START ON 11/05/2014] aspirin EC  81 mg Oral Daily  . calcitRIOL  0.25 mcg Oral Daily  . carvedilol  6.25 mg Oral BID WC  . clopidogrel  75 mg Oral Daily  . ferrous sulfate  325 mg Oral Q breakfast  . furosemide  80 mg Intravenous BID  . [START ON 11/05/2014] hydrALAZINE  100 mg Oral BID  . insulin aspart  0-24 Units Subcutaneous TID WC  . insulin glargine  20 Units Subcutaneous QHS  . pregabalin  75 mg Oral BID  . ranolazine  1,000 mg Oral BID  . rosuvastatin  20 mg Oral Daily  . sodium bicarbonate  650 mg Oral BID  . sodium chloride  3 mL Intravenous Q12H  . sodium polystyrene  15 g Oral Once per day on Mon Wed Fri   Infusions: . nitroGLYCERIN 25 mcg/min (11/04/14 0056)   PRN Meds: sodium chloride, acetaminophen, furosemide, nitroGLYCERIN, ondansetron (ZOFRAN) IV, sodium chloride   Allergies as of 11/03/2014 - Review Complete 11/03/2014  Allergen  Reaction Noted  . Sulfa antibiotics Itching 04/02/2013  . Morphine and related  11/03/2014    Family History  Problem Relation Age of Onset  . Acute myelogenous leukemia Brother   . CAD Brother   . Diabetes Mellitus II Brother   . Heart disease Father 30    CABG    Social History   Social History  . Marital Status: Legally Separated    Spouse Name: N/A  . Number of Children: N/A  . Years of Education: N/A   Occupational History  . Not on file.   Social History Main Topics  . Smoking status: Former Smoker    Quit date: 02/07/2008  . Smokeless tobacco: Former Systems developer  . Alcohol Use: No  . Drug Use: No  . Sexual Activity: Not on file   Other Topics Concern  . Not on file   Social History Narrative    REVIEW OF SYSTEMS: Constitutional:  Patient has gained about 5 pounds in the last couple of months. ENT:  No nose bleeds Pulm:  Per HPI.  No cough CV:  No palpitations, no LE edema. Chest pain per HPI GU:  No hematuria, no frequency GI:  Per HPI Heme:  Per HPI   Transfusions:  Per HPI Neuro:  No headaches.  Underwent dilated eye exam within the last 3-4 weeks and just got a new prescription for bifocals. He occasionally has blurry vision which he attributes to elevated blood sugars. History of tingling and numbness in his feet, this is well controlled with Lyrica Derm:  No itching, no rash or sores.  Endocrine:  No sweats or chills.  No polyuria or dysuria Immunization:  Did not inquire. Travel:  Had been in [place] last week.   PHYSICAL EXAM: Vital signs in last 24 hours: Filed Vitals:   11/04/14 1142  BP: 118/65  Pulse: 66  Temp: 97.6 F (36.4 C)  Resp: 18   Wt Readings from Last 3 Encounters:  11/04/14 220 lb 4.8 oz (99.927 kg)  10/14/14 218 lb (98.884 kg)  09/11/14 218 lb 4 oz (98.998 kg)    General: Pleasant, chronically ill appearing but comfortable WM. Sallow/ashen color and consistent with CK D. Head:  No facial asymmetry, no signs of head trauma.  No facial edema.  Eyes:  No scleral icterus Ears:  Not HOH.  Nose:  No congestion or discharge. Mouth:  Moist, clear, pink mucosa. Upper and lower full dentures. Neck:  Positive JVD. No thyromegaly or masses. Lungs:  No dyspnea, no cough.  Clearish to auscultation bilaterally. Heart: RRR.  Slight systolic murmur. S1/S2 audible. Abdomen:  Soft, not tender, not distended. Liver edge is 10-12 cm below costal margin. Fullness in the left upper quadrant but spleen tip not palpated.  Some small to moderate amount of bruising on the right where insulin has been injected Rectal: No masses. Scant, brown stool is FOBT negative. No hemorrhoids.   Musc/Skeltl: No joint erythema, swelling or contracture former T. Extremities:  Scant, nonpitting, left pedal/ankle swelling Neurologic:  Oriented 3. Limb strength full bilaterally. No tremor. Skin:  Bronzed (was just in [place]) and somewhat ashen, looking of a patient with advanced CKD.  No telangiectasias. Tattoos:  None Nodes:  No cervical or inguinal adenopathy.   Psych:  Very pleasant, calm, appropriate.  Intake/Output from previous day: 09/27 0701 - 09/28 0700 In: 480 [P.O.:480] Out: 1500 [Urine:1500] Intake/Output this shift: Total I/O In: -  Out: 900 [Urine:900]  LAB RESULTS:  Recent Labs  11/03/14 2145 11/04/14 0536  WBC 5.1 4.2  HGB 8.7* 7.8*  HCT 27.7* 24.6*  PLT 69* 67*   BMET Lab Results  Component Value Date   NA 140 11/04/2014   NA 139 11/03/2014   NA 143 09/11/2014   K 3.3* 11/04/2014   K 3.8 11/03/2014   K 4.0 09/11/2014   CL 108 11/04/2014   CL 105 11/03/2014   CL 102 09/11/2014   CO2 22 11/04/2014   CO2 23 11/03/2014   CO2 22 09/11/2014   GLUCOSE 158* 11/04/2014   GLUCOSE 263* 11/03/2014   GLUCOSE 200* 09/11/2014   BUN 62* 11/04/2014   BUN 60* 11/03/2014   BUN 57* 09/11/2014   CREATININE 4.63* 11/04/2014   CREATININE 4.73* 11/03/2014   CREATININE 4.15* 09/11/2014   CALCIUM 7.9* 11/04/2014   CALCIUM  7.9* 11/03/2014   CALCIUM 7.8* 09/11/2014   LFT No results for input(s): PROT, ALBUMIN, AST, ALT, ALKPHOS, BILITOT, BILIDIR, IBILI in the last 72 hours. PT/INR Lab Results  Component Value Date   INR 1.29 11/04/2014   INR 1.1 11/06/2012   Hepatitis Panel No results for input(s): HEPBSAG, HCVAB, HEPAIGM, HEPBIGM in the last 72 hours. C-Diff No components found for: CDIFF Lipase  No results found for: LIPASE  Drugs of Abuse  No results found for: LABOPIA, COCAINSCRNUR, LABBENZ, AMPHETMU, THCU, LABBARB   RADIOLOGY STUDIES: Dg Chest Portable 1 View  11/03/2014   CLINICAL DATA:  Chest pain and shortness of breath.  EXAM: PORTABLE CHEST 1 VIEW  COMPARISON:  Frontal and lateral views 08/2014  FINDINGS: There is assessed median sternotomy. Cardiomegaly is again seen, question increased versus secondary to portable technique. There is  perivascular haziness, particularly in the lower lobes, concerning for pulmonary edema. No confluent airspace disease to suggest pneumonia. No large pleural effusion or pneumothorax.  IMPRESSION: Suspect pulmonary edema. Cardiomegaly, may be increased from prior versus accentuated by portable technique.   Electronically Signed   By: Melanie  Ehinger M.D.   On: 11/03/2014 22:07    ENDOSCOPIC STUDIES: Per HPI.    IMPRESSION:   *  Acute on chronic anemia.  On chronic po iron.  FOBT negative per my rectal exam today, though stool specimen scant. Attending medical team has ordered patient transfused with 1 unit PRBCs which is just starting now.  *  CAD.  CABG 2001, DES 08/2014.  Now with CHF and elevated troponin.  *  chronic ASA/Plavix  *  MGUS.  His twin brother died from  multiple myeloma which transformed into AML. Previous bone marrow biopsy. Followed by a hematologist in [place].  *  Stage 4 CKD.  Required HD for about a year, stopped ~ 04/2014.   *  Thrombocytopenia, stable.  Hx hepatosplenomegaly.  Fibrosis but no cirrhosis or fatty liver on liver  biopsy 06/2013.   *  Adenomatous and HP colon polyps, sigmoid tics on colonoscopy in 06/2013.    *  History of prostate cancer, treated with radiation seed implantation 2010.  *  04/2013 gallstone pancreatitis.  GB intact.     PLAN:     *  No plans for colonoscopic or endoscopic workup.  *  Transfusing with 1 PRBC now.  Recheck CBC in the morning.  Heme occult his next stool. Continue oral iron.  Did not order an anemia profile as he is about to begin his transfusion and he transfusion will result in inaccurate anemia studies.  *  Patient has an upcoming appointment with his hematologist in . At that point he could undergo iron/anemia studies.  Hematologist can decide whether he cold benefit fromNeupogen/Procrit or parenteral iron.      Sarah Gribbin  11/04/2014, 12:45 PM Pager: [phone]      

## 2014-11-05 ENCOUNTER — Other Ambulatory Visit: Payer: Medicare Other

## 2014-11-05 ENCOUNTER — Other Ambulatory Visit: Payer: Self-pay

## 2014-11-05 DIAGNOSIS — N179 Acute kidney failure, unspecified: Secondary | ICD-10-CM

## 2014-11-05 DIAGNOSIS — E1121 Type 2 diabetes mellitus with diabetic nephropathy: Secondary | ICD-10-CM

## 2014-11-05 LAB — BASIC METABOLIC PANEL
ANION GAP: 12 (ref 5–15)
BUN: 65 mg/dL — ABNORMAL HIGH (ref 6–20)
CO2: 21 mmol/L — AB (ref 22–32)
Calcium: 8 mg/dL — ABNORMAL LOW (ref 8.9–10.3)
Chloride: 107 mmol/L (ref 101–111)
Creatinine, Ser: 4.93 mg/dL — ABNORMAL HIGH (ref 0.61–1.24)
GFR calc Af Amer: 13 mL/min — ABNORMAL LOW (ref >60)
GFR calc non Af Amer: 11 mL/min — ABNORMAL LOW (ref >60)
GLUCOSE: 108 mg/dL — AB (ref 65–99)
Potassium: 3.5 mmol/L (ref 3.5–5.1)
Sodium: 140 mmol/L (ref 135–145)

## 2014-11-05 LAB — URINALYSIS, ROUTINE W REFLEX MICROSCOPIC
Bilirubin Urine: NEGATIVE
Glucose, UA: NEGATIVE mg/dL
HGB URINE DIPSTICK: NEGATIVE
Ketones, ur: NEGATIVE mg/dL
LEUKOCYTES UA: NEGATIVE
Nitrite: NEGATIVE
Specific Gravity, Urine: 1.01 (ref 1.005–1.030)
UROBILINOGEN UA: 0.2 mg/dL (ref 0.0–1.0)
pH: 6 (ref 5.0–8.0)

## 2014-11-05 LAB — CBC
HEMATOCRIT: 25.4 % — AB (ref 39.0–52.0)
Hemoglobin: 8.3 g/dL — ABNORMAL LOW (ref 13.0–17.0)
MCH: 27.7 pg (ref 26.0–34.0)
MCHC: 32.7 g/dL (ref 30.0–36.0)
MCV: 84.7 fL (ref 78.0–100.0)
Platelets: 67 10*3/uL — ABNORMAL LOW (ref 150–400)
RBC: 3 MIL/uL — ABNORMAL LOW (ref 4.22–5.81)
RDW: 16.2 % — AB (ref 11.5–15.5)
WBC: 4.2 10*3/uL (ref 4.0–10.5)

## 2014-11-05 LAB — TYPE AND SCREEN
ABO/RH(D): A POS
ANTIBODY SCREEN: NEGATIVE
UNIT DIVISION: 0

## 2014-11-05 LAB — HEMOGLOBIN A1C
HEMOGLOBIN A1C: 6.5 % — AB (ref 4.8–5.6)
MEAN PLASMA GLUCOSE: 140 mg/dL

## 2014-11-05 LAB — URINE MICROSCOPIC-ADD ON

## 2014-11-05 LAB — GLUCOSE, CAPILLARY
GLUCOSE-CAPILLARY: 125 mg/dL — AB (ref 65–99)
Glucose-Capillary: 108 mg/dL — ABNORMAL HIGH (ref 65–99)
Glucose-Capillary: 175 mg/dL — ABNORMAL HIGH (ref 65–99)
Glucose-Capillary: 251 mg/dL — ABNORMAL HIGH (ref 65–99)

## 2014-11-05 LAB — TROPONIN I
Troponin I: 4.14 ng/mL (ref <0.031)
Troponin I: 2.94 ng/mL (ref <0.031)

## 2014-11-05 MED ORDER — DOCUSATE SODIUM 100 MG PO CAPS
100.0000 mg | ORAL_CAPSULE | Freq: Two times a day (BID) | ORAL | Status: DC
  Administered 2014-11-05 – 2014-11-08 (×6): 100 mg via ORAL
  Filled 2014-11-05 (×7): qty 1

## 2014-11-05 MED ORDER — HYDRALAZINE HCL 25 MG PO TABS: 25.0000 mg | ORAL_TABLET | Freq: Two times a day (BID) | ORAL | Status: DC

## 2014-11-05 MED ORDER — FUROSEMIDE 80 MG PO TABS
80.0000 mg | ORAL_TABLET | Freq: Two times a day (BID) | ORAL | Status: DC
  Administered 2014-11-05: 80 mg via ORAL
  Filled 2014-11-05: qty 1

## 2014-11-05 MED ORDER — ALPRAZOLAM 0.25 MG PO TABS
0.2500 mg | ORAL_TABLET | Freq: Every evening | ORAL | Status: DC | PRN
  Administered 2014-11-05 – 2014-11-07 (×2): 0.25 mg via ORAL
  Filled 2014-11-05 (×2): qty 1

## 2014-11-05 MED ORDER — DARBEPOETIN ALFA 200 MCG/0.4ML IJ SOSY
200.0000 ug | PREFILLED_SYRINGE | Freq: Once | INTRAMUSCULAR | Status: AC
  Administered 2014-11-05: 200 ug via SUBCUTANEOUS
  Filled 2014-11-05: qty 0.4

## 2014-11-05 MED ORDER — ISOSORBIDE MONONITRATE ER 60 MG PO TB24
60.0000 mg | ORAL_TABLET | Freq: Two times a day (BID) | ORAL | Status: DC
  Administered 2014-11-05 – 2014-11-08 (×6): 60 mg via ORAL
  Filled 2014-11-05 (×6): qty 1

## 2014-11-05 MED ORDER — SODIUM BICARBONATE 650 MG PO TABS
1300.0000 mg | ORAL_TABLET | Freq: Two times a day (BID) | ORAL | Status: DC
  Administered 2014-11-05 – 2014-11-08 (×6): 1300 mg via ORAL
  Filled 2014-11-05 (×6): qty 2

## 2014-11-05 MED ORDER — POLYETHYLENE GLYCOL 3350 17 G PO PACK
17.0000 g | PACK | Freq: Every day | ORAL | Status: DC | PRN
  Administered 2014-11-05 – 2014-11-06 (×2): 17 g via ORAL
  Filled 2014-11-05 (×2): qty 1

## 2014-11-05 MED ORDER — NITROGLYCERIN IN D5W 200-5 MCG/ML-% IV SOLN: 0.0000 ug/min | INTRAVENOUS | Status: DC

## 2014-11-05 MED ORDER — HYDRALAZINE HCL 50 MG PO TABS
50.0000 mg | ORAL_TABLET | Freq: Two times a day (BID) | ORAL | Status: DC
  Administered 2014-11-05 – 2014-11-08 (×6): 50 mg via ORAL
  Filled 2014-11-05 (×6): qty 1

## 2014-11-05 NOTE — Progress Notes (Signed)
CARDIAC REHAB PHASE I   PRE:  Rate/Rhythm: 75 SR    BP: sitting 150/70    SaO2: 95 RA  MODE:  Ambulation: 1000 ft   POST:  Rate/Rhythm: 82 SR    BP: sitting 154/60     SaO2: 92-94 RA  Pt has been walking 2000 ft at a time today. No CP. I walked with him fairly briskly and no CP. VSS. Will f/u tomorrow. [place], [place], ACSM 11/05/2014 3:06 PM

## 2014-11-05 NOTE — Progress Notes (Signed)
69 yo man with PMH of CAD s/p CABG '01 at [facility] (LIMA-LAD, SVG-D2, SVG-OM1, SVG-right PDA, SVG-right PL1) along with previous PCI with ischemic cardiomyopathy and chronic systolic heart failure last known EF 25-30% by 6/16 echo, stage IV CKD (has had dialysis previously), chronic thrombocytopenia, T2DM, prostate cancer who was seen in clinic 10/14/14 by Dr. Ellyn Hack.   He had a cardiac catheterization by Dr. Humphrey Rolls in [facility] 07/30/14 revealing all vein grafts occluded, (known dating to '09), patent LIMA to LAD, 70% ostial left main lesion, 70% ostial LCx, occluded RCA. He underwent PCI by Dr. Gwenlyn Found 08/07/14 for critical left main and ostial LCx at 90% both and had synergy DES to both. he patient underwent cardiac catheterization with Dr. Gwenlyn Found 08/07/14. He was noted to have critical left main disease and ostial left circumflex disease, both 90% stenosed and treated with synergy DES.  Subjective: No complaints -- walking in the hallway without any difficulty. No chest discomfort or significant dyspnea.  Brisk diuresis of close to 3-1/2 L since admission. Unfortunately his creatinine is now up to 4.9.  Objective: Vital signs in last 24 hours: Temp:  [97.5 F (36.4 C)-98.1 F (36.7 C)] 97.7 F (36.5 C) (09/29 1123) Pulse Rate:  [57-65] 61 (09/29 0400) Resp:  [18-29] 20 (09/29 0400) BP: (102-135)/(52-80) 119/66 mmHg (09/29 1123) SpO2:  [94 %-99 %] 94 % (09/29 0731) Weight:  [216 lb 8 oz (98.204 kg)] 216 lb 8 oz (98.204 kg) (09/29 0400) Last BM Date: 11/03/14  Intake/Output from previous day: 09/28 0701 - 09/29 0700 In: 840 [P.O.:480; Blood:360] Out: 3375 [Urine:3375] Intake/Output this shift:    Medications Scheduled Meds: . aspirin EC  81 mg Oral Daily  . calcitRIOL  0.25 mcg Oral Daily  . carvedilol  6.25 mg Oral BID WC  . clopidogrel  75 mg Oral Daily  . docusate sodium  100 mg Oral BID  . ferrous sulfate  325 mg Oral Q breakfast  . hydrALAZINE  50 mg Oral BID  . insulin aspart   0-24 Units Subcutaneous TID WC  . insulin glargine  20 Units Subcutaneous QHS  . isosorbide mononitrate  60 mg Oral BID  . pregabalin  75 mg Oral BID  . ranolazine  1,000 mg Oral BID  . rosuvastatin  20 mg Oral Daily  . sodium bicarbonate  1,300 mg Oral BID  . sodium chloride  3 mL Intravenous Q12H   Continuous Infusions: . nitroGLYCERIN 20 mcg/min (11/05/14 1423)   PRN Meds:.sodium chloride, acetaminophen, nitroGLYCERIN, ondansetron (ZOFRAN) IV, polyethylene glycol, sodium chloride  PE: General appearance: alert, cooperative and no distress Lungs: clear to auscultation bilaterally Heart: regular rate and rhythm, S1, S2 normal, no murmur, click, rub or gallop Extremities: 1+ LEE Pulses: 2+ and symmetric Skin: Warm and dry Neurologic: Grossly normal  Lab Results:   Recent Labs  11/04/14 0536 11/04/14 1900 11/05/14 0058  WBC 4.2 4.5 4.2  HGB 7.8* 8.8* 8.3*  HCT 24.6* 27.3* 25.4*  PLT 67* 68* 67*   BMET  Recent Labs  11/03/14 2145 11/04/14 0536 11/05/14 0655  NA 139 140 140  K 3.8 3.3* 3.5  CL 105 108 107  CO2 23 22 21*  GLUCOSE 263* 158* 108*  BUN 60* 62* 65*  CREATININE 4.73* 4.63* 4.93*  CALCIUM 7.9* 7.9* 8.0*   PT/INR  Recent Labs  11/04/14 0536  LABPROT 16.3*  INR 1.29   Cholesterol  Recent Labs  11/04/14 0120  CHOL 113   Lipid Panel  Component Value Date/Time   CHOL 113 11/04/2014 0120   CHOL 97 04/25/2013 0532   TRIG 168* 11/04/2014 0120   TRIG 113 04/25/2013 0532   HDL 26* 11/04/2014 0120   HDL 33* 04/25/2013 0532   CHOLHDL 4.3 11/04/2014 0120   VLDL 34 11/04/2014 0120   VLDL 23 04/25/2013 0532   LDLCALC 53 11/04/2014 0120   LDLCALC 41 04/25/2013 0532   Cardiac Panel (last 3 results)  Recent Labs  11/04/14 1900 11/05/14 0058 11/05/14 0655  TROPONINI 5.89* 4.14* 2.94*     Assessment/Plan   Principal Problem:  NSTEMI (non-ST elevated myocardial infarction)   ACS (acute coronary syndrome) -Based on the canal such  for an elevation, and is difficult to say but this was not a non-ST duration MI. Unclear if she was one is the cause. Echocardiogram appeared better when compared to previously echo, although the regional wall motions are relatively similar. This time was read as having obstructive going pattern consistent with grade 3 diastolic dysfunction -- this would argue whether or not the diastolic dysfunction T positive troponins and heart failure versus not she wished him I'm letting worsening diastolic function. - Unfortunately, with worsening creatinine, no plans for invasive evaluation at this time.    Type 2 diabetes mellitus with renal manifestations  Continue lantus and SS.     Chronic renal disease, stage IV - with acute on chronic renal failure  Creatinine has gotten worse with diuresis, however he still making good urine. We have counseled to nephrology. Questions were nephrology would be appropriate management for likely because he is related anemia. He is also due to see in hematology oncologist in the future as he has thrombocytopenia as well.    I have stopped Lasix for now pending nephrology evaluation. He does not seem to be volume overloaded at this time.    Anemia, Acute on chronic Mild increase after one unit of transfusion. Unfortunately as an oversight and his anemia panel was not checked prior to transfusion. Guaiac stools Seen by GI with no plans for endoscopy despite brisk drop in hemoglobin.  he is on oral iron supplementation already -- question if you benefit from EPO  As the troponin level was declining, would not start IV heparin at this point.    Acute on Chronic combined systolic and diastolic CHF, NYHA class 2 Brisk diuresis with IV Lasix, however now with worsening renal function where backing off of Lasix having held for now.   Essential hypertension  Well controlled at this time.  - Wean off nitroglycerin drip. Increased back to half of his own dose of hydralazine and  change Imdur to 60 mg twice a day.   CAD S/P PCI/DES July 2016 -- he is on dual anti-platelet therapy, statin, beta blocker. temporary from IV nitroglycerin to oral Imdur 60 mg twice a day. Also on high-dose Ranexa.  Ischemic cardiomyopathy: Reduced EF remains less than 35%. CAD PVC burn on telemetry.  I have asked electrophysiology (Dr. Caryl Comes) the patient while he is here for consideration of possible ICD. Unfortunately this current event would push them back at least 40 days)    at this point, we're awaiting improvement of renal function. Symptomatically he is deathly better. No signs of volume overload. Main concern now is return of renal function. Of course the presence of severe chronic kidney disease is a concern when he comes deciding about ICD placement as well.   LOS: 2 days   Leonie Man, M.D., M.S. Interventional  Cardiologist   Pager # [phone]

## 2014-11-05 NOTE — Progress Notes (Signed)
Pharmacist Heart Failure Core Measure Documentation  Assessment: Joseph [name] has an EF documented as 30-35% on 11/04/14 by ECHO.  Rationale: Heart failure patients with left ventricular systolic dysfunction (LVSD) and an EF < 40% should be prescribed an angiotensin converting enzyme inhibitor (ACEI) or angiotensin receptor blocker (ARB) at discharge unless a contraindication is documented in the medical record.  This patient is not currently on an ACEI or ARB for HF.  This note is being placed in the record in order to provide documentation that a contraindication to the use of these agents is present for this encounter.  ACE Inhibitor or Angiotensin Receptor Blocker is contraindicated (specify all that apply)  []   ACEI allergy AND ARB allergy []   Angioedema []   Moderate or severe aortic stenosis []   Hyperkalemia []   Hypotension []   Renal artery stenosis [x]   Worsening renal function, preexisting renal disease or dysfunction   [name] 11/05/2014 1:10 PM

## 2014-11-05 NOTE — Consult Note (Signed)
[facility] CONSULT NOTE    Date: 11/05/2014                  Patient Name:  Joseph [name]  MRN: [id number]  DOB: [birth year]  Age / Sex: 69 y.o., male         PCP: Marden Noble, MD                 Service Requesting Consult: Cardiology                 Reason for Consult: AKI on CKD Stage IV            History of Present Illness: Joseph [name] is a 69 y.o. man with a PMHx of CAD s/p CABG in 2001, ischemic cardiomyopathy and chronic systolic heart failure (EF 25-30% per 07/2014 echo), HTN, type 2 DM, prostate cancer, chronic thrombocytopenia, and CKD Stage IV (previously on HD in 2015 but recovered renal function), who was admitted to [facility] on 11/03/2014 for evaluation of chest pain and dyspnea. Patient was hospitalized from 6/30 to 7/2 for an NSTEMI and underwent cardiac cath which showed distal left main stenosis and ostial left circumflex stenosis which was treated with Synergy DES. Patient reports increased dyspnea for several weeks but only when walking around. He describes being able to ride the bicycle at cardiac rehab for over an hour without any problems, but when he walks he can only go a short distance before having to stop and catch his breath. He denies any chest pain currently. Cardiology has determined his chest pain to be related to demand ischemia in the setting of acute on chronic heart failure. BNP noted to be elevated at 2287 and troponins elevated up to 6.92 but trending down now. He has received a total of Lasix 80 mg IV TID yesterday and Lasix 80 mg IV today. Weight 220 lbs on admission and he is down 4 lbs today.   Patient has a history of previously being on dialysis from March 2015 to October 2015 after AKI from coronary angiography and then recovered renal function. He has a viable fistula in his left forearm. On this admission, his was noted to have a Cr 4.73 and this has remained stable in the 4.6-4.9 range. Baseline Cr is between 3.5-4.0 over the  past year per available labs. UOP has been excellent while on Lasix therapy with 3,375 ml over the past 24 hours. He follows with Dr. Juleen China in [place] (nephrology).    Medications: Outpatient medications: Prescriptions prior to admission  Medication Sig Dispense Refill Last Dose  . aspirin 81 MG tablet Take 81 mg by mouth daily.   11/03/2014 at Unknown time  . calcitRIOL (ROCALTROL) 0.25 MCG capsule Take 0.25 mcg by mouth daily.    11/03/2014 at Unknown time  . carvedilol (COREG) 6.25 MG tablet Take 6.25 mg by mouth 2 (two) times daily with a meal.   11/03/2014 at 1900  . clopidogrel (PLAVIX) 75 MG tablet Take 1 tablet (75 mg total) by mouth daily. 90 tablet 3 11/03/2014 at Unknown time  . ferrous sulfate 325 (65 FE) MG EC tablet Take 325 mg by mouth daily with breakfast.    11/03/2014 at Unknown time  . hydrALAZINE (APRESOLINE) 100 MG tablet Take 0.5 tablets (50 mg total) by mouth 3 (three) times daily. (Patient taking differently: Take 100 mg by mouth 2 (two) times daily. ) 90 tablet 3 11/03/2014 at Unknown time  . isosorbide mononitrate (IMDUR)  30 MG 24 hr tablet Take 30 mg by mouth at bedtime.  0 11/03/2014 at Unknown time  . isosorbide mononitrate (IMDUR) 60 MG 24 hr tablet Take 1.5 tablets (90 mg total) by mouth daily. (Patient taking differently: Take 60 mg by mouth daily. ) 45 tablet 6 11/03/2014 at Unknown time  . LANTUS SOLOSTAR 100 UNIT/ML Solostar Pen Inject 20 Units into the skin at bedtime.    11/03/2014 at Unknown time  . LYRICA 75 MG capsule Take 75 mg by mouth 2 (two) times daily.   0 11/03/2014 at Unknown time  . nitroGLYCERIN (NITROLINGUAL) 0.4 MG/SPRAY spray Place 1 spray under the tongue every 5 (five) minutes x 3 doses as needed for chest pain. Do not use together with sublingual nitro 12 g 5 11/03/2014 at Unknown time  . NITROSTAT 0.4 MG SL tablet place 1 tablet under the tongue if needed every 5 minutes for che...  (REFER TO PRESCRIPTION NOTES).  0 unknown  . ranolazine (RANEXA)  1000 MG SR tablet Take 1 tablet (1,000 mg total) by mouth 2 (two) times daily. 60 tablet 6 11/03/2014 at Unknown time  . rosuvastatin (CRESTOR) 20 MG tablet Take 20 mg by mouth daily.   11/03/2014 at Unknown time  . sodium bicarbonate 650 MG tablet Take 650 mg by mouth 2 (two) times daily.    11/03/2014 at Unknown time  . sodium polystyrene (KAYEXALATE) 15 GM/60ML suspension Take 15 g by mouth 3 (three) times a week. Mon / Wed / Fri   11/02/2014 at Unknown time  . torsemide (DEMADEX) 20 MG tablet Take 1 tablet (20 mg total) by mouth daily. (Patient taking differently: Take 20 mg by mouth daily. Another 20 mg if needed for weight gain) 30 tablet 11 11/03/2014 at Unknown time  . carvedilol (COREG) 12.5 MG tablet Take 0.5 tablets (6.25 mg total) by mouth 2 (two) times daily with a meal. (Patient not taking: Reported on 11/03/2014) 60 tablet 2 Not Taking at Unknown time    Current medications: Current Facility-Administered Medications  Medication Dose Route Frequency Provider Last Rate Last Dose  . 0.9 %  sodium chloride infusion  250 mL Intravenous PRN Jules Husbands, MD      . acetaminophen (TYLENOL) tablet 650 mg  650 mg Oral Q4H PRN Jules Husbands, MD      . aspirin EC tablet 81 mg  81 mg Oral Daily Jules Husbands, MD   81 mg at 11/05/14 0758  . calcitRIOL (ROCALTROL) capsule 0.25 mcg  0.25 mcg Oral Daily Jules Husbands, MD   0.25 mcg at 11/05/14 0758  . carvedilol (COREG) tablet 6.25 mg  6.25 mg Oral BID WC Jules Husbands, MD   6.25 mg at 11/05/14 0756  . clopidogrel (PLAVIX) tablet 75 mg  75 mg Oral Daily Jules Husbands, MD   75 mg at 11/05/14 0757  . docusate sodium (COLACE) capsule 100 mg  100 mg Oral BID Almyra Deforest, PA   100 mg at 11/05/14 1028  . ferrous sulfate tablet 325 mg  325 mg Oral Q breakfast Jules Husbands, MD   325 mg at 11/05/14 0758  . hydrALAZINE (APRESOLINE) tablet 25 mg  25 mg Oral BID Mauricia Area, MD      . insulin aspart (novoLOG) injection 0-24 Units  0-24 Units Subcutaneous TID WC Jules Husbands, MD    4 Units at 11/05/14 1221  . insulin glargine (LANTUS) injection 20 Units  20 Units Subcutaneous QHS Jules Husbands, MD   20 Units at 11/04/14  2203  . nitroGLYCERIN (NITROSTAT) SL tablet 0.4 mg  0.4 mg Sublingual Q5 Min x 3 PRN Jules Husbands, MD      . nitroGLYCERIN 50 mg in dextrose 5 % 250 mL (0.2 mg/mL) infusion  0-200 mcg/min Intravenous Titrated Jules Husbands, MD 7.5 mL/hr at 11/04/14 0056 25 mcg/min at 11/04/14 0056  . ondansetron (ZOFRAN) injection 4 mg  4 mg Intravenous Q6H PRN Jules Husbands, MD      . polyethylene glycol (MIRALAX / GLYCOLAX) packet 17 g  17 g Oral Daily PRN Almyra Deforest, PA   17 g at 11/05/14 1028  . pregabalin (LYRICA) capsule 75 mg  75 mg Oral BID Jules Husbands, MD   75 mg at 11/05/14 0757  . ranolazine (RANEXA) 12 hr tablet 1,000 mg  1,000 mg Oral BID Jules Husbands, MD   1,000 mg at 11/05/14 0758  . rosuvastatin (CRESTOR) tablet 20 mg  20 mg Oral Daily Jules Husbands, MD   20 mg at 11/05/14 0758  . sodium bicarbonate tablet 1,300 mg  1,300 mg Oral BID Mauricia Area, MD      . sodium chloride 0.9 % injection 3 mL  3 mL Intravenous Q12H Jules Husbands, MD   3 mL at 11/05/14 0759  . sodium chloride 0.9 % injection 3 mL  3 mL Intravenous PRN Jules Husbands, MD          Allergies: Allergies  Allergen Reactions  . Sulfa Antibiotics Itching    hives  . Morphine And Related     Makes patient feel weird      Past Medical History: Past Medical History  Diagnosis Date  . Hypertension   . Hypercholesterolemia   . Prostate cancer   . Hepatosplenomegaly 2015    fibrosis, no cirrhosis on 06/2013 liver biopsy  . Thrombocytopenia 2015  . Myocardial infarction 2001  . Depression with anxiety   . IDDM (insulin dependent diabetes mellitus)     INSULIN DEPENDENT  . GERD (gastroesophageal reflux disease)   . History of shingles   . Ischemic cardiomyopathy     a. echo 08/06/2014: EF 25-30%, multiple WMA, mod DD, PASP 49 mm Hg   . CKD (chronic kidney disease), stage IV     a. previously on  dialysis; b. followed by Dr. Abigail Butts   . Coronary artery disease, occlusive     a. s/p 4v CABG 2001(LIMA-LAD, SVG-D2, SVG-OM1, SVG-right PDA, SVG-right PL1); b. cath 07/30/2014 vein grafts all down, patent LIMA to LAD, significant LM and ost LCx dx; c. cath 08/07/2014 Synergy DES 2.5 mm x 18 mm to dist LM and ost LCx, rec lifelong DAPT  . Coronary artery disease involving coronary bypass graft 08/2014    all vein grafts occluded. Patent LIMA-LAD -- s/p PCI ot LM-Cx;   [name] Chronic combined systolic and diastolic CHF, NYHA class 2     a. echo 08/2013: EF 40-45%, impaired relaxation, mild MR, LA mildly dilated,    . Gallstone pancreatitis 2015     Past Surgical History: Past Surgical History  Procedure Laterality Date  . Cardiac surgery    . Shoulder arthroscopy    . Penile prosthesis implant    . Myocardial infarction    . Cardiac catheterization    . Coronary angioplasty    . Coronary artery bypass graft  2001  . Colonoscopy    . Liver biopsy    . Av fistula placement Left 2015  . Cardiac catheterization N/A 07/30/2014    Procedure: Left Heart Cath  and Cors/Grafts Angiography;  Surgeon: Dionisio David, MD;  Location: [facility];  Service: Cardiovascular;  Laterality: N/A;  . Cardiac catheterization N/A 08/07/2014    Procedure: Coronary Stent Intervention;  Surgeon: Lorretta Harp, MD;  Location: [facility];  Service: Cardiovascular;  Laterality: N/A;     Family History: Family History  Problem Relation Age of Onset  . Acute myelogenous leukemia Brother   . CAD Brother   . Diabetes Mellitus II Brother   . Heart disease Father 55    CABG     Social History: Social History   Social History  . Marital Status: Legally Separated    Spouse Name: N/A  . Number of Children: N/A  . Years of Education: N/A   Occupational History  . Not on file.   Social History Main Topics  . Smoking status: Former Smoker    Quit date: 02/07/2008  . Smokeless tobacco: Former  Systems developer  . Alcohol Use: No  . Drug Use: No  . Sexual Activity: Not on file   Other Topics Concern  . Not on file   Social History Narrative     Review of Systems: As per HPI  Vital Signs: Blood pressure 119/66, pulse 61, temperature 97.7 F (36.5 C), temperature source Oral, resp. rate 20, height 5\' 10"  (1.778 m), weight 216 lb 8 oz (98.204 kg), SpO2 94 %.  Weight trends: Filed Weights   11/04/14 0023 11/04/14 0422 11/05/14 0400  Weight: 220 lb 4.8 oz (99.927 kg) 220 lb 4.8 oz (99.927 kg) 216 lb 8 oz (98.204 kg)    Physical Exam: General: sitting up in bed, pleasant, NAD HEENT: /AT, EOMI, sclera anicteric, mucus membranes moist  Fundi benign CV: RRR, systolic murmur heard best at LUSB  Pulm: CTA bilaterally, no crackles appreciated Abd: BS+, soft, non-tender Liver down 5 cm Ext: warm, 1-2+ pitting edema in his lower extremities AVF LLA B&T Neuro: alert and oriented x 3   Lab results: Basic Metabolic Panel:  Recent Labs Lab 11/03/14 2145 11/04/14 0536 11/05/14 0655  NA 139 140 140  K 3.8 3.3* 3.5  CL 105 108 107  CO2 23 22 21*  GLUCOSE 263* 158* 108*  BUN 60* 62* 65*  CREATININE 4.73* 4.63* 4.93*  CALCIUM 7.9* 7.9* 8.0*    Liver Function Tests: No results for input(s): AST, ALT, ALKPHOS, BILITOT, PROT, ALBUMIN in the last 168 hours. No results for input(s): LIPASE, AMYLASE in the last 168 hours. No results for input(s): AMMONIA in the last 168 hours.  CBC:  Recent Labs Lab 11/03/14 2145 11/04/14 0536 11/04/14 1900 11/05/14 0058  WBC 5.1 4.2 4.5 4.2  NEUTROABS 4.1  --  3.6  --   HGB 8.7* 7.8* 8.8* 8.3*  HCT 27.7* 24.6* 27.3* 25.4*  MCV 86.6 86.0 85.0 84.7  PLT 69* 67* 68* 67*    Cardiac Enzymes:  Recent Labs Lab 11/04/14 0536 11/04/14 1255 11/04/14 1900 11/05/14 0058 11/05/14 0655  TROPONINI 5.37* 6.92* 5.89* 4.14* 2.94*    BNP: Invalid input(s): POCBNP  CBG:  Recent Labs Lab 11/04/14 1133 11/04/14 1605 11/04/14 2057  11/05/14 0733 11/05/14 1122  GLUCAP 184* 131* 205* 108* 175*    Microbiology: Results for orders placed or performed during the hospital encounter of 11/03/14  MRSA PCR Screening     Status: None   Collection Time: 11/04/14  1:11 AM  Result Value Ref Range Status   MRSA by PCR NEGATIVE NEGATIVE Final    Comment:  The GeneXpert MRSA Assay (FDA approved for NASAL specimens only), is one component of a comprehensive MRSA colonization surveillance program. It is not intended to diagnose MRSA infection nor to guide or monitor treatment for MRSA infections.     Coagulation Studies:  Recent Labs  11/04/14 0536  LABPROT 16.3*  INR 1.29    Urinalysis: No results for input(s): COLORURINE, LABSPEC, PHURINE, GLUCOSEU, HGBUR, BILIRUBINUR, KETONESUR, PROTEINUR, UROBILINOGEN, NITRITE, LEUKOCYTESUR in the last 72 hours.  Invalid input(s): APPERANCEUR    Imaging: Dg Chest Portable 1 View  11/03/2014   CLINICAL DATA:  Chest pain and shortness of breath.  EXAM: PORTABLE CHEST 1 VIEW  COMPARISON:  Frontal and lateral views 08/2014  FINDINGS: There is assessed median sternotomy. Cardiomegaly is again seen, question increased versus secondary to portable technique. There is perivascular haziness, particularly in the lower lobes, concerning for pulmonary edema. No confluent airspace disease to suggest pneumonia. No large pleural effusion or pneumothorax.  IMPRESSION: Suspect pulmonary edema. Cardiomegaly, may be increased from prior versus accentuated by portable technique.   Electronically Signed   By: Jeb Levering M.D.   On: 11/03/2014 22:07      Assessment & Plan:  1. Likely Progression of CKD: Patient with history of CKD Stage IV, but currently with GFR 11 making him CKD Stage V. He has had to be on dialysis before due to contrast-induced AKI, but was able to recover his renal function. Given that he had a recent cardiac catheterization in July and now with an acute on chronic  CHF exacerbation it is not surprising that his renal function has been steadily declining. He likely has a component of hypoperfusion related to his heart failure on top progressively worsening CKD. Interestingly, he also has a history of MGUS as well. Will optimize renal perfusion by keeping BPs slightly higher than they have been (SBPs 100s-110s) and continuing to get fluid off. Will decrease Hydralazine and switch to Lasix 80 mg PO BID. Also will check UA to assess for hematuria/proteinuria and put him on renal diet.   2. Metabolic Acidosis: Bicarb 23 on admission, currently 21. He is on sodium bicarbonate 650 mg BID at home. Will increase this to 1,300 mg BID. Should not need Kayexalate  3. 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 Last anemia panel done in 2014 and iron saturation 18% at that time. Patient is currently on ferrous sulfate 325 mg daily at home. Hgb 7.8 yesterday and he was transfused 1 unit PRBCs. GI was consulted because of concern for GI bleeding given drop in Hgb. Baseline seems to be in 9-10 range. Hgb stable at 8.3 today. He had a colonoscopy in May 2015 at [facility] which showed five 5-9 mm polyps in the sigmoid colon, hepatic flexure, and cecum. These were resected but I am unable to find path report. Will check iron studies and start on Aranesp.   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 He is on Calcitriol 0.25 mcg daily at home. Will get renal function panel tomorrow to assess phosphorus and albumin levels.   5. Acute on Chronic CHF/CAD: Weight down from 220 lbs on admission to 216 lbs today. Baseline weight unclear, but lowest weight recorded as 208 lbs in Nov 2015. He seems still significantly fluid overloaded on exam. Will place on Lasix as above. Management per Cardiology.   6. HTN: BP in 606T-016W systolic. Would like BPs to be a little higher so as to maximize renal perfusion. Will decrease Hydralazine dose.   7.  Type 2 DM: Last HbA1c 6.5 on 9/28. CBGs in 100s-180s in  hospital. He takes Lantus 20 units QHS at home. Currently is on home dose Lantus with novolog sliding scale. Management per primary team.    Thank you for this interesting consult. Attending note to follow.   Albin Felling, MD, MPH Internal Medicine Resident, PGY-II Pager: [phone]  I have seen and examined this patient and agree with the plan of care seen, examined, eval, discussed with patient and with resident .  DETERDING,JAMES L 11/05/2014, 4:35 PM

## 2014-11-06 ENCOUNTER — Telehealth: Payer: Self-pay | Admitting: *Deleted

## 2014-11-06 DIAGNOSIS — N184 Chronic kidney disease, stage 4 (severe): Secondary | ICD-10-CM

## 2014-11-06 DIAGNOSIS — D696 Thrombocytopenia, unspecified: Secondary | ICD-10-CM

## 2014-11-06 DIAGNOSIS — I5023 Acute on chronic systolic (congestive) heart failure: Secondary | ICD-10-CM

## 2014-11-06 DIAGNOSIS — D528 Other folate deficiency anemias: Secondary | ICD-10-CM

## 2014-11-06 DIAGNOSIS — I493 Ventricular premature depolarization: Secondary | ICD-10-CM

## 2014-11-06 DIAGNOSIS — N179 Acute kidney failure, unspecified: Secondary | ICD-10-CM

## 2014-11-06 DIAGNOSIS — D649 Anemia, unspecified: Secondary | ICD-10-CM

## 2014-11-06 LAB — RENAL FUNCTION PANEL
ALBUMIN: 2.8 g/dL — AB (ref 3.5–5.0)
ANION GAP: 11 (ref 5–15)
BUN: 68 mg/dL — ABNORMAL HIGH (ref 6–20)
CALCIUM: 7.8 mg/dL — AB (ref 8.9–10.3)
CO2: 26 mmol/L (ref 22–32)
Chloride: 103 mmol/L (ref 101–111)
Creatinine, Ser: 5.28 mg/dL — ABNORMAL HIGH (ref 0.61–1.24)
GFR, EST AFRICAN AMERICAN: 12 mL/min — AB (ref >60)
GFR, EST NON AFRICAN AMERICAN: 10 mL/min — AB (ref >60)
Glucose, Bld: 133 mg/dL — ABNORMAL HIGH (ref 65–99)
Phosphorus: 5.8 mg/dL — ABNORMAL HIGH (ref 2.5–4.6)
Potassium: 3.2 mmol/L — ABNORMAL LOW (ref 3.5–5.1)
SODIUM: 140 mmol/L (ref 135–145)

## 2014-11-06 LAB — GLUCOSE, CAPILLARY
GLUCOSE-CAPILLARY: 97 mg/dL (ref 65–99)
GLUCOSE-CAPILLARY: 154 mg/dL — AB (ref 65–99)
GLUCOSE-CAPILLARY: 180 mg/dL — AB (ref 65–99)
Glucose-Capillary: 188 mg/dL — ABNORMAL HIGH (ref 65–99)

## 2014-11-06 LAB — CBC
HEMATOCRIT: 28.3 % — AB (ref 39.0–52.0)
HEMOGLOBIN: 9.1 g/dL — AB (ref 13.0–17.0)
MCH: 27.6 pg (ref 26.0–34.0)
MCHC: 32.2 g/dL (ref 30.0–36.0)
MCV: 85.8 fL (ref 78.0–100.0)
Platelets: 80 10*3/uL — ABNORMAL LOW (ref 150–400)
RBC: 3.3 MIL/uL — ABNORMAL LOW (ref 4.22–5.81)
RDW: 16.3 % — ABNORMAL HIGH (ref 11.5–15.5)
WBC: 5.1 10*3/uL (ref 4.0–10.5)

## 2014-11-06 LAB — OCCULT BLOOD X 1 CARD TO LAB, STOOL: Fecal Occult Bld: POSITIVE — AB

## 2014-11-06 LAB — RETICULOCYTES
RBC.: 3.02 MIL/uL — AB (ref 4.22–5.81)
RETIC COUNT ABSOLUTE: 96.6 10*3/uL (ref 19.0–186.0)
Retic Ct Pct: 3.2 % — ABNORMAL HIGH (ref 0.4–3.1)

## 2014-11-06 LAB — IRON AND TIBC
IRON: 41 ug/dL — AB (ref 45–182)
Saturation Ratios: 14 % — ABNORMAL LOW (ref 17.9–39.5)
TIBC: 284 ug/dL (ref 250–450)
UIBC: 243 ug/dL

## 2014-11-06 LAB — FOLATE: FOLATE: 18 ng/mL (ref >5.9)

## 2014-11-06 LAB — VITAMIN B12: Vitamin B-12: 323 pg/mL (ref 180–914)

## 2014-11-06 LAB — FERRITIN: Ferritin: 173 ng/mL (ref 24–336)

## 2014-11-06 MED ORDER — FUROSEMIDE 80 MG PO TABS
120.0000 mg | ORAL_TABLET | Freq: Two times a day (BID) | ORAL | Status: DC
  Administered 2014-11-06 – 2014-11-08 (×5): 120 mg via ORAL
  Filled 2014-11-06 (×10): qty 1

## 2014-11-06 MED ORDER — SODIUM CHLORIDE 0.9 % IV SOLN
510.0000 mg | Freq: Once | INTRAVENOUS | Status: AC
  Administered 2014-11-06: 510 mg via INTRAVENOUS
  Filled 2014-11-06: qty 17

## 2014-11-06 MED ORDER — POTASSIUM CHLORIDE CRYS ER 20 MEQ PO TBCR
20.0000 meq | EXTENDED_RELEASE_TABLET | Freq: Once | ORAL | Status: AC
  Administered 2014-11-06: 20 meq via ORAL
  Filled 2014-11-06: qty 1

## 2014-11-06 NOTE — Telephone Encounter (Signed)
Patient has chronic pancytopenia, his CBC from 11/06/14 is not much changed. He does not need immediate evaluation. Recommend making appt to see MD here in 4-6 weeks. In between visits, he needs to call or go to ER if bleeding or sickness.

## 2014-11-06 NOTE — Progress Notes (Signed)
hemacult positive. Checked with GI PA. Since hemoglobin trending up, recommended outpatient evaluation.  Hilbert Corrigan PA Pager: [phone]

## 2014-11-06 NOTE — Progress Notes (Signed)
I sent Joseph [name], [place] a text page informing him of patient being occult positive

## 2014-11-06 NOTE — Consult Note (Signed)
ELECTROPHYSIOLOGY CONSULT NOTE    Patient ID: Joseph [name] MRN: [id number], DOB/AGE: May 27, 1945 69 y.o.  Admit date: 11/03/2014 Date of Consult: 11/06/2014   Primary Physician: Marden Noble, MD Primary Cardiologist: Dr. Ellyn Hack Electrophysiologist: Dr. Rayann Heman  Reason for Consultation: Ischemic CM, evaluate for possible ICD  HPI: 69 yo man with PMH of CAD s/p CABG '01 at [facility] (LIMA-LAD, SVG-D2, SVG-OM1, SVG-right PDA, SVG-right PL1) along with previous PCI with ischemic cardiomyopathy and chronic systolic heart failure last known EF 25-30% by 6/16 echo, stage IV CKD (has had dialysis previously), chronic thrombocytopenia, T2DM, prostate cancer who was seen in clinic 10/14/14 by Dr. Ellyn Hack.   He had a cardiac catheterization by Dr. Humphrey Rolls in [facility] 07/30/14 revealing all vein grafts occluded, (known dating to '09), patent LIMA to LAD, 70% ostial left main lesion, 70% ostial LCx, occluded RCA. He underwent PCI by Dr. Gwenlyn Found 08/07/14 for critical left main and ostial LCx at 90% both and had synergy DES to both.   He had some chest pain on 7/20 at cardiac rehab (palpitations, PACs, PAT and bardycardia but no VT on 48 hor holter) leading to observation and negative cardiac markers, elevated d-dimer and VQ performed low risk for PE and ranexa restarted. He had some chest pain during other cardiac rehab and had follow-up 8/5 with uptitration of hydralazine and ranolazine.  He presents this admissioin with left-sided chest pain with radiation to neck/jaw. He characterized the pain as ache with some pressure. He hasn't missed any doses of plavix or aspirin.   He was diagnosed this admission with NSTEMI though due to worsening renal function, no intervention is planned at least at this time, pending his renal function reconvery, potentially in the future.    EP is being called to evaluate this patient with ICM for possible ICD implant. This morning he states he is feeling pretty well, denies any  ongoing CP, no palpitations, he denies any dizzy spells, no history of near syncope or syncope.  Past Medical History  Diagnosis Date  . Hypertension   . Hypercholesterolemia   . Prostate cancer   . Hepatosplenomegaly 2015    fibrosis, no cirrhosis on 06/2013 liver biopsy  . Thrombocytopenia 2015  . Myocardial infarction 2001  . Depression with anxiety   . IDDM (insulin dependent diabetes mellitus)     INSULIN DEPENDENT  . GERD (gastroesophageal reflux disease)   . History of shingles   . Ischemic cardiomyopathy     a. echo 08/06/2014: EF 25-30%, multiple WMA, mod DD, PASP 49 mm Hg   . CKD (chronic kidney disease), stage IV     a. previously on dialysis; b. followed by Dr. Abigail Butts   . Coronary artery disease, occlusive     a. s/p 4v CABG 2001(LIMA-LAD, SVG-D2, SVG-OM1, SVG-right PDA, SVG-right PL1); b. cath 07/30/2014 vein grafts all down, patent LIMA to LAD, significant LM and ost LCx dx; c. cath 08/07/2014 Synergy DES 2.5 mm x 18 mm to dist LM and ost LCx, rec lifelong DAPT  . Coronary artery disease involving coronary bypass graft 08/2014    all vein grafts occluded. Patent LIMA-LAD -- s/p PCI ot LM-Cx;   [name] Chronic combined systolic and diastolic CHF, NYHA class 2     a. echo 08/2013: EF 40-45%, impaired relaxation, mild MR, LA mildly dilated,    . Gallstone pancreatitis 2015     Surgical History:  Past Surgical History  Procedure Laterality Date  . Cardiac surgery    .  Shoulder arthroscopy    . Penile prosthesis implant    . Myocardial infarction    . Cardiac catheterization    . Coronary angioplasty    . Coronary artery bypass graft  2001  . Colonoscopy    . Liver biopsy    . Av fistula placement Left 2015  . Cardiac catheterization N/A 07/30/2014    Procedure: Left Heart Cath and Cors/Grafts Angiography;  Surgeon: Dionisio David, MD;  Location: [place] [facility];  Service: Cardiovascular;  Laterality: N/A;  . Cardiac catheterization N/A 08/07/2014    Procedure: Coronary  Stent Intervention;  Surgeon: Lorretta Harp, MD;  Location: [place] [facility];  Service: Cardiovascular;  Laterality: N/A;     Prescriptions prior to admission  Medication Sig Dispense Refill Last Dose  . aspirin 81 MG tablet Take 81 mg by mouth daily.   11/03/2014 at Unknown time  . calcitRIOL (ROCALTROL) 0.25 MCG capsule Take 0.25 mcg by mouth daily.    11/03/2014 at Unknown time  . carvedilol (COREG) 6.25 MG tablet Take 6.25 mg by mouth 2 (two) times daily with a meal.   11/03/2014 at 1900  . clopidogrel (PLAVIX) 75 MG tablet Take 1 tablet (75 mg total) by mouth daily. 90 tablet 3 11/03/2014 at Unknown time  . ferrous sulfate 325 (65 FE) MG EC tablet Take 325 mg by mouth daily with breakfast.    11/03/2014 at Unknown time  . hydrALAZINE (APRESOLINE) 100 MG tablet Take 0.5 tablets (50 mg total) by mouth 3 (three) times daily. (Patient taking differently: Take 100 mg by mouth 2 (two) times daily. ) 90 tablet 3 11/03/2014 at Unknown time  . isosorbide mononitrate (IMDUR) 30 MG 24 hr tablet Take 30 mg by mouth at bedtime.  0 11/03/2014 at Unknown time  . isosorbide mononitrate (IMDUR) 60 MG 24 hr tablet Take 1.5 tablets (90 mg total) by mouth daily. (Patient taking differently: Take 60 mg by mouth daily. ) 45 tablet 6 11/03/2014 at Unknown time  . LANTUS SOLOSTAR 100 UNIT/ML Solostar Pen Inject 20 Units into the skin at bedtime.    11/03/2014 at Unknown time  . LYRICA 75 MG capsule Take 75 mg by mouth 2 (two) times daily.   0 11/03/2014 at Unknown time  . nitroGLYCERIN (NITROLINGUAL) 0.4 MG/SPRAY spray Place 1 spray under the tongue every 5 (five) minutes x 3 doses as needed for chest pain. Do not use together with sublingual nitro 12 g 5 11/03/2014 at Unknown time  . NITROSTAT 0.4 MG SL tablet place 1 tablet under the tongue if needed every 5 minutes for che...  (REFER TO PRESCRIPTION NOTES).  0 unknown  . ranolazine (RANEXA) 1000 MG SR tablet Take 1 tablet (1,000 mg total) by mouth 2 (two) times daily. 60  tablet 6 11/03/2014 at Unknown time  . rosuvastatin (CRESTOR) 20 MG tablet Take 20 mg by mouth daily.   11/03/2014 at Unknown time  . sodium bicarbonate 650 MG tablet Take 650 mg by mouth 2 (two) times daily.    11/03/2014 at Unknown time  . sodium polystyrene (KAYEXALATE) 15 GM/60ML suspension Take 15 g by mouth 3 (three) times a week. Mon / Wed / Fri   11/02/2014 at Unknown time  . torsemide (DEMADEX) 20 MG tablet Take 1 tablet (20 mg total) by mouth daily. (Patient taking differently: Take 20 mg by mouth daily. Another 20 mg if needed for weight gain) 30 tablet 11 11/03/2014 at Unknown time  . carvedilol (COREG) 12.5  MG tablet Take 0.5 tablets (6.25 mg total) by mouth 2 (two) times daily with a meal. (Patient not taking: Reported on 11/03/2014) 60 tablet 2 Not Taking at Unknown time    Inpatient Medications:  . aspirin EC  81 mg Oral Daily  . calcitRIOL  0.25 mcg Oral Daily  . carvedilol  6.25 mg Oral BID WC  . clopidogrel  75 mg Oral Daily  . docusate sodium  100 mg Oral BID  . ferrous sulfate  325 mg Oral Q breakfast  . ferumoxytol  510 mg Intravenous Once  . furosemide  120 mg Oral BID  . hydrALAZINE  50 mg Oral BID  . insulin aspart  0-24 Units Subcutaneous TID WC  . insulin glargine  20 Units Subcutaneous QHS  . isosorbide mononitrate  60 mg Oral BID  . potassium chloride  20 mEq Oral Once  . pregabalin  75 mg Oral BID  . ranolazine  1,000 mg Oral BID  . rosuvastatin  20 mg Oral Daily  . sodium bicarbonate  1,300 mg Oral BID  . sodium chloride  3 mL Intravenous Q12H    Allergies:  Allergies  Allergen Reactions  . Sulfa Antibiotics Itching    hives  . Morphine And Related     Makes patient feel weird    Social History   Social History  . Marital Status: Legally Separated    Spouse Name: N/A  . Number of Children: N/A  . Years of Education: N/A   Occupational History  . Not on file.   Social History Main Topics  . Smoking status: Former Smoker    Quit date:  02/07/2008  . Smokeless tobacco: Former Systems developer  . Alcohol Use: No  . Drug Use: No  . Sexual Activity: Not on file   Other Topics Concern  . Not on file   Social History Narrative     Family History  Problem Relation Age of Onset  . Acute myelogenous leukemia Brother   . CAD Brother   . Diabetes Mellitus II Brother   . Heart disease Father 15    CABG     Review of Systems: General: No chills, fever, night sweats or weight changes  Cardiovascular:  No chest pain, dyspnea on exertion, edema, orthopnea, palpitations, paroxysmal nocturnal dyspnea Dermatological: No rash, lesions or masses Respiratory: No cough, dyspnea Urologic: No hematuria, dysuria Abdominal: No nausea, vomiting, diarrhea, bright red blood per rectum, melena, or hematemesis Neurologic: No visual changes, weakness, changes in mental status All other systems reviewed and are otherwise negative except as noted above.  Physical Exam: Filed Vitals:   11/05/14 2000 11/05/14 2340 11/06/14 0428 11/06/14 0735  BP: 140/69 103/47 134/83 126/82  Pulse: 65 61 67 59  Temp: 97.8 F (36.6 C) 98.1 F (36.7 C) 97.8 F (36.6 C)   TempSrc: Oral Oral Oral   Resp: 20 20 20    Height:      Weight:   213 lb 11.2 oz (96.934 kg)   SpO2: 96% 98% 97% 92%    GEN- The patient is well nourished, alert and oriented x 3 today.   HEENT: normocephalic, atraumatic; sclera clear, conjunctiva pink; hearing intact; oropharynx clear; neck supple, no JVP Lymph- no cervical lymphadenopathy Lungs- Clear to ausculation bilaterally, normal work of breathing.  No wheezes, rales, rhonchi Heart- Regular rate and rhythm, no murmurs, rubs or gallops GI- soft, non-tender, non-distended, bowel sounds present Extremities- no clubbing, cyanosis, or edema; DP/PT/radial pulses 2+ bilaterally MS- no significant deformity  or atrophy Skin- warm and dry, no rash or lesion Psych- euthymic mood, full affect Neuro- no gross deficits observed  Labs:   Lab  Results  Component Value Date   WBC 5.1 11/06/2014   HGB 9.1* 11/06/2014   HCT 28.3* 11/06/2014   MCV 85.8 11/06/2014   PLT 80* 11/06/2014    Recent Labs Lab 11/06/14 0421  NA 140  K 3.2*  CL 103  CO2 26  BUN 68*  CREATININE 5.28*  CALCIUM 7.8*  GLUCOSE 133*      Radiology/Studies:  Dg Chest Portable 1 View 11/03/2014   CLINICAL DATA:  Chest pain and shortness of breath.  EXAM: PORTABLE CHEST 1 VIEW  COMPARISON:  Frontal and lateral views 08/2014  FINDINGS: There is assessed median sternotomy. Cardiomegaly is again seen, question increased versus secondary to portable technique. There is perivascular haziness, particularly in the lower lobes, concerning for pulmonary edema. No confluent airspace disease to suggest pneumonia. No large pleural effusion or pneumothorax.  IMPRESSION: Suspect pulmonary edema. Cardiomegaly, may be increased from prior versus accentuated by portable technique.   Electronically Signed   By: Jeb Levering M.D.   On: 11/03/2014 22:07   11/04/14: Echocardiogram Study Conclusions - Left ventricle: The cavity size was mildly dilated. There was mild concentric hypertrophy. Systolic function was moderately to severely reduced. The estimated ejection fraction was in the range of 30% to 35%. Moderate diffuse hypokinesis. Akinesis and scarring of the inferolateral and inferior myocardium; consistent with infarction in the distribution of the right coronary artery. Hypokinesis of the basal-midanterolateral myocardium. Doppler parameters are consistent with restrictive physiology, indicative of decreased left ventricular diastolic compliance and/or increased left atrial pressure. - Ventricular septum: Septal motion showed paradox. - Left atrium: The atrium was severely dilated. - Right ventricle: The cavity size was moderately dilated. Systolic function was moderately to severely reduced. - Right atrium: The atrium was mildly  dilated.  08/06/14 Echocardiogram Study Conclusions - Left ventricle: The cavity size was mildly dilated. Wall thickness was normal. Systolic function was severely reduced. The estimated ejection fraction was in the range of 25% to 30%. Akinesis and scarring; consistent with infarction in the distribution of the right coronary artery. Hypokinesis of the basal-midanterolateral myocardium. Mild hypokinesis of the apicalanteroseptal, anterior, inferoseptal, and apical myocardium. There was a reduced contribution of atrial contraction to ventricular filling, due to increased ventricular diastolic pressure or atrial contractile dysfunction. Features are consistent with a pseudonormal left ventricular filling pattern, with concomitant abnormal relaxation and increased filling pressure (grade 2 diastolic dysfunction). - Left atrium: The atrium was severely dilated. - Right ventricle: The cavity size was mildly dilated. Systolic function was moderately reduced. - Right atrium: The atrium was mildly dilated. - Pulmonary arteries: Systolic pressure was moderately increased. PA peak pressure: 49 mm Hg (S).  EKGs are SR inferior infarct undetermined age, T changes, PVCs TELEMETRY: SR, PVCs  Assessment and Plan:  1. NSTEMI, severe CAD     No ongoing angina     Continue with his primary cardiac team 2. Acute on chronic systolic CHF     ICM     Continue management with primary cardiology team and nephrology 3. Acute on chronic kidney disease     Stage IV-V     Nephrology is following 4. Anemia, thrombocytopenia     s/p transfusion this admit     c/w GI, renal, he f/u outpt with hematology 5. PVC's     No history of arrest, no palpitations, no history of  near syncope or syncope  Recommend up-titration of his Carvedilol to BP tolerance with BP recommendations/parameters as per nephrology, to have out patient f/u with EP pending recovery from this last NSTEMI, and  f/u echo/EF re-evaluation.    Signed, Tommye Standard, PA-C 11/06/2014 10:35 AM  I have seen, examined the patient, and reviewed the above assessment and plan.  On exam, RRR. Changes to above are made where necessary.  The patient is quit ill with multiple acute and ongoing medical issues.  His prognosis is currently poor.  He is not a candidate for ICD implantation at this time.  Would favor a conservative medicine approach.  Would continue to titrate coreg as BP allows for PVCs.  If he clinically improves, could reconsider electively in the office setting in 3-6 months.  Electrophysiology team to see as needed while here. Please call with questions.   Co Sign: Thompson Grayer, MD 11/06/2014 12:09 PM

## 2014-11-06 NOTE — Progress Notes (Signed)
Patient: Joseph [name] / Admit Date: 11/03/2014 / Date of Encounter: 11/06/2014, 7:57 AM   Subjective: Says he's actually feeling well - no CP or SOB. Walked yesterday quite a bit, including with cardiac rehab briskly, and no CP.   Objective: Telemetry: NSR with frequent PVCs, occasional ventricular trigeminy Physical Exam: Blood pressure 134/83, pulse 67, temperature 97.8 F (36.6 C), temperature source Oral, resp. rate 20, height 5\' 10"  (1.778 m), weight 213 lb 11.2 oz (96.934 kg), SpO2 97 %. General: Well developed, well nourished WM in no acute distress. Head: Normocephalic, atraumatic, sclera non-icteric, no xanthomas, nares are without discharge. Neck: Negative for carotid bruits. JVP not elevated. Lungs: Crackles at left lung base, otherwise no wheezes or rhonchi. Breathing is unlabored. Heart: RRR S1 S2 without murmurs, rubs, or gallops.  Abdomen: Soft, non-tender, non-distended with normoactive bowel sounds. No rebound/guarding. Extremities: No clubbing or cyanosis. No edema. Distal pedal pulses are 2+ and equal bilaterally. Neuro: Alert and oriented X 3. Moves all extremities spontaneously. Psych:  Responds to questions appropriately with a normal affect.   Intake/Output Summary (Last 24 hours) at 11/06/14 0757 Last data filed at 11/05/14 2000  Gross per 24 hour  Intake    240 ml  Output    250 ml  Net    -10 ml    Inpatient Medications:  . aspirin EC  81 mg Oral Daily  . calcitRIOL  0.25 mcg Oral Daily  . carvedilol  6.25 mg Oral BID WC  . clopidogrel  75 mg Oral Daily  . docusate sodium  100 mg Oral BID  . ferrous sulfate  325 mg Oral Q breakfast  . furosemide  80 mg Oral BID  . hydrALAZINE  50 mg Oral BID  . insulin aspart  0-24 Units Subcutaneous TID WC  . insulin glargine  20 Units Subcutaneous QHS  . isosorbide mononitrate  60 mg Oral BID  . pregabalin  75 mg Oral BID  . ranolazine  1,000 mg Oral BID  . rosuvastatin  20 mg Oral Daily  . sodium  bicarbonate  1,300 mg Oral BID  . sodium chloride  3 mL Intravenous Q12H   Infusions:  . nitroGLYCERIN Stopped (11/05/14 1800)    Labs:  Recent Labs  11/05/14 0655 11/06/14 0421  NA 140 140  K 3.5 3.2*  CL 107 103  CO2 21* 26  GLUCOSE 108* 133*  BUN 65* 68*  CREATININE 4.93* 5.28*  CALCIUM 8.0* 7.8*  PHOS  --  5.8*    Recent Labs  11/06/14 0421  ALBUMIN 2.8*    Recent Labs  11/03/14 2145  11/04/14 1900 11/05/14 0058  WBC 5.1  < > 4.5 4.2  NEUTROABS 4.1  --  3.6  --   HGB 8.7*  < > 8.8* 8.3*  HCT 27.7*  < > 27.3* 25.4*  MCV 86.6  < > 85.0 84.7  PLT 69*  < > 68* 67*  < > = values in this interval not displayed.  Recent Labs  11/04/14 1255 11/04/14 1900 11/05/14 0058 11/05/14 0655  TROPONINI 6.92* 5.89* 4.14* 2.94*   Invalid input(s): POCBNP  Recent Labs  11/04/14 0120  HGBA1C 6.5*     Radiology/Studies:  Dg Chest Portable 1 View  11/03/2014   CLINICAL DATA:  Chest pain and shortness of breath.  EXAM: PORTABLE CHEST 1 VIEW  COMPARISON:  Frontal and lateral views 08/2014  FINDINGS: There is assessed median sternotomy. Cardiomegaly is again seen, question increased versus secondary to portable  technique. There is perivascular haziness, particularly in the lower lobes, concerning for pulmonary edema. No confluent airspace disease to suggest pneumonia. No large pleural effusion or pneumothorax.  IMPRESSION: Suspect pulmonary edema. Cardiomegaly, may be increased from prior versus accentuated by portable technique.   Electronically Signed   By: Jeb Levering M.D.   On: 11/03/2014 22:07     Assessment and Plan  CAD (s/p CABG '01 at [facility] with LIMA-LAD, SVG-D2, SVG-OM1, SVG-right PDA, SVG-right PL1; prior POBA; cath 07/2014 showing all VG occluded known dating back to 2009, patent LIMA-LAD, s/p DES to critical LM and ostial Cx disease 08/2014), ischemic cardiomyopathy/chronic systolic heart failure last known EF 25-30% by 6/16 echo, stage IV CKD (on HD for  several months in 2015), chronic thrombocytopenia (back to 2009 which has been previously worked up with [facility] per daughter and is just being monitored at this point - twin brother had MM which transformed into AML), T2DM, prostate cancer who presented 11/03/14 with chest pain, acute on chronic systolic CHF, acute on chronic anemia, and AKI on CKD. F/u echo 11/04/14: mild LVH, EF 30-35% + WMA, severely dilated LA, moderately dilated RV with mod-severely reduced RV function, mildly dilated RA.  1. Chest pain/NSTEMI with peak troponin 6.9 - difficult to know if this was a primary NSTEMI or demand ischemia given significant underlying complex CAD in the setting of worsening CHF and anemia. Per notes, with worsening Cr and recent worsening anemia, plan for medical management at this time, deferring invasive evaluation for when these issues stabilize.  2. Acute on chronic systolic CHF - at this time, diuretics are being managed per renal. Also appreciate their recommendations on repletion of potassium. Patient is not on ACEI/ARB due to #3. Hydralazine decreased by renal to allow increased BP. Per d/w EP, has to wait 40 days anyway for ICD given MI this admission but they will be consulting regarding high burden of PVCs. Question whether or not suppression would improve his cardiomyopathy.  3. AKI on CKD stage IV-V - per renal, may be a component of hypoperfusion + CHF contributing to worsening renal function. I have asked nursing to hold Lasix dose this AM until they confirm with renal that this is to be continued, since Cr worsening and weight is now down.  4. Acute on chronic anemia with chronic thrombocytopenia as above - s/p transfusion this admission. No CBC today. Will order and make sure subsequent sets are being followed. GI saw no evidence of bleeding and recommended no further GI workup at this time. Has f/u with hematology in [place]. May also be partially related to renal disease.\  5. Frequent PVCs   - see above.  Signed, Melina Copa PA-C Pager: [phone]  I have seen, examined and evaluated the patient this Am along with Mrs.Dunn, PA.  After reviewing all the available data and chart,  I agree with her findings, examination as well as impression recommendations.  Unfortunately, despite him feeling much better symptomatically and walking without any symptoms, he has signs of worsening renal function.  Thankfully, his hemoglobin level is stabilized at 9.1. -He did require 1 unit of blood. He also has thrombocytopenia which is likely to be evaluated with his upcoming hematology evaluation in [place]. He is contracted that visit scheduled for sooner than later. (His twin brother, who pretty much had a very similar clinical track as Mr. Tall is had, eventually succumbed to leukemia. This is somewhat promoting him from a genetic standpoint)  Most concerning factor now is  the worsening renal function. His heart failure seems to be stable and he is almost euvolemic. -- It looks like he may be reaching end-stage renal disease where he will need to go on dialysis. I had a long talk with him about this and apparently nephrologist Mauricio today to discuss that as well.  He does have a nephrologist in [place], and likely need to establish a plan for potentially starting dialysis. Since he is still making urine and is not acidotic or hyperkalemic, think were fine to not do dialysis at this point.  According to our clinic scales, his dry weight in clinic was about 218 although that may have still been a little higher than I would like her diabetes currently 213 pounds which is pretty much good down stopping point. I do think he will need to be on standing diuretic however.   He was seen by electrophysiology reference possible ICD. Concerning features for that are this acute hospitalization for heart failure ACS as well as now worsening renal function with consideration for dialysis. They would need to  wait at least 40 days after this event prior to determining if he was stable for ICD, however would then potentially being a dialysis candidate, ICD is less favorable unless they can do a subcutaneous ICD. -- Appreciate Dr. Olin Pia rapid consult -perhaps he can follow-up with Dr. Caryl Comes in our [facility] clinic  For now, we are having to back off on some of his blood pressure medications to allow for blood pressure to trend up to improve renal perfusion. He is on stable dose of beta blocker and hydralazine. On stable dose of Imdur and Ranexa.  He is on aspirin plus Plavix with no bleeding issues that are obviously seen despite his anemia.  At this point I think we need to monitor to see if his renal function plateaus prior to considering discharge. Once we know his renal function has stabilized, he should be ready for discharge from a cardiac standpoint. He will need close follow-up with his nephrologist as well as oncologist in addition to follow-up with me in the [place] office.   Leonie Man, M.D., M.S. Interventional Cardiologist   Pager # [phone]

## 2014-11-06 NOTE — Telephone Encounter (Signed)
Patient informed and appt cancelled for 10/4 he will come in for lab check before his trip to [facility] on the 12th for lab check

## 2014-11-06 NOTE — Care Management Important Message (Signed)
Important Message  Patient Details  Name: Joseph [name] MRN: [id number] Date of Birth: [birth year]   Medicare Important Message Given:  Yes-second notification given    [name] May 11/06/2014, 12:40 PM

## 2014-11-06 NOTE — Telephone Encounter (Signed)
In hospital at [facility] and his cardiologist said he needs an appt with Dr Ma Hillock ASAP due to his plt and hgb being low. Has a lab appt 11/24/14, but md appt not until February 2017. Appt given for Tuesday 10/4 as he is to be discharged form hospital on Monday. Do you want any labs drawn?

## 2014-11-06 NOTE — Progress Notes (Signed)
[facility] Progress Note    Assessment/ Plan:   1. Likely Progression of CKD: Patient with history of CKD Stage IV, but currently CKD Stage V. He likely has progression of his CKD with a component of hypoperfusion related to his heart failure. BPs a little higher as wanted in 315Q-008Q systolic after decreasing hydralazine. UOP only 250 ml recorded over past 24 hours after switching to PO Lasix. Will increase Lasix to 120 mg PO BID since he is still volume overloaded. UA only significant for proteinuria likely related to his long-standing diabetes. He will likely need to start dialysis in the near future. He will need follow up with his nephrologist, Dr. Abigail Butts in [place] once discharged to discuss this further.   2. Metabolic Acidosis: Bicarb increased from 21 to 26 after increasing sodium bicarbonate to 1,300 mg BID. Continue this dose. He should not need Kayexalate. 3. Hypokalemia: K 3.2 this AM. Will give K-Dur 20 mEq. Recheck in AM.  4. Anemia of chronic renal disease: Iron studies patient is iron deficient with iron sat 14% which is common in CKD patients. Will give him feraheme 510 mg x 1. Can give him another dose 3-5 days later to boost his iron stores. Continue ferrous sulfate 325 mg daily.  5. CKD-MBD: PTH level pending. Want to aim for PTH less than 100. Ph mildly elevated at 5.8. Continue calcitriol 0.25 mcg daily. Will need follow up with his nephrologist.  6. Acute on Chronic CHF/CAD: Weight down another 3 lbs today. He seems still significantly fluid overloaded on exam. Will increase Lasix as above. Management per Cardiology.  7. HTN: BP in 761P-509T systolic. Would like BPs to be a little higher so as to maximize renal perfusion. Continue lower Hydralazine dose.  8. Type 2 DM: Last HbA1c 6.5 on 9/28. CBGs in 100s-180s in hospital. He takes Lantus 20 units QHS at home. Currently is on home dose Lantus with novolog sliding scale. Management per primary team.    We  will sign off at this time. Please contact us if we can help in any way.   Subjective:   No acute events overnight. Patient states he walked the halls several times yesterday without dyspnea. UOP less this AM compared to yesterday.    Objective:   BP 134/83 mmHg  Pulse 67  Temp(Src) 97.8 F (36.6 C) (Oral)  Resp 20  Ht 5\' 10"  (1.778 m)  Wt 213 lb 11.2 oz (96.934 kg)  BMI 30.66 kg/m2  SpO2 97%  Intake/Output Summary (Last 24 hours) at 11/06/14 0801 Last data filed at 11/05/14 2000  Gross per 24 hour  Intake    240 ml  Output    250 ml  Net    -10 ml   Weight change: -2 lb 12.8 oz (-1.27 kg)  Physical Exam: Gen: alert, sitting up in bed, NAD CVS: RRR, systolic murmur heard best at LUSB Resp: crackles appreciated on left side Abd: BS+, soft, non-tender Ext: warm, 1-2+ pitting edema in lower extremities   Imaging: No results found.  Labs: BMET  Recent Labs Lab 11/03/14 2145 11/04/14 0536 11/05/14 0655 11/06/14 0421  NA 139 140 140 140  K 3.8 3.3* 3.5 3.2*  CL 105 108 107 103  CO2 23 22 21* 26  GLUCOSE 263* 158* 108* 133*  BUN 60* 62* 65* 68*  CREATININE 4.73* 4.63* 4.93* 5.28*  CALCIUM 7.9* 7.9* 8.0* 7.8*  PHOS  --   --   --  5.8*   CBC  Recent Labs Lab 11/03/14 2145 11/04/14 0536 11/04/14 1900 11/05/14 0058  WBC 5.1 4.2 4.5 4.2  NEUTROABS 4.1  --  3.6  --   HGB 8.7* 7.8* 8.8* 8.3*  HCT 27.7* 24.6* 27.3* 25.4*  MCV 86.6 86.0 85.0 84.7  PLT 69* 67* 68* 67*    Medications:    . aspirin EC  81 mg Oral Daily  . calcitRIOL  0.25 mcg Oral Daily  . carvedilol  6.25 mg Oral BID WC  . clopidogrel  75 mg Oral Daily  . docusate sodium  100 mg Oral BID  . ferrous sulfate  325 mg Oral Q breakfast  . ferumoxytol  510 mg Intravenous Once  . furosemide  80 mg Oral BID  . hydrALAZINE  50 mg Oral BID  . insulin aspart  0-24 Units Subcutaneous TID WC  . insulin glargine  20 Units Subcutaneous QHS  . isosorbide mononitrate  60 mg Oral BID  . pregabalin  75  mg Oral BID  . ranolazine  1,000 mg Oral BID  . rosuvastatin  20 mg Oral Daily  . sodium bicarbonate  1,300 mg Oral BID  . sodium chloride  3 mL Intravenous Q12H      Albin Felling, MD, MPH Internal Medicine Resident, PGY-II Pager: [phone]   11/06/2014, 8:01 AM

## 2014-11-07 LAB — CBC
HCT: 25.8 % — ABNORMAL LOW (ref 39.0–52.0)
Hemoglobin: 8.4 g/dL — ABNORMAL LOW (ref 13.0–17.0)
MCH: 27.9 pg (ref 26.0–34.0)
MCHC: 32.6 g/dL (ref 30.0–36.0)
MCV: 85.7 fL (ref 78.0–100.0)
PLATELETS: 61 10*3/uL — AB (ref 150–400)
RBC: 3.01 MIL/uL — AB (ref 4.22–5.81)
RDW: 16.4 % — ABNORMAL HIGH (ref 11.5–15.5)
WBC: 4 10*3/uL (ref 4.0–10.5)

## 2014-11-07 LAB — BASIC METABOLIC PANEL
ANION GAP: 11 (ref 5–15)
BUN: 70 mg/dL — ABNORMAL HIGH (ref 6–20)
CO2: 26 mmol/L (ref 22–32)
Calcium: 8 mg/dL — ABNORMAL LOW (ref 8.9–10.3)
Chloride: 101 mmol/L (ref 101–111)
Creatinine, Ser: 5.41 mg/dL — ABNORMAL HIGH (ref 0.61–1.24)
GFR calc Af Amer: 12 mL/min — ABNORMAL LOW (ref >60)
GFR, EST NON AFRICAN AMERICAN: 10 mL/min — AB (ref >60)
GLUCOSE: 133 mg/dL — AB (ref 65–99)
POTASSIUM: 3.6 mmol/L (ref 3.5–5.1)
SODIUM: 138 mmol/L (ref 135–145)

## 2014-11-07 LAB — GLUCOSE, CAPILLARY
GLUCOSE-CAPILLARY: 132 mg/dL — AB (ref 65–99)
GLUCOSE-CAPILLARY: 164 mg/dL — AB (ref 65–99)
GLUCOSE-CAPILLARY: 180 mg/dL — AB (ref 65–99)
GLUCOSE-CAPILLARY: 191 mg/dL — AB (ref 65–99)

## 2014-11-07 LAB — PTH, INTACT AND CALCIUM
CALCIUM TOTAL (PTH): 7.5 mg/dL — AB (ref 8.6–10.2)
PTH: 187 pg/mL — AB (ref 15–65)

## 2014-11-07 NOTE — Progress Notes (Signed)
Patient: Joseph [name] / Admit Date: 11/03/2014 / Date of Encounter: 11/07/2014, 10:40 AM   Subjective: Says he's actually feeling well - no CP or SOB. Walked the halls today without issue.  Objective: Telemetry: NSR with frequent PVCs, occasional ventricular trigeminy Physical Exam: Blood pressure 151/56, pulse 71, temperature 98.8 F (37.1 C), temperature source Oral, resp. rate 18, height 5\' 10"  (1.778 m), weight 211 lb 12.8 oz (96.072 kg), SpO2 93 %. General: Well developed, well nourished WM in no acute distress. Head: Normocephalic, atraumatic, sclera non-icteric, no xanthomas, nares are without discharge. Neck: Negative for carotid bruits. JVP not elevated. Lungs: no wheezes or rhonchi. Breathing is unlabored. Heart: RRR S1 S2 without murmurs, rubs, or gallops.  Abdomen: Soft, non-tender, non-distended with normoactive bowel sounds. No rebound/guarding. Extremities: No clubbing or cyanosis. No edema. Distal pedal pulses are 2+ and equal bilaterally. Neuro: Alert and oriented X 3. Moves all extremities spontaneously. Psych:  Responds to questions appropriately with a normal affect.   Intake/Output Summary (Last 24 hours) at 11/07/14 1040 Last data filed at 11/07/14 0828  Gross per 24 hour  Intake    603 ml  Output    650 ml  Net    -47 ml    Inpatient Medications:  . aspirin EC  81 mg Oral Daily  . calcitRIOL  0.25 mcg Oral Daily  . carvedilol  6.25 mg Oral BID WC  . clopidogrel  75 mg Oral Daily  . docusate sodium  100 mg Oral BID  . ferrous sulfate  325 mg Oral Q breakfast  . furosemide  120 mg Oral BID  . hydrALAZINE  50 mg Oral BID  . insulin aspart  0-24 Units Subcutaneous TID WC  . insulin glargine  20 Units Subcutaneous QHS  . isosorbide mononitrate  60 mg Oral BID  . pregabalin  75 mg Oral BID  . ranolazine  1,000 mg Oral BID  . rosuvastatin  20 mg Oral Daily  . sodium bicarbonate  1,300 mg Oral BID  . sodium chloride  3 mL Intravenous Q12H   Infusions:    . nitroGLYCERIN Stopped (11/05/14 1800)    Labs:  Recent Labs  11/06/14 0421 11/07/14 0540  NA 140 138  K 3.2* 3.6  CL 103 101  CO2 26 26  GLUCOSE 133* 133*  BUN 68* 70*  CREATININE 5.28* 5.41*  CALCIUM 7.8*  7.5* 8.0*  PHOS 5.8*  --     Recent Labs  11/06/14 0421  ALBUMIN 2.8*    Recent Labs  11/04/14 1900  11/06/14 0921 11/07/14 0540  WBC 4.5  < > 5.1 4.0  NEUTROABS 3.6  --   --   --   HGB 8.8*  < > 9.1* 8.4*  HCT 27.3*  < > 28.3* 25.8*  MCV 85.0  < > 85.8 85.7  PLT 68*  < > 80* 61*  < > = values in this interval not displayed.  Recent Labs  11/04/14 1255 11/04/14 1900 11/05/14 0058 11/05/14 0655  TROPONINI 6.92* 5.89* 4.14* 2.94*   Invalid input(s): POCBNP No results for input(s): HGBA1C in the last 72 hours.   Radiology/Studies:  Dg Chest Portable 1 View  11/03/2014   CLINICAL DATA:  Chest pain and shortness of breath.  EXAM: PORTABLE CHEST 1 VIEW  COMPARISON:  Frontal and lateral views 08/2014  FINDINGS: There is assessed median sternotomy. Cardiomegaly is again seen, question increased versus secondary to portable technique. There is perivascular haziness, particularly in the lower lobes,  concerning for pulmonary edema. No confluent airspace disease to suggest pneumonia. No large pleural effusion or pneumothorax.  IMPRESSION: Suspect pulmonary edema. Cardiomegaly, may be increased from prior versus accentuated by portable technique.   Electronically Signed   By: Jeb Levering M.D.   On: 11/03/2014 22:07     Assessment and Plan  CAD (s/p CABG '01 at [facility] with LIMA-LAD, SVG-D2, SVG-OM1, SVG-right PDA, SVG-right PL1; prior POBA; cath 07/2014 showing all VG occluded known dating back to 2009, patent LIMA-LAD, s/p DES to critical LM and ostial Cx disease 08/2014), ischemic cardiomyopathy/chronic systolic heart failure last known EF 25-30% by 6/16 echo, stage IV CKD (on HD for several months in 2015), chronic thrombocytopenia (back to 2009 which has been  previously worked up with BMB per daughter and is just being monitored at this point - twin brother had MM which transformed into AML), T2DM, prostate cancer who presented 11/03/14 with chest pain, acute on chronic systolic CHF, acute on chronic anemia, and AKI on CKD. F/u echo 11/04/14: mild LVH, EF 30-35% + WMA, severely dilated LA, moderately dilated RV with mod-severely reduced RV function, mildly dilated RA.  1. Chest pain/NSTEMI with peak troponin 6.9 - difficult to know if this was a primary NSTEMI or demand ischemia given significant underlying complex CAD in the setting of worsening CHF and anemia. Per notes, with worsening Cr and recent worsening anemia, plan for medical management at this time, deferring invasive evaluation for when these issues stabilize.  2. Acute on chronic systolic CHF - at this time, diuretics are being managed per renal. Also appreciate their recommendations on repletion of potassium. Patient is not on ACEI/ARB due to #3. Hydralazine decreased by renal to allow increased BP. Per d/w EP, has to wait 40 days anyway for ICD given MI this admission but they Deserea Bordley be consulting regarding high burden of PVCs.  Likely not a cause of myopathy unless greater than 20% of beats.  3. AKI on CKD stage IV-V - per renal, may be a component of hypoperfusion + CHF contributing to worsening renal function. I have asked nursing to hold Lasix dose this AM until they confirm with renal that this is to be continued, since Cr worsening and weight is now down.  Appreciate renal recs.  4. Acute on chronic anemia with chronic thrombocytopenia as above - s/p transfusion this admission.  Danialle Dement order and make sure subsequent sets are being followed. GI saw no evidence of bleeding and recommended no further GI workup at this time. Has f/u with hematology in [place]. May also be partially related to renal disease.  Talasia Saulter plan for transfusion if necessary tomorrow.  5. Frequent PVCs  - see  above.  Signed, Allegra Lai, MD 11/07/2014 10:43 AM

## 2014-11-07 NOTE — Progress Notes (Signed)
Called Nephrology Justin Mend, MD to clarify if he wants pt to have am dose of Lasix. MD verbal order to give am dose.

## 2014-11-08 LAB — BASIC METABOLIC PANEL
Anion gap: 12 (ref 5–15)
BUN: 75 mg/dL — AB (ref 6–20)
CALCIUM: 8 mg/dL — AB (ref 8.9–10.3)
CO2: 26 mmol/L (ref 22–32)
CREATININE: 5.56 mg/dL — AB (ref 0.61–1.24)
Chloride: 99 mmol/L — ABNORMAL LOW (ref 101–111)
GFR calc Af Amer: 11 mL/min — ABNORMAL LOW (ref >60)
GFR calc non Af Amer: 10 mL/min — ABNORMAL LOW (ref >60)
GLUCOSE: 122 mg/dL — AB (ref 65–99)
Potassium: 3.1 mmol/L — ABNORMAL LOW (ref 3.5–5.1)
Sodium: 137 mmol/L (ref 135–145)

## 2014-11-08 LAB — GLUCOSE, CAPILLARY
Glucose-Capillary: 99 mg/dL (ref 65–99)
Glucose-Capillary: 194 mg/dL — ABNORMAL HIGH (ref 65–99)

## 2014-11-08 LAB — CBC
HEMATOCRIT: 26.3 % — AB (ref 39.0–52.0)
Hemoglobin: 8.6 g/dL — ABNORMAL LOW (ref 13.0–17.0)
MCH: 28.1 pg (ref 26.0–34.0)
MCHC: 32.7 g/dL (ref 30.0–36.0)
MCV: 85.9 fL (ref 78.0–100.0)
PLATELETS: 68 10*3/uL — AB (ref 150–400)
RBC: 3.06 MIL/uL — ABNORMAL LOW (ref 4.22–5.81)
RDW: 16.3 % — AB (ref 11.5–15.5)
WBC: 4.4 10*3/uL (ref 4.0–10.5)

## 2014-11-08 MED ORDER — SODIUM BICARBONATE 650 MG PO TABS: 1300.0000 mg | ORAL_TABLET | Freq: Two times a day (BID) | ORAL | Status: DC

## 2014-11-08 MED ORDER — FUROSEMIDE 40 MG PO TABS: 120.0000 mg | ORAL_TABLET | Freq: Two times a day (BID) | ORAL | Status: DC

## 2014-11-08 MED ORDER — ISOSORBIDE MONONITRATE ER 60 MG PO TB24: 60.0000 mg | ORAL_TABLET | Freq: Two times a day (BID) | ORAL | Status: DC

## 2014-11-08 MED ORDER — HYDRALAZINE HCL 50 MG PO TABS: 50.0000 mg | ORAL_TABLET | Freq: Two times a day (BID) | ORAL | Status: DC

## 2014-11-08 NOTE — Progress Notes (Signed)
Patient: Joseph [name] / Admit Date: 11/03/2014 / Date of Encounter: 11/08/2014, 7:03 AM   Subjective: Says he's actually feeling well - no CP or SOB. Anxious for possible discharge.  Objective: Telemetry: NSR with frequent PVCs, occasional ventricular trigeminy Physical Exam: Blood pressure 126/62, pulse 65, temperature 98.2 F (36.8 C), temperature source Oral, resp. rate 20, height 5\' 10"  (1.778 m), weight 208 lb 6.4 oz (94.53 kg), SpO2 97 %. General: Well developed, well nourished WM in no acute distress. Head: Normocephalic, atraumatic, sclera non-icteric, no xanthomas, nares are without discharge. Neck: Negative for carotid bruits. JVP not elevated. Lungs: no wheezes or rhonchi. Breathing is unlabored. Heart: RRR S1 S2 without murmurs, rubs, or gallops.  Abdomen: Soft, non-tender, non-distended with normoactive bowel sounds. No rebound/guarding. Extremities: No clubbing or cyanosis. No edema. Distal pedal pulses are 2+ and equal bilaterally. Neuro: Alert and oriented X 3. Moves all extremities spontaneously. Psych:  Responds to questions appropriately with a normal affect.   Intake/Output Summary (Last 24 hours) at 11/08/14 0703 Last data filed at 11/08/14 0419  Gross per 24 hour  Intake    600 ml  Output   3765 ml  Net  -3165 ml    Inpatient Medications:  . aspirin EC  81 mg Oral Daily  . calcitRIOL  0.25 mcg Oral Daily  . carvedilol  6.25 mg Oral BID WC  . clopidogrel  75 mg Oral Daily  . docusate sodium  100 mg Oral BID  . ferrous sulfate  325 mg Oral Q breakfast  . furosemide  120 mg Oral BID  . hydrALAZINE  50 mg Oral BID  . insulin aspart  0-24 Units Subcutaneous TID WC  . insulin glargine  20 Units Subcutaneous QHS  . isosorbide mononitrate  60 mg Oral BID  . pregabalin  75 mg Oral BID  . ranolazine  1,000 mg Oral BID  . rosuvastatin  20 mg Oral Daily  . sodium bicarbonate  1,300 mg Oral BID  . sodium chloride  3 mL Intravenous Q12H   Infusions:  .  nitroGLYCERIN Stopped (11/05/14 1800)    Labs:  Recent Labs  11/06/14 0421 11/07/14 0540 11/08/14 0306  NA 140 138 137  K 3.2* 3.6 3.1*  CL 103 101 99*  CO2 26 26 26   GLUCOSE 133* 133* 122*  BUN 68* 70* 75*  CREATININE 5.28* 5.41* 5.56*  CALCIUM 7.8*  7.5* 8.0* 8.0*  PHOS 5.8*  --   --     Recent Labs  11/06/14 0421  ALBUMIN 2.8*    Recent Labs  11/07/14 0540 11/08/14 0306  WBC 4.0 4.4  HGB 8.4* 8.6*  HCT 25.8* 26.3*  MCV 85.7 85.9  PLT 61* 68*   No results for input(s): CKTOTAL, CKMB, TROPONINI in the last 72 hours. Invalid input(s): POCBNP No results for input(s): HGBA1C in the last 72 hours.   Radiology/Studies:  Dg Chest Portable 1 View  11/03/2014   CLINICAL DATA:  Chest pain and shortness of breath.  EXAM: PORTABLE CHEST 1 VIEW  COMPARISON:  Frontal and lateral views 08/2014  FINDINGS: There is assessed median sternotomy. Cardiomegaly is again seen, question increased versus secondary to portable technique. There is perivascular haziness, particularly in the lower lobes, concerning for pulmonary edema. No confluent airspace disease to suggest pneumonia. No large pleural effusion or pneumothorax.  IMPRESSION: Suspect pulmonary edema. Cardiomegaly, may be increased from prior versus accentuated by portable technique.   Electronically Signed   By: Fonnie Birkenhead.D.  On: 11/03/2014 22:07     Assessment and Plan  CAD (s/p CABG '01 at [facility] with LIMA-LAD, SVG-D2, SVG-OM1, SVG-right PDA, SVG-right PL1; prior POBA; cath 07/2014 showing all VG occluded known dating back to 2009, patent LIMA-LAD, s/p DES to critical LM and ostial Cx disease 08/2014), ischemic cardiomyopathy/chronic systolic heart failure last known EF 25-30% by 6/16 echo, stage IV CKD (on HD for several months in 2015), chronic thrombocytopenia (back to 2009 which has been previously worked up with BMB per daughter and is just being monitored at this point - twin brother had MM which transformed into  AML), T2DM, prostate cancer who presented 11/03/14 with chest pain, acute on chronic systolic CHF, acute on chronic anemia, and AKI on CKD. F/u echo 11/04/14: mild LVH, EF 30-35% + WMA, severely dilated LA, moderately dilated RV with mod-severely reduced RV function, mildly dilated RA.  1. Chest pain/NSTEMI with peak troponin 6.9 - difficult to know if this was a primary NSTEMI or demand ischemia given significant underlying complex CAD in the setting of worsening CHF and anemia. Per notes, with worsening Cr and recent worsening anemia, plan for medical management at this time, deferring invasive evaluation for when these issues stabilize.  2. Acute on chronic systolic CHF - at this time, diuretics are being managed per renal. Also appreciate their recommendations on repletion of potassium. Patient is not on ACEI/ARB due to #3. Hydralazine decreased by renal to allow increased BP. Per d/w EP, has to wait 40 days anyway for ICD given MI this admission but they Joye Wesenberg be consulting regarding high burden of PVCs.  Likely not a cause of myopathy unless greater than 20% of beats.  3. AKI on CKD stage IV-V - per renal, may be a component of hypoperfusion + CHF contributing to worsening renal function. Renal is dosing lasix currently.  Chrysa Rampy discuss outpatient dosing with them.  Possible discharge today.  4. Acute on chronic anemia with chronic thrombocytopenia as above - s/p transfusion this admission.  Melvina Pangelinan order and make sure subsequent sets are being followed. GI saw no evidence of bleeding and recommended no further GI workup at this time. Has f/u with hematology in [place]. May also be partially related to renal disease.  Ayeisha Lindenberger plan for transfusion if necessary tomorrow.  5. Frequent PVCs  - see above.  Signed, Allegra Lai, MD 11/08/2014 7:03 AM

## 2014-11-08 NOTE — Discharge Summary (Signed)
Physician Discharge Summary    Cardiologist: Ellyn Hack  Patient ID: EMAD BRECHTEL MRN: [id number] DOB/AGE: 1945-08-23 69 y.o.  Admit date: 11/03/2014 Discharge date: 11/08/2014  Admission Diagnoses:  Chest pain  Discharge Diagnoses:  Active Problems:   Hx of CABG 2001   Type 2 diabetes mellitus with renal manifestations (HCC)   Anemia   Thrombocytopenia (HCC)   Acute on chronic systolic CHF (congestive heart failure) (HCC)   NSTEMI (non-ST elevated myocardial infarction) ([place])   Ischemic cardiomyopathy   Essential hypertension   CAD s/p CABG 2001, prior POBA, PCI/DES July 2016   Acute renal failure superimposed on stage 4 chronic kidney disease (HCC)   Frequent PVCs   Discharged Condition: stable  Hospital Course:   Mr. Mcphearson is a 69 yo man with PMH of CAD s/p CABG '01 at [facility] (LIMA-LAD, SVG-D2, SVG-OM1, SVG-right PDA, SVG-right PL1) along with previous PCI with ischemic cardiomyopathy and chronic systolic heart failure last known EF 25-30% by 6/16 echo, stage IV CKD (has had dialysis previously), chronic thrombocytopenia, T2DM, prostate cancer who was seen in clinic 10/14/14 by Dr. Ellyn Hack.   He had a cardiac catheterization by Dr. Humphrey Rolls in [facility] 07/30/14 revealing all vein grafts occluded, (known dating to '09), patent LIMA to LAD, 70% ostial left main lesion, 70% ostial LCx, occluded RCA. He underwent PCI by Dr. Gwenlyn Found 08/07/14 for critical left main and ostial LCx at 90% and had synergy DES to both. He had some chest pain on 7/20 at cardiac rehab (palpitations, PACs, PAT and bardycardia but no VT on 48 hor holter) leading to observation and negative cardiac markers, elevated d-dimer and VQ performed low risk for PE and ranexa restarted. He had some chest pain during other cardiac rehab and had follow-up 8/5 with uptitration of hydralazine and ranolazine.   He presented with left-sided chest pain with radiation to neck/jaw. He characterized the pain as ache with some pressure. He  is able to ride the bicycle at rehab without pain but walking around, up hill or getting on the treadmill brings on chest discomfort. He hasn't missed any doses of plavix or aspirin. He has noted swelling and dyspnea lying flat. In the ER his symptoms improved with IV NTG.   Patient was admitted to stepdown. We started on 80 mg of Lasix IV twice a day. IV nitroglycerin was continued as well as aspirin, Coreg, Plavix, Crestor, ranolazine.  Patient had an acute drop in hemoglobin reports 8.7 to 7.8 despite diuresing 1 L at the time.  He was transfused 1 unit of PRBCs. GI consult at and he was heme-negative at the time of exam, however, he ended having Hemoccult-positive stool. Hemoglobin was trending up, so they recommended outpatient evaluation.  The patient's troponin was trending up and peaked at 6.92. Is difficult to know if the patient had a primary NSTEMI or if it was demand ischemia. However, he did very well with cardiac rehabilitation and ambulated 2000 feet with no chest pain.  2-D echocardiogram revealed ejection fraction of 30-35% with moderate diffuse hypokinesis. There was akinesis scarring of the inferior lateral and inferior myocardium. Left atrium was severely dilated. Right ventricle was moderately dilated. Right atrium was mildly dilated.  Was also seen in consult by Dr. Caryl Comes with EP and was not considered a candidate for ICD implantation at this time but could reconsider in 3-6 months.  He was also seen by nephrology worsening chronic kidney disease who thought he likely had a component of hypoperfusion related to his heart  failure on top of his progressively worsening chronic kidney disease.  Dr. Jimmy Footman recommended keeping his blood pressure slightly higher than had been.  Bicarbonate was increased to 1300 mg twice a day.  His Lasix was later increased to 120 mg twice daily which would be his discharge dosing and was reviewed with Dr. Justin Mend. Kayexalate was not resumed at discharge. Potassium  has been on the low side during hospitalization.  He was weaned off IV nitroglycerin and Imdur was changed to 60 mg twice a day.  He ultimately diuresed 6 L.  He Jah Alarid need to follow-up with his nephrologist in [place] in one week and have labs. The patient was seen by Dr. Curt Bears who felt he was stable for DC home.  We Galdino Hinchman schedule transition of care follow-up within 7 days..      Consults: GI, nephrology, electrophysiology, cardiac rehabilitation  Significant Diagnostic Studies:  Echocardiogram Study Conclusions  - Left ventricle: The cavity size was mildly dilated. There was mild concentric hypertrophy. Systolic function was moderately to severely reduced. The estimated ejection fraction was in the range of 30% to 35%. Moderate diffuse hypokinesis. Akinesis and scarring of the inferolateral and inferior myocardium; consistent with infarction in the distribution of the right coronary artery. Hypokinesis of the basal-midanterolateral myocardium. Doppler parameters are consistent with restrictive physiology, indicative of decreased left ventricular diastolic compliance and/or increased left atrial pressure. - Ventricular septum: Septal motion showed paradox. - Left atrium: The atrium was severely dilated. - Right ventricle: The cavity size was moderately dilated. Systolic function was moderately to severely reduced. - Right atrium: The atrium was mildly dilated.  Impressions:  - Unable to upload previous images for direct comparison.  Treatments: see above  Discharge Exam: Blood pressure 138/65, pulse 63, temperature 98 F (36.7 C), temperature source Oral, resp. rate 17, height 5\' 10"  (1.778 m), weight 208 lb 6.4 oz (94.53 kg), SpO2 96 %.   Disposition: 01-Home or Self Care      Discharge Instructions    Diet - low sodium heart healthy    Complete by:  As directed      Discharge instructions    Complete by:  As directed   Monitor your weight every  morning.  If you gain 3 pounds in 24 hours, or 5 pounds in a week, call the office for instructions.     Increase activity slowly    Complete by:  As directed             Medication List    STOP taking these medications        sodium polystyrene 15 GM/60ML suspension  Commonly known as:  KAYEXALATE     torsemide 20 MG tablet  Commonly known as:  DEMADEX      TAKE these medications        aspirin 81 MG tablet  Take 81 mg by mouth daily.     calcitRIOL 0.25 MCG capsule  Commonly known as:  ROCALTROL  Take 0.25 mcg by mouth daily.     carvedilol 6.25 MG tablet  Commonly known as:  COREG  Take 6.25 mg by mouth 2 (two) times daily with a meal.     clopidogrel 75 MG tablet  Commonly known as:  PLAVIX  Take 1 tablet (75 mg total) by mouth daily.     ferrous sulfate 325 (65 FE) MG EC tablet  Take 325 mg by mouth daily with breakfast.     furosemide 40 MG tablet  Commonly known as:  LASIX  Take 3 tablets (120 mg total) by mouth 2 (two) times daily.     hydrALAZINE 50 MG tablet  Commonly known as:  APRESOLINE  Take 1 tablet (50 mg total) by mouth 2 (two) times daily.     isosorbide mononitrate 60 MG 24 hr tablet  Commonly known as:  IMDUR  Take 1 tablet (60 mg total) by mouth 2 (two) times daily.     LANTUS SOLOSTAR 100 UNIT/ML Solostar Pen  Generic drug:  Insulin Glargine  Inject 20 Units into the skin at bedtime.     LYRICA 75 MG capsule  Generic drug:  pregabalin  Take 75 mg by mouth 2 (two) times daily.     nitroGLYCERIN 0.4 MG/SPRAY spray  Commonly known as:  NITROLINGUAL  Place 1 spray under the tongue every 5 (five) minutes x 3 doses as needed for chest pain. Do not use together with sublingual nitro     ranolazine 1000 MG SR tablet  Commonly known as:  RANEXA  Take 1 tablet (1,000 mg total) by mouth 2 (two) times daily.     rosuvastatin 20 MG tablet  Commonly known as:  CRESTOR  Take 20 mg by mouth daily.     sodium bicarbonate 650 MG tablet  Take  2 tablets (1,300 mg total) by mouth 2 (two) times daily.       Follow-up Information    Follow up with Marden Noble, MD.   Specialty:  Internal Medicine   Why:  for general medical care.    Contact information:   [address] [place] [address] [phone]       Follow up with REIN, Grace Blight, MD.   Specialty:  Gastroenterology   Why:  for follow up of liver disease and ocult blood detected in stool.    Contact information:   [place] [address] [phone]       Follow up with Lavonia Dana, MD.   Specialty:  Internal Medicine   Why:  Please call to schedule follow-up for 1 week.   Contact information:   [place] [address] [phone]       Follow up with Leonie Man, MD.   Specialty:  Cardiology   Why:  the office Caspian Deleonardis call you with the appointment date and time. It Tyrika Newman likely be at our [facility] information:   [address] [place] [address] [phone]       Follow up with Leia Alf, MD On 11/10/2014.   Specialty:  Internal Medicine   Why:  call their office to find out what time your appointment is   Contact information:   [address] [place] [address] [phone]      Greater than 30 minutes was spent completing the patient's discharge.   SignedTarri Fuller, [place] 11/08/2014, 11:02 AM  I have seen and examined this patient with Tarri Fuller.  Agree with above, note added to reflect my findings.  On exam, regular rhythm, no murmurs.  Plan for follow up with nephrology for CKD.  Jyron Turman continue aspirin and plavix for CAD.  Follow up in cardiology clinic Jabarie Pop be arranged.  Green Quincy M. Beth Spackman MD 11/08/2014 11:16 AM

## 2014-11-09 ENCOUNTER — Encounter: Payer: Medicare Other | Attending: Cardiology | Admitting: *Deleted

## 2014-11-09 DIAGNOSIS — Z9861 Coronary angioplasty status: Secondary | ICD-10-CM

## 2014-11-09 DIAGNOSIS — I214 Non-ST elevation (NSTEMI) myocardial infarction: Secondary | ICD-10-CM | POA: Diagnosis present

## 2014-11-09 NOTE — Progress Notes (Signed)
Daily Session Note  Patient Details  Name: Joseph [name] MRN: [id number] Date of Birth: [birth year] Referring Provider:  Leonie Man, MD  Encounter Date: 11/09/2014  Check In:     Session Check In - 11/09/14 1012    Check-In   Staff Present Candiss Norse MS, ACSM CEP Exercise Physiologist;Kehinde Totzke Alfonso Patten, ACSM CEP Exercise Physiologist;Carroll Enterkin RN, BSN   ER physicians immediately available to respond to emergencies See telemetry face sheet for immediately available ER MD   Medication changes reported     No   Fall or balance concerns reported    No   Warm-up and Cool-down Performed on first and last piece of equipment   VAD Patient? No   Pain Assessment   Currently in Pain? No/denies   Multiple Pain Sites No           Exercise Prescription Changes - 11/09/14 1000    Exercise Review   Progression Yes   Response to Exercise   Symptoms Has angina with exercise, angina is relieved when exercise is stopped or slowed   Comments MD cleared him to resume playing pickleball and to exercise with angina   Frequency Add 1 additional day to program exercise sessions.   Duration Progress to 30 minutes of continuous aerobic without signs/symptoms of physical distress   Intensity Rest + 30   Progression Continue progressive overload as per policy without signs/symptoms or physical distress.   Resistance Training   Training Prescription Yes   Weight 6   Reps 10-15   Interval Training   Interval Training No   Treadmill   MPH 2.7   Grade 0   Minutes 20   NuStep   Level 6   Watts 60   Minutes 20   REL-XR   Level 6   Watts 65   Minutes 15      Goals Met:  Independence with exercise equipment Exercise tolerated well Personal goals reviewed No report of cardiac concerns or symptoms Strength training completed today  Goals Unmet:  Not Applicable  Goals Comments:    Dr. Emily Filbert is Medical Director for [facility].

## 2014-11-10 ENCOUNTER — Telehealth: Payer: Self-pay | Admitting: Cardiology

## 2014-11-10 ENCOUNTER — Ambulatory Visit: Payer: Medicare Other | Admitting: Internal Medicine

## 2014-11-10 NOTE — Telephone Encounter (Signed)
TCM per Bryan/staff message  10/7 @ 10:30 w/ Kathlene November in [place]

## 2014-11-11 ENCOUNTER — Other Ambulatory Visit: Payer: Self-pay

## 2014-11-11 DIAGNOSIS — I214 Non-ST elevation (NSTEMI) myocardial infarction: Secondary | ICD-10-CM

## 2014-11-11 NOTE — Progress Notes (Signed)
Daily Session Note  Patient Details  Name: Joseph [name] MRN: [id number] Date of Birth: 12/25/1945 Referring Provider:  Harding, David W, MD  Encounter Date: 11/11/2014  Check In:     Session Check In - 11/11/14 0824    Check-In   Staff Present Mary Jo Abernethy RN;Renee MacMillan MS, ACSM CEP Exercise Physiologist;  BS, ACSM EP-C, Exercise Physiologist   ER physicians immediately available to respond to emergencies See telemetry face sheet for immediately available ER MD   Medication changes reported     No   Fall or balance concerns reported    No   Warm-up and Cool-down Performed on first and last piece of equipment   VAD Patient? No   Pain Assessment   Currently in Pain? No/denies         Goals Met:  Proper associated with RPD/PD & O2 Sat Exercise tolerated well No report of cardiac concerns or symptoms Strength training completed today  Goals Unmet:  Not Applicable  Goals Comments:    Dr. Mark Miller is Medical Director for [facility] and [facility]. 

## 2014-11-13 ENCOUNTER — Encounter: Payer: Self-pay | Admitting: Physician Assistant

## 2014-11-13 ENCOUNTER — Ambulatory Visit (INDEPENDENT_AMBULATORY_CARE_PROVIDER_SITE_OTHER): Payer: Medicare Other | Admitting: Physician Assistant

## 2014-11-13 ENCOUNTER — Encounter: Payer: Medicare Other | Admitting: *Deleted

## 2014-11-13 VITALS — BP 100/46 | HR 52 | Ht 71.0 in | Wt 205.4 lb

## 2014-11-13 VITALS — Ht 70.0 in | Wt 203.1 lb

## 2014-11-13 DIAGNOSIS — I5023 Acute on chronic systolic (congestive) heart failure: Secondary | ICD-10-CM | POA: Diagnosis not present

## 2014-11-13 DIAGNOSIS — I214 Non-ST elevation (NSTEMI) myocardial infarction: Secondary | ICD-10-CM | POA: Diagnosis not present

## 2014-11-13 DIAGNOSIS — I5022 Chronic systolic (congestive) heart failure: Secondary | ICD-10-CM

## 2014-11-13 NOTE — Progress Notes (Signed)
Discharge Summary  Patient Details  Name: Joseph [name] MRN: [id number] Date of Birth: 12/04/1945 Referring Provider:  Leonie Man, MD   Number of Visits: 36  Reason for Discharge:  Patient reached a stable level of exercise. Patient independent in their exercise.  Smoking History:  History  Smoking status  . Former Smoker  . Quit date: 02/07/2008  Smokeless tobacco  . Former User    Diagnosis:  NSTEMI (non-ST elevated myocardial infarction) ([place])  ADL UCSD:   Initial Exercise Prescription:     Initial Exercise Prescription - 08/24/14 1400    Date of Initial Exercise Prescription   Date 08/24/14   Treadmill   MPH 2.3   Grade 0   Minutes 15   Bike   Level 0.8   Minutes 15   Recumbant Bike   Level 2   RPM 40   Watts 20   Minutes 15   NuStep   Level 3   Watts 30   Minutes 15   Arm Ergometer   Level 1   Watts 10   Minutes 15   Arm/Foot Ergometer   Level 1   Watts 12   Minutes 15   Cybex   Level 1   RPM 40   Minutes 15   Recumbant Elliptical   Level 1   RPM 40   Watts 20   Minutes 15   Elliptical   Level 1   Speed 3   Minutes 5   REL-XR   Level 3   Watts 30   Minutes 15   Prescription Details   Frequency (times per week) 3   Duration Progress to 30 minutes of continuous aerobic without signs/symptoms of physical distress   Intensity   THRR REST +  30   Ratings of Perceived Exertion 11-15   Progression Continue progressive overload as per policy without signs/symptoms or physical distress.   Resistance Training   Training Prescription Yes   Weight 2   Reps 10-12      Discharge Exercise Prescription (Final Exercise Prescription Changes):     Exercise Prescription Changes - 11/13/14 0800    Exercise Review   Progression Yes   Response to Exercise   Symptoms None   Comments MD cleared Tarun to play pickleball and since ER visit he has not been having any cardiac symptoms. He has felt good the past 2 days in rehab. He  improved by 15% on his post 6MW test.Patient is approaching graduation of the program and home exercise plans were discussed. Details of the patient's exercise prescription and what they need to do in order to continue the prescription and progress with exercise were outlined and the patient verbalized understanding. The patient plans to complete all exercise at [facility] and [facility] to play pickleball 4 days a week.    Frequency Add 1 additional day to program exercise sessions.   Duration Progress to 30 minutes of continuous aerobic without signs/symptoms of physical distress   Intensity Rest + 30   Progression Continue progressive overload as per policy without signs/symptoms or physical distress.   Resistance Training   Training Prescription Yes   Weight 6   Reps 10-15   Interval Training   Interval Training No   Treadmill   MPH 2.7   Grade 0   Minutes 20   NuStep   Level 6   Watts 60   Minutes 20   REL-XR   Level 6   Watts 65  Minutes 15   Home Exercise Plan   Plans to continue exercise at [facility] (comment)  [facility] (pickleball)      Functional Capacity:     6 Minute Walk      08/24/14 1448 11/13/14 0843     6 Minute Walk   Phase Initial Discharge    Distance 1265 feet 1450 feet    Distance % Change  15 %    Walk Time 6 minutes 6 minutes    Resting HR 59 bpm 56 bpm    Resting BP 130/70 mmHg 126/62 mmHg    Max Ex. HR 100 bpm 95 bpm    Max Ex. BP 160/78 mmHg 100/50 mmHg    RPE 11 9    Symptoms Yes (comment) No    Comments Patient had chest pain at pain level of 2 during walk. He informed me of this pain after the walk was over and said that pain was relieved once he stopped walking.         Psychological, QOL, Others - Outcomes: PHQ 2/9: Depression screen [facility] 2/9 10/28/2014 08/24/2014  Decreased Interest 0 0  Down, Depressed, Hopeless 0 0  PHQ - 2 Score 0 0  Altered sleeping 0 0  Tired, decreased energy 0 0  Change in  appetite 0 0  Feeling bad or failure about yourself  0 0  Trouble concentrating 0 0  Moving slowly or fidgety/restless 0 0  Suicidal thoughts 0 0  PHQ-9 Score 0 0    Quality of Life:     Quality of Life - 10/28/14 0656    Quality of Life Scores   Health/Function Pre 27.86 %   Health/Function Post 24.39 %   Health/Function % Change 12 %   Socioeconomic Pre 30 %   Socioeconomic Post 30 %   Socioeconomic % Change 0 %   Psych/Spiritual Pre 30 %   Psych/Spiritual Post 30 %   Psych/Spiritual % Change 0 %   Family Pre 30 %   Family Post 30 %   Family % Change 0 %   GLOBAL Pre 29.03 %   GLOBAL Post 27.55 %   GLOBAL % Change 5 %      Personal Goals: Goals established at orientation with interventions provided to work toward goal.     Personal Goals and Risk Factors at Admission - 08/24/14 1348    Personal Goals and Risk Factors on Admission   Increase Aerobic Exercise and Physical Activity Yes   Intervention While in program, learn and follow the exercise prescription taught. Start at a low level workload and increase workload after able to maintain previous level for 30 minutes. Increase time before increasing intensity.   Diabetes Yes   Goal Blood glucose control identified by blood glucose values, HgbA1C. Participant verbalizes understanding of the signs/symptoms of hyper/hypo glycemia, proper foot care and importance of medication and nutrition plan for blood glucose control.   Intervention Provide nutrition & aerobic exercise along with prescribed medications to achieve blood glucose in normal ranges: Fasting 65-99 mg/dL   Hypertension Yes   Goal Participant will see blood pressure controlled within the values of 140/57mm/Hg or within value directed by their physician.   Intervention Provide nutrition & aerobic exercise along with prescribed medications to achieve BP 140/90 or less.   Lipids Yes   Goal Cholesterol controlled with medications as prescribed, with  individualized exercise RX and with personalized nutrition plan. Value goals: LDL < 70mg , HDL >  40mg . Participant states understanding of desired cholesterol values and following prescriptions.   Intervention Provide nutrition & aerobic exercise along with prescribed medications to achieve LDL 70mg , HDL >40mg .       Personal Goals Discharge:     Goals and Risk Factor Review      08/26/14 1202 09/11/14 1207 10/14/14 1143 11/03/14 0801     Weight Management   Goals Progress/Improvement seen  Yes Yes No    Comments  Doing well with weight managment. A few pounds down since admission.  Is down 3 lbs since starting program. Wants to get down to 200. Has not seen much change in weight or body composition. Mavryk blames this on his chest pain as that limits his effort.     Increase Aerobic Exercise and Physical Activity   Goals Progress/Improvement seen  No No Yes Yes    Comments Goals Comments: Had intermittent chest discomfort "not like my heart attack" after approx 2 minutes of exercising on the treadmill. 2.29mph  No incline. I called Dr. Donivan Scull office but they were unable to see him so recommended ER which I passed that info along. I called the [facility] office where patient is usually seen and they recommended Emerg Dept. Also. With rest [place] did not have any further chest pain. I left a vm for his daughter also after I asked his permission to call her.  COntinues to have angina symptoms, especially when on the treadmill. Has appointment this afternoon with doctor. WIll talk to his doctor about his recurrent symptoms.  Is able to do more exercise and also exercises 3d/wk that he doesn't come to class. His chest pain is still limiting him a lot and his doctor told him to exercise up to the angina threshold. He is still very frustrated with this.  Raquel has been trying to push his ischemic threshold and increase his exercise time rather than increase intensity for a short time. This has helped to  increase the amount of exercise he can do before iliciting symptoms. He is still unable to exercise at his desired intensity due to chest pain. He is meeting with his MD to have an echo and possible stress test to gain insight on this problem he has been having since his procedure.     Diabetes   Goal  Blood glucose control identified by blood glucose values, HgbA1C. Participant verbalizes understanding of the signs/symptoms of hyper/hypo glycemia, proper foot care and importance of medication and nutrition plan for blood glucose control.      Progress seen towards goals  Yes      Comments  States blood sugar levels are controlled      Hypertension   Goal  Participant will see blood pressure controlled within the values of 140/66mm/Hg or within value directed by their physician.      Progress seen toward goals  Yes  Yes    Comments  BP in good range  Blood pressure readings go down to acceptable ranges during exercise and sometimes after exercise. He stll comes into class with high blood pressure at times. The exercise is helping and is evident in the execise and post exercise  readings                                Abnormal Lipids   Goal  Cholesterol controlled with medications as prescribed, with individualized exercise RX and with personalized nutrition plan. Value goals: LDL <  70mg , HDL > 40mg . Participant states understanding of desired cholesterol values and following prescriptions.      Progress seen towards goals  Unknown      Comments  no lab values to compare         Nutrition & Weight - Outcomes:     Pre Biometrics - 08/24/14 1459    Pre Biometrics   Height 5\' 10"  (1.778 m)   Weight 223 lb (101.152 kg)   Waist Circumference 44.5 inches   Hip Circumference 41 inches   Waist to Hip Ratio 1.09 %   BMI (Calculated) 32.1         Post Biometrics - 11/13/14 0845     Post  Biometrics   Height 5\' 10"  (1.778 m)   Weight 203 lb 1.6 oz (92.126 kg)   Waist Circumference 42.5 inches    Hip Circumference 38.5 inches   Waist to Hip Ratio 1.1 %   BMI (Calculated) 29.2      Nutrition:     Nutrition Therapy & Goals - 09/03/14 1151    Nutrition Therapy   Diet --  Mr. Ratay does not wish to meet with dietitian at this time.      Nutrition Discharge:     Rate Your Plate - 93/23/55 7322    Rate Your Plate Scores   Post Score 73   Post Score % 81 %      Education Questionnaire Score:     Knowledge Questionnaire Score - 10/28/14 0654    Knowledge Questionnaire Score   Pre Score 26/28   Post Score 23/28      Goals reviewed with patient; copy given to patient.

## 2014-11-13 NOTE — Progress Notes (Signed)
Daily Session Note  Patient Details  Name: Joseph [name] MRN: [id number] Date of Birth: [birth year] Referring Provider:  Leonie Man, MD  Encounter Date: 11/13/2014  Check In:     Session Check In - 11/13/14 0843    Check-In   Staff Present Diane Joya Gaskins RN, BSN;Trinity Haun Dillard Essex MS, ACSM CEP Exercise Physiologist;Other   ER physicians immediately available to respond to emergencies See telemetry face sheet for immediately available ER MD   Medication changes reported     No   Fall or balance concerns reported    No   Warm-up and Cool-down Performed on first and last piece of equipment   VAD Patient? No   Pain Assessment   Currently in Pain? No/denies   Multiple Pain Sites No           Exercise Prescription Changes - 11/13/14 0800    Exercise Review   Progression Yes   Response to Exercise   Symptoms None   Comments MD cleared [place] to play pickleball and since ER visit he has not been having any cardiac symptoms. He has felt good the past 2 days in rehab. He improved by 15% on his post 6MW test.Patient is approaching graduation of the program and home exercise plans were discussed. Details of the patient's exercise prescription and what they need to do in order to continue the prescription and progress with exercise were outlined and the patient verbalized understanding. The patient plans to complete all exercise at [facility] and [facility] to play pickleball 4 days a week.    Frequency Add 1 additional day to program exercise sessions.   Duration Progress to 30 minutes of continuous aerobic without signs/symptoms of physical distress   Intensity Rest + 30   Progression Continue progressive overload as per policy without signs/symptoms or physical distress.   Resistance Training   Training Prescription Yes   Weight 6   Reps 10-15   Interval Training   Interval Training No   Treadmill   MPH 2.7   Grade 0   Minutes 20   NuStep   Level 6   Watts 60   Minutes 20    REL-XR   Level 6   Watts 65   Minutes 15   Home Exercise Plan   Plans to continue exercise at [employer] (comment)  [facility] and [place] [employer] [facility] (pickleball)      Goals Met:  Independence with exercise equipment Exercise tolerated well Personal goals reviewed No report of cardiac concerns or symptoms Strength training completed today  Goals Unmet:  Not Applicable  Goals Comments: Patient is graduating today and home exercise plans were discussed. Details of the patient's exercise prescription and what they need to do in order to continue the prescription and progress with exercise were outlined and the patient verbalized understanding. The patient plans to complete all exercise at [place] and [place] rec centers playing pickleball.    Dr. Emily Filbert is Medical Director for [facility].

## 2014-11-13 NOTE — Patient Instructions (Addendum)
Medication Instructions:   Your physician recommends that you continue on your current medications as directed. Please refer to the Current Medication list given to you today.  Labwork:  NONE ORDER TODAY   Testing/Procedures:  NONE ORDER TODAY   Follow-Up:  KEEP APPOINTMENT ALREADY SCHEDULED  Any Other Special Instructions Will Be Listed Below (If Applicable).

## 2014-11-13 NOTE — Progress Notes (Signed)
Cardiology Office Note    Date:  11/13/2014   ID:  Joseph, [name] 08/19/1945, MRN [id number]  PCP:  Joseph Noble, MD  Cardiologist: Dr. Ellyn [name]   Electrophysiologist:  Dr. Rayann [name]   History of Present Illness: Joseph [name] is a 69 y.o. male with PMH of CAD s/p CABG '01 at [facility] (LIMA-LAD, SVG-D2, SVG-OM1, SVG-right PDA, SVG-right PL1) along with multiple PCIs, ISM/chroic systolic CHF (EF 35-45% 6/25), stage IV CKD (has had dialysis previously), chronic thrombocytopenia, T2DM, prostate cancer who presents to clinic today for post hospital follow up after admission for NSTEMI, Acute CHF and acute on CKD.  He had a cardiac catheterization by Dr. Humphrey [name] in [facility] 07/30/14 revealing all vein grafts occluded, (known dating to '09), patent LIMA to LAD, 70% ostial left main lesion, 70% ostial LCx, occluded RCA. He underwent PCI by Dr. Gwenlyn [name] 08/07/14 for critical left main and ostial LCx at 90% and had synergy DES to both. He had some chest pain on 7/20 at cardiac rehab (palpitations, PACs, PAT and bardycardia but no VT on 48 hor holter) leading to observation and negative cardiac markers, elevated d-dimer and VQ performed low risk for PE and ranexa restarted. He had some chest pain during other cardiac rehab and had follow-up 8/5 with uptitration of hydralazine and ranolazine.   He presented with left-sided chest pain and s/s volume overload. Patient was admitted to stepdown. We started on 80 mg of Lasix IV twice a day. IV nitroglycerin was continued as well as aspirin, Coreg, Plavix, Crestor, ranolazine. Patient had an acute drop in hemoglobin reports 8.7 to 7.8 despite diuresing 1 L at the time. He was transfused 1 unit of PRBCs. GI consult at and he was heme-negative at the time of exam, however, he ended having Hemoccult-positive stool. Hemoglobin was trending up, so they recommended outpatient evaluation. The patient's troponin was trending up and peaked at 6.92. Is difficult  to know if the patient had a primary NSTEMI or if it was demand ischemia. However, he did very well with cardiac rehabilitation and ambulated 2000 feet with no chest pain. 2-D echocardiogram revealed ejection fraction of 30-35% with moderate diffuse hypokinesis. There was akinesis scarring of the inferior lateral and inferior myocardium. Left atrium was severely dilated. Right ventricle was moderately dilated. Right atrium was mildly dilated. Was also seen in consult by Dr. Caryl [name] with EP and was not considered a candidate for ICD implantation at this time but could reconsider in 3-6 months. He was also seen by nephrology worsening chronic kidney disease who thought he likely had a component of hypoperfusion related to his heart failure on top of his progressively worsening chronic kidney disease. Dr. Jimmy [name] recommended keeping his blood pressure slightly higher than had been. Bicarbonate was increased to 1300 mg twice a day. His Lasix was later increased to 120 mg twice daily which would be his discharge dosing and was reviewed with Dr. Justin [name]. Kayexalate was not resumed at discharge. Potassium has been on the low side during hospitalization. He was weaned off IV nitroglycerin and Imdur was changed to 60 mg twice a day.  Today he presents for post hospital follow up. He is feeling better this week than he has felt for the last couple months. He saw nephrologist, Dr. Juleen [name] already who decreased his lasix from 120mg  BID to 40mg  BID. He is feeling so good that he can play pickle ball. He is able to play the whole game without stopping. Used to only  be able to hit 5 balls and have to sit down. He is in a great mood and has absolutely no complaints. NO SOB, orthopnea, PNR, LE edema presyncope or palpitations.  Studies:  - Echo (11/04/14): EF 30-35% with moderate diffuse hypokinesis. There was akinesis scarring of the inferior lateral and inferior myocardium. Left atrium was severely dilated. Right  ventricle was moderately dilated. Right atrium was mildly dilated.  -  Cath (08/07/2014) Synergy DES 2.5 mm x 18 mm to dist LM and ost LCx, rec lifelong DAPT   Recent Labs/Images:   Recent Labs  08/06/14 0447  11/04/14 0120  11/08/14 0306  NA 140  < >  --   < > 137  K 3.7  < >  --   < > 3.1*  BUN 45*  < >  --   < > 75*  CREATININE 3.90*  < >  --   < > 5.56*  ALT 13*  --   --   --   --   HGB  --   < >  --   < > 8.6*  TSH  --   --  2.026  --   --   LDLCALC  --   --  53  --   --   HDL  --   --  26*  --   --   BNP  --   --  2287.4*  --   --   < > = values in this interval not displayed.   Dg Chest Portable 1 View  11/03/2014   CLINICAL DATA:  Chest pain and shortness of breath.  EXAM: PORTABLE CHEST 1 VIEW  COMPARISON:  Frontal and lateral views 08/2014  FINDINGS: There is assessed median sternotomy. Cardiomegaly is again seen, question increased versus secondary to portable technique. There is perivascular haziness, particularly in the lower lobes, concerning for pulmonary edema. No confluent airspace disease to suggest pneumonia. No large pleural effusion or pneumothorax.  IMPRESSION: Suspect pulmonary edema. Cardiomegaly, may be increased from prior versus accentuated by portable technique.   Electronically Signed   By: Joseph [name] M.D.   On: 11/03/2014 22:07     Wt Readings from Last 3 Encounters:  11/13/14 205 lb 6.4 oz (93.169 kg)  11/13/14 203 lb 1.6 oz (92.126 kg)  11/08/14 208 lb 6.4 oz (94.53 kg)     Past Medical History  Diagnosis Date  . Hypertension   . Hypercholesterolemia   . Prostate cancer ([place])   . Hepatosplenomegaly 2015    fibrosis, no cirrhosis on 06/2013 liver biopsy  . Thrombocytopenia ([place]) 2015  . Myocardial infarction ([place]) 2001  . Depression with anxiety   . IDDM (insulin dependent diabetes mellitus) ([place])     INSULIN DEPENDENT  . GERD (gastroesophageal reflux disease)   . History of shingles   . Ischemic cardiomyopathy     a. echo 08/06/2014:  EF 25-30%, multiple WMA, mod DD, PASP 49 mm Hg   . CKD (chronic kidney disease), stage IV ([facility])     a. previously on dialysis; b. followed by Dr. Abigail Butts   . Coronary artery disease, occlusive     a. s/p 4v CABG 2001(LIMA-LAD, SVG-D2, SVG-OM1, SVG-right PDA, SVG-right PL1); b. cath 07/30/2014 vein grafts all down, patent LIMA to LAD, significant LM and ost LCx dx; c. cath 08/07/2014 Synergy DES 2.5 mm x 18 mm to dist LM and ost LCx, rec lifelong DAPT  . Coronary artery disease involving coronary bypass graft 08/2014  all vein grafts occluded. Patent LIMA-LAD -- s/p PCI ot LM-Cx;   [name] Chronic combined systolic and diastolic CHF, NYHA class 2 ([place])     a. echo 08/2013: EF 40-45%, impaired relaxation, mild MR, LA mildly dilated,    . Gallstone pancreatitis 2015    Current Outpatient Prescriptions  Medication Sig Dispense Refill  . aspirin 81 MG tablet Take 81 mg by mouth daily.    . calcitRIOL (ROCALTROL) 0.25 MCG capsule Take 0.25 mcg by mouth daily.     . carvedilol (COREG) 6.25 MG tablet Take 6.25 mg by mouth 2 (two) times daily with a meal.    . clopidogrel (PLAVIX) 75 MG tablet Take 1 tablet (75 mg total) by mouth daily. 90 tablet 3  . ferrous sulfate 325 (65 FE) MG EC tablet Take 325 mg by mouth daily with breakfast.     . furosemide (LASIX) 40 MG tablet Take 40 mg by mouth 2 (two) times daily.    . hydrALAZINE (APRESOLINE) 50 MG tablet Take 1 tablet (50 mg total) by mouth 2 (two) times daily. 60 tablet 5  . isosorbide mononitrate (IMDUR) 60 MG 24 hr tablet Take 1 tablet (60 mg total) by mouth 2 (two) times daily. 30 tablet 5  . LANTUS SOLOSTAR 100 UNIT/ML Solostar Pen Inject 20 Units into the skin at bedtime.     [name] LYRICA 75 MG capsule Take 75 mg by mouth 2 (two) times daily.   0  . nitroGLYCERIN (NITROLINGUAL) 0.4 MG/SPRAY spray Place 1 spray under the tongue every 5 (five) minutes x 3 doses as needed for chest pain. Do not use together with sublingual nitro 12 g 5  . ranolazine (RANEXA)  1000 MG SR tablet Take 1 tablet (1,000 mg total) by mouth 2 (two) times daily. 60 tablet 6  . rosuvastatin (CRESTOR) 20 MG tablet Take 20 mg by mouth daily.    . sodium bicarbonate 650 MG tablet Take 2 tablets (1,300 mg total) by mouth 2 (two) times daily. 120 tablet 5   No current facility-administered medications for this visit.     Allergies:   Sulfa antibiotics and Morphine and related   Social History:  The patient  reports that he quit smoking about 6 years ago. He has quit using smokeless tobacco. He reports that he does not drink alcohol or use illicit drugs.   Family History:  The patient's family history includes Acute myelogenous leukemia in his brother; CAD in his brother; Diabetes Mellitus II in his brother; Heart disease (age of onset: 56) in his father.   ROS:  Please see the history of present illness. All other systems reviewed and negative.    PHYSICAL EXAM: VS:  BP 100/46 mmHg  Pulse 52  Ht 5\' 11"  (1.803 m)  Wt 205 lb 6.4 oz (93.169 kg)  BMI 28.66 kg/m2 Well nourished, well developed, in no acute distress HEENT: normal Neck: no JVD Cardiac:  normal S1, S2; RRR; no murmur Lungs:  clear to auscultation bilaterally, no wheezing, rhonchi or rales Abd: soft, nontender, no hepatomegaly Ext: no edema Skin: warm and dry Neuro:  CNs 2-12 intact, no focal abnormalities noted  EKG:  HR 52. Sinus brady. 1st deg AV block. TWI in I, AVL and V3-V6 that are improved from previous tracing.     ASSESSMENT AND PLAN:  Joseph [name] is a 69 y.o. male with PMH of CAD s/p CABG '01 at [facility] (LIMA-LAD, SVG-D2, SVG-OM1, SVG-right PDA, SVG-right PL1) along with multiple PCIs, ISM/chronic  systolic CHF (EF 26-94% 8/54), stage IV CKD (has had dialysis previously), chronic thrombocytopenia, T2DM, prostate cancer who presents to clinic today for post hospital follow up after admission for NSTEMI, Acute CHF, acute on chronic anemia and acute on CKD.  CAD/recent NSTEMI- peak trop 6.82.  difficult to know if this was a primary NSTEMI or demand ischemia given significant underlying complex CAD in the setting of worsening CHF and anemia. Per notes, with worsening Cr and recent worsening anemia, plan for medical management at this time, deferring invasive evaluation for when these issues stabilize. I will defer further management to his primary cardiologist, Dr. Ellyn [name]. He is doing well today with no chest pain. -- Continue aspirin, Coreg, Plavix, Crestor, ranolazine and  Imdur 60 mg BID   Chronic systolic CHF- discharge weight 208lbs. Today 205 lbs and feeling great. No s/s CHF -- 2D ECHO revealed EF 30-35% with moderate diffuse hypokinesis. There was akinesis scarring of the inferior lateral and inferior myocardium. Severe LA dilation, mod RV dilation, mild RA dilation -- EP saw during admission and deemed not a candidate for ICD implantation at this time but could reconsider in 3-6 months. Will defer to Dr Joseph [name] at f/u -- Continue BB. No ACEI/ARB due to CKD. Hydralazine decreased by renal to allow increased BP. Discharged on Lasix 120mg  BID and saw nephrologist this week who decreased it to 40mg  BID.   CKD- he was also seen by nephrology worsening CKD who thought he likely had a component of hypoperfusion related to his heart failure on top of his progressively worsening chronic kidney disease. Dr. Jimmy [name] recommended keeping his blood pressure slightly higher than had been. Bicarbonate was increased to 1300 mg twice a day. His Lasix was later increased to 120 mg twice daily which would be his discharge dosing and was reviewed with Dr. Justin [name]. Kayexalate was not resumed at discharge.  -- He is followed by Dr. Juleen [name]. They are managing his diuretics. Continue 40mg  BID Lasix   Freq PVCS- seen by EP and BB increased. No PVCs today.  Acute on chronic anemia with chronic thrombocytopenia - s/p transfusion last admission. GI saw no evidence of bleeding and recommended no further GI  workup while inpatiet. Has f/u with hematology in [place]. May also be partially related to renal disease.   Possible GI bleed- hemoccult-positive stool but hemoglobin was trending up in hosptital so GI recommended outpatient evaluation. He had lab work to check this next Tuesday.    Disposition:  FU with Dr Joseph [name] on 01/20/15 which was previously scheduled.   Signed, Vesta Mixer, PA-C, MHS 11/13/2014 11:12 AM    [facility] [address], [place], [name]  [address] Phone: [phone]; Fax: [phone]

## 2014-11-13 NOTE — Progress Notes (Signed)
Cardiac Individual Treatment Plan  Patient Details  Name: Joseph [name] MRN: [id number] Date of Birth: [birth year] Referring Provider:  Leonie Man, MD  Initial Encounter Date:    Visit Diagnosis: NSTEMI (non-ST elevated myocardial infarction) [facility])  Patient's Home Medications on Admission:  Current outpatient prescriptions:  .  aspirin 81 MG tablet, Take 81 mg by mouth daily., Disp: , Rfl:  .  calcitRIOL (ROCALTROL) 0.25 MCG capsule, Take 0.25 mcg by mouth daily. , Disp: , Rfl:  .  carvedilol (COREG) 12.5 MG tablet, take 1/2 tablet by mouth twice a day with meals, Disp: , Rfl: 0 .  carvedilol (COREG) 6.25 MG tablet, Take 6.25 mg by mouth 2 (two) times daily with a meal., Disp: , Rfl:  .  clopidogrel (PLAVIX) 75 MG tablet, Take 1 tablet (75 mg total) by mouth daily., Disp: 90 tablet, Rfl: 3 .  ferrous sulfate 325 (65 FE) MG EC tablet, Take 325 mg by mouth daily with breakfast. , Disp: , Rfl:  .  furosemide (LASIX) 40 MG tablet, Take 3 tablets (120 mg total) by mouth 2 (two) times daily., Disp: 30 tablet, Rfl: 5 .  hydrALAZINE (APRESOLINE) 100 MG tablet, take 1/2 tablet by mouth three times a day, Disp: , Rfl: 0 .  hydrALAZINE (APRESOLINE) 50 MG tablet, Take 1 tablet (50 mg total) by mouth 2 (two) times daily., Disp: 60 tablet, Rfl: 5 .  isosorbide mononitrate (IMDUR) 30 MG 24 hr tablet, How much and how often?, Disp: , Rfl: 0 .  isosorbide mononitrate (IMDUR) 60 MG 24 hr tablet, Take 1 tablet (60 mg total) by mouth 2 (two) times daily., Disp: 30 tablet, Rfl: 5 .  KIONEX 15 GM/60ML suspension, How much and how often??, Disp: , Rfl: 0 .  LANTUS SOLOSTAR 100 UNIT/ML Solostar Pen, Inject 20 Units into the skin at bedtime. , Disp: , Rfl:  .  LYRICA 75 MG capsule, Take 75 mg by mouth 2 (two) times daily. , Disp: , Rfl: 0 .  nitroGLYCERIN (NITROLINGUAL) 0.4 MG/SPRAY spray, Place 1 spray under the tongue every 5 (five) minutes x 3 doses as needed for chest pain. Do not use together with  sublingual nitro, Disp: 12 g, Rfl: 5 .  ranolazine (RANEXA) 1000 MG SR tablet, Take 1 tablet (1,000 mg total) by mouth 2 (two) times daily., Disp: 60 tablet, Rfl: 6 .  rosuvastatin (CRESTOR) 20 MG tablet, Take 20 mg by mouth daily., Disp: , Rfl:  .  sodium bicarbonate 650 MG tablet, Take 2 tablets (1,300 mg total) by mouth 2 (two) times daily., Disp: 120 tablet, Rfl: 5 .  torsemide (DEMADEX) 20 MG tablet, Take 20 mg by mouth daily., Disp: , Rfl: 0  Past Medical History: Past Medical History  Diagnosis Date  . Hypertension   . Hypercholesterolemia   . Prostate cancer ([place])   . Hepatosplenomegaly 2015    fibrosis, no cirrhosis on 06/2013 liver biopsy  . Thrombocytopenia ([place]) 2015  . Myocardial infarction ([place]) 2001  . Depression with anxiety   . IDDM (insulin dependent diabetes mellitus) ([place])     INSULIN DEPENDENT  . GERD (gastroesophageal reflux disease)   . History of shingles   . Ischemic cardiomyopathy     a. echo 08/06/2014: EF 25-30%, multiple WMA, mod DD, PASP 49 mm Hg   . CKD (chronic kidney disease), stage IV ([facility])     a. previously on dialysis; b. followed by Dr. Abigail Butts   . Coronary artery disease, occlusive  a. s/p 4v CABG 2001(LIMA-LAD, SVG-D2, SVG-OM1, SVG-right PDA, SVG-right PL1); b. cath 07/30/2014 vein grafts all down, patent LIMA to LAD, significant LM and ost LCx dx; c. cath 08/07/2014 Synergy DES 2.5 mm x 18 mm to dist LM and ost LCx, rec lifelong DAPT  . Coronary artery disease involving coronary bypass graft 08/2014    all vein grafts occluded. Patent LIMA-LAD -- s/p PCI ot LM-Cx;   [name] Chronic combined systolic and diastolic CHF, NYHA class 2 (Joseph [name])     a. echo 08/2013: EF 40-45%, impaired relaxation, mild MR, LA mildly dilated,    . Gallstone pancreatitis 2015    Tobacco Use: History  Smoking status  . Former Smoker  . Quit date: 02/07/2008  Smokeless tobacco  . 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 Participant demonstrates ability to understand and report RPE using BORG scale, to self-measure pulse accurately, and to acknowledge the importance of the exercise prescription.  Exercise Prescription Goal: Starting with aerobic activity 30 plus minutes a day, 3 days per week for initial exercise prescription. Provide home exercise prescription and guidelines that participant acknowledges understanding prior to discharge.  Activity Barriers & Risk Stratification:     Activity Barriers & Risk Stratification - 08/24/14 1337    Activity Barriers & Risk Stratification   Activity Barriers None   Risk Stratification High      6 Minute Walk:     6 Minute Walk      08/24/14 1448 11/13/14 0843     6 Minute Walk   Phase Initial Discharge    Distance 1265 feet 1450 feet    Distance % Change  15 %    Walk Time 6 minutes 6 minutes    Resting HR 59 bpm 56 bpm    Resting BP 130/70 mmHg 126/62 mmHg    Max Ex. HR 100 bpm 95 bpm    Max Ex. BP 160/78 mmHg 100/50 mmHg    RPE 11 9    Symptoms Yes (comment) No    Comments Patient had chest pain at pain level of 2 during walk. He informed me of this pain after the walk was over and said that pain was relieved once he stopped walking.         Initial Exercise Prescription:     Initial Exercise Prescription - 08/24/14 1400    Date of Initial Exercise Prescription   Date 08/24/14   Treadmill   MPH 2.3   Grade 0   Minutes 15   Bike   Level 0.8   Minutes 15   Recumbant Bike   Level 2   RPM 40   Watts 20   Minutes 15   NuStep   Level 3   Watts 30   Minutes 15   Arm Ergometer    Level 1   Watts 10   Minutes 15   Arm/Foot Ergometer   Level 1   Watts 12   Minutes 15  Cybex   Level 1   RPM 40   Minutes 15   Recumbant Elliptical   Level 1   RPM 40   Watts 20   Minutes 15   Elliptical   Level 1   Speed 3   Minutes 5   REL-XR   Level 3   Watts 30   Minutes 15   Prescription Details   Frequency (times per week) 3   Duration Progress to 30 minutes of continuous aerobic without signs/symptoms of physical distress   Intensity   THRR REST +  30   Ratings of Perceived Exertion 11-15   Progression Continue progressive overload as per policy without signs/symptoms or physical distress.   Resistance Training   Training Prescription Yes   Weight 2   Reps 10-12      Exercise Prescription Changes:     Exercise Prescription Changes      08/31/14 0900 09/09/14 0900 09/15/14 0600 10/07/14 0800 10/20/14 0600   Exercise Review   Progression _0    Response to Exercise   Blood Pressure (Admit)   132/78 mmHg 132/78 mmHg 136/64 mmHg   Blood Pressure (Exercise)   150/70 mmHg 150/70 mmHg 138/64 mmHg   Blood Pressure (Exit)   140/70 mmHg 140/70 mmHg 140/70 mmHg   Heart Rate (Admit)   78 bpm 78 bpm 74 bpm   Heart Rate (Exercise)   85 bpm 85 bpm 83 bpm   Heart Rate (Exit)   83 bpm 83 bpm 65 bpm   Rating of Perceived Exertion (Exercise)   _1 Symptoms   Experiencing chest pain when walking on the treadmill. Allieviated with rest or stopping.  Experiencing chest pain when walking on the treadmill. Allieviated with rest or stopping.  [name] angina with exercise, angina is relieved when exercise is stopped or slowed   Comments   MD cleared him to resume playing pickleball MD cleared him to resume playing pickleball MD cleared him to resume playing pickleball and to exercise with angina   Frequency   Add 1 additional day to program exercise sessions. Add 1 additional day to program exercise sessions. Add 1 additional day to program exercise  sessions.   Duration Progress to 30 minutes of continuous aerobic without signs/symptoms of physical distress Progress to 30 minutes of continuous aerobic without signs/symptoms of physical distress Progress to 30 minutes of continuous aerobic without signs/symptoms of physical distress Progress to 30 minutes of continuous aerobic without signs/symptoms of physical distress Progress to 30 minutes of continuous aerobic without signs/symptoms of physical distress   Intensity Rest + 30 Rest + 30 Rest + 30 Rest + 30 Rest + 30   Progression Continue progressive overload as per policy without signs/symptoms or physical distress. Continue progressive overload as per policy without signs/symptoms or physical distress. Continue progressive overload as per policy without signs/symptoms or physical distress. Continue progressive overload as per policy without signs/symptoms or physical distress. Continue progressive overload as per policy without signs/symptoms or physical distress.   Resistance Training   Training Prescription _2    Weight _3 Reps 10-15 10-15 10-15 10-15 10-15   Interval Training   Interval Training _4    Treadmill   MPH 2.4 2.4 2.7 2.7 2.7   Grade 0 0 0 0 0   Minutes _5 NuStep   Level   6 6 6  Watts   60 60 60   Minutes   _0 REL-XR   Level   _1 Watts   65 65 65   Minutes   _2 11/09/14 1000 11/10/14 0700 11/13/14 0800       Exercise Review   Progression Yes No  No increases due to symptoms and ER visit Yes     Response to Exercise   Blood Pressure (Admit)  152/70 mmHg      Blood Pressure (Exercise)  132/70 mmHg      Blood Pressure (Exit)  156/70 mmHg      Heart Rate (Admit)  69 bpm      Heart Rate (Exercise)  71 bpm      Heart Rate (Exit)  70 bpm      Rating of Perceived Exertion (Exercise)  11      Symptoms [name] angina with exercise, angina is relieved when exercise is stopped or slowed Was  experiencing chest pain and went to the ER; MDs approved [facility] for light exercise. None     Comments MD cleared him to resume playing pickleball and to exercise with angina ER discharge summary cleared [facility] for light exercise. All his workloads have been reduced MD cleared Joseph [name] to play pickleball and since ER visit he [name] not been having any cardiac symptoms. He [name] felt good the past 2 days in rehab. He improved by 15% on his post 6MW test.Patient is approaching graduation of the program and home exercise plans were discussed. Details of the patient's exercise prescription and what they need to do in order to continue the prescription and progress with exercise were outlined and the patient verbalized understanding. The patient plans to complete all exercise at [facility] to play pickleball 4 days a week.      Frequency Add 1 additional day to program exercise sessions. Add 1 additional day to program exercise sessions. Add 1 additional day to program exercise sessions.     Duration Progress to 30 minutes of continuous aerobic without signs/symptoms of physical distress Progress to 30 minutes of continuous aerobic without signs/symptoms of physical distress Progress to 30 minutes of continuous aerobic without signs/symptoms of physical distress     Intensity Rest + 30 Rest + 30 Rest + 30     Progression Continue progressive overload as per policy without signs/symptoms or physical distress. Continue progressive overload as per policy without signs/symptoms or physical distress. Continue progressive overload as per policy without signs/symptoms or physical distress.     Resistance Training   Training Prescription Yes Yes Yes     Weight _3 Reps 10-15 10-15 10-15     Interval Training   Interval Training No No No     Treadmill   MPH 2.7 2.7 2.7     Grade 0 0 0     Minutes _4 NuStep   Level _5 Watts 60 60 60     Minutes _6 REL-XR   Level _7 Watts 65 65 65     Minutes _8 Home Exercise Plan   Plans to continue exercise at   [employer] (comment)  [facility] (pickleball)  Discharge Exercise Prescription (Final Exercise Prescription Changes):     Exercise Prescription Changes - 11/13/14 0800    Exercise Review   Progression Yes   Response to Exercise   Symptoms None   Comments MD cleared Joseph [name] to play pickleball and since ER visit he [name] not been having any cardiac symptoms. He [name] felt good the past 2 days in rehab. He improved by 15% on his post 6MW test.Patient is approaching graduation of the program and home exercise plans were discussed. Details of the patient's exercise prescription and what they need to do in order to continue the prescription and progress with exercise were outlined and the patient verbalized understanding. The patient plans to complete all exercise at [facility] to play pickleball 4 days a week.    Frequency Add 1 additional day to program exercise sessions.   Duration Progress to 30 minutes of continuous aerobic without signs/symptoms of physical distress   Intensity Rest + 30   Progression Continue progressive overload as per policy without signs/symptoms or physical distress.   Resistance Training   Training Prescription Yes   Weight 6   Reps 10-15   Interval Training   Interval Training No   Treadmill   MPH 2.7   Grade 0   Minutes 20   NuStep   Level 6   Watts 60   Minutes 20   REL-XR   Level 6   Watts 65   Minutes 15   Home Exercise Plan   Plans to continue exercise at [employer] (comment)  [facility] (pickleball)      Nutrition:  Target Goals: Understanding of nutrition guidelines, daily intake of sodium <1538m, cholesterol <2060m calories 30% from fat and 7% or less from saturated fats, daily to have 5 or more servings of fruits and vegetables.  Biometrics:     Pre Biometrics -  08/24/14 1459    Pre Biometrics   Height _0  (1.778 m)   Weight 223 lb (101.152 kg)   Waist Circumference 44.5 inches   Hip Circumference 41 inches   Waist to Hip Ratio 1.09 %   BMI (Calculated) 32.1         Post Biometrics - 11/13/14 0845     Post  Biometrics   Height _1  (1.778 m)   Weight 203 lb 1.6 oz (92.126 kg)   Waist Circumference 42.5 inches   Hip Circumference 38.5 inches   Waist to Hip Ratio 1.1 %   BMI (Calculated) 29.2      Nutrition Therapy Plan and Nutrition Goals:     Nutrition Therapy & Goals - 09/03/14 1151    Nutrition Therapy   Diet --  Joseph [name] not wish to meet with dietitian at this time.      Nutrition Discharge: Rate Your Plate Scores:     Rate Your Plate - 0929/47/6504650  Rate Your Plate Scores   Post Score 73   Post Score % 81 %      Nutrition Goals Re-Evaluation:   Psychosocial: Target Goals: Acknowledge presence or absence of depression, maximize coping skills, provide positive support system. Participant is able to verbalize types and ability to use techniques and skills needed for reducing stress and depression.  Initial Review & Psychosocial Screening:     Initial Psych Review & Screening - 08/24/14 1358    Screening Interventions   Interventions Encouraged to exercise;Program counselor consult  Quality of Life Scores:     Quality of Life - 10/28/14 0656    Quality of Life Scores   Health/Function Pre 27.86 %   Health/Function Post 24.39 %   Health/Function % Change 12 %   Socioeconomic Pre 30 %   Socioeconomic Post 30 %   Socioeconomic % Change 0 %   Psych/Spiritual Pre 30 %   Psych/Spiritual Post 30 %   Psych/Spiritual % Change 0 %   Family Pre 30 %   Family Post 30 %   Family % Change 0 %   GLOBAL Pre 29.03 %   GLOBAL Post 27.55 %   GLOBAL % Change 5 %      PHQ-9:     Recent Review Flowsheet Data    Depression screen [facility] 2/9 10/28/2014 08/24/2014   Decreased Interest 0 0   Down,  Depressed, Hopeless 0 0   PHQ - 2 Score 0 0   Altered sleeping 0 0   Tired, decreased energy 0 0   Change in appetite 0 0   Feeling bad or failure about yourself  0 0   Trouble concentrating 0 0   Moving slowly or fidgety/restless 0 0   Suicidal thoughts 0 0   PHQ-9 Score 0 0      Psychosocial Evaluation and Intervention:     Psychosocial Evaluation - 08/31/14 1017    Psychosocial Evaluation & Interventions   Interventions Stress management education;Relaxation education;Encouraged to exercise with the program and follow exercise prescription   Comments Counselor met with Joseph [name] today for initial psychosocial evaluation.  He is a 69 year old individual who had a stent inserted approximately 3 weeks ago.  He lives alone but [name] a strong support system with (2) adult daughters who live close by and he is actively involved in his local church.  Joseph [name] [name] other health issues including diabetes, and kidney failure.  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  He reports sleeping well and [name] a good appetite.  He denies current depressive symptoms, but admits he experienced some untreated depression when his spouse left 7 years ago.  Joseph [name] is typically in a positive mood and reports minimal stress in his life currently.  His goals are to get his stamina and strength back in order to get back on the "pickle court" (like indoor tennis) as soon as possible.     Continued Psychosocial Services Needed Yes  Joseph [name] will benefit from all of the psychoeducational components of this program and consistent exercise.        Psychosocial Re-Evaluation:   Vocational Rehabilitation: Provide vocational rehab assistance to qualifying candidates.   Vocational Rehab Evaluation & Intervention:     Vocational Rehab - 08/24/14 1338    Initial Vocational Rehab Evaluation & Intervention   Assessment shows need for Vocational Rehabilitation No       Education: Education Goals: Education classes will be provided on a weekly basis, covering required topics. Participant will state understanding/return demonstration of topics presented.  Learning Barriers/Preferences:     Learning Barriers/Preferences - 08/24/14 1338    Learning Barriers/Preferences   Learning Barriers None   Learning Preferences None      Education Topics: General Nutrition Guidelines/Fats and Fiber: -Group instruction provided by verbal, written material, models and posters to present the general guidelines for heart healthy nutrition. Gives an explanation and review of dietary fats and fiber.   Controlling Sodium/Reading Food  Labels: -Group verbal and written material supporting the discussion of sodium use in heart healthy nutrition. Review and explanation with models, verbal and written materials for utilization of the food label.   Exercise Physiology & Risk Factors: - Group verbal and written instruction with models to review the exercise physiology of the cardiovascular system and associated critical values. Details cardiovascular disease risk factors and the goals associated with each risk factor.          Cardiac Rehab from 10/26/2014 in [facility] Cardiac Rehab   Date  10/14/14   Educator  RM   Instruction Review Code  2- meets goals/outcomes      Aerobic Exercise & Resistance Training: - Gives group verbal and written discussion on the health impact of inactivity. On the components of aerobic and resistive training programs and the benefits of this training and how to safely progress through these programs.   Flexibility, Balance, General Exercise Guidelines: - Provides group verbal and written instruction on the benefits of flexibility and balance training programs. Provides general exercise guidelines with specific guidelines to those with heart or lung disease. Demonstration and skill practice provided.      Cardiac Rehab from 10/26/2014 in [facility]  Cardiac Rehab   Date  10/26/14   Educator  [facility]   Instruction Review Code  2- meets goals/outcomes      Stress Management: - Provides group verbal and written instruction about the health risks of elevated stress, cause of high stress, and healthy ways to reduce stress.   Depression: - Provides group verbal and written instruction on the correlation between heart/lung disease and depressed mood, treatment options, and the stigmas associated with seeking treatment.   Anatomy & Physiology of the Heart: - Group verbal and written instruction and models provide basic cardiac anatomy and physiology, with the coronary electrical and arterial systems. Review of: AMI, Angina, Valve disease, Heart Failure, Cardiac Arrhythmia, Pacemakers, and the ICD.   Cardiac Procedures: - Group verbal and written instruction and models to describe the testing methods done to diagnose heart disease. Reviews the outcomes of the test results. Describes the treatment choices: Medical Management, Angioplasty, or Coronary Bypass Surgery.   Cardiac Medications: - Group verbal and written instruction to review commonly prescribed medications for heart disease. Reviews the medication, class of the drug, and side effects. Includes the steps to properly store meds and maintain the prescription regimen.   Go Sex-Intimacy & Heart Disease, Get SMART - Goal Setting: - Group verbal and written instruction through game format to discuss heart disease and the return to sexual intimacy. Provides group verbal and written material to discuss and apply goal setting through the application of the S.M.A.R.T. Method.   Other Matters of the Heart: - Provides group verbal, written materials and models to describe Heart Failure, Angina, Valve Disease, and Diabetes in the realm of heart disease. Includes description of the disease process and treatment options available to the cardiac patient.   Exercise & Equipment Safety: -  Individual verbal instruction and demonstration of equipment use and safety with use of the equipment.      Cardiac Rehab from 10/26/2014 in [facility] Cardiac Rehab   Date  08/24/14   Educator  SB   Instruction Review Code  2- meets goals/outcomes      Infection Prevention: - Provides verbal and written material to individual with discussion of infection control including proper hand washing and proper equipment cleaning during exercise session.      Cardiac Rehab from 10/26/2014  in [facility] Cardiac Rehab   Date  08/24/14   Educator  SB   Instruction Review Code  2- meets goals/outcomes      Falls Prevention: - Provides verbal and written material to individual with discussion of falls prevention and safety.      Cardiac Rehab from 10/26/2014 in [facility] Cardiac Rehab   Date  08/24/14   Educator  SB   Instruction Review Code  2- meets goals/outcomes      Diabetes: - Individual verbal and written instruction to review signs/symptoms of diabetes, desired ranges of glucose level fasting, after meals and with exercise. Advice that pre and post exercise glucose checks will be done for 3 sessions at entry of program.      Cardiac Rehab from 10/26/2014 in [facility] Cardiac Rehab   Date  08/24/14   Educator  SB   Instruction Review Code  2- meets goals/outcomes       Knowledge Questionnaire Score:     Knowledge Questionnaire Score - 10/28/14 0654    Knowledge Questionnaire Score   Pre Score 26/28   Post Score 23/28      Personal Goals and Risk Factors at Admission:     Personal Goals and Risk Factors at Admission - 08/24/14 1348    Personal Goals and Risk Factors on Admission   Increase Aerobic Exercise and Physical Activity Yes   Intervention While in program, learn and follow the exercise prescription taught. Start at a low level workload and increase workload after able to maintain previous level for 30 minutes. Increase time before increasing intensity.   Diabetes Yes   Goal Blood glucose  control identified by blood glucose values, HgbA1C. Participant verbalizes understanding of the signs/symptoms of hyper/hypo glycemia, proper foot care and importance of medication and nutrition plan for blood glucose control.   Intervention Provide nutrition & aerobic exercise along with prescribed medications to achieve blood glucose in normal ranges: Fasting 65-99 mg/dL   Hypertension Yes   Goal Participant will see blood pressure controlled within the values of 140/63m/Hg or within value directed by their physician.   Intervention Provide nutrition & aerobic exercise along with prescribed medications to achieve BP 140/90 or less.   Lipids Yes   Goal Cholesterol controlled with medications as prescribed, with individualized exercise RX and with personalized nutrition plan. Value goals: LDL < 749m HDL > 4039mParticipant states understanding of desired cholesterol values and following prescriptions.   Intervention Provide nutrition & aerobic exercise along with prescribed medications to achieve LDL <38m78mDL >40mg10m   Personal Goals and Risk Factors Review:      Goals and Risk Factor Review      08/26/14 1202 09/11/14 1207 10/14/14 1143 11/03/14 0801     Weight Management   Goals Progress/Improvement seen  Yes Yes No    Comments  Doing well with weight managment. A few pounds down since admission.  Is down 3 lbs since starting program. Wants to get down to 200. [name] not seen much change in weight or body composition. Joseph [name] this on his chest pain as that limits his effort.     Increase Aerobic Exercise and Physical Activity   Goals Progress/Improvement seen  No No Yes Yes    Comments Goals Comments: Had intermittent chest discomfort "not like my heart attack" after approx 2 minutes of exercising on the treadmill. 2.2mph 35m incline. I called Dr. GollanDonivan [name] but they were unable to see him so recommended ER which I  passed that info along. I called the [facility] office where  patient is usually seen and they recommended Emerg Dept. Also. With rest Joseph [name] did not have any further chest pain. I left a vm for his daughter also after I asked his permission to call her.  COntinues to have angina symptoms, especially when on the treadmill. [name] appointment this afternoon with doctor. WIll talk to his doctor about his recurrent symptoms.  Is able to do more exercise and also exercises 3d/wk that he doesn't come to class. His chest pain is still limiting him a lot and his doctor told him to exercise up to the angina threshold. He is still very frustrated with this.  Joseph [name] been trying to push his ischemic threshold and increase his exercise time rather than increase intensity for a short time. This [name] helped to increase the amount of exercise he can do before iliciting symptoms. He is still unable to exercise at his desired intensity due to chest pain. He is meeting with his MD to have an echo and possible stress test to gain insight on this problem he [name] been having since his procedure.     Diabetes   Goal  Blood glucose control identified by blood glucose values, HgbA1C. Participant verbalizes understanding of the signs/symptoms of hyper/hypo glycemia, proper foot care and importance of medication and nutrition plan for blood glucose control.      Progress seen towards goals  Yes      Comments  States blood sugar levels are controlled      Hypertension   Goal  Participant will see blood pressure controlled within the values of 140/58m/Hg or within value directed by their physician.      Progress seen toward goals  Yes  Yes    Comments  BP in good range  Blood pressure readings go down to acceptable ranges during exercise and sometimes after exercise. He stll comes into class with high blood pressure at times. The exercise is helping and is evident in the execise and post exercise  readings                                Abnormal Lipids   Goal  Cholesterol controlled with  medications as prescribed, with individualized exercise RX and with personalized nutrition plan. Value goals: LDL < 753m HDL > 4052mParticipant states understanding of desired cholesterol values and following prescriptions.      Progress seen towards goals  Unknown      Comments  no lab values to compare         Personal Goals Discharge (Final Personal Goals and Risk Factors Review):      Goals and Risk Factor Review - 11/03/14 0801    Weight Management   Goals Progress/Improvement seen No   Comments [name] not seen much change in weight or body composition. Joseph [name] this on his chest pain as that limits his effort.    Increase Aerobic Exercise and Physical Activity   Goals Progress/Improvement seen  Yes   Comments Joseph [name] been trying to push his ischemic threshold and increase his exercise time rather than increase intensity for a short time. This [name] helped to increase the amount of exercise he can do before iliciting symptoms. He is still unable to exercise at his desired intensity due to chest pain. He is meeting with his MD to have an echo and possible stress test to gain  insight on this problem he [name] been having since his procedure.    Hypertension   Progress seen toward goals Yes   Comments Blood pressure readings go down to acceptable ranges during exercise and sometimes after exercise. He stll comes into class with high blood pressure at times. The exercise is helping and is evident in the execise and post exercise  readings                                   Comments: Patient completed the Cardiac Rehab Program today, October 7th, 2016 - 36/36 sessions.

## 2014-11-13 NOTE — Patient Instructions (Signed)
Discharge Instructions  Patient Details  Name: Joseph [name] MRN: [id number] Date of Birth: [birth year] Referring Provider:  Leonie Man, MD   Number of Visits:36  Reason for Discharge:  Patient reached a stable level of exercise. Patient independent in their exercise.  Smoking History:  History  Smoking status  . Former Smoker  . Quit date: 02/07/2008  Smokeless tobacco  . Former User    Diagnosis:  NSTEMI (non-ST elevated myocardial infarction) [facility])  Initial Exercise Prescription:     Initial Exercise Prescription - 08/24/14 1400    Date of Initial Exercise Prescription   Date 08/24/14   Treadmill   MPH 2.3   Grade 0   Minutes 15   Bike   Level 0.8   Minutes 15   Recumbant Bike   Level 2   RPM 40   Watts 20   Minutes 15   NuStep   Level 3   Watts 30   Minutes 15   Arm Ergometer   Level 1   Watts 10   Minutes 15   Arm/Foot Ergometer   Level 1   Watts 12   Minutes 15   Cybex   Level 1   RPM 40   Minutes 15   Recumbant Elliptical   Level 1   RPM 40   Watts 20   Minutes 15   Elliptical   Level 1   Speed 3   Minutes 5   REL-XR   Level 3   Watts 30   Minutes 15   Prescription Details   Frequency (times per week) 3   Duration Progress to 30 minutes of continuous aerobic without signs/symptoms of physical distress   Intensity   THRR REST +  30   Ratings of Perceived Exertion 11-15   Progression Continue progressive overload as per policy without signs/symptoms or physical distress.   Resistance Training   Training Prescription Yes   Weight 2   Reps 10-12      Discharge Exercise Prescription (Final Exercise Prescription Changes):     Exercise Prescription Changes - 11/13/14 0800    Exercise Review   Progression Yes   Response to Exercise   Symptoms None   Comments MD cleared Joseph [name] to play pickleball and since ER visit he has not been having any cardiac symptoms. He has felt good the past 2 days in rehab. He improved by 15%  on his post 6MW test.Patient is approaching graduation of the program and home exercise plans were discussed. Details of the patient's exercise prescription and what they need to do in order to continue the prescription and progress with exercise were outlined and the patient verbalized understanding. The patient plans to complete all exercise at [facility] and [facility] to play pickleball 4 days a week.    Frequency Add 1 additional day to program exercise sessions.   Duration Progress to 30 minutes of continuous aerobic without signs/symptoms of physical distress   Intensity Rest + 30   Progression Continue progressive overload as per policy without signs/symptoms or physical distress.   Resistance Training   Training Prescription Yes   Weight 6   Reps 10-15   Interval Training   Interval Training No   Treadmill   MPH 2.7   Grade 0   Minutes 20   NuStep   Level 6   Watts 60   Minutes 20   REL-XR   Level 6   Watts 65   Minutes 15  Home Exercise Plan   Plans to continue exercise at [facility] (comment)  [facility] (pickleball)      Functional Capacity:     6 Minute Walk      08/24/14 1448 11/13/14 0843     6 Minute Walk   Phase Initial Discharge    Distance 1265 feet 1450 feet    Distance % Change  15 %    Walk Time 6 minutes 6 minutes    Resting HR 59 bpm 56 bpm    Resting BP 130/70 mmHg 126/62 mmHg    Max Ex. HR 100 bpm 95 bpm    Max Ex. BP 160/78 mmHg 100/50 mmHg    RPE 11 9    Symptoms Yes (comment) No    Comments Patient had chest pain at pain level of 2 during walk. He informed me of this pain after the walk was over and said that pain was relieved once he stopped walking.         Quality of Life:     Quality of Life - 10/28/14 0656    Quality of Life Scores   Health/Function Pre 27.86 %   Health/Function Post 24.39 %   Health/Function % Change 12 %   Socioeconomic Pre 30 %   Socioeconomic Post 30 %   Socioeconomic %  Change 0 %   Psych/Spiritual Pre 30 %   Psych/Spiritual Post 30 %   Psych/Spiritual % Change 0 %   Family Pre 30 %   Family Post 30 %   Family % Change 0 %   GLOBAL Pre 29.03 %   GLOBAL Post 27.55 %   GLOBAL % Change 5 %      Personal Goals: Goals established at orientation with interventions provided to work toward goal.     Personal Goals and Risk Factors at Admission - 08/24/14 1348    Personal Goals and Risk Factors on Admission   Increase Aerobic Exercise and Physical Activity Yes   Intervention While in program, learn and follow the exercise prescription taught. Start at a low level workload and increase workload after able to maintain previous level for 30 minutes. Increase time before increasing intensity.   Diabetes Yes   Goal Blood glucose control identified by blood glucose values, HgbA1C. Participant verbalizes understanding of the signs/symptoms of hyper/hypo glycemia, proper foot care and importance of medication and nutrition plan for blood glucose control.   Intervention Provide nutrition & aerobic exercise along with prescribed medications to achieve blood glucose in normal ranges: Fasting 65-99 mg/dL   Hypertension Yes   Goal Participant will see blood pressure controlled within the values of 140/30mm/Hg or within value directed by their physician.   Intervention Provide nutrition & aerobic exercise along with prescribed medications to achieve BP 140/90 or less.   Lipids Yes   Goal Cholesterol controlled with medications as prescribed, with individualized exercise RX and with personalized nutrition plan. Value goals: LDL < 70mg , HDL > 40mg . Participant states understanding of desired cholesterol values and following prescriptions.   Intervention Provide nutrition & aerobic exercise along with prescribed medications to achieve LDL 70mg , HDL >40mg .       Personal Goals Discharge:     Goals and Risk Factor Review - 11/03/14 0801    Weight Management   Goals  Progress/Improvement seen No   Comments Has not seen much change in weight or body composition. Joseph [name] blames this on his chest pain as that limits his effort.  Increase Aerobic Exercise and Physical Activity   Goals Progress/Improvement seen  Yes   Comments Joseph [name] has been trying to push his ischemic threshold and increase his exercise time rather than increase intensity for a short time. This has helped to increase the amount of exercise he can do before iliciting symptoms. He is still unable to exercise at his desired intensity due to chest pain. He is meeting with his MD to have an echo and possible stress test to gain insight on this problem he has been having since his procedure.    Hypertension   Progress seen toward goals Yes   Comments Blood pressure readings go down to acceptable ranges during exercise and sometimes after exercise. He stll comes into class with high blood pressure at times. The exercise is helping and is evident in the execise and post exercise  readings                                  Nutrition & Weight - Outcomes:     Pre Biometrics - 08/24/14 1459    Pre Biometrics   Height 5\' 10"  (1.778 m)   Weight 223 lb (101.152 kg)   Waist Circumference 44.5 inches   Hip Circumference 41 inches   Waist to Hip Ratio 1.09 %   BMI (Calculated) 32.1         Post Biometrics - 11/13/14 0845     Post  Biometrics   Height 5\' 10"  (1.778 m)   Weight 203 lb 1.6 oz (92.126 kg)   Waist Circumference 42.5 inches   Hip Circumference 38.5 inches   Waist to Hip Ratio 1.1 %   BMI (Calculated) 29.2      Nutrition:     Nutrition Therapy & Goals - 09/03/14 1151    Nutrition Therapy   Diet --  Joseph [name] does not wish to meet with dietitian at this time.      Nutrition Discharge:     Rate Your Plate - 14/78/29 5621    Rate Your Plate Scores   Post Score 73   Post Score % 81 %      Education Questionnaire Score:     Knowledge Questionnaire Score - 10/28/14 0654     Knowledge Questionnaire Score   Pre Score 26/28   Post Score 23/28      Goals reviewed with patient; copy given to patient.

## 2014-11-17 NOTE — Progress Notes (Signed)
Cardiac Individual Treatment Plan  Patient Details  Name: Joseph [name] MRN: [id number] Date of Birth: May 17, 1945 Referring Provider:  Leonie Man, MD  Initial Encounter Date:    Visit Diagnosis: NSTEMI (non-ST elevated myocardial infarction) [facility])  Patient's Home Medications on Admission:  Current outpatient prescriptions:  .  aspirin 81 MG tablet, Take 81 mg by mouth daily., Disp: , Rfl:  .  calcitRIOL (ROCALTROL) 0.25 MCG capsule, Take 0.25 mcg by mouth daily. , Disp: , Rfl:  .  carvedilol (COREG) 6.25 MG tablet, Take 6.25 mg by mouth 2 (two) times daily with a meal., Disp: , Rfl:  .  clopidogrel (PLAVIX) 75 MG tablet, Take 1 tablet (75 mg total) by mouth daily., Disp: 90 tablet, Rfl: 3 .  ferrous sulfate 325 (65 FE) MG EC tablet, Take 325 mg by mouth daily with breakfast. , Disp: , Rfl:  .  furosemide (LASIX) 40 MG tablet, Take 40 mg by mouth 2 (two) times daily., Disp: , Rfl:  .  hydrALAZINE (APRESOLINE) 50 MG tablet, Take 1 tablet (50 mg total) by mouth 2 (two) times daily., Disp: 60 tablet, Rfl: 5 .  isosorbide mononitrate (IMDUR) 60 MG 24 hr tablet, Take 1 tablet (60 mg total) by mouth 2 (two) times daily., Disp: 30 tablet, Rfl: 5 .  LANTUS SOLOSTAR 100 UNIT/ML Solostar Pen, Inject 20 Units into the skin at bedtime. , Disp: , Rfl:  .  LYRICA 75 MG capsule, Take 75 mg by mouth 2 (two) times daily. , Disp: , Rfl: 0 .  nitroGLYCERIN (NITROLINGUAL) 0.4 MG/SPRAY spray, Place 1 spray under the tongue every 5 (five) minutes x 3 doses as needed for chest pain. Do not use together with sublingual nitro, Disp: 12 g, Rfl: 5 .  ranolazine (RANEXA) 1000 MG SR tablet, Take 1 tablet (1,000 mg total) by mouth 2 (two) times daily., Disp: 60 tablet, Rfl: 6 .  rosuvastatin (CRESTOR) 20 MG tablet, Take 20 mg by mouth daily., Disp: , Rfl:  .  sodium bicarbonate 650 MG tablet, Take 2 tablets (1,300 mg total) by mouth 2 (two) times daily., Disp: 120 tablet, Rfl: 5  Past Medical History: Past  Medical History  Diagnosis Date  . Hypertension   . Hypercholesterolemia   . Prostate cancer ([place])   . Hepatosplenomegaly 2015    fibrosis, no cirrhosis on 06/2013 liver biopsy  . Thrombocytopenia ([place]) 2015  . Myocardial infarction ([place]) 2001  . Depression with anxiety   . IDDM (insulin dependent diabetes mellitus) ([place])     INSULIN DEPENDENT  . GERD (gastroesophageal reflux disease)   . History of shingles   . Ischemic cardiomyopathy     a. echo 08/06/2014: EF 25-30%, multiple WMA, mod DD, PASP 49 mm Hg   . CKD (chronic kidney disease), stage IV ([facility])     a. previously on dialysis; b. followed by Dr. Abigail Butts   . Coronary artery disease, occlusive     a. s/p 4v CABG 2001(LIMA-LAD, SVG-D2, SVG-OM1, SVG-right PDA, SVG-right PL1); b. cath 07/30/2014 vein grafts all down, patent LIMA to LAD, significant LM and ost LCx dx; c. cath 08/07/2014 Synergy DES 2.5 mm x 18 mm to dist LM and ost LCx, rec lifelong DAPT  . Coronary artery disease involving coronary bypass graft 08/2014    all vein grafts occluded. Patent LIMA-LAD -- s/p PCI ot LM-Cx;   [name] Chronic combined systolic and diastolic CHF, NYHA class 2 ([place])     a. echo 08/2013: EF 40-45%, impaired relaxation,  mild MR, LA mildly dilated,    . Gallstone pancreatitis 2015    Tobacco Use: History  Smoking status  . Former Smoker  . Quit date: 02/07/2008  Smokeless tobacco  . 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 Participant demonstrates ability to understand and report RPE using BORG scale, to self-measure pulse accurately,  and to acknowledge the importance of the exercise prescription.  Exercise Prescription Goal: Starting with aerobic activity 30 plus minutes a day, 3 days per week for initial exercise prescription. Provide home exercise prescription and guidelines that participant acknowledges understanding prior to discharge.  Activity Barriers & Risk Stratification:     Activity Barriers & Risk Stratification - 08/24/14 1337    Activity Barriers & Risk Stratification   Activity Barriers None   Risk Stratification High      6 Minute Walk:     6 Minute Walk      08/24/14 1448 11/13/14 0843     6 Minute Walk   Phase Initial Discharge    Distance 1265 feet 1450 feet    Distance % Change  15 %    Walk Time 6 minutes 6 minutes    Resting HR 59 bpm 56 bpm    Resting BP 130/70 mmHg 126/62 mmHg    Max Ex. HR 100 bpm 95 bpm    Max Ex. BP 160/78 mmHg 100/50 mmHg    RPE 11 9    Symptoms Yes (comment) No    Comments Patient had chest pain at pain level of 2 during walk. He informed me of this pain after the walk was over and said that pain was relieved once he stopped walking.         Initial Exercise Prescription:     Initial Exercise Prescription - 08/24/14 1400    Date of Initial Exercise Prescription   Date 08/24/14   Treadmill   MPH 2.3   Grade 0   Minutes 15   Bike   Level 0.8   Minutes 15   Recumbant Bike   Level 2   RPM 40   Watts 20   Minutes 15   NuStep   Level 3   Watts 30   Minutes 15   Arm Ergometer   Level 1   Watts 10   Minutes 15   Arm/Foot Ergometer   Level 1   Watts 12   Minutes 15   Cybex   Level 1   RPM 40   Minutes 15   Recumbant Elliptical   Level 1   RPM 40   Watts 20   Minutes 15   Elliptical   Level 1   Speed 3   Minutes 5   REL-XR   Level 3   Watts 30   Minutes 15   Prescription Details   Frequency (times per week) 3   Duration Progress to 30 minutes of continuous aerobic without signs/symptoms of physical distress   Intensity  THRR REST +  30   Ratings of Perceived Exertion 11-15   Progression Continue progressive overload as per policy without signs/symptoms or physical distress.   Resistance Training   Training Prescription Yes   Weight 2   Reps 10-12      Exercise Prescription Changes:     Exercise Prescription Changes      08/31/14 0900 09/09/14 0900 09/15/14 0600 10/07/14 0800 10/20/14 0600   Exercise Review   Progression Yes Yes Yes Yes Yes   Response to Exercise   Blood Pressure (Admit)   132/78 mmHg 132/78 mmHg 136/64 mmHg   Blood Pressure (Exercise)   150/70 mmHg 150/70 mmHg 138/64 mmHg   Blood Pressure (Exit)   140/70 mmHg 140/70 mmHg 140/70 mmHg   Heart Rate (Admit)   78 bpm 78 bpm 74 bpm   Heart Rate (Exercise)   85 bpm 85 bpm 83 bpm   Heart Rate (Exit)   83 bpm 83 bpm 65 bpm   Rating of Perceived Exertion (Exercise)   '11 11 12   '$ Symptoms   Experiencing chest pain when walking on the treadmill. Allieviated with rest or stopping.  Experiencing chest pain when walking on the treadmill. Allieviated with rest or stopping.  Has angina with exercise, angina is relieved when exercise is stopped or slowed   Comments   MD cleared him to resume playing pickleball MD cleared him to resume playing pickleball MD cleared him to resume playing pickleball and to exercise with angina   Frequency   Add 1 additional day to program exercise sessions. Add 1 additional day to program exercise sessions. Add 1 additional day to program exercise sessions.   Duration Progress to 30 minutes of continuous aerobic without signs/symptoms of physical distress Progress to 30 minutes of continuous aerobic without signs/symptoms of physical distress Progress to 30 minutes of continuous aerobic without signs/symptoms of physical distress Progress to 30 minutes of continuous aerobic without signs/symptoms of physical distress Progress to 30 minutes of continuous aerobic without signs/symptoms of physical distress   Intensity Rest +  30 Rest + 30 Rest + 30 Rest + 30 Rest + 30   Progression Continue progressive overload as per policy without signs/symptoms or physical distress. Continue progressive overload as per policy without signs/symptoms or physical distress. Continue progressive overload as per policy without signs/symptoms or physical distress. Continue progressive overload as per policy without signs/symptoms or physical distress. Continue progressive overload as per policy without signs/symptoms or physical distress.   Resistance Training   Training Prescription Yes Yes Yes Yes Yes   Weight $Remov'2 3 4 4 6   'kXSEGb$ Reps 10-15 10-15 10-15 10-15 10-15   Interval Training   Interval Training No No No No No   Treadmill   MPH 2.4 2.4 2.7 2.7 2.7   Grade 0 0 0 0 0   Minutes $Remove'15 15 20 20 20   'PjNPVPI$ NuStep   Level   '6 6 6   '$ Watts   60 60 60   Minutes   '20 20 20   '$ REL-XR   Level   '6 6 6   '$ Watts   65 65 65   Minutes   '15 15 15     '$ 11/09/14 1000 11/10/14 0700 11/13/14 0800       Exercise Review   Progression Yes No  No increases due to symptoms and ER visit Yes     Response to Exercise   Blood Pressure (Admit)  152/70 mmHg  Blood Pressure (Exercise)  132/70 mmHg      Blood Pressure (Exit)  156/70 mmHg      Heart Rate (Admit)  69 bpm      Heart Rate (Exercise)  71 bpm      Heart Rate (Exit)  70 bpm      Rating of Perceived Exertion (Exercise)  11      Symptoms Has angina with exercise, angina is relieved when exercise is stopped or slowed Was experiencing chest pain and went to the ER; MDs approved [facility] for light exercise. None     Comments MD cleared him to resume playing pickleball and to exercise with angina ER discharge summary cleared [facility] for light exercise. All his workloads have been reduced MD cleared Perri to play pickleball and since ER visit he has not been having any cardiac symptoms. He has felt good the past 2 days in rehab. He improved by 15% on his post 6MW test.Patient is approaching graduation of the program  and home exercise plans were discussed. Details of the patient's exercise prescription and what they need to do in order to continue the prescription and progress with exercise were outlined and the patient verbalized understanding. The patient plans to complete all exercise at [facility] to play pickleball 4 days a week.      Frequency Add 1 additional day to program exercise sessions. Add 1 additional day to program exercise sessions. Add 1 additional day to program exercise sessions.     Duration Progress to 30 minutes of continuous aerobic without signs/symptoms of physical distress Progress to 30 minutes of continuous aerobic without signs/symptoms of physical distress Progress to 30 minutes of continuous aerobic without signs/symptoms of physical distress     Intensity Rest + 30 Rest + 30 Rest + 30     Progression Continue progressive overload as per policy without signs/symptoms or physical distress. Continue progressive overload as per policy without signs/symptoms or physical distress. Continue progressive overload as per policy without signs/symptoms or physical distress.     Resistance Training   Training Prescription Yes Yes Yes     Weight $Remov'6 6 6     'LdmwIc$ Reps 10-15 10-15 10-15     Interval Training   Interval Training No No No     Treadmill   MPH 2.7 2.7 2.7     Grade 0 0 0     Minutes $Remove'20 20 20     'AjvxwAD$ NuStep   Level $Remo'6 6 6     'QbuPz$ Watts 60 60 60     Minutes $Remove'20 20 20     'ZnWMxSS$ REL-XR   Level $Remo'6 6 6     'jmkJX$ Watts 65 65 65     Minutes $Remove'15 15 15     'MAOIvxf$ Home Exercise Plan   Plans to continue exercise at   [employer] (comment)  [facility] (pickleball)        Discharge Exercise Prescription (Final Exercise Prescription Changes):     Exercise Prescription Changes - 11/13/14 0800    Exercise Review   Progression Yes   Response to Exercise   Symptoms None   Comments MD cleared Jovonte to play pickleball and since ER visit he has not been having any cardiac  symptoms. He has felt good the past 2 days in rehab. He improved by 15% on his post 6MW test.Patient is approaching graduation of the program and home exercise plans were discussed. Details of the patient's exercise prescription  and what they need to do in order to continue the prescription and progress with exercise were outlined and the patient verbalized understanding. The patient plans to complete all exercise at [facility] and [facility] to play pickleball 4 days a week.    Frequency Add 1 additional day to program exercise sessions.   Duration Progress to 30 minutes of continuous aerobic without signs/symptoms of physical distress   Intensity Rest + 30   Progression Continue progressive overload as per policy without signs/symptoms or physical distress.   Resistance Training   Training Prescription Yes   Weight 6   Reps 10-15   Interval Training   Interval Training No   Treadmill   MPH 2.7   Grade 0   Minutes 20   NuStep   Level 6   Watts 60   Minutes 20   REL-XR   Level 6   Watts 65   Minutes 15   Home Exercise Plan   Plans to continue exercise at [employer] (comment)  [facility] (pickleball)      Nutrition:  Target Goals: Understanding of nutrition guidelines, daily intake of sodium '1500mg'$ , cholesterol '200mg'$ , calories 30% from fat and 7% or less from saturated fats, daily to have 5 or more servings of fruits and vegetables.  Biometrics:     Pre Biometrics - 08/24/14 1459    Pre Biometrics   Height $Remov'5\' 10"'BEwCKX$  (1.778 m)   Weight 223 lb (101.152 kg)   Waist Circumference 44.5 inches   Hip Circumference 41 inches   Waist to Hip Ratio 1.09 %   BMI (Calculated) 32.1         Post Biometrics - 11/13/14 0845     Post  Biometrics   Height $Remov'5\' 10"'pXtKKd$  (1.778 m)   Weight 203 lb 1.6 oz (92.126 kg)   Waist Circumference 42.5 inches   Hip Circumference 38.5 inches   Waist to Hip Ratio 1.1 %   BMI (Calculated) 29.2      Nutrition Therapy Plan  and Nutrition Goals:     Nutrition Therapy & Goals - 09/03/14 1151    Nutrition Therapy   Diet --  Mr. Delaguila does not wish to meet with dietitian at this time.      Nutrition Discharge: Rate Your Plate Scores:     Rate Your Plate - 35/68/61 6837    Rate Your Plate Scores   Post Score 73   Post Score % 81 %      Nutrition Goals Re-Evaluation:   Psychosocial: Target Goals: Acknowledge presence or absence of depression, maximize coping skills, provide positive support system. Participant is able to verbalize types and ability to use techniques and skills needed for reducing stress and depression.  Initial Review & Psychosocial Screening:     Initial Psych Review & Screening - 08/24/14 1358    Screening Interventions   Interventions Encouraged to exercise;Program counselor consult      Quality of Life Scores:     Quality of Life - 10/28/14 0656    Quality of Life Scores   Health/Function Pre 27.86 %   Health/Function Post 24.39 %   Health/Function % Change 12 %   Socioeconomic Pre 30 %   Socioeconomic Post 30 %   Socioeconomic % Change 0 %   Psych/Spiritual Pre 30 %   Psych/Spiritual Post 30 %   Psych/Spiritual % Change 0 %   Family Pre 30 %   Family Post 30 %   Family %  Change 0 %   GLOBAL Pre 29.03 %   GLOBAL Post 27.55 %   GLOBAL % Change 5 %      PHQ-9:     Recent Review Flowsheet Data    Depression screen [facility] 2/9 10/28/2014 08/24/2014   Decreased Interest 0 0   Down, Depressed, Hopeless 0 0   PHQ - 2 Score 0 0   Altered sleeping 0 0   Tired, decreased energy 0 0   Change in appetite 0 0   Feeling bad or failure about yourself  0 0   Trouble concentrating 0 0   Moving slowly or fidgety/restless 0 0   Suicidal thoughts 0 0   PHQ-9 Score 0 0      Psychosocial Evaluation and Intervention:     Psychosocial Evaluation - 08/31/14 1017    Psychosocial Evaluation & Interventions   Interventions Stress management education;Relaxation  education;Encouraged to exercise with the program and follow exercise prescription   Comments Counselor met with Mr. Ludlum today for initial psychosocial evaluation.  He is a 69 year old individual who had a stent inserted approximately 3 weeks ago.  He lives alone but has a strong support system with (2) adult daughters who live close by and he is actively involved in his local church.  Mr. Stai has other health issues including diabetes, and kidney failure.  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  He reports sleeping well and has a good appetite.  He denies current depressive symptoms, but admits he experienced some untreated depression when his spouse left 7 years ago.  Mr. Sipp is typically in a positive mood and reports minimal stress in his life currently.  His goals are to get his stamina and strength back in order to get back on the "pickle court" (like indoor tennis) as soon as possible.     Continued Psychosocial Services Needed Yes  Mr. Buckalew will benefit from all of the psychoeducational components of this program and consistent exercise.        Psychosocial Re-Evaluation:   Vocational Rehabilitation: Provide vocational rehab assistance to qualifying candidates.   Vocational Rehab Evaluation & Intervention:     Vocational Rehab - 08/24/14 1338    Initial Vocational Rehab Evaluation & Intervention   Assessment shows need for Vocational Rehabilitation No      Education: Education Goals: Education classes will be provided on a weekly basis, covering required topics. Participant will state understanding/return demonstration of topics presented.  Learning Barriers/Preferences:     Learning Barriers/Preferences - 08/24/14 1338    Learning Barriers/Preferences   Learning Barriers None   Learning Preferences None      Education Topics: General Nutrition Guidelines/Fats and Fiber: -Group instruction provided by verbal, written  material, models and posters to present the general guidelines for heart healthy nutrition. Gives an explanation and review of dietary fats and fiber.   Controlling Sodium/Reading Food Labels: -Group verbal and written material supporting the discussion of sodium use in heart healthy nutrition. Review and explanation with models, verbal and written materials for utilization of the food label.   Exercise Physiology & Risk Factors: - Group verbal and written instruction with models to review the exercise physiology of the cardiovascular system and associated critical values. Details cardiovascular disease risk factors and the goals associated with each risk factor.          Cardiac Rehab from 10/26/2014 in [facility] Cardiac Rehab   Date  10/14/14  Educator  RM   Instruction Review Code  2- meets goals/outcomes      Aerobic Exercise & Resistance Training: - Gives group verbal and written discussion on the health impact of inactivity. On the components of aerobic and resistive training programs and the benefits of this training and how to safely progress through these programs.   Flexibility, Balance, General Exercise Guidelines: - Provides group verbal and written instruction on the benefits of flexibility and balance training programs. Provides general exercise guidelines with specific guidelines to those with heart or lung disease. Demonstration and skill practice provided.      Cardiac Rehab from 10/26/2014 in [facility] Cardiac Rehab   Date  10/26/14   Educator  [facility]   Instruction Review Code  2- meets goals/outcomes      Stress Management: - Provides group verbal and written instruction about the health risks of elevated stress, cause of high stress, and healthy ways to reduce stress.   Depression: - Provides group verbal and written instruction on the correlation between heart/lung disease and depressed mood, treatment options, and the stigmas associated with seeking treatment.   Anatomy  & Physiology of the Heart: - Group verbal and written instruction and models provide basic cardiac anatomy and physiology, with the coronary electrical and arterial systems. Review of: AMI, Angina, Valve disease, Heart Failure, Cardiac Arrhythmia, Pacemakers, and the ICD.   Cardiac Procedures: - Group verbal and written instruction and models to describe the testing methods done to diagnose heart disease. Reviews the outcomes of the test results. Describes the treatment choices: Medical Management, Angioplasty, or Coronary Bypass Surgery.   Cardiac Medications: - Group verbal and written instruction to review commonly prescribed medications for heart disease. Reviews the medication, class of the drug, and side effects. Includes the steps to properly store meds and maintain the prescription regimen.   Go Sex-Intimacy & Heart Disease, Get SMART - Goal Setting: - Group verbal and written instruction through game format to discuss heart disease and the return to sexual intimacy. Provides group verbal and written material to discuss and apply goal setting through the application of the S.M.A.R.T. Method.   Other Matters of the Heart: - Provides group verbal, written materials and models to describe Heart Failure, Angina, Valve Disease, and Diabetes in the realm of heart disease. Includes description of the disease process and treatment options available to the cardiac patient.   Exercise & Equipment Safety: - Individual verbal instruction and demonstration of equipment use and safety with use of the equipment.      Cardiac Rehab from 10/26/2014 in [facility] Cardiac Rehab   Date  08/24/14   Educator  SB   Instruction Review Code  2- meets goals/outcomes      Infection Prevention: - Provides verbal and written material to individual with discussion of infection control including proper hand washing and proper equipment cleaning during exercise session.      Cardiac Rehab from 10/26/2014 in [facility]  Cardiac Rehab   Date  08/24/14   Educator  SB   Instruction Review Code  2- meets goals/outcomes      Falls Prevention: - Provides verbal and written material to individual with discussion of falls prevention and safety.      Cardiac Rehab from 10/26/2014 in [facility] Cardiac Rehab   Date  08/24/14   Educator  SB   Instruction Review Code  2- meets goals/outcomes      Diabetes: - Individual verbal and written instruction to review signs/symptoms of diabetes, desired ranges  of glucose level fasting, after meals and with exercise. Advice that pre and post exercise glucose checks will be done for 3 sessions at entry of program.      Cardiac Rehab from 10/26/2014 in [facility] Cardiac Rehab   Date  08/24/14   Educator  SB   Instruction Review Code  2- meets goals/outcomes       Knowledge Questionnaire Score:     Knowledge Questionnaire Score - 10/28/14 0654    Knowledge Questionnaire Score   Pre Score 26/28   Post Score 23/28      Personal Goals and Risk Factors at Admission:     Personal Goals and Risk Factors at Admission - 08/24/14 1348    Personal Goals and Risk Factors on Admission   Increase Aerobic Exercise and Physical Activity Yes   Intervention While in program, learn and follow the exercise prescription taught. Start at a low level workload and increase workload after able to maintain previous level for 30 minutes. Increase time before increasing intensity.   Diabetes Yes   Goal Blood glucose control identified by blood glucose values, HgbA1C. Participant verbalizes understanding of the signs/symptoms of hyper/hypo glycemia, proper foot care and importance of medication and nutrition plan for blood glucose control.   Intervention Provide nutrition & aerobic exercise along with prescribed medications to achieve blood glucose in normal ranges: Fasting 65-99 mg/dL   Hypertension Yes   Goal Participant will see blood pressure controlled within the values of 140/39mm/Hg or within  value directed by their physician.   Intervention Provide nutrition & aerobic exercise along with prescribed medications to achieve BP 140/90 or less.   Lipids Yes   Goal Cholesterol controlled with medications as prescribed, with individualized exercise RX and with personalized nutrition plan. Value goals: LDL < $Rem'70mg'oJlh$ , HDL > $Rem'40mg'PxRN$ . Participant states understanding of desired cholesterol values and following prescriptions.   Intervention Provide nutrition & aerobic exercise along with prescribed medications to achieve LDL '70mg'$ , HDL >$Remo'40mg'vrSJw$ .      Personal Goals and Risk Factors Review:      Goals and Risk Factor Review      08/26/14 1202 09/11/14 1207 10/14/14 1143 11/03/14 0801     Weight Management   Goals Progress/Improvement seen  Yes Yes No    Comments  Doing well with weight managment. A few pounds down since admission.  Is down 3 lbs since starting program. Wants to get down to 200. Has not seen much change in weight or body composition. Alistair blames this on his chest pain as that limits his effort.     Increase Aerobic Exercise and Physical Activity   Goals Progress/Improvement seen  No No Yes Yes    Comments Goals Comments: Had intermittent chest discomfort "not like my heart attack" after approx 2 minutes of exercising on the treadmill. 2.45mph  No incline. I called Dr. Donivan Scull office but they were unable to see him so recommended ER which I passed that info along. I called the [facility] office where patient is usually seen and they recommended Emerg Dept. Also. With rest [place] did not have any further chest pain. I left a vm for his daughter also after I asked his permission to call her.  COntinues to have angina symptoms, especially when on the treadmill. Has appointment this afternoon with doctor. WIll talk to his doctor about his recurrent symptoms.  Is able to do more exercise and also exercises 3d/wk that he doesn't come to class. His chest pain is still limiting him  a lot and his  doctor told him to exercise up to the angina threshold. He is still very frustrated with this.  Farah has been trying to push his ischemic threshold and increase his exercise time rather than increase intensity for a short time. This has helped to increase the amount of exercise he can do before iliciting symptoms. He is still unable to exercise at his desired intensity due to chest pain. He is meeting with his MD to have an echo and possible stress test to gain insight on this problem he has been having since his procedure.     Diabetes   Goal  Blood glucose control identified by blood glucose values, HgbA1C. Participant verbalizes understanding of the signs/symptoms of hyper/hypo glycemia, proper foot care and importance of medication and nutrition plan for blood glucose control.      Progress seen towards goals  Yes      Comments  States blood sugar levels are controlled      Hypertension   Goal  Participant will see blood pressure controlled within the values of 140/50mm/Hg or within value directed by their physician.      Progress seen toward goals  Yes  Yes    Comments  BP in good range  Blood pressure readings go down to acceptable ranges during exercise and sometimes after exercise. He stll comes into class with high blood pressure at times. The exercise is helping and is evident in the execise and post exercise  readings                                Abnormal Lipids   Goal  Cholesterol controlled with medications as prescribed, with individualized exercise RX and with personalized nutrition plan. Value goals: LDL < $Rem'70mg'oMxZ$ , HDL > $Rem'40mg'REjY$ . Participant states understanding of desired cholesterol values and following prescriptions.      Progress seen towards goals  Unknown      Comments  no lab values to compare         Personal Goals Discharge (Final Personal Goals and Risk Factors Review):      Goals and Risk Factor Review - 11/03/14 0801    Weight Management   Goals Progress/Improvement seen  No   Comments Has not seen much change in weight or body composition. [place] blames this on his chest pain as that limits his effort.    Increase Aerobic Exercise and Physical Activity   Goals Progress/Improvement seen  Yes   Comments Lucius has been trying to push his ischemic threshold and increase his exercise time rather than increase intensity for a short time. This has helped to increase the amount of exercise he can do before iliciting symptoms. He is still unable to exercise at his desired intensity due to chest pain. He is meeting with his MD to have an echo and possible stress test to gain insight on this problem he has been having since his procedure.    Hypertension   Progress seen toward goals Yes   Comments Blood pressure readings go down to acceptable ranges during exercise and sometimes after exercise. He stll comes into class with high blood pressure at times. The exercise is helping and is evident in the execise and post exercise  readings  Comments:  Patient completed 36/36 sessions.  Patient discharged from [facility] on October 7th, 2016.

## 2014-11-17 NOTE — Addendum Note (Signed)
Addended by: Felipe Drone on: 11/17/2014 03:19 PM   Modules accepted: Orders

## 2014-11-18 ENCOUNTER — Encounter: Payer: Self-pay | Admitting: *Deleted

## 2014-11-18 DIAGNOSIS — I214 Non-ST elevation (NSTEMI) myocardial infarction: Secondary | ICD-10-CM

## 2014-11-18 DIAGNOSIS — Z9861 Coronary angioplasty status: Secondary | ICD-10-CM

## 2014-11-18 NOTE — Progress Notes (Signed)
Cardiac Individual Treatment Plan  Patient Details  Name: Joseph [name] MRN: [id number] Date of Birth: 1945/09/20 Referring Provider:  No ref. provider found  Initial Encounter Date:    Visit Diagnosis: NSTEMI (non-ST elevated myocardial infarction) ([place])  S/P PTCA (percutaneous transluminal coronary angioplasty)  Patient's Home Medications on Admission:  Current outpatient prescriptions:  .  aspirin 81 MG tablet, Take 81 mg by mouth daily., Disp: , Rfl:  .  calcitRIOL (ROCALTROL) 0.25 MCG capsule, Take 0.25 mcg by mouth daily. , Disp: , Rfl:  .  carvedilol (COREG) 6.25 MG tablet, Take 6.25 mg by mouth 2 (two) times daily with a meal., Disp: , Rfl:  .  clopidogrel (PLAVIX) 75 MG tablet, Take 1 tablet (75 mg total) by mouth daily., Disp: 90 tablet, Rfl: 3 .  ferrous sulfate 325 (65 FE) MG EC tablet, Take 325 mg by mouth daily with breakfast. , Disp: , Rfl:  .  furosemide (LASIX) 40 MG tablet, Take 40 mg by mouth 2 (two) times daily., Disp: , Rfl:  .  hydrALAZINE (APRESOLINE) 50 MG tablet, Take 1 tablet (50 mg total) by mouth 2 (two) times daily., Disp: 60 tablet, Rfl: 5 .  isosorbide mononitrate (IMDUR) 60 MG 24 hr tablet, Take 1 tablet (60 mg total) by mouth 2 (two) times daily., Disp: 30 tablet, Rfl: 5 .  LANTUS SOLOSTAR 100 UNIT/ML Solostar Pen, Inject 20 Units into the skin at bedtime. , Disp: , Rfl:  .  LYRICA 75 MG capsule, Take 75 mg by mouth 2 (two) times daily. , Disp: , Rfl: 0 .  nitroGLYCERIN (NITROLINGUAL) 0.4 MG/SPRAY spray, Place 1 spray under the tongue every 5 (five) minutes x 3 doses as needed for chest pain. Do not use together with sublingual nitro, Disp: 12 g, Rfl: 5 .  ranolazine (RANEXA) 1000 MG SR tablet, Take 1 tablet (1,000 mg total) by mouth 2 (two) times daily., Disp: 60 tablet, Rfl: 6 .  rosuvastatin (CRESTOR) 20 MG tablet, Take 20 mg by mouth daily., Disp: , Rfl:  .  sodium bicarbonate 650 MG tablet, Take 2 tablets (1,300 mg total) by mouth 2 (two) times  daily., Disp: 120 tablet, Rfl: 5  Past Medical History: Past Medical History  Diagnosis Date  . Hypertension   . Hypercholesterolemia   . Prostate cancer ([place])   . Hepatosplenomegaly 2015    fibrosis, no cirrhosis on 06/2013 liver biopsy  . Thrombocytopenia ([place]) 2015  . Myocardial infarction ([place]) 2001  . Depression with anxiety   . IDDM (insulin dependent diabetes mellitus) ([place])     INSULIN DEPENDENT  . GERD (gastroesophageal reflux disease)   . History of shingles   . Ischemic cardiomyopathy     a. echo 08/06/2014: EF 25-30%, multiple WMA, mod DD, PASP 49 mm Hg   . CKD (chronic kidney disease), stage IV ([facility])     a. previously on dialysis; b. followed by Dr. Abigail Butts   . Coronary artery disease, occlusive     a. s/p 4v CABG 2001(LIMA-LAD, SVG-D2, SVG-OM1, SVG-right PDA, SVG-right PL1); b. cath 07/30/2014 vein grafts all down, patent LIMA to LAD, significant LM and ost LCx dx; c. cath 08/07/2014 Synergy DES 2.5 mm x 18 mm to dist LM and ost LCx, rec lifelong DAPT  . Coronary artery disease involving coronary bypass graft 08/2014    all vein grafts occluded. Patent LIMA-LAD -- s/p PCI ot LM-Cx;   [name] Chronic combined systolic and diastolic CHF, NYHA class 2 ([place])  a. echo 08/2013: EF 40-45%, impaired relaxation, mild MR, LA mildly dilated,    . Gallstone pancreatitis 2015    Tobacco Use: History  Smoking status  . Former Smoker  . Quit date: 02/07/2008  Smokeless tobacco  . 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 Participant demonstrates ability to understand and  report RPE using BORG scale, to self-measure pulse accurately, and to acknowledge the importance of the exercise prescription.  Exercise Prescription Goal: Starting with aerobic activity 30 plus minutes a day, 3 days per week for initial exercise prescription. Provide home exercise prescription and guidelines that participant acknowledges understanding prior to discharge.  Activity Barriers & Risk Stratification:     Activity Barriers & Risk Stratification - 08/24/14 1337    Activity Barriers & Risk Stratification   Activity Barriers None   Risk Stratification High      6 Minute Walk:     6 Minute Walk      08/24/14 1448 11/13/14 0843     6 Minute Walk   Phase Initial Discharge    Distance 1265 feet 1450 feet    Distance % Change  15 %    Walk Time 6 minutes 6 minutes    Resting HR 59 bpm 56 bpm    Resting BP 130/70 mmHg 126/62 mmHg    Max Ex. HR 100 bpm 95 bpm    Max Ex. BP 160/78 mmHg 100/50 mmHg    RPE 11 9    Symptoms Yes (comment) No    Comments Patient had chest pain at pain level of 2 during walk. He informed me of this pain after the walk was over and said that pain was relieved once he stopped walking.         Initial Exercise Prescription:     Initial Exercise Prescription - 08/24/14 1400    Date of Initial Exercise Prescription   Date 08/24/14   Treadmill   MPH 2.3   Grade 0   Minutes 15   Bike   Level 0.8   Minutes 15   Recumbant Bike   Level 2   RPM 40   Watts 20   Minutes 15   NuStep   Level 3   Watts 30   Minutes 15   Arm Ergometer   Level 1   Watts 10   Minutes 15   Arm/Foot Ergometer   Level 1   Watts 12   Minutes 15   Cybex   Level 1   RPM 40   Minutes 15   Recumbant Elliptical   Level 1   RPM 40   Watts 20   Minutes 15   Elliptical   Level 1   Speed 3   Minutes 5   REL-XR   Level 3   Watts 30   Minutes 15   Prescription Details   Frequency (times per week) 3   Duration Progress to 30 minutes of continuous aerobic  without signs/symptoms of  physical distress   Intensity   THRR REST +  30   Ratings of Perceived Exertion 11-15   Progression Continue progressive overload as per policy without signs/symptoms or physical distress.   Resistance Training   Training Prescription Yes   Weight 2   Reps 10-12      Exercise Prescription Changes:     Exercise Prescription Changes      08/31/14 0900 09/09/14 0900 09/15/14 0600 10/07/14 0800 10/20/14 0600   Exercise Review   Progression _0    Response to Exercise   Blood Pressure (Admit)   132/78 mmHg 132/78 mmHg 136/64 mmHg   Blood Pressure (Exercise)   150/70 mmHg 150/70 mmHg 138/64 mmHg   Blood Pressure (Exit)   140/70 mmHg 140/70 mmHg 140/70 mmHg   Heart Rate (Admit)   78 bpm 78 bpm 74 bpm   Heart Rate (Exercise)   85 bpm 85 bpm 83 bpm   Heart Rate (Exit)   83 bpm 83 bpm 65 bpm   Rating of Perceived Exertion (Exercise)   _1 Symptoms   Experiencing chest pain when walking on the treadmill. Allieviated with rest or stopping.  Experiencing chest pain when walking on the treadmill. Allieviated with rest or stopping.  Has angina with exercise, angina is relieved when exercise is stopped or slowed   Comments   MD cleared him to resume playing pickleball MD cleared him to resume playing pickleball MD cleared him to resume playing pickleball and to exercise with angina   Frequency   Add 1 additional day to program exercise sessions. Add 1 additional day to program exercise sessions. Add 1 additional day to program exercise sessions.   Duration Progress to 30 minutes of continuous aerobic without signs/symptoms of physical distress Progress to 30 minutes of continuous aerobic without signs/symptoms of physical distress Progress to 30 minutes of continuous aerobic without signs/symptoms of physical distress Progress to 30 minutes of continuous aerobic without signs/symptoms of physical distress Progress to 30 minutes of continuous aerobic  without signs/symptoms of physical distress   Intensity Rest + 30 Rest + 30 Rest + 30 Rest + 30 Rest + 30   Progression Continue progressive overload as per policy without signs/symptoms or physical distress. Continue progressive overload as per policy without signs/symptoms or physical distress. Continue progressive overload as per policy without signs/symptoms or physical distress. Continue progressive overload as per policy without signs/symptoms or physical distress. Continue progressive overload as per policy without signs/symptoms or physical distress.   Resistance Training   Training Prescription _2    Weight _3 Reps 10-15 10-15 10-15 10-15 10-15   Interval Training   Interval Training _4    Treadmill   MPH 2.4 2.4 2.7 2.7 2.7   Grade 0 0 0 0 0   Minutes _5 NuStep   Level   _6 Watts   60 60 60   Minutes   _7 REL-XR   Level   _8 Watts   65 65 65   Minutes   _9 11/09/14 1000 11/10/14 0700 11/13/14 0800       Exercise Review   Progression Yes No  No increases due to symptoms and ER visit Yes     Response to Exercise   Blood Pressure (  Admit)  152/70 mmHg      Blood Pressure (Exercise)  132/70 mmHg      Blood Pressure (Exit)  156/70 mmHg      Heart Rate (Admit)  69 bpm      Heart Rate (Exercise)  71 bpm      Heart Rate (Exit)  70 bpm      Rating of Perceived Exertion (Exercise)  11      Symptoms Has angina with exercise, angina is relieved when exercise is stopped or slowed Was experiencing chest pain and went to the ER; MDs approved [facility] for light exercise. None     Comments MD cleared him to resume playing pickleball and to exercise with angina ER discharge summary cleared [facility] for light exercise. All his workloads have been reduced MD cleared Axell to play pickleball and since ER visit he has not been having any cardiac symptoms. He has felt good the past 2 days in rehab. He improved by 15% on his  post 6MW test.Patient is approaching graduation of the program and home exercise plans were discussed. Details of the patient's exercise prescription and what they need to do in order to continue the prescription and progress with exercise were outlined and the patient verbalized understanding. The patient plans to complete all exercise at [facility] and [facility] to play pickleball 4 days a week.      Frequency Add 1 additional day to program exercise sessions. Add 1 additional day to program exercise sessions. Add 1 additional day to program exercise sessions.     Duration Progress to 30 minutes of continuous aerobic without signs/symptoms of physical distress Progress to 30 minutes of continuous aerobic without signs/symptoms of physical distress Progress to 30 minutes of continuous aerobic without signs/symptoms of physical distress     Intensity Rest + 30 Rest + 30 Rest + 30     Progression Continue progressive overload as per policy without signs/symptoms or physical distress. Continue progressive overload as per policy without signs/symptoms or physical distress. Continue progressive overload as per policy without signs/symptoms or physical distress.     Resistance Training   Training Prescription Yes Yes Yes     Weight _0 Reps 10-15 10-15 10-15     Interval Training   Interval Training No No No     Treadmill   MPH 2.7 2.7 2.7     Grade 0 0 0     Minutes _1 NuStep   Level _2 Watts 60 60 60     Minutes _3 REL-XR   Level _4 Watts 65 65 65     Minutes _5 Home Exercise Plan   Plans to continue exercise at   [employer] (comment)  [facility] (pickleball)        Discharge Exercise Prescription (Final Exercise Prescription Changes):     Exercise Prescription Changes - 11/13/14 0800    Exercise Review   Progression Yes   Response to Exercise   Symptoms None   Comments MD cleared Dugan to play  pickleball and since ER visit he has not been having any cardiac symptoms. He has felt good the past 2 days in rehab. He improved by 15% on his post 6MW test.Patient is approaching graduation of the program and home exercise  plans were discussed. Details of the patient's exercise prescription and what they need to do in order to continue the prescription and progress with exercise were outlined and the patient verbalized understanding. The patient plans to complete all exercise at [facility] and [facility] to play pickleball 4 days a week.    Frequency Add 1 additional day to program exercise sessions.   Duration Progress to 30 minutes of continuous aerobic without signs/symptoms of physical distress   Intensity Rest + 30   Progression Continue progressive overload as per policy without signs/symptoms or physical distress.   Resistance Training   Training Prescription Yes   Weight 6   Reps 10-15   Interval Training   Interval Training No   Treadmill   MPH 2.7   Grade 0   Minutes 20   NuStep   Level 6   Watts 60   Minutes 20   REL-XR   Level 6   Watts 65   Minutes 15   Home Exercise Plan   Plans to continue exercise at [employer] (comment)  [facility] (pickleball)      Nutrition:  Target Goals: Understanding of nutrition guidelines, daily intake of sodium <1547m, cholesterol <2053m calories 30% from fat and 7% or less from saturated fats, daily to have 5 or more servings of fruits and vegetables.  Biometrics:     Pre Biometrics - 08/24/14 1459    Pre Biometrics   Height _0  (1.778 m)   Weight 223 lb (101.152 kg)   Waist Circumference 44.5 inches   Hip Circumference 41 inches   Waist to Hip Ratio 1.09 %   BMI (Calculated) 32.1         Post Biometrics - 11/13/14 0845     Post  Biometrics   Height _1  (1.778 m)   Weight 203 lb 1.6 oz (92.126 kg)   Waist Circumference 42.5 inches   Hip Circumference 38.5 inches   Waist to Hip Ratio  1.1 %   BMI (Calculated) 29.2      Nutrition Therapy Plan and Nutrition Goals:     Nutrition Therapy & Goals - 09/03/14 1151    Nutrition Therapy   Diet --  Mr. RoMcleesoes not wish to meet with dietitian at this time.      Nutrition Discharge: Rate Your Plate Scores:     Rate Your Plate - 0997/35/3209924  Rate Your Plate Scores   Post Score 73   Post Score % 81 %      Nutrition Goals Re-Evaluation:   Psychosocial: Target Goals: Acknowledge presence or absence of depression, maximize coping skills, provide positive support system. Participant is able to verbalize types and ability to use techniques and skills needed for reducing stress and depression.  Initial Review & Psychosocial Screening:     Initial Psych Review & Screening - 08/24/14 1358    Screening Interventions   Interventions Encouraged to exercise;Program counselor consult      Quality of Life Scores:     Quality of Life - 10/28/14 0656    Quality of Life Scores   Health/Function Pre 27.86 %   Health/Function Post 24.39 %   Health/Function % Change 12 %   Socioeconomic Pre 30 %   Socioeconomic Post 30 %   Socioeconomic % Change 0 %   Psych/Spiritual Pre 30 %   Psych/Spiritual Post 30 %   Psych/Spiritual % Change 0 %   Family Pre 30 %  Family Post 30 %   Family % Change 0 %   GLOBAL Pre 29.03 %   GLOBAL Post 27.55 %   GLOBAL % Change 5 %      PHQ-9:     Recent Review Flowsheet Data    Depression screen [facility] 2/9 10/28/2014 08/24/2014   Decreased Interest 0 0   Down, Depressed, Hopeless 0 0   PHQ - 2 Score 0 0   Altered sleeping 0 0   Tired, decreased energy 0 0   Change in appetite 0 0   Feeling bad or failure about yourself  0 0   Trouble concentrating 0 0   Moving slowly or fidgety/restless 0 0   Suicidal thoughts 0 0   PHQ-9 Score 0 0      Psychosocial Evaluation and Intervention:     Psychosocial Evaluation - 08/31/14 1017    Psychosocial Evaluation & Interventions    Interventions Stress management education;Relaxation education;Encouraged to exercise with the program and follow exercise prescription   Comments Counselor met with Mr. Tomes today for initial psychosocial evaluation.  He is a 69 year old individual who had a stent inserted approximately 3 weeks ago.  He lives alone but has a strong support system with (2) adult daughters who live close by and he is actively involved in his local church.  Mr. Meuth has other health issues including diabetes, and kidney failure.  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  He reports sleeping well and has a good appetite.  He denies current depressive symptoms, but admits he experienced some untreated depression when his spouse left 7 years ago.  Mr. Winnett is typically in a positive mood and reports minimal stress in his life currently.  His goals are to get his stamina and strength back in order to get back on the "pickle court" (like indoor tennis) as soon as possible.     Continued Psychosocial Services Needed Yes  Mr. Licausi will benefit from all of the psychoeducational components of this program and consistent exercise.        Psychosocial Re-Evaluation:   Vocational Rehabilitation: Provide vocational rehab assistance to qualifying candidates.   Vocational Rehab Evaluation & Intervention:     Vocational Rehab - 08/24/14 1338    Initial Vocational Rehab Evaluation & Intervention   Assessment shows need for Vocational Rehabilitation No      Education: Education Goals: Education classes will be provided on a weekly basis, covering required topics. Participant will state understanding/return demonstration of topics presented.  Learning Barriers/Preferences:     Learning Barriers/Preferences - 08/24/14 1338    Learning Barriers/Preferences   Learning Barriers None   Learning Preferences None      Education Topics: General Nutrition Guidelines/Fats and  Fiber: -Group instruction provided by verbal, written material, models and posters to present the general guidelines for heart healthy nutrition. Gives an explanation and review of dietary fats and fiber.   Controlling Sodium/Reading Food Labels: -Group verbal and written material supporting the discussion of sodium use in heart healthy nutrition. Review and explanation with models, verbal and written materials for utilization of the food label.   Exercise Physiology & Risk Factors: - Group verbal and written instruction with models to review the exercise physiology of the cardiovascular system and associated critical values. Details cardiovascular disease risk factors and the goals associated with each risk factor.          Cardiac Rehab from 10/26/2014 in  [facility]   Date  10/14/14   Educator  RM   Instruction Review Code  2- meets goals/outcomes      Aerobic Exercise & Resistance Training: - Gives group verbal and written discussion on the health impact of inactivity. On the components of aerobic and resistive training programs and the benefits of this training and how to safely progress through these programs.   Flexibility, Balance, General Exercise Guidelines: - Provides group verbal and written instruction on the benefits of flexibility and balance training programs. Provides general exercise guidelines with specific guidelines to those with heart or lung disease. Demonstration and skill practice provided.      Cardiac Rehab from 10/26/2014 in [facility]   Date  10/26/14   Educator  [facility]   Instruction Review Code  2- meets goals/outcomes      Stress Management: - Provides group verbal and written instruction about the health risks of elevated stress, cause of high stress, and healthy ways to reduce stress.   Depression: - Provides group verbal and written instruction on the correlation between heart/lung disease and depressed mood, treatment options, and the  stigmas associated with seeking treatment.   Anatomy & Physiology of the Heart: - Group verbal and written instruction and models provide basic cardiac anatomy and physiology, with the coronary electrical and arterial systems. Review of: AMI, Angina, Valve disease, Heart Failure, Cardiac Arrhythmia, Pacemakers, and the ICD.   Cardiac Procedures: - Group verbal and written instruction and models to describe the testing methods done to diagnose heart disease. Reviews the outcomes of the test results. Describes the treatment choices: Medical Management, Angioplasty, or Coronary Bypass Surgery.   Cardiac Medications: - Group verbal and written instruction to review commonly prescribed medications for heart disease. Reviews the medication, class of the drug, and side effects. Includes the steps to properly store meds and maintain the prescription regimen.   Go Sex-Intimacy & Heart Disease, Get SMART - Goal Setting: - Group verbal and written instruction through game format to discuss heart disease and the return to sexual intimacy. Provides group verbal and written material to discuss and apply goal setting through the application of the S.M.A.R.T. Method.   Other Matters of the Heart: - Provides group verbal, written materials and models to describe Heart Failure, Angina, Valve Disease, and Diabetes in the realm of heart disease. Includes description of the disease process and treatment options available to the cardiac patient.   Exercise & Equipment Safety: - Individual verbal instruction and demonstration of equipment use and safety with use of the equipment.      Cardiac Rehab from 10/26/2014 in [facility]   Date  08/24/14   Educator  SB   Instruction Review Code  2- meets goals/outcomes      Infection Prevention: - Provides verbal and written material to individual with discussion of infection control including proper hand washing and proper equipment cleaning during exercise  session.      Cardiac Rehab from 10/26/2014 in [facility]   Date  08/24/14   Educator  SB   Instruction Review Code  2- meets goals/outcomes      Falls Prevention: - Provides verbal and written material to individual with discussion of falls prevention and safety.      Cardiac Rehab from 10/26/2014 in [facility]   Date  08/24/14   Educator  SB   Instruction Review Code  2- meets goals/outcomes      Diabetes: - Individual verbal  and written instruction to review signs/symptoms of diabetes, desired ranges of glucose level fasting, after meals and with exercise. Advice that pre and post exercise glucose checks will be done for 3 sessions at entry of program.      Cardiac Rehab from 10/26/2014 in [facility]   Date  08/24/14   Educator  SB   Instruction Review Code  2- meets goals/outcomes       Knowledge Questionnaire Score:     Knowledge Questionnaire Score - 10/28/14 0654    Knowledge Questionnaire Score   Pre Score 26/28   Post Score 23/28      Personal Goals and Risk Factors at Admission:     Personal Goals and Risk Factors at Admission - 08/24/14 1348    Personal Goals and Risk Factors on Admission   Increase Aerobic Exercise and Physical Activity Yes   Intervention While in program, learn and follow the exercise prescription taught. Start at a low level workload and increase workload after able to maintain previous level for 30 minutes. Increase time before increasing intensity.   Diabetes Yes   Goal Blood glucose control identified by blood glucose values, HgbA1C. Participant verbalizes understanding of the signs/symptoms of hyper/hypo glycemia, proper foot care and importance of medication and nutrition plan for blood glucose control.   Intervention Provide nutrition & aerobic exercise along with prescribed medications to achieve blood glucose in normal ranges: Fasting 65-99 mg/dL   Hypertension Yes   Goal Participant will see blood pressure  controlled within the values of 140/67m/Hg or within value directed by their physician.   Intervention Provide nutrition & aerobic exercise along with prescribed medications to achieve BP 140/90 or less.   Lipids Yes   Goal Cholesterol controlled with medications as prescribed, with individualized exercise RX and with personalized nutrition plan. Value goals: LDL < 75m HDL > 4082mParticipant states understanding of desired cholesterol values and following prescriptions.   Intervention Provide nutrition & aerobic exercise along with prescribed medications to achieve LDL <69m27mDL >40mg66m   Personal Goals and Risk Factors Review:      Goals and Risk Factor Review      08/26/14 1202 09/11/14 1207 10/14/14 1143 11/03/14 0801     Weight Management   Goals Progress/Improvement seen  Yes Yes No    Comments  Doing well with weight managment. A few pounds down since admission.  Is down 3 lbs since starting program. Wants to get down to 200. Has not seen much change in weight or body composition. HoyleHulenes this on his chest pain as that limits his effort.     Increase Aerobic Exercise and Physical Activity   Goals Progress/Improvement seen  No No Yes Yes    Comments Goals Comments: Had intermittent chest discomfort "not like my heart attack" after approx 2 minutes of exercising on the treadmill. 2.2mph 71m incline. I called Dr. GollanDonivan Sculle but they were unable to see him so recommended ER which I passed that info along. I called [facility] where patient [facility] and they recommended Emerg Dept. Also. With rest Izell Tuliaot have any further chest pain. I left a vm for his daughter also after I asked his permission to call her.  COntinues to have angina symptoms, especially when on the treadmill. Has appointment this afternoon with doctor. WIll talk to his doctor about his recurrent symptoms.  Is able to do more exercise and also exercises 3d/wk that he doesn't  come to class.  His chest pain is still limiting him a lot and his doctor told him to exercise up to the angina threshold. He is still very frustrated with this.  Rainer has been trying to push his ischemic threshold and increase his exercise time rather than increase intensity for a short time. This has helped to increase the amount of exercise he can do before iliciting symptoms. He is still unable to exercise at his desired intensity due to chest pain. He is meeting with his MD to have an echo and possible stress test to gain insight on this problem he has been having since his procedure.     Diabetes   Goal  Blood glucose control identified by blood glucose values, HgbA1C. Participant verbalizes understanding of the signs/symptoms of hyper/hypo glycemia, proper foot care and importance of medication and nutrition plan for blood glucose control.      Progress seen towards goals  Yes      Comments  States blood sugar levels are controlled      Hypertension   Goal  Participant will see blood pressure controlled within the values of 140/22m/Hg or within value directed by their physician.      Progress seen toward goals  Yes  Yes    Comments  BP in good range  Blood pressure readings go down to acceptable ranges during exercise and sometimes after exercise. He stll comes into class with high blood pressure at times. The exercise is helping and is evident in the execise and post exercise  readings                                Abnormal Lipids   Goal  Cholesterol controlled with medications as prescribed, with individualized exercise RX and with personalized nutrition plan. Value goals: LDL < 753m HDL > 406mParticipant states understanding of desired cholesterol values and following prescriptions.      Progress seen towards goals  Unknown      Comments  no lab values to compare         Personal Goals Discharge (Final Personal Goals and Risk Factors Review):      Goals and Risk Factor Review - 11/03/14 0801     Weight Management   Goals Progress/Improvement seen No   Comments Has not seen much change in weight or body composition. HoyBertholdames this on his chest pain as that limits his effort.    Increase Aerobic Exercise and Physical Activity   Goals Progress/Improvement seen  Yes   Comments HoyTeryls been trying to push his ischemic threshold and increase his exercise time rather than increase intensity for a short time. This has helped to increase the amount of exercise he can do before iliciting symptoms. He is still unable to exercise at his desired intensity due to chest pain. He is meeting with his MD to have an echo and possible stress test to gain insight on this problem he has been having since his procedure.    Hypertension   Progress seen toward goals Yes   Comments Blood pressure readings go down to acceptable ranges during exercise and sometimes after exercise. He stll comes into class with high blood pressure at times. The exercise is helping and is evident in the execise and post exercise  readings  Comments: Crimson was back in the hospital earlier in the month.

## 2014-11-24 ENCOUNTER — Inpatient Hospital Stay: Payer: Medicare Other | Attending: Family Medicine

## 2014-12-03 ENCOUNTER — Encounter: Payer: Self-pay | Admitting: Cardiology

## 2014-12-04 ENCOUNTER — Telehealth: Payer: Self-pay | Admitting: Internal Medicine

## 2014-12-04 NOTE — Telephone Encounter (Signed)
[facility]  Paged by the pt regarding CP that started 1 hour after eating dinner tonight.  He was unsure if it is indigestion but feels similar to his prior angina.  It did not resolve with belching or NTG x 1.  He is attempting a second dose now & will plan to report to the ED in [place], [place] where he is currently visiting if it does not improve.  Given his high risk risk remote CABG & numerous subsequent PCI's, I agree with this plan.  This is his first episode of CP following his discharge from [facility] for a hospitalization 11/03/14 to 11/08/14 for CP.  Frann Rider, MD

## 2014-12-05 NOTE — Telephone Encounter (Signed)
OK - we may need to help them with a few notes if he does go to the hospital down there.  Leonie Man, MD

## 2014-12-05 NOTE — Telephone Encounter (Signed)
I am not sure what you mean by notes.  I a moonlighter, so I didn't want this to get lost in translation.  Frann Rider, MD

## 2014-12-06 ENCOUNTER — Telehealth: Payer: Self-pay | Admitting: Physician Assistant

## 2014-12-06 NOTE — Telephone Encounter (Signed)
The patient was hospitalized in [facility] while on a trip.  SOB and mildly elevated troponin.   Records faxed.  His daughter will call for an appt when she knows he is being discharged.    Tarri Fuller PAC

## 2014-12-09 ENCOUNTER — Encounter: Payer: Self-pay | Admitting: Physician Assistant

## 2014-12-09 ENCOUNTER — Ambulatory Visit (INDEPENDENT_AMBULATORY_CARE_PROVIDER_SITE_OTHER): Payer: Medicare Other | Admitting: Physician Assistant

## 2014-12-09 VITALS — BP 130/68 | HR 62 | Ht 71.0 in | Wt 206.4 lb

## 2014-12-09 DIAGNOSIS — I25709 Atherosclerosis of coronary artery bypass graft(s), unspecified, with unspecified angina pectoris: Secondary | ICD-10-CM

## 2014-12-09 DIAGNOSIS — I255 Ischemic cardiomyopathy: Secondary | ICD-10-CM | POA: Diagnosis not present

## 2014-12-09 MED ORDER — TICAGRELOR 90 MG PO TABS: 90.0000 mg | ORAL_TABLET | Freq: Two times a day (BID) | ORAL | Status: DC

## 2014-12-09 MED ORDER — ISOSORBIDE MONONITRATE ER 60 MG PO TB24: 90.0000 mg | ORAL_TABLET | Freq: Every day | ORAL | Status: DC

## 2014-12-09 MED ORDER — CARVEDILOL 6.25 MG PO TABS: 9.7500 mg | ORAL_TABLET | Freq: Two times a day (BID) | ORAL | Status: DC

## 2014-12-09 NOTE — Patient Instructions (Addendum)
Your physician has recommended you make the following change in your medication:   INCREASE CARVEDILOL TO 9.375MG  TWICE A DAY  CHANGE ISOSORBIDE TO 90MG  IN THE MORNINGS ONLY  Your physician recommends that you schedule a follow-up appointment in: [place] DR. HARDING

## 2014-12-09 NOTE — Progress Notes (Signed)
Patient ID: Joseph [name], male   DOB: [birth year], 69 y.o.   MRN: [id number]    Date:  12/09/2014   ID:  Joseph [name], DOB [birth year], MRN [id number]  PCP:  Marden Noble, MD  Primary Cardiologist:  Ellyn Hack  Chief Complaint  Patient presents with  . Follow-up    post hosp/ heart attack last friday//pt c/o chest dullness, dizziness, and SOB when he was lifting something heavy     History of Present Illness: Joseph [name] is a 69 y.o. male with PMH of CAD s/p CABG '01 at [facility] (LIMA-LAD, SVG-D2, SVG-OM1, SVG-right PDA, SVG-right PL1) along with multiple PCIs, ISM/chronic systolic CHF (EF 24-82% 5/00), stage IV CKD (has had dialysis previously), chronic thrombocytopenia, T2DM, prostate cancer.  He had a cardiac catheterization by Dr. Humphrey Rolls in [facility] 07/30/14 revealing all vein grafts occluded, (known dating to '09), patent LIMA to LAD, 70% ostial left main lesion, 70% ostial LCx, occluded RCA. He underwent PCI by Dr. Gwenlyn Found 08/07/14 for critical left main and ostial LCx at 90% and had synergy DES to both. He had some chest pain on 7/20 at cardiac rehab (palpitations, PACs, PAT and bardycardia but no VT on 48 hor holter) leading to observation and negative cardiac markers, elevated d-dimer and VQ performed low risk for PE and ranexa restarted. He had some chest pain during other cardiac rehab and had follow-up 8/5 with uptitration of hydralazine and ranolazine.  He presented with left-sided chest pain and s/s volume overload  On 11/03/2014.  He was diuresed with IV Lasix. IV nitroglycerin was continued as well as aspirin, Coreg, Plavix, Crestor, ranolazine. Patient had an acute drop in hemoglobin reports 8.7 to 7.8 despite diuresing 1 L at the time. He was transfused 1 unit of PRBCs. GI consult at and he was heme-negative at the time of exam, however, he ended having Hemoccult-positive stool. Hemoglobin was trending up, so they recommended outpatient evaluation. The patient's troponin  was trending up and peaked at 6.92. Is difficult to know if the patient had a primary NSTEMI or if it was demand ischemia. However, he did very well with cardiac rehabilitation and ambulated 2000 feet with no chest pain. 2-D echocardiogram revealed ejection fraction of 30-35% with moderate diffuse hypokinesis. There was akinesis scarring of the inferior lateral and inferior myocardium. Left atrium was severely dilated. Right ventricle was moderately dilated. Right atrium was mildly dilated.  He was also seen in consult by Dr. Caryl Comes with EP and was not considered a candidate for ICD implantation at this time but could reconsider in 3-6 months. He was also seen by nephrology worsening chronic kidney disease who thought he likely had a component of hypoperfusion related to his heart failure on top of his progressively worsening chronic kidney disease. Dr. Jimmy Footman recommended keeping his blood pressure slightly higher than had been. Bicarbonate was increased to 1300 mg twice a day. His Lasix was later increased to 120 mg twice daily which would be his discharge dosing and was reviewed with Dr. Justin Mend. Kayexalate was not resumed at discharge. Potassium has been on the low side during hospitalization. He was weaned off IV nitroglycerin and Imdur was changed to 60 mg twice a day.  Patient was hospitalized down in [place] over the weekend and he is now here for follow-up. He reports developing about 4 out of 10 chest pain while he was down there. He was also short of breath and was given IV Lasix for diuresis. He was started on  IV heparin and nitroglycerin. His chest pain resolved his troponin increased to 0.79 he underwent another echocardiogram which revealed ejection fraction proximal 40% left atrium was mildly dilated, mild mitral regurgitation mild tricuspid regurgitation, right ventricular systolic pressure was 25.0 mmHg. LV was mildly dilated.  Hemoglobin was stable in [place] at 8.5.  He reports today that  he lifted a 50 pound bag of dog food and developed dull chest pain with an intensity of 1/10.  It quickly resolved.  The patient was in [place] for vacation and during our discussion it sounds like he has not been particularly compliant with his diet. It sounds as though he's been relatively high in sodium due to eating out.  The patient currently denies nausea, vomiting, fever, shortness of breath, orthopnea, dizziness, PND, cough, congestion, abdominal pain, hematochezia, melena, lower extremity edema, claudication.  Wt Readings from Last 3 Encounters:  12/09/14 206 lb 6.4 oz (93.622 kg)  11/13/14 205 lb 6.4 oz (93.169 kg)  11/13/14 203 lb 1.6 oz (92.126 kg)    Lipid Panel     Component Value Date/Time   CHOL 113 11/04/2014 0120   CHOL 97 04/25/2013 0532   TRIG 168* 11/04/2014 0120   TRIG 113 04/25/2013 0532   HDL 26* 11/04/2014 0120   HDL 33* 04/25/2013 0532   CHOLHDL 4.3 11/04/2014 0120   VLDL 34 11/04/2014 0120   VLDL 23 04/25/2013 0532   LDLCALC 53 11/04/2014 0120   LDLCALC 41 04/25/2013 0532      cardiac catheterization 08/06/2014,  Pre-stenting critical left main and circumflex           Past Medical History  Diagnosis Date  . Hypertension   . Hypercholesterolemia   . Prostate cancer ([place])   . Hepatosplenomegaly 2015    fibrosis, no cirrhosis on 06/2013 liver biopsy  . Thrombocytopenia ([place]) 2015  . Myocardial infarction ([place]) 2001  . Depression with anxiety   . IDDM (insulin dependent diabetes mellitus) ([place])     INSULIN DEPENDENT  . GERD (gastroesophageal reflux disease)   . History of shingles   . Ischemic cardiomyopathy     a. echo 08/06/2014: EF 25-30%, multiple WMA, mod DD, PASP 49 mm Hg   . CKD (chronic kidney disease), stage IV ([facility])     a. previously on dialysis; b. followed by Dr. Abigail Butts   . Coronary artery disease, occlusive     a. s/p 4v CABG 2001(LIMA-LAD, SVG-D2, SVG-OM1, SVG-right PDA, SVG-right PL1); b. cath 07/30/2014 vein grafts all down,  patent LIMA to LAD, significant LM and ost LCx dx; c. cath 08/07/2014 Synergy DES 2.5 mm x 18 mm to dist LM and ost LCx, rec lifelong DAPT  . Coronary artery disease involving coronary bypass graft 08/2014    all vein grafts occluded. Patent LIMA-LAD -- s/p PCI ot LM-Cx;   [name] Chronic combined systolic and diastolic CHF, NYHA class 2 ([place])     a. echo 08/2013: EF 40-45%, impaired relaxation, mild MR, LA mildly dilated,    . Gallstone pancreatitis 2015    Current Outpatient Prescriptions  Medication Sig Dispense Refill  . aspirin 81 MG tablet Take 81 mg by mouth daily.    . calcitRIOL (ROCALTROL) 0.25 MCG capsule Take 0.25 mcg by mouth daily.     . carvedilol (COREG) 6.25 MG tablet Take 6.25 mg by mouth 2 (two) times daily with a meal.    . ferrous sulfate 325 (65 FE) MG EC tablet Take 325 mg by mouth daily with breakfast.     .  furosemide (LASIX) 40 MG tablet Take 40 mg by mouth 2 (two) times daily.    . hydrALAZINE (APRESOLINE) 50 MG tablet Take 1 tablet (50 mg total) by mouth 2 (two) times daily. 60 tablet 5  . isosorbide mononitrate (IMDUR) 60 MG 24 hr tablet Take 1 tablet (60 mg total) by mouth 2 (two) times daily. 30 tablet 5  . LANTUS SOLOSTAR 100 UNIT/ML Solostar Pen Inject 20 Units into the skin at bedtime.     [name] LYRICA 75 MG capsule Take 75 mg by mouth 2 (two) times daily.   0  . nitroGLYCERIN (NITROLINGUAL) 0.4 MG/SPRAY spray Place 1 spray under the tongue every 5 (five) minutes x 3 doses as needed for chest pain. Do not use together with sublingual nitro 12 g 5  . ranolazine (RANEXA) 1000 MG SR tablet Take 1 tablet (1,000 mg total) by mouth 2 (two) times daily. 60 tablet 6  . rosuvastatin (CRESTOR) 20 MG tablet Take 20 mg by mouth daily.    . sodium bicarbonate 650 MG tablet Take 2 tablets (1,300 mg total) by mouth 2 (two) times daily. 120 tablet 5  . ticagrelor (BRILINTA) 90 MG TABS tablet Take 90 mg by mouth 2 (two) times daily.     No current facility-administered medications for  this visit.    Allergies:    Allergies  Allergen Reactions  . Sulfa Antibiotics Itching    hives  . Morphine And Related     Makes patient feel weird    Social History:  The patient  reports that he quit smoking about 6 years ago. He has quit using smokeless tobacco. He reports that he does not drink alcohol or use illicit drugs.   Family history:   Family History  Problem Relation Age of Onset  . Acute myelogenous leukemia Brother   . CAD Brother   . Diabetes Mellitus II Brother   . Heart disease Father 53    CABG    ROS:  Please see the history of present illness.  All other systems reviewed and negative.   PHYSICAL EXAM: VS:  BP 130/68 mmHg  Pulse 62  Ht 5\' 11"  (1.803 m)  Wt 206 lb 6.4 oz (93.622 kg)  BMI 28.80 kg/m2 Well nourished, well developed, in no acute distress HEENT: Pupils are equal round react to light accommodation extraocular movements are intact.  Neck: Positive hepatojugular refluxNo cervical lymphadenopathy. Cardiac: Regular rate and rhythm with 2/6 systolic murmur Lungs:  Mild crackles left lower base Abd: soft, nontender, positive bowel sounds all quadrants, no hepatosplenomegaly Ext: Trace lower extremity edema.  2+ radial and dorsalis pedis pulses. Skin: warm and dry Neuro:  Grossly normal  EKG:  Sinus rhythm. Rate 62 bpm. First degree AV block. Frequent PVCs. Inferior Q waves.  Anterior lateral ST depression which is not new    ASSESSMENT AND PLAN:  Coronary artery disease This early possible that his troponin elevation in [place] was related to volume overload. They did treat him with IV Lasix. His troponin increased to 0.79 the setting of chronic kidney disease with a serum creatinine 5.70.  I do not think there is any rush to take him to the Cath Lab at this time.  Patient continues to have some mild, stable angina. It occurred this morning when lifting a 50 pound bag of dog food and quickly resolved.  I will increase his Coreg to 9.375 twice  daily.  He was supposed to be taking imdur 60 mg twice a day  however, he was only taking 60 and 30.  I'm going to change it to once daily 90 mg.  We'll continue Ranexa 1000 twice a day.  Plavix was changed to Brilinta when he was in [place].    Patient had 1 stent card. On it was written "left main"  2.25 mm x 8 mm. Cardiac cath note indicates 1 stent 2.5 mm x 18 mm.  Chronic systolic heart failure He appears to have some mild volume overload at this time but denies shortness of breath, orthopnea, PND.  We had a discussion regarding low-sodium diet and daily weight monitoring.  It appears he is only up 1-3 pounds based on the previous weights.  Continue current dose of Lasix. He is also on imdur and hydralazine.   Chronic kidney disease stage IV  Kidney function appear stable used on labs from [place].   PVCs:  Increase Coreg to 9.375 twice daily.  Chronic thrombocytopenia  Stable 5.7 in [place]. Chronic anemia:  Hemoglobin has been stable  Hyperlipidemia: Continue Crestor. Last LDL was in September and was 9

## 2014-12-11 ENCOUNTER — Inpatient Hospital Stay: Payer: Medicare Other | Attending: Family Medicine | Admitting: *Deleted

## 2014-12-11 ENCOUNTER — Telehealth: Payer: Self-pay

## 2014-12-11 ENCOUNTER — Other Ambulatory Visit: Payer: Self-pay

## 2014-12-11 ENCOUNTER — Other Ambulatory Visit: Payer: Self-pay | Admitting: *Deleted

## 2014-12-11 DIAGNOSIS — D696 Thrombocytopenia, unspecified: Secondary | ICD-10-CM

## 2014-12-11 DIAGNOSIS — D472 Monoclonal gammopathy: Secondary | ICD-10-CM

## 2014-12-11 LAB — COMPREHENSIVE METABOLIC PANEL
ALT: 14 U/L — AB (ref 17–63)
AST: 20 U/L (ref 15–41)
Albumin: 3.4 g/dL — ABNORMAL LOW (ref 3.5–5.0)
Alkaline Phosphatase: 77 U/L (ref 38–126)
Anion gap: 11 (ref 5–15)
BILIRUBIN TOTAL: 0.9 mg/dL (ref 0.3–1.2)
BUN: 106 mg/dL — ABNORMAL HIGH (ref 6–20)
CHLORIDE: 99 mmol/L — AB (ref 101–111)
CO2: 23 mmol/L (ref 22–32)
Calcium: 7.6 mg/dL — ABNORMAL LOW (ref 8.9–10.3)
Creatinine, Ser: 6.69 mg/dL — ABNORMAL HIGH (ref 0.61–1.24)
GFR, EST AFRICAN AMERICAN: 9 mL/min — AB (ref >60)
GFR, EST NON AFRICAN AMERICAN: 8 mL/min — AB (ref >60)
Glucose, Bld: 158 mg/dL — ABNORMAL HIGH (ref 65–99)
POTASSIUM: 3.5 mmol/L (ref 3.5–5.1)
Sodium: 133 mmol/L — ABNORMAL LOW (ref 135–145)
TOTAL PROTEIN: 7.9 g/dL (ref 6.5–8.1)

## 2014-12-11 LAB — CBC WITH DIFFERENTIAL/PLATELET
Basophils Absolute: 0 10*3/uL (ref 0–0.1)
Basophils Relative: 1 %
EOS PCT: 1 %
Eosinophils Absolute: 0.1 10*3/uL (ref 0–0.7)
HEMATOCRIT: 27.2 % — AB (ref 40.0–52.0)
Hemoglobin: 9 g/dL — ABNORMAL LOW (ref 13.0–18.0)
LYMPHS ABS: 0.5 10*3/uL — AB (ref 1.0–3.6)
LYMPHS PCT: 8 %
MCH: 28.1 pg (ref 26.0–34.0)
MCHC: 33 g/dL (ref 32.0–36.0)
MCV: 85.1 fL (ref 80.0–100.0)
MONO ABS: 0.4 10*3/uL (ref 0.2–1.0)
Monocytes Relative: 8 %
Neutro Abs: 4.6 10*3/uL (ref 1.4–6.5)
Neutrophils Relative %: 82 %
PLATELETS: 93 10*3/uL — AB (ref 150–440)
RBC: 3.2 MIL/uL — ABNORMAL LOW (ref 4.40–5.90)
RDW: 16.9 % — AB (ref 11.5–14.5)
WBC: 5.7 10*3/uL (ref 3.8–10.6)

## 2014-12-11 MED ORDER — TICAGRELOR 90 MG PO TABS: 90.0000 mg | ORAL_TABLET | Freq: Two times a day (BID) | ORAL | Status: AC

## 2014-12-11 NOTE — Telephone Encounter (Signed)
Prior auth for Brilinta 90mg  sent to [facility].

## 2014-12-13 LAB — KAPPA/LAMBDA LIGHT CHAINS
Kappa free light chain: 186.54 mg/L — ABNORMAL HIGH (ref 3.30–19.40)
Kappa, lambda light chain ratio: 4.1 — ABNORMAL HIGH (ref 0.26–1.65)
Lambda free light chains: 45.48 mg/L — ABNORMAL HIGH (ref 5.71–26.30)

## 2014-12-14 ENCOUNTER — Telehealth: Payer: Self-pay

## 2014-12-14 LAB — PROTEIN ELECTROPHORESIS, SERUM
A/G Ratio: 0.9 (ref 0.7–1.7)
ALBUMIN ELP: 3.4 g/dL (ref 2.9–4.4)
Alpha-1-Globulin: 0.3 g/dL (ref 0.0–0.4)
Alpha-2-Globulin: 1 g/dL (ref 0.4–1.0)
Beta Globulin: 0.9 g/dL (ref 0.7–1.3)
GLOBULIN, TOTAL: 3.7 g/dL (ref 2.2–3.9)
Gamma Globulin: 1.5 g/dL (ref 0.4–1.8)
M-Spike, %: 1.2 g/dL — ABNORMAL HIGH
TOTAL PROTEIN ELP: 7.1 g/dL (ref 6.0–8.5)

## 2014-12-14 NOTE — Telephone Encounter (Signed)
Fax with approval of Brilinta 90 mg from [facility]. Good through 03/14/2015.

## 2014-12-23 ENCOUNTER — Inpatient Hospital Stay
Admission: EM | Admit: 2014-12-23 | Discharge: 2014-12-28 | DRG: 193 | Disposition: A | Discharge status: Other Acute Inpatient Hospital | Payer: Medicare Other | Attending: Internal Medicine | Admitting: Internal Medicine

## 2014-12-23 ENCOUNTER — Encounter: Payer: Self-pay | Admitting: Emergency Medicine

## 2014-12-23 ENCOUNTER — Emergency Department: Payer: Medicare Other

## 2014-12-23 DIAGNOSIS — F418 Other specified anxiety disorders: Secondary | ICD-10-CM | POA: Diagnosis present

## 2014-12-23 DIAGNOSIS — N186 End stage renal disease: Secondary | ICD-10-CM | POA: Diagnosis present

## 2014-12-23 DIAGNOSIS — Z9889 Other specified postprocedural states: Secondary | ICD-10-CM | POA: Diagnosis not present

## 2014-12-23 DIAGNOSIS — E872 Acidosis: Secondary | ICD-10-CM | POA: Diagnosis present

## 2014-12-23 DIAGNOSIS — Z806 Family history of leukemia: Secondary | ICD-10-CM | POA: Diagnosis not present

## 2014-12-23 DIAGNOSIS — I255 Ischemic cardiomyopathy: Secondary | ICD-10-CM | POA: Diagnosis present

## 2014-12-23 DIAGNOSIS — Z8249 Family history of ischemic heart disease and other diseases of the circulatory system: Secondary | ICD-10-CM | POA: Diagnosis not present

## 2014-12-23 DIAGNOSIS — I272 Other secondary pulmonary hypertension: Secondary | ICD-10-CM | POA: Diagnosis present

## 2014-12-23 DIAGNOSIS — J9601 Acute respiratory failure with hypoxia: Secondary | ICD-10-CM | POA: Diagnosis present

## 2014-12-23 DIAGNOSIS — I248 Other forms of acute ischemic heart disease: Secondary | ICD-10-CM | POA: Diagnosis present

## 2014-12-23 DIAGNOSIS — Z951 Presence of aortocoronary bypass graft: Secondary | ICD-10-CM | POA: Diagnosis not present

## 2014-12-23 DIAGNOSIS — I5022 Chronic systolic (congestive) heart failure: Secondary | ICD-10-CM | POA: Diagnosis present

## 2014-12-23 DIAGNOSIS — E1122 Type 2 diabetes mellitus with diabetic chronic kidney disease: Secondary | ICD-10-CM | POA: Diagnosis present

## 2014-12-23 DIAGNOSIS — I081 Rheumatic disorders of both mitral and tricuspid valves: Secondary | ICD-10-CM | POA: Diagnosis present

## 2014-12-23 DIAGNOSIS — R7989 Other specified abnormal findings of blood chemistry: Secondary | ICD-10-CM | POA: Diagnosis not present

## 2014-12-23 DIAGNOSIS — J159 Unspecified bacterial pneumonia: Principal | ICD-10-CM | POA: Diagnosis present

## 2014-12-23 DIAGNOSIS — Z79899 Other long term (current) drug therapy: Secondary | ICD-10-CM | POA: Diagnosis not present

## 2014-12-23 DIAGNOSIS — I252 Old myocardial infarction: Secondary | ICD-10-CM | POA: Diagnosis not present

## 2014-12-23 DIAGNOSIS — Z955 Presence of coronary angioplasty implant and graft: Secondary | ICD-10-CM

## 2014-12-23 DIAGNOSIS — I132 Hypertensive heart and chronic kidney disease with heart failure and with stage 5 chronic kidney disease, or end stage renal disease: Secondary | ICD-10-CM | POA: Diagnosis present

## 2014-12-23 DIAGNOSIS — Z833 Family history of diabetes mellitus: Secondary | ICD-10-CM

## 2014-12-23 DIAGNOSIS — J181 Lobar pneumonia, unspecified organism: Secondary | ICD-10-CM

## 2014-12-23 DIAGNOSIS — Z87891 Personal history of nicotine dependence: Secondary | ICD-10-CM

## 2014-12-23 DIAGNOSIS — Z8546 Personal history of malignant neoplasm of prostate: Secondary | ICD-10-CM

## 2014-12-23 DIAGNOSIS — D631 Anemia in chronic kidney disease: Secondary | ICD-10-CM | POA: Diagnosis present

## 2014-12-23 DIAGNOSIS — I5042 Chronic combined systolic (congestive) and diastolic (congestive) heart failure: Secondary | ICD-10-CM | POA: Diagnosis present

## 2014-12-23 DIAGNOSIS — I951 Orthostatic hypotension: Secondary | ICD-10-CM | POA: Diagnosis present

## 2014-12-23 DIAGNOSIS — E785 Hyperlipidemia, unspecified: Secondary | ICD-10-CM | POA: Diagnosis present

## 2014-12-23 DIAGNOSIS — Z794 Long term (current) use of insulin: Secondary | ICD-10-CM | POA: Diagnosis not present

## 2014-12-23 DIAGNOSIS — Z885 Allergy status to narcotic agent status: Secondary | ICD-10-CM

## 2014-12-23 DIAGNOSIS — Z8619 Personal history of other infectious and parasitic diseases: Secondary | ICD-10-CM

## 2014-12-23 DIAGNOSIS — N2581 Secondary hyperparathyroidism of renal origin: Secondary | ICD-10-CM | POA: Diagnosis present

## 2014-12-23 DIAGNOSIS — I25118 Atherosclerotic heart disease of native coronary artery with other forms of angina pectoris: Secondary | ICD-10-CM | POA: Diagnosis present

## 2014-12-23 DIAGNOSIS — R0602 Shortness of breath: Secondary | ICD-10-CM

## 2014-12-23 DIAGNOSIS — Z452 Encounter for adjustment and management of vascular access device: Secondary | ICD-10-CM

## 2014-12-23 DIAGNOSIS — D696 Thrombocytopenia, unspecified: Secondary | ICD-10-CM | POA: Diagnosis present

## 2014-12-23 DIAGNOSIS — R918 Other nonspecific abnormal finding of lung field: Secondary | ICD-10-CM | POA: Diagnosis present

## 2014-12-23 DIAGNOSIS — K219 Gastro-esophageal reflux disease without esophagitis: Secondary | ICD-10-CM | POA: Diagnosis present

## 2014-12-23 DIAGNOSIS — Z7982 Long term (current) use of aspirin: Secondary | ICD-10-CM

## 2014-12-23 DIAGNOSIS — Z882 Allergy status to sulfonamides status: Secondary | ICD-10-CM

## 2014-12-23 DIAGNOSIS — Z992 Dependence on renal dialysis: Secondary | ICD-10-CM

## 2014-12-23 DIAGNOSIS — J189 Pneumonia, unspecified organism: Secondary | ICD-10-CM | POA: Diagnosis present

## 2014-12-23 LAB — BASIC METABOLIC PANEL
ANION GAP: 15 (ref 5–15)
BUN: 131 mg/dL — ABNORMAL HIGH (ref 6–20)
CO2: 23 mmol/L (ref 22–32)
CREATININE: 8.66 mg/dL — AB (ref 0.61–1.24)
Calcium: 8.1 mg/dL — ABNORMAL LOW (ref 8.9–10.3)
Chloride: 98 mmol/L — ABNORMAL LOW (ref 101–111)
GFR calc non Af Amer: 6 mL/min — ABNORMAL LOW (ref >60)
GFR, EST AFRICAN AMERICAN: 7 mL/min — AB (ref >60)
Glucose, Bld: 170 mg/dL — ABNORMAL HIGH (ref 65–99)
Potassium: 4.9 mmol/L (ref 3.5–5.1)
SODIUM: 136 mmol/L (ref 135–145)

## 2014-12-23 LAB — CBC
HCT: 24.4 % — ABNORMAL LOW (ref 40.0–52.0)
HCT: 22.5 % — ABNORMAL LOW (ref 40.0–52.0)
HEMOGLOBIN: 7.5 g/dL — AB (ref 13.0–18.0)
Hemoglobin: 8.1 g/dL — ABNORMAL LOW (ref 13.0–18.0)
MCH: 28.1 pg (ref 26.0–34.0)
MCH: 28.2 pg (ref 26.0–34.0)
MCHC: 33.3 g/dL (ref 32.0–36.0)
MCHC: 33.2 g/dL (ref 32.0–36.0)
MCV: 84.4 fL (ref 80.0–100.0)
MCV: 85 fL (ref 80.0–100.0)
PLATELETS: 90 10*3/uL — AB (ref 150–440)
Platelets: 77 10*3/uL — ABNORMAL LOW (ref 150–440)
RBC: 2.89 MIL/uL — ABNORMAL LOW (ref 4.40–5.90)
RBC: 2.65 MIL/uL — ABNORMAL LOW (ref 4.40–5.90)
RDW: 16.4 % — AB (ref 11.5–14.5)
RDW: 16.1 % — ABNORMAL HIGH (ref 11.5–14.5)
WBC: 6 10*3/uL (ref 3.8–10.6)
WBC: 4.5 10*3/uL (ref 3.8–10.6)

## 2014-12-23 LAB — LACTIC ACID, PLASMA
LACTIC ACID, VENOUS: 0.9 mmol/L (ref 0.5–2.0)
LACTIC ACID, VENOUS: 0.9 mmol/L (ref 0.5–2.0)

## 2014-12-23 LAB — URINALYSIS COMPLETE WITH MICROSCOPIC (ARMC ONLY)
BILIRUBIN URINE: NEGATIVE
Bacteria, UA: NONE SEEN
Glucose, UA: NEGATIVE mg/dL
Hgb urine dipstick: NEGATIVE
KETONES UR: NEGATIVE mg/dL
Leukocytes, UA: NEGATIVE
Nitrite: NEGATIVE
PROTEIN: 100 mg/dL — AB
SQUAMOUS EPITHELIAL / LPF: NONE SEEN
Specific Gravity, Urine: 1.011 (ref 1.005–1.030)
pH: 5 (ref 5.0–8.0)

## 2014-12-23 LAB — TROPONIN I
TROPONIN I: 0.08 ng/mL — AB (ref <0.031)
Troponin I: 0.11 ng/mL — ABNORMAL HIGH (ref <0.031)
Troponin I: 0.09 ng/mL — ABNORMAL HIGH (ref <0.031)

## 2014-12-23 LAB — CREATININE, SERUM
CREATININE: 8.61 mg/dL — AB (ref 0.61–1.24)
GFR calc Af Amer: 7 mL/min — ABNORMAL LOW (ref >60)
GFR calc non Af Amer: 6 mL/min — ABNORMAL LOW (ref >60)

## 2014-12-23 LAB — GLUCOSE, CAPILLARY: GLUCOSE-CAPILLARY: 246 mg/dL — AB (ref 65–99)

## 2014-12-23 MED ORDER — LEVOFLOXACIN IN D5W 750 MG/150ML IV SOLN
750.0000 mg | Freq: Once | INTRAVENOUS | Status: AC
  Administered 2014-12-23: 750 mg via INTRAVENOUS
  Filled 2014-12-23: qty 150

## 2014-12-23 MED ORDER — SODIUM CHLORIDE 0.9 % IV SOLN
INTRAVENOUS | Status: DC
  Administered 2014-12-23 – 2014-12-25 (×5): via INTRAVENOUS

## 2014-12-23 MED ORDER — ONDANSETRON HCL 4 MG/2ML IJ SOLN
4.0000 mg | Freq: Four times a day (QID) | INTRAMUSCULAR | Status: DC | PRN
Start: 2014-12-23 — End: 2014-12-28

## 2014-12-23 MED ORDER — TICAGRELOR 90 MG PO TABS
90.0000 mg | ORAL_TABLET | Freq: Two times a day (BID) | ORAL | Status: DC
  Administered 2014-12-23 – 2014-12-28 (×10): 90 mg via ORAL
  Filled 2014-12-23 (×10): qty 1

## 2014-12-23 MED ORDER — ALPRAZOLAM 0.5 MG PO TABS
0.5000 mg | ORAL_TABLET | ORAL | Status: DC | PRN
  Administered 2014-12-24 – 2014-12-27 (×4): 0.5 mg via ORAL
  Filled 2014-12-23 (×4): qty 1

## 2014-12-23 MED ORDER — IPRATROPIUM-ALBUTEROL 0.5-2.5 (3) MG/3ML IN SOLN
3.0000 mL | Freq: Four times a day (QID) | RESPIRATORY_TRACT | Status: DC
  Administered 2014-12-23 – 2014-12-24 (×4): 3 mL via RESPIRATORY_TRACT
  Filled 2014-12-23 (×4): qty 3

## 2014-12-23 MED ORDER — NITROGLYCERIN 0.4 MG SL SUBL
0.4000 mg | SUBLINGUAL_TABLET | SUBLINGUAL | Status: DC | PRN
  Administered 2014-12-25 (×2): 0.4 mg via SUBLINGUAL
  Filled 2014-12-23: qty 1
  Filled 2014-12-23 (×2): qty 25

## 2014-12-23 MED ORDER — INSULIN ASPART 100 UNIT/ML ~~LOC~~ SOLN: 0.0000 [IU] | Freq: Every day | SUBCUTANEOUS | Status: DC

## 2014-12-23 MED ORDER — ZOLPIDEM TARTRATE 5 MG PO TABS
5.0000 mg | ORAL_TABLET | Freq: Every evening | ORAL | Status: DC | PRN
  Administered 2014-12-25 – 2014-12-26 (×2): 5 mg via ORAL
  Filled 2014-12-23 (×3): qty 1

## 2014-12-23 MED ORDER — BACITRACIN ZINC 500 UNIT/GM EX OINT
TOPICAL_OINTMENT | CUTANEOUS | Status: AC
  Administered 2014-12-23: 1 via TOPICAL
  Filled 2014-12-23: qty 2.7

## 2014-12-23 MED ORDER — INSULIN ASPART 100 UNIT/ML ~~LOC~~ SOLN
0.0000 [IU] | Freq: Three times a day (TID) | SUBCUTANEOUS | Status: DC
  Administered 2014-12-23: 3 [IU] via SUBCUTANEOUS
  Administered 2014-12-24: 2 [IU] via SUBCUTANEOUS
  Administered 2014-12-25: 3 [IU] via SUBCUTANEOUS
  Administered 2014-12-25: 1 [IU] via SUBCUTANEOUS
  Administered 2014-12-26: 2 [IU] via SUBCUTANEOUS
  Administered 2014-12-27: 1 [IU] via SUBCUTANEOUS
  Filled 2014-12-23: qty 3
  Filled 2014-12-23 (×2): qty 2
  Filled 2014-12-23: qty 1
  Filled 2014-12-23: qty 3
  Filled 2014-12-23: qty 1

## 2014-12-23 MED ORDER — LEVOFLOXACIN 500 MG PO TABS
500.0000 mg | ORAL_TABLET | ORAL | Status: DC
  Administered 2014-12-25: 500 mg via ORAL
  Filled 2014-12-23: qty 1

## 2014-12-23 MED ORDER — ROSUVASTATIN CALCIUM 10 MG PO TABS
20.0000 mg | ORAL_TABLET | Freq: Every day | ORAL | Status: DC
  Administered 2014-12-23 – 2014-12-27 (×5): 20 mg via ORAL
  Filled 2014-12-23 (×3): qty 1
  Filled 2014-12-23 (×2): qty 2
  Filled 2014-12-23: qty 1

## 2014-12-23 MED ORDER — BACITRACIN ZINC 500 UNIT/GM EX OINT
TOPICAL_OINTMENT | Freq: Two times a day (BID) | CUTANEOUS | Status: DC
  Administered 2014-12-23: 1 via TOPICAL
  Administered 2014-12-23 – 2014-12-24 (×2): 15.5556 via TOPICAL
  Administered 2014-12-25: 09:00:00 via TOPICAL
  Administered 2014-12-25: 15.5556 via TOPICAL
  Administered 2014-12-26: 21:00:00 via TOPICAL
  Administered 2014-12-26 – 2014-12-28 (×4): 15.5556 via TOPICAL
  Filled 2014-12-23: qty 28.35

## 2014-12-23 MED ORDER — CALCITRIOL 0.25 MCG PO CAPS
0.2500 ug | ORAL_CAPSULE | Freq: Every day | ORAL | Status: DC
  Administered 2014-12-23 – 2014-12-28 (×6): 0.25 ug via ORAL
  Filled 2014-12-23 (×6): qty 1

## 2014-12-23 MED ORDER — RANOLAZINE ER 500 MG PO TB12
1000.0000 mg | ORAL_TABLET | Freq: Two times a day (BID) | ORAL | Status: DC
  Administered 2014-12-23 – 2014-12-24 (×2): 1000 mg via ORAL
  Filled 2014-12-23 (×2): qty 2

## 2014-12-23 MED ORDER — FERROUS SULFATE 325 (65 FE) MG PO TABS
325.0000 mg | ORAL_TABLET | Freq: Every day | ORAL | Status: DC
  Administered 2014-12-24 – 2014-12-28 (×5): 325 mg via ORAL
  Filled 2014-12-23 (×5): qty 1

## 2014-12-23 MED ORDER — ASPIRIN EC 81 MG PO TBEC
81.0000 mg | DELAYED_RELEASE_TABLET | Freq: Every day | ORAL | Status: DC
  Administered 2014-12-24 – 2014-12-28 (×5): 81 mg via ORAL
  Filled 2014-12-23 (×5): qty 1

## 2014-12-23 MED ORDER — LEVOFLOXACIN 750 MG PO TABS
750.0000 mg | ORAL_TABLET | ORAL | Status: DC
  Administered 2014-12-23: 750 mg via ORAL
  Filled 2014-12-23: qty 1

## 2014-12-23 MED ORDER — ENOXAPARIN SODIUM 30 MG/0.3ML ~~LOC~~ SOLN
30.0000 mg | SUBCUTANEOUS | Status: DC
  Administered 2014-12-23: 30 mg via SUBCUTANEOUS
  Filled 2014-12-23: qty 0.3

## 2014-12-23 MED ORDER — PREGABALIN 75 MG PO CAPS
75.0000 mg | ORAL_CAPSULE | Freq: Two times a day (BID) | ORAL | Status: DC
  Administered 2014-12-23 – 2014-12-24 (×2): 75 mg via ORAL
  Filled 2014-12-23 (×2): qty 1

## 2014-12-23 MED ORDER — SODIUM CHLORIDE 0.9 % IV BOLUS (SEPSIS)
1000.0000 mL | Freq: Once | INTRAVENOUS | Status: AC
  Administered 2014-12-23: 1000 mL via INTRAVENOUS

## 2014-12-23 MED ORDER — INSULIN GLARGINE 100 UNIT/ML ~~LOC~~ SOLN
20.0000 [IU] | Freq: Every day | SUBCUTANEOUS | Status: DC
  Administered 2014-12-25 – 2014-12-27 (×3): 20 [IU] via SUBCUTANEOUS
  Filled 2014-12-23 (×6): qty 0.2

## 2014-12-23 MED ORDER — SODIUM BICARBONATE 650 MG PO TABS
1300.0000 mg | ORAL_TABLET | Freq: Two times a day (BID) | ORAL | Status: DC
  Administered 2014-12-23 – 2014-12-26 (×6): 1300 mg via ORAL
  Filled 2014-12-23 (×6): qty 2

## 2014-12-23 MED ORDER — ACETAMINOPHEN 325 MG PO TABS
650.0000 mg | ORAL_TABLET | Freq: Four times a day (QID) | ORAL | Status: DC | PRN
  Administered 2014-12-27: 650 mg via ORAL
  Filled 2014-12-23: qty 2

## 2014-12-23 NOTE — ED Notes (Signed)
Pt here via [facility], reports 2 syncopal episodes this AM, reprots MI in June and had stents placed, has been on high dose of betablockers since then. Initial BP 80/60 in PCP office, pt denies pain.

## 2014-12-23 NOTE — ED Provider Notes (Signed)
[facility] Emergency Department Provider Note  ____________________________________________  Time seen: 9:45 AM on arrival by EMS  I have reviewed the triage vital signs and the nursing notes.   HISTORY  Chief Complaint Loss of Consciousness and Hypotension    HPI Joseph [name] is a 69 y.o. male brought to the ED for syncope and hypotension. Patient has been having a productive cough for the past 3 or 4 days. This morning as he was leaving the house to go to his doctor's office, he had a syncopal event in the parking lot. He then got to his doctor's office where he had 2 more syncopal episodes. Blood pressure on arrival by EMS was 80/60. Patient has a history of hypertension and CHF.Patient denies preceding chest pain numbness tingling weakness vision changes headache abdominal pain or back pain.     Past Medical History  Diagnosis Date  . Hypertension   . Hypercholesterolemia   . Prostate cancer ([place])   . Hepatosplenomegaly 2015    fibrosis, no cirrhosis on 06/2013 liver biopsy  . Thrombocytopenia ([place]) 2015  . Myocardial infarction ([place]) 2001  . Depression with anxiety   . IDDM (insulin dependent diabetes mellitus) ([place])     INSULIN DEPENDENT  . GERD (gastroesophageal reflux disease)   . History of shingles   . Ischemic cardiomyopathy     a. echo 08/06/2014: EF 25-30%, multiple WMA, mod DD, PASP 49 mm Hg   . CKD (chronic kidney disease), stage IV (HCC)     a. previously on dialysis; b. followed by Dr. Abigail Butts   . Coronary artery disease, occlusive     a. s/p 4v CABG 2001(LIMA-LAD, SVG-D2, SVG-OM1, SVG-right PDA, SVG-right PL1); b. cath 07/30/2014 vein grafts all down, patent LIMA to LAD, significant LM and ost LCx dx; c. cath 08/07/2014 Synergy DES 2.5 mm x 18 mm to dist LM and ost LCx, rec lifelong DAPT  . Coronary artery disease involving coronary bypass graft 08/2014    all vein grafts occluded. Patent LIMA-LAD -- s/p PCI ot LM-Cx;   [name] Chronic  combined systolic and diastolic CHF, NYHA class 2 ([place])     a. echo 08/2013: EF 40-45%, impaired relaxation, mild MR, LA mildly dilated,    . Gallstone pancreatitis 2015     Patient Active Problem List   Diagnosis Date Noted  . Acute renal failure superimposed on stage 4 chronic kidney disease ([place]) 11/06/2014  . 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 Stable angina ([place]) 10/16/2014  . Essential hypertension 08/27/2014  . CAD s/p CABG 2001, prior POBA, PCI/DES July 2016 08/27/2014  . Ischemic cardiomyopathy 08/18/2014  . NSTEMI (non-ST elevated myocardial infarction) ([place]) 08/05/2014  . Gall stones 12/09/2013  . Hepatic cirrhosis ([place]) 07/14/2013  . Heart failure ([place]) 07/14/2013  . Portal hypertension ([place]) 07/14/2013  . Hx of CABG 2001 04/02/2013  . Type 2 diabetes mellitus with renal manifestations ([place]) 04/02/2013  . Anemia 04/02/2013  . Thrombocytopenia ([place]) 04/02/2013  . Acute on chronic systolic CHF (congestive heart failure) ([place]) 04/02/2013  . Arteriosclerosis of coronary artery 04/02/2013  . Chronic kidney disease 04/02/2013  . Atherosclerosis of coronary artery 04/02/2013  . Type 2 diabetes mellitus ([place]) 04/02/2013     Past Surgical History  Procedure Laterality Date  . Cardiac surgery    . Shoulder arthroscopy    . Penile prosthesis implant    . Myocardial infarction    . Cardiac catheterization    . Coronary angioplasty    . Coronary artery  bypass graft  2001  . Colonoscopy    . Liver biopsy    . Av fistula placement Left 2015  . Cardiac catheterization N/A 07/30/2014    Procedure: Left Heart Cath and Cors/Grafts Angiography;  Surgeon: Dionisio David, MD;  Location: [facility];  Service: Cardiovascular;  Laterality: N/A;  . Cardiac catheterization N/A 08/07/2014    Procedure: Coronary Stent Intervention;  Surgeon: Lorretta Harp, MD;  Location: [facility];  Service: Cardiovascular;  Laterality: N/A;     Current Outpatient Rx  Name  Route  Sig   Dispense  Refill  . ALPRAZolam (XANAX) 0.5 MG tablet   Oral   Take 0.5 mg by mouth as needed for anxiety.         [name] aspirin EC 81 MG tablet   Oral   Take 81 mg by mouth daily.         . calcitRIOL (ROCALTROL) 0.25 MCG capsule   Oral   Take 0.25 mcg by mouth daily.          . carvedilol (COREG) 6.25 MG tablet   Oral   Take 1.5 tablets (9.375 mg total) by mouth 2 (two) times daily.   270 tablet   3   . ferrous sulfate 325 (65 FE) MG EC tablet   Oral   Take 325 mg by mouth daily with breakfast.          . furosemide (LASIX) 40 MG tablet   Oral   Take 120 mg by mouth 2 (two) times daily.          . hydrALAZINE (APRESOLINE) 50 MG tablet   Oral   Take 1 tablet (50 mg total) by mouth 2 (two) times daily.   60 tablet   5   . insulin glargine (LANTUS) 100 UNIT/ML injection   Subcutaneous   Inject 20 Units into the skin at bedtime.         . isosorbide mononitrate (IMDUR) 60 MG 24 hr tablet   Oral   Take 1.5 tablets (90 mg total) by mouth daily.   135 tablet   3   . nitroGLYCERIN (NITROLINGUAL) 0.4 MG/SPRAY spray   Sublingual   Place 1 spray under the tongue every 5 (five) minutes x 3 doses as needed for chest pain. Do not use together with sublingual nitro   12 g   5   . pregabalin (LYRICA) 75 MG capsule   Oral   Take 75 mg by mouth 2 (two) times daily.         . ranolazine (RANEXA) 1000 MG SR tablet   Oral   Take 1 tablet (1,000 mg total) by mouth 2 (two) times daily.   60 tablet   6   . rosuvastatin (CRESTOR) 20 MG tablet   Oral   Take 20 mg by mouth at bedtime.          . sodium bicarbonate 650 MG tablet   Oral   Take 2 tablets (1,300 mg total) by mouth 2 (two) times daily.   120 tablet   5   . ticagrelor (BRILINTA) 90 MG TABS tablet   Oral   Take 1 tablet (90 mg total) by mouth 2 (two) times daily.   60 tablet   6      Allergies Sulfa antibiotics and Morphine and related   Family History  Problem Relation Age of Onset  .  Acute myelogenous leukemia Brother   . CAD Brother   .  Diabetes Mellitus II Brother   . Heart disease Father 47    CABG    Social History Social History  Substance Use Topics  . Smoking status: Former Smoker    Quit date: 02/07/2008  . Smokeless tobacco: Former Systems developer  . Alcohol Use: No    Review of Systems  Constitutional:   No fever or chills. No weight changes Eyes:   No blurry vision or double vision.  ENT:   No sore throat. Cardiovascular:   No chest pain. Respiratory:   No dyspnea positive productive cough. Gastrointestinal:   Negative for abdominal pain, vomiting and diarrhea.  No BRBPR or melena. Genitourinary:   Negative for dysuria, urinary retention, bloody urine, or difficulty urinating. Musculoskeletal:   Negative for back pain. No joint swelling or pain. Skin:   Negative for rash. Neurological:   Negative for headaches, focal weakness or numbness. Positive dizziness and syncope Psychiatric:  No anxiety or depression.   Endocrine:  No hot/cold intolerance, changes in energy, or sleep difficulty.  10-point ROS otherwise negative.  ____________________________________________   PHYSICAL EXAM:  VITAL SIGNS: ED Triage Vitals  Enc Vitals Group     BP 12/23/14 0951 93/56 mmHg     Pulse Rate 12/23/14 0951 62     Resp 12/23/14 0951 13     Temp 12/23/14 0951 98 F (36.7 C)     Temp Source 12/23/14 0951 Oral     SpO2 12/23/14 0951 95 %     Weight 12/23/14 0951 196 lb (88.905 kg)     Height 12/23/14 0951 5\' 10"  (1.778 m)     Head Cir --      Peak Flow --      Pain Score 12/23/14 0952 0     Pain Loc --      Pain Edu? --      Excl. in [place]? --      Constitutional:   Alert and oriented. Well appearing and in no distress. Eyes:   No scleral icterus. No conjunctival pallor. PERRL. EOMI ENT   Head:   Normocephalic and atraumatic.   Nose:   No congestion/rhinnorhea. No septal hematoma   Mouth/Throat:   MMM, no pharyngeal erythema. No peritonsillar  mass. No uvula shift.   Neck:   No stridor. No SubQ emphysema. No meningismus. Hematological/Lymphatic/Immunilogical:   No cervical lymphadenopathy. Cardiovascular:   Bradycardia heart rate 50s. Normal and symmetric distal pulses are present in all extremities. No murmurs, rubs, or gallops. Respiratory:   Normal respiratory effort without tachypnea nor retractions. Breath sounds are clear and equal bilaterally. No wheezes/rales/rhonchi. Gastrointestinal:   Soft and nontender. No distention. There is no CVA tenderness.  No rebound, rigidity, or guarding. Genitourinary:   deferred Musculoskeletal:   Nontender with normal range of motion in all extremities. No joint effusions.  No lower extremity tenderness.  No edema. Neurologic:   Normal speech and language.  CN 2-10 normal. Motor grossly intact. No pronator drift.  Normal gait. No gross focal neurologic deficits are appreciated.  Skin:    Skin is warm, dry with multiple abrasions on the bilateral upper extremities. No rash noted.  No petechiae, purpura, or bullae. Psychiatric:   Mood and affect are normal. Speech and behavior are normal. Patient exhibits appropriate insight and judgment.  ____________________________________________    LABS (pertinent positives/negatives) (all labs ordered are listed, but only abnormal results are displayed) Labs Reviewed  CBC - Abnormal; Notable for the following:    RBC 2.89 (*)  Hemoglobin 8.1 (*)    HCT 24.4 (*)    RDW 16.4 (*)    Platelets 90 (*)    All other components within normal limits  URINALYSIS COMPLETEWITH MICROSCOPIC ([facility] ONLY) - Abnormal; Notable for the following:    Color, Urine YELLOW (*)    APPearance CLEAR (*)    Protein, ur 100 (*)    All other components within normal limits  BASIC METABOLIC PANEL  TROPONIN I   ____________________________________________   EKG  Interpreted by me Sinus bradycardia rate 55, normal axis intervals. Left ventricular hypertrophy.  There is normal ST segments. There are T-wave inversions in 1 and aVL which are unchanged compared to previous EKG on 11/04/2014  ____________________________________________    RADIOLOGY  Chest x-ray reveals opacity in the right base consistent with pneumonia  ____________________________________________   PROCEDURES CRITICAL CARE Performed by: Joni Fears, Dannetta Lekas   Total critical care time: 35 minutes  Critical care time was exclusive of separately billable procedures and treating other patients.  Critical care was necessary to treat or prevent imminent or life-threatening deterioration.  Critical care was time spent personally by me on the following activities: development of treatment plan with patient and/or surrogate as well as nursing, discussions with consultants, evaluation of patient's response to treatment, examination of patient, obtaining history from patient or surrogate, ordering and performing treatments and interventions, ordering and review of laboratory studies, ordering and review of radiographic studies, pulse oximetry and re-evaluation of patient's condition.   ____________________________________________   INITIAL IMPRESSION / ASSESSMENT AND PLAN / ED COURSE  Pertinent labs & imaging results that were available during my care of the patient were reviewed by me and considered in my medical decision making (see chart for details).  Patient presents with hypotension syncope and very raspy congested sounding cough. High suspicion for pneumonia and sepsis. Hypotension may also be exacerbated by the patient's high-dose beta blocker which is preventing cardiac compensation. We'll start IV fluids while getting a workup.  12:00 PM Blood pressure improved with IV fluids, fluid responsive. We'll continue IV fluids. Oxygen saturation remains adequate. Heart rate or respirations stable. Chest x-ray shows pneumonia. We'll start Levaquin.  1:20 PM Patient remained  stable. With hypotension and syncope in the setting of pneumonia, and severe comorbidities, the patient is not a suitable candidate for outpatient therapy and we'll hospitalize the patient for IV antibiotics and monitoring of his condition to ensure improvement.     ____________________________________________   FINAL CLINICAL IMPRESSION(S) / ED DIAGNOSES  Final diagnoses:  RLL pneumonia  Orthostatic hypotension      Carrie Mew, MD 12/23/14 1326

## 2014-12-23 NOTE — ED Notes (Signed)
Pt reports dizziness with standing while at xray. Pt alert and oriented upon arrival back to room, BP 81/58, NS bolus started.,

## 2014-12-23 NOTE — Progress Notes (Signed)
MEDICATION RELATED CONSULT NOTE - INITIAL   Pharmacy Consult for renal dose adjustment of antibiotics Indication: levofloxacin for PNA  Allergies  Allergen Reactions  . Sulfa Antibiotics Itching    hives  . Morphine And Related     Makes patient feel weird    Patient Measurements: Height: 5\' 10"  (177.8 cm) Weight: 200 lb 0.8 oz (90.742 kg) IBW/kg (Calculated) : 73   Vital Signs: Temp: 98 F (36.7 C) (11/16 0951) Temp Source: Oral (11/16 0951) BP: 132/52 mmHg (11/16 1529) Pulse Rate: 55 (11/16 1529) Intake/Output from previous day:   Intake/Output from this shift:    Labs:  Recent Labs  12/23/14 0955  WBC 6.0  HGB 8.1*  HCT 24.4*  PLT 90*  CREATININE 8.66*   Estimated Creatinine Clearance: 9.5 mL/min (by C-G formula based on Cr of 8.66).   Microbiology: No results found for this or any previous visit (from the past 720 hour(s)).  Medical History: Past Medical History  Diagnosis Date  . Hypertension   . Hypercholesterolemia   . Prostate cancer ([place])   . Hepatosplenomegaly 2015    fibrosis, no cirrhosis on 06/2013 liver biopsy  . Thrombocytopenia ([place]) 2015  . Myocardial infarction ([place]) 2001  . Depression with anxiety   . IDDM (insulin dependent diabetes mellitus) ([place])     INSULIN DEPENDENT  . GERD (gastroesophageal reflux disease)   . History of shingles   . Ischemic cardiomyopathy     a. echo 08/06/2014: EF 25-30%, multiple WMA, mod DD, PASP 49 mm Hg   . CKD (chronic kidney disease), stage IV (HCC)     a. previously on dialysis; b. followed by Dr. Abigail Butts   . Coronary artery disease, occlusive     a. s/p 4v CABG 2001(LIMA-LAD, SVG-D2, SVG-OM1, SVG-right PDA, SVG-right PL1); b. cath 07/30/2014 vein grafts all down, patent LIMA to LAD, significant LM and ost LCx dx; c. cath 08/07/2014 Synergy DES 2.5 mm x 18 mm to dist LM and ost LCx, rec lifelong DAPT  . Coronary artery disease involving coronary bypass graft 08/2014    all vein grafts occluded. Patent  LIMA-LAD -- s/p PCI ot LM-Cx;   [name] Chronic combined systolic and diastolic CHF, NYHA class 2 ([place])     a. echo 08/2013: EF 40-45%, impaired relaxation, mild MR, LA mildly dilated,    . Gallstone pancreatitis 2015    Assessment: Pharmacy consulted to adjust doses of antibiotics per renal function. Patient received levofloxacin 750 mg IV today in ED and was prescribed levofloxacin 750 mg PO daily for PNA.  Patient has a SCr of 8.66. Per MD note, patient received dialysis for a period of approximately 4 months last year but reports not being on dialysis for the past year. Nephrology consulted but has not seen the patient yet. Estimated creatinine clearance today is ~9.5 mL/min.  Plan:  Patient received levofloxacin 750 mg IV x1 dose in ED Changed order to levofloxacin 500 mg PO q48 hours to start on 11/18  Will follow renal function and adjust dose if needed per protocol. Thank you for the consult.  Darylene Price [place] Craigo 12/23/2014,4:15 PM

## 2014-12-23 NOTE — ED Notes (Signed)
Pt back from x-ray.

## 2014-12-23 NOTE — H&P (Signed)
Joseph [name] at [place] NAME: Joseph [name]    MR#:  [id number]  DATE OF BIRTH:  1945/04/21  DATE OF ADMISSION:  12/23/2014  PRIMARY CARE PHYSICIAN: Joseph Noble, MD   REQUESTING/REFERRING PHYSICIAN: Dr. Joni [name]  CHIEF COMPLAINT:  Cough and shortness of breath; passed out  HISTORY OF PRESENT ILLNESS:  Joseph [name]  is a 69 y.o. male with a known history of chronic systolic congestive heart failure, chronic kidney disease, insulin requiring diabetes medicines, coronary artery disease and myocardial infarction and chronic thrombocytopenia with baseline platelet count at around 60,000 is presenting to the ED with a chief complaint of shortness of breath and productive cough for approximately 1 week. Patient actually was hypotensive and passed out and brought into the ED by EMS. This morning as he was leaving the house to go to his doctor's office, he had a syncopal event in the parking lot. He then got to his doctor's office where he had 2 more syncopal episodes. Blood pressure on arrival by EMS was 80/60. This morning as he was leaving the house to go to his doctor's office, he had a syncopal event in the parking lot. He then got to his doctor's office where he had 2 more syncopal episodes. Blood pressure on arrival by EMS was 80/60. Patient was given fluid bolus in the ED. Chest x-ray revealed pneumonia regarding which he was started on IV levofloxacin. Denies any chest pain during my examination  PAST MEDICAL HISTORY:   Past Medical History  Diagnosis Date  . Hypertension   . Hypercholesterolemia   . Prostate cancer ([place])   . Hepatosplenomegaly 2015    fibrosis, no cirrhosis on 06/2013 liver biopsy  . Thrombocytopenia ([place]) 2015  . Myocardial infarction ([place]) 2001  . Depression with anxiety   . IDDM (insulin dependent diabetes mellitus) ([place])     INSULIN DEPENDENT  . GERD (gastroesophageal reflux disease)   . History of shingles    . Ischemic cardiomyopathy     a. echo 08/06/2014: EF 25-30%, multiple WMA, mod DD, PASP 49 mm Hg   . CKD (chronic kidney disease), stage IV ([facility])     a. previously on dialysis; b. followed by Dr. Abigail [name]   . Coronary artery disease, occlusive     a. s/p 4v CABG 2001(LIMA-LAD, SVG-D2, SVG-OM1, SVG-right PDA, SVG-right PL1); b. cath 07/30/2014 vein grafts all down, patent LIMA to LAD, significant LM and ost LCx dx; c. cath 08/07/2014 Synergy DES 2.5 mm x 18 mm to dist LM and ost LCx, rec lifelong DAPT  . Coronary artery disease involving coronary bypass graft 08/2014    all vein grafts occluded. Patent LIMA-LAD -- s/p PCI ot LM-Cx;   [name] Chronic combined systolic and diastolic CHF, NYHA class 2 ([place])     a. echo 08/2013: EF 40-45%, impaired relaxation, mild MR, LA mildly dilated,    . Gallstone pancreatitis 2015    PAST SURGICAL HISTOIRY:   Past Surgical History  Procedure Laterality Date  . Cardiac surgery    . Shoulder arthroscopy    . Penile prosthesis implant    . Myocardial infarction    . Cardiac catheterization    . Coronary angioplasty    . Coronary artery bypass graft  2001  . Colonoscopy    . Liver biopsy    . Av fistula placement Left 2015  . Cardiac catheterization N/A 07/30/2014    Procedure: Left Heart Cath and Cors/Grafts Angiography;  Surgeon:  Joseph David, MD;  Location: [facility];  Service: Cardiovascular;  Laterality: N/A;  . Cardiac catheterization N/A 08/07/2014    Procedure: Coronary Stent Intervention;  Surgeon: Joseph Harp, MD;  Location: [facility];  Service: Cardiovascular;  Laterality: N/A;    SOCIAL HISTORY:   Social History  Substance Use Topics  . Smoking status: Former Smoker    Quit date: 02/07/2008  . Smokeless tobacco: Former Systems developer  . Alcohol Use: No   patient lives alone  FAMILY HISTORY:   Family History  Problem Relation Age of Onset  . Acute myelogenous leukemia Brother   . CAD Brother   . Diabetes Mellitus II Brother    . Heart disease Father 56    CABG    DRUG ALLERGIES:   Allergies  Allergen Reactions  . Sulfa Antibiotics Itching    hives  . Morphine And Related     Makes patient feel weird    REVIEW OF SYSTEMS:  CONSTITUTIONAL: No fever, reporting fatigue or weakness.  EYES: No blurred or double vision.  EARS, NOSE, AND THROAT: No tinnitus or ear pain.  RESPIRATORY: Reporting productive cough, shortness of breath, denies wheezing or hemoptysis.  CARDIOVASCULAR: No chest pain, orthopnea, edema. Reporting lightheadedness when he stands up GASTROINTESTINAL: No nausea, vomiting, diarrhea or abdominal pain.  GENITOURINARY: No dysuria, hematuria.  ENDOCRINE: No polyuria, nocturia,  HEMATOLOGY: No anemia, easy bruising or bleeding SKIN: No lacerations from fall on his extremities MUSCULOSKELETAL: No joint pain or arthritis.   NEUROLOGIC: No tingling, numbness, weakness.  PSYCHIATRY: No anxiety or depression.   MEDICATIONS AT HOME:   Prior to Admission medications   Medication Sig Start Date End Date Taking? Authorizing Provider  ALPRAZolam Duanne Moron) 0.5 MG tablet Take 0.5 mg by mouth as needed for anxiety.   Yes Historical Provider, MD  aspirin EC 81 MG tablet Take 81 mg by mouth daily.   Yes Historical Provider, MD  calcitRIOL (ROCALTROL) 0.25 MCG capsule Take 0.25 mcg by mouth daily.    Yes Historical Provider, MD  carvedilol (COREG) 6.25 MG tablet Take 1.5 tablets (9.375 mg total) by mouth 2 (two) times daily. 12/09/14  Yes Joseph Canales, PA-C  ferrous sulfate 325 (65 FE) MG EC tablet Take 325 mg by mouth daily with breakfast.    Yes Historical Provider, MD  furosemide (LASIX) 40 MG tablet Take 120 mg by mouth 2 (two) times daily.    Yes Historical Provider, MD  hydrALAZINE (APRESOLINE) 50 MG tablet Take 1 tablet (50 mg total) by mouth 2 (two) times daily. 11/08/14  Yes Joseph Canales, PA-C  insulin glargine (LANTUS) 100 UNIT/ML injection Inject 20 Units into the skin at bedtime.   Yes  Historical Provider, MD  isosorbide mononitrate (IMDUR) 60 MG 24 hr tablet Take 1.5 tablets (90 mg total) by mouth daily. 12/09/14  Yes Joseph Canales, PA-C  nitroGLYCERIN (NITROLINGUAL) 0.4 MG/SPRAY spray Place 1 spray under the tongue every 5 (five) minutes x 3 doses as needed for chest pain. Do not use together with sublingual nitro 08/08/14  Yes Almyra Deforest, PA  pregabalin (LYRICA) 75 MG capsule Take 75 mg by mouth 2 (two) times daily.   Yes Historical Provider, MD  ranolazine (RANEXA) 1000 MG SR tablet Take 1 tablet (1,000 mg total) by mouth 2 (two) times daily. 09/11/14  Yes Ryan M Dunn, PA-C  rosuvastatin (CRESTOR) 20 MG tablet Take 20 mg by mouth at bedtime.    Yes Historical Provider, MD  sodium bicarbonate 650 MG tablet Take 2 tablets (1,300 mg total) by mouth 2 (two) times daily. 11/08/14  Yes Joseph Canales, PA-C  ticagrelor (BRILINTA) 90 MG TABS tablet Take 1 tablet (90 mg total) by mouth 2 (two) times daily. 12/11/14  Yes Joseph Canales, PA-C      VITAL SIGNS:  Blood pressure 106/50, pulse 50, temperature 98 F (36.7 C), temperature source Oral, resp. rate 18, height 5\' 10"  (1.778 m), weight 88.905 kg (196 lb), SpO2 95 %.  PHYSICAL EXAMINATION:  GENERAL:  69 y.o.-year-old patient lying in the bed with no acute distress.  EYES: Pupils equal, round, reactive to light and accommodation. No scleral icterus. Extraocular muscles intact.  HEENT: Head atraumatic, normocephalic. Oropharynx and nasopharynx clear.  NECK:  Supple, no jugular venous distention. No thyroid enlargement, no tenderness.  LUNGS: Moderate air entry with decreased breath sounds in the right lung base , positive crackles on the right side, no wheezing. No use of accessory muscles of respiration.  CARDIOVASCULAR: S1, S2 normal. No murmurs, rubs, or gallops.  ABDOMEN: Soft, nontender, nondistended. Bowel sounds present. No organomegaly or mass.  EXTREMITIES: Upper extremities, hands with lacerations from fall in the morning. No  pedal edema, cyanosis, or clubbing.  NEUROLOGIC: Cranial nerves II through XII are intact. Muscle strength 5/5 in all extremities. Sensation intact. Gait not checked.  PSYCHIATRIC: The patient is alert and oriented x 3.  SKIN: No obvious rash, lesion, or ulcer.   LABORATORY PANEL:   CBC  Recent Labs Lab 12/23/14 0955  WBC 6.0  HGB 8.1*  HCT 24.4*  PLT 90*   ------------------------------------------------------------------------------------------------------------------  Chemistries  No results for input(s): NA, K, CL, CO2, GLUCOSE, BUN, CREATININE, CALCIUM, MG, AST, ALT, ALKPHOS, BILITOT in the last 168 hours.  Invalid input(s): GFRCGP ------------------------------------------------------------------------------------------------------------------  Cardiac Enzymes  Recent Labs Lab 12/23/14 0955  TROPONINI 0.11*   ------------------------------------------------------------------------------------------------------------------  RADIOLOGY:  Dg Chest 2 View  12/23/2014  CLINICAL DATA:  Syncopal episode this morning, fell several times, cough, CHF, hypertension, diabetes mellitus, former smoker EXAM: CHEST  2 VIEW COMPARISON:  11/03/2014 FINDINGS: Enlargement of cardiac silhouette post CABG. Mediastinal contours and pulmonary vascularity normal. RIGHT basilar infiltrate consistent with pneumonia. Remaining lungs clear. No pleural effusion or pneumothorax. IMPRESSION: Enlargement of cardiac silhouette post CABG. RIGHT basilar infiltrate consistent with pneumonia. Electronically Signed   By: Lavonia Dana M.D.   On: 12/23/2014 10:49    EKG:   Orders placed or performed during the hospital encounter of 12/23/14  . ED EKG  . ED EKG  . EKG 12-Lead  . EKG 12-Lead  . EKG 12-Lead  . EKG 12-Lead  . EKG 12-Lead  . EKG 12-Lead  . EKG 12-Lead  . EKG 12-Lead    IMPRESSION AND PLAN:   Assessment and plan  1. Right lower lobe pneumonia probably community-acquired Patient is  hypotensive. Lactic acid is pending. No leukocytosis We will treat him with IV levofloxacin Blood cultures, sputum culture and sensitivities ordered. Urine for Legionella antigen and streptococcal antigen was ordered. Will provide DuoNeb nebulizer treatments  2. Syncope probably secondary to orthostatic hypotension other differentials can be cardiogenic/arrythremia's Check orthostatic blood pressure Will provide IV fluids with close monitoring for symptoms and signs of fluid overload as the patient has systolic congestive heart failure Cycle cardiac biomarkers, initial troponin is elevated at 0.11 but patient has chronic kidney disease, previous creatinine was at 6.69, today's BMP was ordered at 9:55 AM by the ED physician which is still pending  Patient will be monitored in telemetry Hold antihypertensive including Coreg, Imdur, Lasix and other antihypertensives  3. Chronic systolic congestive heart failure, currently not fluid overloaded Will hold his Lasix as patient is currently hypotensive We will monitor daily intake and output Check daily weights  4. Chronic kidney disease-4 Today's BMP is still pending Patient was on hemodialysis for 4 months last year and has been not getting dialysis for the past one year and sees Dr. Juleen China, will consult nephrology  5. Elevated troponin with history of coronary artery disease Will monitor patient on telemetry  and cycle cardiac biomarkers Patient is currently symptomatic Continue aspirin. Hold antihypertensives including Coreg and Imdur in view of hypertension   6. Diabetic mellitus Provide sliding scale insulin and diabetic diet We'll check hemoglobin A1c in a.m. Will reduce his home dose Lantus insulin from 2O to 10 units as patient is currently sick  7. Chronic thrombocytopenia, patient's baseline platelet count is 60 as reported by the patient Today's platelet count is at 90. No bleeding or bruising noticed Will continue close  monitoring  DVT prophylaxis with Lovenox subcutaneous     All the records are reviewed and case discussed with ED provider. Management plans discussed with the patient, family and they are in agreement.  CODE STATUS: Full code, daughter.   TOTAL TIME TAKING CARE OF THIS PATIENT:  45 minutes.    Nicholes Mango M.D on 12/23/2014 at 2:17 PM  Between 7am to 6pm - Pager - [phone]  After 6pm go to [email or web address] - password [email or web address] [facility]  Office  [phone]  CC: Primary care physician; Joseph Noble, MD

## 2014-12-24 ENCOUNTER — Inpatient Hospital Stay
Admit: 2014-12-24 | Discharge: 2014-12-24 | Disposition: A | Discharge status: Home / Self Care | Payer: Medicare Other | Attending: Internal Medicine | Admitting: Internal Medicine

## 2014-12-24 LAB — STREP PNEUMONIAE URINARY ANTIGEN: STREP PNEUMO URINARY ANTIGEN: NEGATIVE

## 2014-12-24 LAB — EXPECTORATED SPUTUM ASSESSMENT W GRAM STAIN, RFLX TO RESP C

## 2014-12-24 LAB — GLUCOSE, CAPILLARY
GLUCOSE-CAPILLARY: 123 mg/dL — AB (ref 65–99)
GLUCOSE-CAPILLARY: 169 mg/dL — AB (ref 65–99)
Glucose-Capillary: 121 mg/dL — ABNORMAL HIGH (ref 65–99)
Glucose-Capillary: 132 mg/dL — ABNORMAL HIGH (ref 65–99)

## 2014-12-24 LAB — CK: CK TOTAL: 211 U/L (ref 49–397)

## 2014-12-24 LAB — EXPECTORATED SPUTUM ASSESSMENT W REFEX TO RESP CULTURE

## 2014-12-24 LAB — TROPONIN I: Troponin I: 0.08 ng/mL — ABNORMAL HIGH (ref <0.031)

## 2014-12-24 MED ORDER — IPRATROPIUM-ALBUTEROL 0.5-2.5 (3) MG/3ML IN SOLN: 3.0000 mL | Freq: Four times a day (QID) | RESPIRATORY_TRACT | Status: DC | PRN

## 2014-12-24 MED ORDER — PREGABALIN 75 MG PO CAPS: 75.0000 mg | ORAL_CAPSULE | Freq: Every day | ORAL | Status: DC

## 2014-12-24 MED ORDER — GUAIFENESIN ER 600 MG PO TB12
600.0000 mg | ORAL_TABLET | Freq: Two times a day (BID) | ORAL | Status: DC
  Administered 2014-12-24 – 2014-12-28 (×8): 600 mg via ORAL
  Filled 2014-12-24 (×8): qty 1

## 2014-12-24 MED ORDER — PREGABALIN 75 MG PO CAPS: 75.0000 mg | ORAL_CAPSULE | Freq: Once | ORAL | Status: DC

## 2014-12-24 MED ORDER — PREGABALIN 75 MG PO CAPS
75.0000 mg | ORAL_CAPSULE | Freq: Two times a day (BID) | ORAL | Status: DC
  Administered 2014-12-24 – 2014-12-28 (×8): 75 mg via ORAL
  Filled 2014-12-24 (×8): qty 1

## 2014-12-24 MED ORDER — HYDRALAZINE HCL 20 MG/ML IJ SOLN
10.0000 mg | INTRAMUSCULAR | Status: DC | PRN
  Administered 2014-12-24: 10 mg via INTRAVENOUS
  Filled 2014-12-24: qty 1

## 2014-12-24 NOTE — Progress Notes (Signed)
*  PRELIMINARY RESULTS* Echocardiogram 2D Echocardiogram has been performed.  Joseph [name] 12/24/2014, 6:39 PM

## 2014-12-24 NOTE — Progress Notes (Signed)
RT gave 1400 neb, patient requests nebs be made PRN, says he doesn't feel they help him at all.  Severity score on RT protocol assessment is 4, making PRN appropriate for patient

## 2014-12-24 NOTE — Progress Notes (Signed)
[facility]  ROUNDING NOTE   Subjective:  Pt seen at bedside.  We follow him closely as outpt for advanced CKD.   Most recent egfr was found to be 10.   Had recent fall, sustained lacerations to his hands.   Objective:  Vital signs in last 24 hours:  Temp:  [97.4 F (36.3 C)-97.8 F (36.6 C)] 97.4 F (36.3 C) (11/17 0422) Pulse Rate:  [58-66] 66 (11/17 1140) Resp:  [18] 18 (11/17 1137) BP: (138-162)/(56-73) 149/59 mmHg (11/17 1140) SpO2:  [97 %-100 %] 97 % (11/17 1445)  Weight change:  Filed Weights   12/23/14 0951 12/23/14 1529  Weight: 88.905 kg (196 lb) 90.742 kg (200 lb 0.8 oz)    Intake/Output: I/O last 3 completed shifts: In: 238.3 [I.V.:238.3] Out: 300 [Urine:300]   Intake/Output this shift:  Total I/O In: 240 [P.O.:240] Out: 1150 [Urine:1150]  Physical Exam: General: NAD  Head: Normocephalic, atraumatic. Moist oral mucosal membranes  Eyes: Anicteric  Neck: Supple, trachea midline  Lungs:  Clear to auscultation normal effort  Heart: S1S2 no rubs  Abdomen:  Soft, nontender, BS present.   Extremities:  trace peripheral edema.  Neurologic: Nonfocal, moving all four extremities  Skin: No lesions  Access: LUE AVF with good bruit and thrill    Basic Metabolic Panel:  Recent Labs Lab 12/23/14 0955 12/23/14 1554  NA 136  --   K 4.9  --   CL 98*  --   CO2 23  --   GLUCOSE 170*  --   BUN 131*  --   CREATININE 8.66* 8.61*  CALCIUM 8.1*  --     Liver Function Tests: No results for input(s): AST, ALT, ALKPHOS, BILITOT, PROT, ALBUMIN in the last 168 hours. No results for input(s): LIPASE, AMYLASE in the last 168 hours. No results for input(s): AMMONIA in the last 168 hours.  CBC:  Recent Labs Lab 12/23/14 0955 12/23/14 1554  WBC 6.0 4.5  HGB 8.1* 7.5*  HCT 24.4* 22.5*  MCV 84.4 85.0  PLT 90* 77*    Cardiac Enzymes:  Recent Labs Lab 12/23/14 0955 12/23/14 1554 12/23/14 2206 12/24/14 0340  TROPONINI 0.11* 0.09* 0.08*  0.08*    BNP: Invalid input(s): POCBNP  CBG:  Recent Labs Lab 12/23/14 1551 12/24/14 0757 12/24/14 1142  GLUCAP 246* 123* 169*    Microbiology: Results for orders placed or performed during the hospital encounter of 12/23/14  Culture, blood (routine x 2) Call MD if unable to obtain prior to antibiotics being given     Status: None (Preliminary result)   Collection Time: 12/23/14  3:54 PM  Result Value Ref Range Status   Specimen Description BLOOD RIGHT ASSIST CONTROL  Final   Special Requests   Final    BOTTLES DRAWN AEROBIC AND ANAEROBIC 8CC AERO 5CC ANA   Culture NO GROWTH < 24 HOURS  Final   Report Status PENDING  Incomplete  Culture, blood (routine x 2) Call MD if unable to obtain prior to antibiotics being given     Status: None (Preliminary result)   Collection Time: 12/23/14  5:24 PM  Result Value Ref Range Status   Specimen Description BLOOD RIGHT HAND  Final   Special Requests BOTTLES DRAWN AEROBIC AND ANAEROBIC 4CC  Final   Culture NO GROWTH < 24 HOURS  Final   Report Status PENDING  Incomplete  Culture, sputum-assessment     Status: None   Collection Time: 12/24/14 12:38 PM  Result Value Ref Range Status  Specimen Description EXPECTORATED SPUTUM  Final   Special Requests NONE  Final   Sputum evaluation THIS SPECIMEN IS ACCEPTABLE FOR SPUTUM CULTURE  Final   Report Status 12/24/2014 FINAL  Final    Coagulation Studies: No results for input(s): LABPROT, INR in the last 72 hours.  Urinalysis:  Recent Labs  12/23/14 1206  COLORURINE YELLOW*  LABSPEC 1.011  PHURINE 5.0  GLUCOSEU NEGATIVE  HGBUR NEGATIVE  BILIRUBINUR NEGATIVE  KETONESUR NEGATIVE  PROTEINUR 100*  NITRITE NEGATIVE  LEUKOCYTESUR NEGATIVE      Imaging: Dg Chest 2 View  12/23/2014  CLINICAL DATA:  Syncopal episode this morning, fell several times, cough, CHF, hypertension, diabetes mellitus, former smoker EXAM: CHEST  2 VIEW COMPARISON:  11/03/2014 FINDINGS: Enlargement of cardiac  silhouette post CABG. Mediastinal contours and pulmonary vascularity normal. RIGHT basilar infiltrate consistent with pneumonia. Remaining lungs clear. No pleural effusion or pneumothorax. IMPRESSION: Enlargement of cardiac silhouette post CABG. RIGHT basilar infiltrate consistent with pneumonia. Electronically Signed   By: Lavonia Dana M.D.   On: 12/23/2014 10:49     Medications:   . sodium chloride 100 mL/hr at 12/24/14 0828   . aspirin EC  81 mg Oral Daily  . bacitracin   Topical BID  . calcitRIOL  0.25 mcg Oral Daily  . enoxaparin (LOVENOX) injection  30 mg Subcutaneous Q24H  . ferrous sulfate  325 mg Oral Q breakfast  . insulin aspart  0-5 Units Subcutaneous QHS  . insulin aspart  0-9 Units Subcutaneous TID WC  . insulin glargine  20 Units Subcutaneous QHS  . [START ON 12/25/2014] levofloxacin  500 mg Oral Q48H  . pregabalin  75 mg Oral BID  . ranolazine  1,000 mg Oral BID  . rosuvastatin  20 mg Oral QHS  . sodium bicarbonate  1,300 mg Oral BID  . ticagrelor  90 mg Oral BID   acetaminophen, ALPRAZolam, ipratropium-albuterol, nitroGLYCERIN, ondansetron (ZOFRAN) IV, zolpidem  Assessment/ Plan:  69 y.o. male  with coronary artery disease status post CABG, diabetes mellitus type 2, hypertension, prostate cancer, hyperlipidemia presents as a follow up patient for chronic kidney disease stage V/ESRD, anemia of CKD, SHPTH  1.  End-stage renal disease.  The patient has been off of dialysisfor significant period of time. We have been following him as an outpatient closely. It appears that he has reapproach end-stage renal disease.  He does appear to be agreeable to restarting dialysis. No urgent indication for dialysis stable we will consider this tomorrow.  To.  Anemia chronic kidney disease per her hemoglobin low at 7.5.  We will start the patient on Epogen 10,000 units IV with dialysis.  3.  Secondary hyperparathyroidism.  We will check PTH and phosphorus with next dialysis  treatment.  4. Metabolic acidosis:  Will likely be able to stop sodium bicarb once pt starts dialysis.    LOS: 1 Rune Mendez 11/17/20163:40 PM

## 2014-12-24 NOTE — Progress Notes (Signed)
[facility] NAME: Joseph [name]    MR#:  [id number]  DATE OF BIRTH:  07/10/1945  SUBJECTIVE:  CHIEF COMPLAINT:   Chief Complaint  Patient presents with  . Loss of Consciousness  . Hypotension   patient is 69 year old Caucasian male with past medical history significant for history of renal disease, left upper extremity AV fistula , chronic systolic congestive heart failure, previous mellitus, coronary artery disease who presents to the hospital with complaints of cough, shortness of breath and passing out. Patient has been having cough approximately 1 week now. He was noted to be hypotensive and passed out. His blood pressure was 80/60. In emergency room, he was given IV fluids and improved clinically. He was noted to have pneumonia on chest x-ray and was admitted to the hospital. He feels a little bit better today. His labs revealed worsening renal failure. Nephrologist seems to think that patient should be initiated on hemodialysis.   Review of Systems  Constitutional: Negative for fever, chills and weight loss.  HENT: Negative for congestion.   Eyes: Negative for blurred vision and double vision.  Respiratory: Positive for cough and shortness of breath. Negative for sputum production and wheezing.   Cardiovascular: Negative for chest pain, palpitations, orthopnea, leg swelling and PND.  Gastrointestinal: Negative for nausea, vomiting, abdominal pain, diarrhea, constipation and blood in stool.  Genitourinary: Negative for dysuria, urgency, frequency and hematuria.  Musculoskeletal: Negative for falls.  Neurological: Negative for dizziness, tremors, focal weakness and headaches.  Endo/Heme/Allergies: Does not bruise/bleed easily.  Psychiatric/Behavioral: Negative for depression. The patient does not have insomnia.     VITAL SIGNS: Blood pressure 149/59, pulse 66, temperature 97.4 F (36.3 C), temperature source Oral, resp.  rate 18, height 5\' 10"  (1.778 m), weight 90.742 kg (200 lb 0.8 oz), SpO2 97 %.  PHYSICAL EXAMINATION:   GENERAL:  69 y.o.-year-old patient lying in the bed with no acute distress.  EYES: Pupils equal, round, reactive to light and accommodation. No scleral icterus. Extraocular muscles intact.  HEENT: Head atraumatic, normocephalic. Oropharynx and nasopharynx clear.  NECK:  Supple, no jugular venous distention. No thyroid enlargement, no tenderness.  LUNGS: Normal breath sounds bilaterally, no wheezing, rales,rhonchi , few right-sided crepitations posteriorly. No use of accessory muscles of respiration.  CARDIOVASCULAR: S1, S2 normal. No murmurs, rubs, or gallops.  ABDOMEN: Soft, nontender, nondistended. Bowel sounds present. No organomegaly or mass.  EXTREMITIES: No pedal edema, cyanosis, or clubbing. Left upper extremity AV fistula with good bruit and thrill NEUROLOGIC: Cranial nerves II through XII are intact. Muscle strength 5/5 in all extremities. Sensation intact. Gait not checked.  PSYCHIATRIC: The patient is alert and oriented x 3.  SKIN: No obvious rash, lesion, or ulcer.   ORDERS/RESULTS REVIEWED:   CBC  Recent Labs Lab 12/23/14 0955 12/23/14 1554  WBC 6.0 4.5  HGB 8.1* 7.5*  HCT 24.4* 22.5*  PLT 90* 77*  MCV 84.4 85.0  MCH 28.1 28.2  MCHC 33.3 33.2  RDW 16.4* 16.1*   ------------------------------------------------------------------------------------------------------------------  Chemistries   Recent Labs Lab 12/23/14 0955 12/23/14 1554  NA 136  --   K 4.9  --   CL 98*  --   CO2 23  --   GLUCOSE 170*  --   BUN 131*  --   CREATININE 8.66* 8.61*  CALCIUM 8.1*  --    ------------------------------------------------------------------------------------------------------------------ estimated creatinine clearance is 9.6 mL/min (by C-G formula based on Cr of  8.61). ------------------------------------------------------------------------------------------------------------------ No  results for input(s): TSH, T4TOTAL, T3FREE, THYROIDAB in the last 72 hours.  Invalid input(s): FREET3  Cardiac Enzymes  Recent Labs Lab 12/23/14 1554 12/23/14 2206 12/24/14 0340  TROPONINI 0.09* 0.08* 0.08*   ------------------------------------------------------------------------------------------------------------------ Invalid input(s): POCBNP ---------------------------------------------------------------------------------------------------------------  RADIOLOGY: Dg Chest 2 View  12/23/2014  CLINICAL DATA:  Syncopal episode this morning, fell several times, cough, CHF, hypertension, diabetes mellitus, former smoker EXAM: CHEST  2 VIEW COMPARISON:  11/03/2014 FINDINGS: Enlargement of cardiac silhouette post CABG. Mediastinal contours and pulmonary vascularity normal. RIGHT basilar infiltrate consistent with pneumonia. Remaining lungs clear. No pleural effusion or pneumothorax. IMPRESSION: Enlargement of cardiac silhouette post CABG. RIGHT basilar infiltrate consistent with pneumonia. Electronically Signed   By: Lavonia Dana M.D.   On: 12/23/2014 10:49    EKG:  Orders placed or performed during the hospital encounter of 12/23/14  . ED EKG  . ED EKG  . EKG 12-Lead  . EKG 12-Lead  . EKG 12-Lead  . EKG 12-Lead  . EKG 12-Lead  . EKG 12-Lead  . EKG 12-Lead  . EKG 12-Lead    ASSESSMENT AND PLAN:  Active Problems:   PNA (pneumonia) 1. Syncope, likely due to hypotension, the pressure has improved. Get orthostatic vital signs 2. Hypotension, resolved with IV fluid administration, likely sepsis related, following, holding Coreg, hydralazine, isosorbide, mononitrate 3. Right lower lobe bacterial pneumonia due to unknown infectious agent, sputum cultures. Continue patient on levofloxacin. Blood cultures are negative so far 4. End-stage renal disease,  nephrologist is to initiate hemodialysis via left upper extremity AV fistula 5. Anemia of chronic disease with hemoglobin level of 7.5, patient will be receiving epogen with hemodialysis 6. Chronic thrombocytopenia, stable 7. Elevated troponin, likely demand ischemia due to hypotension, get cardiology evaluation. Get echocardiogram,     Management plans discussed with the patient, family and they are in agreement.   DRUG ALLERGIES:  Allergies  Allergen Reactions  . Sulfa Antibiotics Itching    hives  . Morphine And Related     Makes patient feel weird    CODE STATUS:     Code Status Orders        Start     Ordered   12/23/14 1535  Full code   Continuous     12/23/14 1534    Advance Directive Documentation        Most Recent Value   Type of Advance Directive  Healthcare Power of Attorney   Pre-existing out of facility DNR order (yellow form or pink MOST form)     "MOST" Form in Place?        TOTAL TIME TAKING CARE OF THIS PATIENT: 40es.    Theodoro Grist M.D on 12/24/2014 at 4:34 PM  Between 7am to 6pm - Pager - [phone]  After 6pm go to [email or web address] - password [email or web address] [facility]  Office  [phone]  CC: Primary care physician; Marden Noble, MD

## 2014-12-25 DIAGNOSIS — I5022 Chronic systolic (congestive) heart failure: Secondary | ICD-10-CM

## 2014-12-25 DIAGNOSIS — R7989 Other specified abnormal findings of blood chemistry: Secondary | ICD-10-CM

## 2014-12-25 LAB — BASIC METABOLIC PANEL
Anion gap: 13 (ref 5–15)
BUN: 115 mg/dL — ABNORMAL HIGH (ref 6–20)
CO2: 20 mmol/L — ABNORMAL LOW (ref 22–32)
Calcium: 7.5 mg/dL — ABNORMAL LOW (ref 8.9–10.3)
Chloride: 104 mmol/L (ref 101–111)
Creatinine, Ser: 7.51 mg/dL — ABNORMAL HIGH (ref 0.61–1.24)
GFR calc Af Amer: 8 mL/min — ABNORMAL LOW (ref >60)
GFR calc non Af Amer: 7 mL/min — ABNORMAL LOW (ref >60)
Glucose, Bld: 221 mg/dL — ABNORMAL HIGH (ref 65–99)
Potassium: 4.5 mmol/L (ref 3.5–5.1)
Sodium: 137 mmol/L (ref 135–145)

## 2014-12-25 LAB — GLUCOSE, CAPILLARY
Glucose-Capillary: 229 mg/dL — ABNORMAL HIGH (ref 65–99)
Glucose-Capillary: 195 mg/dL — ABNORMAL HIGH (ref 65–99)

## 2014-12-25 LAB — TROPONIN I: Troponin I: 0.06 ng/mL — ABNORMAL HIGH (ref <0.031)

## 2014-12-25 LAB — LEGIONELLA PNEUMOPHILA SEROGP 1 UR AG: L. pneumophila Serogp 1 Ur Ag: NEGATIVE

## 2014-12-25 LAB — CBC
HEMATOCRIT: 23 % — AB (ref 40.0–52.0)
HEMOGLOBIN: 7.5 g/dL — AB (ref 13.0–18.0)
MCH: 27.9 pg (ref 26.0–34.0)
MCHC: 32.9 g/dL (ref 32.0–36.0)
MCV: 84.9 fL (ref 80.0–100.0)
Platelets: 70 10*3/uL — ABNORMAL LOW (ref 150–440)
RBC: 2.71 MIL/uL — ABNORMAL LOW (ref 4.40–5.90)
RDW: 17 % — AB (ref 11.5–14.5)
WBC: 4.6 10*3/uL (ref 3.8–10.6)

## 2014-12-25 MED ORDER — CARVEDILOL 3.125 MG PO TABS
3.1250 mg | ORAL_TABLET | Freq: Two times a day (BID) | ORAL | Status: DC
  Administered 2014-12-25 – 2014-12-27 (×4): 3.125 mg via ORAL
  Filled 2014-12-25 (×4): qty 1

## 2014-12-25 MED ORDER — ISOSORBIDE MONONITRATE ER 30 MG PO TB24
30.0000 mg | ORAL_TABLET | Freq: Every day | ORAL | Status: DC
  Administered 2014-12-25 – 2014-12-27 (×3): 30 mg via ORAL
  Filled 2014-12-25 (×3): qty 1

## 2014-12-25 MED ORDER — TUBERCULIN PPD 5 UNIT/0.1ML ID SOLN
5.0000 [IU] | Freq: Once | INTRADERMAL | Status: AC
  Administered 2014-12-25: 5 [IU] via INTRADERMAL
  Filled 2014-12-25: qty 0.1

## 2014-12-25 NOTE — Progress Notes (Signed)
Pt returned from dialysis; small hematoma to fistula L arm, as reported by dialysis RN. Cold compress applied per pt request, otherwise no complaints of pain at this site. Will continue to monitor.

## 2014-12-25 NOTE — Progress Notes (Signed)
Pt with complaints of chest pain initially attributed to excessive coughing causing muscular pain. Pain continue to worse and radiate to left arm, pt stated the pain he was experiencing was similar to previously when he had an MI. EKG per tele monitor was unchanged. Dr Marcille Blanco notified, stat troponins ordered. SubL Nitro administered, BP cycling Q25min. Pt received 1 nitro and stated the pain was " much better". Will Continue to monitor

## 2014-12-25 NOTE — Progress Notes (Signed)
Intradermal PPD administered R anterior forearm & circled. To be read 48hr from now.

## 2014-12-25 NOTE — Progress Notes (Signed)
Dialysis consents signed by patient and placed in chart.

## 2014-12-25 NOTE — Progress Notes (Signed)
Pt's blood sugar is 195 per [place], NT

## 2014-12-25 NOTE — Progress Notes (Signed)
Pt. CBG 141 per glucometer and NT. Glucometer malfunctioning and not flowing into EPIC results.

## 2014-12-25 NOTE — Care Management (Addendum)
patient presents from home and admitted for pneumonia.  Patient has transportation to physician appointments, no problems accessing medical care or obtaining medical care.  Is current with his PCP- Joseph [name].  Patient is going to require hemodialysis this admission.  He has a fistula but has not required dialysis for the past 6 months or so.  have notified dialysis coordinator.  Unsure it this will be continued after discharge but it is possible

## 2014-12-25 NOTE — Consult Note (Signed)
Cardiology Consultation Note  Patient ID: Joseph [name], MRN: [id number], DOB/AGE: 1945/04/22 69 y.o. Admit date: 12/23/2014   Date of Consult: 12/25/2014 Primary Physician: Marden Noble, MD Primary Cardiologist: Dr. Ellyn Hack, MD  Chief Complaint: Cough, SOB, LOC Reason for Consult: Elevated troponin in the setting of hypotension with SBP of 80 and PNA  HPI: 69 year old male with history of CAD s/p CABG '01 at [facility] (LIMA-LAD, SVG-D2, SVG-OM1, SVG-right PDA, SVG-right PL1) along with multiple PCIs, ISM/chronic systolic CHF (EF 123XX123 [id number]), stage IV CKD (has had dialysis previously), chronic thrombocytopenia, T2DM, prostate cancer who presented to [facility] on 11/16 with complaints of cough, SOB, and LOC.  He had a cardiac catheterization by Dr. Humphrey Rolls in [facility] 07/30/14 revealing all vein grafts occluded, (known dating to '09), patent LIMA to LAD, 70% ostial left main lesion, 70% ostial LCx, occluded RCA. He underwent PCI by Dr. Gwenlyn Found 08/07/14 for critical left main and ostial LCx at 90% and had synergy DES to both. He had some chest pain on 7/20 at cardiac rehab (palpitations, PACs, PAT and bardycardia but no VT on 48 hor holter) leading to observation and negative cardiac markers, elevated d-dimer and VQ performed low risk for PE and ranexa restarted. He had some chest pain during other cardiac rehab and had follow-up 8/5 with uptitration of hydralazine and ranolazine.   He presented with left-sided chest pain and s/s volume overloadon 11/03/2014. He was diuresed with IV Lasix. IV nitroglycerin was continued as well as aspirin, Coreg, Plavix, Crestor, ranolazine. Patient had an acute drop in hemoglobin reports 8.7 to 7.8 despite diuresing 1 L at the time. He was transfused 1 unit of PRBCs. GI consult at and he was heme-negative at the time of exam, however, he ended having Hemoccult-positive stool. Hemoglobin was trending up, so they recommended outpatient evaluation. The patient's  troponin was trending up and peaked at 6.92. Its difficult to know if the patient had a primary NSTEMI or if it was demand ischemia. However, he did very well with cardiac rehabilitation and ambulated 2000 feet with no chest pain. 2-D echocardiogram revealed ejection fraction of 30-35% with moderate diffuse hypokinesis. There was akinesis scarring of the inferior lateral and inferior myocardium. Left atrium was severely dilated. Right ventricle was moderately dilated. Right atrium was mildly dilated. He was also seen in consult by Dr. Caryl Comes with EP and was not considered a candidate for ICD implantation at this time but could reconsider in 3-6 months. He was also seen by nephrology worsening chronic kidney disease who thought he likely had a component of hypoperfusion related to his heart failure on top of his progressively worsening chronic kidney disease. Dr. Jimmy Footman recommended keeping his blood pressure slightly higher than had been. Bicarbonate was increased to 1300 mg twice a day. His Lasix was later increased to 120 mg twice daily which would be his discharge dosing and was reviewed with Dr. Justin Mend. Kayexalate was not resumed at discharge. Potassium has been on the low side during hospitalization. He was weaned off IV nitroglycerin and Imdur was changed to 60 mg twice a day.  Patient was hospitalized down in [place] over in October. He reported developing about 4 out of 10 chest pain while he was down there. He was also short of breath and was given IV Lasix for diuresis. He was started on IV heparin and nitroglycerin. His chest pain resolved his troponin increased to 0.79 he underwent another echocardiogram which revealed ejection fraction proximal 40% left atrium  was mildly dilated, mild mitral regurgitation mild tricuspid regurgitation, right ventricular systolic pressure was [id number] mmHg. LV was mildly dilated. Hemoglobin was stable in [place] at 8.5. He reports today that he lifted a 50 pound  bag of dog food and developed dull chest pain with an intensity of 1/10. It quickly resolved. The patient was in [place] for vacation and during our discussion it sounded like he has not been particularly compliant with his diet. In follow up he continued to have chronic stable angina, but given his renal disease it was felt best to not rush him to the cath lab. He was managed medically.    He presented to [facility] on 11/16 with cough and SOB x 1 week. He apparently suffered a syncopal episode at his PCP office x 3 which led to him coming to the hospital. His BP upon EMS arrival was 80/60. He denies any chest pain, palpitations, diaphoresis, nausea, or vomiting leading up to the syncope. He was seeing his PCP for sore throat and cough. He has been continuing to have his chronic stable angina, though only when he really plays pickle ball very hard. This is unchanged from his baseline.   Upon the patient's arrival to [facility] they were found to have a mildly elevated troponin of 0.08-->0.08-->0.06 ECG showed sinus bradycardia, 55 bpm, baseline wanderer, LVH with secondary early repolarization, inferolateral Q waves, CXR showed right basilar pneumonia. He has been started on Levaquin by IM for his PNA.   Past Medical History  Diagnosis Date  . Hypertension   . Hypercholesterolemia   . Prostate cancer ([place])   . Hepatosplenomegaly 2015    fibrosis, no cirrhosis on 06/2013 liver biopsy  . Thrombocytopenia ([place]) 2015  . Myocardial infarction ([place]) 2001  . Depression with anxiety   . IDDM (insulin dependent diabetes mellitus) ([place])     INSULIN DEPENDENT  . GERD (gastroesophageal reflux disease)   . History of shingles   . Ischemic cardiomyopathy     a. echo 08/06/2014: EF 25-30%, multiple WMA, mod DD, PASP 49 mm Hg   . CKD (chronic kidney disease), stage IV (HCC)     a. previously on dialysis; b. followed by Dr. Abigail Butts   . Coronary artery disease, occlusive     a. s/p 4v CABG 2001(LIMA-LAD, SVG-D2,  SVG-OM1, SVG-right PDA, SVG-right PL1); b. cath 07/30/2014 vein grafts all down, patent LIMA to LAD, significant LM and ost LCx dx; c. cath 08/07/2014 Synergy DES 2.5 mm x 18 mm to dist LM and ost LCx, rec lifelong DAPT  . Coronary artery disease involving coronary bypass graft 08/2014    all vein grafts occluded. Patent LIMA-LAD -- s/p PCI ot LM-Cx;   [name] Chronic combined systolic and diastolic CHF, NYHA class 2 ([place])     a. echo 08/2013: EF 40-45%, impaired relaxation, mild MR, LA mildly dilated,    . Gallstone pancreatitis 2015      Most Recent Cardiac Studies: Echo 12/24/2014: Study Conclusions  - Left ventricle: The cavity size was mildly dilated. Systolic function was moderately to severely reduced. The estimated ejection fraction was in the range of 30% to 35%. Diffuse hypokinesis. - Aortic valve: Valve area (Vmax): 3.01 cm^2. - Mitral valve: There was mild regurgitation.  Echo 11/2014:  EF 40%, technically challenging study   Echo 11/04/2014: Study Conclusions  - Left ventricle: The cavity size was mildly dilated. There was mild concentric hypertrophy. Systolic function was moderately to severely reduced. The estimated ejection fraction was in the range  of 30% to 35%. Moderate diffuse hypokinesis. Akinesis and scarring of the inferolateral and inferior myocardium; consistent with infarction in the distribution of the right coronary artery. Hypokinesis of the basal-midanterolateral myocardium. Doppler parameters are consistent with restrictive physiology, indicative of decreased left ventricular diastolic compliance and/or increased left atrial pressure. - Ventricular septum: Septal motion showed paradox. - Left atrium: The atrium was severely dilated. - Right ventricle: The cavity size was moderately dilated. Systolic function was moderately to severely reduced. - Right atrium: The atrium was mildly dilated.   Surgical History:  Past Surgical  History  Procedure Laterality Date  . Cardiac surgery    . Shoulder arthroscopy    . Penile prosthesis implant    . Myocardial infarction    . Cardiac catheterization    . Coronary angioplasty    . Coronary artery bypass graft  2001  . Colonoscopy    . Liver biopsy    . Av fistula placement Left 2015  . Cardiac catheterization N/A 07/30/2014    Procedure: Left Heart Cath and Cors/Grafts Angiography;  Surgeon: Dionisio David, MD;  Location: [place] [facility];  Service: Cardiovascular;  Laterality: N/A;  . Cardiac catheterization N/A 08/07/2014    Procedure: Coronary Stent Intervention;  Surgeon: Lorretta Harp, MD;  Location: [place] [facility];  Service: Cardiovascular;  Laterality: N/A;     Home Meds: Prior to Admission medications   Medication Sig Start Date End Date Taking? Authorizing Provider  ALPRAZolam Duanne Moron) 0.5 MG tablet Take 0.5 mg by mouth as needed for anxiety.   Yes Historical Provider, MD  aspirin EC 81 MG tablet Take 81 mg by mouth daily.   Yes Historical Provider, MD  calcitRIOL (ROCALTROL) 0.25 MCG capsule Take 0.25 mcg by mouth daily.    Yes Historical Provider, MD  carvedilol (COREG) 6.25 MG tablet Take 1.5 tablets (9.375 mg total) by mouth 2 (two) times daily. 12/09/14  Yes Brett Canales, PA-C  ferrous sulfate 325 (65 FE) MG EC tablet Take 325 mg by mouth daily with breakfast.    Yes Historical Provider, MD  furosemide (LASIX) 40 MG tablet Take 120 mg by mouth 2 (two) times daily.    Yes Historical Provider, MD  hydrALAZINE (APRESOLINE) 50 MG tablet Take 1 tablet (50 mg total) by mouth 2 (two) times daily. 11/08/14  Yes Brett Canales, PA-C  insulin glargine (LANTUS) 100 UNIT/ML injection Inject 20 Units into the skin at bedtime.   Yes Historical Provider, MD  isosorbide mononitrate (IMDUR) 60 MG 24 hr tablet Take 1.5 tablets (90 mg total) by mouth daily. 12/09/14  Yes Brett Canales, PA-C  nitroGLYCERIN (NITROLINGUAL) 0.4 MG/SPRAY spray Place 1 spray under the tongue  every 5 (five) minutes x 3 doses as needed for chest pain. Do not use together with sublingual nitro 08/08/14  Yes Almyra Deforest, PA  pregabalin (LYRICA) 75 MG capsule Take 75 mg by mouth 2 (two) times daily.   Yes Historical Provider, MD  ranolazine (RANEXA) 1000 MG SR tablet Take 1 tablet (1,000 mg total) by mouth 2 (two) times daily. 09/11/14  Yes Christan Defranco M Rithik Odea, PA-C  rosuvastatin (CRESTOR) 20 MG tablet Take 20 mg by mouth at bedtime.    Yes Historical Provider, MD  sodium bicarbonate 650 MG tablet Take 2 tablets (1,300 mg total) by mouth 2 (two) times daily. 11/08/14  Yes Brett Canales, PA-C  ticagrelor (BRILINTA) 90 MG TABS tablet Take 1 tablet (90 mg total) by mouth 2 (two) times  daily. 12/11/14  Yes Brett Canales, PA-C    Inpatient Medications:  . aspirin EC  81 mg Oral Daily  . bacitracin   Topical BID  . calcitRIOL  0.25 mcg Oral Daily  . ferrous sulfate  325 mg Oral Q breakfast  . guaiFENesin  600 mg Oral BID  . insulin aspart  0-5 Units Subcutaneous QHS  . insulin aspart  0-9 Units Subcutaneous TID WC  . insulin glargine  20 Units Subcutaneous QHS  . levofloxacin  500 mg Oral Q48H  . pregabalin  75 mg Oral BID  . rosuvastatin  20 mg Oral QHS  . sodium bicarbonate  1,300 mg Oral BID  . ticagrelor  90 mg Oral BID   . sodium chloride 100 mL/hr at 12/25/14 0451    Allergies:  Allergies  Allergen Reactions  . Sulfa Antibiotics Itching    hives  . Morphine And Related     Makes patient feel weird    Social History   Social History  . Marital Status: Legally Separated    Spouse Name: N/A  . Number of Children: N/A  . Years of Education: N/A   Occupational History  . Not on file.   Social History Main Topics  . Smoking status: Former Smoker    Quit date: 02/07/2008  . Smokeless tobacco: Former Systems developer  . Alcohol Use: No  . Drug Use: No  . Sexual Activity: Not on file   Other Topics Concern  . Not on file   Social History Narrative     Family History  Problem Relation  Age of Onset  . Acute myelogenous leukemia Brother   . CAD Brother   . Diabetes Mellitus II Brother   . Heart disease Father 7    CABG     Review of Systems: Review of Systems  Constitutional: Positive for malaise/fatigue. Negative for fever, chills, weight loss and diaphoresis.  HENT: Positive for congestion and sore throat.   Respiratory: Positive for cough, sputum production, shortness of breath and wheezing. Negative for hemoptysis.   Gastrointestinal: Negative for nausea and vomiting.  Musculoskeletal: Positive for myalgias and falls.  Skin: Negative for rash.  Neurological: Positive for dizziness, loss of consciousness and weakness. Negative for tingling, tremors, sensory change, speech change, focal weakness and seizures.  Endo/Heme/Allergies: Bruises/bleeds easily.  Psychiatric/Behavioral: The patient is nervous/anxious.   All other systems reviewed and are negative.   Labs:  Recent Labs  12/23/14 1554 12/23/14 2206 12/24/14 0340 12/25/14 0756  CKTOTAL  --   --  211  --   TROPONINI 0.09* 0.08* 0.08* 0.06*   Lab Results  Component Value Date   WBC 4.6 12/25/2014   HGB 7.5* 12/25/2014   HCT 23.0* 12/25/2014   MCV 84.9 12/25/2014   PLT 70* 12/25/2014    Recent Labs Lab 12/25/14 0756  NA 137  K 4.5  CL 104  CO2 20*  BUN 115*  CREATININE 7.51*  CALCIUM 7.5*  GLUCOSE 221*   Lab Results  Component Value Date   CHOL 113 11/04/2014   HDL 26* 11/04/2014   LDLCALC 53 11/04/2014   TRIG 168* 11/04/2014   Lab Results  Component Value Date   DDIMER 0.75* 08/26/2014    Radiology/Studies:  Dg Chest 2 View  12/23/2014  CLINICAL DATA:  Syncopal episode this morning, fell several times, cough, CHF, hypertension, diabetes mellitus, former smoker EXAM: CHEST  2 VIEW COMPARISON:  11/03/2014 FINDINGS: Enlargement of cardiac silhouette post CABG. Mediastinal contours and pulmonary  vascularity normal. RIGHT basilar infiltrate consistent with pneumonia. Remaining  lungs clear. No pleural effusion or pneumothorax. IMPRESSION: Enlargement of cardiac silhouette post CABG. RIGHT basilar infiltrate consistent with pneumonia. Electronically Signed   By: Lavonia Dana M.D.   On: 12/23/2014 10:49    EKG: sinus bradycardia, 55 bpm, baseline wanderer, LVH with secondary early repolarization, inferolateral Q waves  Weights: Filed Weights   12/23/14 0951 12/23/14 1529  Weight: 196 lb (88.905 kg) 200 lb 0.8 oz (90.742 kg)     Physical Exam: Blood pressure 133/48, pulse 70, temperature 97.6 F (36.4 C), temperature source Oral, resp. rate 18, height 5\' 10"  (1.778 m), weight 200 lb 0.8 oz (90.742 kg), SpO2 98 %. Body mass index is 28.7 kg/(m^2). General: Well developed, well nourished, in no acute distress. Head: Normocephalic, atraumatic, sclera non-icteric, no xanthomas, nares are without discharge.  Neck: Negative for carotid bruits. JVD not elevated. Lungs: Rhonchi right base, decreased breath sounds throughout. Breathing is unlabored. Heart: RRR with S1 S2. No murmurs, rubs, or gallops appreciated. Abdomen: Soft, non-tender, non-distended with normoactive bowel sounds. No hepatomegaly. No rebound/guarding. No obvious abdominal masses. Msk:  Strength and tone appear normal for age. Extremities: No clubbing or cyanosis. No edema.  Distal pedal pulses are 2+ and equal bilaterally. Neuro: Alert and oriented X 3. No facial asymmetry. No focal deficit. Moves all extremities spontaneously. Psych:  Responds to questions appropriately with a normal affect.    Assessment and Plan:   1. Demand ischemia: -In the setting of his right basilar PNA, hypotension with SBP of 80, and anemia of 7.5 -Patient with recent echo 10/2014 that demonstrated EF 30-35%, moderate diffuse HK with AK and scarring of the inferolateral and inferior myocardium; consistent with infarction in the distribution of the RCA -Echo was repeated by IM on 12/24/2014 that showed no change in LV function  with EF of 30-35%, diffuse HK, mild MR  2. CAD status post stenting as above: -Develops chronic angina when he really exerts himself playing "pickle ball" -Continue DAPT with aspirin 81 mg daily and Brilinta 90 mg bid -Would not rush to take the patient to the cath lab given his renal disease -He has known chronic stable angina  -Symptoms likely exacerbated in the setting of his PNA, acute on chronic anemia, and hypotension -Would treat the underlying causes first. Should his symptoms persist could then possibly pursue ischemic evaluation with treadmill Myoview as an outpatient when his is feeling better -Should patient be placed on dialysis could plan for cardiac cath as an outpatient, but only after he is placed on dialysis  -Hypotension resolved -Restart HF medications slowly   3. Ischemic cardiomyopathy: -He does not appear volume overloaded -Hypotension resolved, restart HF medications slowly  -Echo as above, this is his 3rd echo in three months that has not shown any change  4. PNA: -Per IM  5. Acute on chronic anemia: -Recommend transfusing to hgb of 8 to 8.5  6. ESRD: -Renal on board -Avoid nephrotoxic agents -Possibly restarting dialysis  7. Syncope: -Likely in the setting of hypotension as he has had an issue with this in the past -BPs improved -Monitor for arrhythmias   Signed, Christell Faith, PA-C Pager: [phone] 12/25/2014, 11:38 AM

## 2014-12-25 NOTE — Care Management Important Message (Signed)
Important Message  Patient Details  Name: Joseph [name] MRN: [id number] Date of Birth: 07/18/1945   Medicare Important Message Given:  Yes    Katrina Stack, RN 12/25/2014, 5:45 PM

## 2014-12-25 NOTE — Progress Notes (Addendum)
MEDICATION RELATED CONSULT NOTE -FOLLOW UP  Pharmacy Consult for renal dose adjustment of antibiotics Indication: levofloxacin for PNA  Allergies  Allergen Reactions  . Sulfa Antibiotics Itching    hives  . Morphine And Related     Makes patient feel weird    Patient Measurements: Height: 5\' 10"  (177.8 cm) Weight: 200 lb 0.8 oz (90.742 kg) IBW/kg (Calculated) : 73   Vital Signs: Temp: 97.6 F (36.4 C) (11/18 0401) Temp Source: Oral (11/18 0401) BP: 133/48 mmHg (11/18 0813) Pulse Rate: 70 (11/18 0813) Intake/Output from previous day: 11/17 0701 - 11/18 0700 In: 2341.7 [P.O.:240; I.V.:2101.7] Out: 2825 [Urine:2825] Intake/Output from this shift:    Labs:  Recent Labs  12/23/14 0955 12/23/14 1554 12/25/14 0439  WBC 6.0 4.5 4.6  HGB 8.1* 7.5* 7.5*  HCT 24.4* 22.5* 23.0*  PLT 90* 77* 70*  CREATININE 8.66* 8.61*  --    Estimated Creatinine Clearance: 9.6 mL/min (by C-G formula based on Cr of 8.61).   Microbiology: Recent Results (from the past 720 hour(s))  Culture, blood (routine x 2) Call MD if unable to obtain prior to antibiotics being given     Status: None (Preliminary result)   Collection Time: 12/23/14  3:54 PM  Result Value Ref Range Status   Specimen Description BLOOD RIGHT ASSIST CONTROL  Final   Special Requests   Final    BOTTLES DRAWN AEROBIC AND ANAEROBIC 8CC AERO 5CC ANA   Culture NO GROWTH 2 DAYS  Final   Report Status PENDING  Incomplete  Culture, blood (routine x 2) Call MD if unable to obtain prior to antibiotics being given     Status: None (Preliminary result)   Collection Time: 12/23/14  5:24 PM  Result Value Ref Range Status   Specimen Description BLOOD RIGHT HAND  Final   Special Requests BOTTLES DRAWN AEROBIC AND ANAEROBIC 4CC  Final   Culture NO GROWTH 2 DAYS  Final   Report Status PENDING  Incomplete  Culture, sputum-assessment     Status: None   Collection Time: 12/24/14 12:38 PM  Result Value Ref Range Status   Specimen  Description EXPECTORATED SPUTUM  Final   Special Requests NONE  Final   Sputum evaluation THIS SPECIMEN IS ACCEPTABLE FOR SPUTUM CULTURE  Final   Report Status 12/24/2014 FINAL  Final    Medical History: Past Medical History  Diagnosis Date  . Hypertension   . Hypercholesterolemia   . Prostate cancer ([place])   . Hepatosplenomegaly 2015    fibrosis, no cirrhosis on 06/2013 liver biopsy  . Thrombocytopenia ([place]) 2015  . Myocardial infarction ([place]) 2001  . Depression with anxiety   . IDDM (insulin dependent diabetes mellitus) ([place])     INSULIN DEPENDENT  . GERD (gastroesophageal reflux disease)   . History of shingles   . Ischemic cardiomyopathy     a. echo 08/06/2014: EF 25-30%, multiple WMA, mod DD, PASP 49 mm Hg   . CKD (chronic kidney disease), stage IV (HCC)     a. previously on dialysis; b. followed by Dr. Abigail Butts   . Coronary artery disease, occlusive     a. s/p 4v CABG 2001(LIMA-LAD, SVG-D2, SVG-OM1, SVG-right PDA, SVG-right PL1); b. cath 07/30/2014 vein grafts all down, patent LIMA to LAD, significant LM and ost LCx dx; c. cath 08/07/2014 Synergy DES 2.5 mm x 18 mm to dist LM and ost LCx, rec lifelong DAPT  . Coronary artery disease involving coronary bypass graft 08/2014    all vein grafts occluded.  Patent LIMA-LAD -- s/p PCI ot LM-Cx;   [name] Chronic combined systolic and diastolic CHF, NYHA class 2 ([place])     a. echo 08/2013: EF 40-45%, impaired relaxation, mild MR, LA mildly dilated,    . Gallstone pancreatitis 2015    Assessment: Pharmacy consulted to adjust doses of antibiotics per renal function. Patient received levofloxacin 750 mg IV today in ED and was prescribed levofloxacin 750 mg PO daily for PNA.  Patient has a SCr of 8.66. Per MD note, patient received dialysis for a period of approximately 4 months last year but reports not being on dialysis for the past year. Nephrology consulted but has not seen the patient yet. Estimated creatinine clearance today is ~9.5  mL/min.  Plan:  Will continue levofloxacin 500 mg PO q48 hours.   Will follow renal function and adjust dose if needed per protocol. Thank you for the consult.  Vianka Ertel D 12/25/2014,10:39 AM

## 2014-12-25 NOTE — Progress Notes (Signed)
[facility]  ROUNDING NOTE   Subjective:  Patient had chest pain earlier this a.m. That was relieved with nitroglycerin. bUN and creatinine remain quite high. Discuss potential for restarting dialysis in depth with the patient. He appears to be agreeable to restarting dialysis.    Objective:  Vital signs in last 24 hours:  Temp:  [97.6 F (36.4 C)-98.7 F (37.1 C)] 98.7 F (37.1 C) (11/18 1147) Pulse Rate:  [66-70] 67 (11/18 1147) Resp:  [18-20] 18 (11/18 1147) BP: (113-185)/(48-76) 145/65 mmHg (11/18 1147) SpO2:  [96 %-98 %] 98 % (11/18 1147)  Weight change:  Filed Weights   12/23/14 0951 12/23/14 1529  Weight: 88.905 kg (196 lb) 90.742 kg (200 lb 0.8 oz)    Intake/Output: I/O last 3 completed shifts: In: 2341.7 [P.O.:240; I.V.:2101.7] Out: 3125 [Urine:3125]   Intake/Output this shift:     Physical Exam: General: NAD  Head: Normocephalic, atraumatic. Moist oral mucosal membranes  Eyes: Anicteric  Neck: Supple, trachea midline  Lungs:  Clear to auscultation normal effort  Heart: S1S2 no rubs  Abdomen:  Soft, nontender, BS present.   Extremities:  trace peripheral edema.  Neurologic: Nonfocal, moving all four extremities  Skin: No lesions  Access: LUE AVF with good bruit and thrill    Basic Metabolic Panel:  Recent Labs Lab 12/23/14 0955 12/23/14 1554 12/25/14 0756  NA 136  --  137  K 4.9  --  4.5  CL 98*  --  104  CO2 23  --  20*  GLUCOSE 170*  --  221*  BUN 131*  --  115*  CREATININE 8.66* 8.61* 7.51*  CALCIUM 8.1*  --  7.5*    Liver Function Tests: No results for input(s): AST, ALT, ALKPHOS, BILITOT, PROT, ALBUMIN in the last 168 hours. No results for input(s): LIPASE, AMYLASE in the last 168 hours. No results for input(s): AMMONIA in the last 168 hours.  CBC:  Recent Labs Lab 12/23/14 0955 12/23/14 1554 12/25/14 0439  WBC 6.0 4.5 4.6  HGB 8.1* 7.5* 7.5*  HCT 24.4* 22.5* 23.0*  MCV 84.4 85.0 84.9  PLT 90* 77* 70*     Cardiac Enzymes:  Recent Labs Lab 12/23/14 0955 12/23/14 1554 12/23/14 2206 12/24/14 0340 12/25/14 0756  CKTOTAL  --   --   --  211  --   TROPONINI 0.11* 0.09* 0.08* 0.08* 0.06*    BNP: Invalid input(s): POCBNP  CBG:  Recent Labs Lab 12/24/14 0757 12/24/14 1142 12/24/14 1638 12/24/14 1947 12/25/14 0803  GLUCAP 123* 169* 121* 132* 229*    Microbiology: Results for orders placed or performed during the hospital encounter of 12/23/14  Culture, blood (routine x 2) Call MD if unable to obtain prior to antibiotics being given     Status: None (Preliminary result)   Collection Time: 12/23/14  3:54 PM  Result Value Ref Range Status   Specimen Description BLOOD RIGHT ASSIST CONTROL  Final   Special Requests   Final    BOTTLES DRAWN AEROBIC AND ANAEROBIC 8CC AERO 5CC ANA   Culture NO GROWTH 2 DAYS  Final   Report Status PENDING  Incomplete  Culture, blood (routine x 2) Call MD if unable to obtain prior to antibiotics being given     Status: None (Preliminary result)   Collection Time: 12/23/14  5:24 PM  Result Value Ref Range Status   Specimen Description BLOOD RIGHT HAND  Final   Special Requests BOTTLES DRAWN AEROBIC AND ANAEROBIC 4CC  Final  Culture NO GROWTH 2 DAYS  Final   Report Status PENDING  Incomplete  Culture, sputum-assessment     Status: None   Collection Time: 12/24/14 12:38 PM  Result Value Ref Range Status   Specimen Description EXPECTORATED SPUTUM  Final   Special Requests NONE  Final   Sputum evaluation THIS SPECIMEN IS ACCEPTABLE FOR SPUTUM CULTURE  Final   Report Status 12/24/2014 FINAL  Final  Culture, respiratory (NON-Expectorated)     Status: None (Preliminary result)   Collection Time: 12/24/14 12:38 PM  Result Value Ref Range Status   Specimen Description EXPECTORATED SPUTUM  Final   Special Requests NONE Reflexed from [id number]  Final   Gram Stain PENDING  Incomplete   Culture HOLDING FOR POSSIBLE PATHOGEN  Final   Report Status PENDING   Incomplete    Coagulation Studies: No results for input(s): LABPROT, INR in the last 72 hours.  Urinalysis:  Recent Labs  12/23/14 1206  COLORURINE YELLOW*  LABSPEC 1.011  PHURINE 5.0  GLUCOSEU NEGATIVE  HGBUR NEGATIVE  BILIRUBINUR NEGATIVE  KETONESUR NEGATIVE  PROTEINUR 100*  NITRITE NEGATIVE  LEUKOCYTESUR NEGATIVE      Imaging: No results found.   Medications:   . sodium chloride 100 mL/hr at 12/25/14 0451   . aspirin EC  81 mg Oral Daily  . bacitracin   Topical BID  . calcitRIOL  0.25 mcg Oral Daily  . ferrous sulfate  325 mg Oral Q breakfast  . guaiFENesin  600 mg Oral BID  . insulin aspart  0-5 Units Subcutaneous QHS  . insulin aspart  0-9 Units Subcutaneous TID WC  . insulin glargine  20 Units Subcutaneous QHS  . isosorbide mononitrate  30 mg Oral Daily  . levofloxacin  500 mg Oral Q48H  . pregabalin  75 mg Oral BID  . rosuvastatin  20 mg Oral QHS  . sodium bicarbonate  1,300 mg Oral BID  . ticagrelor  90 mg Oral BID   acetaminophen, ALPRAZolam, hydrALAZINE, ipratropium-albuterol, nitroGLYCERIN, ondansetron (ZOFRAN) IV, zolpidem  Assessment/ Plan:  69 y.o. male  with coronary artery disease status post CABG, diabetes mellitus type 2, hypertension, prostate cancer, hyperlipidemia presents as a follow up patient for chronic kidney disease stage V/ESRD, anemia of CKD, SHPTH  1.  End-stage renal disease.  Over the past several months the patient has had progressive renal dysfunction.EGFR remains quite low at 7. Therefore we will restart the patient on hemodialysis day.  We will perform a dialysis treatment for 1.5 hours today.  We will plan for dialysis again tomorrow as well.  2.  Anemia chronic kidney disease.  Restart Epogen 10,000 units IV with dialysis.  3.  Secondary hyperparathyroidism.  We will check PTH and phosphorus with next dialysis treatment.  4. Metabolic acidosis:  We will stop sodium bicarbonate tablets now.   LOS: 2 Daveon Arpino,  Michalle Rademaker 11/18/201612:34 PM

## 2014-12-25 NOTE — Progress Notes (Signed)
[facility] [place] NAME: Anir Rosener    MR#:  [id number]  DATE OF BIRTH:  [birth year]  SUBJECTIVE:  CHIEF COMPLAINT:   Chief Complaint  Patient presents with  . Loss of Consciousness  . Hypotension   patient is 69 year old Caucasian male with past medical history significant for history of renal disease, left upper extremity AV fistula , chronic systolic congestive heart failure, previous mellitus, coronary artery disease who presents to the hospital with complaints of cough, shortness of breath and passing out. Patient has been having cough approximately 1 week now. He was noted to be hypotensive and passed out. His blood pressure was 80/60. In emergency room, he was given IV fluids and improved clinically. He was noted to have pneumonia on chest x-ray and was admitted to the hospital. He feels a little bit better today. Nephrologist initiated hemodialysis today . Next hemodialysis tomorrow. Blood pressure is better. Patient had left-sided chest pains early in the morning today, relieved by nitroglycerin. Etiology recommends to restart home medications slowly, but no invasive evaluation was recommended  Review of Systems  Constitutional: Negative for fever, chills and weight loss.  HENT: Negative for congestion.   Eyes: Negative for blurred vision and double vision.  Respiratory: Positive for cough and shortness of breath. Negative for sputum production and wheezing.   Cardiovascular: Negative for chest pain, palpitations, orthopnea, leg swelling and PND.  Gastrointestinal: Negative for nausea, vomiting, abdominal pain, diarrhea, constipation and blood in stool.  Genitourinary: Negative for dysuria, urgency, frequency and hematuria.  Musculoskeletal: Negative for falls.  Neurological: Negative for dizziness, tremors, focal weakness and headaches.  Endo/Heme/Allergies: Does not bruise/bleed easily.  Psychiatric/Behavioral: Negative for  depression. The patient does not have insomnia.     VITAL SIGNS: Blood pressure 145/65, pulse 70, temperature 97.3 F (36.3 C), temperature source Oral, resp. rate 28, height 5\' 10"  (1.778 m), weight 90.742 kg (200 lb 0.8 oz), SpO2 97 %.  PHYSICAL EXAMINATION:   GENERAL:  69 y.o.-year-old patient lying in the bed with no acute distress.  EYES: Pupils equal, round, reactive to light and accommodation. No scleral icterus. Extraocular muscles intact.  HEENT: Head atraumatic, normocephalic. Oropharynx and nasopharynx clear.  NECK:  Supple, no jugular venous distention. No thyroid enlargement, no tenderness.  LUNGS: Normal breath sounds bilaterally, no wheezing, rales,rhonchi , few right-sided crepitations posteriorly. No use of accessory muscles of respiration.  CARDIOVASCULAR: S1, S2 normal. No murmurs, rubs, or gallops.  ABDOMEN: Soft, nontender, nondistended. Bowel sounds present. No organomegaly or mass.  EXTREMITIES: No pedal edema, cyanosis, or clubbing. Left upper extremity AV fistula with good bruit and thrill NEUROLOGIC: Cranial nerves II through XII are intact. Muscle strength 5/5 in all extremities. Sensation intact. Gait not checked.  PSYCHIATRIC: The patient is alert and oriented x 3.  SKIN: No obvious rash, lesion, or ulcer.   ORDERS/RESULTS REVIEWED:   CBC  Recent Labs Lab 12/23/14 0955 12/23/14 1554 12/25/14 0439  WBC 6.0 4.5 4.6  HGB 8.1* 7.5* 7.5*  HCT 24.4* 22.5* 23.0*  PLT 90* 77* 70*  MCV 84.4 85.0 84.9  MCH 28.1 28.2 27.9  MCHC 33.3 33.2 32.9  RDW 16.4* 16.1* 17.0*   ------------------------------------------------------------------------------------------------------------------  Chemistries   Recent Labs Lab 12/23/14 0955 12/23/14 1554 12/25/14 0756  NA 136  --  137  K 4.9  --  4.5  CL 98*  --  104  CO2 23  --  20*  GLUCOSE 170*  --  221*  BUN 131*  --  115*  CREATININE 8.66* 8.61* 7.51*  CALCIUM 8.1*  --  7.5*    ------------------------------------------------------------------------------------------------------------------ estimated creatinine clearance is 11 mL/min (by C-G formula based on Cr of 7.51). ------------------------------------------------------------------------------------------------------------------ No results for input(s): TSH, T4TOTAL, T3FREE, THYROIDAB in the last 72 hours.  Invalid input(s): FREET3  Cardiac Enzymes  Recent Labs Lab 12/23/14 2206 12/24/14 0340 12/25/14 0756  TROPONINI 0.08* 0.08* 0.06*   ------------------------------------------------------------------------------------------------------------------ Invalid input(s): POCBNP ---------------------------------------------------------------------------------------------------------------  RADIOLOGY: No results found.  EKG:  Orders placed or performed during the hospital encounter of 12/23/14  . ED EKG  . ED EKG  . EKG 12-Lead  . EKG 12-Lead  . EKG 12-Lead  . EKG 12-Lead  . EKG 12-Lead  . EKG 12-Lead  . EKG 12-Lead  . EKG 12-Lead    ASSESSMENT AND PLAN:  Active Problems:   PNA (pneumonia) 1. Syncope, likely due to hypotension,.blood pressure has  improved. Restart low-dose of Coreg, following blood pressure readings closely . Discontinue IV fluids  2. Hypotension,, discontinue intravenous fluids , likely sepsis related,improved,resuming lower dose of Coreg,  still holding hydralazine,Imdur  3. Right lower lobe bacterial pneumonia due to unknown infectious agent, sputum cultures Requested, pending  Continue patient on levofloxacin. Blood cultures are negative so far 4. End-stage renal disease, nephrologist initiated  hemodialysis via left upper extremity AV fistulaToday , next tomorrow , they should nephrologist input  5. Anemia of chronic disease with hemoglobin level of 7.5, patient will be receiving epogen with hemodialysis 6. Chronic thrombocytopenia, stable 7. Elevated troponin, likely  demand ischemia due to hypotension,. Appreciate cardiologist  evaluation. . Stable echocardiogram, Patient did have chest pain today, restart Coreg, cardiologist, does not recommend invasive  evaluation at this time    Management plans discussed with the patient, family and they are in agreement.   DRUG ALLERGIES:  Allergies  Allergen Reactions  . Sulfa Antibiotics Itching    hives  . Morphine And Related     Makes patient feel weird    CODE STATUS:     Code Status Orders        Start     Ordered   12/23/14 1535  Full code   Continuous     12/23/14 1534    Advance Directive Documentation        Most Recent Value   Type of Advance Directive  Healthcare Power of Attorney   Pre-existing out of facility DNR order (yellow form or pink MOST form)     "MOST" Form in Place?        TOTAL TIME TAKING CARE OF THIS PATIENT: 40es.    Theodoro Grist M.D on 12/25/2014 at 3:05 PM  Between 7am to 6pm - Pager - [phone]  After 6pm go to [email or web address] - password [email or web address] [facility]  Office  [phone]  CC: Primary care physician; Marden Noble, MD

## 2014-12-26 DIAGNOSIS — J181 Lobar pneumonia, unspecified organism: Secondary | ICD-10-CM

## 2014-12-26 DIAGNOSIS — J189 Pneumonia, unspecified organism: Secondary | ICD-10-CM

## 2014-12-26 LAB — HEPATITIS C ANTIBODY: HCV Ab: <0.1 s/co ratio (ref 0.0–0.9)

## 2014-12-26 LAB — CBC
HCT: 21.6 % — ABNORMAL LOW (ref 40.0–52.0)
Hemoglobin: 7.3 g/dL — ABNORMAL LOW (ref 13.0–18.0)
MCH: 28.4 pg (ref 26.0–34.0)
MCHC: 33.8 g/dL (ref 32.0–36.0)
MCV: 84.2 fL (ref 80.0–100.0)
PLATELETS: 73 10*3/uL — AB (ref 150–440)
RBC: 2.57 MIL/uL — AB (ref 4.40–5.90)
RDW: 16.6 % — AB (ref 11.5–14.5)
WBC: 4.7 10*3/uL (ref 3.8–10.6)

## 2014-12-26 LAB — GLUCOSE, CAPILLARY
GLUCOSE-CAPILLARY: 106 mg/dL — AB (ref 65–99)
GLUCOSE-CAPILLARY: 162 mg/dL — AB (ref 65–99)
GLUCOSE-CAPILLARY: 117 mg/dL — AB (ref 65–99)

## 2014-12-26 LAB — RENAL FUNCTION PANEL
ANION GAP: 12 (ref 5–15)
Albumin: 2.9 g/dL — ABNORMAL LOW (ref 3.5–5.0)
BUN: 92 mg/dL — ABNORMAL HIGH (ref 6–20)
CALCIUM: 8 mg/dL — AB (ref 8.9–10.3)
CHLORIDE: 107 mmol/L (ref 101–111)
CO2: 23 mmol/L (ref 22–32)
CREATININE: 5.84 mg/dL — AB (ref 0.61–1.24)
GFR, EST AFRICAN AMERICAN: 10 mL/min — AB (ref >60)
GFR, EST NON AFRICAN AMERICAN: 9 mL/min — AB (ref >60)
Glucose, Bld: 167 mg/dL — ABNORMAL HIGH (ref 65–99)
Phosphorus: 5.1 mg/dL — ABNORMAL HIGH (ref 2.5–4.6)
Potassium: 4.2 mmol/L (ref 3.5–5.1)
SODIUM: 142 mmol/L (ref 135–145)

## 2014-12-26 LAB — HEPATITIS B SURFACE ANTIBODY, QUANTITATIVE: Hepatitis B-Post: <3.1 m[IU]/mL — ABNORMAL LOW (ref >9.9)

## 2014-12-26 LAB — PHOSPHORUS: Phosphorus: 3.1 mg/dL (ref 2.5–4.6)

## 2014-12-26 LAB — HEPATITIS B SURFACE ANTIGEN: HEP B S AG: NEGATIVE

## 2014-12-26 MED ORDER — EPOETIN ALFA 10000 UNIT/ML IJ SOLN: 10000.0000 [IU] | INTRAMUSCULAR | Status: DC

## 2014-12-26 MED ORDER — SODIUM CHLORIDE 0.9 % IJ SOLN
3.0000 mL | Freq: Two times a day (BID) | INTRAMUSCULAR | Status: DC
  Administered 2014-12-26 – 2014-12-28 (×4): 3 mL via INTRAVENOUS

## 2014-12-26 MED ORDER — SODIUM CHLORIDE 0.9 % IJ SOLN: 3.0000 mL | INTRAMUSCULAR | Status: DC | PRN

## 2014-12-26 NOTE — Progress Notes (Signed)
[facility]  ROUNDING NOTE   Subjective:  Patient seen at bedside. Had first HD treatment yesterday. Due for second treatment today.     Objective:  Vital signs in last 24 hours:  Temp:  [97.3 F (36.3 C)-98.7 F (37.1 C)] 98.2 F (36.8 C) (11/19 1107) Pulse Rate:  [65-81] 77 (11/19 1107) Resp:  [16-28] 21 (11/19 1107) BP: (128-158)/(58-79) 135/64 mmHg (11/19 1107) SpO2:  [94 %-99 %] 95 % (11/19 1107) Weight:  [91.128 kg (200 lb 14.4 oz)-95.4 kg (210 lb 5.1 oz)] 91.128 kg (200 lb 14.4 oz) (11/18 1649)  Weight change:  Filed Weights   12/25/14 1450 12/25/14 1626 12/25/14 1649  Weight: 95.4 kg (210 lb 5.1 oz) 95.21 kg (209 lb 14.4 oz) 91.128 kg (200 lb 14.4 oz)    Intake/Output: I/O last 3 completed shifts: In: 2418.3 [P.O.:240; I.V.:2178.3] Out: 2619 [Urine:2700]   Intake/Output this shift:  Total I/O In: 360 [P.O.:360] Out: 1 [Stool:1]  Physical Exam: General: NAD  Head: Normocephalic, atraumatic. Moist oral mucosal membranes  Eyes: Anicteric  Neck: Supple, trachea midline  Lungs:  Clear to auscultation normal effort  Heart: S1S2 no rubs  Abdomen:  Soft, nontender, BS present.   Extremities:  trace peripheral edema.  Neurologic: Nonfocal, moving all four extremities  Skin: No lesions  Access: LUE AVF with good bruit and thrill    Basic Metabolic Panel:  Recent Labs Lab 12/23/14 0955 12/23/14 1554 12/25/14 0756  NA 136  --  137  K 4.9  --  4.5  CL 98*  --  104  CO2 23  --  20*  GLUCOSE 170*  --  221*  BUN 131*  --  115*  CREATININE 8.66* 8.61* 7.51*  CALCIUM 8.1*  --  7.5*    Liver Function Tests: No results for input(s): AST, ALT, ALKPHOS, BILITOT, PROT, ALBUMIN in the last 168 hours. No results for input(s): LIPASE, AMYLASE in the last 168 hours. No results for input(s): AMMONIA in the last 168 hours.  CBC:  Recent Labs Lab 12/23/14 0955 12/23/14 1554 12/25/14 0439  WBC 6.0 4.5 4.6  HGB 8.1* 7.5* 7.5*  HCT 24.4* 22.5*  23.0*  MCV 84.4 85.0 84.9  PLT 90* 77* 70*    Cardiac Enzymes:  Recent Labs Lab 12/23/14 0955 12/23/14 1554 12/23/14 2206 12/24/14 0340 12/25/14 0756  CKTOTAL  --   --   --  211  --   TROPONINI 0.11* 0.09* 0.08* 0.08* 0.06*    BNP: Invalid input(s): POCBNP  CBG:  Recent Labs Lab 12/24/14 1947 12/25/14 0803 12/25/14 2123 12/26/14 0729 12/26/14 1121  GLUCAP 132* 229* 195* 106* 162*    Microbiology: Results for orders placed or performed during the hospital encounter of 12/23/14  Culture, blood (routine x 2) Call MD if unable to obtain prior to antibiotics being given     Status: None (Preliminary result)   Collection Time: 12/23/14  3:54 PM  Result Value Ref Range Status   Specimen Description BLOOD RIGHT ASSIST CONTROL  Final   Special Requests   Final    BOTTLES DRAWN AEROBIC AND ANAEROBIC 8CC AERO 5CC ANA   Culture NO GROWTH 3 DAYS  Final   Report Status PENDING  Incomplete  Culture, blood (routine x 2) Call MD if unable to obtain prior to antibiotics being given     Status: None (Preliminary result)   Collection Time: 12/23/14  5:24 PM  Result Value Ref Range Status   Specimen Description BLOOD RIGHT HAND  Final   Special Requests BOTTLES DRAWN AEROBIC AND ANAEROBIC 4CC  Final   Culture NO GROWTH 3 DAYS  Final   Report Status PENDING  Incomplete  Culture, sputum-assessment     Status: None   Collection Time: 12/24/14 12:38 PM  Result Value Ref Range Status   Specimen Description EXPECTORATED SPUTUM  Final   Special Requests NONE  Final   Sputum evaluation THIS SPECIMEN IS ACCEPTABLE FOR SPUTUM CULTURE  Final   Report Status 12/24/2014 FINAL  Final  Culture, respiratory (NON-Expectorated)     Status: None (Preliminary result)   Collection Time: 12/24/14 12:38 PM  Result Value Ref Range Status   Specimen Description EXPECTORATED SPUTUM  Final   Special Requests NONE Reflexed from [id number]  Final   Gram Stain PENDING  Incomplete   Culture HOLDING FOR  POSSIBLE PATHOGEN  Final   Report Status PENDING  Incomplete    Coagulation Studies: No results for input(s): LABPROT, INR in the last 72 hours.  Urinalysis: No results for input(s): COLORURINE, LABSPEC, PHURINE, GLUCOSEU, HGBUR, BILIRUBINUR, KETONESUR, PROTEINUR, UROBILINOGEN, NITRITE, LEUKOCYTESUR in the last 72 hours.  Invalid input(s): APPERANCEUR    Imaging: No results found.   Medications:     . aspirin EC  81 mg Oral Daily  . bacitracin   Topical BID  . calcitRIOL  0.25 mcg Oral Daily  . carvedilol  3.125 mg Oral BID WC  . ferrous sulfate  325 mg Oral Q breakfast  . guaiFENesin  600 mg Oral BID  . insulin aspart  0-5 Units Subcutaneous QHS  . insulin aspart  0-9 Units Subcutaneous TID WC  . insulin glargine  20 Units Subcutaneous QHS  . isosorbide mononitrate  30 mg Oral Daily  . levofloxacin  500 mg Oral Q48H  . pregabalin  75 mg Oral BID  . rosuvastatin  20 mg Oral QHS  . sodium bicarbonate  1,300 mg Oral BID  . ticagrelor  90 mg Oral BID  . tuberculin  5 Units Intradermal Once   acetaminophen, ALPRAZolam, hydrALAZINE, ipratropium-albuterol, nitroGLYCERIN, ondansetron (ZOFRAN) IV, zolpidem  Assessment/ Plan:  69 y.o. male  with coronary artery disease status post CABG, diabetes mellitus type 2, hypertension, prostate cancer, hyperlipidemia presents as a follow up patient for chronic kidney disease stage V/ESRD, anemia of CKD, SHPTH  1.  End-stage renal disease.  Over the past several months the patient has had progressive renal dysfunction.EGFR remains quite low at 7. -Pt restarted dialysis yesterday, will plan for second treatment today.  Pt was previously dialyzing at [facility]  in [place], states he would like to go back there for chronic outpt HD.   2.  Anemia chronic kidney disease.  Pt to receive epogen 10000 units IV with HD today.  3.  Secondary hyperparathyroidism.  Follow up phos today.    4. Metabolic acidosis:  Was on sodium bicarb tabs, no  longer needed with HD.   LOS: 3 Canda Podgorski 11/19/201612:19 PM

## 2014-12-26 NOTE — Progress Notes (Signed)
[place] at [place] NAME: Joseph [name]    MR#:  [id number]  DATE OF BIRTH:  [birth year]  SUBJECTIVE:  CHIEF COMPLAINT:   Chief Complaint  Patient presents with  . Loss of Consciousness  . Hypotension   patient seen in hemodialysis, cough improved, still short of breath  Review of Systems  Constitutional: Negative for fever, chills and weight loss.  HENT: Negative for congestion.   Eyes: Negative for blurred vision and double vision.  Respiratory: Positive for cough and shortness of breath. Negative for sputum production and wheezing.   Cardiovascular: Negative for chest pain, palpitations, orthopnea, leg swelling and PND.  Gastrointestinal: Negative for nausea, vomiting, abdominal pain, diarrhea, constipation and blood in stool.  Genitourinary: Negative for dysuria, urgency, frequency and hematuria.  Musculoskeletal: Negative for falls.  Neurological: Negative for dizziness, tremors, focal weakness and headaches.  Endo/Heme/Allergies: Does not bruise/bleed easily.  Psychiatric/Behavioral: Negative for depression. The patient does not have insomnia.     VITAL SIGNS: Blood pressure 124/62, pulse 68, temperature 98.2 F (36.8 C), temperature source Oral, resp. rate 26, height 5\' 10"  (1.778 m), weight 91.128 kg (200 lb 14.4 oz), SpO2 95 %.  PHYSICAL EXAMINATION:   GENERAL:  69 y.o.-year-old patient lying in the bed with no acute distress.  EYES: Pupils equal, round, reactive to light and accommodation. No scleral icterus. Extraocular muscles intact.  HEENT: Head atraumatic, normocephalic. Oropharynx and nasopharynx clear.  NECK:  Supple, no jugular venous distention. No thyroid enlargement, no tenderness.  LUNGS: Normal breath sounds bilaterally, no wheezing, rales,rhonchi , few right-sided crepitations posteriorly. No use of accessory muscles of respiration.  CARDIOVASCULAR: S1, S2 normal. No murmurs, rubs, or gallops.   ABDOMEN: Soft, nontender, nondistended. Bowel sounds present. No organomegaly or mass.  EXTREMITIES: No pedal edema, cyanosis, or clubbing. Left upper extremity AV fistula with good bruit and thrill NEUROLOGIC: Cranial nerves II through XII are intact. Muscle strength 5/5 in all extremities. Sensation intact. Gait not checked.  PSYCHIATRIC: The patient is alert and oriented x 3.  SKIN: No obvious rash, lesion, or ulcer.   ORDERS/RESULTS REVIEWED:   CBC  Recent Labs Lab 12/23/14 0955 12/23/14 1554 12/25/14 0439 12/26/14 1208  WBC 6.0 4.5 4.6 4.7  HGB 8.1* 7.5* 7.5* 7.3*  HCT 24.4* 22.5* 23.0* 21.6*  PLT 90* 77* 70* 73*  MCV 84.4 85.0 84.9 84.2  MCH 28.1 28.2 27.9 28.4  MCHC 33.3 33.2 32.9 33.8  RDW 16.4* 16.1* 17.0* 16.6*   ------------------------------------------------------------------------------------------------------------------  Chemistries   Recent Labs Lab 12/23/14 0955 12/23/14 1554 12/25/14 0756 12/26/14 1208  NA 136  --  137 142  K 4.9  --  4.5 4.2  CL 98*  --  104 107  CO2 23  --  20* 23  GLUCOSE 170*  --  221* 167*  BUN 131*  --  115* 92*  CREATININE 8.66* 8.61* 7.51* 5.84*  CALCIUM 8.1*  --  7.5* 8.0*   ------------------------------------------------------------------------------------------------------------------ estimated creatinine clearance is 14.1 mL/min (by C-G formula based on Cr of 5.84). ------------------------------------------------------------------------------------------------------------------ No results for input(s): TSH, T4TOTAL, T3FREE, THYROIDAB in the last 72 hours.  Invalid input(s): FREET3  Cardiac Enzymes  Recent Labs Lab 12/23/14 2206 12/24/14 0340 12/25/14 0756  TROPONINI 0.08* 0.08* 0.06*   ------------------------------------------------------------------------------------------------------------------ Invalid input(s):  POCBNP ---------------------------------------------------------------------------------------------------------------  RADIOLOGY: No results found.  EKG:  Orders placed or performed during the hospital encounter of 12/23/14  . ED EKG  . ED EKG  . EKG  12-Lead  . EKG 12-Lead  . EKG 12-Lead  . EKG 12-Lead  . EKG 12-Lead  . EKG 12-Lead  . EKG 12-Lead  . EKG 12-Lead    ASSESSMENT AND PLAN:  1. Syncope, likely due to hypotension,.blood pressure has  improved. Restart low-dose of Coreg, following blood pressure readings closely . 2. Hypotension continue IV fluids 3. Right lower lobe bacterial pneumonia community-acquired pneumonia 4. End-stage renal disease, nephrologist initiated  hemodialysis via left upper extremity AV fistulaToday , next tomorrow , they should nephrologist input  5. Anemia of chronic disease with hemoglobin level of 7.5 Epogen with hemodialysis as per nephrology 6. Chronic thrombocytopenia, stable 7. Elevated troponin, likely demand ischemia due to hypotension,.      Possible discharge tomorrow DRUG ALLERGIES:  Allergies  Allergen Reactions  . Sulfa Antibiotics Itching    hives  . Morphine And Related     Makes patient feel weird    CODE STATUS:     Code Status Orders        Start     Ordered   12/23/14 1535  Full code   Continuous     12/23/14 1534    Advance Directive Documentation        Most Recent Value   Type of Advance Directive  Healthcare Power of Attorney   Pre-existing out of facility DNR order (yellow form or pink MOST form)     "MOST" Form in Place?        TOTAL TIME TAKING CARE OF THIS PATIENT:35min  Dustin Flock M.D on 12/26/2014 at 2:42 PM  Between 7am to 6pm - Pager - [phone]  After 6pm go to [email or web address] - password [email or web address] [facility]  Office  [phone]  CC: Primary care physician; Marden Noble, MD

## 2014-12-26 NOTE — Progress Notes (Signed)
Patient refused bed alarm, spouse is at bedside and is aware of his decision. Explained to patient about fall risk.

## 2014-12-27 ENCOUNTER — Inpatient Hospital Stay: Payer: Medicare Other

## 2014-12-27 DIAGNOSIS — J9601 Acute respiratory failure with hypoxia: Secondary | ICD-10-CM

## 2014-12-27 DIAGNOSIS — I951 Orthostatic hypotension: Secondary | ICD-10-CM | POA: Insufficient documentation

## 2014-12-27 LAB — CBC WITH DIFFERENTIAL/PLATELET
BASOS ABS: 0 10*3/uL (ref 0–0.1)
Eosinophils Absolute: 0 10*3/uL (ref 0–0.7)
Eosinophils Relative: 1 %
HEMATOCRIT: 20.7 % — AB (ref 40.0–52.0)
HEMOGLOBIN: 6.9 g/dL — AB (ref 13.0–18.0)
Lymphs Abs: 0.4 10*3/uL — ABNORMAL LOW (ref 1.0–3.6)
MCH: 28.1 pg (ref 26.0–34.0)
MCHC: 33.2 g/dL (ref 32.0–36.0)
MCV: 84.8 fL (ref 80.0–100.0)
Monocytes Absolute: 0.5 10*3/uL (ref 0.2–1.0)
Monocytes Relative: 10 %
NEUTROS ABS: 3.8 10*3/uL (ref 1.4–6.5)
Platelets: 89 10*3/uL — ABNORMAL LOW (ref 150–440)
RBC: 2.44 MIL/uL — AB (ref 4.40–5.90)
RDW: 16.3 % — AB (ref 11.5–14.5)
WBC: 4.7 10*3/uL (ref 3.8–10.6)

## 2014-12-27 LAB — ABO/RH: ABO/RH(D): A POS

## 2014-12-27 LAB — CORTISOL: CORTISOL PLASMA: 11.9 ug/dL

## 2014-12-27 LAB — PREPARE RBC (CROSSMATCH)

## 2014-12-27 LAB — OCCULT BLOOD X 1 CARD TO LAB, STOOL: FECAL OCCULT BLD: NEGATIVE

## 2014-12-27 LAB — BASIC METABOLIC PANEL
ANION GAP: 10 (ref 5–15)
BUN: 66 mg/dL — ABNORMAL HIGH (ref 6–20)
CALCIUM: 7.9 mg/dL — AB (ref 8.9–10.3)
CHLORIDE: 105 mmol/L (ref 101–111)
CO2: 25 mmol/L (ref 22–32)
Creatinine, Ser: 5.28 mg/dL — ABNORMAL HIGH (ref 0.61–1.24)
GFR calc non Af Amer: 10 mL/min — ABNORMAL LOW (ref >60)
GFR, EST AFRICAN AMERICAN: 12 mL/min — AB (ref >60)
Glucose, Bld: 154 mg/dL — ABNORMAL HIGH (ref 65–99)
POTASSIUM: 4.2 mmol/L (ref 3.5–5.1)
Sodium: 140 mmol/L (ref 135–145)

## 2014-12-27 LAB — HEPATITIS C ANTIBODY

## 2014-12-27 LAB — GLUCOSE, CAPILLARY
GLUCOSE-CAPILLARY: 134 mg/dL — AB (ref 65–99)
GLUCOSE-CAPILLARY: 150 mg/dL — AB (ref 65–99)
GLUCOSE-CAPILLARY: 138 mg/dL — AB (ref 65–99)
Glucose-Capillary: 146 mg/dL — ABNORMAL HIGH (ref 65–99)

## 2014-12-27 LAB — HEPATITIS B SURFACE ANTIGEN: Hepatitis B Surface Ag: NEGATIVE

## 2014-12-27 LAB — HEPATITIS B SURFACE ANTIBODY, QUANTITATIVE: Hepatitis B-Post: <3.1 m[IU]/mL — ABNORMAL LOW (ref >9.9)

## 2014-12-27 LAB — PARATHYROID HORMONE, INTACT (NO CA): PTH: 148 pg/mL — AB (ref 15–65)

## 2014-12-27 MED ORDER — PIPERACILLIN-TAZOBACTAM 3.375 G IVPB
3.3750 g | Freq: Three times a day (TID) | INTRAVENOUS | Status: DC
  Filled 2014-12-27 (×2): qty 50

## 2014-12-27 MED ORDER — INSULIN ASPART 100 UNIT/ML ~~LOC~~ SOLN: 2.0000 [IU] | SUBCUTANEOUS | Status: DC

## 2014-12-27 MED ORDER — SODIUM CHLORIDE 0.9 % IV SOLN
Freq: Once | INTRAVENOUS | Status: AC
  Administered 2014-12-27: 19:00:00 via INTRAVENOUS

## 2014-12-27 MED ORDER — SODIUM CHLORIDE 0.9 % IV BOLUS (SEPSIS)
500.0000 mL | Freq: Once | INTRAVENOUS | Status: AC
  Administered 2014-12-27: 500 mL via INTRAVENOUS

## 2014-12-27 MED ORDER — SODIUM CHLORIDE 0.9 % IV SOLN
INTRAVENOUS | Status: DC
  Administered 2014-12-27: 12:00:00 via INTRAVENOUS

## 2014-12-27 MED ORDER — NOREPINEPHRINE BITARTRATE 1 MG/ML IV SOLN: 0.0000 ug/min | INTRAVENOUS | Status: DC

## 2014-12-27 MED ORDER — DEXTROSE 5 % IV SOLN
0.0000 ug/min | INTRAVENOUS | Status: DC
  Filled 2014-12-27: qty 4

## 2014-12-27 MED ORDER — VANCOMYCIN HCL IN DEXTROSE 1-5 GM/200ML-% IV SOLN
1000.0000 mg | Freq: Once | INTRAVENOUS | Status: AC
  Administered 2014-12-27: 1000 mg via INTRAVENOUS
  Filled 2014-12-27: qty 200

## 2014-12-27 MED ORDER — VANCOMYCIN HCL IN DEXTROSE 1-5 GM/200ML-% IV SOLN
1000.0000 mg | INTRAVENOUS | Status: AC
  Administered 2014-12-27: 1000 mg via INTRAVENOUS
  Filled 2014-12-27: qty 200

## 2014-12-27 MED ORDER — TIOTROPIUM BROMIDE MONOHYDRATE 18 MCG IN CAPS
18.0000 ug | ORAL_CAPSULE | Freq: Every day | RESPIRATORY_TRACT | Status: DC
  Filled 2014-12-27: qty 5

## 2014-12-27 MED ORDER — NOREPINEPHRINE 4 MG/250ML-% IV SOLN
0.0000 ug/min | INTRAVENOUS | Status: DC
  Administered 2014-12-27: 2 ug/min via INTRAVENOUS
  Filled 2014-12-27: qty 250

## 2014-12-27 MED ORDER — BUDESONIDE-FORMOTEROL FUMARATE 160-4.5 MCG/ACT IN AERO
2.0000 | INHALATION_SPRAY | Freq: Two times a day (BID) | RESPIRATORY_TRACT | Status: DC
  Administered 2014-12-27 – 2014-12-28 (×2): 2 via RESPIRATORY_TRACT
  Filled 2014-12-27: qty 6

## 2014-12-27 MED ORDER — PIPERACILLIN-TAZOBACTAM 3.375 G IVPB
3.3750 g | Freq: Two times a day (BID) | INTRAVENOUS | Status: DC
  Administered 2014-12-27 – 2014-12-28 (×3): 3.375 g via INTRAVENOUS
  Filled 2014-12-27 (×5): qty 50

## 2014-12-27 MED ORDER — DIPHENHYDRAMINE HCL 25 MG PO CAPS
25.0000 mg | ORAL_CAPSULE | Freq: Every evening | ORAL | Status: DC | PRN
  Administered 2014-12-27: 25 mg via ORAL
  Filled 2014-12-27: qty 1

## 2014-12-27 NOTE — Consult Note (Signed)
[facility] Consultation     ASSESSMENT/PLAN   69 year old male with chronic systolic congestive heart failure, pulmonary hypertension, emphysema. Now admitted with syncope, course complicated by pneumonia.  PULMONARY  A: -Acute hypoxic respiratory failure. -Pneumonia. -Emphysema, suspect that this is advanced. -Volume overload, likely due to congestive heart failure. -Pulmonary retention which likely severe, suspect that this is related to congestive heart failure as well as emphysema. P:   -Continue broad-spectrum antibiotics. Currently on vancomycin and Zosyn. -Continue Symbicort, will add Spiriva. -Outpatient PFT, potentially a sleep study.  CARDIOVASCULAR  A: Chronic systolic congestive heart failure Patient was transferred to the ICU due to hypotension, potentially from sepsis. This appears to have improved. P:  This appears stable this time, continue management per cardiology. We'll continue to monitor, and add pressors if needed for hypotension.  RENAL A:  Recent onset end-stage renal disease. -Volume overload secondary to above. P:   Currently on hemodialysis per the nephrology service.  GASTROINTESTINAL   HEMATOLOGIC -Thrombocytopenia, possibly secondary to hepatosplenomegaly.? Karlene Lineman  INFECTIOUS A:  Pneumonia with sepsis P:  Continue treatment with broad-spectrum antibiotics.   ENDOCRINE Will monitor blood sugars.  NEUROLOGIC Stable     ---------------------------------------  ---------------------------------------   Name: Joseph [name] MRN: [id number] DOB: 1945/09/03    ADMISSION DATE:  12/23/2014 CONSULTATION DATE:  12/27/2014  REFERRING MD :  Dr. Posey Pronto  CHIEF COMPLAINT:  Dyspnea   HISTORY OF PRESENT ILLNESS:    The patient presented to the hospital on 12/23/2014 due to multiple episodes of syncope as detailed below. He was noted to have a history of thrombocytopenia, diabetes mellitus, progressive chronic  kidney disease which has progressed to end-stage renal disease, chronic systolic heart failure.  Per H&P:  Joseph [name] is a 69 y.o. male with a known history of chronic systolic congestive heart failure, chronic kidney disease, insulin requiring diabetes medicines, coronary artery disease and myocardial infarction and chronic thrombocytopenia with baseline platelet count at around 60,000 is presenting to the ED with a chief complaint of shortness of breath and productive cough for approximately 1 week. Patient actually was hypotensive and passed out and brought into the ED by EMS. This morning as he was leaving the house to go to his doctor's office, he had a syncopal event in the parking lot. He then got to his doctor's office where he had 2 more syncopal episodes. Blood pressure on arrival by EMS was 80/60. This morning as he was leaving the house to go to his doctor's office, he had a syncopal event in the parking lot. He then got to his doctor's office where he had 2 more syncopal episodes. Blood pressure on arrival by EMS was 80/60. Patient was given fluid bolus in the ED. Chest x-ray revealed pneumonia regarding which he was started on IV levofloxacin. Denies any chest pain during my examination  Review of the CT of the chest from 12/27/2014: This appears to show bilateral areas of infiltrate consistent with pneumonia, edition there is findings consistent with severe pulmonary hypertension with enlarged pulmonary trunk, there is 4 chamber dilated dictation of the heart, there is also significant emphysematous changes seen in the lungs. These findings were present on previous chest x-rays.  Echocardiogram from 12/24/2014: Diffuse hypokinesis with an ejection fraction of 30-35%. No tricuspid regurgitation was reported on the echocardiogram, though pulmonary arterial, and right ventricular hypertension are highly likely.   PAST MEDICAL HISTORY :  Past Medical History  Diagnosis Date  .  Hypertension   .  Hypercholesterolemia   . Prostate cancer ([place])   . Hepatosplenomegaly 2015    fibrosis, no cirrhosis on 06/2013 liver biopsy  . Thrombocytopenia ([place]) 2015  . Myocardial infarction ([place]) 2001  . Depression with anxiety   . IDDM (insulin dependent diabetes mellitus) ([place])     INSULIN DEPENDENT  . GERD (gastroesophageal reflux disease)   . History of shingles   . Ischemic cardiomyopathy     a. echo 08/06/2014: EF 25-30%, multiple WMA, mod DD, PASP 49 mm Hg   . CKD (chronic kidney disease), stage IV ([facility])     a. previously on dialysis; b. followed by Dr. Abigail Butts   . Coronary artery disease, occlusive     a. s/p 4v CABG 2001(LIMA-LAD, SVG-D2, SVG-OM1, SVG-right PDA, SVG-right PL1); b. cath 07/30/2014 vein grafts all down, patent LIMA to LAD, significant LM and ost LCx dx; c. cath 08/07/2014 Synergy DES 2.5 mm x 18 mm to dist LM and ost LCx, rec lifelong DAPT  . Coronary artery disease involving coronary bypass graft 08/2014    all vein grafts occluded. Patent LIMA-LAD -- s/p PCI ot LM-Cx;   [name] Chronic combined systolic and diastolic CHF, NYHA class 2 ([place])     a. echo 08/2013: EF 40-45%, impaired relaxation, mild MR, LA mildly dilated,    . Gallstone pancreatitis 2015   Past Surgical History  Procedure Laterality Date  . Cardiac surgery    . Shoulder arthroscopy    . Penile prosthesis implant    . Myocardial infarction    . Cardiac catheterization    . Coronary angioplasty    . Coronary artery bypass graft  2001  . Colonoscopy    . Liver biopsy    . Av fistula placement Left 2015  . Cardiac catheterization N/A 07/30/2014    Procedure: Left Heart Cath and Cors/Grafts Angiography;  Surgeon: Dionisio David, MD;  Location: [facility];  Service: Cardiovascular;  Laterality: N/A;  . Cardiac catheterization N/A 08/07/2014    Procedure: Coronary Stent Intervention;  Surgeon: Lorretta Harp, MD;  Location: [facility];  Service: Cardiovascular;  Laterality: N/A;    Prior to Admission medications   Medication Sig Start Date End Date Taking? Authorizing Provider  ALPRAZolam Duanne Moron) 0.5 MG tablet Take 0.5 mg by mouth as needed for anxiety.   Yes Historical Provider, MD  aspirin EC 81 MG tablet Take 81 mg by mouth daily.   Yes Historical Provider, MD  calcitRIOL (ROCALTROL) 0.25 MCG capsule Take 0.25 mcg by mouth daily.    Yes Historical Provider, MD  carvedilol (COREG) 6.25 MG tablet Take 1.5 tablets (9.375 mg total) by mouth 2 (two) times daily. 12/09/14  Yes Brett Canales, PA-C  ferrous sulfate 325 (65 FE) MG EC tablet Take 325 mg by mouth daily with breakfast.    Yes Historical Provider, MD  furosemide (LASIX) 40 MG tablet Take 120 mg by mouth 2 (two) times daily.    Yes Historical Provider, MD  hydrALAZINE (APRESOLINE) 50 MG tablet Take 1 tablet (50 mg total) by mouth 2 (two) times daily. 11/08/14  Yes Brett Canales, PA-C  insulin glargine (LANTUS) 100 UNIT/ML injection Inject 20 Units into the skin at bedtime.   Yes Historical Provider, MD  isosorbide mononitrate (IMDUR) 60 MG 24 hr tablet Take 1.5 tablets (90 mg total) by mouth daily. 12/09/14  Yes Brett Canales, PA-C  nitroGLYCERIN (NITROLINGUAL) 0.4 MG/SPRAY spray Place 1 spray under the tongue every 5 (five) minutes x  3 doses as needed for chest pain. Do not use together with sublingual nitro 08/08/14  Yes Almyra Deforest, PA  pregabalin (LYRICA) 75 MG capsule Take 75 mg by mouth 2 (two) times daily.   Yes Historical Provider, MD  ranolazine (RANEXA) 1000 MG SR tablet Take 1 tablet (1,000 mg total) by mouth 2 (two) times daily. 09/11/14  Yes Ryan M Dunn, PA-C  rosuvastatin (CRESTOR) 20 MG tablet Take 20 mg by mouth at bedtime.    Yes Historical Provider, MD  sodium bicarbonate 650 MG tablet Take 2 tablets (1,300 mg total) by mouth 2 (two) times daily. 11/08/14  Yes Brett Canales, PA-C  ticagrelor (BRILINTA) 90 MG TABS tablet Take 1 tablet (90 mg total) by mouth 2 (two) times daily. 12/11/14  Yes Brett Canales, PA-C    Allergies  Allergen Reactions  . Sulfa Antibiotics Itching    hives  . Morphine And Related     Makes patient feel weird    FAMILY HISTORY:  Family History  Problem Relation Age of Onset  . Acute myelogenous leukemia Brother   . CAD Brother   . Diabetes Mellitus II Brother   . Heart disease Father 66    CABG   SOCIAL HISTORY:  reports that he quit smoking about 6 years ago. He has quit using smokeless tobacco. He reports that he does not drink alcohol or use illicit drugs.  REVIEW OF SYSTEMS:   Patient is currently somewhat lethargic and cannot provide a review of systems.   VITAL SIGNS: Temp:  [97.2 F (36.2 C)-99.9 F (37.7 C)] 98.1 F (36.7 C) (11/20 1104) Pulse Rate:  [65-98] 75 (11/20 1400) Resp:  [19-26] 25 (11/20 1400) BP: (65-132)/(25-68) 115/53 mmHg (11/20 1400) SpO2:  [87 %-100 %] 100 % (11/20 1400) Weight:  [87.952 kg (193 lb 14.4 oz)-92.534 kg (204 lb)] 87.952 kg (193 lb 14.4 oz) (11/20 0534) HEMODYNAMICS:   VENTILATOR SETTINGS:   INTAKE / OUTPUT:  Intake/Output Summary (Last 24 hours) at 12/27/14 1449 Last data filed at 12/27/14 0830  Gross per 24 hour  Intake      0 ml  Output      0 ml  Net      0 ml    Physical Examination:   VS: BP 115/53 mmHg  Pulse 75  Temp(Src) 98.1 F (36.7 C) (Oral)  Resp 25  Ht 5\' 10"  (1.778 m)  Wt 87.952 kg (193 lb 14.4 oz)  BMI 27.82 kg/m2  SpO2 100%  General Appearance: No distress  Neuro:without focal findings, mental status,  HEENT: PERRLA, EOM intact, no ptosis, no other lesions noticed;  Pulmonary: normal breath sounds., diaphragmatic excursion normal.No wheezing,    CardiovascularNormal S1,S2.  No m/r/g.    Abdomen: Benign, Soft, non-tender, No masses. Renal:  No costovertebral tenderness  GU:  Not performed at this time. Endoc: No evident thyromegaly, no signs of acromegaly. Skin:   warm, no rashes, no ecchymosis  Extremities: normal, no cyanosis, clubbing, no edema, warm with normal capillary  refill.    LABS: Reviewed   LABORATORY PANEL:   CBC  Recent Labs Lab 12/27/14 1135  WBC 4.7  HGB 6.9*  HCT 20.7*  PLT 89*    Chemistries   Recent Labs Lab 12/26/14 1319 12/27/14 1135  NA  --  140  K  --  4.2  CL  --  105  CO2  --  25  GLUCOSE  --  154*  BUN  --  66*  CREATININE  --  5.28*  CALCIUM  --  7.9*  PHOS 3.1  --      Recent Labs Lab 12/25/14 2123 12/26/14 0729 12/26/14 1121 12/26/14 2105 12/27/14 0721 12/27/14 1125  GLUCAP 195* 106* 162* 117* 134* 150*   No results for input(s): PHART, PCO2ART, PO2ART in the last 168 hours.  Recent Labs Lab 12/26/14 1208  ALBUMIN 2.9*    Cardiac Enzymes  Recent Labs Lab 12/25/14 0756  TROPONINI 0.06*    RADIOLOGY:  Ct Chest Wo Contrast  12/27/2014  CLINICAL DATA:  Shortness of breath. Prostate cancer. CABG. Former smoker. EXAM: CT CHEST WITHOUT CONTRAST TECHNIQUE: Multidetector CT imaging of the chest was performed following the standard protocol without IV contrast. COMPARISON:  03/04/2013 PET-CT.  12/23/2014 chest radiograph. FINDINGS: Images are motion degraded. Mediastinum/Nodes: Stable mild cardiomegaly. No pericardial fluid/thickening. Left main, left anterior descending, left circumflex and right coronary atherosclerosis, with ascending aortic and left internal mammary bypass grafts. Normal caliber thoracic aorta. Stable dilation of the main pulmonary artery (3.4 cm diameter). Normal visualized thyroid. Normal esophagus. No axillary adenopathy. Mildly enlarged 1.1 cm right paratracheal node (series 2/ image 13), previously 0.9 cm . Mildly enlarged 1.1 cm subcarinal node (2/26), increased from 0.9 cm. No gross hilar adenopathy on this noncontrast study. Lungs/Pleura: No pneumothorax. No pleural effusion. There is mild centrilobular emphysema. There are extensive patchy tree-in-bud opacities, patchy centrilobular nodular opacities and patchy peribronchovascular ground-glass opacity throughout both  lungs, most prominent in the right upper, right middle and right lower lobes. Upper abdomen: Unremarkable. Musculoskeletal: No aggressive appearing focal osseous lesions. Median sternotomy wires are intact. Marked degenerative changes in the thoracic spine. IMPRESSION: 1. Extensive patchy tree-in-bud opacities, patchy centrilobular nodular opacities and patchy peribronchovascular ground-glass opacities throughout both lungs, asymmetrically involving the lobes of the right lung. These findings are most in keeping with an infectious bronchiolitis/developing bronchopneumonia, with the differential including bacterial, fungal or tuberculosis infections. 2. New mild mediastinal lymphadenopathy, nonspecific, likely reactive. 3. Mild centrilobular emphysema. 4. Left main and 3 vessel coronary atherosclerosis status post CABG. 5. Stable dilated main pulmonary artery, suggesting chronic pulmonary arterial hypertension. Electronically Signed   By: Ilona Sorrel M.D.   On: 12/27/2014 10:44       --Marda Stalker, MD.  Board Certified in Internal Medicine, Pulmonary Medicine, [place], and Sleep Medicine.  [name]   Patricia Pesa, M.D.  Vilinda Boehringer, M.D.  Merton Border, M.D   12/27/2014, 2:49 PM

## 2014-12-27 NOTE — Progress Notes (Signed)
Orders to transfer to CCU/ transported in bed/ bedside report given to Dan,RN.

## 2014-12-27 NOTE — Progress Notes (Signed)
[facility] [place] NAME: Joseph [name]    MR#:  [id number]  DATE OF BIRTH:  1945-06-08  SUBJECTIVE:  CHIEF COMPLAINT:   Chief Complaint  Patient presents with  . Loss of Consciousness  . Hypotension    Patient continues to complain of shortness of breath but not on any oxygen   Review of Systems  Constitutional: Negative for fever, chills and weight loss.  HENT: Negative for congestion.   Eyes: Negative for blurred vision and double vision.  Respiratory: Positive for cough and shortness of breath. Negative for sputum production and wheezing.   Cardiovascular: Negative for chest pain, palpitations, orthopnea, leg swelling and PND.  Gastrointestinal: Negative for nausea, vomiting, abdominal pain, diarrhea, constipation and blood in stool.  Genitourinary: Negative for dysuria, urgency, frequency and hematuria.  Musculoskeletal: Negative for falls.  Neurological: Negative for dizziness, tremors, focal weakness and headaches.  Endo/Heme/Allergies: Does not bruise/bleed easily.  Psychiatric/Behavioral: Negative for depression. The patient does not have insomnia.     VITAL SIGNS: Blood pressure 65/25, pulse 75, temperature 98.1 F (36.7 C), temperature source Oral, resp. rate 26, height 5\' 10"  (1.778 m), weight 87.952 kg (193 lb 14.4 oz), SpO2 92 %.  PHYSICAL EXAMINATION:   GENERAL:  69 y.o.-year-old patient lying in the bed with no acute distress.  EYES: Pupils equal, round, reactive to light and accommodation. No scleral icterus. Extraocular muscles intact.  HEENT: Head atraumatic, normocephalic. Oropharynx and nasopharynx clear.  NECK:  Supple, no jugular venous distention. No thyroid enlargement, no tenderness.  LUNGS: Normal breath sounds bilaterally, no wheezing, rales,rhonchi , few right-sided crepitations posteriorly. No use of accessory muscles of respiration.  CARDIOVASCULAR: S1, S2 normal. No murmurs, rubs, or gallops.   ABDOMEN: Soft, nontender, nondistended. Bowel sounds present. No organomegaly or mass.  EXTREMITIES: No pedal edema, cyanosis, or clubbing. Left upper extremity AV fistula with good bruit and thrill NEUROLOGIC: Cranial nerves II through XII are intact. Muscle strength 5/5 in all extremities. Sensation intact. Gait not checked.  PSYCHIATRIC: The patient is alert and oriented x 3.  SKIN: No obvious rash, lesion, or ulcer.   ORDERS/RESULTS REVIEWED:   CBC  Recent Labs Lab 12/23/14 0955 12/23/14 1554 12/25/14 0439 12/26/14 1208  WBC 6.0 4.5 4.6 4.7  HGB 8.1* 7.5* 7.5* 7.3*  HCT 24.4* 22.5* 23.0* 21.6*  PLT 90* 77* 70* 73*  MCV 84.4 85.0 84.9 84.2  MCH 28.1 28.2 27.9 28.4  MCHC 33.3 33.2 32.9 33.8  RDW 16.4* 16.1* 17.0* 16.6*   ------------------------------------------------------------------------------------------------------------------  Chemistries   Recent Labs Lab 12/23/14 0955 12/23/14 1554 12/25/14 0756 12/26/14 1208  NA 136  --  137 142  K 4.9  --  4.5 4.2  CL 98*  --  104 107  CO2 23  --  20* 23  GLUCOSE 170*  --  221* 167*  BUN 131*  --  115* 92*  CREATININE 8.66* 8.61* 7.51* 5.84*  CALCIUM 8.1*  --  7.5* 8.0*   ------------------------------------------------------------------------------------------------------------------ estimated creatinine clearance is 13.9 mL/min (by C-G formula based on Cr of 5.84). ------------------------------------------------------------------------------------------------------------------ No results for input(s): TSH, T4TOTAL, T3FREE, THYROIDAB in the last 72 hours.  Invalid input(s): FREET3  Cardiac Enzymes  Recent Labs Lab 12/23/14 2206 12/24/14 0340 12/25/14 0756  TROPONINI 0.08* 0.08* 0.06*   ------------------------------------------------------------------------------------------------------------------ Invalid input(s):  POCBNP ---------------------------------------------------------------------------------------------------------------  RADIOLOGY: Ct Chest Wo Contrast  12/27/2014  CLINICAL DATA:  Shortness of breath. Prostate cancer. CABG. Former smoker. EXAM: CT CHEST WITHOUT  CONTRAST TECHNIQUE: Multidetector CT imaging of the chest was performed following the standard protocol without IV contrast. COMPARISON:  03/04/2013 PET-CT.  12/23/2014 chest radiograph. FINDINGS: Images are motion degraded. Mediastinum/Nodes: Stable mild cardiomegaly. No pericardial fluid/thickening. Left main, left anterior descending, left circumflex and right coronary atherosclerosis, with ascending aortic and left internal mammary bypass grafts. Normal caliber thoracic aorta. Stable dilation of the main pulmonary artery (3.4 cm diameter). Normal visualized thyroid. Normal esophagus. No axillary adenopathy. Mildly enlarged 1.1 cm right paratracheal node (series 2/ image 13), previously 0.9 cm . Mildly enlarged 1.1 cm subcarinal node (2/26), increased from 0.9 cm. No gross hilar adenopathy on this noncontrast study. Lungs/Pleura: No pneumothorax. No pleural effusion. There is mild centrilobular emphysema. There are extensive patchy tree-in-bud opacities, patchy centrilobular nodular opacities and patchy peribronchovascular ground-glass opacity throughout both lungs, most prominent in the right upper, right middle and right lower lobes. Upper abdomen: Unremarkable. Musculoskeletal: No aggressive appearing focal osseous lesions. Median sternotomy wires are intact. Marked degenerative changes in the thoracic spine. IMPRESSION: 1. Extensive patchy tree-in-bud opacities, patchy centrilobular nodular opacities and patchy peribronchovascular ground-glass opacities throughout both lungs, asymmetrically involving the lobes of the right lung. These findings are most in keeping with an infectious bronchiolitis/developing bronchopneumonia, with the differential  including bacterial, fungal or tuberculosis infections. 2. New mild mediastinal lymphadenopathy, nonspecific, likely reactive. 3. Mild centrilobular emphysema. 4. Left main and 3 vessel coronary atherosclerosis status post CABG. 5. Stable dilated main pulmonary artery, suggesting chronic pulmonary arterial hypertension. Electronically Signed   By: Ilona Sorrel M.D.   On: 12/27/2014 10:44    EKG:  Orders placed or performed during the hospital encounter of 12/23/14  . ED EKG  . ED EKG  . EKG 12-Lead  . EKG 12-Lead  . EKG 12-Lead  . EKG 12-Lead  . EKG 12-Lead  . EKG 12-Lead  . EKG 12-Lead  . EKG 12-Lead    ASSESSMENT AND PLAN:  1. Syncope, likely due to hypotension,.blood pressure has  improved.  2. Hypotension  improved off fluids 3. Right lower lobe bacterial pneumonia community-acquired pneumonia continues to be symptomatic at this time I'll get a CT scan of the chest without contrast 4. End-stage renal disease, nephrologist initiated  hemodialysis via left upper extremity AV fistula,  further hemodialysis per nephrology 5. Anemia of chronic disease with hemoglobin level of 7.5 Epogen with hemodialysis as per nephrology 6. Chronic thrombocytopenia, stable 7. Elevated troponin, likely demand ischemia due to hypotension,. Patient with recent MI     DRUG ALLERGIES:  Allergies  Allergen Reactions  . Sulfa Antibiotics Itching    hives  . Morphine And Related     Makes patient feel weird    CODE STATUS:     Code Status Orders        Start     Ordered   12/23/14 1535  Full code   Continuous     12/23/14 1534    Advance Directive Documentation        Most Recent Value   Type of Advance Directive  Healthcare Power of Attorney   Pre-existing out of facility DNR order (yellow form or pink MOST form)     "MOST" Form in Place?        TOTAL TIME TAKING CARE OF THIS PATIENT:3min  Dustin Flock M.D on 12/27/2014 at 12:31 PM  Between 7am to 6pm - Pager -  [phone]  After 6pm go to [email or web address] - password [facility]  Office  [phone]  CC: Primary care physician; Marden Noble, MD

## 2014-12-27 NOTE — Progress Notes (Signed)
MEDICATION RELATED CONSULT NOTE -FOLLOW UP  Pharmacy Consult for renal dose adjustment of antibiotics Indication: Zosyn  Allergies  Allergen Reactions  . Sulfa Antibiotics Itching    hives  . Morphine And Related     Makes patient feel weird    Patient Measurements: Height: 5\' 10"  (177.8 cm) Weight: 193 lb 14.4 oz (87.952 kg) IBW/kg (Calculated) : 73   Vital Signs: Temp: 98.1 F (36.7 C) (11/20 1104) Temp Source: Oral (11/20 1104) BP: 115/53 mmHg (11/20 1400) Pulse Rate: 75 (11/20 1400) Intake/Output from previous day: 11/19 0701 - 11/20 0700 In: 480 [P.O.:480] Out: 1 [Stool:1] Intake/Output from this shift:    Labs:  Recent Labs  12/25/14 0439 12/25/14 0756 12/26/14 1208 12/26/14 1319 12/27/14 1135  WBC 4.6  --  4.7  --  4.7  HGB 7.5*  --  7.3*  --  6.9*  HCT 23.0*  --  21.6*  --  20.7*  PLT 70*  --  73*  --  89*  CREATININE  --  7.51* 5.84*  --  5.28*  PHOS  --   --  5.1* 3.1  --   ALBUMIN  --   --  2.9*  --   --    Estimated Creatinine Clearance: 15.4 mL/min (by C-G formula based on Cr of 5.28).   Microbiology: Recent Results (from the past 720 hour(s))  Culture, blood (routine x 2) Call MD if unable to obtain prior to antibiotics being given     Status: None (Preliminary result)   Collection Time: 12/23/14  3:54 PM  Result Value Ref Range Status   Specimen Description BLOOD RIGHT ASSIST CONTROL  Final   Special Requests   Final    BOTTLES DRAWN AEROBIC AND ANAEROBIC [place] AERO 5CC ANA   Culture NO GROWTH 4 DAYS  Final   Report Status PENDING  Incomplete  Culture, blood (routine x 2) Call MD if unable to obtain prior to antibiotics being given     Status: None (Preliminary result)   Collection Time: 12/23/14  5:24 PM  Result Value Ref Range Status   Specimen Description BLOOD RIGHT HAND  Final   Special Requests BOTTLES DRAWN AEROBIC AND ANAEROBIC 4CC  Final   Culture NO GROWTH 4 DAYS  Final   Report Status PENDING  Incomplete  Culture,  sputum-assessment     Status: None   Collection Time: 12/24/14 12:38 PM  Result Value Ref Range Status   Specimen Description EXPECTORATED SPUTUM  Final   Special Requests NONE  Final   Sputum evaluation THIS SPECIMEN IS ACCEPTABLE FOR SPUTUM CULTURE  Final   Report Status 12/24/2014 FINAL  Final  Culture, respiratory (NON-Expectorated)     Status: None (Preliminary result)   Collection Time: 12/24/14 12:38 PM  Result Value Ref Range Status   Specimen Description EXPECTORATED SPUTUM  Final   Special Requests NONE Reflexed from [id number]  Final   Gram Stain PENDING  Incomplete   Culture LIGHT GROWTH STAPHYLOCOCCUS AUREUS  Final   Report Status PENDING  Incomplete   Organism ID, Bacteria STAPHYLOCOCCUS AUREUS  Final      Susceptibility   Staphylococcus aureus - MIC*    CIPROFLOXACIN <=0.5 SENSITIVE Sensitive     GENTAMICIN <=0.5 SENSITIVE Sensitive     OXACILLIN 0.5 SENSITIVE Sensitive     TRIMETH/SULFA <=10 SENSITIVE Sensitive     CEFOXITIN SCREEN NEGATIVE Sensitive     Inducible Clindamycin NEGATIVE Sensitive     ERYTHROMYCIN Value in next row Sensitive  SENSITIVE0.5    TETRACYCLINE Value in next row Sensitive      SENSITIVE<=1    CLINDAMYCIN Value in next row Sensitive      SENSITIVE<=0.25    LEVOFLOXACIN Value in next row Sensitive      SENSITIVE0.25    LINEZOLID Value in next row Sensitive      SENSITIVE2    * LIGHT GROWTH STAPHYLOCOCCUS AUREUS    Medical History: Past Medical History  Diagnosis Date  . Hypertension   . Hypercholesterolemia   . Prostate cancer ([place])   . Hepatosplenomegaly 2015    fibrosis, no cirrhosis on 06/2013 liver biopsy  . Thrombocytopenia ([place]) 2015  . Myocardial infarction ([place]) 2001  . Depression with anxiety   . IDDM (insulin dependent diabetes mellitus) ([place])     INSULIN DEPENDENT  . GERD (gastroesophageal reflux disease)   . History of shingles   . Ischemic cardiomyopathy     a. echo 08/06/2014: EF 25-30%, multiple WMA, mod DD, PASP  49 mm Hg   . CKD (chronic kidney disease), stage IV ([facility])     a. previously on dialysis; b. followed by Dr. Abigail Butts   . Coronary artery disease, occlusive     a. s/p 4v CABG 2001(LIMA-LAD, SVG-D2, SVG-OM1, SVG-right PDA, SVG-right PL1); b. cath 07/30/2014 vein grafts all down, patent LIMA to LAD, significant LM and ost LCx dx; c. cath 08/07/2014 Synergy DES 2.5 mm x 18 mm to dist LM and ost LCx, rec lifelong DAPT  . Coronary artery disease involving coronary bypass graft 08/2014    all vein grafts occluded. Patent LIMA-LAD -- s/p PCI ot LM-Cx;   [name] Chronic combined systolic and diastolic CHF, NYHA class 2 ([place])     a. echo 08/2013: EF 40-45%, impaired relaxation, mild MR, LA mildly dilated,    . Gallstone pancreatitis 2015    Assessment: 69 yo patient with PMH of anemia, CKD, and has been receiving HD. Patient is being treated for CAP and hypotension, and required transfer to CCU on 11/20 for SOB and low BP. Patient was started on Levaquin 11/16, D'cd today, and patient started on Vancomycin and Zosyn.  Plan:  Will transition to Zosyn 3.375gm IV q12h for HD/renal dosing.    Chinita Greenland PharmD Clinical Pharmacist 12/27/2014 2:35 PM

## 2014-12-27 NOTE — Progress Notes (Signed)
ANTIBIOTIC CONSULT NOTE - INITIAL  Pharmacy Consult for Vancomycin  Indication: pneumonia  Allergies  Allergen Reactions  . Sulfa Antibiotics Itching    hives  . Morphine And Related     Makes patient feel weird    Patient Measurements: Height: 5\' 10"  (177.8 cm) Weight: 193 lb 14.4 oz (87.952 kg) IBW/kg (Calculated) : 73  Vital Signs: Temp: 98.1 F (36.7 C) (11/20 1104) Temp Source: Oral (11/20 1104) BP: 93/52 mmHg (11/20 1300) Pulse Rate: 65 (11/20 1300) Intake/Output from previous day: 11/19 0701 - 11/20 0700 In: 480 [P.O.:480] Out: 1 [Stool:1]   Recent Labs  12/25/14 0439 12/25/14 0756 12/26/14 1208 12/27/14 1135  WBC 4.6  --  4.7 4.7  HGB 7.5*  --  7.3* 6.9*  PLT 70*  --  73* 89*  CREATININE  --  7.51* 5.84* 5.28*   Estimated Creatinine Clearance: 15.4 mL/min (by C-G formula based on Cr of 5.28). No results for input(s): VANCOTROUGH, VANCOPEAK, VANCORANDOM, GENTTROUGH, GENTPEAK, GENTRANDOM, TOBRATROUGH, TOBRAPEAK, TOBRARND, AMIKACINPEAK, AMIKACINTROU, AMIKACIN in the last 72 hours.   Microbiology: Recent Results (from the past 720 hour(s))  Culture, blood (routine x 2) Call MD if unable to obtain prior to antibiotics being given     Status: None (Preliminary result)   Collection Time: 12/23/14  3:54 PM  Result Value Ref Range Status   Specimen Description BLOOD RIGHT ASSIST CONTROL  Final   Special Requests   Final    BOTTLES DRAWN AEROBIC AND ANAEROBIC 8CC AERO 5CC ANA   Culture NO GROWTH 4 DAYS  Final   Report Status PENDING  Incomplete  Culture, blood (routine x 2) Call MD if unable to obtain prior to antibiotics being given     Status: None (Preliminary result)   Collection Time: 12/23/14  5:24 PM  Result Value Ref Range Status   Specimen Description BLOOD RIGHT HAND  Final   Special Requests BOTTLES DRAWN AEROBIC AND ANAEROBIC 4CC  Final   Culture NO GROWTH 4 DAYS  Final   Report Status PENDING  Incomplete  Culture, sputum-assessment     Status:  None   Collection Time: 12/24/14 12:38 PM  Result Value Ref Range Status   Specimen Description EXPECTORATED SPUTUM  Final   Special Requests NONE  Final   Sputum evaluation THIS SPECIMEN IS ACCEPTABLE FOR SPUTUM CULTURE  Final   Report Status 12/24/2014 FINAL  Final  Culture, respiratory (NON-Expectorated)     Status: None (Preliminary result)   Collection Time: 12/24/14 12:38 PM  Result Value Ref Range Status   Specimen Description EXPECTORATED SPUTUM  Final   Special Requests NONE Reflexed from [id number]  Final   Gram Stain PENDING  Incomplete   Culture   Final    LIGHT GROWTH STAPHYLOCOCCUS AUREUS SUSCEPTIBILITIES TO FOLLOW    Report Status PENDING  Incomplete    Medical History: Past Medical History  Diagnosis Date  . Hypertension   . Hypercholesterolemia   . Prostate cancer ([place])   . Hepatosplenomegaly 2015    fibrosis, no cirrhosis on 06/2013 liver biopsy  . Thrombocytopenia ([place]) 2015  . Myocardial infarction ([place]) 2001  . Depression with anxiety   . IDDM (insulin dependent diabetes mellitus) ([place])     INSULIN DEPENDENT  . GERD (gastroesophageal reflux disease)   . History of shingles   . Ischemic cardiomyopathy     a. echo 08/06/2014: EF 25-30%, multiple WMA, mod DD, PASP 49 mm Hg   . CKD (chronic kidney disease), stage IV (H[facility])  a. previously on dialysis; b. followed by Dr. Abigail Butts   . Coronary artery disease, occlusive     a. s/p 4v CABG 2001(LIMA-LAD, SVG-D2, SVG-OM1, SVG-right PDA, SVG-right PL1); b. cath 07/30/2014 vein grafts all down, patent LIMA to LAD, significant LM and ost LCx dx; c. cath 08/07/2014 Synergy DES 2.5 mm x 18 mm to dist LM and ost LCx, rec lifelong DAPT  . Coronary artery disease involving coronary bypass graft 08/2014    all vein grafts occluded. Patent LIMA-LAD -- s/p PCI ot LM-Cx;   [name] Chronic combined systolic and diastolic CHF, NYHA class 2 ([place])     a. echo 08/2013: EF 40-45%, impaired relaxation, mild MR, LA mildly dilated,    . Gallstone  pancreatitis 2015   Assessment: 69 yo patient with PMH of anemia,  CKD, and has been receiving HD. Patient is being treated for CAP and hypotension, and required transfer to CCU on 11/20 for SOB and low BP. Patient was started on Levaquin 11/16, D'cd today, and patient started on Vancomycin and Zosyn.  CT Chest: abnormal, with ground glass opacities throughout both lungs, WBC: 4.7, he is afebrile.   Goal of Therapy:  Pre-HD: 15-25 mcg/mL Post-HD: 5-15 mcg/mL  Plan:  Started patient on 2g load ( 2x 1000mg  doses back to back).  Plan to order 1g with each dialysis, but patient does not have set schedule yet. Last HD 11/19.  Need to follow up on HD orders.  Pharmacy will continue to monitor labs and make adjustments as needed.   Nancy Fetter, PharmD Pharmacy Resident

## 2014-12-27 NOTE — Progress Notes (Signed)
MD paged to make aware of low BP

## 2014-12-27 NOTE — Progress Notes (Signed)
[facility]  ROUNDING NOTE   Subjective:  Pt seen at bedside. Low blood pressure today. Had HD yesterday. Pt transferred over to CCU, but BP was higher upon presenting to CCU.    Objective:  Vital signs in last 24 hours:  Temp:  [97.2 F (36.2 C)-99.9 F (37.7 C)] 98.1 F (36.7 C) (11/20 1104) Pulse Rate:  [65-98] 65 (11/20 1300) Resp:  [19-26] 22 (11/20 1300) BP: (65-132)/(25-68) 93/52 mmHg (11/20 1300) SpO2:  [87 %-100 %] 100 % (11/20 1300) Weight:  [87.952 kg (193 lb 14.4 oz)-92.534 kg (204 lb)] 87.952 kg (193 lb 14.4 oz) (11/20 0534)  Weight change: -2.866 kg (-6 lb 5.1 oz) Filed Weights   12/25/14 1649 12/26/14 1648 12/27/14 0534  Weight: 91.128 kg (200 lb 14.4 oz) 92.534 kg (204 lb) 87.952 kg (193 lb 14.4 oz)    Intake/Output: I/O last 3 completed shifts: In: 480 [P.O.:480] Out: 526 [Urine:525; Stool:1]   Intake/Output this shift:     Physical Exam: General: NAD  Head: Normocephalic, atraumatic. Moist oral mucosal membranes  Eyes: Anicteric  Neck: Supple, trachea midline  Lungs:  Clear to auscultation normal effort  Heart: S1S2 no rubs  Abdomen:  Soft, nontender, BS present.   Extremities:  trace peripheral edema.  Neurologic: Nonfocal, moving all four extremities  Skin: No lesions  Access: LUE AVF with good bruit and thrill    Basic Metabolic Panel:  Recent Labs Lab 12/23/14 0955 12/23/14 1554 12/25/14 0756 12/26/14 1208 12/26/14 1319 12/27/14 1135  NA 136  --  137 142  --  140  K 4.9  --  4.5 4.2  --  4.2  CL 98*  --  104 107  --  105  CO2 23  --  20* 23  --  25  GLUCOSE 170*  --  221* 167*  --  154*  BUN 131*  --  115* 92*  --  66*  CREATININE 8.66* 8.61* 7.51* 5.84*  --  5.28*  CALCIUM 8.1*  --  7.5* 8.0*  --  7.9*  PHOS  --   --   --  5.1* 3.1  --     Liver Function Tests:  Recent Labs Lab 12/26/14 1208  ALBUMIN 2.9*   No results for input(s): LIPASE, AMYLASE in the last 168 hours. No results for input(s): AMMONIA  in the last 168 hours.  CBC:  Recent Labs Lab 12/23/14 0955 12/23/14 1554 12/25/14 0439 12/26/14 1208 12/27/14 1135  WBC 6.0 4.5 4.6 4.7 4.7  NEUTROABS  --   --   --   --  3.8  HGB 8.1* 7.5* 7.5* 7.3* 6.9*  HCT 24.4* 22.5* 23.0* 21.6* 20.7*  MCV 84.4 85.0 84.9 84.2 84.8  PLT 90* 77* 70* 73* 89*    Cardiac Enzymes:  Recent Labs Lab 12/23/14 0955 12/23/14 1554 12/23/14 2206 12/24/14 0340 12/25/14 0756  CKTOTAL  --   --   --  211  --   TROPONINI 0.11* 0.09* 0.08* 0.08* 0.06*    BNP: Invalid input(s): POCBNP  CBG:  Recent Labs Lab 12/26/14 0729 12/26/14 1121 12/26/14 2105 12/27/14 0721 12/27/14 1125  GLUCAP 106* 162* 117* 134* 150*    Microbiology: Results for orders placed or performed during the hospital encounter of 12/23/14  Culture, blood (routine x 2) Call MD if unable to obtain prior to antibiotics being given     Status: None (Preliminary result)   Collection Time: 12/23/14  3:54 PM  Result Value Ref Range Status  Specimen Description BLOOD RIGHT ASSIST CONTROL  Final   Special Requests   Final    BOTTLES DRAWN AEROBIC AND ANAEROBIC 8CC AERO 5CC ANA   Culture NO GROWTH 4 DAYS  Final   Report Status PENDING  Incomplete  Culture, blood (routine x 2) Call MD if unable to obtain prior to antibiotics being given     Status: None (Preliminary result)   Collection Time: 12/23/14  5:24 PM  Result Value Ref Range Status   Specimen Description BLOOD RIGHT HAND  Final   Special Requests BOTTLES DRAWN AEROBIC AND ANAEROBIC 4CC  Final   Culture NO GROWTH 4 DAYS  Final   Report Status PENDING  Incomplete  Culture, sputum-assessment     Status: None   Collection Time: 12/24/14 12:38 PM  Result Value Ref Range Status   Specimen Description EXPECTORATED SPUTUM  Final   Special Requests NONE  Final   Sputum evaluation THIS SPECIMEN IS ACCEPTABLE FOR SPUTUM CULTURE  Final   Report Status 12/24/2014 FINAL  Final  Culture, respiratory (NON-Expectorated)      Status: None (Preliminary result)   Collection Time: 12/24/14 12:38 PM  Result Value Ref Range Status   Specimen Description EXPECTORATED SPUTUM  Final   Special Requests NONE Reflexed from [id number]  Final   Gram Stain PENDING  Incomplete   Culture   Final    LIGHT GROWTH STAPHYLOCOCCUS AUREUS SUSCEPTIBILITIES TO FOLLOW    Report Status PENDING  Incomplete    Coagulation Studies: No results for input(s): LABPROT, INR in the last 72 hours.  Urinalysis: No results for input(s): COLORURINE, LABSPEC, PHURINE, GLUCOSEU, HGBUR, BILIRUBINUR, KETONESUR, PROTEINUR, UROBILINOGEN, NITRITE, LEUKOCYTESUR in the last 72 hours.  Invalid input(s): APPERANCEUR    Imaging: Ct Chest Wo Contrast  12/27/2014  CLINICAL DATA:  Shortness of breath. Prostate cancer. CABG. Former smoker. EXAM: CT CHEST WITHOUT CONTRAST TECHNIQUE: Multidetector CT imaging of the chest was performed following the standard protocol without IV contrast. COMPARISON:  03/04/2013 PET-CT.  12/23/2014 chest radiograph. FINDINGS: Images are motion degraded. Mediastinum/Nodes: Stable mild cardiomegaly. No pericardial fluid/thickening. Left main, left anterior descending, left circumflex and right coronary atherosclerosis, with ascending aortic and left internal mammary bypass grafts. Normal caliber thoracic aorta. Stable dilation of the main pulmonary artery (3.4 cm diameter). Normal visualized thyroid. Normal esophagus. No axillary adenopathy. Mildly enlarged 1.1 cm right paratracheal node (series 2/ image 13), previously 0.9 cm . Mildly enlarged 1.1 cm subcarinal node (2/26), increased from 0.9 cm. No gross hilar adenopathy on this noncontrast study. Lungs/Pleura: No pneumothorax. No pleural effusion. There is mild centrilobular emphysema. There are extensive patchy tree-in-bud opacities, patchy centrilobular nodular opacities and patchy peribronchovascular ground-glass opacity throughout both lungs, most prominent in the right upper, right  middle and right lower lobes. Upper abdomen: Unremarkable. Musculoskeletal: No aggressive appearing focal osseous lesions. Median sternotomy wires are intact. Marked degenerative changes in the thoracic spine. IMPRESSION: 1. Extensive patchy tree-in-bud opacities, patchy centrilobular nodular opacities and patchy peribronchovascular ground-glass opacities throughout both lungs, asymmetrically involving the lobes of the right lung. These findings are most in keeping with an infectious bronchiolitis/developing bronchopneumonia, with the differential including bacterial, fungal or tuberculosis infections. 2. New mild mediastinal lymphadenopathy, nonspecific, likely reactive. 3. Mild centrilobular emphysema. 4. Left main and 3 vessel coronary atherosclerosis status post CABG. 5. Stable dilated main pulmonary artery, suggesting chronic pulmonary arterial hypertension. Electronically Signed   By: Ilona Sorrel M.D.   On: 12/27/2014 10:44     Medications:   . sodium  chloride 100 mL/hr at 12/27/14 1215  . norepinephrine 2 mcg/min (12/27/14 1253)   . sodium chloride   Intravenous Once  . aspirin EC  81 mg Oral Daily  . bacitracin   Topical BID  . budesonide-formoterol  2 puff Inhalation BID  . calcitRIOL  0.25 mcg Oral Daily  . [START ON 12/29/2014] epoetin (EPOGEN/PROCRIT) injection  10,000 Units Intravenous Q T,Th,Sa-HD  . ferrous sulfate  325 mg Oral Q breakfast  . guaiFENesin  600 mg Oral BID  . insulin aspart  0-5 Units Subcutaneous QHS  . insulin aspart  0-9 Units Subcutaneous TID WC  . insulin glargine  20 Units Subcutaneous QHS  . piperacillin-tazobactam (ZOSYN)  IV  3.375 g Intravenous Q12H  . pregabalin  75 mg Oral BID  . rosuvastatin  20 mg Oral QHS  . sodium chloride  3 mL Intravenous Q12H  . ticagrelor  90 mg Oral BID  . tuberculin  5 Units Intradermal Once  . vancomycin  1,000 mg Intravenous STAT  . vancomycin  1,000 mg Intravenous Once   acetaminophen, ALPRAZolam,  ipratropium-albuterol, nitroGLYCERIN, ondansetron (ZOFRAN) IV, sodium chloride, zolpidem  Assessment/ Plan:  69 y.o. male  with coronary artery disease status post CABG, diabetes mellitus type 2, hypertension, prostate cancer, hyperlipidemia presents as a follow up patient for chronic kidney disease stage V/ESRD, anemia of CKD, SHPTH  1.  End-stage renal disease.  Over the past several months the patient has had progressive renal dysfunction.  Was previously on dialysis.  EGFR remains quite low at 7 upon presentation. -Pt has had 2 dialysis treatments, hypotensive today so will hold off on HD today.  Pt was on dialysis previously at [facility] and requests to go back there upon discharge.  This process can hopefully started next week once pt has had three HD treatments.  His LUE AVF appears to be working well at the moment.    2.  Anemia chronic kidney disease.  Hgb currently 6.9, to be transfused 1 unit PRBC.  3.  Secondary hyperparathyroidism.  Phos 3.1 at last check and acceptable.    4.  Metabolic acidosis:  Sodium bicarb tablets stopped.  5.  Hypotension:  Pt transferred to CCU for hypotension, levophed maybe started.  Pt also to receive blood which may help with pressure.     LOS: 4 Joseph [name] 11/20/20161:58 PM

## 2014-12-27 NOTE — Progress Notes (Signed)
[facility] NAME: Joseph [name]    MR#:  [id number]  DATE OF BIRTH:  1945-11-08  SUBJECTIVE:  CHIEF COMPLAINT:   Chief Complaint  Patient presents with  . Loss of Consciousness  . Hypotension    Called by nurse due to patient with blood pressure dropping into the 70s , I gave 500 cc bolus blood pressure still low in the 60s. Patient complains of being very weak and now complaining of shortness of breath   Review of Systems  Constitutional: Negative for fever, chills and weight loss. Positive weakness  HENT: Negative for congestion.   Eyes: Negative for blurred vision and double vision.  Respiratory: Positive for cough and shortness of breath. Negative for sputum production and wheezing.   Cardiovascular: Negative for chest pain, palpitations, orthopnea, leg swelling and PND.  Gastrointestinal: Negative for nausea, vomiting, abdominal pain, diarrhea, constipation and blood in stool.  Genitourinary: Negative for dysuria, urgency, frequency and hematuria.  Musculoskeletal: Negative for falls.  Neurological: Negative for dizziness, tremors, focal weakness and headaches.  Endo/Heme/Allergies: Does not bruise/bleed easily.  Psychiatric/Behavioral: Negative for depression. The patient does not have insomnia.     VITAL SIGNS: Blood pressure 65/25, pulse 75, temperature 98.1 F (36.7 C), temperature source Oral, resp. rate 26, height 5\' 10"  (1.778 m), weight 87.952 kg (193 lb 14.4 oz), SpO2 92 %.  PHYSICAL EXAMINATION:   GENERAL:  69 y.o.-year-old patient lying in the bed  Critically ill-appearing  EYES: Pupils equal, round, reactive to light and accommodation. No scleral icterus. Extraocular muscles intact.  HEENT: Head atraumatic, normocephalic. Oropharynx and nasopharynx clear.  NECK:  Supple, no jugular venous distention. No thyroid enlargement, no tenderness.  LUNGS: Normal breath sounds bilaterally, no wheezing,  rales,rhonchi , few right-sided crepitations posteriorly. No use of accessory muscles of respiration.  CARDIOVASCULAR: S1, S2 normal. No murmurs, rubs, or gallops.  ABDOMEN: Soft, nontender, nondistended. Bowel sounds present. No organomegaly or mass.  EXTREMITIES: No pedal edema, cyanosis, or clubbing. Left upper extremity AV fistula with good bruit and thrill NEUROLOGIC: Cranial nerves II through XII are intact. Muscle strength 5/5 in all extremities. Sensation intact. Gait not checked.  PSYCHIATRIC: The patient is alert and oriented x 3.  SKIN: No obvious rash, lesion, or ulcer.   ORDERS/RESULTS REVIEWED:   CBC  Recent Labs Lab 12/23/14 0955 12/23/14 1554 12/25/14 0439 12/26/14 1208  WBC 6.0 4.5 4.6 4.7  HGB 8.1* 7.5* 7.5* 7.3*  HCT 24.4* 22.5* 23.0* 21.6*  PLT 90* 77* 70* 73*  MCV 84.4 85.0 84.9 84.2  MCH 28.1 28.2 27.9 28.4  MCHC 33.3 33.2 32.9 33.8  RDW 16.4* 16.1* 17.0* 16.6*   ------------------------------------------------------------------------------------------------------------------  Chemistries   Recent Labs Lab 12/23/14 0955 12/23/14 1554 12/25/14 0756 12/26/14 1208 12/27/14 1135  NA 136  --  137 142 140  K 4.9  --  4.5 4.2 4.2  CL 98*  --  104 107 105  CO2 23  --  20* 23 25  GLUCOSE 170*  --  221* 167* 154*  BUN 131*  --  115* 92* 66*  CREATININE 8.66* 8.61* 7.51* 5.84* 5.28*  CALCIUM 8.1*  --  7.5* 8.0* 7.9*   ------------------------------------------------------------------------------------------------------------------ estimated creatinine clearance is 15.4 mL/min (by C-G formula based on Cr of 5.28). ------------------------------------------------------------------------------------------------------------------ No results for input(s): TSH, T4TOTAL, T3FREE, THYROIDAB in the last 72 hours.  Invalid input(s): FREET3  Cardiac Enzymes  Recent Labs Lab 12/23/14 2206 12/24/14 0340 12/25/14 0756  TROPONINI 0.08* 0.08* 0.06*    ------------------------------------------------------------------------------------------------------------------ Invalid input(s): POCBNP ---------------------------------------------------------------------------------------------------------------  RADIOLOGY: Ct Chest Wo Contrast  12/27/2014  CLINICAL DATA:  Shortness of breath. Prostate cancer. CABG. Former smoker. EXAM: CT CHEST WITHOUT CONTRAST TECHNIQUE: Multidetector CT imaging of the chest was performed following the standard protocol without IV contrast. COMPARISON:  03/04/2013 PET-CT.  12/23/2014 chest radiograph. FINDINGS: Images are motion degraded. Mediastinum/Nodes: Stable mild cardiomegaly. No pericardial fluid/thickening. Left main, left anterior descending, left circumflex and right coronary atherosclerosis, with ascending aortic and left internal mammary bypass grafts. Normal caliber thoracic aorta. Stable dilation of the main pulmonary artery (3.4 cm diameter). Normal visualized thyroid. Normal esophagus. No axillary adenopathy. Mildly enlarged 1.1 cm right paratracheal node (series 2/ image 13), previously 0.9 cm . Mildly enlarged 1.1 cm subcarinal node (2/26), increased from 0.9 cm. No gross hilar adenopathy on this noncontrast study. Lungs/Pleura: No pneumothorax. No pleural effusion. There is mild centrilobular emphysema. There are extensive patchy tree-in-bud opacities, patchy centrilobular nodular opacities and patchy peribronchovascular ground-glass opacity throughout both lungs, most prominent in the right upper, right middle and right lower lobes. Upper abdomen: Unremarkable. Musculoskeletal: No aggressive appearing focal osseous lesions. Median sternotomy wires are intact. Marked degenerative changes in the thoracic spine. IMPRESSION: 1. Extensive patchy tree-in-bud opacities, patchy centrilobular nodular opacities and patchy peribronchovascular ground-glass opacities throughout both lungs, asymmetrically involving the lobes  of the right lung. These findings are most in keeping with an infectious bronchiolitis/developing bronchopneumonia, with the differential including bacterial, fungal or tuberculosis infections. 2. New mild mediastinal lymphadenopathy, nonspecific, likely reactive. 3. Mild centrilobular emphysema. 4. Left main and 3 vessel coronary atherosclerosis status post CABG. 5. Stable dilated main pulmonary artery, suggesting chronic pulmonary arterial hypertension. Electronically Signed   By: Ilona Sorrel M.D.   On: 12/27/2014 10:44    EKG:  Orders placed or performed during the hospital encounter of 12/23/14  . ED EKG  . ED EKG  . EKG 12-Lead  . EKG 12-Lead  . EKG 12-Lead  . EKG 12-Lead  . EKG 12-Lead  . EKG 12-Lead  . EKG 12-Lead  . EKG 12-Lead    ASSESSMENT AND PLAN:  1. Hypotension: Possibly related to dialysis, anemia, underlying infection. At this point I will start patient on a Levophed drip. Continue normal saline infusion. I have discussed with the patient regarding anemia and transfusion he is agreeable to transfusion that should also help with his hypotension.  2. Abnormal CT scan: Possibly due to underlying infectious process and I have discussed the case with the on-call pulmonologist. He'll see the patient. At this time I will change his Levaquin to Zosyn and vancomycin.  3. 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 Sulfa Antibiotics Itching    hives  . Morphine And Related     Makes patient feel weird    CODE STATUS:     Code Status Orders        Start     Ordered   12/23/14 1535  Full code   Continuous     12/23/14 1534    Advance Directive Documentation        Most Recent Value   Type of Advance Directive  Healthcare Power  of Attorney   Pre-existing out of facility DNR order (yellow form  or pink MOST form)     "MOST" Form in Place?        TOTAL TIME TAKING CARE OF THIS PATIENT 45 minutes of critical care time spent  Dustin Flock M.D on 12/27/2014 at 12:35 PM  Between 7am to 6pm - Pager - [phone]  After 6pm go to [email or web address] - password [email or web address] [facility]  Office  [phone]  CC: Primary care physician; Marden Noble, MD

## 2014-12-28 ENCOUNTER — Encounter (HOSPITAL_COMMUNITY): Payer: Self-pay

## 2014-12-28 ENCOUNTER — Inpatient Hospital Stay (HOSPITAL_COMMUNITY)
Admission: AD | Admit: 2014-12-28 | Discharge: 2015-01-04 | DRG: 871 | Disposition: A | Discharge status: Home / Self Care | Payer: Medicare Other | Source: Other Acute Inpatient Hospital | Attending: Family Medicine | Admitting: Family Medicine

## 2014-12-28 ENCOUNTER — Telehealth: Payer: Self-pay | Admitting: Internal Medicine

## 2014-12-28 DIAGNOSIS — D472 Monoclonal gammopathy: Secondary | ICD-10-CM | POA: Diagnosis present

## 2014-12-28 DIAGNOSIS — I5022 Chronic systolic (congestive) heart failure: Secondary | ICD-10-CM

## 2014-12-28 DIAGNOSIS — Z882 Allergy status to sulfonamides status: Secondary | ICD-10-CM | POA: Diagnosis not present

## 2014-12-28 DIAGNOSIS — I2510 Atherosclerotic heart disease of native coronary artery without angina pectoris: Secondary | ICD-10-CM | POA: Diagnosis not present

## 2014-12-28 DIAGNOSIS — I25810 Atherosclerosis of coronary artery bypass graft(s) without angina pectoris: Secondary | ICD-10-CM | POA: Diagnosis present

## 2014-12-28 DIAGNOSIS — D649 Anemia, unspecified: Secondary | ICD-10-CM | POA: Diagnosis present

## 2014-12-28 DIAGNOSIS — I10 Essential (primary) hypertension: Secondary | ICD-10-CM | POA: Diagnosis present

## 2014-12-28 DIAGNOSIS — Z794 Long term (current) use of insulin: Secondary | ICD-10-CM

## 2014-12-28 DIAGNOSIS — I255 Ischemic cardiomyopathy: Secondary | ICD-10-CM | POA: Diagnosis present

## 2014-12-28 DIAGNOSIS — J9600 Acute respiratory failure, unspecified whether with hypoxia or hypercapnia: Secondary | ICD-10-CM | POA: Diagnosis present

## 2014-12-28 DIAGNOSIS — D696 Thrombocytopenia, unspecified: Secondary | ICD-10-CM | POA: Diagnosis present

## 2014-12-28 DIAGNOSIS — K766 Portal hypertension: Secondary | ICD-10-CM | POA: Diagnosis present

## 2014-12-28 DIAGNOSIS — I252 Old myocardial infarction: Secondary | ICD-10-CM | POA: Diagnosis present

## 2014-12-28 DIAGNOSIS — F418 Other specified anxiety disorders: Secondary | ICD-10-CM | POA: Diagnosis present

## 2014-12-28 DIAGNOSIS — E7800 Pure hypercholesterolemia, unspecified: Secondary | ICD-10-CM | POA: Diagnosis present

## 2014-12-28 DIAGNOSIS — E861 Hypovolemia: Secondary | ICD-10-CM | POA: Diagnosis present

## 2014-12-28 DIAGNOSIS — R6521 Severe sepsis with septic shock: Secondary | ICD-10-CM | POA: Diagnosis present

## 2014-12-28 DIAGNOSIS — Z87891 Personal history of nicotine dependence: Secondary | ICD-10-CM

## 2014-12-28 DIAGNOSIS — I951 Orthostatic hypotension: Secondary | ICD-10-CM | POA: Diagnosis present

## 2014-12-28 DIAGNOSIS — Z885 Allergy status to narcotic agent status: Secondary | ICD-10-CM

## 2014-12-28 DIAGNOSIS — I5042 Chronic combined systolic (congestive) and diastolic (congestive) heart failure: Secondary | ICD-10-CM | POA: Diagnosis present

## 2014-12-28 DIAGNOSIS — N186 End stage renal disease: Secondary | ICD-10-CM | POA: Diagnosis present

## 2014-12-28 DIAGNOSIS — Z8546 Personal history of malignant neoplasm of prostate: Secondary | ICD-10-CM | POA: Diagnosis not present

## 2014-12-28 DIAGNOSIS — D528 Other folate deficiency anemias: Secondary | ICD-10-CM

## 2014-12-28 DIAGNOSIS — I25768 Atherosclerosis of bypass graft of coronary artery of transplanted heart with other forms of angina pectoris: Secondary | ICD-10-CM | POA: Diagnosis not present

## 2014-12-28 DIAGNOSIS — I25119 Atherosclerotic heart disease of native coronary artery with unspecified angina pectoris: Secondary | ICD-10-CM | POA: Diagnosis present

## 2014-12-28 DIAGNOSIS — N185 Chronic kidney disease, stage 5: Secondary | ICD-10-CM | POA: Diagnosis not present

## 2014-12-28 DIAGNOSIS — J181 Lobar pneumonia, unspecified organism: Secondary | ICD-10-CM

## 2014-12-28 DIAGNOSIS — Z992 Dependence on renal dialysis: Secondary | ICD-10-CM | POA: Diagnosis not present

## 2014-12-28 DIAGNOSIS — N179 Acute kidney failure, unspecified: Secondary | ICD-10-CM | POA: Diagnosis present

## 2014-12-28 DIAGNOSIS — K219 Gastro-esophageal reflux disease without esophagitis: Secondary | ICD-10-CM | POA: Diagnosis present

## 2014-12-28 DIAGNOSIS — A419 Sepsis, unspecified organism: Principal | ICD-10-CM | POA: Diagnosis present

## 2014-12-28 DIAGNOSIS — J9811 Atelectasis: Secondary | ICD-10-CM | POA: Diagnosis present

## 2014-12-28 DIAGNOSIS — Z951 Presence of aortocoronary bypass graft: Secondary | ICD-10-CM

## 2014-12-28 DIAGNOSIS — Z955 Presence of coronary angioplasty implant and graft: Secondary | ICD-10-CM | POA: Diagnosis not present

## 2014-12-28 DIAGNOSIS — J189 Pneumonia, unspecified organism: Secondary | ICD-10-CM | POA: Diagnosis present

## 2014-12-28 DIAGNOSIS — N184 Chronic kidney disease, stage 4 (severe): Secondary | ICD-10-CM | POA: Diagnosis not present

## 2014-12-28 DIAGNOSIS — Z7982 Long term (current) use of aspirin: Secondary | ICD-10-CM | POA: Diagnosis not present

## 2014-12-28 DIAGNOSIS — D509 Iron deficiency anemia, unspecified: Secondary | ICD-10-CM | POA: Diagnosis present

## 2014-12-28 DIAGNOSIS — R55 Syncope and collapse: Secondary | ICD-10-CM | POA: Diagnosis not present

## 2014-12-28 DIAGNOSIS — E1122 Type 2 diabetes mellitus with diabetic chronic kidney disease: Secondary | ICD-10-CM | POA: Diagnosis present

## 2014-12-28 DIAGNOSIS — Z9861 Coronary angioplasty status: Secondary | ICD-10-CM

## 2014-12-28 DIAGNOSIS — C61 Malignant neoplasm of prostate: Secondary | ICD-10-CM | POA: Diagnosis present

## 2014-12-28 DIAGNOSIS — E11649 Type 2 diabetes mellitus with hypoglycemia without coma: Secondary | ICD-10-CM | POA: Diagnosis present

## 2014-12-28 DIAGNOSIS — N189 Chronic kidney disease, unspecified: Secondary | ICD-10-CM | POA: Diagnosis present

## 2014-12-28 DIAGNOSIS — E1129 Type 2 diabetes mellitus with other diabetic kidney complication: Secondary | ICD-10-CM | POA: Diagnosis present

## 2014-12-28 LAB — CBC
HCT: 22.7 % — ABNORMAL LOW (ref 40.0–52.0)
Hemoglobin: 7.7 g/dL — ABNORMAL LOW (ref 13.0–18.0)
MCH: 28.9 pg (ref 26.0–34.0)
MCHC: 34 g/dL (ref 32.0–36.0)
MCV: 85.1 fL (ref 80.0–100.0)
Platelets: 81 10*3/uL — ABNORMAL LOW (ref 150–440)
RBC: 2.67 MIL/uL — ABNORMAL LOW (ref 4.40–5.90)
RDW: 16.3 % — AB (ref 11.5–14.5)
WBC: 5.1 10*3/uL (ref 3.8–10.6)

## 2014-12-28 LAB — BASIC METABOLIC PANEL
Anion gap: 10 (ref 5–15)
BUN: 68 mg/dL — AB (ref 6–20)
CALCIUM: 8.1 mg/dL — AB (ref 8.9–10.3)
CO2: 24 mmol/L (ref 22–32)
CREATININE: 5.42 mg/dL — AB (ref 0.61–1.24)
Chloride: 105 mmol/L (ref 101–111)
GFR calc Af Amer: 11 mL/min — ABNORMAL LOW (ref >60)
GFR, EST NON AFRICAN AMERICAN: 10 mL/min — AB (ref >60)
GLUCOSE: 106 mg/dL — AB (ref 65–99)
Potassium: 4.1 mmol/L (ref 3.5–5.1)
Sodium: 139 mmol/L (ref 135–145)

## 2014-12-28 LAB — CULTURE, RESPIRATORY W GRAM STAIN

## 2014-12-28 LAB — TYPE AND SCREEN
ABO/RH(D): A POS
ANTIBODY SCREEN: NEGATIVE
UNIT DIVISION: 0

## 2014-12-28 LAB — GLUCOSE, CAPILLARY
GLUCOSE-CAPILLARY: 98 mg/dL (ref 65–99)
GLUCOSE-CAPILLARY: 166 mg/dL — AB (ref 65–99)
GLUCOSE-CAPILLARY: 110 mg/dL — AB (ref 65–99)
GLUCOSE-CAPILLARY: 131 mg/dL — AB (ref 65–99)

## 2014-12-28 LAB — IRON AND TIBC
Iron: 21 ug/dL — ABNORMAL LOW (ref 45–182)
SATURATION RATIOS: 9 % — AB (ref 17.9–39.5)
TIBC: 244 ug/dL — ABNORMAL LOW (ref 250–450)
UIBC: 223 ug/dL

## 2014-12-28 LAB — CULTURE, RESPIRATORY

## 2014-12-28 LAB — MRSA PCR SCREENING: MRSA BY PCR: NEGATIVE

## 2014-12-28 MED ORDER — HYDROCODONE-ACETAMINOPHEN 5-325 MG PO TABS
1.0000 | ORAL_TABLET | Freq: Four times a day (QID) | ORAL | Status: AC | PRN
  Administered 2014-12-28 – 2014-12-29 (×2): 1 via ORAL
  Filled 2014-12-28 (×2): qty 1

## 2014-12-28 MED ORDER — HYDRALAZINE HCL 25 MG PO TABS
25.0000 mg | ORAL_TABLET | Freq: Two times a day (BID) | ORAL | Status: DC
  Administered 2014-12-28: 25 mg via ORAL
  Filled 2014-12-28 (×3): qty 1

## 2014-12-28 MED ORDER — ACETAMINOPHEN 325 MG PO TABS: 650.0000 mg | ORAL_TABLET | Freq: Four times a day (QID) | ORAL | Status: DC | PRN

## 2014-12-28 MED ORDER — INSULIN ASPART 100 UNIT/ML ~~LOC~~ SOLN
0.0000 [IU] | Freq: Every day | SUBCUTANEOUS | Status: DC
  Administered 2014-12-30: 2 [IU] via SUBCUTANEOUS

## 2014-12-28 MED ORDER — SODIUM CHLORIDE 0.9 % IV SOLN: 250.0000 mL | INTRAVENOUS | Status: DC | PRN

## 2014-12-28 MED ORDER — ONDANSETRON HCL 4 MG/2ML IJ SOLN: 4.0000 mg | Freq: Four times a day (QID) | INTRAMUSCULAR | Status: DC | PRN

## 2014-12-28 MED ORDER — DARBEPOETIN ALFA 200 MCG/0.4ML IJ SOSY
200.0000 ug | PREFILLED_SYRINGE | INTRAMUSCULAR | Status: DC
  Filled 2014-12-28: qty 0.4

## 2014-12-28 MED ORDER — NITROGLYCERIN 0.4 MG/SPRAY TL SOLN
1.0000 | Status: DC | PRN
  Administered 2015-01-01 (×2): 1 via SUBLINGUAL
  Filled 2014-12-28 (×2): qty 4.9

## 2014-12-28 MED ORDER — FERROUS SULFATE 325 (65 FE) MG PO TABS: 325.0000 mg | ORAL_TABLET | Freq: Every day | ORAL | Status: DC

## 2014-12-28 MED ORDER — INSULIN GLARGINE 100 UNIT/ML ~~LOC~~ SOLN
10.0000 [IU] | Freq: Every day | SUBCUTANEOUS | Status: DC
  Administered 2014-12-29 – 2015-01-03 (×4): 10 [IU] via SUBCUTANEOUS
  Filled 2014-12-28 (×8): qty 0.1

## 2014-12-28 MED ORDER — DARBEPOETIN ALFA 200 MCG/0.4ML IJ SOSY
200.0000 ug | PREFILLED_SYRINGE | INTRAMUSCULAR | Status: DC
  Administered 2014-12-28 – 2015-01-04 (×2): 200 ug via SUBCUTANEOUS
  Filled 2014-12-28 (×3): qty 0.4

## 2014-12-28 MED ORDER — ACETAMINOPHEN 650 MG RE SUPP: 650.0000 mg | Freq: Four times a day (QID) | RECTAL | Status: DC | PRN

## 2014-12-28 MED ORDER — ISOSORBIDE MONONITRATE ER 60 MG PO TB24
90.0000 mg | ORAL_TABLET | Freq: Every day | ORAL | Status: DC
  Filled 2014-12-28: qty 1

## 2014-12-28 MED ORDER — ASPIRIN EC 81 MG PO TBEC
81.0000 mg | DELAYED_RELEASE_TABLET | Freq: Every day | ORAL | Status: DC
  Administered 2014-12-29 – 2015-01-04 (×7): 81 mg via ORAL
  Filled 2014-12-28 (×7): qty 1

## 2014-12-28 MED ORDER — DIPHENHYDRAMINE HCL 25 MG PO CAPS
25.0000 mg | ORAL_CAPSULE | Freq: Every evening | ORAL | Status: DC | PRN
  Administered 2014-12-28 – 2014-12-29 (×2): 25 mg via ORAL
  Filled 2014-12-28 (×2): qty 1

## 2014-12-28 MED ORDER — HYDRALAZINE HCL 50 MG PO TABS: 50.0000 mg | ORAL_TABLET | Freq: Two times a day (BID) | ORAL | Status: DC

## 2014-12-28 MED ORDER — INSULIN ASPART 100 UNIT/ML ~~LOC~~ SOLN
0.0000 [IU] | Freq: Three times a day (TID) | SUBCUTANEOUS | Status: DC
  Administered 2014-12-29: 2 [IU] via SUBCUTANEOUS
  Administered 2014-12-29 – 2014-12-31 (×3): 3 [IU] via SUBCUTANEOUS
  Administered 2014-12-31 – 2015-01-01 (×2): 2 [IU] via SUBCUTANEOUS
  Administered 2015-01-02: 8 [IU] via SUBCUTANEOUS
  Administered 2015-01-03: 5 [IU] via SUBCUTANEOUS
  Administered 2015-01-03: 2 [IU] via SUBCUTANEOUS

## 2014-12-28 MED ORDER — PREGABALIN 75 MG PO CAPS
75.0000 mg | ORAL_CAPSULE | Freq: Two times a day (BID) | ORAL | Status: DC
  Administered 2014-12-28 – 2014-12-31 (×6): 75 mg via ORAL
  Filled 2014-12-28 (×6): qty 1

## 2014-12-28 MED ORDER — CALCITRIOL 0.25 MCG PO CAPS
0.2500 ug | ORAL_CAPSULE | Freq: Every day | ORAL | Status: DC
  Administered 2014-12-29 – 2015-01-03 (×6): 0.25 ug via ORAL
  Filled 2014-12-28 (×6): qty 1

## 2014-12-28 MED ORDER — BACITRACIN ZINC 500 UNIT/GM EX OINT
TOPICAL_OINTMENT | Freq: Two times a day (BID) | CUTANEOUS | Status: AC
  Administered 2014-12-29 (×2): via TOPICAL
  Administered 2014-12-30: 1 via TOPICAL
  Filled 2014-12-28: qty 28.35

## 2014-12-28 MED ORDER — SODIUM CHLORIDE 0.9 % IJ SOLN
3.0000 mL | Freq: Two times a day (BID) | INTRAMUSCULAR | Status: DC
  Administered 2014-12-28 – 2015-01-03 (×8): 3 mL via INTRAVENOUS

## 2014-12-28 MED ORDER — ROSUVASTATIN CALCIUM 10 MG PO TABS
10.0000 mg | ORAL_TABLET | Freq: Every day | ORAL | Status: DC
  Administered 2014-12-28 – 2015-01-03 (×7): 10 mg via ORAL
  Filled 2014-12-28 (×8): qty 1

## 2014-12-28 MED ORDER — TICAGRELOR 90 MG PO TABS
90.0000 mg | ORAL_TABLET | Freq: Two times a day (BID) | ORAL | Status: DC
  Administered 2014-12-28 – 2015-01-04 (×14): 90 mg via ORAL
  Filled 2014-12-28 (×16): qty 1

## 2014-12-28 MED ORDER — PIPERACILLIN-TAZOBACTAM IN DEX 2-0.25 GM/50ML IV SOLN
2.2500 g | Freq: Three times a day (TID) | INTRAVENOUS | Status: DC
  Administered 2014-12-28 – 2014-12-30 (×5): 2.25 g via INTRAVENOUS
  Filled 2014-12-28 (×9): qty 50

## 2014-12-28 MED ORDER — RENA-VITE PO TABS
1.0000 | ORAL_TABLET | Freq: Every day | ORAL | Status: DC
  Administered 2014-12-28 – 2015-01-03 (×7): 1 via ORAL
  Filled 2014-12-28 (×8): qty 1

## 2014-12-28 MED ORDER — ALUM & MAG HYDROXIDE-SIMETH 200-200-20 MG/5ML PO SUSP: 30.0000 mL | Freq: Four times a day (QID) | ORAL | Status: DC | PRN

## 2014-12-28 MED ORDER — ONDANSETRON HCL 4 MG PO TABS: 4.0000 mg | ORAL_TABLET | Freq: Four times a day (QID) | ORAL | Status: DC | PRN

## 2014-12-28 MED ORDER — SODIUM BICARBONATE 650 MG PO TABS: 1300.0000 mg | ORAL_TABLET | Freq: Two times a day (BID) | ORAL | Status: DC

## 2014-12-28 MED ORDER — DM-GUAIFENESIN ER 30-600 MG PO TB12
1.0000 | ORAL_TABLET | Freq: Two times a day (BID) | ORAL | Status: DC
  Administered 2014-12-28 – 2015-01-04 (×14): 1 via ORAL
  Filled 2014-12-28 (×15): qty 1

## 2014-12-28 MED ORDER — VANCOMYCIN HCL IN DEXTROSE 1-5 GM/200ML-% IV SOLN
1000.0000 mg | INTRAVENOUS | Status: DC
Start: 2014-12-29 — End: 2014-12-29
  Filled 2014-12-28: qty 200

## 2014-12-28 MED ORDER — DIPHENHYDRAMINE HCL 25 MG PO CAPS: 25.0000 mg | ORAL_CAPSULE | Freq: Every evening | ORAL | Status: DC | PRN

## 2014-12-28 MED ORDER — CARVEDILOL 3.125 MG PO TABS
9.7500 mg | ORAL_TABLET | Freq: Two times a day (BID) | ORAL | Status: DC
Start: 2014-12-28 — End: 2014-12-28

## 2014-12-28 MED ORDER — PNEUMOCOCCAL VAC POLYVALENT 25 MCG/0.5ML IJ INJ
0.5000 mL | INJECTION | INTRAMUSCULAR | Status: DC
  Filled 2014-12-28: qty 0.5

## 2014-12-28 MED ORDER — ROSUVASTATIN CALCIUM 20 MG PO TABS: 20.0000 mg | ORAL_TABLET | Freq: Every day | ORAL | Status: DC

## 2014-12-28 MED ORDER — NEPRO/CARBSTEADY PO LIQD
237.0000 mL | Freq: Two times a day (BID) | ORAL | Status: DC
  Administered 2014-12-29 – 2015-01-01 (×4): 237 mL via ORAL
  Filled 2014-12-28 (×6): qty 237

## 2014-12-28 MED ORDER — ISOSORBIDE MONONITRATE ER 60 MG PO TB24
90.0000 mg | ORAL_TABLET | Freq: Every day | ORAL | Status: DC
  Administered 2014-12-28 – 2015-01-03 (×7): 90 mg via ORAL
  Filled 2014-12-28 (×7): qty 1

## 2014-12-28 MED ORDER — CARVEDILOL 3.125 MG PO TABS
3.1250 mg | ORAL_TABLET | Freq: Two times a day (BID) | ORAL | Status: DC
  Administered 2014-12-28 – 2015-01-03 (×13): 3.125 mg via ORAL
  Filled 2014-12-28 (×14): qty 1

## 2014-12-28 MED ORDER — SODIUM CHLORIDE 0.9 % IJ SOLN: 3.0000 mL | INTRAMUSCULAR | Status: DC | PRN

## 2014-12-28 MED ORDER — ALPRAZOLAM 0.5 MG PO TABS
0.5000 mg | ORAL_TABLET | ORAL | Status: DC | PRN
  Administered 2015-01-01 – 2015-01-02 (×2): 0.5 mg via ORAL
  Filled 2014-12-28 (×2): qty 1

## 2014-12-28 MED ORDER — FUROSEMIDE 80 MG PO TABS: 120.0000 mg | ORAL_TABLET | Freq: Two times a day (BID) | ORAL | Status: DC

## 2014-12-28 MED ORDER — ZOLPIDEM TARTRATE 5 MG PO TABS
5.0000 mg | ORAL_TABLET | Freq: Every evening | ORAL | Status: DC | PRN
  Filled 2014-12-28: qty 1

## 2014-12-28 MED ORDER — RANOLAZINE ER 500 MG PO TB12
1000.0000 mg | ORAL_TABLET | Freq: Two times a day (BID) | ORAL | Status: DC
  Administered 2014-12-28 – 2015-01-03 (×13): 1000 mg via ORAL
  Filled 2014-12-28 (×16): qty 2

## 2014-12-28 NOTE — Discharge Summary (Signed)
Joseph [name], 69 y.o., DOB [birth year], MRN [id number]. Admission date: 12/23/2014 Discharge Date 12/28/2014 Primary MD Marden Noble, MD Admitting Physician Nicholes Mango, MD  Admission Diagnosis  Orthostatic hypotension [I95.1] RLL pneumonia [J18.9]  Discharge Diagnosis   Hypotension Bilateral pulmonary nodules felt to be due to infectious in nature Syncope End-stage renal disease now initiated on hemodialysis Coronary artery disease with recent MI Anemia Number cytopenia Likely pulmonary hypertension Hyperlipidemia Depression with anxiety Insulin-dependent diabetes Ischemic cardiomyopathy History of gallstone pancreatitis Chronic mixed systolic and diastolic CHF    Hospital Course  Joseph [name] is a 69 y.o. male with a known history of chronic systolic congestive heart failure, chronic kidney disease, insulin requiring diabetes medicines, coronary artery disease and myocardial infarction and chronic thrombocytopenia with baseline platelet count at around 60,000 who presented ED with a chief complaint of shortness of breath and productive cough for approximately 1 week. Patient actually was hypotensive and passed out and brought into the ED by EMS. Patient received fluid boluses to assist with his blood pressure. He was also noticed to have pneumonia on a chest x-ray. His hypotension was felt to be possibly related to the pneumonia. Patient also had a history of having hemodialysis and fistula in place. However he has not been dialyzed for about a year now. Due to increasing BUN/creatinine nephrology was consult that they recommended starting him on hemodialysis during the hospitalization. Patient was tolerating dialysis with blood pressure being borderline. Yesterday patient again had an episode of hypotension not responsive to the low blood pressure. He had to be transferred to the ICU. And was started on Levophed drip. Patient also received 1 unit of packed RBCs for the anemia.  He had a CT scan of the chest without contrast which showed bilateral pulmonary nodules consistent with likely acute infectious etiology. Patient was being treated with oral Levaquin which was switched to IV vancomycin and Zosyn. Patient today was seen in the ICU and the family requested that he be transferred to [facility] where he has received a lot of his care in the past his cardiologist is there. I have discussed the case with Dr. Posey Pronto a hospitalist who has graciously is accepted the patient in transfer.          Consults  pulmonary/intensive care, cardiology  Significant Tests:  See full reports for all details      Dg Chest 2 View  12/23/2014  CLINICAL DATA:  Syncopal episode this morning, fell several times, cough, CHF, hypertension, diabetes mellitus, former smoker EXAM: CHEST  2 VIEW COMPARISON:  11/03/2014 FINDINGS: Enlargement of cardiac silhouette post CABG. Mediastinal contours and pulmonary vascularity normal. RIGHT basilar infiltrate consistent with pneumonia. Remaining lungs clear. No pleural effusion or pneumothorax. IMPRESSION: Enlargement of cardiac silhouette post CABG. RIGHT basilar infiltrate consistent with pneumonia. Electronically Signed   By: Lavonia Dana M.D.   On: 12/23/2014 10:49   Ct Chest Wo Contrast  12/27/2014  CLINICAL DATA:  Shortness of breath. Prostate cancer. CABG. Former smoker. EXAM: CT CHEST WITHOUT CONTRAST TECHNIQUE: Multidetector CT imaging of the chest was performed following the standard protocol without IV contrast. COMPARISON:  03/04/2013 PET-CT.  12/23/2014 chest radiograph. FINDINGS: Images are motion degraded. Mediastinum/Nodes: Stable mild cardiomegaly. No pericardial fluid/thickening. Left main, left anterior descending, left circumflex and right coronary atherosclerosis, with ascending aortic and left internal mammary bypass grafts. Normal caliber thoracic aorta. Stable dilation of the main pulmonary artery (3.4 cm diameter). Normal  visualized thyroid. Normal esophagus. No axillary adenopathy.  Mildly enlarged 1.1 cm right paratracheal node (series 2/ image 13), previously 0.9 cm . Mildly enlarged 1.1 cm subcarinal node (2/26), increased from 0.9 cm. No gross hilar adenopathy on this noncontrast study. Lungs/Pleura: No pneumothorax. No pleural effusion. There is mild centrilobular emphysema. There are extensive patchy tree-in-bud opacities, patchy centrilobular nodular opacities and patchy peribronchovascular ground-glass opacity throughout both lungs, most prominent in the right upper, right middle and right lower lobes. Upper abdomen: Unremarkable. Musculoskeletal: No aggressive appearing focal osseous lesions. Median sternotomy wires are intact. Marked degenerative changes in the thoracic spine. IMPRESSION: 1. Extensive patchy tree-in-bud opacities, patchy centrilobular nodular opacities and patchy peribronchovascular ground-glass opacities throughout both lungs, asymmetrically involving the lobes of the right lung. These findings are most in keeping with an infectious bronchiolitis/developing bronchopneumonia, with the differential including bacterial, fungal or tuberculosis infections. 2. New mild mediastinal lymphadenopathy, nonspecific, likely reactive. 3. Mild centrilobular emphysema. 4. Left main and 3 vessel coronary atherosclerosis status post CABG. 5. Stable dilated main pulmonary artery, suggesting chronic pulmonary arterial hypertension. Electronically Signed   By: Ilona Sorrel M.D.   On: 12/27/2014 10:44   Dg Chest Port 1 View  12/27/2014  CLINICAL DATA:  Patient status post PICC line placement. Interval re- adjustment. EXAM: PORTABLE CHEST 1 VIEW COMPARISON:  Earlier same day FINDINGS: Interval re- adjustment PICC line with tip projecting over the superior vena cava. Stable cardiomegaly status post median sternotomy and CABG procedure. Unchanged heterogeneous opacities right lung base most compatible with pneumonia. No  pleural effusion or pneumothorax. IMPRESSION: Interval re- adjustment PICC line with tip projecting over the superior vena cava. Persistent heterogeneous opacities right lung base favored to represent pneumonia. Cardiomegaly. Electronically Signed   By: Lovey Newcomer M.D.   On: 12/27/2014 16:20   Dg Chest Port 1 View  12/27/2014  CLINICAL DATA:  Patient status post PICC line placement. EXAM: PORTABLE CHEST 1 VIEW COMPARISON:  Chest radiograph 12/23/2014. FINDINGS: Interval insertion of PICC line with tip projecting over the expected location of the central left brachiocephalic vein near the confluence with the superior vena cava. Stable cardiac and mediastinal contours, enlarged. Pulmonary vascular redistribution. No frank pulmonary edema. Re- demonstrated heterogeneous opacities within the right mid lower lung. No pleural effusion or pneumothorax. IMPRESSION: New PICC line tip projects at the confluence of the left brachiocephalic vein and superior vena cava. Consider repositioning. Cardiomegaly and pulmonary vascular redistribution. Re- demonstrated right mid and lower lung heterogeneous opacities most compatible with pneumonia. Electronically Signed   By: Lovey Newcomer M.D.   On: 12/27/2014 16:12       Today   Subjective:   Joseph [name]  still complains of cough and shortness of breath  Objective:   Blood pressure 139/73, pulse 90, temperature 98.1 F (36.7 C), temperature source Oral, resp. rate 33, height 5\' 10"  (1.778 m), weight 87.952 kg (193 lb 14.4 oz), SpO2 95 %.  .  Intake/Output Summary (Last 24 hours) at 12/28/14 1110 Last data filed at 12/28/14 1000  Gross per 24 hour  Intake 833.88 ml  Output    250 ml  Net 583.88 ml    Exam VITAL SIGNS: Blood pressure 139/73, pulse 90, temperature 98.1 F (36.7 C), temperature source Oral, resp. rate 33, height 5\' 10"  (1.778 m), weight 87.952 kg (193 lb 14.4 oz), SpO2 95 %.  GENERAL:  69 y.o.-year-old patient lying in the bed with no acute  distress.  EYES: Pupils equal, round, reactive to light and accommodation. No scleral icterus. Extraocular muscles intact.  HEENT: Head atraumatic, normocephalic. Oropharynx and nasopharynx clear.  NECK:  Supple, no jugular venous distention. No thyroid enlargement, no tenderness.  LUNGS bronchial breath sounds bilaterally without any necessary muscle usage CARDIOVASCULAR: S1, S2 normal. No murmurs, rubs, or gallops.  ABDOMEN: Soft, nontender, nondistended. Bowel sounds present. No organomegaly or mass.  EXTREMITIES: No pedal edema, cyanosis, or clubbing.  NEUROLOGIC: Cranial nerves II through XII are intact. Muscle strength 5/5 in all extremities. Sensation intact. Gait not checked.  PSYCHIATRIC: The patient is alert and oriented x 3.  SKIN: No obvious rash, lesion, or ulcer.   Data Review     CBC w Diff: Lab Results  Component Value Date   WBC 5.1 12/28/2014   WBC 6.5 09/11/2014   WBC 4.7 05/26/2014   HGB 7.7* 12/28/2014   HGB 10.2* 05/26/2014   HGB 15.8 03/23/2008   HCT 22.7* 12/28/2014   HCT 28.9* 09/11/2014   HCT 30.6* 05/26/2014   HCT 46.2 03/23/2008   PLT 81* 12/28/2014   PLT 80* 05/26/2014   PLT 93* 03/23/2008   LYMPHOPCT 8% 12/27/2014   LYMPHOPCT 14.2 05/26/2014   LYMPHOPCT 22.3 03/23/2008   MONOPCT 10% 12/27/2014   MONOPCT 6.4 05/26/2014   MONOPCT 5.9 03/23/2008   EOSPCT 1% 12/27/2014   EOSPCT 1.6 05/26/2014   EOSPCT 1.5 03/23/2008   BASOPCT 1% 12/27/2014   BASOPCT 0.5 05/26/2014   BASOPCT 1.5 03/23/2008   CMP: Lab Results  Component Value Date   NA 139 12/28/2014   NA 143 09/11/2014   NA 143 01/05/2014   NA 139 02/04/2008   K 4.1 12/28/2014   K 5.5* 01/05/2014   K 4.3 02/04/2008   CL 105 12/28/2014   CL 112* 01/05/2014   CL 102 02/04/2008   CO2 24 12/28/2014   CO2 21 01/05/2014   CO2 28 02/04/2008   BUN 68* 12/28/2014   BUN 57* 09/11/2014   BUN 88* 01/05/2014   BUN 18 02/04/2008   CREATININE 5.42* 12/28/2014   CREATININE 3.52* 05/26/2014    CREATININE 1.3* 02/04/2008   PROT 7.9 12/11/2014   PROT 8.0 11/30/2013   PROT 6.9 02/04/2008   ALBUMIN 2.9* 12/26/2014   ALBUMIN 3.6 11/30/2013   ALBUMIN 3.8 02/04/2008   BILITOT 0.9 12/11/2014   BILITOT 0.7 11/30/2013   BILITOT 0.90 02/04/2008   ALKPHOS 77 12/11/2014   ALKPHOS 152* 11/30/2013   ALKPHOS 66 02/04/2008   AST 20 12/11/2014   AST 33 11/30/2013   AST 28 02/04/2008   ALT 14* 12/11/2014   ALT 36 11/30/2013   ALT 23 02/04/2008  .  Micro Results Recent Results (from the past 240 hour(s))  Culture, blood (routine x 2) Call MD if unable to obtain prior to antibiotics being given     Status: None (Preliminary result)   Collection Time: 12/23/14  3:54 PM  Result Value Ref Range Status   Specimen Description BLOOD RIGHT ASSIST CONTROL  Final   Special Requests   Final    BOTTLES DRAWN AEROBIC AND ANAEROBIC 8CC AERO 5CC ANA   Culture NO GROWTH 4 DAYS  Final   Report Status PENDING  Incomplete  Culture, blood (routine x 2) Call MD if unable to obtain prior to antibiotics being given     Status: None (Preliminary result)   Collection Time: 12/23/14  5:24 PM  Result Value Ref Range Status   Specimen Description BLOOD RIGHT HAND  Final   Special Requests BOTTLES DRAWN AEROBIC AND ANAEROBIC 4CC  Final   Culture  NO GROWTH 4 DAYS  Final   Report Status PENDING  Incomplete  Culture, sputum-assessment     Status: None   Collection Time: 12/24/14 12:38 PM  Result Value Ref Range Status   Specimen Description EXPECTORATED SPUTUM  Final   Special Requests NONE  Final   Sputum evaluation THIS SPECIMEN IS ACCEPTABLE FOR SPUTUM CULTURE  Final   Report Status 12/24/2014 FINAL  Final  Culture, respiratory (NON-Expectorated)     Status: None   Collection Time: 12/24/14 12:38 PM  Result Value Ref Range Status   Specimen Description EXPECTORATED SPUTUM  Final   Special Requests NONE Reflexed from [id number]  Final   Gram Stain   Final    GOOD SPECIMEN - 80-90% WBCS MANY WBC  SEEN MODERATE GRAM POSITIVE RODS FEW GRAM POSITIVE COCCI    Culture LIGHT GROWTH STAPHYLOCOCCUS AUREUS  Final   Report Status 12/28/2014 FINAL  Final   Organism ID, Bacteria STAPHYLOCOCCUS AUREUS  Final      Susceptibility   Staphylococcus aureus - MIC*    CIPROFLOXACIN <=0.5 SENSITIVE Sensitive     GENTAMICIN <=0.5 SENSITIVE Sensitive     OXACILLIN 0.5 SENSITIVE Sensitive     TRIMETH/SULFA <=10 SENSITIVE Sensitive     CEFOXITIN SCREEN NEGATIVE Sensitive     Inducible Clindamycin NEGATIVE Sensitive     ERYTHROMYCIN Value in next row Sensitive      SENSITIVE0.5    TETRACYCLINE Value in next row Sensitive      SENSITIVE<=1    CLINDAMYCIN Value in next row Sensitive      SENSITIVE<=0.25    LEVOFLOXACIN Value in next row Sensitive      SENSITIVE0.25    LINEZOLID Value in next row Sensitive      SENSITIVE2    * LIGHT GROWTH STAPHYLOCOCCUS AUREUS  MRSA PCR Screening     Status: None   Collection Time: 12/27/14  1:52 AM  Result Value Ref Range Status   MRSA by PCR NEGATIVE NEGATIVE Final    Comment:        The GeneXpert MRSA Assay (FDA approved for NASAL specimens only), is one component of a comprehensive MRSA colonization surveillance program. It is not intended to diagnose MRSA infection nor to guide or monitor treatment for MRSA infections.         Code Status Orders        Start     Ordered   12/23/14 1535  Full code   Continuous     12/23/14 1534    Advance Directive Documentation        Most Recent Value   Type of Advance Directive  Healthcare Power of Attorney   Pre-existing out of facility DNR order (yellow form or pink MOST form)     "MOST" Form in Place?              Discharge Medications     Medication List    ASK your doctor about these medications        ALPRAZolam 0.5 MG tablet  Commonly known as:  XANAX  Take 0.5 mg by mouth as needed for anxiety.     aspirin EC 81 MG tablet  Take 81 mg by mouth daily.     calcitRIOL 0.25 MCG  capsule  Commonly known as:  ROCALTROL  Take 0.25 mcg by mouth daily.     carvedilol 6.25 MG tablet  Commonly known as:  COREG  Take 1.5 tablets (9.375 mg total) by mouth 2 (two) times daily.  ferrous sulfate 325 (65 FE) MG EC tablet  Take 325 mg by mouth daily with breakfast.     furosemide 40 MG tablet  Commonly known as:  LASIX  Take 120 mg by mouth 2 (two) times daily.     hydrALAZINE 50 MG tablet  Commonly known as:  APRESOLINE  Take 1 tablet (50 mg total) by mouth 2 (two) times daily.     insulin glargine 100 UNIT/ML injection  Commonly known as:  LANTUS  Inject 20 Units into the skin at bedtime.     isosorbide mononitrate 60 MG 24 hr tablet  Commonly known as:  IMDUR  Take 1.5 tablets (90 mg total) by mouth daily.     nitroGLYCERIN 0.4 MG/SPRAY spray  Commonly known as:  NITROLINGUAL  Place 1 spray under the tongue every 5 (five) minutes x 3 doses as needed for chest pain. Do not use together with sublingual nitro     pregabalin 75 MG capsule  Commonly known as:  LYRICA  Take 75 mg by mouth 2 (two) times daily.     ranolazine 1000 MG SR tablet  Commonly known as:  RANEXA  Take 1 tablet (1,000 mg total) by mouth 2 (two) times daily.     rosuvastatin 20 MG tablet  Commonly known as:  CRESTOR  Take 20 mg by mouth at bedtime.     sodium bicarbonate 650 MG tablet  Take 2 tablets (1,300 mg total) by mouth 2 (two) times daily.     ticagrelor 90 MG Tabs tablet  Commonly known as:  BRILINTA  Take 1 tablet (90 mg total) by mouth 2 (two) times daily.           Total Time in preparing paper work, data evaluation and todays exam - total time seeing the patient today and arranging discharge 90 Duane Lope M.D on 12/28/2014 at 11:10 AM  [place]  [phone]

## 2014-12-28 NOTE — Evaluation (Signed)
Clinical/Bedside Swallow Evaluation Patient Details  Name: TRES PARLETTE MRN: [id number] Date of Birth: Nov 04, 1945  Today's Date: 12/28/2014 Time: SLP Start Time (ACUTE ONLY): [id number] SLP Stop Time (ACUTE ONLY): 1544 SLP Time Calculation (min) (ACUTE ONLY): 14 min  Past Medical History:  Past Medical History  Diagnosis Date  . Hypertension   . Hypercholesterolemia   . Prostate cancer ([place])   . Hepatosplenomegaly 2015    fibrosis, no cirrhosis on 06/2013 liver biopsy  . Thrombocytopenia ([place]) 2015  . Myocardial infarction ([place]) 2001  . Depression with anxiety   . IDDM (insulin dependent diabetes mellitus) ([place])     INSULIN DEPENDENT  . GERD (gastroesophageal reflux disease)   . History of shingles   . Ischemic cardiomyopathy     a. echo 08/06/2014: EF 25-30%, multiple WMA, mod DD, PASP 49 mm Hg   . CKD (chronic kidney disease), stage IV ([facility])     a. previously on dialysis; b. followed by Dr. Abigail Butts   . Coronary artery disease, occlusive     a. s/p 4v CABG 2001(LIMA-LAD, SVG-D2, SVG-OM1, SVG-right PDA, SVG-right PL1); b. cath 07/30/2014 vein grafts all down, patent LIMA to LAD, significant LM and ost LCx dx; c. cath 08/07/2014 Synergy DES 2.5 mm x 18 mm to dist LM and ost LCx, rec lifelong DAPT  . Coronary artery disease involving coronary bypass graft 08/2014    all vein grafts occluded. Patent LIMA-LAD -- s/p PCI ot LM-Cx;   [name] Chronic combined systolic and diastolic CHF, NYHA class 2 ([place])     a. echo 08/2013: EF 40-45%, impaired relaxation, mild MR, LA mildly dilated,    . Gallstone pancreatitis 2015   Past Surgical History:  Past Surgical History  Procedure Laterality Date  . Cardiac surgery    . Shoulder arthroscopy    . Penile prosthesis implant    . Myocardial infarction    . Cardiac catheterization    . Coronary angioplasty    . Coronary artery bypass graft  2001  . Colonoscopy    . Liver biopsy    . Av fistula placement Left 2015  . Cardiac catheterization N/A  07/30/2014    Procedure: Left Heart Cath and Cors/Grafts Angiography;  Surgeon: Dionisio David, MD;  Location: [facility];  Service: Cardiovascular;  Laterality: N/A;  . Cardiac catheterization N/A 08/07/2014    Procedure: Coronary Stent Intervention;  Surgeon: Lorretta Harp, MD;  Location: [facility];  Service: Cardiovascular;  Laterality: N/A;   HPI:  DAVAN DUBS is a 69 y.o. male with history of GERD, chronic systolic CHF, ESRD, previously on dialysis IDDM, CAD was transferred from [facility] per the request of the patient. Patient had her presented with worsening CHF, cough, shortness of breath, found to have right lower lung pneumonia   Assessment / Plan / Recommendation Clinical Impression  Patient presents with normal oropharyngeal swallowing function without evidence of aspiration. No SLP f/u indicated at this time.     Aspiration Risk  Mild aspiration risk (with increased RR)    Diet Recommendation   Regular solids, thin liquids General safe swallowing precautions  Medication Administration: Whole meds with liquid    Other  Recommendations Oral Care Recommendations: Oral care BID   Follow up Recommendations  None           Swallow Study   General HPI: QUARTEZ BRADEEN is a 69 y.o. male with history of GERD, chronic systolic CHF, ESRD, previously on dialysis IDDM,  CAD was transferred from [facility] per the request of the patient. Patient had her presented with worsening CHF, cough, shortness of breath, found to have right lower lung pneumonia Type of Study: Bedside Swallow Evaluation Previous Swallow Assessment: none noted Diet Prior to this Study: Regular;Thin liquids Temperature Spikes Noted: No Respiratory Status: Nasal cannula History of Recent Intubation: No Behavior/Cognition: Alert;Cooperative;Pleasant mood Oral Cavity Assessment: Within Functional Limits Oral Care Completed by SLP: No Oral Cavity - Dentition: Dentures,  top;Dentures, bottom Vision: Functional for self-feeding Self-Feeding Abilities: Able to feed self Patient Positioning: Upright in bed Baseline Vocal Quality: Normal Volitional Cough: Strong;Congested Volitional Swallow: Able to elicit    Oral/Motor/Sensory Function Overall Oral Motor/Sensory Function: Within functional limits   Ice Chips Ice chips: Not tested   Thin Liquid Thin Liquid: Within functional limits Presentation: Cup;Self Fed;Straw    Nectar Thick Nectar Thick Liquid: Not tested   Honey Thick Honey Thick Liquid: Not tested   Puree Puree: Within functional limits Presentation: Self Fed;Spoon   Solid Solid: Within functional limits Presentation: [place], [place] [phone]  [place] 12/28/2014,3:46 PM

## 2014-12-28 NOTE — Progress Notes (Signed)
[facility] [place] NAME: Joseph [name]    MR#:  [id number]  DATE OF BIRTH:  1945/09/09  SUBJECTIVE:  CHIEF COMPLAINT:   Chief Complaint  Patient presents with  . Loss of Consciousness  . Hypotension     Patient continues to complain of cough and shortness of breath he is off the Levophed drip blood pressures currently stable  Review of Systems  Constitutional: Negative for fever, chills and weight loss.  HENT: Negative for congestion.   Eyes: Negative for blurred vision and double vision.  Respiratory: Positive for cough and shortness of breath. Negative for sputum production and wheezing.   Cardiovascular: Negative for chest pain, palpitations, orthopnea, leg swelling and PND.  Gastrointestinal: Negative for nausea, vomiting, abdominal pain, diarrhea, constipation and blood in stool.  Genitourinary: Negative for dysuria, urgency, frequency and hematuria.  Musculoskeletal: Negative for falls.  Neurological: Negative for dizziness, tremors, focal weakness and headaches.  Endo/Heme/Allergies: Does not bruise/bleed easily.  Psychiatric/Behavioral: Negative for depression. The patient does not have insomnia.     VITAL SIGNS: Blood pressure 139/73, pulse 90, temperature 98.1 F (36.7 C), temperature source Oral, resp. rate 33, height 5\' 10"  (1.778 m), weight 87.952 kg (193 lb 14.4 oz), SpO2 95 %.  PHYSICAL EXAMINATION:   GENERAL:  69 y.o.-year-old patient lying in the bed with no acute distress.  EYES: Pupils equal, round, reactive to light and accommodation. No scleral icterus. Extraocular muscles intact.  HEENT: Head atraumatic, normocephalic. Oropharynx and nasopharynx clear.  NECK:  Supple, no jugular venous distention. No thyroid enlargement, no tenderness.  LUNGS: Normal breath sounds bilaterally, no wheezing, rales,rhonchi , few right-sided crepitations posteriorly. No use of accessory muscles of respiration.   CARDIOVASCULAR: S1, S2 normal. No murmurs, rubs, or gallops.  ABDOMEN: Soft, nontender, nondistended. Bowel sounds present. No organomegaly or mass.  EXTREMITIES: No pedal edema, cyanosis, or clubbing. Left upper extremity AV fistula with good bruit and thrill NEUROLOGIC: Cranial nerves II through XII are intact. Muscle strength 5/5 in all extremities. Sensation intact. Gait not checked.  PSYCHIATRIC: The patient is alert and oriented x 3.  SKIN: No obvious rash, lesion, or ulcer.   ORDERS/RESULTS REVIEWED:   CBC  Recent Labs Lab 12/23/14 1554 12/25/14 0439 12/26/14 1208 12/27/14 1135 12/28/14 0445  WBC 4.5 4.6 4.7 4.7 5.1  HGB 7.5* 7.5* 7.3* 6.9* 7.7*  HCT 22.5* 23.0* 21.6* 20.7* 22.7*  PLT 77* 70* 73* 89* 81*  MCV 85.0 84.9 84.2 84.8 85.1  MCH 28.2 27.9 28.4 28.1 28.9  MCHC 33.2 32.9 33.8 33.2 34.0  RDW 16.1* 17.0* 16.6* 16.3* 16.3*  LYMPHSABS  --   --   --  0.4*  --   MONOABS  --   --   --  0.5  --   EOSABS  --   --   --  0.0  --   BASOSABS  --   --   --  0.0  --    ------------------------------------------------------------------------------------------------------------------  Chemistries   Recent Labs Lab 12/23/14 0955 12/23/14 1554 12/25/14 0756 12/26/14 1208 12/27/14 1135 12/28/14 0445  NA 136  --  137 142 140 139  K 4.9  --  4.5 4.2 4.2 4.1  CL 98*  --  104 107 105 105  CO2 23  --  20* 23 25 24   GLUCOSE 170*  --  221* 167* 154* 106*  BUN 131*  --  115* 92* 66* 68*  CREATININE 8.66* 8.61* 7.51* 5.84*  5.28* 5.42*  CALCIUM 8.1*  --  7.5* 8.0* 7.9* 8.1*   ------------------------------------------------------------------------------------------------------------------ estimated creatinine clearance is 15 mL/min (by C-G formula based on Cr of 5.42). ------------------------------------------------------------------------------------------------------------------ No results for input(s): TSH, T4TOTAL, T3FREE, THYROIDAB in the last 72 hours.  Invalid  input(s): FREET3  Cardiac Enzymes  Recent Labs Lab 12/23/14 2206 12/24/14 0340 12/25/14 0756  TROPONINI 0.08* 0.08* 0.06*   ------------------------------------------------------------------------------------------------------------------ Invalid input(s): POCBNP ---------------------------------------------------------------------------------------------------------------  RADIOLOGY: Ct Chest Wo Contrast  12/27/2014  CLINICAL DATA:  Shortness of breath. Prostate cancer. CABG. Former smoker. EXAM: CT CHEST WITHOUT CONTRAST TECHNIQUE: Multidetector CT imaging of the chest was performed following the standard protocol without IV contrast. COMPARISON:  03/04/2013 PET-CT.  12/23/2014 chest radiograph. FINDINGS: Images are motion degraded. Mediastinum/Nodes: Stable mild cardiomegaly. No pericardial fluid/thickening. Left main, left anterior descending, left circumflex and right coronary atherosclerosis, with ascending aortic and left internal mammary bypass grafts. Normal caliber thoracic aorta. Stable dilation of the main pulmonary artery (3.4 cm diameter). Normal visualized thyroid. Normal esophagus. No axillary adenopathy. Mildly enlarged 1.1 cm right paratracheal node (series 2/ image 13), previously 0.9 cm . Mildly enlarged 1.1 cm subcarinal node (2/26), increased from 0.9 cm. No gross hilar adenopathy on this noncontrast study. Lungs/Pleura: No pneumothorax. No pleural effusion. There is mild centrilobular emphysema. There are extensive patchy tree-in-bud opacities, patchy centrilobular nodular opacities and patchy peribronchovascular ground-glass opacity throughout both lungs, most prominent in the right upper, right middle and right lower lobes. Upper abdomen: Unremarkable. Musculoskeletal: No aggressive appearing focal osseous lesions. Median sternotomy wires are intact. Marked degenerative changes in the thoracic spine. IMPRESSION: 1. Extensive patchy tree-in-bud opacities, patchy centrilobular  nodular opacities and patchy peribronchovascular ground-glass opacities throughout both lungs, asymmetrically involving the lobes of the right lung. These findings are most in keeping with an infectious bronchiolitis/developing bronchopneumonia, with the differential including bacterial, fungal or tuberculosis infections. 2. New mild mediastinal lymphadenopathy, nonspecific, likely reactive. 3. Mild centrilobular emphysema. 4. Left main and 3 vessel coronary atherosclerosis status post CABG. 5. Stable dilated main pulmonary artery, suggesting chronic pulmonary arterial hypertension. Electronically Signed   By: Ilona Sorrel M.D.   On: 12/27/2014 10:44   Dg Chest Port 1 View  12/27/2014  CLINICAL DATA:  Patient status post PICC line placement. Interval re- adjustment. EXAM: PORTABLE CHEST 1 VIEW COMPARISON:  Earlier same day FINDINGS: Interval re- adjustment PICC line with tip projecting over the superior vena cava. Stable cardiomegaly status post median sternotomy and CABG procedure. Unchanged heterogeneous opacities right lung base most compatible with pneumonia. No pleural effusion or pneumothorax. IMPRESSION: Interval re- adjustment PICC line with tip projecting over the superior vena cava. Persistent heterogeneous opacities right lung base favored to represent pneumonia. Cardiomegaly. Electronically Signed   By: Lovey Newcomer M.D.   On: 12/27/2014 16:20   Dg Chest Port 1 View  12/27/2014  CLINICAL DATA:  Patient status post PICC line placement. EXAM: PORTABLE CHEST 1 VIEW COMPARISON:  Chest radiograph 12/23/2014. FINDINGS: Interval insertion of PICC line with tip projecting over the expected location of the central left brachiocephalic vein near the confluence with the superior vena cava. Stable cardiac and mediastinal contours, enlarged. Pulmonary vascular redistribution. No frank pulmonary edema. Re- demonstrated heterogeneous opacities within the right mid lower lung. No pleural effusion or pneumothorax.  IMPRESSION: New PICC line tip projects at the confluence of the left brachiocephalic vein and superior vena cava. Consider repositioning. Cardiomegaly and pulmonary vascular redistribution. Re- demonstrated right mid and lower lung heterogeneous opacities most compatible with pneumonia.  Electronically Signed   By: Lovey Newcomer M.D.   On: 12/27/2014 16:12    EKG:  Orders placed or performed during the hospital encounter of 12/23/14  . ED EKG  . ED EKG  . EKG 12-Lead  . EKG 12-Lead  . EKG 12-Lead  . EKG 12-Lead  . EKG 12-Lead  . EKG 12-Lead  . EKG 12-Lead  . EKG 12-Lead    ASSESSMENT AND PLAN:  1. Syncope, likely due to hypotension, likely multifactorial including anemia, systolic dysfunction. Levophed drip now discontinued. Random cortisol level normal. Continue IV fluids  2. Hypotension  may need Midodrine for blood pressure support 3. Bilateral pulmonary nodules felt to be due to infectious in nature per pulmonary, currently on broad-spectrum anabiotic's was on Levaquin. 4. End-stage renal disease, nephrologist initiated  hemodialysis via left upper extremity AV fistula,  further hemodialysis per nephrology plan for tomorrow 5. Anemia of chronic disease due to persistent hypotension and he received transfusion, recent iron panel consistent with anemia of chronic disease. Patient has had bone marrow biopsies in the past due to thrombocytopenia and anemia. Will need hematology evaluation 6. Chronic thrombocytopenia, stable 7. Elevated troponin, likely demand ischemia due to hypotension,. Patient with recent MI- due to hypotension Coreg and Imdur have been stopped.     DRUG ALLERGIES:  Allergies  Allergen Reactions  . Sulfa Antibiotics Itching    hives  . Morphine And Related     Makes patient feel weird    CODE STATUS:     Code Status Orders        Start     Ordered   12/23/14 1535  Full code   Continuous     12/23/14 1534    Advance Directive Documentation         Most Recent Value   Type of Advance Directive  Healthcare Power of Attorney   Pre-existing out of facility DNR order (yellow form or pink MOST form)     "MOST" Form in Place?        TOTAL TIME TAKING CARE OF THIS PATIENT:66min  Dustin Flock M.D on 12/28/2014 at 11:06 AM  Between 7am to 6pm - Pager - [phone]  After 6pm go to [email or web address] - password [email or web address] [place]  Office  [phone]  CC: Primary care physician; Marden Noble, MD

## 2014-12-28 NOTE — Consult Note (Signed)
Reason for Consult:CKD, AKI Referring Physician: [name] is an 69 y.o. male.  HPI: 53yr male with hx of Cad, low EF 30-35%, GERD, Prostate Ca,  S/p CABG with occluded grafts and then stents place 7/16.  Here with CP 9/16 and seen by [place].  Followed by Dr. Abigail Butts in [place].  Presented with low bp, pneu , recurrent syncope on 11/16.  Tx for pneu and despite that had low bp requiring pressors on 11/20. But at that time was on Coreg/hydral and had HD x 2..  Had stage 5 CKD with Cr in high 4s, low 5s.  Known DM/HTN dz but had AKI and was on HD 10/15-3/16 and got off.  Recently no edema, but freq CP (hosp in [place] in Oct for CP).  Had HD on Fri and Sat via avf RLA.  Transferred to [facility] for furthur tx of pneu, cp, low bps, and CKD. Constitutional: no acute prob Eyes: negative Ears, nose, mouth, throat, and face: negative Respiratory: cough, phlegm , no fevers Cardiovascular: see Cards Gastrointestinal: negative Genitourinary:negative Integument/breast: bruises Hematologic/lymphatic: anemia Musculoskeletal:negative Endocrine: bs <200 Allergic/Immunologic: sulfa, MS    [name]. . . Access RLA AVF.  Past Medical History  Diagnosis Date  . Hypertension   . Hypercholesterolemia   . Prostate cancer ([place])   . Hepatosplenomegaly 2015    fibrosis, no cirrhosis on 06/2013 liver biopsy  . Thrombocytopenia ([place]) 2015  . Myocardial infarction ([place]) 2001  . Depression with anxiety   . IDDM (insulin dependent diabetes mellitus) ([place])     INSULIN DEPENDENT  . GERD (gastroesophageal reflux disease)   . History of shingles   . Ischemic cardiomyopathy     a. echo 08/06/2014: EF 25-30%, multiple WMA, mod DD, PASP 49 mm Hg   . CKD (chronic kidney disease), stage IV (HCC)     a. previously on dialysis; b. followed by Dr. Abigail Butts   . Coronary artery disease, occlusive     a. s/p 4v CABG 2001(LIMA-LAD, SVG-D2, SVG-OM1, SVG-right PDA, SVG-right PL1); b. cath 07/30/2014 vein  grafts all down, patent LIMA to LAD, significant LM and ost LCx dx; c. cath 08/07/2014 Synergy DES 2.5 mm x 18 mm to dist LM and ost LCx, rec lifelong DAPT  . Coronary artery disease involving coronary bypass graft 08/2014    all vein grafts occluded. Patent LIMA-LAD -- s/p PCI ot LM-Cx;   [name] Chronic combined systolic and diastolic CHF, NYHA class 2 ([name])     a. echo 08/2013: EF 40-45%, impaired relaxation, mild MR, LA mildly dilated,    . Gallstone pancreatitis 2015    Past Surgical History  Procedure Laterality Date  . Cardiac surgery    . Shoulder arthroscopy    . Penile prosthesis implant    . Myocardial infarction    . Cardiac catheterization    . Coronary angioplasty    . Coronary artery bypass graft  2001  . Colonoscopy    . Liver biopsy    . Av fistula placement Left 2015  . Cardiac catheterization N/A 07/30/2014    Procedure: Left Heart Cath and Cors/Grafts Angiography;  Surgeon: Dionisio David, MD;  Location: [place] [facility];  Service: Cardiovascular;  Laterality: N/A;  . Cardiac catheterization N/A 08/07/2014    Procedure: Coronary Stent Intervention;  Surgeon: Lorretta Harp, MD;  Location: [place] [facility];  Service: Cardiovascular;  Laterality: N/A;    Family History  Problem Relation Age of Onset  .  Acute myelogenous leukemia Brother   . CAD Brother   . Diabetes Mellitus II Brother   . Heart disease Father 33    CABG    Social History:  reports that he quit smoking about 6 years ago. He has quit using smokeless tobacco. He reports that he does not drink alcohol or use illicit drugs.  Allergies:  Allergies  Allergen Reactions  . Sulfa Antibiotics Itching    hives  . Morphine And Related     Makes patient feel weird    Medications:  I have reviewed the patient's current medications. Prior to Admission:  Prescriptions prior to admission  Medication Sig Dispense Refill Last Dose  . ALPRAZolam (XANAX) 0.5 MG tablet Take 0.5 mg by mouth as needed for  anxiety.   12/27/2014 at Unknown time  . aspirin EC 81 MG tablet Take 81 mg by mouth daily.   12/28/2014 at 1000  . calcitRIOL (ROCALTROL) 0.25 MCG capsule Take 0.25 mcg by mouth daily.    12/28/2014 at Unknown time  . carvedilol (COREG) 6.25 MG tablet Take 1.5 tablets (9.375 mg total) by mouth 2 (two) times daily. 270 tablet 3 12/28/2014 at 1000  . ferrous sulfate 325 (65 FE) MG EC tablet Take 325 mg by mouth daily with breakfast.    12/28/2014 at Unknown time  . hydrALAZINE (APRESOLINE) 50 MG tablet Take 1 tablet (50 mg total) by mouth 2 (two) times daily. 60 tablet 5 Past Week at Unknown time  . insulin glargine (LANTUS) 100 UNIT/ML injection Inject 20 Units into the skin at bedtime.   Past Week at Unknown time  . isosorbide mononitrate (IMDUR) 60 MG 24 hr tablet Take 1.5 tablets (90 mg total) by mouth daily. 135 tablet 3 12/28/2014 at Unknown time  . nitroGLYCERIN (NITROLINGUAL) 0.4 MG/SPRAY spray Place 1 spray under the tongue every 5 (five) minutes x 3 doses as needed for chest pain. Do not use together with sublingual nitro 12 g 5 Past Week at Unknown time  . pregabalin (LYRICA) 75 MG capsule Take 75 mg by mouth 2 (two) times daily.   12/28/2014 at Unknown time  . ranolazine (RANEXA) 1000 MG SR tablet Take 1 tablet (1,000 mg total) by mouth 2 (two) times daily. 60 tablet 6 12/28/2014 at Unknown time  . rosuvastatin (CRESTOR) 20 MG tablet Take 20 mg by mouth at bedtime.    12/27/2014 at Unknown time  . sodium bicarbonate 650 MG tablet Take 2 tablets (1,300 mg total) by mouth 2 (two) times daily. 120 tablet 5 12/28/2014 at Unknown time  . ticagrelor (BRILINTA) 90 MG TABS tablet Take 1 tablet (90 mg total) by mouth 2 (two) times daily. 60 tablet 6 12/28/2014 at Unknown time  . furosemide (LASIX) 40 MG tablet Take 120 mg by mouth 2 (two) times daily.    12/23/2014 at am   , Calcitriol .5 mcg.   Results for orders placed or performed during the hospital encounter of 12/23/14 (from the past 48  hour(s))  Glucose, capillary     Status: Abnormal   Collection Time: 12/26/14  9:05 PM  Result Value Ref Range   Glucose-Capillary 117 (H) 65 - 99 mg/dL   Comment 1 Notify RN   MRSA PCR Screening     Status: None   Collection Time: 12/27/14  1:52 AM  Result Value Ref Range   MRSA by PCR NEGATIVE NEGATIVE    Comment:        The GeneXpert MRSA Assay (FDA approved for  NASAL specimens only), is one component of a comprehensive MRSA colonization surveillance program. It is not intended to diagnose MRSA infection nor to guide or monitor treatment for MRSA infections.   Glucose, capillary     Status: Abnormal   Collection Time: 12/27/14  7:21 AM  Result Value Ref Range   Glucose-Capillary 134 (H) 65 - 99 mg/dL  Occult blood card to lab, stool RN will collect     Status: None   Collection Time: 12/27/14 10:20 AM  Result Value Ref Range   Fecal Occult Bld NEGATIVE NEGATIVE  Glucose, capillary     Status: Abnormal   Collection Time: 12/27/14 11:25 AM  Result Value Ref Range   Glucose-Capillary 150 (H) 65 - 99 mg/dL  Cortisol     Status: None   Collection Time: 12/27/14 11:35 AM  Result Value Ref Range   Cortisol, Plasma 11.9 ug/dL    Comment: (NOTE) AM    6.7 - 22.6 ug/dL PM   <10.0       ug/dL Performed at [facility]   CBC with Differential/Platelet     Status: Abnormal   Collection Time: 12/27/14 11:35 AM  Result Value Ref Range   WBC 4.7 3.8 - 10.6 K/uL   RBC 2.44 (L) 4.40 - 5.90 MIL/uL   Hemoglobin 6.9 (L) 13.0 - 18.0 g/dL   HCT 20.7 (L) 40.0 - 52.0 %   MCV 84.8 80.0 - 100.0 fL   MCH 28.1 26.0 - 34.0 pg   MCHC 33.2 32.0 - 36.0 g/dL   RDW 16.3 (H) 11.5 - 14.5 %   Platelets 89 (L) 150 - 440 K/uL   Neutrophils Relative % 80% %   Neutro Abs 3.8 1.4 - 6.5 K/uL   Lymphocytes Relative 8% %   Lymphs Abs 0.4 (L) 1.0 - 3.6 K/uL   Monocytes Relative 10% %   Monocytes Absolute 0.5 0.2 - 1.0 K/uL   Eosinophils Relative 1% %   Eosinophils Absolute 0.0 0 - 0.7 K/uL    Basophils Relative 1% %   Basophils Absolute 0.0 0 - 0.1 K/uL  Basic metabolic panel     Status: Abnormal   Collection Time: 12/27/14 11:35 AM  Result Value Ref Range   Sodium 140 135 - 145 mmol/L   Potassium 4.2 3.5 - 5.1 mmol/L   Chloride 105 101 - 111 mmol/L   CO2 25 22 - 32 mmol/L   Glucose, Bld 154 (H) 65 - 99 mg/dL   BUN 66 (H) 6 - 20 mg/dL   Creatinine, Ser 5.28 (H) 0.61 - 1.24 mg/dL   Calcium 7.9 (L) 8.9 - 10.3 mg/dL   GFR calc non Af Amer 10 (L) >60 mL/min   GFR calc Af Amer 12 (L) >60 mL/min    Comment: (NOTE) The eGFR has been calculated using the CKD EPI equation. This calculation has not been validated in all clinical situations. eGFR's persistently <60 mL/min signify possible Chronic Kidney Disease.    Anion gap 10 5 - 15  Type and screen [place]     Status: None   Collection Time: 12/27/14  1:31 PM  Result Value Ref Range   ABO/RH(D) A POS    Antibody Screen NEG    Sample Expiration 12/30/2014    Unit Number [id number]    Blood Component Type RED CELLS,LR    Unit division 00    Status of Unit ISSUED,FINAL    Transfusion Status OK TO TRANSFUSE    Crossmatch Result  Compatible   Prepare RBC     Status: None   Collection Time: 12/27/14  1:31 PM  Result Value Ref Range   Order Confirmation ORDER PROCESSED BY BLOOD BANK   Prepare RBC     Status: None   Collection Time: 12/27/14  1:31 PM  Result Value Ref Range   Order Confirmation DUPLICATE ORDER PER DAN NEWTON 12/27/14 1756 SJL   ABO/Rh     Status: None   Collection Time: 12/27/14  1:32 PM  Result Value Ref Range   ABO/RH(D) A POS   Glucose, capillary     Status: Abnormal   Collection Time: 12/27/14  3:37 PM  Result Value Ref Range   Glucose-Capillary 138 (H) 65 - 99 mg/dL  Glucose, capillary     Status: Abnormal   Collection Time: 12/27/14  8:48 PM  Result Value Ref Range   Glucose-Capillary 146 (H) 65 - 99 mg/dL  Basic metabolic panel     Status: Abnormal   Collection  Time: 12/28/14  4:45 AM  Result Value Ref Range   Sodium 139 135 - 145 mmol/L   Potassium 4.1 3.5 - 5.1 mmol/L   Chloride 105 101 - 111 mmol/L   CO2 24 22 - 32 mmol/L   Glucose, Bld 106 (H) 65 - 99 mg/dL   BUN 68 (H) 6 - 20 mg/dL   Creatinine, Ser 5.42 (H) 0.61 - 1.24 mg/dL   Calcium 8.1 (L) 8.9 - 10.3 mg/dL   GFR calc non Af Amer 10 (L) >60 mL/min   GFR calc Af Amer 11 (L) >60 mL/min    Comment: (NOTE) The eGFR has been calculated using the CKD EPI equation. This calculation has not been validated in all clinical situations. eGFR's persistently <60 mL/min signify possible Chronic Kidney Disease.    Anion gap 10 5 - 15  CBC     Status: Abnormal   Collection Time: 12/28/14  4:45 AM  Result Value Ref Range   WBC 5.1 3.8 - 10.6 K/uL   RBC 2.67 (L) 4.40 - 5.90 MIL/uL   Hemoglobin 7.7 (L) 13.0 - 18.0 g/dL   HCT 22.7 (L) 40.0 - 52.0 %   MCV 85.1 80.0 - 100.0 fL   MCH 28.9 26.0 - 34.0 pg   MCHC 34.0 32.0 - 36.0 g/dL   RDW 16.3 (H) 11.5 - 14.5 %   Platelets 81 (L) 150 - 440 K/uL  Glucose, capillary     Status: None   Collection Time: 12/28/14  7:08 AM  Result Value Ref Range   Glucose-Capillary 98 65 - 99 mg/dL  Glucose, capillary     Status: Abnormal   Collection Time: 12/28/14 11:38 AM  Result Value Ref Range   Glucose-Capillary 166 (H) 65 - 99 mg/dL    Ct Chest Wo Contrast  12/27/2014  CLINICAL DATA:  Shortness of breath. Prostate cancer. CABG. Former smoker. EXAM: CT CHEST WITHOUT CONTRAST TECHNIQUE: Multidetector CT imaging of the chest was performed following the standard protocol without IV contrast. COMPARISON:  03/04/2013 PET-CT.  12/23/2014 chest radiograph. FINDINGS: Images are motion degraded. Mediastinum/Nodes: Stable mild cardiomegaly. No pericardial fluid/thickening. Left main, left anterior descending, left circumflex and right coronary atherosclerosis, with ascending aortic and left internal mammary bypass grafts. Normal caliber thoracic aorta. Stable dilation of  the main pulmonary artery (3.4 cm diameter). Normal visualized thyroid. Normal esophagus. No axillary adenopathy. Mildly enlarged 1.1 cm right paratracheal node (series 2/ image 13), previously 0.9 cm . Mildly enlarged 1.1 cm subcarinal node (  2/26), increased from 0.9 cm. No gross hilar adenopathy on this noncontrast study. Lungs/Pleura: No pneumothorax. No pleural effusion. There is mild centrilobular emphysema. There are extensive patchy tree-in-bud opacities, patchy centrilobular nodular opacities and patchy peribronchovascular ground-glass opacity throughout both lungs, most prominent in the right upper, right middle and right lower lobes. Upper abdomen: Unremarkable. Musculoskeletal: No aggressive appearing focal osseous lesions. Median sternotomy wires are intact. Marked degenerative changes in the thoracic spine. IMPRESSION: 1. Extensive patchy tree-in-bud opacities, patchy centrilobular nodular opacities and patchy peribronchovascular ground-glass opacities throughout both lungs, asymmetrically involving the lobes of the right lung. These findings are most in keeping with an infectious bronchiolitis/developing bronchopneumonia, with the differential including bacterial, fungal or tuberculosis infections. 2. New mild mediastinal lymphadenopathy, nonspecific, likely reactive. 3. Mild centrilobular emphysema. 4. Left main and 3 vessel coronary atherosclerosis status post CABG. 5. Stable dilated main pulmonary artery, suggesting chronic pulmonary arterial hypertension. Electronically Signed   By: Joseph [name] M.D.   On: 12/27/2014 10:44   Dg Chest Port 1 View  12/27/2014  CLINICAL DATA:  Patient status post PICC line placement. Interval re- adjustment. EXAM: PORTABLE CHEST 1 VIEW COMPARISON:  Earlier same day FINDINGS: Interval re- adjustment PICC line with tip projecting over the superior vena cava. Stable cardiomegaly status post median sternotomy and CABG procedure. Unchanged heterogeneous opacities right  lung base most compatible with pneumonia. No pleural effusion or pneumothorax. IMPRESSION: Interval re- adjustment PICC line with tip projecting over the superior vena cava. Persistent heterogeneous opacities right lung base favored to represent pneumonia. Cardiomegaly. Electronically Signed   By: Lovey Newcomer M.D.   On: 12/27/2014 16:20   Dg Chest Port 1 View  12/27/2014  CLINICAL DATA:  Patient status post PICC line placement. EXAM: PORTABLE CHEST 1 VIEW COMPARISON:  Chest radiograph 12/23/2014. FINDINGS: Interval insertion of PICC line with tip projecting over the expected location of the central left brachiocephalic vein near the confluence with the superior vena cava. Stable cardiac and mediastinal contours, enlarged. Pulmonary vascular redistribution. No frank pulmonary edema. Re- demonstrated heterogeneous opacities within the right mid lower lung. No pleural effusion or pneumothorax. IMPRESSION: New PICC line tip projects at the confluence of the left brachiocephalic vein and superior vena cava. Consider repositioning. Cardiomegaly and pulmonary vascular redistribution. Re- demonstrated right mid and lower lung heterogeneous opacities most compatible with pneumonia. Electronically Signed   By: Lovey Newcomer M.D.   On: 12/27/2014 16:12    ROS Blood pressure 130/69, pulse 79, temperature 97.7 F (36.5 C), temperature source Oral, resp. rate 32, height 5' 10.5" (1.791 m), weight 90.5 kg (199 lb 8.3 oz), SpO2 96 %. Physical Exam Physical Examination: General appearance - tanned, NAD Mental status - alert, oriented to person, place, and time Eyes - pupils equal and reactive, extraocular eye movements intact Mouth - upper plate Neck - adenopathy noted PCL Lymphatics - posterior cervical nodes Chest - rales noted R>L base, rhonchi noted R>Lbase Heart - S1 and S2 normal, S4 present, systolic murmur Gr 2/6 at apex Abdomen - soft, pos bs, liver down ,  Musculoskeletal - no joint tenderness, deformity  or swelling Extremities - AVF RFA Skin - bruises, bronzed  Assessment/Plan: 1 CKD 5 suspect needs chronic HD but will give a chance and allow bp to rise to see if makes urine.  BP too low.  Needs anemia addressed, diet, meds 2 CAD per Cards.  Syncope worrisome, ? xs meds 3 Hypertension: not an issue 4. Anemia check Fe, and give ESA 5.  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

## 2014-12-28 NOTE — Progress Notes (Signed)
Patient resting well overnight, daughter at bedside overnight, no complaints of pain. See [facility] for further update.

## 2014-12-28 NOTE — Progress Notes (Signed)
ANTIBIOTIC CONSULT NOTE - INITIAL  Pharmacy Consult for Vancomycin and Zosyn Indication: pneumonia  Allergies  Allergen Reactions  . Sulfa Antibiotics Itching    hives  . Morphine And Related     Makes patient feel weird    Patient Measurements: Height: 5' 10.5" (179.1 cm) Weight: 199 lb 8.3 oz (90.5 kg) IBW/kg (Calculated) : 74.15  Vital Signs: Temp: 97.7 F (36.5 C) (11/21 1350) Temp Source: Oral (11/21 1350) BP: 130/69 mmHg (11/21 1345) Pulse Rate: 79 (11/21 1345)  Labs:  Recent Labs  12/26/14 1208 12/27/14 1135 12/28/14 0445  WBC 4.7 4.7 5.1  HGB 7.3* 6.9* 7.7*  PLT 73* 89* 81*  CREATININE 5.84* 5.28* 5.42*   Estimated Creatinine Clearance: 15.3 mL/min (by C-G formula based on Cr of 5.42).   Microbiology: Recent Results (from the past 720 hour(s))  Culture, blood (routine x 2) Call MD if unable to obtain prior to antibiotics being given     Status: None (Preliminary result)   Collection Time: 12/23/14  3:54 PM  Result Value Ref Range Status   Specimen Description BLOOD RIGHT ASSIST CONTROL  Final   Special Requests   Final    BOTTLES DRAWN AEROBIC AND ANAEROBIC [place] ANA   Culture NO GROWTH 4 DAYS  Final   Report Status PENDING  Incomplete  Culture, blood (routine x 2) Call MD if unable to obtain prior to antibiotics being given     Status: None (Preliminary result)   Collection Time: 12/23/14  5:24 PM  Result Value Ref Range Status   Specimen Description BLOOD RIGHT HAND  Final   Special Requests BOTTLES DRAWN AEROBIC AND ANAEROBIC 4CC  Final   Culture NO GROWTH 4 DAYS  Final   Report Status PENDING  Incomplete  Culture, sputum-assessment     Status: None   Collection Time: 12/24/14 12:38 PM  Result Value Ref Range Status   Specimen Description EXPECTORATED SPUTUM  Final   Special Requests NONE  Final   Sputum evaluation THIS SPECIMEN IS ACCEPTABLE FOR SPUTUM CULTURE  Final   Report Status 12/24/2014 FINAL  Final  Culture, respiratory  (NON-Expectorated)     Status: None   Collection Time: 12/24/14 12:38 PM  Result Value Ref Range Status   Specimen Description EXPECTORATED SPUTUM  Final   Special Requests NONE Reflexed from [id number]  Final   Gram Stain   Final    GOOD SPECIMEN - 80-90% WBCS MANY WBC SEEN MODERATE GRAM POSITIVE RODS FEW GRAM POSITIVE COCCI    Culture LIGHT GROWTH STAPHYLOCOCCUS AUREUS  Final   Report Status 12/28/2014 FINAL  Final   Organism ID, Bacteria STAPHYLOCOCCUS AUREUS  Final      Susceptibility   Staphylococcus aureus - MIC*    CIPROFLOXACIN <=0.5 SENSITIVE Sensitive     GENTAMICIN <=0.5 SENSITIVE Sensitive     OXACILLIN 0.5 SENSITIVE Sensitive     TRIMETH/SULFA <=10 SENSITIVE Sensitive     CEFOXITIN SCREEN NEGATIVE Sensitive     Inducible Clindamycin NEGATIVE Sensitive     ERYTHROMYCIN Value in next row Sensitive      SENSITIVE0.5    TETRACYCLINE Value in next row Sensitive      SENSITIVE<=1    CLINDAMYCIN Value in next row Sensitive      SENSITIVE<=0.25    LEVOFLOXACIN Value in next row Sensitive      SENSITIVE0.25    LINEZOLID Value in next row Sensitive      SENSITIVE2    * LIGHT GROWTH STAPHYLOCOCCUS AUREUS  MRSA PCR Screening     Status: None   Collection Time: 12/27/14  1:52 AM  Result Value Ref Range Status   MRSA by PCR NEGATIVE NEGATIVE Final    Comment:        The GeneXpert MRSA Assay (FDA approved for NASAL specimens only), is one component of a comprehensive MRSA colonization surveillance program. It is not intended to diagnose MRSA infection nor to guide or monitor treatment for MRSA infections.     Medical History: Past Medical History  Diagnosis Date  . Hypertension   . Hypercholesterolemia   . Prostate cancer ([place])   . Hepatosplenomegaly 2015    fibrosis, no cirrhosis on 06/2013 liver biopsy  . Thrombocytopenia ([place]) 2015  . Myocardial infarction ([place]) 2001  . Depression with anxiety   . IDDM (insulin dependent diabetes mellitus) ([place])     INSULIN  DEPENDENT  . GERD (gastroesophageal reflux disease)   . History of shingles   . Ischemic cardiomyopathy     a. echo 08/06/2014: EF 25-30%, multiple WMA, mod DD, PASP 49 mm Hg   . CKD (chronic kidney disease), stage IV (HCC)     a. previously on dialysis; b. followed by Dr. Abigail Butts   . Coronary artery disease, occlusive     a. s/p 4v CABG 2001(LIMA-LAD, SVG-D2, SVG-OM1, SVG-right PDA, SVG-right PL1); b. cath 07/30/2014 vein grafts all down, patent LIMA to LAD, significant LM and ost LCx dx; c. cath 08/07/2014 Synergy DES 2.5 mm x 18 mm to dist LM and ost LCx, rec lifelong DAPT  . Coronary artery disease involving coronary bypass graft 08/2014    all vein grafts occluded. Patent LIMA-LAD -- s/p PCI ot LM-Cx;   [name] Chronic combined systolic and diastolic CHF, NYHA class 2 ([place])     a. echo 08/2013: EF 40-45%, impaired relaxation, mild MR, LA mildly dilated,    . Gallstone pancreatitis 2015   Assessment:   Transferred from [facility] today.    Vanc and Zosyn begun at [facility] on 12/27/14.    Got Vanc 1 gm IV x 2 doses on 11/20 for total 2gm IV loading dose.  2nd dose given ~6pm.    Last Zosyn dose was at 10:30am today at [facility].    Had HD on 11/18 and 11/19. CKD 5, may need chronic HD.  Goal of Therapy:  Vancomycin trough level 15-20 mcg/ml, or 15-25 mcg/ml pre-dialysis. Appropriate Zosyn dose for renal function  Plan:    Zosyn 2.25 gm IV q8hrs.   Vancomycin 1 gram IV q48hrs - next 11/22 pm.   Will follow up renal status and adjust Vanc dose/timing if needed.   If he needs dialysis, will schedule vanc doses after dialysis.    Arty Baumgartner, [place] Pager: [phone] 12/28/2014,4:47 PM

## 2014-12-28 NOTE — Consult Note (Signed)
Cardiologist:  Joseph [name] Reason for Consult: CHF Referring Physician: Amilio [name] is an 69 y.o. male.  HPI:   Joseph [name] is a 69 y.o. male with PMH of CAD s/p CABG '01 at [facility] (LIMA-LAD, SVG-D2, SVG-OM1, SVG-right PDA, SVG-right PL1) along with multiple PCIs, ISM/chronic systolic CHF (EF 82-42% 3/53), stage IV CKD (has had dialysis previously), chronic thrombocytopenia, T2DM, prostate cancer. He had a cardiac catheterization by Dr. Humphrey [name] in [facility] 07/30/14 revealing all vein grafts occluded, (known dating to '09), patent LIMA to LAD, 70% ostial left main lesion, 70% ostial LCx, occluded RCA. He underwent PCI by Dr. Gwenlyn [name] 08/07/14 for critical left main and ostial LCx at 90% and had synergy DES to both. He had some chest pain on 7/20 at cardiac rehab (palpitations, PACs, PAT and bardycardia but no VT on 48 hor holter) leading to observation and negative cardiac markers, elevated d-dimer and VQ performed low risk for PE and ranexa restarted. He had some chest pain during other cardiac rehab and had follow-up 8/5 with uptitration of hydralazine and ranolazine.  He presented with left-sided chest pain and s/s volume overload On 11/03/2014. He was diuresed with IV Lasix. IV nitroglycerin was continued as well as aspirin, Coreg, Plavix, Crestor, ranolazine. Patient had an acute drop in hemoglobin reports 8.7 to 7.8 despite diuresing 1 L at the time. He was transfused 1 unit of PRBCs. GI consult at and he was heme-negative at the time of exam, however, he ended having Hemoccult-positive stool. Hemoglobin was trending up, so they recommended outpatient evaluation. The patient's troponin was trending up and peaked at 6.92. Is difficult to know if the patient had a primary NSTEMI or if it was demand ischemia. However, he did very well with cardiac rehabilitation and ambulated 2000 feet with no chest pain. 2-D echocardiogram revealed ejection fraction of 30-35% with moderate  diffuse hypokinesis. There was akinesis scarring of the inferior lateral and inferior myocardium. Left atrium was severely dilated. Right ventricle was moderately dilated. Right atrium was mildly dilated. He was also seen in consult by Dr. Caryl [name] with EP and was not considered a candidate for ICD implantation at this time but could reconsider in 3-6 months. He was also seen by nephrology worsening chronic kidney disease who thought he likely had a component of hypoperfusion related to his heart failure on top of his progressively worsening chronic kidney disease. Dr. Jimmy [name] recommended keeping his blood pressure slightly higher than had been. Bicarbonate was increased to 1300 mg twice a day. His Lasix was later increased to 120 mg twice daily which would be his discharge dosing and was reviewed with Dr. Justin [name]. Kayexalate was not resumed at discharge. Potassium has been on the low side during hospitalization. He was weaned off IV nitroglycerin and Imdur was changed to 60 mg twice a day.  Patient was hospitalized down in [place] and I saw him for follow-up on 11/2.  He reports developing about 4 out of 10 chest pain while he was down there. He was also short of breath and was given IV Lasix for diuresis. He was started on IV heparin and nitroglycerin. His chest pain resolved his troponin increased to 0.79 he underwent another echocardiogram which revealed ejection fraction proximal 40% left atrium was mildly dilated, mild mitral regurgitation mild tricuspid regurgitation, right ventricular systolic pressure was 61.4 mmHg. LV was mildly dilated. Hemoglobin was stable in [place] at 8.5. He reports today that he lifted a 50 pound bag of dog  food and developed dull chest pain with an intensity of 1/10. It quickly resolved. The patient was in [place] for vacation and during our discussion it sounds like he has not been particularly compliant with his diet.   He was now admitted with syncope, PNA,  hypotension, cough, congestion.   He had his first HD treatment on 11/18.  The patient reports that prior to his admission he had an episode where he felt really lightheaded and fell. At that moment he did not pass out, however, when he was walking to his car, he grabbed for the door latch and did have a syncopal episode that point.  He reports last week he was playing pickle ball was not having any issues with chest pain. He would play a game of onset about a game and then play game etc... A day and half ago while he was at [facility], he did have an episode of chest pain left axilla area which resolved with nitroglycerin. He's had no pain since.  His main issue is shortness of breath. He does not feel that he is volume overloaded.  His weight today is 199 pounds when I saw him in the office on November 2 was 206.  He appeared euvolemic at that time.  The patient currently denies nausea, vomiting, fever, orthopnea, PND, abdominal pain, hematochezia, melena, lower extremity edema, claudication.   Filed Weights   12/28/14 1345  Weight: 199 lb 8.3 oz (90.5 kg)      Past Medical History  Diagnosis Date  . Hypertension   . Hypercholesterolemia   . Prostate cancer ([place])   . Hepatosplenomegaly 2015    fibrosis, no cirrhosis on 06/2013 liver biopsy  . Thrombocytopenia ([place]) 2015  . Myocardial infarction ([place]) 2001  . Depression with anxiety   . IDDM (insulin dependent diabetes mellitus) ([place])     INSULIN DEPENDENT  . GERD (gastroesophageal reflux disease)   . History of shingles   . Ischemic cardiomyopathy     a. echo 08/06/2014: EF 25-30%, multiple WMA, mod DD, PASP 49 mm Hg   . CKD (chronic kidney disease), stage IV (HCC)     a. previously on dialysis; b. followed by Dr. Abigail Butts   . Coronary artery disease, occlusive     a. s/p 4v CABG 2001(LIMA-LAD, SVG-D2, SVG-OM1, SVG-right PDA, SVG-right PL1); b. cath 07/30/2014 vein grafts all down, patent LIMA to LAD, significant LM and ost LCx dx; c. cath  08/07/2014 Synergy DES 2.5 mm x 18 mm to dist LM and ost LCx, rec lifelong DAPT  . Coronary artery disease involving coronary bypass graft 08/2014    all vein grafts occluded. Patent LIMA-LAD -- s/p PCI ot LM-Cx;   [name] Chronic combined systolic and diastolic CHF, NYHA class 2 ([place])     a. echo 08/2013: EF 40-45%, impaired relaxation, mild MR, LA mildly dilated,    . Gallstone pancreatitis 2015    Past Surgical History  Procedure Laterality Date  . Cardiac surgery    . Shoulder arthroscopy    . Penile prosthesis implant    . Myocardial infarction    . Cardiac catheterization    . Coronary angioplasty    . Coronary artery bypass graft  2001  . Colonoscopy    . Liver biopsy    . Av fistula placement Left 2015  . Cardiac catheterization N/A 07/30/2014    Procedure: Left Heart Cath and Cors/Grafts Angiography;  Surgeon: Dionisio David, MD;  Location: [place] [facility];  Service: Cardiovascular;  Laterality: N/A;  . Cardiac catheterization N/A 08/07/2014    Procedure: Coronary Stent Intervention;  Surgeon: Lorretta Harp, MD;  Location: [place] [facility];  Service: Cardiovascular;  Laterality: N/A;    Family History  Problem Relation Age of Onset  . Acute myelogenous leukemia Brother   . CAD Brother   . Diabetes Mellitus II Brother   . Heart disease Father 20    CABG    Social History:  reports that he quit smoking about 6 years ago. He has quit using smokeless tobacco. He reports that he does not drink alcohol or use illicit drugs.  Allergies:  Allergies  Allergen Reactions  . Sulfa Antibiotics Itching    hives  . Morphine And Related     Makes patient feel weird    Medications:  Scheduled Meds: . [START ON 12/29/2014] calcitRIOL  0.25 mcg Oral Daily  . carvedilol  3.125 mg Oral BID  . hydrALAZINE  25 mg Oral BID  . insulin aspart  0-15 Units Subcutaneous TID WC  . insulin aspart  0-5 Units Subcutaneous QHS  . insulin glargine  10 Units Subcutaneous QHS  . [START ON  12/29/2014] isosorbide mononitrate  90 mg Oral Daily  . multivitamin  1 tablet Oral QHS  . piperacillin-tazobactam (ZOSYN)  IV  2.25 g Intravenous Q8H  . ranolazine  1,000 mg Oral BID  . rosuvastatin  20 mg Oral QHS  . sodium chloride  3 mL Intravenous Q12H  . sodium chloride  3 mL Intravenous Q12H   Continuous Infusions:  PRN Meds:.sodium chloride, acetaminophen **OR** acetaminophen, ALPRAZolam, nitroGLYCERIN, ondansetron **OR** ondansetron (ZOFRAN) IV, sodium chloride   Results for orders placed or performed during the hospital encounter of 12/23/14 (from the past 48 hour(s))  Glucose, capillary     Status: Abnormal   Collection Time: 12/26/14  9:05 PM  Result Value Ref Range   Glucose-Capillary 117 (H) 65 - 99 mg/dL   Comment 1 Notify RN   MRSA PCR Screening     Status: None   Collection Time: 12/27/14  1:52 AM  Result Value Ref Range   MRSA by PCR NEGATIVE NEGATIVE    Comment:        The GeneXpert MRSA Assay (FDA approved for NASAL specimens only), is one component of a comprehensive MRSA colonization surveillance program. It is not intended to diagnose MRSA infection nor to guide or monitor treatment for MRSA infections.   Glucose, capillary     Status: Abnormal   Collection Time: 12/27/14  7:21 AM  Result Value Ref Range   Glucose-Capillary 134 (H) 65 - 99 mg/dL  Occult blood card to lab, stool RN will collect     Status: None   Collection Time: 12/27/14 10:20 AM  Result Value Ref Range   Fecal Occult Bld NEGATIVE NEGATIVE  Glucose, capillary     Status: Abnormal   Collection Time: 12/27/14 11:25 AM  Result Value Ref Range   Glucose-Capillary 150 (H) 65 - 99 mg/dL  Cortisol     Status: None   Collection Time: 12/27/14 11:35 AM  Result Value Ref Range   Cortisol, Plasma 11.9 ug/dL    Comment: (NOTE) AM    6.7 - 22.6 ug/dL PM   <10.0       ug/dL Performed at [facility]   CBC with Differential/Platelet     Status: Abnormal   Collection Time: 12/27/14  11:35 AM  Result Value Ref Range   WBC 4.7 3.8 - 10.6  K/uL   RBC 2.44 (L) 4.40 - 5.90 MIL/uL   Hemoglobin 6.9 (L) 13.0 - 18.0 g/dL   HCT 20.7 (L) 40.0 - 52.0 %   MCV 84.8 80.0 - 100.0 fL   MCH 28.1 26.0 - 34.0 pg   MCHC 33.2 32.0 - 36.0 g/dL   RDW 16.3 (H) 11.5 - 14.5 %   Platelets 89 (L) 150 - 440 K/uL   Neutrophils Relative % 80% %   Neutro Abs 3.8 1.4 - 6.5 K/uL   Lymphocytes Relative 8% %   Lymphs Abs 0.4 (L) 1.0 - 3.6 K/uL   Monocytes Relative 10% %   Monocytes Absolute 0.5 0.2 - 1.0 K/uL   Eosinophils Relative 1% %   Eosinophils Absolute 0.0 0 - 0.7 K/uL   Basophils Relative 1% %   Basophils Absolute 0.0 0 - 0.1 K/uL  Basic metabolic panel     Status: Abnormal   Collection Time: 12/27/14 11:35 AM  Result Value Ref Range   Sodium 140 135 - 145 mmol/L   Potassium 4.2 3.5 - 5.1 mmol/L   Chloride 105 101 - 111 mmol/L   CO2 25 22 - 32 mmol/L   Glucose, Bld 154 (H) 65 - 99 mg/dL   BUN 66 (H) 6 - 20 mg/dL   Creatinine, Ser 5.28 (H) 0.61 - 1.24 mg/dL   Calcium 7.9 (L) 8.9 - 10.3 mg/dL   GFR calc non Af Amer 10 (L) >60 mL/min   GFR calc Af Amer 12 (L) >60 mL/min    Comment: (NOTE) The eGFR has been calculated using the CKD EPI equation. This calculation has not been validated in all clinical situations. eGFR's persistently <60 mL/min signify possible Chronic Kidney Disease.    Anion gap 10 5 - 15  Type and screen [place]     Status: None   Collection Time: 12/27/14  1:31 PM  Result Value Ref Range   ABO/RH(D) A POS    Antibody Screen NEG    Sample Expiration 12/30/2014    Unit Number [id number]    Blood Component Type RED CELLS,LR    Unit division 00    Status of Unit ISSUED,FINAL    Transfusion Status OK TO TRANSFUSE    Crossmatch Result Compatible   Prepare RBC     Status: None   Collection Time: 12/27/14  1:31 PM  Result Value Ref Range   Order Confirmation ORDER PROCESSED BY BLOOD BANK   Prepare RBC     Status: None   Collection  Time: 12/27/14  1:31 PM  Result Value Ref Range   Order Confirmation DUPLICATE ORDER PER DAN NEWTON 12/27/14 1756 SJ[facility]   ABO/Rh     Status: None   Collection Time: 12/27/14  1:32 PM  Result Value Ref Range   ABO/RH(D) A POS   Glucose, capillary     Status: Abnormal   Collection Time: 12/27/14  3:37 PM  Result Value Ref Range   Glucose-Capillary 138 (H) 65 - 99 mg/dL  Glucose, capillary     Status: Abnormal   Collection Time: 12/27/14  8:48 PM  Result Value Ref Range   Glucose-Capillary 146 (H) 65 - 99 mg/dL  Basic metabolic panel     Status: Abnormal   Collection Time: 12/28/14  4:45 AM  Result Value Ref Range   Sodium 139 135 - 145 mmol/L   Potassium 4.1 3.5 - 5.1 mmol/L   Chloride 105 101 - 111 mmol/L   CO2 24 22 - 32  mmol/L   Glucose, Bld 106 (H) 65 - 99 mg/dL   BUN 68 (H) 6 - 20 mg/dL   Creatinine, Ser 5.42 (H) 0.61 - 1.24 mg/dL   Calcium 8.1 (L) 8.9 - 10.3 mg/dL   GFR calc non Af Amer 10 (L) >60 mL/min   GFR calc Af Amer 11 (L) >60 mL/min    Comment: (NOTE) The eGFR has been calculated using the CKD EPI equation. This calculation has not been validated in all clinical situations. eGFR's persistently <60 mL/min signify possible Chronic Kidney Disease.    Anion gap 10 5 - 15  CBC     Status: Abnormal   Collection Time: 12/28/14  4:45 AM  Result Value Ref Range   WBC 5.1 3.8 - 10.6 K/uL   RBC 2.67 (L) 4.40 - 5.90 MIL/uL   Hemoglobin 7.7 (L) 13.0 - 18.0 g/dL   HCT 22.7 (L) 40.0 - 52.0 %   MCV 85.1 80.0 - 100.0 fL   MCH 28.9 26.0 - 34.0 pg   MCHC 34.0 32.0 - 36.0 g/dL   RDW 16.3 (H) 11.5 - 14.5 %   Platelets 81 (L) 150 - 440 K/uL  Glucose, capillary     Status: None   Collection Time: 12/28/14  7:08 AM  Result Value Ref Range   Glucose-Capillary 98 65 - 99 mg/dL  Glucose, capillary     Status: Abnormal   Collection Time: 12/28/14 11:38 AM  Result Value Ref Range   Glucose-Capillary 166 (H) 65 - 99 mg/dL    Ct Chest Wo Contrast  12/27/2014  CLINICAL DATA:   Shortness of breath. Prostate cancer. CABG. Former smoker. EXAM: CT CHEST WITHOUT CONTRAST TECHNIQUE: Multidetector CT imaging of the chest was performed following the standard protocol without IV contrast. COMPARISON:  03/04/2013 PET-CT.  12/23/2014 chest radiograph. FINDINGS: Images are motion degraded. Mediastinum/Nodes: Stable mild cardiomegaly. No pericardial fluid/thickening. Left main, left anterior descending, left circumflex and right coronary atherosclerosis, with ascending aortic and left internal mammary bypass grafts. Normal caliber thoracic aorta. Stable dilation of the main pulmonary artery (3.4 cm diameter). Normal visualized thyroid. Normal esophagus. No axillary adenopathy. Mildly enlarged 1.1 cm right paratracheal node (series 2/ image 13), previously 0.9 cm . Mildly enlarged 1.1 cm subcarinal node (2/26), increased from 0.9 cm. No gross hilar adenopathy on this noncontrast study. Lungs/Pleura: No pneumothorax. No pleural effusion. There is mild centrilobular emphysema. There are extensive patchy tree-in-bud opacities, patchy centrilobular nodular opacities and patchy peribronchovascular ground-glass opacity throughout both lungs, most prominent in the right upper, right middle and right lower lobes. Upper abdomen: Unremarkable. Musculoskeletal: No aggressive appearing focal osseous lesions. Median sternotomy wires are intact. Marked degenerative changes in the thoracic spine. IMPRESSION: 1. Extensive patchy tree-in-bud opacities, patchy centrilobular nodular opacities and patchy peribronchovascular ground-glass opacities throughout both lungs, asymmetrically involving the lobes of the right lung. These findings are most in keeping with an infectious bronchiolitis/developing bronchopneumonia, with the differential including bacterial, fungal or tuberculosis infections. 2. New mild mediastinal lymphadenopathy, nonspecific, likely reactive. 3. Mild centrilobular emphysema. 4. Left main and 3 vessel  coronary atherosclerosis status post CABG. 5. Stable dilated main pulmonary artery, suggesting chronic pulmonary arterial hypertension. Electronically Signed   By: Ilona Sorrel M.D.   On: 12/27/2014 10:44   Dg Chest Port 1 View  12/27/2014  CLINICAL DATA:  Patient status post PICC line placement. Interval re- adjustment. EXAM: PORTABLE CHEST 1 VIEW COMPARISON:  Earlier same day FINDINGS: Interval re- adjustment PICC line with tip projecting over  the superior vena cava. Stable cardiomegaly status post median sternotomy and CABG procedure. Unchanged heterogeneous opacities right lung base most compatible with pneumonia. No pleural effusion or pneumothorax. IMPRESSION: Interval re- adjustment PICC line with tip projecting over the superior vena cava. Persistent heterogeneous opacities right lung base favored to represent pneumonia. Cardiomegaly. Electronically Signed   By: Lovey Newcomer M.D.   On: 12/27/2014 16:20   Dg Chest Port 1 View  12/27/2014  CLINICAL DATA:  Patient status post PICC line placement. EXAM: PORTABLE CHEST 1 VIEW COMPARISON:  Chest radiograph 12/23/2014. FINDINGS: Interval insertion of PICC line with tip projecting over the expected location of the central left brachiocephalic vein near the confluence with the superior vena cava. Stable cardiac and mediastinal contours, enlarged. Pulmonary vascular redistribution. No frank pulmonary edema. Re- demonstrated heterogeneous opacities within the right mid lower lung. No pleural effusion or pneumothorax. IMPRESSION: New PICC line tip projects at the confluence of the left brachiocephalic vein and superior vena cava. Consider repositioning. Cardiomegaly and pulmonary vascular redistribution. Re- demonstrated right mid and lower lung heterogeneous opacities most compatible with pneumonia. Electronically Signed   By: Lovey Newcomer M.D.   On: 12/27/2014 16:12    Review of Systems  Constitutional: Negative for fever.  All other systems reviewed and  are negative.  Blood pressure 130/69, pulse 79, temperature 97.7 F (36.5 C), temperature source Oral, resp. rate 32, height 5' 10.5" (1.791 m), weight 199 lb 8.3 oz (90.5 kg), SpO2 96 %. Physical Exam  Nursing note and vitals reviewed. Well nourished, well developed, in no acute distress HEENT: Pupils are equal round react to light accommodation extraocular movements are intact.  Neck: no JVDNo cervical lymphadenopathy. Cardiac: Regular rate and rhythm without murmurs rubs or gallops. Lungs:  Diffuse crackles and rhonchi Abd: soft, nontender, positive bowel sounds all quadrants, no hepatosplenomegaly Ext: no lower extremity edema.  2+ radial and dorsalis pedis pulses. Skin: warm and dry Neuro:  Grossly normal  Post-Intervention Diagram 08/07/14          Assessment/Plan: Principal Problem:   RLL pneumonia Active Problems:   Type 2 diabetes mellitus with renal manifestations (HCC)   Anemia   Thrombocytopenia (HCC)   Ischemic cardiomyopathy   Essential hypertension   CAD s/p CABG 2001, prior POBA, PCI/DES July 2016   Chronic kidney disease   Orthostatic hypotension   Syncope   Joseph [name] is a 69 y.o. male with PMH of CAD s/p CABG '01 at [facility] (LIMA-LAD, SVG-D2, SVG-OM1, SVG-right PDA, SVG-right PL1) along with multiple PCIs, ISM/chronic systolic CHF (EF 76-19% 5/09), stage IV CKD (has had dialysis previously), chronic thrombocytopenia, T2DM, prostate cancer.  He had a cardiac catheterization by Dr. Humphrey [name] in [facility] 07/30/14 revealing all vein grafts occluded, (known dating to '09), patent LIMA to LAD, 70% ostial left main lesion, 70% ostial LCx, occluded RCA. He underwent PCI by Dr. Gwenlyn [name] 08/07/14 for critical left main and ostial LCx at 90% and had synergy DES to both.  He had another echocardiogram 12/24/1998 2016 revealing an EF of 30-35% with diffuse hypokinesis was mild mitral valve regurgitation. No real change from prior echo.    Patient was admitted with pneumonia,  syncope, hypertension and worsening anemia. He was given a unit of PRBCs and hemoglobin is now 7.7.  Troponin was mildly elevated at 0.11 which is a chronic issue given his chronic kidney disease.  Last week he was playing pickle ball without any episodes of chest pain. One episode relieved with nitroglycerin.  At this point I think the patient is euvolemic or even hypovolemic.  Troponin is minimally elevated in the setting of anemia and chronic kidney disease.  He is not having active angina. I recommend continuing with medical therapy. Continue to monitor volume with I's and O's and daily weights.  Is on appropriate heart failure medications with hydralazine twice daily, Coreg 3.125 twice daily.  As well as Imdur and ranolazine for CAD.  Restart Brilinta.  He just had a left main stent.    Tarri Fuller, [place] 12/28/2014, 3:14 PM   Patient seen, examined. Available data reviewed. Agree with findings, assessment, and plan as outlined by Tenny Craw, PA-C. Pt independently interviewed and examined. On exam he is alert, oriented, in NAD. Lungs CTA, heart RRR. No peripheral edema. Extensive chart review done. He has a complex cardiac history with recent PCI of a protected left main into the LCx. He is on ASA and brilinta. Will follow BP and back off on hydralazine if BP runs low. Otherwise would continue current antianginal Rx. Will follow with you.   Sherren Mocha, M.D. 12/28/2014 5:40 PM

## 2014-12-28 NOTE — Progress Notes (Signed)
[facility]  ROUNDING NOTE   Subjective:  Pt seen at bedside. BP is good.  c/o shortness of breath this AM Eating breakfast comfortably O2 sats 91 %    Objective:  Vital signs in last 24 hours:  Temp:  [98.1 F (36.7 C)-99.6 F (37.6 C)] 98.1 F (36.7 C) (11/21 0800) Pulse Rate:  [65-103] 93 (11/21 0800) Resp:  [17-40] 17 (11/21 0800) BP: (65-157)/(25-85) 157/75 mmHg (11/21 0800) SpO2:  [87 %-100 %] 92 % (11/21 0800)  Weight change:  Filed Weights   12/25/14 1649 12/26/14 1648 12/27/14 0534  Weight: 91.128 kg (200 lb 14.4 oz) 92.534 kg (204 lb) 87.952 kg (193 lb 14.4 oz)    Intake/Output: I/O last 3 completed shifts: In: 830.9 [P.O.:340; I.V.:30.9; Blood:360; IV Piggyback:100] Out: -    Intake/Output this shift:  Total I/O In: -  Out: 250 [Urine:250]  Physical Exam: General: NAD  Head: Normocephalic, atraumatic. Moist oral mucosal membranes  Eyes: Anicteric  Neck: Supple, trachea midline  Lungs:  Clear to auscultation normal effort  Heart: S1S2 no rubs  Abdomen:  Soft, nontender, BS present.   Extremities:  trace peripheral edema.  Neurologic: Nonfocal, moving all four extremities  Skin: No lesions  Access: LUE AVF with good bruit and thrill    Basic Metabolic Panel:  Recent Labs Lab 12/23/14 0955 12/23/14 1554 12/25/14 0756 12/26/14 1208 12/26/14 1319 12/27/14 1135 12/28/14 0445  NA 136  --  137 142  --  140 139  K 4.9  --  4.5 4.2  --  4.2 4.1  CL 98*  --  104 107  --  105 105  CO2 23  --  20* 23  --  25 24  GLUCOSE 170*  --  221* 167*  --  154* 106*  BUN 131*  --  115* 92*  --  66* 68*  CREATININE 8.66* 8.61* 7.51* 5.84*  --  5.28* 5.42*  CALCIUM 8.1*  --  7.5* 8.0*  --  7.9* 8.1*  PHOS  --   --   --  5.1* 3.1  --   --     Liver Function Tests:  Recent Labs Lab 12/26/14 1208  ALBUMIN 2.9*   No results for input(s): LIPASE, AMYLASE in the last 168 hours. No results for input(s): AMMONIA in the last 168  hours.  CBC:  Recent Labs Lab 12/23/14 1554 12/25/14 0439 12/26/14 1208 12/27/14 1135 12/28/14 0445  WBC 4.5 4.6 4.7 4.7 5.1  NEUTROABS  --   --   --  3.8  --   HGB 7.5* 7.5* 7.3* 6.9* 7.7*  HCT 22.5* 23.0* 21.6* 20.7* 22.7*  MCV 85.0 84.9 84.2 84.8 85.1  PLT 77* 70* 73* 89* 81*    Cardiac Enzymes:  Recent Labs Lab 12/23/14 0955 12/23/14 1554 12/23/14 2206 12/24/14 0340 12/25/14 0756  CKTOTAL  --   --   --  211  --   TROPONINI 0.11* 0.09* 0.08* 0.08* 0.06*    BNP: Invalid input(s): POCBNP  CBG:  Recent Labs Lab 12/27/14 0721 12/27/14 1125 12/27/14 1537 12/27/14 2048 12/28/14 0708  GLUCAP 134* 150* 138* 146* 56    Microbiology: Results for orders placed or performed during the hospital encounter of 12/23/14  Culture, blood (routine x 2) Call MD if unable to obtain prior to antibiotics being given     Status: None (Preliminary result)   Collection Time: 12/23/14  3:54 PM  Result Value Ref Range Status   Specimen Description BLOOD  RIGHT ASSIST CONTROL  Final   Special Requests   Final    BOTTLES DRAWN AEROBIC AND ANAEROBIC 8CC AERO 5CC ANA   Culture NO GROWTH 4 DAYS  Final   Report Status PENDING  Incomplete  Culture, blood (routine x 2) Call MD if unable to obtain prior to antibiotics being given     Status: None (Preliminary result)   Collection Time: 12/23/14  5:24 PM  Result Value Ref Range Status   Specimen Description BLOOD RIGHT HAND  Final   Special Requests BOTTLES DRAWN AEROBIC AND ANAEROBIC 4CC  Final   Culture NO GROWTH 4 DAYS  Final   Report Status PENDING  Incomplete  Culture, sputum-assessment     Status: None   Collection Time: 12/24/14 12:38 PM  Result Value Ref Range Status   Specimen Description EXPECTORATED SPUTUM  Final   Special Requests NONE  Final   Sputum evaluation THIS SPECIMEN IS ACCEPTABLE FOR SPUTUM CULTURE  Final   Report Status 12/24/2014 FINAL  Final  Culture, respiratory (NON-Expectorated)     Status: None    Collection Time: 12/24/14 12:38 PM  Result Value Ref Range Status   Specimen Description EXPECTORATED SPUTUM  Final   Special Requests NONE Reflexed from [id number]  Final   Gram Stain   Final    GOOD SPECIMEN - 80-90% WBCS MANY WBC SEEN MODERATE GRAM POSITIVE RODS FEW GRAM POSITIVE COCCI    Culture LIGHT GROWTH STAPHYLOCOCCUS AUREUS  Final   Report Status 12/28/2014 FINAL  Final   Organism ID, Bacteria STAPHYLOCOCCUS AUREUS  Final      Susceptibility   Staphylococcus aureus - MIC*    CIPROFLOXACIN <=0.5 SENSITIVE Sensitive     GENTAMICIN <=0.5 SENSITIVE Sensitive     OXACILLIN 0.5 SENSITIVE Sensitive     TRIMETH/SULFA <=10 SENSITIVE Sensitive     CEFOXITIN SCREEN NEGATIVE Sensitive     Inducible Clindamycin NEGATIVE Sensitive     ERYTHROMYCIN Value in next row Sensitive      SENSITIVE0.5    TETRACYCLINE Value in next row Sensitive      SENSITIVE<=1    CLINDAMYCIN Value in next row Sensitive      SENSITIVE<=0.25    LEVOFLOXACIN Value in next row Sensitive      SENSITIVE0.25    LINEZOLID Value in next row Sensitive      SENSITIVE2    * LIGHT GROWTH STAPHYLOCOCCUS AUREUS  MRSA PCR Screening     Status: None   Collection Time: 12/27/14  1:52 AM  Result Value Ref Range Status   MRSA by PCR NEGATIVE NEGATIVE Final    Comment:        The GeneXpert MRSA Assay (FDA approved for NASAL specimens only), is one component of a comprehensive MRSA colonization surveillance program. It is not intended to diagnose MRSA infection nor to guide or monitor treatment for MRSA infections.     Coagulation Studies: No results for input(s): LABPROT, INR in the last 72 hours.  Urinalysis: No results for input(s): COLORURINE, LABSPEC, PHURINE, GLUCOSEU, HGBUR, BILIRUBINUR, KETONESUR, PROTEINUR, UROBILINOGEN, NITRITE, LEUKOCYTESUR in the last 72 hours.  Invalid input(s): APPERANCEUR    Imaging: Ct Chest Wo Contrast  12/27/2014  CLINICAL DATA:  Shortness of breath. Prostate cancer. CABG.  Former smoker. EXAM: CT CHEST WITHOUT CONTRAST TECHNIQUE: Multidetector CT imaging of the chest was performed following the standard protocol without IV contrast. COMPARISON:  03/04/2013 PET-CT.  12/23/2014 chest radiograph. FINDINGS: Images are motion degraded. Mediastinum/Nodes: Stable mild cardiomegaly. No pericardial fluid/thickening. Left  main, left anterior descending, left circumflex and right coronary atherosclerosis, with ascending aortic and left internal mammary bypass grafts. Normal caliber thoracic aorta. Stable dilation of the main pulmonary artery (3.4 cm diameter). Normal visualized thyroid. Normal esophagus. No axillary adenopathy. Mildly enlarged 1.1 cm right paratracheal node (series 2/ image 13), previously 0.9 cm . Mildly enlarged 1.1 cm subcarinal node (2/26), increased from 0.9 cm. No gross hilar adenopathy on this noncontrast study. Lungs/Pleura: No pneumothorax. No pleural effusion. There is mild centrilobular emphysema. There are extensive patchy tree-in-bud opacities, patchy centrilobular nodular opacities and patchy peribronchovascular ground-glass opacity throughout both lungs, most prominent in the right upper, right middle and right lower lobes. Upper abdomen: Unremarkable. Musculoskeletal: No aggressive appearing focal osseous lesions. Median sternotomy wires are intact. Marked degenerative changes in the thoracic spine. IMPRESSION: 1. Extensive patchy tree-in-bud opacities, patchy centrilobular nodular opacities and patchy peribronchovascular ground-glass opacities throughout both lungs, asymmetrically involving the lobes of the right lung. These findings are most in keeping with an infectious bronchiolitis/developing bronchopneumonia, with the differential including bacterial, fungal or tuberculosis infections. 2. New mild mediastinal lymphadenopathy, nonspecific, likely reactive. 3. Mild centrilobular emphysema. 4. Left main and 3 vessel coronary atherosclerosis status post CABG.  5. Stable dilated main pulmonary artery, suggesting chronic pulmonary arterial hypertension. Electronically Signed   By: Ilona Sorrel M.D.   On: 12/27/2014 10:44   Dg Chest Port 1 View  12/27/2014  CLINICAL DATA:  Patient status post PICC line placement. Interval re- adjustment. EXAM: PORTABLE CHEST 1 VIEW COMPARISON:  Earlier same day FINDINGS: Interval re- adjustment PICC line with tip projecting over the superior vena cava. Stable cardiomegaly status post median sternotomy and CABG procedure. Unchanged heterogeneous opacities right lung base most compatible with pneumonia. No pleural effusion or pneumothorax. IMPRESSION: Interval re- adjustment PICC line with tip projecting over the superior vena cava. Persistent heterogeneous opacities right lung base favored to represent pneumonia. Cardiomegaly. Electronically Signed   By: Lovey Newcomer M.D.   On: 12/27/2014 16:20   Dg Chest Port 1 View  12/27/2014  CLINICAL DATA:  Patient status post PICC line placement. EXAM: PORTABLE CHEST 1 VIEW COMPARISON:  Chest radiograph 12/23/2014. FINDINGS: Interval insertion of PICC line with tip projecting over the expected location of the central left brachiocephalic vein near the confluence with the superior vena cava. Stable cardiac and mediastinal contours, enlarged. Pulmonary vascular redistribution. No frank pulmonary edema. Re- demonstrated heterogeneous opacities within the right mid lower lung. No pleural effusion or pneumothorax. IMPRESSION: New PICC line tip projects at the confluence of the left brachiocephalic vein and superior vena cava. Consider repositioning. Cardiomegaly and pulmonary vascular redistribution. Re- demonstrated right mid and lower lung heterogeneous opacities most compatible with pneumonia. Electronically Signed   By: Lovey Newcomer M.D.   On: 12/27/2014 16:12     Medications:   . norepinephrine Stopped (12/27/14 1900)   . aspirin EC  81 mg Oral Daily  . bacitracin   Topical BID  .  budesonide-formoterol  2 puff Inhalation BID  . calcitRIOL  0.25 mcg Oral Daily  . [START ON 12/29/2014] epoetin (EPOGEN/PROCRIT) injection  10,000 Units Intravenous Q T,Th,Sa-HD  . ferrous sulfate  325 mg Oral Q breakfast  . guaiFENesin  600 mg Oral BID  . insulin aspart  0-9 Units Subcutaneous TID WC  . insulin glargine  20 Units Subcutaneous QHS  . piperacillin-tazobactam (ZOSYN)  IV  3.375 g Intravenous Q12H  . pregabalin  75 mg Oral BID  . rosuvastatin  20 mg  Oral QHS  . sodium chloride  3 mL Intravenous Q12H  . ticagrelor  90 mg Oral BID   acetaminophen, ALPRAZolam, diphenhydrAMINE, ipratropium-albuterol, nitroGLYCERIN, ondansetron (ZOFRAN) IV, sodium chloride, zolpidem  Assessment/ Plan:  69 y.o. male  with coronary artery disease status post CABG, diabetes mellitus type 2, hypertension, prostate cancer, hyperlipidemia presents as a follow up patient for chronic kidney disease stage V/ESRD, anemia of CKD, SHPTH  1.  End-stage renal disease.  Over the past several months the patient has had progressive renal dysfunction.  Was previously on dialysis.  EGFR remains quite low at 7 upon presentation. -Pt has had 2 dialysis treatments,   Pt was on dialysis previously at [place] and requests to go back there upon discharge.  His LUE AVF appears to be working well at the moment.   HD tomorrow D/c planning   2.  Anemia chronic kidney disease.  Hgb currently 7.7, to be transfused 1 unit PRBC.  3.  Secondary hyperparathyroidism.  Phos 3.1 at last check and acceptable.    4.  Metabolic acidosis:  Sodium bicarb tablets stopped.      LOS: 5 Joseph [name] 11/21/20168:51 AM

## 2014-12-28 NOTE — Telephone Encounter (Signed)
[facility] Plan of Care Note  Patient: KUNJ SAFRIT    [id number]  PCP: Marden Noble, MD    DOB: 07-Dec-1945  DOS: 12/28/2014   Received a phone call from Facility: [facility] regarding transfer of Mr. Eduardo Osier. Requesting: Dr. Dustin Flock Reason for transfer: family request History: pt admitted there for CAP and has been started on HD and was hypotensive and in ICU and now stable to be transfer to telemetry, family requested transfer to [facility] since patient's primary care has been handle in [place] hospital. CAP  Tree in bud appearance on CT critical care in [facility] considers this as a finding related to CAP and does not feel the pt has TB CAD PCI 2016, NSTEMI bicytopenia with anemia and thrombocytopenia.  Plan of care: The patient will be accepted for admission to [facility], telemetry unit. Will need cardiology due to recent PCI and thrombocytopenia, ID for CT findings and hematology for bicytopenia on arrival.  Author: Berle Mull, MD [facility] Pager: [phone] 12/28/2014 12:45 PM   If 7PM-7AM, please contact night-coverage at [email or web address], password [facility]

## 2014-12-28 NOTE — H&P (Signed)
[facility] History and Physical  Joseph [name] [id number] DOB: May 23, 1945 DOA: 12/28/2014  Referring physician: Jeannett Senior ([place]) PCP: Marden Noble, MD   Chief Complaint: syncope, CAP, hypotension, ESRD  HPI: Joseph [name] is a very pleasant 69 y.o. male with a past medical history including chronic systolic CHF, chronic kidney disease stage IV previously on dialysis but not so for the last year, diabetes insulin-dependent, CAD and MI chronic thrombocytopenia presents to room 2 on 3 S. from [place] with the chief complaint of worsening CHF, CAP end-stage renal disease.  Information is obtained from the patient and the chart. Patient presented [place] approximately 1 week ago after syncopal episode and found to be hypotensive, short of breath with productive cough likely pneumonia. As well as hypotension was related to the pneumonia. In the course of his hospitalization BUN/creatinine increased nephrology was consulted and they recommended hemodialysis during the hospitalization. Tolerated dialysis with borderline blood pressures. On November 20 he had a repeat episode of hypotension and was transferred to the intensive care unit. Levophed drip was initiated. He also received 1 unit of packed RBCs for anemia. CT of the chest without contrast showed bilateral pulmonary nodules. His oral Levaquin was then switched to IV vancomycin and Zosyn. Raquel Sarna requested he be transferred to [facility].  On admission patient denies any chest pain palpitation headache dizziness syncope or near-syncope. He denies any abdominal pain nausea vomiting diarrhea constipation melena. He does report a waxing and waning appetite. He denies any shortness of breath but endorses a moist productive cough.  On admission he is afebrile hemodynamically stable with a blood pressure 130/69 oxygen saturation level is 96% on 2 L.  Review of Systems:  10 point review of systems complete and all systems are negative  except as indicated in the history of present illness  Past Medical History  Diagnosis Date  . Hypertension   . Hypercholesterolemia   . Prostate cancer ([place])   . Hepatosplenomegaly 2015    fibrosis, no cirrhosis on 06/2013 liver biopsy  . Thrombocytopenia ([place]) 2015  . Myocardial infarction ([place]) 2001  . Depression with anxiety   . IDDM (insulin dependent diabetes mellitus) ([place])     INSULIN DEPENDENT  . GERD (gastroesophageal reflux disease)   . History of shingles   . Ischemic cardiomyopathy     a. echo 08/06/2014: EF 25-30%, multiple WMA, mod DD, PASP 49 mm Hg   . CKD (chronic kidney disease), stage IV (HCC)     a. previously on dialysis; b. followed by Dr. Abigail Butts   . Coronary artery disease, occlusive     a. s/p 4v CABG 2001(LIMA-LAD, SVG-D2, SVG-OM1, SVG-right PDA, SVG-right PL1); b. cath 07/30/2014 vein grafts all down, patent LIMA to LAD, significant LM and ost LCx dx; c. cath 08/07/2014 Synergy DES 2.5 mm x 18 mm to dist LM and ost LCx, rec lifelong DAPT  . Coronary artery disease involving coronary bypass graft 08/2014    all vein grafts occluded. Patent LIMA-LAD -- s/p PCI ot LM-Cx;   [name] Chronic combined systolic and diastolic CHF, NYHA class 2 ([place])     a. echo 08/2013: EF 40-45%, impaired relaxation, mild MR, LA mildly dilated,    . Gallstone pancreatitis 2015   Past Surgical History  Procedure Laterality Date  . Cardiac surgery    . Shoulder arthroscopy    . Penile prosthesis implant    . Myocardial infarction    . Cardiac catheterization    . Coronary angioplasty    .  Coronary artery bypass graft  2001  . Colonoscopy    . Liver biopsy    . Av fistula placement Left 2015  . Cardiac catheterization N/A 07/30/2014    Procedure: Left Heart Cath and Cors/Grafts Angiography;  Surgeon: Dionisio David, MD;  Location: [place];  Service: Cardiovascular;  Laterality: N/A;  . Cardiac catheterization N/A 08/07/2014    Procedure: Coronary Stent Intervention;  Surgeon:  Lorretta Harp, MD;  Location: [place] [facility];  Service: Cardiovascular;  Laterality: N/A;   Social History:  reports that he quit smoking about 6 years ago. He has quit using smokeless tobacco. He reports that he does not drink alcohol or use illicit drugs.  Allergies  Allergen Reactions  . Sulfa Antibiotics Itching    hives  . Morphine And Related     Makes patient feel weird    Family History  Problem Relation Age of Onset  . Acute myelogenous leukemia Brother   . CAD Brother   . Diabetes Mellitus II Brother   . Heart disease Father 58    CABG     Prior to Admission medications   Medication Sig Start Date End Date Taking? Authorizing Provider  ALPRAZolam Duanne Moron) 0.5 MG tablet Take 0.5 mg by mouth as needed for anxiety.    Historical Provider, MD  aspirin EC 81 MG tablet Take 81 mg by mouth daily.    Historical Provider, MD  calcitRIOL (ROCALTROL) 0.25 MCG capsule Take 0.25 mcg by mouth daily.     Historical Provider, MD  carvedilol (COREG) 6.25 MG tablet Take 1.5 tablets (9.375 mg total) by mouth 2 (two) times daily. 12/09/14   Brett Canales, PA-C  ferrous sulfate 325 (65 FE) MG EC tablet Take 325 mg by mouth daily with breakfast.     Historical Provider, MD  furosemide (LASIX) 40 MG tablet Take 120 mg by mouth 2 (two) times daily.     Historical Provider, MD  hydrALAZINE (APRESOLINE) 50 MG tablet Take 1 tablet (50 mg total) by mouth 2 (two) times daily. 11/08/14   Brett Canales, PA-C  insulin glargine (LANTUS) 100 UNIT/ML injection Inject 20 Units into the skin at bedtime.    Historical Provider, MD  isosorbide mononitrate (IMDUR) 60 MG 24 hr tablet Take 1.5 tablets (90 mg total) by mouth daily. 12/09/14   Brett Canales, PA-C  nitroGLYCERIN (NITROLINGUAL) 0.4 MG/SPRAY spray Place 1 spray under the tongue every 5 (five) minutes x 3 doses as needed for chest pain. Do not use together with sublingual nitro 08/08/14   Almyra Deforest, PA  pregabalin (LYRICA) 75 MG capsule Take 75 mg by  mouth 2 (two) times daily.    Historical Provider, MD  ranolazine (RANEXA) 1000 MG SR tablet Take 1 tablet (1,000 mg total) by mouth 2 (two) times daily. 09/11/14   Areta Haber Dunn, PA-C  rosuvastatin (CRESTOR) 20 MG tablet Take 20 mg by mouth at bedtime.     Historical Provider, MD  sodium bicarbonate 650 MG tablet Take 2 tablets (1,300 mg total) by mouth 2 (two) times daily. 11/08/14   Brett Canales, PA-C  ticagrelor (BRILINTA) 90 MG TABS tablet Take 1 tablet (90 mg total) by mouth 2 (two) times daily. 12/11/14   Brett Canales, PA-C   Physical Exam: Filed Vitals:   12/28/14 1345 12/28/14 1350  BP: 130/69   Pulse: 79   Temp:  97.7 F (36.5 C)  TempSrc:  Oral  Resp: 32  Height: 5' 10.5" (1.791 m)   Weight: 90.5 kg (199 lb 8.3 oz)   SpO2: 96%     Wt Readings from Last 3 Encounters:  12/28/14 90.5 kg (199 lb 8.3 oz)  12/27/14 87.952 kg (193 lb 14.4 oz)  12/09/14 93.622 kg (206 lb 6.4 oz)    General:  Appears calm and comfortable somewhat chronically ill Eyes: PERRL, normal lids, irises & conjunctiva ENT: grossly normal hearing, lips & tongue mucous membranes of his mouth are slightly pale and dry Neck: no LAD, masses or thyromegaly Cardiovascular: RRR, no m/r/g. No LE edema. Pedal pulses present and palpable Respiratory: Mild increased work of breathing with conversation. Breath sounds are diminished on the left with expiratory rhonchi and wheeze right side lobes with coarse rhonchi no crackles Abdomen: soft, ntnd positive bowel sounds no guarding Skin: no rash or induration seen on limited exam Musculoskeletal: grossly normal tone BUE/BLE Psychiatric: grossly normal mood and affect, speech fluent and appropriate Neurologic: grossly non-focal. speech clear           Labs on Admission:  Basic Metabolic Panel:  Recent Labs Lab 12/23/14 0955 12/23/14 1554 12/25/14 0756 12/26/14 1208 12/26/14 1319 12/27/14 1135 12/28/14 0445  NA 136  --  137 142  --  140 139  K 4.9  --  4.5  4.2  --  4.2 4.1  CL 98*  --  104 107  --  105 105  CO2 23  --  20* 23  --  25 24  GLUCOSE 170*  --  221* 167*  --  154* 106*  BUN 131*  --  115* 92*  --  66* 68*  CREATININE 8.66* 8.61* 7.51* 5.84*  --  5.28* 5.42*  CALCIUM 8.1*  --  7.5* 8.0*  --  7.9* 8.1*  PHOS  --   --   --  5.1* 3.1  --   --    Liver Function Tests:  Recent Labs Lab 12/26/14 1208  ALBUMIN 2.9*   No results for input(s): LIPASE, AMYLASE in the last 168 hours. No results for input(s): AMMONIA in the last 168 hours. CBC:  Recent Labs Lab 12/23/14 1554 12/25/14 0439 12/26/14 1208 12/27/14 1135 12/28/14 0445  WBC 4.5 4.6 4.7 4.7 5.1  NEUTROABS  --   --   --  3.8  --   HGB 7.5* 7.5* 7.3* 6.9* 7.7*  HCT 22.5* 23.0* 21.6* 20.7* 22.7*  MCV 85.0 84.9 84.2 84.8 85.1  PLT 77* 70* 73* 89* 81*   Cardiac Enzymes:  Recent Labs Lab 12/23/14 0955 12/23/14 1554 12/23/14 2206 12/24/14 0340 12/25/14 0756  CKTOTAL  --   --   --  211  --   TROPONINI 0.11* 0.09* 0.08* 0.08* 0.06*    BNP (last 3 results)  Recent Labs  08/05/14 2113 11/04/14 0120  BNP 1301.3* 2287.4*    ProBNP (last 3 results) No results for input(s): PROBNP in the last 8760 hours.  CBG:  Recent Labs Lab 12/27/14 1125 12/27/14 1537 12/27/14 2048 12/28/14 0708 12/28/14 1138  GLUCAP 150* 138* 146* 98 166*    Radiological Exams on Admission: Ct Chest Wo Contrast  12/27/2014  CLINICAL DATA:  Shortness of breath. Prostate cancer. CABG. Former smoker. EXAM: CT CHEST WITHOUT CONTRAST TECHNIQUE: Multidetector CT imaging of the chest was performed following the standard protocol without IV contrast. COMPARISON:  03/04/2013 PET-CT.  12/23/2014 chest radiograph. FINDINGS: Images are motion degraded. Mediastinum/Nodes: Stable mild cardiomegaly. No pericardial fluid/thickening. Left main, left anterior descending,  left circumflex and right coronary atherosclerosis, with ascending aortic and left internal mammary bypass grafts. Normal caliber  thoracic aorta. Stable dilation of the main pulmonary artery (3.4 cm diameter). Normal visualized thyroid. Normal esophagus. No axillary adenopathy. Mildly enlarged 1.1 cm right paratracheal node (series 2/ image 13), previously 0.9 cm . Mildly enlarged 1.1 cm subcarinal node (2/26), increased from 0.9 cm. No gross hilar adenopathy on this noncontrast study. Lungs/Pleura: No pneumothorax. No pleural effusion. There is mild centrilobular emphysema. There are extensive patchy tree-in-bud opacities, patchy centrilobular nodular opacities and patchy peribronchovascular ground-glass opacity throughout both lungs, most prominent in the right upper, right middle and right lower lobes. Upper abdomen: Unremarkable. Musculoskeletal: No aggressive appearing focal osseous lesions. Median sternotomy wires are intact. Marked degenerative changes in the thoracic spine. IMPRESSION: 1. Extensive patchy tree-in-bud opacities, patchy centrilobular nodular opacities and patchy peribronchovascular ground-glass opacities throughout both lungs, asymmetrically involving the lobes of the right lung. These findings are most in keeping with an infectious bronchiolitis/developing bronchopneumonia, with the differential including bacterial, fungal or tuberculosis infections. 2. New mild mediastinal lymphadenopathy, nonspecific, likely reactive. 3. Mild centrilobular emphysema. 4. Left main and 3 vessel coronary atherosclerosis status post CABG. 5. Stable dilated main pulmonary artery, suggesting chronic pulmonary arterial hypertension. Electronically Signed   By: Ilona Sorrel M.D.   On: 12/27/2014 10:44   Dg Chest Port 1 View  12/27/2014  CLINICAL DATA:  Patient status post PICC line placement. Interval re- adjustment. EXAM: PORTABLE CHEST 1 VIEW COMPARISON:  Earlier same day FINDINGS: Interval re- adjustment PICC line with tip projecting over the superior vena cava. Stable cardiomegaly status post median sternotomy and CABG procedure.  Unchanged heterogeneous opacities right lung base most compatible with pneumonia. No pleural effusion or pneumothorax. IMPRESSION: Interval re- adjustment PICC line with tip projecting over the superior vena cava. Persistent heterogeneous opacities right lung base favored to represent pneumonia. Cardiomegaly. Electronically Signed   By: Lovey Newcomer M.D.   On: 12/27/2014 16:20   Dg Chest Port 1 View  12/27/2014  CLINICAL DATA:  Patient status post PICC line placement. EXAM: PORTABLE CHEST 1 VIEW COMPARISON:  Chest radiograph 12/23/2014. FINDINGS: Interval insertion of PICC line with tip projecting over the expected location of the central left brachiocephalic vein near the confluence with the superior vena cava. Stable cardiac and mediastinal contours, enlarged. Pulmonary vascular redistribution. No frank pulmonary edema. Re- demonstrated heterogeneous opacities within the right mid lower lung. No pleural effusion or pneumothorax. IMPRESSION: New PICC line tip projects at the confluence of the left brachiocephalic vein and superior vena cava. Consider repositioning. Cardiomegaly and pulmonary vascular redistribution. Re- demonstrated right mid and lower lung heterogeneous opacities most compatible with pneumonia. Electronically Signed   By: Lovey Newcomer M.D.   On: 12/27/2014 16:12    EKG:   Assessment/Plan Principal Problem:   RLL pneumonia Active Problems:   Type 2 diabetes mellitus with renal manifestations (HCC)   Anemia   Thrombocytopenia (HCC)   Ischemic cardiomyopathy   Essential hypertension   CAD s/p CABG 2001, prior POBA, PCI/DES July 2016   Chronic kidney disease   Orthostatic hypotension   Syncope  #1. CAP/right lower lobe pneumonia. Continue Vanco and Zosyn initiated [place]. Will request speech therapy for evaluation rule out aspiration. He remained afebrile nontoxic appearing. Oxygen support as needed. Wean as able. Blood cultures if he spikes a temp greater than 101. Pneumo urine  antigen and Legionella pneumo urine antigen done 4 days ago negative.  #2. Chronic kidney disease  stage IV. Patient was a former dialysis patient has been off of dialysis for one year. Has had 2 dialysis treatments during this hospitalization. Have requested nephrology consult. Evaluated by nephrology at [facility] he's had progressive renal dysfunction over the past several months an acute illness led to dialysis. Of note is documented his left upper extremity aVF appears to be working well. Recommended dialysis  #3. thrombocytopenia/anemia. Chronic. Chart review indicates current platelet level at baseline. No signs symptoms of obvious bleeding. Current hemoglobin close to baseline. Nephrology consult. Cardiology consult for recommendations around Brilinta  #4. ischemic cardiomyopathy. Echo done 4 days ago revealing an EF of 30-35% with diffuse hypokinesis. Currently appears compensated. Home meds include Coreg, Lasix, Imdur. Will continue these. Daily weights monitoring of intake and output. Cardiology consult requested  #5. Essential hypertension. Currently blood pressures controlled. See history of present illness for details around recent episodes of hypotension. Continue home medications with parameters.  #6. Orthostatic hypotension/syncope. Syncopal episode reason for initial admission. Likely related to above. Will monitor vitals.  #7. Diabetes type 2. Appears controlled. He is on Lantus and sliding scale. Will continue Lantus at half the dose as his appetite is unreliable. Use sliding scale insulin for optimal control   Deterding Cardmaster  Code Status: full DVT Prophylaxis: Family Communication: none present Disposition Plan: home hopefully 2-3 days  Time spent: 63 minutes  [facility]

## 2014-12-28 NOTE — Progress Notes (Signed)
Pt tx to [facility] via [facility], pt check with nursing staff to ensure all belongs with person on tx, pt handed personal phone in pt hands, pt's RN stated called report, pt VSS for tx, all questions answered

## 2014-12-29 DIAGNOSIS — R55 Syncope and collapse: Secondary | ICD-10-CM

## 2014-12-29 DIAGNOSIS — N185 Chronic kidney disease, stage 5: Secondary | ICD-10-CM

## 2014-12-29 DIAGNOSIS — I25119 Atherosclerotic heart disease of native coronary artery with unspecified angina pectoris: Secondary | ICD-10-CM

## 2014-12-29 DIAGNOSIS — D696 Thrombocytopenia, unspecified: Secondary | ICD-10-CM

## 2014-12-29 LAB — BASIC METABOLIC PANEL
Anion gap: 9 (ref 5–15)
BUN: 72 mg/dL — AB (ref 6–20)
CALCIUM: 8.1 mg/dL — AB (ref 8.9–10.3)
CO2: 24 mmol/L (ref 22–32)
CREATININE: 6.26 mg/dL — AB (ref 0.61–1.24)
Chloride: 107 mmol/L (ref 101–111)
GFR calc Af Amer: 10 mL/min — ABNORMAL LOW (ref >60)
GFR calc non Af Amer: 8 mL/min — ABNORMAL LOW (ref >60)
GLUCOSE: 97 mg/dL (ref 65–99)
Potassium: 3.9 mmol/L (ref 3.5–5.1)
Sodium: 140 mmol/L (ref 135–145)

## 2014-12-29 LAB — CBC
HCT: 22.4 % — ABNORMAL LOW (ref 39.0–52.0)
HEMOGLOBIN: 7.3 g/dL — AB (ref 13.0–17.0)
MCH: 28.2 pg (ref 26.0–34.0)
MCHC: 32.6 g/dL (ref 30.0–36.0)
MCV: 86.5 fL (ref 78.0–100.0)
PLATELETS: 90 10*3/uL — AB (ref 150–400)
RBC: 2.59 MIL/uL — ABNORMAL LOW (ref 4.22–5.81)
RDW: 15.8 % — ABNORMAL HIGH (ref 11.5–15.5)
WBC: 4.6 10*3/uL (ref 4.0–10.5)

## 2014-12-29 LAB — GLUCOSE, CAPILLARY
GLUCOSE-CAPILLARY: 108 mg/dL — AB (ref 65–99)
Glucose-Capillary: 177 mg/dL — ABNORMAL HIGH (ref 65–99)
Glucose-Capillary: 132 mg/dL — ABNORMAL HIGH (ref 65–99)
Glucose-Capillary: 173 mg/dL — ABNORMAL HIGH (ref 65–99)

## 2014-12-29 LAB — C DIFFICILE QUICK SCREEN W PCR REFLEX
C DIFFICILE (CDIFF) TOXIN: NEGATIVE
C Diff antigen: NEGATIVE
C Diff interpretation: NEGATIVE

## 2014-12-29 MED ORDER — PNEUMOCOCCAL VAC POLYVALENT 25 MCG/0.5ML IJ INJ: 0.5000 mL | INJECTION | INTRAMUSCULAR | Status: DC | PRN

## 2014-12-29 MED ORDER — SODIUM CHLORIDE 0.9 % IV SOLN
125.0000 mg | Freq: Every day | INTRAVENOUS | Status: DC
  Administered 2014-12-29 – 2015-01-04 (×5): 125 mg via INTRAVENOUS
  Filled 2014-12-29 (×12): qty 10

## 2014-12-29 MED ORDER — VANCOMYCIN HCL IN DEXTROSE 1-5 GM/200ML-% IV SOLN
1000.0000 mg | Freq: Once | INTRAVENOUS | Status: DC
  Filled 2014-12-29: qty 200

## 2014-12-29 MED ORDER — VANCOMYCIN HCL IN DEXTROSE 1-5 GM/200ML-% IV SOLN
1000.0000 mg | Freq: Once | INTRAVENOUS | Status: AC
  Administered 2014-12-29: 1000 mg via INTRAVENOUS
  Filled 2014-12-29: qty 200

## 2014-12-29 NOTE — Progress Notes (Signed)
Initial Nutrition Assessment  DOCUMENTATION CODES:   Not applicable  INTERVENTION:    Nepro Shake po BID, each supplement provides 425 kcal and 19 grams protein  NUTRITION DIAGNOSIS:   Increased nutrient needs related to chronic illness as evidenced by estimated needs.  GOAL:   Patient will meet greater than or equal to 90% of their needs  MONITOR:   PO intake, Supplement acceptance, Labs, Weight trends, I & O's  REASON FOR ASSESSMENT:   Malnutrition Screening Tool    ASSESSMENT:   69 y.o. male with a past medical history including chronic systolic CHF, chronic kidney disease stage IV previously on dialysis but not so for the last year, diabetes insulin-dependent, CAD and MI chronic thrombocytopenia presents to room 2 on 3 S. from [place] with the chief complaint of worsening CHF, CAP end-stage renal disease  Unable to speak with patient or perform nutrition focused physical exam at this time. Patient with a significant amount of weight loss (5% over the past month). He is now going to require HD per Nephrology notes. Patient is consuming ~25% of meals.   Diet Order:  Diet renal/carb modified with fluid restriction Diet-HS Snack?: Nothing; Room service appropriate?: Yes; Fluid consistency:: Thin  Skin:  Reviewed, no issues  Last BM:  11/20 (rectal tube)  Height:   Ht Readings from Last 1 Encounters:  12/28/14 5' 10.5" (1.791 m)    Weight:   Wt Readings from Last 1 Encounters:  12/29/14 195 lb 12.3 oz (88.8 kg)    Ideal Body Weight:  76.8 kg  BMI:  Body mass index is 27.68 kg/(m^2).  Estimated Nutritional Needs:   Kcal:  [id number]  Protein:  105-125 gm  Fluid:  1.2 L or per MD  EDUCATION NEEDS:   Education needs no appropriate at this time  Molli Barrows, [place], [place], [place] Pager [phone] After Hours Pager [phone]

## 2014-12-29 NOTE — Progress Notes (Signed)
Patient Name: Joseph [name] Date of Encounter: 12/29/2014     Principal Problem:   RLL pneumonia Active Problems:   Type 2 diabetes mellitus with renal manifestations (HCC)   Anemia   Thrombocytopenia ([place])   Ischemic cardiomyopathy   Essential hypertension   CAD s/p CABG 2001, prior POBA, PCI/DES July 2016   Chronic kidney disease   Orthostatic hypotension   Syncope    SUBJECTIVE  Feeling ok this morning. Denies any significant SOB, still has a little cough. Denies any CP. Last episode of chest pain a few days ago.   CURRENT MEDS . aspirin EC  81 mg Oral Daily  . bacitracin   Topical BID  . calcitRIOL  0.25 mcg Oral Daily  . carvedilol  3.125 mg Oral BID  . darbepoetin (ARANESP) injection - DIALYSIS  200 mcg Subcutaneous Q Mon-HD  . dextromethorphan-guaiFENesin  1 tablet Oral BID  . feeding supplement (NEPRO CARB STEADY)  237 mL Oral BID BM  . ferric gluconate (FERRLECIT/NULECIT) IV  125 mg Intravenous Daily  . insulin aspart  0-15 Units Subcutaneous TID WC  . insulin aspart  0-5 Units Subcutaneous QHS  . insulin glargine  10 Units Subcutaneous QHS  . isosorbide mononitrate  90 mg Oral QHS  . multivitamin  1 tablet Oral QHS  . piperacillin-tazobactam (ZOSYN)  IV  2.25 g Intravenous Q8H  . pneumococcal 23 valent vaccine  0.5 mL Intramuscular Tomorrow-1000  . pregabalin  75 mg Oral BID  . ranolazine  1,000 mg Oral BID  . rosuvastatin  10 mg Oral QHS  . sodium chloride  3 mL Intravenous Q12H  . sodium chloride  3 mL Intravenous Q12H  . ticagrelor  90 mg Oral BID  . vancomycin  1,000 mg Intravenous Q48H    OBJECTIVE  Filed Vitals:   12/29/14 0305 12/29/14 0354 12/29/14 0755 12/29/14 0758  BP: 103/59   109/68  Pulse: 65  64 68  Temp: 97.8 F (36.6 C)  98.3 F (36.8 C)   TempSrc: Oral  Oral   Resp: 24  25 26   Height:      Weight:  195 lb 12.3 oz (88.8 kg)    SpO2: 96%  96% 98%    Intake/Output Summary (Last 24 hours) at 12/29/14 0836 Last data filed  at 12/29/14 0336  Gross per 24 hour  Intake    220 ml  Output      0 ml  Net    220 ml   Filed Weights   12/28/14 1345 12/29/14 0354  Weight: 199 lb 8.3 oz (90.5 kg) 195 lb 12.3 oz (88.8 kg)    PHYSICAL EXAM  General: Pleasant, NAD. Neuro: Alert and oriented X 3. Moves all extremities spontaneously. Psych: Normal affect. HEENT:  Normal  Neck: Supple without bruits or JVD. Lungs:  Resp regular and unlabored, CTA Heart: RRR no s3, s4, or murmurs. Abdomen: Soft, non-tender, non-distended, BS + x 4.  Extremities: No clubbing, cyanosis or edema. DP/PT/Radials 2+ and equal bilaterally.  Accessory Clinical Findings  CBC  Recent Labs  12/27/14 1135 12/28/14 0445 12/29/14 0330  WBC 4.7 5.1 4.6  NEUTROABS 3.8  --   --   HGB 6.9* 7.7* 7.3*  HCT 20.7* 22.7* 22.4*  MCV 84.8 85.1 86.5  PLT 89* 81* 90*   Basic Metabolic Panel  Recent Labs  12/26/14 1208 12/26/14 1319  12/28/14 0445 12/29/14 0330  NA 142  --   < > 139 140  K 4.2  --   < >  4.1 3.9  CL 107  --   < > 105 107  CO2 23  --   < > 24 24  GLUCOSE 167*  --   < > 106* 97  BUN 92*  --   < > 68* 72*  CREATININE 5.84*  --   < > 5.42* 6.26*  CALCIUM 8.0*  --   < > 8.1* 8.1*  PHOS 5.1* 3.1  --   --   --   < > = values in this interval not displayed. Liver Function Tests  Recent Labs  12/26/14 1208  ALBUMIN 2.9*    TELE NSR with LBBB, HR 50s    ECG  No new EKG  Echocardiogram 12/24/2014  LV EF: 30% -  35%  ------------------------------------------------------------------- Indications:   Dyspnea 786.09.  ------------------------------------------------------------------- Study Conclusions  - Left ventricle: The cavity size was mildly dilated. Systolic function was moderately to severely reduced. The estimated ejection fraction was in the range of 30% to 35%. Diffuse hypokinesis. - Aortic valve: Valve area (Vmax): 3.01 cm^2. - Mitral valve: There was mild  regurgitation.  Impressions:  - No cardiac source of emboli was indentified.    Radiology/Studies  Dg Chest 2 View  12/23/2014  CLINICAL DATA:  Syncopal episode this morning, fell several times, cough, CHF, hypertension, diabetes mellitus, former smoker EXAM: CHEST  2 VIEW COMPARISON:  11/03/2014 FINDINGS: Enlargement of cardiac silhouette post CABG. Mediastinal contours and pulmonary vascularity normal. RIGHT basilar infiltrate consistent with pneumonia. Remaining lungs clear. No pleural effusion or pneumothorax. IMPRESSION: Enlargement of cardiac silhouette post CABG. RIGHT basilar infiltrate consistent with pneumonia. Electronically Signed   By: Lavonia Dana M.D.   On: 12/23/2014 10:49   Ct Chest Wo Contrast  12/27/2014  CLINICAL DATA:  Shortness of breath. Prostate cancer. CABG. Former smoker. EXAM: CT CHEST WITHOUT CONTRAST TECHNIQUE: Multidetector CT imaging of the chest was performed following the standard protocol without IV contrast. COMPARISON:  03/04/2013 PET-CT.  12/23/2014 chest radiograph. FINDINGS: Images are motion degraded. Mediastinum/Nodes: Stable mild cardiomegaly. No pericardial fluid/thickening. Left main, left anterior descending, left circumflex and right coronary atherosclerosis, with ascending aortic and left internal mammary bypass grafts. Normal caliber thoracic aorta. Stable dilation of the main pulmonary artery (3.4 cm diameter). Normal visualized thyroid. Normal esophagus. No axillary adenopathy. Mildly enlarged 1.1 cm right paratracheal node (series 2/ image 13), previously 0.9 cm . Mildly enlarged 1.1 cm subcarinal node (2/26), increased from 0.9 cm. No gross hilar adenopathy on this noncontrast study. Lungs/Pleura: No pneumothorax. No pleural effusion. There is mild centrilobular emphysema. There are extensive patchy tree-in-bud opacities, patchy centrilobular nodular opacities and patchy peribronchovascular ground-glass opacity throughout both lungs, most prominent in  the right upper, right middle and right lower lobes. Upper abdomen: Unremarkable. Musculoskeletal: No aggressive appearing focal osseous lesions. Median sternotomy wires are intact. Marked degenerative changes in the thoracic spine. IMPRESSION: 1. Extensive patchy tree-in-bud opacities, patchy centrilobular nodular opacities and patchy peribronchovascular ground-glass opacities throughout both lungs, asymmetrically involving the lobes of the right lung. These findings are most in keeping with an infectious bronchiolitis/developing bronchopneumonia, with the differential including bacterial, fungal or tuberculosis infections. 2. New mild mediastinal lymphadenopathy, nonspecific, likely reactive. 3. Mild centrilobular emphysema. 4. Left main and 3 vessel coronary atherosclerosis status post CABG. 5. Stable dilated main pulmonary artery, suggesting chronic pulmonary arterial hypertension. Electronically Signed   By: Ilona Sorrel M.D.   On: 12/27/2014 10:44   Dg Chest Port 1 View  12/27/2014  CLINICAL DATA:  Patient status  post PICC line placement. Interval re- adjustment. EXAM: PORTABLE CHEST 1 VIEW COMPARISON:  Earlier same day FINDINGS: Interval re- adjustment PICC line with tip projecting over the superior vena cava. Stable cardiomegaly status post median sternotomy and CABG procedure. Unchanged heterogeneous opacities right lung base most compatible with pneumonia. No pleural effusion or pneumothorax. IMPRESSION: Interval re- adjustment PICC line with tip projecting over the superior vena cava. Persistent heterogeneous opacities right lung base favored to represent pneumonia. Cardiomegaly. Electronically Signed   By: Lovey Newcomer M.D.   On: 12/27/2014 16:20   Dg Chest Port 1 View  12/27/2014  CLINICAL DATA:  Patient status post PICC line placement. EXAM: PORTABLE CHEST 1 VIEW COMPARISON:  Chest radiograph 12/23/2014. FINDINGS: Interval insertion of PICC line with tip projecting over the expected location of  the central left brachiocephalic vein near the confluence with the superior vena cava. Stable cardiac and mediastinal contours, enlarged. Pulmonary vascular redistribution. No frank pulmonary edema. Re- demonstrated heterogeneous opacities within the right mid lower lung. No pleural effusion or pneumothorax. IMPRESSION: New PICC line tip projects at the confluence of the left brachiocephalic vein and superior vena cava. Consider repositioning. Cardiomegaly and pulmonary vascular redistribution. Re- demonstrated right mid and lower lung heterogeneous opacities most compatible with pneumonia. Electronically Signed   By: Lovey Newcomer M.D.   On: 12/27/2014 16:12    ASSESSMENT AND PLAN  DAVAUN NAJERA is a 69 y.o. male with PMH of CAD s/p CABG '01 at [facility] (LIMA-LAD, SVG-D2, SVG-OM1, SVG-right PDA, SVG-right PL1) along with multiple PCIs, ISM/chronic systolic CHF (EF 123XX123 123XX123), stage IV CKD (has had dialysis previously), chronic thrombocytopenia, T2DM, prostate cancer. He had a cardiac catheterization by Dr. Humphrey Rolls in [facility] 07/30/14 revealing all vein grafts occluded, (known dating to '09), patent LIMA to LAD, 70% ostial left main lesion, 70% ostial LCx, occluded RCA. He underwent PCI by Dr. Gwenlyn Found 08/07/14 for critical left main and ostial LCx at 90% and had synergy DES to both. He had another echocardiogram 12/24/1998 2016 revealing an EF of 30-35% with diffuse hypokinesis was mild mitral valve regurgitation. No real change from prior echo.  Patient was admitted with pneumonia, syncope, hypertension and worsening anemia. He was given a unit of PRBCs and hemoglobin is now 7.7. Troponin was mildly elevated at 0.11 which is a chronic issue given his chronic kidney disease. Last week he was playing pickle ball without any episodes of chest pain. One episode relieved with nitroglycerin.  1. RLL PNA  2. Syncope: monitor hypotension.   3. CAD s/p CABG 2001, prior POBA, PCI/DES of LM and LCx July  2016  - CABG '01 at [facility] (LIMA-LAD, SVG-D2, SVG-OM1, SVG-right PDA, SVG-right PL1), cath by Dr. Humphrey Rolls 07/30/2014 all vein grafts occluded, (known dating to '09), patent LIMA to LAD, 70% ostial left main lesion, 70% ostial LCx, occluded RCA  - need DAPT indefinitely unless absolutely need to stop given critical location of Left Main stent   - continue ASA and brilinta. Hydralazine stopped, SBP 100s this morning, continue Imdur and renexa, may need to cut back on Imdur if BP drop further.  4. Acute on chronic anemia: required transfusion, seen by GI in Sept and wanted to do outpatient evaluation  5. ISM/chronic systolic CHF (EF 123XX123 [id number])  6. Chronic HD  Signed, Almyra Deforest PA-C Pager: [phone]  Patient seen, examined. Available data reviewed. Agree with findings, assessment, and plan as outlined by Almyra Deforest, PA-C. Pt remains stable from cardiac perspective. Agree with discontinuation of  hydralazine. Will keep on low-dose carvedilol and Imdur 90 mg for now. No anginal symptoms. Plan discussed with daughter and patient. Will follow.  Sherren Mocha, M.D. 12/29/2014 3:11 PM

## 2014-12-29 NOTE — Progress Notes (Signed)
[facility] Progress Note   Joseph [name]  [id number]  DOB: 1945/11/03  DOA: 12/28/2014 PCP: Marden Noble, MD  Brief narrative: Joseph [name] is a 69 y.o. male with CKD 4, IRDM, CAD s/p CABG, chronic systolic CHF with ischemic cardiomyopathy who was admitted to [facility] with syncope, x 3, RLL pneumonia, hypotension with BP of 80/60, dyspnea and cough. He was hydrated aggressively for his hypotension after being admitted. He was placed on pressors for a short duration on 11/20. He also received 1 U PRBC. He was previously on dialysis from 10/15 through 3/16. He was started on dialysis again on 11/18. He was transferred to [facility] on 11/21 due to patient preference.    Subjective: Still has cough. No dyspnea at rest. Had 2 BM this AM- loose- 2 yesterday. Appetite normal. No vomiting or abdominal pain.   Assessment/Plan: Principal Problem:   RLL pneumonia / acute respiratory failure - initially traeted with IV Levaquin and then transitoned to Vancomycin and Zosyn on 11/21  Active Problems:  Septic shock/ syncope due to hypotension - Improved with treatment of underlying infection   ESRD on HD - has been off on dialysis for 1 yr and was stable - 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- dialysis resumed - being followed by renal team- dialysis today  CAD s/p CABG PCI/DES July 2016 - cont Brilinta and ASA- needs dual antiplatelet therapy indefinitely per cardiology - on Imdur and Ranexa- follow BP  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 - fluid balance per dialysis, cont Coreg    Type 2 diabetes mellitus with renal manifestations  - cont lantus and Novolog SSI    Anemia with iron deficiency  - Iron and procrit per nephrology    Thrombocytopenia  -chronic     Code Status:     Code Status Orders        Start     Ordered   12/28/14 1434  Full code   Continuous     12/28/14 1436    Advance Directive Documentation        Most Recent Value   Type of  Advance Directive  Healthcare Power of Attorney, Living will   Pre-existing out of facility DNR order (yellow form or pink MOST form)     "MOST" Form in Place?       Family Communication:   Disposition Plan: PT eval DVT prophylaxis: SCDs Consultants: nephrology, cardiology  Antibiotics: Anti-infectives    Start     Dose/Rate Route Frequency Ordered Stop   12/29/14 2000  vancomycin (VANCOCIN) IVPB 1000 mg/200 mL premix     1,000 mg 200 mL/hr over 60 Minutes Intravenous Every 48 hours 12/28/14 1655     12/28/14 1800  piperacillin-tazobactam (ZOSYN) IVPB 2.25 g     2.25 g 100 mL/hr over 30 Minutes Intravenous Every 8 hours 12/28/14 1514        Objective: Filed Weights   12/28/14 1345 12/29/14 0354  Weight: 90.5 kg (199 lb 8.3 oz) 88.8 kg (195 lb 12.3 oz)    Intake/Output Summary (Last 24 hours) at 12/29/14 1252 Last data filed at 12/29/14 0336  Gross per 24 hour  Intake    220 ml  Output      0 ml  Net    220 ml     Vitals Filed Vitals:   12/29/14 0354 12/29/14 0755 12/29/14 0758 12/29/14 1121  BP:   109/68   Pulse:  64 68   Temp:  98.3 F (36.8 C)  97.7 F (36.5 C)  TempSrc:  Oral  Oral  Resp:  25 26   Height:      Weight: 88.8 kg (195 lb 12.3 oz)     SpO2:  96% 98%     Exam:  General:  Pt is alert, not in acute distress  HEENT: No icterus, No thrush, oral mucosa moist  Cardiovascular: regular rate and rhythm, S1/S2 No murmur  Respiratory: clear to auscultation bilaterally   Abdomen: Soft, +Bowel sounds, non tender, non distended, no guarding  MSK: No LE edema, cyanosis or clubbing  Data Reviewed: Basic Metabolic Panel:  Recent Labs Lab 12/25/14 0756 12/26/14 1208 12/26/14 1319 12/27/14 1135 12/28/14 0445 12/29/14 0330  NA 137 142  --  140 139 140  K 4.5 4.2  --  4.2 4.1 3.9  CL 104 107  --  105 105 107  CO2 20* 23  --  25 24 24   GLUCOSE 221* 167*  --  154* 106* 97  BUN 115* 92*  --  66* 68* 72*  CREATININE 7.51* 5.84*  --  5.28* 5.42*  6.26*  CALCIUM 7.5* 8.0*  --  7.9* 8.1* 8.1*  PHOS  --  5.1* 3.1  --   --   --    Liver Function Tests:  Recent Labs Lab 12/26/14 1208  ALBUMIN 2.9*   No results for input(s): LIPASE, AMYLASE in the last 168 hours. No results for input(s): AMMONIA in the last 168 hours. CBC:  Recent Labs Lab 12/25/14 0439 12/26/14 1208 12/27/14 1135 12/28/14 0445 12/29/14 0330  WBC 4.6 4.7 4.7 5.1 4.6  NEUTROABS  --   --  3.8  --   --   HGB 7.5* 7.3* 6.9* 7.7* 7.3*  HCT 23.0* 21.6* 20.7* 22.7* 22.4*  MCV 84.9 84.2 84.8 85.1 86.5  PLT 70* 73* 89* 81* 90*   Cardiac Enzymes:  Recent Labs Lab 12/23/14 0955 12/23/14 1554 12/23/14 2206 12/24/14 0340 12/25/14 0756  CKTOTAL  --   --   --  211  --   TROPONINI 0.11* 0.09* 0.08* 0.08* 0.06*   BNP (last 3 results)  Recent Labs  08/05/14 2113 11/04/14 0120  BNP 1301.3* 2287.4*    ProBNP (last 3 results) No results for input(s): PROBNP in the last 8760 hours.  CBG:  Recent Labs Lab 12/28/14 0708 12/28/14 1138 12/28/14 1738 12/28/14 2241 12/29/14 0831  GLUCAP 98 166* 110* 131* 108*    Recent Results (from the past 240 hour(s))  Culture, blood (routine x 2) Call MD if unable to obtain prior to antibiotics being given     Status: None (Preliminary result)   Collection Time: 12/23/14  3:54 PM  Result Value Ref Range Status   Specimen Description BLOOD RIGHT ASSIST CONTROL  Final   Special Requests   Final    BOTTLES DRAWN AEROBIC AND ANAEROBIC 8CC AERO 5CC ANA   Culture NO GROWTH 4 DAYS  Final   Report Status PENDING  Incomplete  Culture, blood (routine x 2) Call MD if unable to obtain prior to antibiotics being given     Status: None (Preliminary result)   Collection Time: 12/23/14  5:24 PM  Result Value Ref Range Status   Specimen Description BLOOD RIGHT HAND  Final   Special Requests BOTTLES DRAWN AEROBIC AND ANAEROBIC 4CC  Final   Culture NO GROWTH 4 DAYS  Final   Report Status PENDING  Incomplete  Culture,  sputum-assessment     Status:  None   Collection Time: 12/24/14 12:38 PM  Result Value Ref Range Status   Specimen Description EXPECTORATED SPUTUM  Final   Special Requests NONE  Final   Sputum evaluation THIS SPECIMEN IS ACCEPTABLE FOR SPUTUM CULTURE  Final   Report Status 12/24/2014 FINAL  Final  Culture, respiratory (NON-Expectorated)     Status: None   Collection Time: 12/24/14 12:38 PM  Result Value Ref Range Status   Specimen Description EXPECTORATED SPUTUM  Final   Special Requests NONE Reflexed from [id number]  Final   Gram Stain   Final    GOOD SPECIMEN - 80-90% WBCS MANY WBC SEEN MODERATE GRAM POSITIVE RODS FEW GRAM POSITIVE COCCI    Culture LIGHT GROWTH STAPHYLOCOCCUS AUREUS  Final   Report Status 12/28/2014 FINAL  Final   Organism ID, Bacteria STAPHYLOCOCCUS AUREUS  Final      Susceptibility   Staphylococcus aureus - MIC*    CIPROFLOXACIN <=0.5 SENSITIVE Sensitive     GENTAMICIN <=0.5 SENSITIVE Sensitive     OXACILLIN 0.5 SENSITIVE Sensitive     TRIMETH/SULFA <=10 SENSITIVE Sensitive     CEFOXITIN SCREEN NEGATIVE Sensitive     Inducible Clindamycin NEGATIVE Sensitive     ERYTHROMYCIN Value in next row Sensitive      SENSITIVE0.5    TETRACYCLINE Value in next row Sensitive      SENSITIVE<=1    CLINDAMYCIN Value in next row Sensitive      SENSITIVE<=0.25    LEVOFLOXACIN Value in next row Sensitive      SENSITIVE0.25    LINEZOLID Value in next row Sensitive      SENSITIVE2    * LIGHT GROWTH STAPHYLOCOCCUS AUREUS  MRSA PCR Screening     Status: None   Collection Time: 12/27/14  1:52 AM  Result Value Ref Range Status   MRSA by PCR NEGATIVE NEGATIVE Final    Comment:        The GeneXpert MRSA Assay (FDA approved for NASAL specimens only), is one component of a comprehensive MRSA colonization surveillance program. It is not intended to diagnose MRSA infection nor to guide or monitor treatment for MRSA infections.      Studies: Dg Chest Port 1  View  12/27/2014  CLINICAL DATA:  Patient status post PICC line placement. Interval re- adjustment. EXAM: PORTABLE CHEST 1 VIEW COMPARISON:  Earlier same day FINDINGS: Interval re- adjustment PICC line with tip projecting over the superior vena cava. Stable cardiomegaly status post median sternotomy and CABG procedure. Unchanged heterogeneous opacities right lung base most compatible with pneumonia. No pleural effusion or pneumothorax. IMPRESSION: Interval re- adjustment PICC line with tip projecting over the superior vena cava. Persistent heterogeneous opacities right lung base favored to represent pneumonia. Cardiomegaly. Electronically Signed   By: Lovey Newcomer M.D.   On: 12/27/2014 16:20   Dg Chest Port 1 View  12/27/2014  CLINICAL DATA:  Patient status post PICC line placement. EXAM: PORTABLE CHEST 1 VIEW COMPARISON:  Chest radiograph 12/23/2014. FINDINGS: Interval insertion of PICC line with tip projecting over the expected location of the central left brachiocephalic vein near the confluence with the superior vena cava. Stable cardiac and mediastinal contours, enlarged. Pulmonary vascular redistribution. No frank pulmonary edema. Re- demonstrated heterogeneous opacities within the right mid lower lung. No pleural effusion or pneumothorax. IMPRESSION: New PICC line tip projects at the confluence of the left brachiocephalic vein and superior vena cava. Consider repositioning. Cardiomegaly and pulmonary vascular redistribution. Re- demonstrated right mid and lower lung heterogeneous opacities most  compatible with pneumonia. Electronically Signed   By: Lovey Newcomer M.D.   On: 12/27/2014 16:12    Scheduled Meds:  Scheduled Meds: . aspirin EC  81 mg Oral Daily  . bacitracin   Topical BID  . calcitRIOL  0.25 mcg Oral Daily  . carvedilol  3.125 mg Oral BID  . darbepoetin (ARANESP) injection - DIALYSIS  200 mcg Subcutaneous Q Mon-HD  . dextromethorphan-guaiFENesin  1 tablet Oral BID  . feeding  supplement (NEPRO CARB STEADY)  237 mL Oral BID BM  . ferric gluconate (FERRLECIT/NULECIT) IV  125 mg Intravenous Daily  . insulin aspart  0-15 Units Subcutaneous TID WC  . insulin aspart  0-5 Units Subcutaneous QHS  . insulin glargine  10 Units Subcutaneous QHS  . isosorbide mononitrate  90 mg Oral QHS  . multivitamin  1 tablet Oral QHS  . piperacillin-tazobactam (ZOSYN)  IV  2.25 g Intravenous Q8H  . pregabalin  75 mg Oral BID  . ranolazine  1,000 mg Oral BID  . rosuvastatin  10 mg Oral QHS  . sodium chloride  3 mL Intravenous Q12H  . sodium chloride  3 mL Intravenous Q12H  . ticagrelor  90 mg Oral BID  . vancomycin  1,000 mg Intravenous Q48H   Continuous Infusions:   Time spent on care of this patient: 35 min   Cubero, MD 12/29/2014, 12:52 PM  LOS: 1 day   [facility] Office  [phone] Pager - Text Page per [email or web address] If 7PM-7AM, please contact night-coverage [email or web address]

## 2014-12-29 NOTE — Procedures (Signed)
I was present at this session.  I have reviewed the session itself and made appropriate changes.  Hd via LLA AVF low, flows 3rd HD, bp low 100s  Merica Prell L 11/22/20164:39 PM

## 2014-12-29 NOTE — Progress Notes (Signed)
Subjective: Interval History: has no complaint, understands may need HD.  Objective: Vital signs in last 24 hours: Temp:  [97.5 F (36.4 C)-98.3 F (36.8 C)] 98.3 F (36.8 C) (11/22 0755) Pulse Rate:  [64-92] 68 (11/22 0758) Resp:  [24-37] 26 (11/22 0758) BP: (103-139)/(59-124) 109/68 mmHg (11/22 0758) SpO2:  [90 %-98 %] 98 % (11/22 0758) Weight:  [88.8 kg (195 lb 12.3 oz)-90.5 kg (199 lb 8.3 oz)] 88.8 kg (195 lb 12.3 oz) (11/22 0354) Weight change:   Intake/Output from previous day: 11/21 0701 - 11/22 0700 In: 220 [P.O.:120; IV Piggyback:100] Out: -  Intake/Output this shift:    General appearance: alert, cooperative and no distress Resp: diminished breath sounds bilaterally, rales LLL and RLL and rhonchi LLL and RLL Cardio: S1, S2 normal and systolic murmur: holosystolic 2/6, blowing at apex GI: soft, liver down 5 cm, pos bs Extremities: AVF LLA B&T, hematoma upper part  Lab Results:  Recent Labs  12/28/14 0445 12/29/14 0330  WBC 5.1 4.6  HGB 7.7* 7.3*  HCT 22.7* 22.4*  PLT 81* 90*   BMET:  Recent Labs  12/28/14 0445 12/29/14 0330  NA 139 140  K 4.1 3.9  CL 105 107  CO2 24 24  GLUCOSE 106* 97  BUN 68* 72*  CREATININE 5.42* 6.26*  CALCIUM 8.1* 8.1*    Recent Labs  12/26/14 1319  PTH 148*   Iron Studies:  Recent Labs  12/28/14 1633  IRON 21*  TIBC 244*    Studies/Results: Ct Chest Wo Contrast  12/27/2014  CLINICAL DATA:  Shortness of breath. Prostate cancer. CABG. Former smoker. EXAM: CT CHEST WITHOUT CONTRAST TECHNIQUE: Multidetector CT imaging of the chest was performed following the standard protocol without IV contrast. COMPARISON:  03/04/2013 PET-CT.  12/23/2014 chest radiograph. FINDINGS: Images are motion degraded. Mediastinum/Nodes: Stable mild cardiomegaly. No pericardial fluid/thickening. Left main, left anterior descending, left circumflex and right coronary atherosclerosis, with ascending aortic and left internal mammary bypass  grafts. Normal caliber thoracic aorta. Stable dilation of the main pulmonary artery (3.4 cm diameter). Normal visualized thyroid. Normal esophagus. No axillary adenopathy. Mildly enlarged 1.1 cm right paratracheal node (series 2/ image 13), previously 0.9 cm . Mildly enlarged 1.1 cm subcarinal node (2/26), increased from 0.9 cm. No gross hilar adenopathy on this noncontrast study. Lungs/Pleura: No pneumothorax. No pleural effusion. There is mild centrilobular emphysema. There are extensive patchy tree-in-bud opacities, patchy centrilobular nodular opacities and patchy peribronchovascular ground-glass opacity throughout both lungs, most prominent in the right upper, right middle and right lower lobes. Upper abdomen: Unremarkable. Musculoskeletal: No aggressive appearing focal osseous lesions. Median sternotomy wires are intact. Marked degenerative changes in the thoracic spine. IMPRESSION: 1. Extensive patchy tree-in-bud opacities, patchy centrilobular nodular opacities and patchy peribronchovascular ground-glass opacities throughout both lungs, asymmetrically involving the lobes of the right lung. These findings are most in keeping with an infectious bronchiolitis/developing bronchopneumonia, with the differential including bacterial, fungal or tuberculosis infections. 2. New mild mediastinal lymphadenopathy, nonspecific, likely reactive. 3. Mild centrilobular emphysema. 4. Left main and 3 vessel coronary atherosclerosis status post CABG. 5. Stable dilated main pulmonary artery, suggesting chronic pulmonary arterial hypertension. Electronically Signed   By: Ilona Sorrel M.D.   On: 12/27/2014 10:44   Dg Chest Port 1 View  12/27/2014  CLINICAL DATA:  Patient status post PICC line placement. Interval re- adjustment. EXAM: PORTABLE CHEST 1 VIEW COMPARISON:  Earlier same day FINDINGS: Interval re- adjustment PICC line with tip projecting over the superior vena cava. Stable cardiomegaly status  post median sternotomy  and CABG procedure. Unchanged heterogeneous opacities right lung base most compatible with pneumonia. No pleural effusion or pneumothorax. IMPRESSION: Interval re- adjustment PICC line with tip projecting over the superior vena cava. Persistent heterogeneous opacities right lung base favored to represent pneumonia. Cardiomegaly. Electronically Signed   By: Lovey Newcomer M.D.   On: 12/27/2014 16:20   Dg Chest Port 1 View  12/27/2014  CLINICAL DATA:  Patient status post PICC line placement. EXAM: PORTABLE CHEST 1 VIEW COMPARISON:  Chest radiograph 12/23/2014. FINDINGS: Interval insertion of PICC line with tip projecting over the expected location of the central left brachiocephalic vein near the confluence with the superior vena cava. Stable cardiac and mediastinal contours, enlarged. Pulmonary vascular redistribution. No frank pulmonary edema. Re- demonstrated heterogeneous opacities within the right mid lower lung. No pleural effusion or pneumothorax. IMPRESSION: New PICC line tip projects at the confluence of the left brachiocephalic vein and superior vena cava. Consider repositioning. Cardiomegaly and pulmonary vascular redistribution. Re- demonstrated right mid and lower lung heterogeneous opacities most compatible with pneumonia. Electronically Signed   By: Lovey Newcomer M.D.   On: 12/27/2014 16:12    I have reviewed the patient's current medications.  Assessment/Plan: 1 CKD5  Worsening GFR.  Needs to resume HD as not enough GFR on his own to maintain. 3rd HD so will go slow.  2 BP low stop hydral. 3 Anemia ESA, Fe low replet 4 DM controlled 5 Pneu on AB 6 CAD 7 HPTH on vit D P Hd, esa, Fe, stop hydral.  Control DM   LOS: 1 day   Falyn Rubel L 12/29/2014,8:07 AM

## 2014-12-29 NOTE — Progress Notes (Addendum)
Skin test on left forearm is less than 35mm. Negative PPD. Patient was last tested for LTBI in March 2015 which was negative. No risk factors for TB since that time. Can discontinue airborne precautions

## 2014-12-29 NOTE — Progress Notes (Signed)
ANTIBIOTIC CONSULT NOTE - FOLLOW UP  Pharmacy Consult for Vancomycin and Zosyn Indication: pneumonia  Allergies  Allergen Reactions  . Sulfa Antibiotics Itching    hives  . Morphine And Related     Makes patient feel weird    Patient Measurements: Height: 5' 10.5" (179.1 cm) Weight: 195 lb 12.3 oz (88.8 kg) IBW/kg (Calculated) : 74.15  Vital Signs: Temp: 97.6 F (36.4 C) (11/22 1335) Temp Source: Oral (11/22 1335) BP: 110/59 mmHg (11/22 1530) Pulse Rate: 68 (11/22 1530) Intake/Output from previous day: 11/21 0701 - 11/22 0700 In: 220 [P.O.:120; IV Piggyback:100] Out: -  Intake/Output from this shift:    Labs:  Recent Labs  12/27/14 1135 12/28/14 0445 12/29/14 0330  WBC 4.7 5.1 4.6  HGB 6.9* 7.7* 7.3*  PLT 89* 81* 90*  CREATININE 5.28* 5.42* 6.26*   Estimated Creatinine Clearance: 12.2 mL/min (by C-G formula based on Cr of 6.26).  Microbiology: Recent Results (from the past 720 hour(s))  Culture, blood (routine x 2) Call MD if unable to obtain prior to antibiotics being given     Status: None (Preliminary result)   Collection Time: 12/23/14  3:54 PM  Result Value Ref Range Status   Specimen Description BLOOD RIGHT ASSIST CONTROL  Final   Special Requests   Final    BOTTLES DRAWN AEROBIC AND ANAEROBIC [place] ANA   Culture NO GROWTH 4 DAYS  Final   Report Status PENDING  Incomplete  Culture, blood (routine x 2) Call MD if unable to obtain prior to antibiotics being given     Status: None (Preliminary result)   Collection Time: 12/23/14  5:24 PM  Result Value Ref Range Status   Specimen Description BLOOD RIGHT HAND  Final   Special Requests BOTTLES DRAWN AEROBIC AND ANAEROBIC 4CC  Final   Culture NO GROWTH 4 DAYS  Final   Report Status PENDING  Incomplete  Culture, sputum-assessment     Status: None   Collection Time: 12/24/14 12:38 PM  Result Value Ref Range Status   Specimen Description EXPECTORATED SPUTUM  Final   Special Requests NONE  Final   Sputum evaluation THIS SPECIMEN IS ACCEPTABLE FOR SPUTUM CULTURE  Final   Report Status 12/24/2014 FINAL  Final  Culture, respiratory (NON-Expectorated)     Status: None   Collection Time: 12/24/14 12:38 PM  Result Value Ref Range Status   Specimen Description EXPECTORATED SPUTUM  Final   Special Requests NONE Reflexed from [id number]  Final   Gram Stain   Final    GOOD SPECIMEN - 80-90% WBCS MANY WBC SEEN MODERATE GRAM POSITIVE RODS FEW GRAM POSITIVE COCCI    Culture LIGHT GROWTH STAPHYLOCOCCUS AUREUS  Final   Report Status 12/28/2014 FINAL  Final   Organism ID, Bacteria STAPHYLOCOCCUS AUREUS  Final      Susceptibility   Staphylococcus aureus - MIC*    CIPROFLOXACIN <=0.5 SENSITIVE Sensitive     GENTAMICIN <=0.5 SENSITIVE Sensitive     OXACILLIN 0.5 SENSITIVE Sensitive     TRIMETH/SULFA <=10 SENSITIVE Sensitive     CEFOXITIN SCREEN NEGATIVE Sensitive     Inducible Clindamycin NEGATIVE Sensitive     ERYTHROMYCIN Value in next row Sensitive      SENSITIVE0.5    TETRACYCLINE Value in next row Sensitive      SENSITIVE<=1    CLINDAMYCIN Value in next row Sensitive      SENSITIVE<=0.25    LEVOFLOXACIN Value in next row Sensitive      SENSITIVE0.25  LINEZOLID Value in next row Sensitive      SENSITIVE2    * LIGHT GROWTH STAPHYLOCOCCUS AUREUS  MRSA PCR Screening     Status: None   Collection Time: 12/27/14  1:52 AM  Result Value Ref Range Status   MRSA by PCR NEGATIVE NEGATIVE Final    Comment:        The GeneXpert MRSA Assay (FDA approved for NASAL specimens only), is one component of a comprehensive MRSA colonization surveillance program. It is not intended to diagnose MRSA infection nor to guide or monitor treatment for MRSA infections.   C difficile quick scan w PCR reflex     Status: None   Collection Time: 12/29/14 11:28 AM  Result Value Ref Range Status   C Diff antigen NEGATIVE NEGATIVE Final   C Diff toxin NEGATIVE NEGATIVE Final   C Diff interpretation Negative  for toxigenic C. difficile  Final   Assessment:  Transferred from [place] to [facility] on 12/28/14.  Vanc and Zosyn begun at [facility] on 12/27/14.  Got Vanc 1 gm IV x 2 doses on 11/20 for total 2gm IV loading dose. 2nd dose given ~6pm.  CKD 5, may need chronic HD. BUN/Creatinine trended up.       Currently being dialyzed in his room on 3S.    Dialyzed on 11/18 and 11/19 at [facility].      Goal of Therapy:  Vancomycin trough level 15-20 mcg/ml, or 15-25 mcg/ml pre-dialysis. Appropriate Zosyn dose for renal function  Plan:   Vancomycin 1 gram IV after dialysis today.  Standing Vanc order cancelled for now.  Will order next Vanc dose with next HD, or according to renal status.  Continue Zosyn 2.25 gm IV q8hrs.  Follow renal function, progress.  Arty Baumgartner, [place] Pager: [phone] 12/29/2014,3:47 PM

## 2014-12-30 ENCOUNTER — Inpatient Hospital Stay (HOSPITAL_COMMUNITY): Payer: Medicare Other

## 2014-12-30 DIAGNOSIS — J189 Pneumonia, unspecified organism: Secondary | ICD-10-CM

## 2014-12-30 DIAGNOSIS — I255 Ischemic cardiomyopathy: Secondary | ICD-10-CM

## 2014-12-30 LAB — CBC
HEMATOCRIT: 22.9 % — AB (ref 39.0–52.0)
HEMOGLOBIN: 7.3 g/dL — AB (ref 13.0–17.0)
MCH: 27.7 pg (ref 26.0–34.0)
MCHC: 31.9 g/dL (ref 30.0–36.0)
MCV: 86.7 fL (ref 78.0–100.0)
Platelets: 91 10*3/uL — ABNORMAL LOW (ref 150–400)
RBC: 2.64 MIL/uL — AB (ref 4.22–5.81)
RDW: 15.7 % — ABNORMAL HIGH (ref 11.5–15.5)
WBC: 5 10*3/uL (ref 4.0–10.5)

## 2014-12-30 LAB — BASIC METABOLIC PANEL
ANION GAP: 10 (ref 5–15)
BUN: 53 mg/dL — AB (ref 6–20)
CHLORIDE: 105 mmol/L (ref 101–111)
CO2: 26 mmol/L (ref 22–32)
Calcium: 8 mg/dL — ABNORMAL LOW (ref 8.9–10.3)
Creatinine, Ser: 5.08 mg/dL — ABNORMAL HIGH (ref 0.61–1.24)
GFR calc Af Amer: 12 mL/min — ABNORMAL LOW (ref >60)
GFR, EST NON AFRICAN AMERICAN: 11 mL/min — AB (ref >60)
Glucose, Bld: 97 mg/dL (ref 65–99)
POTASSIUM: 3.8 mmol/L (ref 3.5–5.1)
SODIUM: 141 mmol/L (ref 135–145)

## 2014-12-30 LAB — GLUCOSE, CAPILLARY
GLUCOSE-CAPILLARY: 97 mg/dL (ref 65–99)
GLUCOSE-CAPILLARY: 157 mg/dL — AB (ref 65–99)
Glucose-Capillary: 149 mg/dL — ABNORMAL HIGH (ref 65–99)
Glucose-Capillary: 91 mg/dL (ref 65–99)
Glucose-Capillary: 203 mg/dL — ABNORMAL HIGH (ref 65–99)

## 2014-12-30 LAB — CULTURE, BLOOD (ROUTINE X 2)
CULTURE: NO GROWTH
Culture: NO GROWTH

## 2014-12-30 LAB — HEPATITIS B SURFACE ANTIBODY,QUALITATIVE: HEP B S AB: NONREACTIVE

## 2014-12-30 LAB — HEPATITIS B CORE ANTIBODY, TOTAL: Hep B Core Total Ab: NEGATIVE

## 2014-12-30 MED ORDER — LEVOFLOXACIN 500 MG PO TABS: 500.0000 mg | ORAL_TABLET | ORAL | Status: DC

## 2014-12-30 MED ORDER — HYDROCODONE-ACETAMINOPHEN 5-325 MG PO TABS
1.0000 | ORAL_TABLET | Freq: Four times a day (QID) | ORAL | Status: DC | PRN
  Administered 2014-12-30 (×2): 1 via ORAL
  Filled 2014-12-30 (×2): qty 1

## 2014-12-30 MED ORDER — LEVOFLOXACIN 750 MG PO TABS
750.0000 mg | ORAL_TABLET | Freq: Once | ORAL | Status: AC
Start: 2014-12-30 — End: 2014-12-30
  Administered 2014-12-30: 750 mg via ORAL
  Filled 2014-12-30: qty 1

## 2014-12-30 NOTE — Care Management Note (Addendum)
Case Management Note  Patient Details  Name: TAMARCUS DROLL MRN: [id number] Date of Birth: 1945-05-16  Subjective/Objective:                 Admitted with CAP, hx of CAD PCI 2016, NSTEMI, bicytopenia with anemia and thrombocytopenia and ESRD-was on dialysis 11/2013-->04/2014 and stopped, restarted 12/25/14. From home  alone. Independent with ADL's. No dme usage.        Action/Plan: Return to home whem medically stable. CM to f/u with d/c disposition.  Expected Discharge Date:                  Expected Discharge Plan:  Home/Self Care  In-House Referral:     Discharge planning Services  CM Consult  Post Acute Care Choice:    Choice offered to:     DME Arranged:    DME Agency:     HH Arranged:    HH Agency:     Status of Service:  In process, will continue to follow  Medicare Important Message Given:    Date Medicare IM Given:    Medicare IM give by:    Date Additional Medicare IM Given:    Additional Medicare Important Message give by:     If discussed at [place] of Stay Meetings, dates discussed:    Additional Comments:   April Robertson (Daughter) [phone]   [address] [place], [place] [phone] 12/30/2014, 9:24 AM

## 2014-12-30 NOTE — Progress Notes (Signed)
Joseph [name] [id number] DOB: 1946-01-18 DOA: 12/28/2014 PCP: Marden Noble, MD  Brief narrative: 69 y/o ? 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  He was hydrated aggressively for his hypotension after being admitted.  He was placed on pressors for a short duration on 11/20.  He also received 1 U PRBC 11/19. He was previously on dialysis from 10/15 through 3/16.  He was started on dialysis again on 11/18.  ransferred to [facility] on 11/21 due to patient preference.   Past medical history-As per Problem list Chart reviewed as below-   Consultants:  renal  Procedures:  none  Antibiotics:  Levaquin 11/16-11/18  Vancomycin 11/20  Zosyn 11/20    Subjective   Feels fair no distress no n/v/cp No fever nor chills no sputum tol diet but doesn't like it    Objective    Interim History:  Telemetry: sinusbrady + PVC's   Objective: Filed Vitals:   12/29/14 2255 12/29/14 2300 12/30/14 0353 12/30/14 0359  BP:  111/71  110/61  Pulse:  72  64  Temp: 97.8 F (36.6 C)  98.2 F (36.8 C)   TempSrc: Oral  Oral   Resp:  17    Height:    5' 10.5" (1.791 m)  Weight:    88.2 kg (194 lb 7.1 oz)  SpO2:  97%  98%    Intake/Output Summary (Last 24 hours) at 12/30/14 0747 Last data filed at 12/30/14 0124  Gross per 24 hour  Intake    500 ml  Output   1000 ml  Net   -500 ml    Exam:  General: eomi ncat Cardiovascular: s1 s 2no m/r/g Respiratory: clear no added sounhd Abdomen: soft nt nd no rebound Skin LUE access covered-Has a dialysis access in L neck Neuro intact  Data Reviewed: Basic Metabolic  Panel:  Recent Labs Lab 12/26/14 1208 12/26/14 1319 12/27/14 1135 12/28/14 0445 12/29/14 0330 12/30/14 0435  NA 142  --  140 139 140 141  K 4.2  --  4.2 4.1 3.9 3.8  CL 107  --  105 105 107 105  CO2 23  --  25 24 24 26   GLUCOSE 167*  --  154* 106* 97 97  BUN 92*  --  66* 68* 72* 53*  CREATININE 5.84*  --  5.28* 5.42* 6.26* 5.08*  CALCIUM 8.0*  --  7.9* 8.1* 8.1* 8.0*  PHOS 5.1* 3.1  --   --   --   --    Liver Function Tests:  Recent Labs Lab 12/26/14 1208  ALBUMIN 2.9*   No results for input(s): LIPASE, AMYLASE in the last 168 hours. No results for input(s): AMMONIA in the last 168 hours. CBC:  Recent Labs Lab 12/26/14 1208 12/27/14 1135 12/28/14 0445 12/29/14 0330 12/30/14 0435  WBC 4.7 4.7 5.1 4.6 5.0  NEUTROABS  --  3.8  --   --   --   HGB 7.3* 6.9* 7.7* 7.3* 7.3*  HCT 21.6* 20.7* 22.7* 22.4* 22.9*  MCV 84.2 84.8 85.1 86.5 86.7  PLT 73* 89* 81* 90* 91*   Cardiac Enzymes:  Recent Labs Lab 12/23/14 0955 12/23/14 1554 12/23/14 2206 12/24/14 0340 12/25/14 0756  CKTOTAL  --   --   --  211  --   TROPONINI 0.11* 0.09* 0.08* 0.08* 0.06*   BNP: Invalid input(s): POCBNP CBG:  Recent Labs Lab 12/28/14 2241 12/29/14 0831 12/29/14 1125 12/29/14 1715 12/29/14 2101  GLUCAP 131* 108* 177* 132* 173*    Recent Results (from the past 240 hour(s))  Culture, blood (routine x 2) Call MD if unable to obtain prior to antibiotics being given     Status: None (Preliminary result)   Collection Time: 12/23/14  3:54 PM  Result Value Ref Range Status   Specimen Description BLOOD RIGHT ASSIST CONTROL  Final   Special Requests   Final    BOTTLES DRAWN AEROBIC AND ANAEROBIC 8CC AERO 5CC ANA   Culture NO GROWTH 4 DAYS  Final   Report Status PENDING  Incomplete  Culture, blood (routine x 2) Call MD if unable to obtain prior to antibiotics being given     Status: None (Preliminary result)   Collection Time: 12/23/14  5:24 PM  Result Value Ref Range Status   Specimen  Description BLOOD RIGHT HAND  Final   Special Requests BOTTLES DRAWN AEROBIC AND ANAEROBIC 4CC  Final   Culture NO GROWTH 4 DAYS  Final   Report Status PENDING  Incomplete  Culture, sputum-assessment     Status: None   Collection Time: 12/24/14 12:38 PM  Result Value Ref Range Status   Specimen Description EXPECTORATED SPUTUM  Final   Special Requests NONE  Final   Sputum evaluation THIS SPECIMEN IS ACCEPTABLE FOR SPUTUM CULTURE  Final   Report Status 12/24/2014 FINAL  Final  Culture, respiratory (NON-Expectorated)     Status: None   Collection Time: 12/24/14 12:38 PM  Result Value Ref Range Status   Specimen Description EXPECTORATED SPUTUM  Final   Special Requests NONE Reflexed from [id number]  Final   Gram Stain   Final    GOOD SPECIMEN - 80-90% WBCS MANY WBC SEEN MODERATE GRAM POSITIVE RODS FEW GRAM POSITIVE COCCI    Culture LIGHT GROWTH STAPHYLOCOCCUS AUREUS  Final   Report Status 12/28/2014 FINAL  Final   Organism ID, Bacteria STAPHYLOCOCCUS AUREUS  Final      Susceptibility   Staphylococcus aureus - MIC*    CIPROFLOXACIN <=0.5 SENSITIVE Sensitive     GENTAMICIN <=0.5 SENSITIVE Sensitive     OXACILLIN 0.5 SENSITIVE Sensitive     TRIMETH/SULFA <=10 SENSITIVE Sensitive     CEFOXITIN SCREEN NEGATIVE Sensitive     Inducible Clindamycin NEGATIVE Sensitive     ERYTHROMYCIN Value in next row Sensitive      SENSITIVE0.5    TETRACYCLINE Value in next row Sensitive      SENSITIVE<=1    CLINDAMYCIN Value in next row Sensitive      SENSITIVE<=0.25    LEVOFLOXACIN Value in next row Sensitive      SENSITIVE0.25    LINEZOLID Value in next row Sensitive      SENSITIVE2    * LIGHT GROWTH STAPHYLOCOCCUS AUREUS  MRSA PCR Screening     Status: None   Collection Time: 12/27/14  1:52 AM  Result Value Ref Range Status   MRSA by PCR NEGATIVE NEGATIVE Final    Comment:        The GeneXpert MRSA Assay (FDA approved for NASAL specimens only), is one component of a comprehensive MRSA  colonization surveillance program. It is not intended to diagnose MRSA infection nor to guide or monitor treatment for MRSA infections.   C difficile  quick scan w PCR reflex     Status: None   Collection Time: 12/29/14 11:28 AM  Result Value Ref Range Status   C Diff antigen NEGATIVE NEGATIVE Final   C Diff toxin NEGATIVE NEGATIVE Final   C Diff interpretation Negative for toxigenic C. difficile  Final     Studies:              All Imaging reviewed and is as per above notation   Scheduled Meds: . aspirin EC  81 mg Oral Daily  . bacitracin   Topical BID  . calcitRIOL  0.25 mcg Oral Daily  . carvedilol  3.125 mg Oral BID  . darbepoetin (ARANESP) injection - DIALYSIS  200 mcg Subcutaneous Q Mon-HD  . dextromethorphan-guaiFENesin  1 tablet Oral BID  . feeding supplement (NEPRO CARB STEADY)  237 mL Oral BID BM  . ferric gluconate (FERRLECIT/NULECIT) IV  125 mg Intravenous Daily  . insulin aspart  0-15 Units Subcutaneous TID WC  . insulin aspart  0-5 Units Subcutaneous QHS  . insulin glargine  10 Units Subcutaneous QHS  . isosorbide mononitrate  90 mg Oral QHS  . multivitamin  1 tablet Oral QHS  . piperacillin-tazobactam (ZOSYN)  IV  2.25 g Intravenous Q8H  . pregabalin  75 mg Oral BID  . ranolazine  1,000 mg Oral BID  . rosuvastatin  10 mg Oral QHS  . sodium chloride  3 mL Intravenous Q12H  . sodium chloride  3 mL Intravenous Q12H  . ticagrelor  90 mg Oral BID   Continuous Infusions:    Assessment/Plan:  RLL pneumonia / acute respiratory failure - initially RX with IV Levaquin and then transitoned to Vancomycin and Zosyn on 11/21 We will de-escalate Abx back to levaquin 11/23 Monitor fever curve and get CBC + Diff am Follow CXR ordered for this am   Septic shock/ syncope due to hypotension - Improved with treatment of underlying infection  -OK for transfer Out of SDU as BP's have been very stable  ESRD on HD - has been off on dialysis for 1 yr and was stable - now  back in CKD 5- dialysis resumed - being followed by renal team- dialysis to occur next on 11/25  CAD s/p CABG PCI/DES July 2016 - cont Brilinta and ASA- needs dual antiplatelet therapy indefinitely per cardiology - on Imdur and Ranexa 1000 bid  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 - fluid balance per dialysis, cont Coreg 3.125 bud, Imdur 90 QHS   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

## 2014-12-30 NOTE — Progress Notes (Signed)
Report called, pt transferring [phone] via w/c with belongings.

## 2014-12-30 NOTE — Progress Notes (Signed)
Subjective: Interval History: has complaints sob with minimal exertion.  Objective: Vital signs in last 24 hours: Temp:  [97.5 F (36.4 C)-98.2 F (36.8 C)] 98.2 F (36.8 C) (11/23 0353) Pulse Rate:  [62-90] 64 (11/23 0359) Resp:  [17-27] 17 (11/22 2300) BP: (110-147)/(58-78) 110/61 mmHg (11/23 0359) SpO2:  [96 %-100 %] 98 % (11/23 0359) Weight:  [87.21 kg (192 lb 4.2 oz)-88.2 kg (194 lb 7.1 oz)] 88.2 kg (194 lb 7.1 oz) (11/23 0359) Weight change: -2.3 kg (-5 lb 1.1 oz)  Intake/Output from previous day: 11/22 0701 - 11/23 0700 In: 500 [P.O.:240; IV Piggyback:260] Out: 1000  Intake/Output this shift:    General appearance: alert, cooperative and no distress Resp: diminished breath sounds bilaterally, rales bibasilar and rhonchi bibasilar Cardio: S1, S2 normal and systolic murmur: holosystolic 2/6, blowing at apex GI: pos bs, liver down 3 cm Extremities: AVF LLA  Lab Results:  Recent Labs  12/29/14 0330 12/30/14 0435  WBC 4.6 5.0  HGB 7.3* 7.3*  HCT 22.4* 22.9*  PLT 90* 91*   BMET:  Recent Labs  12/29/14 0330 12/30/14 0435  NA 140 141  K 3.9 3.8  CL 107 105  CO2 24 26  GLUCOSE 97 97  BUN 72* 53*  CREATININE 6.26* 5.08*  CALCIUM 8.1* 8.0*   No results for input(s): PTH in the last 72 hours. Iron Studies:  Recent Labs  12/28/14 1633  IRON 21*  TIBC 244*    Studies/Results: No results found.  I have reviewed the patient's current medications.  Assessment/Plan: 1 CKD5 did well on Hd, will plan Fri 2 Anemia Fe and esa 3 HPTH vit D 4 Pneu will recheck CXR 5 DM controlled 6 CAD per cards 7 Liver dz P CXR, HD on Fri, esa, Fe    LOS: 2 days   Agastya Meister L 12/30/2014,8:09 AM

## 2014-12-30 NOTE — Progress Notes (Signed)
ANTIBIOTIC CONSULT NOTE - FOLLOW UP  Pharmacy Consult for Levaquin Indication: pneumonia  Allergies  Allergen Reactions  . Sulfa Antibiotics Itching    hives  . Morphine And Related     Makes patient feel weird    Patient Measurements: Height: 5' 10.5" (179.1 cm) Weight: 194 lb 7.1 oz (88.2 kg) IBW/kg (Calculated) : 74.15  Vital Signs: Temp: 97.3 F (36.3 C) (11/23 0820) Temp Source: Oral (11/23 0820) BP: 132/73 mmHg (11/23 0811) Pulse Rate: 72 (11/23 0811) Intake/Output from previous day: 11/22 0701 - 11/23 0700 In: 500 [P.O.:240; IV Piggyback:260] Out: 1000  Intake/Output from this shift: Total I/O In: 220 [P.O.:220] Out: -   Labs:  Recent Labs  12/28/14 0445 12/29/14 0330 12/30/14 0435  WBC 5.1 4.6 5.0  HGB 7.7* 7.3* 7.3*  PLT 81* 90* 91*  CREATININE 5.42* 6.26* 5.08*   Estimated Creatinine Clearance: 15 mL/min (by C-G formula based on Cr of 5.08).  Microbiology: Recent Results (from the past 720 hour(s))  Culture, blood (routine x 2) Call MD if unable to obtain prior to antibiotics being given     Status: None   Collection Time: 12/23/14  3:54 PM  Result Value Ref Range Status   Specimen Description BLOOD RIGHT ASSIST CONTROL  Final   Special Requests   Final    BOTTLES DRAWN AEROBIC AND ANAEROBIC [place] ANA   Culture NO GROWTH 7 DAYS  Final   Report Status 12/30/2014 FINAL  Final  Culture, blood (routine x 2) Call MD if unable to obtain prior to antibiotics being given     Status: None   Collection Time: 12/23/14  5:24 PM  Result Value Ref Range Status   Specimen Description BLOOD RIGHT HAND  Final   Special Requests BOTTLES DRAWN AEROBIC AND ANAEROBIC 4CC  Final   Culture NO GROWTH 7 DAYS  Final   Report Status 12/30/2014 FINAL  Final  Culture, sputum-assessment     Status: None   Collection Time: 12/24/14 12:38 PM  Result Value Ref Range Status   Specimen Description EXPECTORATED SPUTUM  Final   Special Requests NONE  Final   Sputum  evaluation THIS SPECIMEN IS ACCEPTABLE FOR SPUTUM CULTURE  Final   Report Status 12/24/2014 FINAL  Final  Culture, respiratory (NON-Expectorated)     Status: None   Collection Time: 12/24/14 12:38 PM  Result Value Ref Range Status   Specimen Description EXPECTORATED SPUTUM  Final   Special Requests NONE Reflexed from [id number]  Final   Gram Stain   Final    GOOD SPECIMEN - 80-90% WBCS MANY WBC SEEN MODERATE GRAM POSITIVE RODS FEW GRAM POSITIVE COCCI    Culture LIGHT GROWTH STAPHYLOCOCCUS AUREUS  Final   Report Status 12/28/2014 FINAL  Final   Organism ID, Bacteria STAPHYLOCOCCUS AUREUS  Final      Susceptibility   Staphylococcus aureus - MIC*    CIPROFLOXACIN <=0.5 SENSITIVE Sensitive     GENTAMICIN <=0.5 SENSITIVE Sensitive     OXACILLIN 0.5 SENSITIVE Sensitive     TRIMETH/SULFA <=10 SENSITIVE Sensitive     CEFOXITIN SCREEN NEGATIVE Sensitive     Inducible Clindamycin NEGATIVE Sensitive     ERYTHROMYCIN Value in next row Sensitive      SENSITIVE0.5    TETRACYCLINE Value in next row Sensitive      SENSITIVE<=1    CLINDAMYCIN Value in next row Sensitive      SENSITIVE<=0.25    LEVOFLOXACIN Value in next row Sensitive  SENSITIVE0.25    LINEZOLID Value in next row Sensitive      SENSITIVE2    * LIGHT GROWTH STAPHYLOCOCCUS AUREUS  MRSA PCR Screening     Status: None   Collection Time: 12/27/14  1:52 AM  Result Value Ref Range Status   MRSA by PCR NEGATIVE NEGATIVE Final    Comment:        The GeneXpert MRSA Assay (FDA approved for NASAL specimens only), is one component of a comprehensive MRSA colonization surveillance program. It is not intended to diagnose MRSA infection nor to guide or monitor treatment for MRSA infections.   C difficile quick scan w PCR reflex     Status: None   Collection Time: 12/29/14 11:28 AM  Result Value Ref Range Status   C Diff antigen NEGATIVE NEGATIVE Final   C Diff toxin NEGATIVE NEGATIVE Final   C Diff interpretation Negative for  toxigenic C. difficile  Final   Assessment: 69 y/o male who was on vancomycin and Zosyn for PNA to transition back to Levaquin. 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 Dose is the same for CKD and HD. He is afebrile and WBC are normal.   Plan:  Levaquin 750 mg PO once then 500 mg PO q48h Consider d/c antibiotics on 11/27 to complete 8 days Pharmacy signing off, please re-consult if needed  [facility] [place], [place].D., BCPS Clinical Pharmacist Pager: [phone] 12/30/2014 9:19 AM

## 2014-12-30 NOTE — Progress Notes (Signed)
Patient has arrived on 6E 17.

## 2014-12-31 DIAGNOSIS — I519 Heart disease, unspecified: Secondary | ICD-10-CM

## 2014-12-31 DIAGNOSIS — I25708 Atherosclerosis of coronary artery bypass graft(s), unspecified, with other forms of angina pectoris: Secondary | ICD-10-CM

## 2014-12-31 LAB — BASIC METABOLIC PANEL
Anion gap: 12 (ref 5–15)
BUN: 56 mg/dL — ABNORMAL HIGH (ref 6–20)
CALCIUM: 8.1 mg/dL — AB (ref 8.9–10.3)
CO2: 23 mmol/L (ref 22–32)
CREATININE: 5.57 mg/dL — AB (ref 0.61–1.24)
Chloride: 105 mmol/L (ref 101–111)
GFR calc non Af Amer: 10 mL/min — ABNORMAL LOW (ref >60)
GFR, EST AFRICAN AMERICAN: 11 mL/min — AB (ref >60)
Glucose, Bld: 92 mg/dL (ref 65–99)
Potassium: 3.5 mmol/L (ref 3.5–5.1)
SODIUM: 140 mmol/L (ref 135–145)

## 2014-12-31 LAB — GLUCOSE, CAPILLARY
GLUCOSE-CAPILLARY: 101 mg/dL — AB (ref 65–99)
GLUCOSE-CAPILLARY: 179 mg/dL — AB (ref 65–99)
Glucose-Capillary: 148 mg/dL — ABNORMAL HIGH (ref 65–99)
Glucose-Capillary: 139 mg/dL — ABNORMAL HIGH (ref 65–99)

## 2014-12-31 LAB — CBC
HCT: 23.7 % — ABNORMAL LOW (ref 39.0–52.0)
HEMOGLOBIN: 7.8 g/dL — AB (ref 13.0–17.0)
MCH: 29.3 pg (ref 26.0–34.0)
MCHC: 32.9 g/dL (ref 30.0–36.0)
MCV: 89.1 fL (ref 78.0–100.0)
Platelets: 105 10*3/uL — ABNORMAL LOW (ref 150–400)
RBC: 2.66 MIL/uL — ABNORMAL LOW (ref 4.22–5.81)
RDW: 15.6 % — AB (ref 11.5–15.5)
WBC: 6 10*3/uL (ref 4.0–10.5)

## 2014-12-31 MED ORDER — PREGABALIN 75 MG PO CAPS: 75.0000 mg | ORAL_CAPSULE | Freq: Every day | ORAL | Status: DC

## 2014-12-31 MED ORDER — PREGABALIN 75 MG PO CAPS
75.0000 mg | ORAL_CAPSULE | Freq: Two times a day (BID) | ORAL | Status: DC
  Administered 2014-12-31 – 2015-01-03 (×7): 75 mg via ORAL
  Filled 2014-12-31 (×7): qty 1

## 2014-12-31 MED ORDER — SODIUM CHLORIDE 0.9 % IJ SOLN
10.0000 mL | INTRAMUSCULAR | Status: DC | PRN
  Administered 2014-12-31 – 2015-01-01 (×2): 20 mL
  Administered 2015-01-02 (×2): 10 mL
  Administered 2015-01-02: 20 mL
  Administered 2015-01-02: 10 mL
  Filled 2014-12-31 (×5): qty 40

## 2014-12-31 NOTE — Progress Notes (Signed)
SUBJECTIVE: No chest pain. Says breathing has improved. Wants to know when he'll be discharged.     Intake/Output Summary (Last 24 hours) at 12/31/14 0854 Last data filed at 12/31/14 0241  Gross per 24 hour  Intake   1170 ml  Output      0 ml  Net   1170 ml    Current Facility-Administered Medications  Medication Dose Route Frequency Provider Last Rate Last Dose  . 0.9 %  sodium chloride infusion  250 mL Intravenous PRN Radene Gunning, NP      . acetaminophen (TYLENOL) tablet 650 mg  650 mg Oral Q6H PRN Radene Gunning, NP       Or  . acetaminophen (TYLENOL) suppository 650 mg  650 mg Rectal Q6H PRN Radene Gunning, NP      . ALPRAZolam Duanne Moron) tablet 0.5 mg  0.5 mg Oral PRN Radene Gunning, NP      . aspirin EC tablet 81 mg  81 mg Oral Daily Brett Canales, PA-C   81 mg at 12/30/14 0943  . calcitRIOL (ROCALTROL) capsule 0.25 mcg  0.25 mcg Oral Daily Lezlie Octave Black, NP   0.25 mcg at 12/30/14 0943  . carvedilol (COREG) tablet 3.125 mg  3.125 mg Oral BID Mauricia Area, MD   3.125 mg at 12/30/14 2135  . Darbepoetin Alfa (ARANESP) injection 200 mcg  200 mcg Subcutaneous Q Mon-HD Skeet Simmer, RPH   200 mcg at 12/28/14 [id number]  . dextromethorphan-guaiFENesin (MUCINEX DM) 30-600 MG per 12 hr tablet 1 tablet  1 tablet Oral BID Radene Gunning, NP   1 tablet at 12/30/14 2136  . diphenhydrAMINE (BENADRYL) capsule 25 mg  25 mg Oral QHS PRN Gardiner Barefoot, NP   25 mg at 12/29/14 2208  . feeding supplement (NEPRO CARB STEADY) liquid 237 mL  237 mL Oral BID BM Ripudeep K Rai, MD   237 mL at 12/30/14 0944  . ferric gluconate (NULECIT) 125 mg in sodium chloride 0.9 % 100 mL IVPB  125 mg Intravenous Daily Mauricia Area, MD   125 mg at 12/30/14 1011  . HYDROcodone-acetaminophen (NORCO/VICODIN) 5-325 MG per tablet 1 tablet  1 tablet Oral Q6H PRN Nita Sells, MD   1 tablet at 12/30/14 2135  . insulin aspart (novoLOG) injection 0-15 Units  0-15 Units Subcutaneous TID WC Radene Gunning, NP    3 Units at 12/30/14 1138  . insulin aspart (novoLOG) injection 0-5 Units  0-5 Units Subcutaneous QHS Radene Gunning, NP   2 Units at 12/30/14 2142  . insulin glargine (LANTUS) injection 10 Units  10 Units Subcutaneous QHS Radene Gunning, NP   10 Units at 12/30/14 2143  . isosorbide mononitrate (IMDUR) 24 hr tablet 90 mg  90 mg Oral QHS Mauricia Area, MD   90 mg at 12/30/14 2135  . [START ON 01/01/2015] levofloxacin (LEVAQUIN) tablet 500 mg  500 mg Oral Q48H Donalynn Furlong Enoch, [facility]      . multivitamin (RENA-VIT) tablet 1 tablet  1 tablet Oral QHS Mauricia Area, MD   1 tablet at 12/30/14 2135  . nitroGLYCERIN (NITROLINGUAL) 0.4 MG/SPRAY spray 1 spray  1 spray Sublingual Q5 Min x 3 PRN Radene Gunning, NP      . ondansetron [facility]) tablet 4 mg  4 mg Oral Q6H PRN Radene Gunning, NP       Or  . ondansetron [facility]) injection 4 mg  4 mg Intravenous  Q6H PRN Lezlie Octave Black, NP      . pneumococcal 23 valent vaccine (PNU-IMMUNE) injection 0.5 mL  0.5 mL Intramuscular Prior to discharge Debbe Odea, MD      . pregabalin (LYRICA) capsule 75 mg  75 mg Oral BID Radene Gunning, NP   75 mg at 12/30/14 2136  . ranolazine (RANEXA) 12 hr tablet 1,000 mg  1,000 mg Oral BID Radene Gunning, NP   1,000 mg at 12/30/14 2135  . rosuvastatin (CRESTOR) tablet 10 mg  10 mg Oral QHS Mauricia Area, MD   10 mg at 12/30/14 2135  . sodium chloride 0.9 % injection 3 mL  3 mL Intravenous Q12H Radene Gunning, NP   3 mL at 12/30/14 0943  . sodium chloride 0.9 % injection 3 mL  3 mL Intravenous Q12H Radene Gunning, NP   3 mL at 12/30/14 0948  . sodium chloride 0.9 % injection 3 mL  3 mL Intravenous PRN Radene Gunning, NP      . ticagrelor [facility]) tablet 90 mg  90 mg Oral BID Brett Canales, PA-C   90 mg at 12/30/14 2136    Filed Vitals:   12/30/14 1630 12/30/14 2116 12/31/14 0449 12/31/14 0742  BP: 143/65 147/61 132/80 142/63  Pulse: 70 84 75 73  Temp: 97.6 F (36.4 C) 98.3 F (36.8 C) 98.5 F (36.9 C) 98.5 F (36.9 C)    TempSrc: Oral Oral Oral Oral  Resp: 18 17 18 17   Height:      Weight:      SpO2: 94% 94% 97% 96%    PHYSICAL EXAM General: NAD HEENT: Normal. Neck: No JVD, no thyromegaly.  Lungs: Clear to auscultation bilaterally with normal respiratory effort. CV: Nondisplaced PMI.  Regular rate and rhythm, normal S1/S2, no S3/S4, no murmur.  No pretibial edema.    Abdomen: Soft, nontender, no distention.  Neurologic: Alert and oriented x 3.  Psych: Normal affect. Musculoskeletal: No gross deformities. Extremities: No clubbing or cyanosis.    LABS: Basic Metabolic Panel:  Recent Labs  12/30/14 0435 12/31/14 0329  NA 141 140  K 3.8 3.5  CL 105 105  CO2 26 23  GLUCOSE 97 92  BUN 53* 56*  CREATININE 5.08* 5.57*  CALCIUM 8.0* 8.1*   Liver Function Tests: No results for input(s): AST, ALT, ALKPHOS, BILITOT, PROT, ALBUMIN in the last 72 hours. No results for input(s): LIPASE, AMYLASE in the last 72 hours. CBC:  Recent Labs  12/30/14 0435 12/31/14 0329  WBC 5.0 6.0  HGB 7.3* 7.8*  HCT 22.9* 23.7*  MCV 86.7 89.1  PLT 91* 105*   Cardiac Enzymes: No results for input(s): CKTOTAL, CKMB, CKMBINDEX, TROPONINI in the last 72 hours. BNP: Invalid input(s): POCBNP D-Dimer: No results for input(s): DDIMER in the last 72 hours. Hemoglobin A1C: No results for input(s): HGBA1C in the last 72 hours. Fasting Lipid Panel: No results for input(s): CHOL, HDL, LDLCALC, TRIG, CHOLHDL, LDLDIRECT in the last 72 hours. Thyroid Function Tests: No results for input(s): TSH, T4TOTAL, T3FREE, THYROIDAB in the last 72 hours.  Invalid input(s): FREET3 Anemia Panel:  Recent Labs  12/28/14 1633  TIBC 244*  IRON 21*    RADIOLOGY: Dg Chest 2 View  12/30/2014  CLINICAL DATA:  Pneumonia EXAM: CHEST  2 VIEW COMPARISON:  12/27/2014 FINDINGS: Cardiomediastinal silhouette is stable. Status post CABG. Left IJ central line is unchanged in position. There is streaky right infrahilar atelectasis,  infiltrate or bronchitic changes. No  pulmonary edema. Degenerative changes thoracic spine. IMPRESSION: Streaky right infrahilar atelectasis, infiltrate or bronchitic changes. No pulmonary edema. Degenerative changes thoracic spine. Electronically Signed   By: Lahoma Crocker M.D.   On: 12/30/2014 10:59   Dg Chest 2 View  12/23/2014  CLINICAL DATA:  Syncopal episode this morning, fell several times, cough, CHF, hypertension, diabetes mellitus, former smoker EXAM: CHEST  2 VIEW COMPARISON:  11/03/2014 FINDINGS: Enlargement of cardiac silhouette post CABG. Mediastinal contours and pulmonary vascularity normal. RIGHT basilar infiltrate consistent with pneumonia. Remaining lungs clear. No pleural effusion or pneumothorax. IMPRESSION: Enlargement of cardiac silhouette post CABG. RIGHT basilar infiltrate consistent with pneumonia. Electronically Signed   By: Lavonia Dana M.D.   On: 12/23/2014 10:49   Ct Chest Wo Contrast  12/27/2014  CLINICAL DATA:  Shortness of breath. Prostate cancer. CABG. Former smoker. EXAM: CT CHEST WITHOUT CONTRAST TECHNIQUE: Multidetector CT imaging of the chest was performed following the standard protocol without IV contrast. COMPARISON:  03/04/2013 PET-CT.  12/23/2014 chest radiograph. FINDINGS: Images are motion degraded. Mediastinum/Nodes: Stable mild cardiomegaly. No pericardial fluid/thickening. Left main, left anterior descending, left circumflex and right coronary atherosclerosis, with ascending aortic and left internal mammary bypass grafts. Normal caliber thoracic aorta. Stable dilation of the main pulmonary artery (3.4 cm diameter). Normal visualized thyroid. Normal esophagus. No axillary adenopathy. Mildly enlarged 1.1 cm right paratracheal node (series 2/ image 13), previously 0.9 cm . Mildly enlarged 1.1 cm subcarinal node (2/26), increased from 0.9 cm. No gross hilar adenopathy on this noncontrast study. Lungs/Pleura: No pneumothorax. No pleural effusion. There is mild  centrilobular emphysema. There are extensive patchy tree-in-bud opacities, patchy centrilobular nodular opacities and patchy peribronchovascular ground-glass opacity throughout both lungs, most prominent in the right upper, right middle and right lower lobes. Upper abdomen: Unremarkable. Musculoskeletal: No aggressive appearing focal osseous lesions. Median sternotomy wires are intact. Marked degenerative changes in the thoracic spine. IMPRESSION: 1. Extensive patchy tree-in-bud opacities, patchy centrilobular nodular opacities and patchy peribronchovascular ground-glass opacities throughout both lungs, asymmetrically involving the lobes of the right lung. These findings are most in keeping with an infectious bronchiolitis/developing bronchopneumonia, with the differential including bacterial, fungal or tuberculosis infections. 2. New mild mediastinal lymphadenopathy, nonspecific, likely reactive. 3. Mild centrilobular emphysema. 4. Left main and 3 vessel coronary atherosclerosis status post CABG. 5. Stable dilated main pulmonary artery, suggesting chronic pulmonary arterial hypertension. Electronically Signed   By: Ilona Sorrel M.D.   On: 12/27/2014 10:44   Dg Chest Port 1 View  12/27/2014  CLINICAL DATA:  Patient status post PICC line placement. Interval re- adjustment. EXAM: PORTABLE CHEST 1 VIEW COMPARISON:  Earlier same day FINDINGS: Interval re- adjustment PICC line with tip projecting over the superior vena cava. Stable cardiomegaly status post median sternotomy and CABG procedure. Unchanged heterogeneous opacities right lung base most compatible with pneumonia. No pleural effusion or pneumothorax. IMPRESSION: Interval re- adjustment PICC line with tip projecting over the superior vena cava. Persistent heterogeneous opacities right lung base favored to represent pneumonia. Cardiomegaly. Electronically Signed   By: Lovey Newcomer M.D.   On: 12/27/2014 16:20   Dg Chest Port 1 View  12/27/2014  CLINICAL  DATA:  Patient status post PICC line placement. EXAM: PORTABLE CHEST 1 VIEW COMPARISON:  Chest radiograph 12/23/2014. FINDINGS: Interval insertion of PICC line with tip projecting over the expected location of the central left brachiocephalic vein near the confluence with the superior vena cava. Stable cardiac and mediastinal contours, enlarged. Pulmonary vascular redistribution. No frank pulmonary edema. Re- demonstrated  heterogeneous opacities within the right mid lower lung. No pleural effusion or pneumothorax. IMPRESSION: New PICC line tip projects at the confluence of the left brachiocephalic vein and superior vena cava. Consider repositioning. Cardiomegaly and pulmonary vascular redistribution. Re- demonstrated right mid and lower lung heterogeneous opacities most compatible with pneumonia. Electronically Signed   By: Lovey Newcomer M.D.   On: 12/27/2014 16:12      ASSESSMENT AND PLAN: 1. CAD s/p CABG and PCI of left main into LCx: Continue dual antiplatelet therapy with ASA and Brilinta. Symptomatically stable. Continue Crestor, Coreg, Imdur, and Ranexa.  2. Chronic systolic heart failure: Stable. No pulmonary edema by CXR on 11/23. Volume management per dialysis.  3. RLL pneumonia: WBC normal. On oral levofloxacin.  4. ESRD on HD  Dispo: No further recommendations.   Kate Sable, M.D., F.A.C.C.

## 2014-12-31 NOTE — Progress Notes (Signed)
Subjective: Interval History: has complaints wants to get home.  Objective: Vital signs in last 24 hours: Temp:  [97.5 F (36.4 C)-98.5 F (36.9 C)] 98.5 F (36.9 C) (11/24 0742) Pulse Rate:  [68-84] 73 (11/24 0742) Resp:  [17-18] 17 (11/24 0742) BP: (117-147)/(58-80) 142/63 mmHg (11/24 0742) SpO2:  [93 %-97 %] 96 % (11/24 0742) Weight change:   Intake/Output from previous day: 11/23 0701 - 11/24 0700 In: 1390 [P.O.:1280; IV Piggyback:110] Out: 0  Intake/Output this shift: Total I/O In: 240 [P.O.:240] Out: -   General appearance: alert, cooperative and no distress Resp: diminished breath sounds bilaterally, rales bibasilar, rhonchi bibasilar and RUL and wheezes RUL Cardio: S1, S2 normal and systolic murmur: holosystolic 2/6, blowing at apex GI: pos bs, liver down 4 cm, soft Extremities: AVF LLA  Lab Results:  Recent Labs  12/30/14 0435 12/31/14 0329  WBC 5.0 6.0  HGB 7.3* 7.8*  HCT 22.9* 23.7*  PLT 91* 105*   BMET:  Recent Labs  12/30/14 0435 12/31/14 0329  NA 141 140  K 3.8 3.5  CL 105 105  CO2 26 23  GLUCOSE 97 92  BUN 53* 56*  CREATININE 5.08* 5.57*  CALCIUM 8.0* 8.1*   No results for input(s): PTH in the last 72 hours. Iron Studies:  Recent Labs  12/28/14 1633  IRON 21*  TIBC 244*    Studies/Results: Dg Chest 2 View  12/30/2014  CLINICAL DATA:  Pneumonia EXAM: CHEST  2 VIEW COMPARISON:  12/27/2014 FINDINGS: Cardiomediastinal silhouette is stable. Status post CABG. Left IJ central line is unchanged in position. There is streaky right infrahilar atelectasis, infiltrate or bronchitic changes. No pulmonary edema. Degenerative changes thoracic spine. IMPRESSION: Streaky right infrahilar atelectasis, infiltrate or bronchitic changes. No pulmonary edema. Degenerative changes thoracic spine. Electronically Signed   By: Lahoma Crocker M.D.   On: 12/30/2014 10:59    I have reviewed the patient's current medications.  Assessment/Plan: 1  ESRD for HD, need  to get CLIP to [place] 2 Anemia  On esa,fe 3 HPTH vit D,  4 CAD per cards 5 pneu resolving 6 Dm controlled 7 Prostate Ca P HD, esa, CLIP    LOS: 3 days   Rawley Harju L 12/31/2014,12:21 PM

## 2014-12-31 NOTE — Progress Notes (Signed)
Joseph [name] [id number] DOB: 1945-04-12 DOA: 12/28/2014 PCP: Marden Noble, MD  Brief narrative: 69 y/o ? 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  He was hydrated aggressively for his hypotension after being admitted.  He was placed on pressors for a short duration on 11/20.  He also received 1 U PRBC 11/19. He was previously on dialysis from 10/15 through 3/16.  He was started on dialysis again on 11/18.  ransferred to [facility] on 11/21 due to patient preference.   Past medical history-As per Problem list Chart reviewed as below-   Consultants:  renal  Procedures:  none  Antibiotics:  Levaquin 11/16-11/18  Vancomycin 11/20  Zosyn 11/20    Subjective   doing fair No fever no chills Mild shortness of breath with ambulation Tolerating diet okay    Objective    Interim History:  Telemetry: sinusbrady + PVC's   Objective: Filed Vitals:   12/30/14 1630 12/30/14 2116 12/31/14 0449 12/31/14 0742  BP: 143/65 147/61 132/80 142/63  Pulse: 70 84 75 73  Temp: 97.6 F (36.4 C) 98.3 F (36.8 C) 98.5 F (36.9 C) 98.5 F (36.9 C)  TempSrc: Oral Oral Oral Oral  Resp: 18 17 18 17   Height:      Weight:      SpO2: 94% 94% 97% 96%    Intake/Output Summary (Last 24 hours) at 12/31/14 1032 Last data filed at 12/31/14 0859  Gross per 24 hour  Intake   1300 ml  Output      0 ml  Net   1300 ml    Exam:  General: eomi ncat Cardiovascular: s1 s 2no m/r/g Respiratory: clear no added sounhd Abdomen: soft nt nd no rebound Skin LUE access covered-Has a dialysis access in L neck Neuro intact  Data  Reviewed: Basic Metabolic Panel:  Recent Labs Lab 12/26/14 1208 12/26/14 1319 12/27/14 1135 12/28/14 0445 12/29/14 0330 12/30/14 0435 12/31/14 0329  NA 142  --  140 139 140 141 140  K 4.2  --  4.2 4.1 3.9 3.8 3.5  CL 107  --  105 105 107 105 105  CO2 23  --  25 24 24 26 23   GLUCOSE 167*  --  154* 106* 97 97 92  BUN 92*  --  66* 68* 72* 53* 56*  CREATININE 5.84*  --  5.28* 5.42* 6.26* 5.08* 5.57*  CALCIUM 8.0*  --  7.9* 8.1* 8.1* 8.0* 8.1*  PHOS 5.1* 3.1  --   --   --   --   --    Liver Function Tests:  Recent Labs Lab 12/26/14 1208  ALBUMIN 2.9*   No results for input(s): LIPASE, AMYLASE in the last 168 hours. No results for input(s): AMMONIA in the last 168 hours. CBC:  Recent Labs Lab 12/27/14 1135 12/28/14 0445 12/29/14 0330 12/30/14 0435 12/31/14 0329  WBC 4.7 5.1 4.6 5.0 6.0  NEUTROABS 3.8  --   --   --   --   HGB 6.9* 7.7* 7.3* 7.3* 7.8*  HCT 20.7* 22.7* 22.4* 22.9* 23.7*  MCV 84.8 85.1 86.5 86.7 89.1  PLT 89* 81* 90* 91* 105*   Cardiac Enzymes:  Recent Labs Lab  12/25/14 0756  TROPONINI 0.06*   BNP: Invalid input(s): POCBNP CBG:  Recent Labs Lab 12/30/14 1138 12/30/14 1232 12/30/14 1630 12/30/14 2112 12/31/14 0733  GLUCAP 157* 149* 91 203* 101*    Recent Results (from the past 240 hour(s))  Culture, blood (routine x 2) Call MD if unable to obtain prior to antibiotics being given     Status: None   Collection Time: 12/23/14  3:54 PM  Result Value Ref Range Status   Specimen Description BLOOD RIGHT ASSIST CONTROL  Final   Special Requests   Final    BOTTLES DRAWN AEROBIC AND ANAEROBIC [place] ANA   Culture NO GROWTH 7 DAYS  Final   Report Status 12/30/2014 FINAL  Final  Culture, blood (routine x 2) Call MD if unable to obtain prior to antibiotics being given     Status: None   Collection Time: 12/23/14  5:24 PM  Result Value Ref Range Status   Specimen Description BLOOD RIGHT HAND  Final   Special Requests BOTTLES DRAWN AEROBIC  AND ANAEROBIC 4CC  Final   Culture NO GROWTH 7 DAYS  Final   Report Status 12/30/2014 FINAL  Final  Culture, sputum-assessment     Status: None   Collection Time: 12/24/14 12:38 PM  Result Value Ref Range Status   Specimen Description EXPECTORATED SPUTUM  Final   Special Requests NONE  Final   Sputum evaluation THIS SPECIMEN IS ACCEPTABLE FOR SPUTUM CULTURE  Final   Report Status 12/24/2014 FINAL  Final  Culture, respiratory (NON-Expectorated)     Status: None   Collection Time: 12/24/14 12:38 PM  Result Value Ref Range Status   Specimen Description EXPECTORATED SPUTUM  Final   Special Requests NONE Reflexed from [id number]  Final   Gram Stain   Final    GOOD SPECIMEN - 80-90% WBCS MANY WBC SEEN MODERATE GRAM POSITIVE RODS FEW GRAM POSITIVE COCCI    Culture LIGHT GROWTH STAPHYLOCOCCUS AUREUS  Final   Report Status 12/28/2014 FINAL  Final   Organism ID, Bacteria STAPHYLOCOCCUS AUREUS  Final      Susceptibility   Staphylococcus aureus - MIC*    CIPROFLOXACIN <=0.5 SENSITIVE Sensitive     GENTAMICIN <=0.5 SENSITIVE Sensitive     OXACILLIN 0.5 SENSITIVE Sensitive     TRIMETH/SULFA <=10 SENSITIVE Sensitive     CEFOXITIN SCREEN NEGATIVE Sensitive     Inducible Clindamycin NEGATIVE Sensitive     ERYTHROMYCIN Value in next row Sensitive      SENSITIVE0.5    TETRACYCLINE Value in next row Sensitive      SENSITIVE<=1    CLINDAMYCIN Value in next row Sensitive      SENSITIVE<=0.25    LEVOFLOXACIN Value in next row Sensitive      SENSITIVE0.25    LINEZOLID Value in next row Sensitive      SENSITIVE2    * LIGHT GROWTH STAPHYLOCOCCUS AUREUS  MRSA PCR Screening     Status: None   Collection Time: 12/27/14  1:52 AM  Result Value Ref Range Status   MRSA by PCR NEGATIVE NEGATIVE Final    Comment:        The GeneXpert MRSA Assay (FDA approved for NASAL specimens only), is one component of a comprehensive MRSA colonization surveillance program. It is not intended to diagnose  MRSA infection nor to guide or monitor treatment for MRSA infections.   C difficile quick scan w PCR reflex     Status: None   Collection Time: 12/29/14 11:28 AM  Result Value Ref Range Status   C Diff antigen NEGATIVE NEGATIVE Final   C Diff toxin NEGATIVE NEGATIVE Final   C Diff interpretation Negative for toxigenic C. difficile  Final     Studies:              All Imaging reviewed and is as per above notation   Scheduled Meds: . aspirin EC  81 mg Oral Daily  . calcitRIOL  0.25 mcg Oral Daily  . carvedilol  3.125 mg Oral BID  . darbepoetin (ARANESP) injection - DIALYSIS  200 mcg Subcutaneous Q Mon-HD  . dextromethorphan-guaiFENesin  1 tablet Oral BID  . feeding supplement (NEPRO CARB STEADY)  237 mL Oral BID BM  . ferric gluconate (FERRLECIT/NULECIT) IV  125 mg Intravenous Daily  . insulin aspart  0-15 Units Subcutaneous TID WC  . insulin aspart  0-5 Units Subcutaneous QHS  . insulin glargine  10 Units Subcutaneous QHS  . isosorbide mononitrate  90 mg Oral QHS  . [START ON 01/01/2015] levofloxacin  500 mg Oral Q48H  . multivitamin  1 tablet Oral QHS  . pregabalin  75 mg Oral BID  . ranolazine  1,000 mg Oral BID  . rosuvastatin  10 mg Oral QHS  . sodium chloride  3 mL Intravenous Q12H  . sodium chloride  3 mL Intravenous Q12H  . 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- dialysis resumed - being followed by renal team- dialysis to occur next on  11/25 -awaiting clip for dialysis in [place] at present  CAD s/p CABG PCI/DES July 2016 - cont Brilinta and ASA- needs dual antiplatelet therapy indefinitely per cardiology - on Imdur and Ranexa 1000 bid  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 - fluid balance per dialysis, cont Coreg 3.125 bud, Imdur 90 QHS -Dialysis 11/25   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 Next dialysis 11/25.   Verneita Griffes, MD  [facility] Pager [phone] 12/31/2014, 10:32 AM    LOS: 3 days

## 2015-01-01 DIAGNOSIS — I25768 Atherosclerosis of bypass graft of coronary artery of transplanted heart with other forms of angina pectoris: Secondary | ICD-10-CM

## 2015-01-01 LAB — RENAL FUNCTION PANEL
ALBUMIN: 2.8 g/dL — AB (ref 3.5–5.0)
ANION GAP: 10 (ref 5–15)
BUN: 49 mg/dL — ABNORMAL HIGH (ref 6–20)
CHLORIDE: 107 mmol/L (ref 101–111)
CO2: 23 mmol/L (ref 22–32)
Calcium: 8.6 mg/dL — ABNORMAL LOW (ref 8.9–10.3)
Creatinine, Ser: 5.44 mg/dL — ABNORMAL HIGH (ref 0.61–1.24)
GFR, EST AFRICAN AMERICAN: 11 mL/min — AB (ref >60)
GFR, EST NON AFRICAN AMERICAN: 10 mL/min — AB (ref >60)
Glucose, Bld: 194 mg/dL — ABNORMAL HIGH (ref 65–99)
PHOSPHORUS: 5.1 mg/dL — AB (ref 2.5–4.6)
POTASSIUM: 3.6 mmol/L (ref 3.5–5.1)
Sodium: 140 mmol/L (ref 135–145)

## 2015-01-01 LAB — CBC
HEMATOCRIT: 23.2 % — AB (ref 39.0–52.0)
HEMOGLOBIN: 8.4 g/dL — AB (ref 13.0–17.0)
MCH: 32.9 pg (ref 26.0–34.0)
MCHC: 36.2 g/dL — ABNORMAL HIGH (ref 30.0–36.0)
MCV: 91 fL (ref 78.0–100.0)
Platelets: 108 10*3/uL — ABNORMAL LOW (ref 150–400)
RBC: 2.55 MIL/uL — AB (ref 4.22–5.81)
RDW: 15.7 % — ABNORMAL HIGH (ref 11.5–15.5)
WBC: 6.2 10*3/uL (ref 4.0–10.5)

## 2015-01-01 LAB — GLUCOSE, CAPILLARY
GLUCOSE-CAPILLARY: 161 mg/dL — AB (ref 65–99)
Glucose-Capillary: 124 mg/dL — ABNORMAL HIGH (ref 65–99)
Glucose-Capillary: 134 mg/dL — ABNORMAL HIGH (ref 65–99)

## 2015-01-01 LAB — TROPONIN I: TROPONIN I: 0.6 ng/mL — AB (ref <0.031)

## 2015-01-01 MED ORDER — SODIUM CHLORIDE 0.9 % IV SOLN: 100.0000 mL | INTRAVENOUS | Status: DC | PRN

## 2015-01-01 MED ORDER — LIDOCAINE HCL (PF) 1 % IJ SOLN: 5.0000 mL | INTRAMUSCULAR | Status: DC | PRN

## 2015-01-01 MED ORDER — LIDOCAINE-PRILOCAINE 2.5-2.5 % EX CREA: 1.0000 "application " | TOPICAL_CREAM | CUTANEOUS | Status: DC | PRN

## 2015-01-01 MED ORDER — HEPARIN SODIUM (PORCINE) 1000 UNIT/ML DIALYSIS: 1000.0000 [IU] | INTRAMUSCULAR | Status: DC | PRN

## 2015-01-01 MED ORDER — HEPARIN SODIUM (PORCINE) 1000 UNIT/ML DIALYSIS
100.0000 [IU]/kg | INTRAMUSCULAR | Status: DC | PRN
  Administered 2015-01-01: 8800 [IU] via INTRAVENOUS_CENTRAL
  Filled 2015-01-01 (×2): qty 9

## 2015-01-01 MED ORDER — DIPHENHYDRAMINE HCL 25 MG PO CAPS
25.0000 mg | ORAL_CAPSULE | Freq: Once | ORAL | Status: AC
  Administered 2015-01-01: 25 mg via ORAL

## 2015-01-01 MED ORDER — DIPHENHYDRAMINE HCL 25 MG PO CAPS
ORAL_CAPSULE | ORAL | Status: AC
  Filled 2015-01-01: qty 1

## 2015-01-01 MED ORDER — ALTEPLASE 2 MG IJ SOLR
2.0000 mg | Freq: Once | INTRAMUSCULAR | Status: DC | PRN
  Filled 2015-01-01: qty 2

## 2015-01-01 MED ORDER — PENTAFLUOROPROP-TETRAFLUOROETH EX AERO: 1.0000 "application " | INHALATION_SPRAY | CUTANEOUS | Status: DC | PRN

## 2015-01-01 NOTE — Progress Notes (Signed)
Patient complained of chest pain. Was given a spray of nitroglycerin 2 times, 5 minutes apart. Chest paine resolved after the second spray. EKG was done and MD Samtani was notified. Cardiac labs were ordered and repeat EKG in the am. Patient placed on telemetry. Will continue to monitor for patient safety.

## 2015-01-01 NOTE — Progress Notes (Signed)
Subjective: Interval History: has no complaint other than wants to go home.  Objective: Vital signs in last 24 hours: Temp:  [97.7 F (36.5 C)-98.3 F (36.8 C)] 98.2 F (36.8 C) (11/25 0450) Pulse Rate:  [69-76] 74 (11/25 0450) Resp:  [16-18] 17 (11/25 0450) BP: (143-166)/(66-82) 143/73 mmHg (11/25 0450) SpO2:  [94 %-98 %] 94 % (11/24 2000) Weight:  [87.59 kg (193 lb 1.6 oz)] 87.59 kg (193 lb 1.6 oz) (11/24 2000) Weight change:   Intake/Output from previous day: 11/24 0701 - 11/25 0700 In: 620 [P.O.:600; I.V.:20] Out: 200 [Urine:200] Intake/Output this shift:    General appearance: alert, cooperative and no distress Resp: diminished breath sounds bilaterally, rales RLL and rhonchi RLL Cardio: S1, S2 normal and systolic murmur: holosystolic 2/6, blowing at apex GI: pos bs, liver down 4 cm soft Extremities: AVF LLA B&T  Lab Results:  Recent Labs  12/30/14 0435 12/31/14 0329  WBC 5.0 6.0  HGB 7.3* 7.8*  HCT 22.9* 23.7*  PLT 91* 105*   BMET:  Recent Labs  12/30/14 0435 12/31/14 0329  NA 141 140  K 3.8 3.5  CL 105 105  CO2 26 23  GLUCOSE 97 92  BUN 53* 56*  CREATININE 5.08* 5.57*  CALCIUM 8.0* 8.1*   No results for input(s): PTH in the last 72 hours. Iron Studies: No results for input(s): IRON, TIBC, TRANSFERRIN, FERRITIN in the last 72 hours.  Studies/Results: Dg Chest 2 View  12/30/2014  CLINICAL DATA:  Pneumonia EXAM: CHEST  2 VIEW COMPARISON:  12/27/2014 FINDINGS: Cardiomediastinal silhouette is stable. Status post CABG. Left IJ central line is unchanged in position. There is streaky right infrahilar atelectasis, infiltrate or bronchitic changes. No pulmonary edema. Degenerative changes thoracic spine. IMPRESSION: Streaky right infrahilar atelectasis, infiltrate or bronchitic changes. No pulmonary edema. Degenerative changes thoracic spine. Electronically Signed   By: Lahoma Crocker M.D.   On: 12/30/2014 10:59    I have reviewed the patient's current  medications.  Assessment/Plan: 1 ESRD for HD.  Will CLIP 2 DM controlled 3 Anemia on esa, Fe 4 HPTH meds 5 CAD per Cards 6 Pneu on AB P HD, esa, CLIP    LOS: 4 days   Saveah Bahar L 01/01/2015,9:19 AM

## 2015-01-01 NOTE — Procedures (Signed)
I was present at this session.  I have reviewed the session itself and made appropriate changes.  HD via LLA avf.  bp low 100s.    Joseph [name] L 11/25/20169:19 AM

## 2015-01-01 NOTE — Progress Notes (Signed)
CRITICAL VALUE ALERT  Critical value received:  Troponin 0.60  Date of notification:  01/01/2015  Time of notification:  2000  Critical value read back: Yes  Nurse who received alert:  Everardo All RN  MD notified (1st page):  Dr. Donnal Debar  Time of first page:  2005  MD notified (2nd page):  Time of second page:  Responding MD:  Dr. Donnal Debar  Time MD responded:  2007

## 2015-01-01 NOTE — Progress Notes (Signed)
Critical troponin of 0.60 called up for patient at 2000, Dr. Donnal Debar notified and aware, will continue to cycle troponin, no other intervention at this time per Dr. Donnal Debar, patient resting comfortably in chair playing cards with family, no cardiac complaints or active chest pain at this time, BP 143/60, HR 72, patient 98% on RA, next troponin to be drawn at midnight, will continue to monitor closely.

## 2015-01-01 NOTE — Progress Notes (Signed)
Joseph [name] [id number] DOB: 1945/03/04 DOA: 12/28/2014 PCP: Marden Noble, MD  Brief narrative: 69 y/o ? 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  He was hydrated aggressively for his hypotension after being admitted.  He was placed on pressors for a short duration on 11/20.  He also received 1 U PRBC 11/19. He was previously on dialysis from 10/15 through 3/16.  He was started on dialysis again on 11/18.  transferred to [facility] on 11/21 due to patient preference.   Past medical history-As per Problem list Chart reviewed as below-   Consultants:  renal  Procedures:  none  Antibiotics:  Levaquin 11/16-11/18  Vancomycin 11/20  Zosyn 11/20    Subjective   Seen on HD unit Fair  No issue currently Feeling good tol diet No cp No sob   Objective    Interim History:  Telemetry: sinusbrady + PVC's   Objective: Filed Vitals:   01/01/15 0900 01/01/15 0930 01/01/15 1000 01/01/15 1030  BP: 151/72 148/63 126/65 136/67  Pulse: 76 67 67 70  Temp:      TempSrc:      Resp:      Height:      Weight:      SpO2:        Intake/Output Summary (Last 24 hours) at 01/01/15 1103 Last data filed at 12/31/14 1754  Gross per 24 hour  Intake    380 ml  Output    200 ml  Net    180 ml    Exam:  General: eomi ncat Cardiovascular: s1 s2 no m/r/g Respiratory: clear no added sounhd Abdomen: soft nt nd no rebound Skin LUE access covered-Has a dialysis access in L neck Neuro intact  Data Reviewed: Basic Metabolic Panel:  Recent Labs Lab 12/26/14 1208 12/26/14 1319  12/28/14 0445 12/29/14 0330  12/30/14 0435 12/31/14 0329 01/01/15 0902  NA 142  --   < > 139 140 141 140 140  K 4.2  --   < > 4.1 3.9 3.8 3.5 3.6  CL 107  --   < > 105 107 105 105 107  CO2 23  --   < > 24 24 26 23 23   GLUCOSE 167*  --   < > 106* 97 97 92 194*  BUN 92*  --   < > 68* 72* 53* 56* 49*  CREATININE 5.84*  --   < > 5.42* 6.26* 5.08* 5.57* 5.44*  CALCIUM 8.0*  --   < > 8.1* 8.1* 8.0* 8.1* 8.6*  PHOS 5.1* 3.1  --   --   --   --   --  5.1*  < > = values in this interval not displayed. Liver Function Tests:  Recent Labs Lab 12/26/14 1208 01/01/15 0902  ALBUMIN 2.9* 2.8*   No results for input(s): LIPASE, AMYLASE in the last 168 hours. No results for input(s): AMMONIA in the last 168 hours. CBC:  Recent Labs Lab 12/27/14 1135 12/28/14 0445 12/29/14 0330 12/30/14 0435 12/31/14 0329 01/01/15 0902  WBC 4.7 5.1 4.6 5.0 6.0 6.2  NEUTROABS 3.8  --   --   --   --   --   HGB 6.9* 7.7*  7.3* 7.3* 7.8* 8.4*  HCT 20.7* 22.7* 22.4* 22.9* 23.7* 23.2*  MCV 84.8 85.1 86.5 86.7 89.1 91.0  PLT 89* 81* 90* 91* 105* 108*   Cardiac Enzymes: No results for input(s): CKTOTAL, CKMB, CKMBINDEX, TROPONINI in the last 168 hours. BNP: Invalid input(s): POCBNP CBG:  Recent Labs Lab 12/31/14 0733 12/31/14 1116 12/31/14 1707 12/31/14 1955 01/01/15 0654  GLUCAP 101* 179* 148* 139* 124*    Recent Results (from the past 240 hour(s))  Culture, blood (routine x 2) Call MD if unable to obtain prior to antibiotics being given     Status: None   Collection Time: 12/23/14  3:54 PM  Result Value Ref Range Status   Specimen Description BLOOD RIGHT ASSIST CONTROL  Final   Special Requests   Final    BOTTLES DRAWN AEROBIC AND ANAEROBIC [place] AERO 5CC ANA   Culture NO GROWTH 7 DAYS  Final   Report Status 12/30/2014 FINAL  Final  Culture, blood (routine x 2) Call MD if unable to obtain prior to antibiotics being given     Status: None   Collection Time: 12/23/14  5:24 PM  Result Value Ref Range Status   Specimen  Description BLOOD RIGHT HAND  Final   Special Requests BOTTLES DRAWN AEROBIC AND ANAEROBIC 4CC  Final   Culture NO GROWTH 7 DAYS  Final   Report Status 12/30/2014 FINAL  Final  Culture, sputum-assessment     Status: None   Collection Time: 12/24/14 12:38 PM  Result Value Ref Range Status   Specimen Description EXPECTORATED SPUTUM  Final   Special Requests NONE  Final   Sputum evaluation THIS SPECIMEN IS ACCEPTABLE FOR SPUTUM CULTURE  Final   Report Status 12/24/2014 FINAL  Final  Culture, respiratory (NON-Expectorated)     Status: None   Collection Time: 12/24/14 12:38 PM  Result Value Ref Range Status   Specimen Description EXPECTORATED SPUTUM  Final   Special Requests NONE Reflexed from [id number]  Final   Gram Stain   Final    GOOD SPECIMEN - 80-90% WBCS MANY WBC SEEN MODERATE GRAM POSITIVE RODS FEW GRAM POSITIVE COCCI    Culture LIGHT GROWTH STAPHYLOCOCCUS AUREUS  Final   Report Status 12/28/2014 FINAL  Final   Organism ID, Bacteria STAPHYLOCOCCUS AUREUS  Final      Susceptibility   Staphylococcus aureus - MIC*    CIPROFLOXACIN <=0.5 SENSITIVE Sensitive     GENTAMICIN <=0.5 SENSITIVE Sensitive     OXACILLIN 0.5 SENSITIVE Sensitive     TRIMETH/SULFA <=10 SENSITIVE Sensitive     CEFOXITIN SCREEN NEGATIVE Sensitive     Inducible Clindamycin NEGATIVE Sensitive     ERYTHROMYCIN Value in next row Sensitive      SENSITIVE0.5    TETRACYCLINE Value in next row Sensitive      SENSITIVE<=1    CLINDAMYCIN Value in next row Sensitive      SENSITIVE<=0.25    LEVOFLOXACIN Value in next row Sensitive      SENSITIVE0.25    LINEZOLID Value in next row Sensitive      SENSITIVE2    * LIGHT GROWTH STAPHYLOCOCCUS AUREUS  MRSA PCR Screening     Status: None   Collection Time: 12/27/14  1:52 AM  Result Value Ref Range Status   MRSA by PCR NEGATIVE NEGATIVE Final    Comment:        The GeneXpert MRSA Assay (FDA approved for NASAL specimens only), is one component of a comprehensive MRSA  colonization surveillance program. It  is not intended to diagnose MRSA infection nor to guide or monitor treatment for MRSA infections.   C difficile quick scan w PCR reflex     Status: None   Collection Time: 12/29/14 11:28 AM  Result Value Ref Range Status   C Diff antigen NEGATIVE NEGATIVE Final   C Diff toxin NEGATIVE NEGATIVE Final   C Diff interpretation Negative for toxigenic C. difficile  Final     Studies:              All Imaging reviewed and is as per above notation   Scheduled Meds: . aspirin EC  81 mg Oral Daily  . calcitRIOL  0.25 mcg Oral Daily  . carvedilol  3.125 mg Oral BID  . darbepoetin (ARANESP) injection - DIALYSIS  200 mcg Subcutaneous Q Mon-HD  . dextromethorphan-guaiFENesin  1 tablet Oral BID  . feeding supplement (NEPRO CARB STEADY)  237 mL Oral BID BM  . ferric gluconate (FERRLECIT/NULECIT) IV  125 mg Intravenous Daily  . insulin aspart  0-15 Units Subcutaneous TID WC  . insulin aspart  0-5 Units Subcutaneous QHS  . insulin glargine  10 Units Subcutaneous QHS  . isosorbide mononitrate  90 mg Oral QHS  . multivitamin  1 tablet Oral QHS  . pregabalin  75 mg Oral BID  . ranolazine  1,000 mg Oral BID  . rosuvastatin  10 mg Oral QHS  . sodium chloride  3 mL Intravenous Q12H  . sodium chloride  3 mL Intravenous Q12H  . ticagrelor  90 mg Oral BID   Continuous Infusions:    Assessment/Plan:  RLL pneumonia / acute respiratory failure - initially RX with IV Levaquin and then transitoned to Vancomycin and Zosyn on 11/21 de-escalated Abx back to levaquin 11/23 Monitor fever curve and get CBC + Diff am CXR 11/23 showed streaky atelectasis and no fevers therefore discontinued Levaquin 11/24  Septic shock/ syncope due to hypotension - Improved with treatment of underlying infection  -stable  ESRD on HD-new to dialysis and will be clipped at 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 Next dialysis 11/25.   Verneita Griffes, MD  [facility] Pager [phone] 01/01/2015, 11:03 AM    LOS: 4 days

## 2015-01-01 NOTE — Progress Notes (Signed)
    Subjective:  Doing fine. Seen in HD unit. No chest pain or shortness of breath. Off of supplemental O2.   Objective:  Vital Signs in the last 24 hours: Temp:  [97.6 F (36.4 C)-98.3 F (36.8 C)] 97.6 F (36.4 C) (11/25 0815) Pulse Rate:  [67-79] 67 (11/25 0930) Resp:  [16-20] 20 (11/25 0815) BP: (139-166)/(63-82) 148/63 mmHg (11/25 0930) SpO2:  [94 %-98 %] 97 % (11/25 0815) Weight:  [193 lb 1.6 oz (87.59 kg)-194 lb 0.1 oz (88 kg)] 194 lb 0.1 oz (88 kg) (11/25 0815)  Intake/Output from previous day: 11/24 0701 - 11/25 0700 In: 620 [P.O.:600; I.V.:20] Out: 200 [Urine:200]  Physical Exam: Pt is alert and oriented, NAD HEENT: normal Neck: JVP - normal Lungs: CTA bilaterally CV: RRR without murmur or gallop Abd: soft, NT Ext: no C/C/E Skin: warm/dry no rash   Lab Results:  Recent Labs  12/31/14 0329 01/01/15 0902  WBC 6.0 6.2  HGB 7.8* 8.4*  PLT 105* 108*    Recent Labs  12/31/14 0329 01/01/15 0902  NA 140 140  K 3.5 3.6  CL 105 107  CO2 23 23  GLUCOSE 92 194*  BUN 56* 49*  CREATININE 5.57* 5.44*   No results for input(s): TROPONINI in the last 72 hours.  Invalid input(s): CK, MB  Assessment/Plan:  CAD, native vessel and bypass graft disease, with angina: pt remains stable on medical therapy which includes low-dose coreg, isosorbide, and ranexa.   Chronic systolic CHF: continue HD for volume management. Medical therapy as above. No changes recommended today.  Will sign off. Please call if any questions. Outpatient cards follow-up arranged with Dr Ellyn Hack 12-14.    Sherren Mocha, M.D. 01/01/2015, 10:14 AM

## 2015-01-01 NOTE — Evaluation (Signed)
Physical Therapy Evaluation Patient Details Name: Joseph [name] MRN: [id number] DOB: 1945/05/30 Today's Date: 01/01/2015   History of Present Illness  Patient is a 69 y/o male who presents with worsening CHF, cough and SOB, found to have right lower lung pneumonia, was placed on IV vancomycin and Zosyn. Briefly required vasopressors for hypotension on 11/20 and was transferred to ICU. Required HD during hospitalization. PMH includes NSTEMI, CABG, CKD, DM, chronic thrombocytopenia.  Clinical Impression  Patient presents with DOE, decreased endurance and deconditioning due to hospitalization. Tolerated short ambulation bouts with longer rest breaks due to SOB. SP02 remained >90% on RA. Discussed slowly increasing activity at home, energy conservation techniques and safety at home. Pt eager to return home. Will follow acutely to maximize independence and mobility prior to return home.    Follow Up Recommendations No PT follow up;Supervision - Intermittent    Equipment Recommendations  None recommended by PT    Recommendations for Other Services OT consult     Precautions / Restrictions Precautions Precautions: None Restrictions Weight Bearing Restrictions: No      Mobility  Bed Mobility Overal bed mobility: Needs Assistance Bed Mobility: Supine to Sit;Sit to Supine     Supine to sit: Independent Sit to supine: Independent      Transfers Overall transfer level: Needs assistance Equipment used: None Transfers: Sit to/from Stand Sit to Stand: Modified independent (Device/Increase time)         General transfer comment: Stood from chair x1, from EOB x2.  Ambulation/Gait Ambulation/Gait assistance: Supervision Ambulation Distance (Feet): 100 Feet (+ 100') Assistive device: None Gait Pattern/deviations: Step-through pattern;Decreased stride length;Staggering right;Drifts right/left   Gait velocity interpretation: <1.8 ft/sec, indicative of risk for recurrent  falls General Gait Details: Slow, mildly unsteady gait requiring need for rail PRN for support. Balance improved with increased distance. See higher level balance section. DOE 3/4. Sp02 ~90% post ambulation. Long seated rest break btw bouts.  Stairs            Wheelchair Mobility    Modified Rankin (Stroke Patients Only)       Balance Overall balance assessment: Needs assistance Sitting-balance support: Feet supported;No upper extremity supported Sitting balance-Leahy Scale: Good     Standing balance support: During functional activity Standing balance-Leahy Scale: Fair               High level balance activites: Head turns;Sudden stops;Direction changes High Level Balance Comments: Tolerated above activities with mild deviations in gait but no LOB. Able to step over objects without difficulty.              Pertinent Vitals/Pain Pain Assessment: No/denies pain    Home Living Family/patient expects to be discharged to:: Private residence Living Arrangements: Alone Available Help at Discharge: Family Type of Home: House Home Access: Level entry     Home Layout: One level Home Equipment: None      Prior Function Level of Independence: Independent         Comments: Likes to play pickle ball. Drives.      Hand Dominance        Extremity/Trunk Assessment   Upper Extremity Assessment: Defer to OT evaluation           Lower Extremity Assessment: Overall WFL for tasks assessed         Communication   Communication: No difficulties  Cognition Arousal/Alertness: Awake/alert Behavior During Therapy: WFL for tasks assessed/performed Overall Cognitive Status: Within Functional Limits for tasks assessed  General Comments      Exercises        Assessment/Plan    PT Assessment Patient needs continued PT services  PT Diagnosis Difficulty walking   PT Problem List Decreased balance;Decreased  mobility;Decreased activity tolerance;Cardiopulmonary status limiting activity  PT Treatment Interventions Balance training;Gait training;Stair training;Functional mobility training;Therapeutic activities;Patient/family education;Therapeutic exercise   PT Goals (Current goals can be found in the Care Plan section) Acute Rehab PT Goals Patient Stated Goal: to get home to my dogs and get back to pickle ball PT Goal Formulation: With patient Time For Goal Achievement: 01/15/15 Potential to Achieve Goals: Good    Frequency Min 3X/week   Barriers to discharge Decreased caregiver support pt lives alone with 2 dogs    Co-evaluation               End of Session   Activity Tolerance: Patient limited by fatigue Patient left: in bed;with call bell/phone within reach Nurse Communication: Mobility status         Time: [id number] PT Time Calculation (min) (ACUTE ONLY): 18 min   Charges:   PT Evaluation $Initial PT Evaluation Tier I: 1 Procedure     PT G Codes:        Amulya Quintin A Tanor Glaspy 01/01/2015, 2:05 PM  Wray Kearns, [place], DPT [phone]

## 2015-01-01 NOTE — Progress Notes (Signed)
01/01/2015 3:03 PM Hemodialysis Outpatient Note; I spoke with Joseph [name] the admissions coordinator a number of times today trying to get a schedule for [name]. The [facility] on [address] in [place] has accepted this patient however due to the Holiday this week and staff having today off I am unable to ascertain his dialysis schedule. The most they can tell me is he will start next week. I did the best I could. Thank you. Gordy Savers

## 2015-01-02 ENCOUNTER — Other Ambulatory Visit: Payer: Self-pay

## 2015-01-02 LAB — GLUCOSE, CAPILLARY
GLUCOSE-CAPILLARY: 65 mg/dL (ref 65–99)
Glucose-Capillary: 105 mg/dL — ABNORMAL HIGH (ref 65–99)
Glucose-Capillary: 252 mg/dL — ABNORMAL HIGH (ref 65–99)
Glucose-Capillary: 139 mg/dL — ABNORMAL HIGH (ref 65–99)
Glucose-Capillary: 112 mg/dL — ABNORMAL HIGH (ref 65–99)

## 2015-01-02 LAB — TROPONIN I
TROPONIN I: 0.62 ng/mL — AB (ref <0.031)
Troponin I: 0.56 ng/mL (ref <0.031)

## 2015-01-02 NOTE — Progress Notes (Signed)
Joseph [name] [id number] DOB: 1945-05-01 DOA: 12/28/2014 PCP: Marden Noble, MD  Brief narrative: 69 y/o ? 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  He was hydrated aggressively for his hypotension after being admitted.  He was placed on pressors for a short duration on 11/20.  He also received 1 U PRBC 11/19. He was previously on dialysis from 10/15 through 3/16.  He was started on dialysis again on 11/18.  transferred to [facility] on 11/21 due to patient preference.   Past medical history-As per Problem list Chart reviewed as below-   Consultants:  renal  Procedures:  none  Antibiotics:  Levaquin 11/16-11/18  Vancomycin 11/20  Zosyn 11/20    Subjective   Cp last pm troponin slightly up but no ekg changes Now resolved with nitro x2 Ambulatory and no other concerns   Objective    Interim History:  Telemetry: sinusbrady    Objective: Filed Vitals:   01/01/15 1240 01/01/15 2009 01/02/15 0512 01/02/15 0836  BP: 154/84 143/60 155/75 151/70  Pulse: 81 72 70 70  Temp:  98.5 F (36.9 C) 97.7 F (36.5 C) 97.9 F (36.6 C)  TempSrc:  Oral Oral Oral  Resp:  18 20 18   Height:      Weight:      SpO2:  98% 97% 98%    Intake/Output Summary (Last 24 hours) at 01/02/15 1509 Last data filed at 01/02/15 1300  Gross per 24 hour  Intake    460 ml  Output    475 ml  Net    -15 ml    Exam:  General: eomi ncat Cardiovascular: s1 s2 no m/r/g Respiratory: clear no added sounhd Abdomen: soft nt nd no rebound Skin LUE access covered-Has a dialysis access in L neck Neuro intact  Data Reviewed: Basic  Metabolic Panel:  Recent Labs Lab 12/28/14 0445 12/29/14 0330 12/30/14 0435 12/31/14 0329 01/01/15 0902  NA 139 140 141 140 140  K 4.1 3.9 3.8 3.5 3.6  CL 105 107 105 105 107  CO2 24 24 26 23 23   GLUCOSE 106* 97 97 92 194*  BUN 68* 72* 53* 56* 49*  CREATININE 5.42* 6.26* 5.08* 5.57* 5.44*  CALCIUM 8.1* 8.1* 8.0* 8.1* 8.6*  PHOS  --   --   --   --  5.1*   Liver Function Tests:  Recent Labs Lab 01/01/15 0902  ALBUMIN 2.8*   No results for input(s): LIPASE, AMYLASE in the last 168 hours. No results for input(s): AMMONIA in the last 168 hours. CBC:  Recent Labs Lab 12/27/14 1135 12/28/14 0445 12/29/14 0330 12/30/14 0435 12/31/14 0329 01/01/15 0902  WBC 4.7 5.1 4.6 5.0 6.0 6.2  NEUTROABS 3.8  --   --   --   --   --   HGB 6.9* 7.7* 7.3* 7.3* 7.8* 8.4*  HCT 20.7* 22.7* 22.4* 22.9* 23.7* 23.2*  MCV 84.8 85.1 86.5 86.7 89.1 91.0  PLT 89* 81* 90* 91* 105* 108*   Cardiac Enzymes:  Recent Labs Lab 01/01/15 1900 01/02/15 0050 01/02/15 0439  TROPONINI 0.60* 0.62* 0.56*   BNP: Invalid input(s): POCBNP CBG:  Recent Labs Lab 01/01/15 0654 01/01/15 1647 01/01/15 2119 01/02/15 0747  01/02/15 1121  GLUCAP 124* 134* 161* 105* 252*    Recent Results (from the past 240 hour(s))  Culture, blood (routine x 2) Call MD if unable to obtain prior to antibiotics being given     Status: None   Collection Time: 12/23/14  3:54 PM  Result Value Ref Range Status   Specimen Description BLOOD RIGHT ASSIST CONTROL  Final   Special Requests   Final    BOTTLES DRAWN AEROBIC AND ANAEROBIC 8CC AERO 5CC ANA   Culture NO GROWTH 7 DAYS  Final   Report Status 12/30/2014 FINAL  Final  Culture, blood (routine x 2) Call MD if unable to obtain prior to antibiotics being given     Status: None   Collection Time: 12/23/14  5:24 PM  Result Value Ref Range Status   Specimen Description BLOOD RIGHT HAND  Final   Special Requests BOTTLES DRAWN AEROBIC AND ANAEROBIC 4CC  Final   Culture NO  GROWTH 7 DAYS  Final   Report Status 12/30/2014 FINAL  Final  Culture, sputum-assessment     Status: None   Collection Time: 12/24/14 12:38 PM  Result Value Ref Range Status   Specimen Description EXPECTORATED SPUTUM  Final   Special Requests NONE  Final   Sputum evaluation THIS SPECIMEN IS ACCEPTABLE FOR SPUTUM CULTURE  Final   Report Status 12/24/2014 FINAL  Final  Culture, respiratory (NON-Expectorated)     Status: None   Collection Time: 12/24/14 12:38 PM  Result Value Ref Range Status   Specimen Description EXPECTORATED SPUTUM  Final   Special Requests NONE Reflexed from [id number]  Final   Gram Stain   Final    GOOD SPECIMEN - 80-90% WBCS MANY WBC SEEN MODERATE GRAM POSITIVE RODS FEW GRAM POSITIVE COCCI    Culture LIGHT GROWTH STAPHYLOCOCCUS AUREUS  Final   Report Status 12/28/2014 FINAL  Final   Organism ID, Bacteria STAPHYLOCOCCUS AUREUS  Final      Susceptibility   Staphylococcus aureus - MIC*    CIPROFLOXACIN <=0.5 SENSITIVE Sensitive     GENTAMICIN <=0.5 SENSITIVE Sensitive     OXACILLIN 0.5 SENSITIVE Sensitive     TRIMETH/SULFA <=10 SENSITIVE Sensitive     CEFOXITIN SCREEN NEGATIVE Sensitive     Inducible Clindamycin NEGATIVE Sensitive     ERYTHROMYCIN Value in next row Sensitive      SENSITIVE0.5    TETRACYCLINE Value in next row Sensitive      SENSITIVE<=1    CLINDAMYCIN Value in next row Sensitive      SENSITIVE<=0.25    LEVOFLOXACIN Value in next row Sensitive      SENSITIVE0.25    LINEZOLID Value in next row Sensitive      SENSITIVE2    * LIGHT GROWTH STAPHYLOCOCCUS AUREUS  MRSA PCR Screening     Status: None   Collection Time: 12/27/14  1:52 AM  Result Value Ref Range Status   MRSA by PCR NEGATIVE NEGATIVE Final    Comment:        The GeneXpert MRSA Assay (FDA approved for NASAL specimens only), is one component of a comprehensive MRSA colonization surveillance program. It is not intended to diagnose MRSA infection nor to guide or monitor treatment  for MRSA infections.   C difficile quick scan w PCR reflex     Status: None   Collection Time: 12/29/14 11:28 AM  Result Value Ref Range Status   C Diff antigen NEGATIVE NEGATIVE Final   C Diff toxin NEGATIVE NEGATIVE Final  C Diff interpretation Negative for toxigenic C. difficile  Final     Studies:              All Imaging reviewed and is as per above notation   Scheduled Meds: . aspirin EC  81 mg Oral Daily  . calcitRIOL  0.25 mcg Oral Daily  . carvedilol  3.125 mg Oral BID  . darbepoetin (ARANESP) injection - DIALYSIS  200 mcg Subcutaneous Q Mon-HD  . dextromethorphan-guaiFENesin  1 tablet Oral BID  . feeding supplement (NEPRO CARB STEADY)  237 mL Oral BID BM  . ferric gluconate (FERRLECIT/NULECIT) IV  125 mg Intravenous Daily  . insulin aspart  0-15 Units Subcutaneous TID WC  . insulin aspart  0-5 Units Subcutaneous QHS  . insulin glargine  10 Units Subcutaneous QHS  . isosorbide mononitrate  90 mg Oral QHS  . multivitamin  1 tablet Oral QHS  . pregabalin  75 mg Oral BID  . ranolazine  1,000 mg Oral BID  . rosuvastatin  10 mg Oral QHS  . sodium chloride  3 mL Intravenous Q12H  . sodium chloride  3 mL Intravenous Q12H  . ticagrelor  90 mg Oral BID   Continuous Infusions:    Assessment/Plan:  RLL pneumonia / acute respiratory failure - initially RX with IV Levaquin and then transitoned to Vancomycin and Zosyn on 11/21 de-escalated Abx back to levaquin 11/23 Monitor fever curve and get CBC + Diff am CXR 11/23 showed streaky atelectasis and no fevers therefore discontinued Levaquin 11/24  Septic shock/ syncope due to hypotension - Improved with treatment of underlying infection  -stable  ESRD on HD-new to dialysis and will be clipped at [facility] - has been off on dialysis for 1 yr and was stable - 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- dialysis resumed - being followed by renal team- dialysis to occur next on 11/25 - awaiting clip for dialysis in [place] at present  CAD s/p  CABG PCI/DES July 2016 - cont Brilinta and ASA- needs dual antiplatelet therapy indefinitely per cardiology - on Imdur and Ranexa 1000 bid -had CP on 11/25 with minimal elevation of the troponin's, EKG relatively uinchanged -nursing informed to cycle troponin if repeated cp and give nitro -Would ask for cards input if recurrent  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 - fluid balance per dialysis, cont Coreg 3.125 bud, Imdur 90 QHS - Dialysis 11/25   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 Next dialysis 11/25.   Verneita Griffes, MD  [facility] Pager [phone] 01/02/2015, 3:09 PM    LOS: 5 days

## 2015-01-02 NOTE — Progress Notes (Signed)
Hypoglycemic Event  CBG: 65  Treatment: 15 GM carbohydrate snack  Symptoms: None  Follow-up CBG: Time:1630 CBG Result:139  Possible Reasons for Event: Inadequate meal intake  Comments/MD notified:Samtani, MD    Eber Jones

## 2015-01-02 NOTE — Progress Notes (Addendum)
Second troponin 0.62, Dr. Donnal Debar notified and aware, no intervention at this time, third troponin to be drawn at 0600, patient sleeping comfortably at this time with no cardiac complaints, will continue to monitor closely.

## 2015-01-03 LAB — GLUCOSE, CAPILLARY
GLUCOSE-CAPILLARY: 231 mg/dL — AB (ref 65–99)
GLUCOSE-CAPILLARY: 124 mg/dL — AB (ref 65–99)
Glucose-Capillary: 119 mg/dL — ABNORMAL HIGH (ref 65–99)
Glucose-Capillary: 170 mg/dL — ABNORMAL HIGH (ref 65–99)
Glucose-Capillary: 150 mg/dL — ABNORMAL HIGH (ref 65–99)

## 2015-01-03 NOTE — Progress Notes (Signed)
Subjective: Interval History: has complaints concerned about catching pneu.  Objective: Vital signs in last 24 hours: Temp:  [97.6 F (36.4 C)-98.6 F (37 C)] 98 F (36.7 C) (11/27 0902) Pulse Rate:  [67-83] 79 (11/27 0902) Resp:  [18-19] 18 (11/27 0902) BP: (129-160)/(63-87) 130/64 mmHg (11/27 0902) SpO2:  [96 %-98 %] 97 % (11/27 0902) Weight change:   Intake/Output from previous day: 11/26 0701 - 11/27 0700 In: 480 [P.O.:460; I.V.:20] Out: -  Intake/Output this shift: Total I/O In: 480 [P.O.:480] Out: 0   General appearance: alert, cooperative and no distress Resp: rales RLL Cardio: S1, S2 normal and systolic murmur: holosystolic 2/6, blowing at apex GI: pos bs, liver down 5 cm. soft Extremities: AVF LLA  Lab Results:  Recent Labs  01/01/15 0902  WBC 6.2  HGB 8.4*  HCT 23.2*  PLT 108*   BMET:  Recent Labs  01/01/15 0902  NA 140  K 3.6  CL 107  CO2 23  GLUCOSE 194*  BUN 49*  CREATININE 5.44*  CALCIUM 8.6*   No results for input(s): PTH in the last 72 hours. Iron Studies: No results for input(s): IRON, TIBC, TRANSFERRIN, FERRITIN in the last 72 hours.  Studies/Results: No results found.  I have reviewed the patient's current medications.  Assessment/Plan: 1 ESRD for HD in am. 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

## 2015-01-03 NOTE — Progress Notes (Signed)
Joseph [name] [id number] DOB: Feb 04, 1946 DOA: 12/28/2014 PCP: Marden Noble, MD  Brief narrative: 69 y/o ? 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  He was hydrated aggressively for his hypotension after being admitted.  He was placed on pressors for a short duration on 11/20.  He also received 1 U PRBC 11/19. He was previously on dialysis from 10/15 through 3/16.  He was started on dialysis again on 11/18.  transferred to [facility] on 11/21 due to patient preference.   Past medical history-As per Problem list Chart reviewed as below-   Consultants:  renal  Procedures:  none  Antibiotics:  Levaquin 11/16-11/18  Vancomycin 11/20  Zosyn 11/20    Subjective    doing fair. Noted hypoglycemia yesterday  tolerating diet now  no chest pain although concerned about cough  No fever no chills.male    Objective    Interim History:  Telemetry: sinusbrady    Objective: Filed Vitals:   01/02/15 1644 01/02/15 2042 01/03/15 0500 01/03/15 0902  BP: 159/66 160/87 129/63 130/64  Pulse: 69 83 67 79  Temp: 97.7 F (36.5 C) 98.6 F (37 C) 97.6 F (36.4 C) 98 F (36.7 C)  TempSrc: Oral Oral Oral Oral  Resp: 19 18 18 18   Height:      Weight:      SpO2: 98% 96% 96% 97%    Intake/Output Summary (Last 24 hours) at 01/03/15 1418 Last data filed at 01/03/15 1204  Gross per 24 hour  Intake    500 ml  Output      0 ml  Net    500 ml    Exam:  General: eomi ncat Cardiovascular: s1 s2 no m/r/g Respiratory: clear no added sounhd Abdomen: soft nt nd no rebound Skin LUE access covered-Has a dialysis access in  L neck Neuro intact  Data Reviewed: Basic Metabolic Panel:  Recent Labs Lab 12/28/14 0445 12/29/14 0330 12/30/14 0435 12/31/14 0329 01/01/15 0902  NA 139 140 141 140 140  K 4.1 3.9 3.8 3.5 3.6  CL 105 107 105 105 107  CO2 24 24 26 23 23   GLUCOSE 106* 97 97 92 194*  BUN 68* 72* 53* 56* 49*  CREATININE 5.42* 6.26* 5.08* 5.57* 5.44*  CALCIUM 8.1* 8.1* 8.0* 8.1* 8.6*  PHOS  --   --   --   --  5.1*   Liver Function Tests:  Recent Labs Lab 01/01/15 0902  ALBUMIN 2.8*   No results for input(s): LIPASE, AMYLASE in the last 168 hours. No results for input(s): AMMONIA in the last 168 hours. CBC:  Recent Labs Lab 12/28/14 0445 12/29/14 0330 12/30/14 0435 12/31/14 0329 01/01/15 0902  WBC 5.1 4.6 5.0 6.0 6.2  HGB 7.7* 7.3* 7.3* 7.8* 8.4*  HCT 22.7* 22.4* 22.9* 23.7* 23.2*  MCV 85.1 86.5 86.7 89.1 91.0  PLT 81* 90* 91* 105* 108*   Cardiac Enzymes:  Recent Labs Lab 01/01/15 1900 01/02/15 0050 01/02/15 0439  TROPONINI 0.60* 0.62* 0.56*   BNP: Invalid input(s): POCBNP CBG:  Recent Labs Lab 01/02/15 1656 01/02/15 2056 01/03/15 0627 01/03/15 0901 01/03/15 1042  GLUCAP 139* 112* 119* 170* 231*    Recent Results (from the  past 240 hour(s))  MRSA PCR Screening     Status: None   Collection Time: 12/27/14  1:52 AM  Result Value Ref Range Status   MRSA by PCR NEGATIVE NEGATIVE Final    Comment:        The GeneXpert MRSA Assay (FDA approved for NASAL specimens only), is one component of a comprehensive MRSA colonization surveillance program. It is not intended to diagnose MRSA infection nor to guide or monitor treatment for MRSA infections.   C difficile quick scan w PCR reflex     Status: None   Collection Time: 12/29/14 11:28 AM  Result Value Ref Range Status   C Diff antigen NEGATIVE NEGATIVE Final   C Diff toxin NEGATIVE NEGATIVE Final   C Diff interpretation Negative for toxigenic C. difficile  Final     Studies:              All Imaging  reviewed and is as per above notation   Scheduled Meds: . aspirin EC  81 mg Oral Daily  . calcitRIOL  0.25 mcg Oral Daily  . carvedilol  3.125 mg Oral BID  . darbepoetin (ARANESP) injection - DIALYSIS  200 mcg Subcutaneous Q Mon-HD  . dextromethorphan-guaiFENesin  1 tablet Oral BID  . feeding supplement (NEPRO CARB STEADY)  237 mL Oral BID BM  . ferric gluconate (FERRLECIT/NULECIT) IV  125 mg Intravenous Daily  . insulin aspart  0-15 Units Subcutaneous TID WC  . insulin aspart  0-5 Units Subcutaneous QHS  . insulin glargine  10 Units Subcutaneous QHS  . isosorbide mononitrate  90 mg Oral QHS  . multivitamin  1 tablet Oral QHS  . pregabalin  75 mg Oral BID  . ranolazine  1,000 mg Oral BID  . rosuvastatin  10 mg Oral QHS  . sodium chloride  3 mL Intravenous Q12H  . sodium chloride  3 mL Intravenous Q12H  . ticagrelor  90 mg Oral BID   Continuous Infusions:    Assessment/Plan:  RLL pneumonia / acute respiratory failure - initially RX with IV Levaquin and then transitoned to Vancomycin and Zosyn on 11/21 de-escalated Abx back to levaquin 11/23 Monitor fever curve and get CBC + Diff am CXR 11/23 showed streaky atelectasis and no fevers therefore discontinued Levaquin 11/24  Septic shock/ syncope due to hypotension - Improved with treatment of underlying infection  -stable  ESRD on HD-new to dialysis and will be clipped at [facility] - has been off on dialysis for 1 yr and was stable - 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- dialysis resumed - being followed by renal team- dialysis to occur next on 11/25 - awaiting clip for dialysis in [place] at present  CAD s/p CABG PCI/DES July 2016 - cont Brilinta and ASA- needs dual antiplatelet therapy indefinitely per cardiology - on Imdur and Ranexa 1000 bid -had CP on 11/25 with minimal elevation of the troponin's, EKG relatively uinchanged -nursing informed to cycle troponin if repeated cp and give nitro - no further recurrences  Chronic systolic  CGF - EF 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 - fluid balance per dialysis, cont Coreg 3.125 bud, Imdur 90 QHS - Dialysis 11/25   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 - cont lantus 10 U and Novolog moderate SSI - CBG's 105-252 -  had episodic low blood sugars in the 60s on 11/26 which is now resolved   - monitor blood sugar trends   Anemia with iron deficiency  - Iron and Aranesp 200 mcg  q mon per nephrology   Thrombocytopenia/MGUS  -chronic-has had a normal liver and rectal Biopsy 2015 -apparently has been follwed at [facility] for MDS  Prostate Ca s/p Seed Stable currently  Liver fibrosis c Portal HTN stable  Code Status: Full Family Communication:  no family present at ebdside Disposition Plan:  currently awaiting clip at [facility] and will need outpatient follow-up.   Verneita Griffes, MD  [facility] Pager [phone] 01/03/2015, 2:18 PM    LOS: 6 days

## 2015-01-04 LAB — RENAL FUNCTION PANEL
ALBUMIN: 2.8 g/dL — AB (ref 3.5–5.0)
ANION GAP: 8 (ref 5–15)
BUN: 42 mg/dL — AB (ref 6–20)
CO2: 23 mmol/L (ref 22–32)
Calcium: 8.3 mg/dL — ABNORMAL LOW (ref 8.9–10.3)
Chloride: 107 mmol/L (ref 101–111)
Creatinine, Ser: 4.63 mg/dL — ABNORMAL HIGH (ref 0.61–1.24)
GFR, EST AFRICAN AMERICAN: 14 mL/min — AB (ref >60)
GFR, EST NON AFRICAN AMERICAN: 12 mL/min — AB (ref >60)
Glucose, Bld: 113 mg/dL — ABNORMAL HIGH (ref 65–99)
PHOSPHORUS: 5.1 mg/dL — AB (ref 2.5–4.6)
POTASSIUM: 3.8 mmol/L (ref 3.5–5.1)
Sodium: 138 mmol/L (ref 135–145)

## 2015-01-04 LAB — CBC
HEMATOCRIT: 26.3 % — AB (ref 39.0–52.0)
Hemoglobin: 8.5 g/dL — ABNORMAL LOW (ref 13.0–17.0)
MCH: 28.7 pg (ref 26.0–34.0)
MCHC: 32.3 g/dL (ref 30.0–36.0)
MCV: 88.9 fL (ref 78.0–100.0)
Platelets: 115 10*3/uL — ABNORMAL LOW (ref 150–400)
RBC: 2.96 MIL/uL — AB (ref 4.22–5.81)
RDW: 17.2 % — AB (ref 11.5–15.5)
WBC: 6.4 10*3/uL (ref 4.0–10.5)

## 2015-01-04 LAB — GLUCOSE, CAPILLARY
Glucose-Capillary: 102 mg/dL — ABNORMAL HIGH (ref 65–99)
Glucose-Capillary: 154 mg/dL — ABNORMAL HIGH (ref 65–99)

## 2015-01-04 MED ORDER — DM-GUAIFENESIN ER 30-600 MG PO TB12: 1.0000 | ORAL_TABLET | Freq: Two times a day (BID) | ORAL | Status: DC

## 2015-01-04 MED ORDER — PREGABALIN 75 MG PO CAPS: 75.0000 mg | ORAL_CAPSULE | Freq: Every day | ORAL | Status: AC

## 2015-01-04 MED ORDER — ALTEPLASE 2 MG IJ SOLR: 2.0000 mg | Freq: Once | INTRAMUSCULAR | Status: DC | PRN

## 2015-01-04 MED ORDER — PENTAFLUOROPROP-TETRAFLUOROETH EX AERO: 1.0000 "application " | INHALATION_SPRAY | CUTANEOUS | Status: DC | PRN

## 2015-01-04 MED ORDER — HEPARIN SODIUM (PORCINE) 1000 UNIT/ML DIALYSIS: 1000.0000 [IU] | INTRAMUSCULAR | Status: DC | PRN

## 2015-01-04 MED ORDER — SODIUM CHLORIDE 0.9 % IV SOLN: 100.0000 mL | INTRAVENOUS | Status: DC | PRN

## 2015-01-04 MED ORDER — DARBEPOETIN ALFA 200 MCG/0.4ML IJ SOSY
PREFILLED_SYRINGE | INTRAMUSCULAR | Status: AC
  Filled 2015-01-04: qty 0.4

## 2015-01-04 MED ORDER — HEPARIN SODIUM (PORCINE) 1000 UNIT/ML DIALYSIS: 100.0000 [IU]/kg | INTRAMUSCULAR | Status: DC | PRN

## 2015-01-04 MED ORDER — LIDOCAINE-PRILOCAINE 2.5-2.5 % EX CREA: 1.0000 "application " | TOPICAL_CREAM | CUTANEOUS | Status: DC | PRN

## 2015-01-04 MED ORDER — LIDOCAINE HCL (PF) 1 % IJ SOLN: 5.0000 mL | INTRAMUSCULAR | Status: DC | PRN

## 2015-01-04 MED ORDER — CARVEDILOL 3.125 MG PO TABS: 3.1250 mg | ORAL_TABLET | Freq: Two times a day (BID) | ORAL | Status: DC

## 2015-01-04 MED ORDER — INSULIN GLARGINE 100 UNIT/ML ~~LOC~~ SOLN: 10.0000 [IU] | Freq: Every day | SUBCUTANEOUS | Status: DC

## 2015-01-04 NOTE — Progress Notes (Deleted)
Physician Discharge Summary  Joseph [name] [id number] DOB: 1945-09-28 DOA: 12/28/2014  PCP: Marden Noble, MD  Admit date: 12/28/2014 Discharge date: 01/04/2015  Time spent: 45 minutes  Recommendations for Outpatient Follow-up:  1. Will need Chem-7 as well as CBC on discharge 2. Recommend close outpatient follow-up 3. Recommend outpatient chest x-ray if patient persistently coughing-he completed a course of antibiotics during hospital stay and does not require further antibiotics 4. Patient will be discharging home as is independent 5. Dialysis will be scheduled for him and he'll be given times and dates prior to discharge from hospital by dialysis coordinator 6. 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 Note medication changed on d/c Order on Discharge  - carvedilol (COREG) 3.125 MG tablet  Modify on Discharge  - insulin glargine (LANTUS) 100 UNIT/ML injection - pregabalin (LYRICA) 75 MG capsule  Don't Resume on Discharge  - carvedilol (COREG) 6.25 MG tablet - furosemide (LASIX) 40 MG tablet - hydrALAZINE (APRESOLINE) 50 MG tablet - sodium bicarbonate 650 MG tablet   Discharge Diagnoses:  Principal Problem:   RLL pneumonia Active Problems:   Type 2 diabetes mellitus with renal manifestations (HCC)   Anemia   Thrombocytopenia (HCC)   Ischemic cardiomyopathy   Essential hypertension   CAD s/p CABG 2001, prior POBA, PCI/DES July 2016   Chronic kidney disease   Orthostatic hypotension   Syncope   Discharge Condition:  fair  Diet recommendation:  renal heart healthy  Filed Weights   01/01/15 0815 01/01/15 1238 01/03/15 2131  Weight: 88 kg (194 lb 0.1 oz) 86.8 kg (191 lb 5.8 oz) 86.229 kg (190 lb 1.6 oz)    History of present illness:   69 y/o ? 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  He was hydrated aggressively for his hypotension after being admitted.  He was placed on pressors for a short duration on 11/20.  He also received 1 U PRBC 11/19. He was previously on dialysis from 10/15 through 3/16.  He was started on dialysis again on 11/18.  transferred to [facility] on 11/21 due to patient preference.    Hospital Course:  RLL pneumonia / acute respiratory failure - initially RX with IV Levaquin and then transitoned to Vancomycin and Zosyn on 11/21 de-escalated Abx back to levaquin 11/23 Monitor fever curve and get CBC + Diff am CXR 11/23 showed streaky atelectasis and no fevers therefore discontinued Levaquin 11/24 -Patient given prescription for Mucinex on discharge  Septic shock/ syncope due to hypotension - Improved with treatment of underlying infection  -stable  ESRD on HD-new to dialysis and will be clipped at [name] - has been off on dialysis for 1 yr and was stable - 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- dialysis resumed - being followed by renal team- dialysis to occur next on 11/25 - awaiting clip for dialysis in [place] at present  CAD s/p CABG PCI/DES July 2016 - cont Brilinta and ASA- needs dual antiplatelet therapy indefinitely per cardiology - on Imdur and Ranexa 1000 bid -had CP on 11/25 with minimal elevation of the troponin's, EKG relatively uinchanged -nursing informed to cycle troponin if repeated cp and  give nitro - no further recurrences  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 - fluid balance per dialysis, cont Coreg 3.125 bud, Imdur 90 QHS - Dialysis 11/25   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 - cont lantus 10 U and Novolog moderate  SSI - CBG's 105-252 - had episodic low blood sugars in the 60s on 11/26 which is now resolved  - monitor blood sugar trends   Anemia with iron deficiency  - Iron and Aranesp 200 mcg q mon per nephrology   Thrombocytopenia/MGUS  -chronic-has had a normal liver and rectal Biopsy 2015 -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  Prostate Ca s/p Seed Stable currently  Liver fibrosis c Portal HTN stable   Consultations:   nephrology  Discharge Exam: Filed Vitals:   01/04/15 0430 01/04/15 1008  BP: 122/52 134/71  Pulse: 96 71  Temp: 97.8 F (36.6 C) 97.7 F (36.5 C)  Resp: 18 17    General:  doing fair still mild cough, no fever no chills, no other complaints ambulatory  Cardiovascular:  S1-S2 no murmur rub or gallop Respiratory:  clinically clear no added sound  Discharge Instructions   Discharge Instructions    Diet - low sodium heart healthy    Complete by:  As directed      Discharge instructions    Complete by:  As directed   Please note changes to your medications-there will be quite a few meds that have changed We will attempt to get you schedule for dialysis and you should know what is your going to dialysis on discharge.     Increase activity slowly    Complete by:  As directed           Current Discharge Medication List    CONTINUE these medications which have CHANGED   Details  carvedilol (COREG) 3.125 MG tablet Take 1 tablet (3.125 mg total) by mouth 2 (two) times daily with a meal.    insulin glargine (LANTUS) 100 UNIT/ML injection Inject 0.1 mLs (10 Units total) into the skin at bedtime. Qty: 10 mL, Refills: 11    pregabalin (LYRICA) 75 MG capsule Take 1 capsule (75 mg total) by mouth daily.      CONTINUE these medications which have NOT CHANGED   Details  ALPRAZolam (XANAX) 0.5 MG tablet Take 0.5 mg by mouth as needed for anxiety.    aspirin EC 81 MG tablet Take 81 mg by mouth daily.    calcitRIOL (ROCALTROL) 0.25 MCG capsule Take  0.25 mcg by mouth daily.     ferrous sulfate 325 (65 FE) MG EC tablet Take 325 mg by mouth daily with breakfast.     isosorbide mononitrate (IMDUR) 60 MG 24 hr tablet Take 1.5 tablets (90 mg total) by mouth daily. Qty: 135 tablet, Refills: 3    nitroGLYCERIN (NITROLINGUAL) 0.4 MG/SPRAY spray Place 1 spray under the tongue every 5 (five) minutes x 3 doses as needed for chest pain. Do not use together with sublingual nitro Qty: 12 g, Refills: 5    rosuvastatin (CRESTOR) 20 MG tablet Take 20 mg by mouth at bedtime.     ticagrelor (BRILINTA) 90 MG TABS tablet Take 1 tablet (90 mg total) by mouth 2 (two) times daily. Qty: 60 tablet, Refills: 6      STOP taking these medications     hydrALAZINE (APRESOLINE) 50 MG tablet      ranolazine (RANEXA) 1000 MG SR tablet  sodium bicarbonate 650 MG tablet      furosemide (LASIX) 40 MG tablet        Allergies  Allergen Reactions  . Sulfa Antibiotics Itching    hives  . Morphine And Related     Makes patient feel weird      The results of significant diagnostics from this hospitalization (including imaging, microbiology, ancillary and laboratory) are listed below for reference.    Significant Diagnostic Studies: Dg Chest 2 View  12/30/2014  CLINICAL DATA:  Pneumonia EXAM: CHEST  2 VIEW COMPARISON:  12/27/2014 FINDINGS: Cardiomediastinal silhouette is stable. Status post CABG. Left IJ central line is unchanged in position. There is streaky right infrahilar atelectasis, infiltrate or bronchitic changes. No pulmonary edema. Degenerative changes thoracic spine. IMPRESSION: Streaky right infrahilar atelectasis, infiltrate or bronchitic changes. No pulmonary edema. Degenerative changes thoracic spine. Electronically Signed   By: Lahoma Crocker M.D.   On: 12/30/2014 10:59   Dg Chest 2 View  12/23/2014  CLINICAL DATA:  Syncopal episode this morning, fell several times, cough, CHF, hypertension, diabetes mellitus, former smoker EXAM: CHEST  2  VIEW COMPARISON:  11/03/2014 FINDINGS: Enlargement of cardiac silhouette post CABG. Mediastinal contours and pulmonary vascularity normal. RIGHT basilar infiltrate consistent with pneumonia. Remaining lungs clear. No pleural effusion or pneumothorax. IMPRESSION: Enlargement of cardiac silhouette post CABG. RIGHT basilar infiltrate consistent with pneumonia. Electronically Signed   By: Lavonia Dana M.D.   On: 12/23/2014 10:49   Ct Chest Wo Contrast  12/27/2014  CLINICAL DATA:  Shortness of breath. Prostate cancer. CABG. Former smoker. EXAM: CT CHEST WITHOUT CONTRAST TECHNIQUE: Multidetector CT imaging of the chest was performed following the standard protocol without IV contrast. COMPARISON:  03/04/2013 PET-CT.  12/23/2014 chest radiograph. FINDINGS: Images are motion degraded. Mediastinum/Nodes: Stable mild cardiomegaly. No pericardial fluid/thickening. Left main, left anterior descending, left circumflex and right coronary atherosclerosis, with ascending aortic and left internal mammary bypass grafts. Normal caliber thoracic aorta. Stable dilation of the main pulmonary artery (3.4 cm diameter). Normal visualized thyroid. Normal esophagus. No axillary adenopathy. Mildly enlarged 1.1 cm right paratracheal node (series 2/ image 13), previously 0.9 cm . Mildly enlarged 1.1 cm subcarinal node (2/26), increased from 0.9 cm. No gross hilar adenopathy on this noncontrast study. Lungs/Pleura: No pneumothorax. No pleural effusion. There is mild centrilobular emphysema. There are extensive patchy tree-in-bud opacities, patchy centrilobular nodular opacities and patchy peribronchovascular ground-glass opacity throughout both lungs, most prominent in the right upper, right middle and right lower lobes. Upper abdomen: Unremarkable. Musculoskeletal: No aggressive appearing focal osseous lesions. Median sternotomy wires are intact. Marked degenerative changes in the thoracic spine. IMPRESSION: 1. Extensive patchy tree-in-bud  opacities, patchy centrilobular nodular opacities and patchy peribronchovascular ground-glass opacities throughout both lungs, asymmetrically involving the lobes of the right lung. These findings are most in keeping with an infectious bronchiolitis/developing bronchopneumonia, with the differential including bacterial, fungal or tuberculosis infections. 2. New mild mediastinal lymphadenopathy, nonspecific, likely reactive. 3. Mild centrilobular emphysema. 4. Left main and 3 vessel coronary atherosclerosis status post CABG. 5. Stable dilated main pulmonary artery, suggesting chronic pulmonary arterial hypertension. Electronically Signed   By: Ilona Sorrel M.D.   On: 12/27/2014 10:44   Dg Chest Port 1 View  12/27/2014  CLINICAL DATA:  Patient status post PICC line placement. Interval re- adjustment. EXAM: PORTABLE CHEST 1 VIEW COMPARISON:  Earlier same day FINDINGS: Interval re- adjustment PICC line with tip projecting over the superior vena cava. Stable cardiomegaly status post median sternotomy and CABG procedure. Unchanged  heterogeneous opacities right lung base most compatible with pneumonia. No pleural effusion or pneumothorax. IMPRESSION: Interval re- adjustment PICC line with tip projecting over the superior vena cava. Persistent heterogeneous opacities right lung base favored to represent pneumonia. Cardiomegaly. Electronically Signed   By: Lovey Newcomer M.D.   On: 12/27/2014 16:20   Dg Chest Port 1 View  12/27/2014  CLINICAL DATA:  Patient status post PICC line placement. EXAM: PORTABLE CHEST 1 VIEW COMPARISON:  Chest radiograph 12/23/2014. FINDINGS: Interval insertion of PICC line with tip projecting over the expected location of the central left brachiocephalic vein near the confluence with the superior vena cava. Stable cardiac and mediastinal contours, enlarged. Pulmonary vascular redistribution. No frank pulmonary edema. Re- demonstrated heterogeneous opacities within the right mid lower lung. No  pleural effusion or pneumothorax. IMPRESSION: New PICC line tip projects at the confluence of the left brachiocephalic vein and superior vena cava. Consider repositioning. Cardiomegaly and pulmonary vascular redistribution. Re- demonstrated right mid and lower lung heterogeneous opacities most compatible with pneumonia. Electronically Signed   By: Lovey Newcomer M.D.   On: 12/27/2014 16:12    Microbiology: Recent Results (from the past 240 hour(s))  MRSA PCR Screening     Status: None   Collection Time: 12/27/14  1:52 AM  Result Value Ref Range Status   MRSA by PCR NEGATIVE NEGATIVE Final    Comment:        The GeneXpert MRSA Assay (FDA approved for NASAL specimens only), is one component of a comprehensive MRSA colonization surveillance program. It is not intended to diagnose MRSA infection nor to guide or monitor treatment for MRSA infections.   C difficile quick scan w PCR reflex     Status: None   Collection Time: 12/29/14 11:28 AM  Result Value Ref Range Status   C Diff antigen NEGATIVE NEGATIVE Final   C Diff toxin NEGATIVE NEGATIVE Final   C Diff interpretation Negative for toxigenic C. difficile  Final     Labs: Basic Metabolic Panel:  Recent Labs Lab 12/29/14 0330 12/30/14 0435 12/31/14 0329 01/01/15 0902  NA 140 141 140 140  K 3.9 3.8 3.5 3.6  CL 107 105 105 107  CO2 24 26 23 23   GLUCOSE 97 97 92 194*  BUN 72* 53* 56* 49*  CREATININE 6.26* 5.08* 5.57* 5.44*  CALCIUM 8.1* 8.0* 8.1* 8.6*  PHOS  --   --   --  5.1*   Liver Function Tests:  Recent Labs Lab 01/01/15 0902  ALBUMIN 2.8*   No results for input(s): LIPASE, AMYLASE in the last 168 hours. No results for input(s): AMMONIA in the last 168 hours. CBC:  Recent Labs Lab 12/29/14 0330 12/30/14 0435 12/31/14 0329 01/01/15 0902  WBC 4.6 5.0 6.0 6.2  HGB 7.3* 7.3* 7.8* 8.4*  HCT 22.4* 22.9* 23.7* 23.2*  MCV 86.5 86.7 89.1 91.0  PLT 90* 91* 105* 108*   Cardiac Enzymes:  Recent Labs Lab  01/01/15 1900 01/02/15 0050 01/02/15 0439  TROPONINI 0.60* 0.62* 0.56*   BNP: BNP (last 3 results)  Recent Labs  08/05/14 2113 11/04/14 0120  BNP 1301.3* 2287.4*    ProBNP (last 3 results) No results for input(s): PROBNP in the last 8760 hours.  CBG:  Recent Labs Lab 01/03/15 1042 01/03/15 1609 01/03/15 2128 01/04/15 0805 01/04/15 1214  GLUCAP 231* 124* 150* 102* 154*       Signed:  Nita Sells  [facility] 01/04/2015, 12:38 PM

## 2015-01-04 NOTE — Progress Notes (Signed)
Discharge instructions given, pt verbalized understanding.  VSS. Denies pain.

## 2015-01-04 NOTE — Consult Note (Signed)
   [facility] CM Inpatient Consult   01/04/2015  JAIVER DENI 1945-10-31 [id number] Came by to speak with patient regarding [facility] services as a benefit of his Medicare insurance,  after evaluation. Spoke with inpatient RNCM on community barriers.  Came to the patient's room but was not in the room.  Belongings remain.  Will attempt to follow up for care management services.  Patient with hx of HF; CABG; NSTEMI, Renal failure and from [facility]. Of note, [facility] Care Management does not replace or interfere with any services needed for this patient.  Natividad Brood, RN BSN [facility] Liaison  [phone] business mobile phone

## 2015-01-04 NOTE — Progress Notes (Signed)
  Assessment/Plan: 1 ESRD for HD today and HD scheduled at [facility] TTS second shift. 2 Anemia on esa/fe 3 HPth on meds 4 Pneu resolving 5 DM controlled 6 CAD stable P HD as OP  Subjective: Interval History: feels good  Objective: Vital signs in last 24 hours: Temp:  [97.6 F (36.4 C)-98.1 F (36.7 C)] 97.6 F (36.4 C) (11/28 1354) Pulse Rate:  [71-96] 82 (11/28 1500) Resp:  [16-18] 16 (11/28 1500) BP: (122-171)/(52-93) 148/75 mmHg (11/28 1500) SpO2:  [96 %-98 %] 98 % (11/28 1008) Weight:  [86.229 kg (190 lb 1.6 oz)-87.8 kg (193 lb 9 oz)] 87.8 kg (193 lb 9 oz) (11/28 1354) Weight change:   Intake/Output from previous day: 11/27 0701 - 11/28 0700 In: 2762 [P.O.:2542; IV Piggyback:220] Out: 0  Intake/Output this shift:    General appearance: alert and cooperative Resp: clear to auscultation bilaterally Cardio: regular rate and rhythm, S1, S2 normal, no murmur, click, rub or gallop Extremities: extremities normal, atraumatic, no cyanosis or edema AVF LUE  Lab Results:  Recent Labs  01/04/15 1415  WBC 6.4  HGB 8.5*  HCT 26.3*  PLT 115*   BMET:  Recent Labs  01/04/15 1415  NA 138  K 3.8  CL 107  CO2 23  GLUCOSE 113*  BUN 42*  CREATININE 4.63*  CALCIUM 8.3*   No results for input(s): PTH in the last 72 hours. Iron Studies: No results for input(s): IRON, TIBC, TRANSFERRIN, FERRITIN in the last 72 hours. Studies/Results: No results found.  Scheduled: . aspirin EC  81 mg Oral Daily  . calcitRIOL  0.25 mcg Oral Daily  . carvedilol  3.125 mg Oral BID  . darbepoetin (ARANESP) injection - DIALYSIS  200 mcg Subcutaneous Q Mon-HD  . dextromethorphan-guaiFENesin  1 tablet Oral BID  . feeding supplement (NEPRO CARB STEADY)  237 mL Oral BID BM  . ferric gluconate (FERRLECIT/NULECIT) IV  125 mg Intravenous Daily  . insulin aspart  0-15 Units Subcutaneous TID WC  . insulin aspart  0-5 Units Subcutaneous QHS  . insulin glargine  10 Units Subcutaneous  QHS  . isosorbide mononitrate  90 mg Oral QHS  . multivitamin  1 tablet Oral QHS  . pregabalin  75 mg Oral BID  . ranolazine  1,000 mg Oral BID  . rosuvastatin  10 mg Oral QHS  . sodium chloride  3 mL Intravenous Q12H  . sodium chloride  3 mL Intravenous Q12H  . ticagrelor  90 mg Oral BID     LOS: 7 days   Tangy Drozdowski C 01/04/2015,4:36 PM

## 2015-01-04 NOTE — Care Management Important Message (Signed)
Important Message  Patient Details  Name: Joseph [name] MRN: [id number] Date of Birth: 05-Nov-1945   Medicare Important Message Given:  Yes    [name] 01/04/2015, 2:43 PM

## 2015-01-04 NOTE — Progress Notes (Addendum)
01/04/2015 2:13 PM Hemodialysis Outpatient Note; I have been trying since last week to arrange an outpatient schedule for this patient at the [facility] center on [address] (ph # [phone]), and although they are familiar with this patient and see no issues with starting dialysis on him again the hold up right now is Dr Eduard Clos (nephrologist at [facility])-wants to understand why patient is starting dialysis again. Thank  You. Gordy Savers  2:28 PM Update; just heard back from [facility] with a schedule for Mr Daughdrill. He will be Tuesday, Thursday and Saturday 2nd shift at the [facility] dialysis center. The center can begin treatment on Tuesday November 29 at 11:00AM. Thank you. Gordy Savers

## 2015-01-04 NOTE — Progress Notes (Signed)
Physical Therapy Treatment Patient Details Name: Joseph [name] MRN: [id number] DOB: 11-06-1945 Today's Date: 01/04/2015    History of Present Illness Patient is a 69 y/o male who presents with worsening CHF, cough and SOB, found to have right lower lung pneumonia, was placed on IV vancomycin and Zosyn. Briefly required vasopressors for hypotension on 11/20 and was transferred to ICU. Required HD during hospitalization. PMH includes NSTEMI, CABG, CKD, DM, chronic thrombocytopenia.    PT Comments    Pt progressing well towards all goals. Remains to have DOE however SpO2 >97% on RA. Pt functioning at mod I. Acute PT to con't to monitor to assist with energy conservation.  Follow Up Recommendations  No PT follow up     Equipment Recommendations  None recommended by PT    Recommendations for Other Services       Precautions / Restrictions Precautions Precautions: None Restrictions Weight Bearing Restrictions: No    Mobility  Bed Mobility Overal bed mobility: Modified Independent                Transfers Overall transfer level: Modified independent                  Ambulation/Gait Ambulation/Gait assistance: Modified independent (Device/Increase time) Ambulation Distance (Feet): 400 Feet Assistive device: None Gait Pattern/deviations: WFL(Within Functional Limits) Gait velocity: fair Gait velocity interpretation: at or above normal speed for age/gender General Gait Details: + SOB, SpO2>97%   Stairs            Wheelchair Mobility    Modified Rankin (Stroke Patients Only)       Balance Overall balance assessment: Modified Independent                                  Cognition Arousal/Alertness: Awake/alert Behavior During Therapy: WFL for tasks assessed/performed Overall Cognitive Status: Within Functional Limits for tasks assessed                      Exercises      General Comments        Pertinent  Vitals/Pain Pain Assessment: No/denies pain    Home Living                      Prior Function            PT Goals (current goals can now be found in the care plan section) Acute Rehab PT Goals Patient Stated Goal: stop DIALYSIS Progress towards PT goals: Progressing toward goals    Frequency  Min 2X/week    PT Plan Frequency needs to be updated    Co-evaluation             End of Session   Activity Tolerance: Patient tolerated treatment well Patient left: in chair;with call bell/phone within reach     Time: 1050-1107 PT Time Calculation (min) (ACUTE ONLY): 17 min  Charges:  $Gait Training: 8-22 mins                    G Codes:      Kingsley Callander 01/04/2015, 11:13 AM   Kittie Plater, PT, DPT Pager #: [phone] Office #: [phone]

## 2015-01-05 NOTE — Discharge Summary (Signed)
Physician Discharge Summary  Joseph [name] [id number] DOB: [birth year] DOA: 12/28/2014  PCP: Marden Noble, MD  Admit date: 12/28/2014 Discharge date: 01/05/2015  Time spent: 45 minutes  Recommendations for Outpatient Follow-up:  1. Will need Chem-7 as well as CBC on discharge 2. Recommend close outpatient follow-up 3. Recommend outpatient chest x-ray if patient persistently coughing-he completed a course of antibiotics during hospital stay and does not require further antibiotics 4. Patient will be discharging home as is independent 5. Dialysis will be scheduled for him and he'll be given times and dates prior to discharge from hospital by dialysis coordinator 6. 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 Note medication changed on d/c Order on Discharge  - carvedilol (COREG) 3.125 MG tablet  Modify on Discharge  - insulin glargine (LANTUS) 100 UNIT/ML injection - pregabalin (LYRICA) 75 MG capsule  Don't Resume on Discharge  - carvedilol (COREG) 6.25 MG tablet - furosemide (LASIX) 40 MG tablet - hydrALAZINE (APRESOLINE) 50 MG tablet - sodium bicarbonate 650 MG tablet   Discharge Diagnoses:  Principal Problem:   RLL pneumonia Active Problems:   Type 2 diabetes mellitus with renal manifestations (HCC)   Anemia   Thrombocytopenia (HCC)   Ischemic cardiomyopathy   Essential hypertension   CAD s/p CABG 2001, prior POBA, PCI/DES July 2016   Chronic kidney disease   Orthostatic hypotension   Syncope   Discharge Condition:  fair  Diet recommendation:  renal heart healthy  Filed Weights   01/03/15 2131 01/04/15 1354 01/04/15 1800  Weight: 86.229 kg (190 lb 1.6 oz) 87.8 kg (193 lb 9 oz) 86.8 kg (191 lb 5.8 oz)    History of present illness:   69 y/o ? 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  He was hydrated aggressively for his hypotension after being admitted.  He was placed on pressors for a short duration on 11/20.  He also received 1 U PRBC 11/19. He was previously on dialysis from 10/15 through 3/16.  He was started on dialysis again on 11/18.  transferred to [facility] on 11/21 due to patient preference.    Hospital Course:  RLL pneumonia / acute respiratory failure - initially RX with IV Levaquin and then transitoned to Vancomycin and Zosyn on 11/21 de-escalated Abx back to levaquin 11/23 Monitor fever curve and get CBC + Diff am CXR 11/23 showed streaky atelectasis and no fevers therefore discontinued Levaquin 11/24 -Patient given prescription for Mucinex on discharge  Septic shock/ syncope due to hypotension - Improved with treatment of underlying infection  -stable  ESRD on HD-new to dialysis and will be clipped at [facility] - has been off on dialysis for 1 yr and was stable - 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- dialysis resumed - being followed by renal team- dialysis to occur next on 11/25 - awaiting clip for dialysis in [place] at present  CAD s/p CABG PCI/DES July 2016 - cont Brilinta and ASA- needs dual antiplatelet therapy indefinitely per cardiology - on Imdur and Ranexa 1000 bid -had CP on 11/25 with minimal elevation of the troponin's, EKG relatively uinchanged -nursing informed to cycle troponin if repeated cp and  give nitro - no further recurrences  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 - fluid balance per dialysis, cont Coreg 3.125 bud, Imdur 90 QHS - Dialysis 11/25   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 - cont lantus 10 U and Novolog moderate  SSI - CBG's 105-252 - had episodic low blood sugars in the 60s on 11/26 which is now resolved  - monitor blood sugar trends   Anemia with iron deficiency  - Iron and Aranesp 200 mcg q mon per nephrology   Thrombocytopenia/MGUS  -chronic-has had a normal liver and rectal Biopsy 2015 -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  Prostate Ca s/p Seed Stable currently  Liver fibrosis c Portal HTN stable   Consultations:   nephrology  Discharge Exam: Filed Vitals:   01/04/15 1730 01/04/15 1800  BP: 142/70 145/68  Pulse: 77 76  Temp:  98.1 F (36.7 C)  Resp:  18    General:  doing fair still mild cough, no fever no chills, no other complaints ambulatory  Cardiovascular:  S1-S2 no murmur rub or gallop Respiratory:  clinically clear no added sound  Discharge Instructions   Discharge Instructions    Diet - low sodium heart healthy    Complete by:  As directed      Discharge instructions    Complete by:  As directed   Please note changes to your medications-there will be quite a few meds that have changed We will attempt to get you schedule for dialysis and you should know what is your going to dialysis on discharge.     Increase activity slowly    Complete by:  As directed           Discharge Medication List as of 01/04/2015  5:05 PM    START taking these medications   Details  dextromethorphan-guaiFENesin (MUCINEX DM) 30-600 MG 12hr tablet Take 1 tablet by mouth 2 (two) times daily., Starting 01/04/2015, Until Discontinued, Normal      CONTINUE these medications which have CHANGED   Details  carvedilol (COREG) 3.125 MG tablet Take 1 tablet (3.125 mg total) by mouth 2 (two) times daily with a meal., Starting 01/04/2015, Until Discontinued, No Print    insulin glargine (LANTUS) 100 UNIT/ML injection Inject 0.1 mLs (10 Units total) into the skin at bedtime., Starting 01/04/2015, Until Discontinued, Print    pregabalin (LYRICA) 75 MG capsule Take 1 capsule (75  mg total) by mouth daily., Starting 01/04/2015, Until Discontinued, No Print      CONTINUE these medications which have NOT CHANGED   Details  ALPRAZolam (XANAX) 0.5 MG tablet Take 0.5 mg by mouth as needed for anxiety., Until Discontinued, Historical Med    aspirin EC 81 MG tablet Take 81 mg by mouth daily., Until Discontinued, Historical Med    calcitRIOL (ROCALTROL) 0.25 MCG capsule Take 0.25 mcg by mouth daily. , Until Discontinued, Historical Med    ferrous sulfate 325 (65 FE) MG EC tablet Take 325 mg by mouth daily with breakfast. , Until Discontinued, Historical Med    isosorbide mononitrate (IMDUR) 60 MG 24 hr tablet Take 1.5 tablets (90 mg total) by mouth daily., Starting 12/09/2014, Until Discontinued, Normal    nitroGLYCERIN (NITROLINGUAL) 0.4 MG/SPRAY spray Place 1 spray under the tongue every 5 (five) minutes x 3 doses as needed for chest pain. Do not use together with sublingual nitro, Starting 08/08/2014, Until Discontinued, Normal    ranolazine (RANEXA) 1000 MG SR tablet Take 1  tablet (1,000 mg total) by mouth 2 (two) times daily., Starting 09/11/2014, Until Discontinued, Normal    rosuvastatin (CRESTOR) 20 MG tablet Take 20 mg by mouth at bedtime. , Until Discontinued, Historical Med    ticagrelor (BRILINTA) 90 MG TABS tablet Take 1 tablet (90 mg total) by mouth 2 (two) times daily., Starting 12/11/2014, Until Discontinued, Normal      STOP taking these medications     furosemide (LASIX) 40 MG tablet      hydrALAZINE (APRESOLINE) 50 MG tablet      sodium bicarbonate 650 MG tablet        Allergies  Allergen Reactions  . Sulfa Antibiotics Itching    hives  . Morphine And Related     Makes patient feel weird      The results of significant diagnostics from this hospitalization (including imaging, microbiology, ancillary and laboratory) are listed below for reference.    Significant Diagnostic Studies: Dg Chest 2 View  12/30/2014  CLINICAL DATA:  Pneumonia  EXAM: CHEST  2 VIEW COMPARISON:  12/27/2014 FINDINGS: Cardiomediastinal silhouette is stable. Status post CABG. Left IJ central line is unchanged in position. There is streaky right infrahilar atelectasis, infiltrate or bronchitic changes. No pulmonary edema. Degenerative changes thoracic spine. IMPRESSION: Streaky right infrahilar atelectasis, infiltrate or bronchitic changes. No pulmonary edema. Degenerative changes thoracic spine. Electronically Signed   By: Lahoma Crocker M.D.   On: 12/30/2014 10:59   Dg Chest 2 View  12/23/2014  CLINICAL DATA:  Syncopal episode this morning, fell several times, cough, CHF, hypertension, diabetes mellitus, former smoker EXAM: CHEST  2 VIEW COMPARISON:  11/03/2014 FINDINGS: Enlargement of cardiac silhouette post CABG. Mediastinal contours and pulmonary vascularity normal. RIGHT basilar infiltrate consistent with pneumonia. Remaining lungs clear. No pleural effusion or pneumothorax. IMPRESSION: Enlargement of cardiac silhouette post CABG. RIGHT basilar infiltrate consistent with pneumonia. Electronically Signed   By: Lavonia Dana M.D.   On: 12/23/2014 10:49   Ct Chest Wo Contrast  12/27/2014  CLINICAL DATA:  Shortness of breath. Prostate cancer. CABG. Former smoker. EXAM: CT CHEST WITHOUT CONTRAST TECHNIQUE: Multidetector CT imaging of the chest was performed following the standard protocol without IV contrast. COMPARISON:  03/04/2013 PET-CT.  12/23/2014 chest radiograph. FINDINGS: Images are motion degraded. Mediastinum/Nodes: Stable mild cardiomegaly. No pericardial fluid/thickening. Left main, left anterior descending, left circumflex and right coronary atherosclerosis, with ascending aortic and left internal mammary bypass grafts. Normal caliber thoracic aorta. Stable dilation of the main pulmonary artery (3.4 cm diameter). Normal visualized thyroid. Normal esophagus. No axillary adenopathy. Mildly enlarged 1.1 cm right paratracheal node (series 2/ image 13), previously 0.9  cm . Mildly enlarged 1.1 cm subcarinal node (2/26), increased from 0.9 cm. No gross hilar adenopathy on this noncontrast study. Lungs/Pleura: No pneumothorax. No pleural effusion. There is mild centrilobular emphysema. There are extensive patchy tree-in-bud opacities, patchy centrilobular nodular opacities and patchy peribronchovascular ground-glass opacity throughout both lungs, most prominent in the right upper, right middle and right lower lobes. Upper abdomen: Unremarkable. Musculoskeletal: No aggressive appearing focal osseous lesions. Median sternotomy wires are intact. Marked degenerative changes in the thoracic spine. IMPRESSION: 1. Extensive patchy tree-in-bud opacities, patchy centrilobular nodular opacities and patchy peribronchovascular ground-glass opacities throughout both lungs, asymmetrically involving the lobes of the right lung. These findings are most in keeping with an infectious bronchiolitis/developing bronchopneumonia, with the differential including bacterial, fungal or tuberculosis infections. 2. New mild mediastinal lymphadenopathy, nonspecific, likely reactive. 3. Mild centrilobular emphysema. 4. Left main and 3 vessel coronary atherosclerosis  status post CABG. 5. Stable dilated main pulmonary artery, suggesting chronic pulmonary arterial hypertension. Electronically Signed   By: Ilona Sorrel M.D.   On: 12/27/2014 10:44   Dg Chest Port 1 View  12/27/2014  CLINICAL DATA:  Patient status post PICC line placement. Interval re- adjustment. EXAM: PORTABLE CHEST 1 VIEW COMPARISON:  Earlier same day FINDINGS: Interval re- adjustment PICC line with tip projecting over the superior vena cava. Stable cardiomegaly status post median sternotomy and CABG procedure. Unchanged heterogeneous opacities right lung base most compatible with pneumonia. No pleural effusion or pneumothorax. IMPRESSION: Interval re- adjustment PICC line with tip projecting over the superior vena cava. Persistent  heterogeneous opacities right lung base favored to represent pneumonia. Cardiomegaly. Electronically Signed   By: Lovey Newcomer M.D.   On: 12/27/2014 16:20   Dg Chest Port 1 View  12/27/2014  CLINICAL DATA:  Patient status post PICC line placement. EXAM: PORTABLE CHEST 1 VIEW COMPARISON:  Chest radiograph 12/23/2014. FINDINGS: Interval insertion of PICC line with tip projecting over the expected location of the central left brachiocephalic vein near the confluence with the superior vena cava. Stable cardiac and mediastinal contours, enlarged. Pulmonary vascular redistribution. No frank pulmonary edema. Re- demonstrated heterogeneous opacities within the right mid lower lung. No pleural effusion or pneumothorax. IMPRESSION: New PICC line tip projects at the confluence of the left brachiocephalic vein and superior vena cava. Consider repositioning. Cardiomegaly and pulmonary vascular redistribution. Re- demonstrated right mid and lower lung heterogeneous opacities most compatible with pneumonia. Electronically Signed   By: Lovey Newcomer M.D.   On: 12/27/2014 16:12    Microbiology: Recent Results (from the past 240 hour(s))  MRSA PCR Screening     Status: None   Collection Time: 12/27/14  1:52 AM  Result Value Ref Range Status   MRSA by PCR NEGATIVE NEGATIVE Final    Comment:        The GeneXpert MRSA Assay (FDA approved for NASAL specimens only), is one component of a comprehensive MRSA colonization surveillance program. It is not intended to diagnose MRSA infection nor to guide or monitor treatment for MRSA infections.   C difficile quick scan w PCR reflex     Status: None   Collection Time: 12/29/14 11:28 AM  Result Value Ref Range Status   C Diff antigen NEGATIVE NEGATIVE Final   C Diff toxin NEGATIVE NEGATIVE Final   C Diff interpretation Negative for toxigenic C. difficile  Final     Labs: Basic Metabolic Panel:  Recent Labs Lab 12/30/14 0435 12/31/14 0329 01/01/15 0902  01/04/15 1415  NA 141 140 140 138  K 3.8 3.5 3.6 3.8  CL 105 105 107 107  CO2 26 23 23 23   GLUCOSE 97 92 194* 113*  BUN 53* 56* 49* 42*  CREATININE 5.08* 5.57* 5.44* 4.63*  CALCIUM 8.0* 8.1* 8.6* 8.3*  PHOS  --   --  5.1* 5.1*   Liver Function Tests:  Recent Labs Lab 01/01/15 0902 01/04/15 1415  ALBUMIN 2.8* 2.8*   No results for input(s): LIPASE, AMYLASE in the last 168 hours. No results for input(s): AMMONIA in the last 168 hours. CBC:  Recent Labs Lab 12/30/14 0435 12/31/14 0329 01/01/15 0902 01/04/15 1415  WBC 5.0 6.0 6.2 6.4  HGB 7.3* 7.8* 8.4* 8.5*  HCT 22.9* 23.7* 23.2* 26.3*  MCV 86.7 89.1 91.0 88.9  PLT 91* 105* 108* 115*   Cardiac Enzymes:  Recent Labs Lab 01/01/15 1900 01/02/15 0050 01/02/15 0439  TROPONINI 0.60*  0.62* 0.56*   BNP: BNP (last 3 results)  Recent Labs  08/05/14 2113 11/04/14 0120  BNP 1301.3* 2287.4*    ProBNP (last 3 results) No results for input(s): PROBNP in the last 8760 hours.  CBG:  Recent Labs Lab 01/03/15 1042 01/03/15 1609 01/03/15 2128 01/04/15 0805 01/04/15 1214  GLUCAP 231* 124* 150* 102* 154*       Signed:  Nita Sells  [facility] 01/05/2015, 5:51 PM

## 2015-01-06 ENCOUNTER — Emergency Department (HOSPITAL_COMMUNITY)
Admission: EM | Admit: 2015-01-06 | Discharge: 2015-01-06 | Disposition: A | Discharge status: Home / Self Care | Payer: Medicare Other | Attending: Emergency Medicine | Admitting: Emergency Medicine

## 2015-01-06 ENCOUNTER — Encounter (HOSPITAL_COMMUNITY): Payer: Self-pay | Admitting: Emergency Medicine

## 2015-01-06 ENCOUNTER — Telehealth: Payer: Self-pay | Admitting: Cardiology

## 2015-01-06 DIAGNOSIS — I5042 Chronic combined systolic (congestive) and diastolic (congestive) heart failure: Secondary | ICD-10-CM | POA: Insufficient documentation

## 2015-01-06 DIAGNOSIS — Z992 Dependence on renal dialysis: Secondary | ICD-10-CM | POA: Diagnosis not present

## 2015-01-06 DIAGNOSIS — Z9889 Other specified postprocedural states: Secondary | ICD-10-CM | POA: Insufficient documentation

## 2015-01-06 DIAGNOSIS — Z951 Presence of aortocoronary bypass graft: Secondary | ICD-10-CM | POA: Insufficient documentation

## 2015-01-06 DIAGNOSIS — I252 Old myocardial infarction: Secondary | ICD-10-CM | POA: Insufficient documentation

## 2015-01-06 DIAGNOSIS — I129 Hypertensive chronic kidney disease with stage 1 through stage 4 chronic kidney disease, or unspecified chronic kidney disease: Secondary | ICD-10-CM | POA: Diagnosis not present

## 2015-01-06 DIAGNOSIS — I2510 Atherosclerotic heart disease of native coronary artery without angina pectoris: Secondary | ICD-10-CM | POA: Diagnosis not present

## 2015-01-06 DIAGNOSIS — Z794 Long term (current) use of insulin: Secondary | ICD-10-CM | POA: Diagnosis not present

## 2015-01-06 DIAGNOSIS — Z7982 Long term (current) use of aspirin: Secondary | ICD-10-CM | POA: Diagnosis not present

## 2015-01-06 DIAGNOSIS — Z79899 Other long term (current) drug therapy: Secondary | ICD-10-CM | POA: Diagnosis not present

## 2015-01-06 DIAGNOSIS — D696 Thrombocytopenia, unspecified: Secondary | ICD-10-CM | POA: Insufficient documentation

## 2015-01-06 DIAGNOSIS — E119 Type 2 diabetes mellitus without complications: Secondary | ICD-10-CM | POA: Diagnosis not present

## 2015-01-06 DIAGNOSIS — Z8546 Personal history of malignant neoplasm of prostate: Secondary | ICD-10-CM | POA: Diagnosis not present

## 2015-01-06 DIAGNOSIS — N184 Chronic kidney disease, stage 4 (severe): Secondary | ICD-10-CM | POA: Insufficient documentation

## 2015-01-06 DIAGNOSIS — Z8619 Personal history of other infectious and parasitic diseases: Secondary | ICD-10-CM | POA: Diagnosis not present

## 2015-01-06 DIAGNOSIS — Z87891 Personal history of nicotine dependence: Secondary | ICD-10-CM | POA: Insufficient documentation

## 2015-01-06 DIAGNOSIS — R2241 Localized swelling, mass and lump, right lower limb: Secondary | ICD-10-CM | POA: Diagnosis not present

## 2015-01-06 DIAGNOSIS — Z8719 Personal history of other diseases of the digestive system: Secondary | ICD-10-CM | POA: Insufficient documentation

## 2015-01-06 DIAGNOSIS — Z9861 Coronary angioplasty status: Secondary | ICD-10-CM | POA: Diagnosis not present

## 2015-01-06 DIAGNOSIS — Z8701 Personal history of pneumonia (recurrent): Secondary | ICD-10-CM | POA: Insufficient documentation

## 2015-01-06 DIAGNOSIS — F418 Other specified anxiety disorders: Secondary | ICD-10-CM | POA: Diagnosis not present

## 2015-01-06 DIAGNOSIS — E7800 Pure hypercholesterolemia, unspecified: Secondary | ICD-10-CM | POA: Diagnosis not present

## 2015-01-06 NOTE — ED Provider Notes (Signed)
CSN: [id number]     Arrival date & time 01/06/15  1114 History  By signing my name below, I, [name], attest that this documentation has been prepared under the direction and in the presence of [name], PA-C. Electronically Signed: [name], ED Scribe. 01/06/2015. 11:55 AM.    Chief Complaint  Patient presents with  . knot on leg    The history is provided by the patient. No language interpreter was used.    HPI Comments: Joseph [name] is a 69 y.o. male with a history of ESRD currently undergoing dialysis, who presents to the Emergency Department complaining of a constant knot on his right lower leg that he first noticed last night. He reports he was hospitalized last week for pneumonia and has concerns that he may have developed a blood clot. He states he had a syncopal episode 2 weeks ago prior to being hospitalized and fell, noting an abrasion on his right knee. He otherwise denies any known trauma or injury to his leg.   Past Medical History  Diagnosis Date  . Hypertension   . Hypercholesterolemia   . Prostate cancer ([place])   . Hepatosplenomegaly 2015    fibrosis, no cirrhosis on 06/2013 liver biopsy  . Thrombocytopenia ([place]) 2015  . Myocardial infarction ([place]) 2001  . Depression with anxiety   . IDDM (insulin dependent diabetes mellitus) ([place])     INSULIN DEPENDENT  . GERD (gastroesophageal reflux disease)   . History of shingles   . Ischemic cardiomyopathy     a. echo 08/06/2014: EF 25-30%, multiple WMA, mod DD, PASP 49 mm Hg   . CKD (chronic kidney disease), stage IV (HCC)     a. previously on dialysis; b. followed by Dr. Zulay Corrie Butts   . Coronary artery disease, occlusive     a. s/p 4v CABG 2001(LIMA-LAD, SVG-D2, SVG-OM1, SVG-right PDA, SVG-right PL1); b. cath 07/30/2014 vein grafts all down, patent LIMA to LAD, significant LM and ost LCx dx; c. cath 08/07/2014 Synergy DES 2.5 mm x 18 mm to dist LM and ost LCx, rec lifelong DAPT  . Coronary artery disease involving  coronary bypass graft 08/2014    all vein grafts occluded. Patent LIMA-LAD -- s/p PCI ot LM-Cx;   [name] Chronic combined systolic and diastolic CHF, NYHA class 2 ([place])     a. echo 08/2013: EF 40-45%, impaired relaxation, mild MR, LA mildly dilated,    . Gallstone pancreatitis 2015   Past Surgical History  Procedure Laterality Date  . Cardiac surgery    . Shoulder arthroscopy    . Penile prosthesis implant    . Myocardial infarction    . Cardiac catheterization    . Coronary angioplasty    . Coronary artery bypass graft  2001  . Colonoscopy    . Liver biopsy    . Av fistula placement Left 2015  . Cardiac catheterization N/A 07/30/2014    Procedure: Left Heart Cath and Cors/Grafts Angiography;  Surgeon: Dionisio David, MD;  Location: [place] [facility];  Service: Cardiovascular;  Laterality: N/A;  . Cardiac catheterization N/A 08/07/2014    Procedure: Coronary Stent Intervention;  Surgeon: Lorretta Harp, MD;  Location: [place] [facility];  Service: Cardiovascular;  Laterality: N/A;   Family History  Problem Relation Age of Onset  . Acute myelogenous leukemia Brother   . CAD Brother   . Diabetes Mellitus II Brother   . Heart disease Father 71    CABG   Social History  Substance Use Topics  . Smoking status: Former Smoker    Quit date: 02/07/2008  . Smokeless tobacco: Former Systems developer  . Alcohol Use: No    Review of Systems  Cardiovascular: Negative for leg swelling.  Musculoskeletal:       Mass on right medial lower leg  Neurological: Negative for weakness.      Allergies  Sulfa antibiotics and Morphine and related  Home Medications   Prior to Admission medications   Medication Sig Start Date End Date Taking? Authorizing Provider  ALPRAZolam Duanne Moron) 0.5 MG tablet Take 0.5 mg by mouth as needed for anxiety.   Yes Historical Provider, MD  aspirin EC 81 MG tablet Take 81 mg by mouth daily.   Yes Historical Provider, MD  calcitRIOL (ROCALTROL) 0.25 MCG capsule Take 0.25 mcg  by mouth daily.    Yes Historical Provider, MD  carvedilol (COREG) 3.125 MG tablet Take 1 tablet (3.125 mg total) by mouth 2 (two) times daily with a meal. 01/04/15  Yes Nita Sells, MD  dextromethorphan-guaiFENesin (MUCINEX DM) 30-600 MG 12hr tablet Take 1 tablet by mouth 2 (two) times daily. 01/04/15  Yes Nita Sells, MD  ferrous sulfate 325 (65 FE) MG EC tablet Take 325 mg by mouth daily with breakfast.    Yes Historical Provider, MD  insulin glargine (LANTUS) 100 UNIT/ML injection Inject 0.1 mLs (10 Units total) into the skin at bedtime. 01/04/15  Yes Nita Sells, MD  isosorbide mononitrate (IMDUR) 60 MG 24 hr tablet Take 1.5 tablets (90 mg total) by mouth daily. 12/09/14  Yes Brett Canales, PA-C  nitroGLYCERIN (NITROLINGUAL) 0.4 MG/SPRAY spray Place 1 spray under the tongue every 5 (five) minutes x 3 doses as needed for chest pain. Do not use together with sublingual nitro 08/08/14  Yes Almyra Deforest, PA  pregabalin (LYRICA) 75 MG capsule Take 1 capsule (75 mg total) by mouth daily. 01/04/15  Yes Nita Sells, MD  ranolazine (RANEXA) 1000 MG SR tablet Take 1 tablet (1,000 mg total) by mouth 2 (two) times daily. 09/11/14  Yes Ryan M Dunn, PA-C  rosuvastatin (CRESTOR) 20 MG tablet Take 20 mg by mouth at bedtime.    Yes Historical Provider, MD  ticagrelor (BRILINTA) 90 MG TABS tablet Take 1 tablet (90 mg total) by mouth 2 (two) times daily. 12/11/14  Yes Einar Pheasant Hager, PA-C   BP 111/59 mmHg  Pulse 70  Temp(Src) 97.6 F (36.4 C) (Oral)  Resp 17  SpO2 98% Physical Exam  Constitutional: He is oriented to person, place, and time. He appears well-developed and well-nourished. No distress.  HENT:  Head: Normocephalic and atraumatic.  Eyes: Conjunctivae and EOM are normal.  Neck: Neck supple. No tracheal deviation present.  Cardiovascular: Normal rate.   Pulmonary/Chest: Effort normal. No respiratory distress.  Musculoskeletal: Normal range of motion.   Left arm: AV fistula.  Palpable thrill.   On medial right leg: Tender mass on 4 cm x 4 cm firm, immovable mass that is mildly TTP. No calf tenderness. Pulses are intact. No swelling.   Neurological: He is alert and oriented to person, place, and time.  Skin: Skin is warm and dry.  Psychiatric: He has a normal mood and affect. His behavior is normal.  Nursing note and vitals reviewed.   ED Course  Procedures (including critical care time)  DIAGNOSTIC STUDIES: Oxygen Saturation is 98% on RA, normal by my interpretation.    COORDINATION OF CARE: 11:43 AM - Discussed treatment plan with pt at bedside. Pt verbalized  understanding and agreed to plan.   MDM   Final diagnoses:  None   Patient was seen in shared visit with Dr. Wilson Singer. This appears to be a firm mass rooted in the deeper tissues. Differential includes cyst vs cancerous mass vs benign mass. Does not appear to be infection, DVT, or calciphylaxis. Pt advised to set up appointment with [facility] for biopsy. Watch for signs of infection or DVT, which we reviewed. Discussed return precautions to the ED. Pt and his daughter verbalized understanding and agreed to plan.     I personally performed the services described in this documentation, which was scribed in my presence. The recorded information has been reviewed and is accurate.          [name], PA-C 01/06/15 1157  Virgel Manifold, MD 01/09/15 2100

## 2015-01-06 NOTE — Discharge Instructions (Signed)
Cellulitis Cellulitis is an infection of the skin and the tissue beneath it. The infected area is usually red and tender. Cellulitis occurs most often in the arms and lower legs.  CAUSES  Cellulitis is caused by bacteria that enter the skin through cracks or cuts in the skin. The most common types of bacteria that cause cellulitis are staphylococci and streptococci. SIGNS AND SYMPTOMS   Redness and warmth.  Swelling.  Tenderness or pain.  Fever. DIAGNOSIS  Your health care provider can usually determine what is wrong based on a physical exam. Blood tests may also be done. TREATMENT  Treatment usually involves taking an antibiotic medicine. HOME CARE INSTRUCTIONS   Take your antibiotic medicine as directed by your health care provider. Finish the antibiotic even if you start to feel better.  Keep the infected arm or leg elevated to reduce swelling.  Apply a warm cloth to the affected area up to 4 times per day to relieve pain.  Take medicines only as directed by your health care provider.  Keep all follow-up visits as directed by your health care provider. SEEK MEDICAL CARE IF:   You notice red streaks coming from the infected area.  Your red area gets larger or turns dark in color.  Your bone or joint underneath the infected area becomes painful after the skin has healed.  Your infection returns in the same area or another area.  You notice a swollen bump in the infected area.  You develop new symptoms.  You have a fever. SEEK IMMEDIATE MEDICAL CARE IF:   You feel very sleepy.  You develop vomiting or diarrhea.  You have a general ill feeling (malaise) with muscle aches and pains.   This information is not intended to replace advice given to you by your health care provider. Make sure you discuss any questions you have with your health care provider.   Document Released: 11/02/2004 Document Revised: 10/14/2014 Document Reviewed: 04/10/2011 Elsevier Interactive  Patient Education 2016 [place].     Deep Vein Thrombosis A deep vein thrombosis (DVT) is a blood clot (thrombus) that usually occurs in a deep, larger vein of the lower leg or the pelvis, or in an upper extremity such as the arm. These are dangerous and can lead to serious and even life-threatening complications if the clot travels to the lungs. A DVT can damage the valves in your leg veins so that instead of flowing upward, the blood pools in the lower leg. This is called post-thrombotic syndrome, and it can result in pain, swelling, discoloration, and sores on the leg. CAUSES A DVT is caused by the formation of a blood clot in your leg, pelvis, or arm. Usually, several things contribute to the formation of blood clots. A clot may develop when:  Your blood flow slows down.  Your vein becomes damaged in some way.  You have a condition that makes your blood clot more easily. RISK FACTORS A DVT is more likely to develop in:  People who are older, especially over 25 years of age.  People who are overweight (obese).  People who sit or lie still for a long time, such as during long-distance travel (over 4 hours), bed rest, hospitalization, or during recovery from certain medical conditions like a stroke.  People who do not engage in much physical activity (sedentary lifestyle).  People who have chronic breathing disorders.  People who have a personal or family history of blood clots or blood clotting disease.  People who have  peripheral vascular disease (PVD), diabetes, or some types of cancer.  People who have heart disease, especially if the person had a recent heart attack or has congestive heart failure.  People who have neurological diseases that affect the legs (leg paresis).  People who have had a traumatic injury, such as breaking a hip or leg.  People who have recently had major or lengthy surgery, especially on the hip, knee, or abdomen.  People who have had a  central line placed inside a large vein.  People who take medicines that contain the hormone estrogen. These include birth control pills and hormone replacement therapy.  Pregnancy or during childbirth or the postpartum period.  Long plane flights (over 8 hours). SIGNS AND SYMPTOMS Symptoms of a DVT can include:   Swelling of your leg or arm, especially if one side is much worse.  Warmth and redness of your leg or arm, especially if one side is much worse.  Pain in your arm or leg. If the clot is in your leg, symptoms may be more noticeable or worse when you stand or walk.  A feeling of pins and needles, if the clot is in the arm. The symptoms of a DVT that has traveled to the lungs (pulmonary embolism, PE) usually start suddenly and include:  Shortness of breath while active or at rest.  Coughing or coughing up blood or blood-tinged mucus.  Chest pain that is often worse with deep breaths.  Rapid or irregular heartbeat.  Feeling light-headed or dizzy.  Fainting.  Feeling anxious.  Sweating. There may also be pain and swelling in a leg if that is where the blood clot started. These symptoms may represent a serious problem that is an emergency. Do not wait to see if the symptoms will go away. Get medical help right away. Call your local emergency services (911 in the [place] Do not drive yourself to the hospital. DIAGNOSIS Your health care provider will take a medical history and perform a physical exam. You may also have other tests, including:  Blood tests to assess the clotting properties of your blood.  Imaging tests, such as CT, ultrasound, MRI, X-ray, and other tests to see if you have clots anywhere in your body. TREATMENT After a DVT is identified, it can be treated. The type of treatment that you receive depends on many factors, such as the cause of your DVT, your risk for bleeding or developing more clots, and other medical conditions that you have. Sometimes, a  combination of treatments is necessary. Treatment options may be combined and include:  Monitoring the blood clot with ultrasound.  Taking medicines by mouth, such as newer blood thinners (anticoagulants), thrombolytics, or warfarin.  Taking anticoagulant medicine by injection or through an IV tube.  Wearing compression stockings or using different types ofdevices.  Surgery (rare) to remove the blood clot or to place a filter in your abdomen to stop the blood clot from traveling to your lungs. Treatments for a DVT are often divided into immediate treatment and long-term treatment (up to 3 months after DVT). You can work with your health care provider to choose the treatment program that is best for you. HOME CARE INSTRUCTIONS If you are taking a newer oral anticoagulant:  Take the medicine every single day at the same time each day.  Understand what foods and drugs interact with this medicine.  Understand that there are no regular blood tests required when using this medicine.  Understand the side effects of this  medicine, including excessive bruising or bleeding. Ask your health care provider or pharmacist about other possible side effects. If you are taking warfarin:  Understand how to take warfarin and know which foods can affect how warfarin works in Veterinary surgeon.  Understand that it is dangerous to take too much or too little warfarin. Too much warfarin increases the risk of bleeding. Too little warfarin continues to allow the risk for blood clots.  Follow your PT and INR blood testing schedule. The PT and INR results allow your health care provider to adjust your dose of warfarin. It is very important that you have your PT and INR tested as often as told by your health care provider.  Avoid major changes in your diet, or tell your health care provider before you change your diet. Arrange a visit with a registered dietitian to answer your questions. Many foods, especially foods that  are high in vitamin K, can interfere with warfarin and affect the PT and INR results. Eat a consistent amount of foods that are high in vitamin K, such as:  Spinach, kale, broccoli, cabbage, collard greens, turnip greens, Brussels sprouts, peas, cauliflower, seaweed, and parsley.  Beef liver and pork liver.  Green tea.  Soybean oil.  Tell your health care provider about any and all medicines, vitamins, and supplements that you take, including aspirin and other over-the-counter anti-inflammatory medicines. Be especially cautious with aspirin and anti-inflammatory medicines. Do not take those before you ask your health care provider if it is safe to do so. This is important because many medicines can interfere with warfarin and affect the PT and INR results.  Do not start or stop taking any over-the-counter or prescription medicine unless your health care provider or pharmacist tells you to do so. If you take warfarin, you will also need to do these things:  Hold pressure over cuts for longer than usual.  Tell your dentist and other health care providers that you are taking warfarin before you have any procedures in which bleeding may occur.  Avoid alcohol or drink very small amounts. Tell your health care provider if you change your alcohol intake.  Do not use tobacco products, including cigarettes, chewing tobacco, and e-cigarettes. If you need help quitting, ask your health care provider.  Avoid contact sports. General Instructions  Take over-the-counter and prescription medicines only as told by your health care provider. Anticoagulant medicines can have side effects, including easy bruising and difficulty stopping bleeding. If you are prescribed an anticoagulant, you will also need to do these things:  Hold pressure over cuts for longer than usual.  Tell your dentist and other health care providers that you are taking anticoagulants before you have any procedures in which bleeding  may occur.  Avoid contact sports.  Wear a medical alert bracelet or carry a medical alert card that says you have had a PE.  Ask your health care provider how soon you can go back to your normal activities. Stay active to prevent new blood clots from forming.  Make sure to exercise while traveling or when you have been sitting or standing for a long period of time. It is very important to exercise. Exercise your legs by walking or by tightening and relaxing your leg muscles often. Take frequent walks.  Wear compression stockings as told by your health care provider to help prevent more blood clots from forming.  Do not use tobacco products, including cigarettes, chewing tobacco, and e-cigarettes. If you need help quitting, ask  your health care provider.  Keep all follow-up appointments with your health care provider. This is important. PREVENTION Take these actions to decrease your risk of developing another DVT:  Exercise regularly. For at least 30 minutes every day, engage in:  Activity that involves moving your arms and legs.  Activity that encourages good blood flow through your body by increasing your heart rate.  Exercise your arms and legs every hour during long-distance travel (over 4 hours). Drink plenty of water and avoid drinking alcohol while traveling.  Avoid sitting or lying in bed for long periods of time without moving your legs.  Maintain a weight that is appropriate for your height. Ask your health care provider what weight is healthy for you.  If you are a woman who is over 36 years of age, avoid unnecessary use of medicines that contain estrogen. These include birth control pills.  Do not smoke, especially if you take estrogen medicines. If you need help quitting, ask your health care provider. If you are hospitalized, prevention measures may include:  Early walking after surgery, as soon as your health care provider says that it is safe.  Receiving  anticoagulants to prevent blood clots.If you cannot take anticoagulants, other options may be available, such as wearing compression stockings or using different types of devices. SEEK IMMEDIATE MEDICAL CARE IF:  You have new or increased pain, swelling, or redness in an arm or leg.  You have numbness or tingling in an arm or leg.  You have shortness of breath while active or at rest.  You have chest pain.  You have a rapid or irregular heartbeat.  You feel light-headed or dizzy.  You cough up blood.  You notice blood in your vomit, bowel movement, or urine. These symptoms may represent a serious problem that is an emergency. Do not wait to see if the symptoms will go away. Get medical help right away. Call your local emergency services (911 in the [place] Do not drive yourself to the hospital.   This information is not intended to replace advice given to you by your health care provider. Make sure you discuss any questions you have with your health care provider.   Document Released: 01/23/2005 Document Revised: 10/14/2014 Document Reviewed: 05/20/2014 Elsevier Interactive Patient Education 2016 [employer]    Skin Biopsy WHY AM I HAVING THIS TEST? This test examines skin tissue under a microscope. Your health care provider may perform this test to identify the source of a skin rash or lesion if an allergy is suspected. In this test, a tissue sample (biopsy) is taken to look at your skin's anatomy and to see if certain proteins and antibodies are present. WHAT KIND OF SAMPLE IS TAKEN? A small sample of skin is required for this test. It is usually collected by numbing an area of skin and removing a small circle of skin. HOW DO I PREPARE FOR THE TEST? There is no preparation required for this test. HOW ARE THE TEST RESULTS REPORTED? Your results may be reported in different ways and are dependent upon the kind of test that is performed. It is your responsibility to obtain your  test results. Ask the lab or department performing the test when and how you will get your results. It is most common for your results to be reported as:  Normal skin anatomy (histology).  No evidence of IgG, IgA, or IgM antibody, complement C3, or fibrinogen. WHAT DO THE RESULTS MEAN? Abnormal findings may indicate certain autoimmune  diseases. This test can produce many different results. It is important to talk with your health care provider to discuss your results, treatment options, and if necessary, the need for more tests. Talk with your health care provider if you have any questions about your results.   This information is not intended to replace advice given to you by your health care provider. Make sure you discuss any questions you have with your health care provider.   Document Released: 02/25/2004 Document Revised: 02/13/2014 Document Reviewed: 04/22/2014 Elsevier Interactive Patient Education [employer].

## 2015-01-06 NOTE — ED Provider Notes (Signed)
Medical screening examination/treatment/procedure(s) were conducted as a shared visit with non-physician practitioner(s) and myself.  I personally evaluated the patient during the encounter.   EKG Interpretation None     69 year old male with spherical/ovoid mass to his right lower extremity. This is just distal and medial to the right knee. Little bit smaller than the size of a golf ball. Firm. Mobile. Relatively acute onset. Denies trauma. Doubt infectious etiology. No overlying skin changes. Atypical for DVT and no additional findings to support one. Consider sebaceous cyst but this seems to be deeper in the soft tissues. At this point, I do not feel that requires any emergent intervention or workup. Advised patient to monitor it at this time. Recommending surgical follow-up for possible biopsy if enlarges or persists beyond a couple weeks. Reevaluation sooner if starts developing generalized swelling, redness, drainage, fever or other any concerning signs or symptoms.  Virgel Manifold, MD 01/06/15 [phone]

## 2015-01-06 NOTE — Telephone Encounter (Signed)
Spoke to daughter.  Daughter states her father notice an knot on his leg - ( slightly sore) . Patient went to  Primary but primary is not in office Daughter wanted to know if patient should come to office or got hospital.   Daughter states patient is in route to [facility] from [place]. Daughter states she is at hospital awaiting for patient. RN informed daughter to have patient to go to ER  or urgent care. She verbalized understanding.

## 2015-01-06 NOTE — ED Notes (Signed)
See PA assessment 

## 2015-01-06 NOTE — ED Notes (Signed)
Patient states knot on R leg.   Firm, immovable mass that is tender to touch.   Pulses intact.   No swelling around the area.

## 2015-01-20 ENCOUNTER — Ambulatory Visit: Payer: Medicare Other | Admitting: Cardiology

## 2015-02-25 ENCOUNTER — Other Ambulatory Visit: Payer: Self-pay

## 2015-02-25 ENCOUNTER — Ambulatory Visit (INDEPENDENT_AMBULATORY_CARE_PROVIDER_SITE_OTHER): Payer: Medicare Other | Admitting: Nurse Practitioner

## 2015-02-25 ENCOUNTER — Encounter: Payer: Self-pay | Admitting: Nurse Practitioner

## 2015-02-25 VITALS — BP 118/58 | HR 70 | Ht 71.0 in | Wt 201.2 lb

## 2015-02-25 DIAGNOSIS — I110 Hypertensive heart disease with heart failure: Secondary | ICD-10-CM

## 2015-02-25 DIAGNOSIS — I10 Essential (primary) hypertension: Secondary | ICD-10-CM

## 2015-02-25 DIAGNOSIS — Z9861 Coronary angioplasty status: Secondary | ICD-10-CM

## 2015-02-25 DIAGNOSIS — E7800 Pure hypercholesterolemia, unspecified: Secondary | ICD-10-CM | POA: Insufficient documentation

## 2015-02-25 DIAGNOSIS — I5022 Chronic systolic (congestive) heart failure: Secondary | ICD-10-CM

## 2015-02-25 DIAGNOSIS — I951 Orthostatic hypotension: Secondary | ICD-10-CM

## 2015-02-25 DIAGNOSIS — E785 Hyperlipidemia, unspecified: Secondary | ICD-10-CM | POA: Diagnosis not present

## 2015-02-25 DIAGNOSIS — I509 Heart failure, unspecified: Secondary | ICD-10-CM

## 2015-02-25 DIAGNOSIS — I255 Ischemic cardiomyopathy: Secondary | ICD-10-CM

## 2015-02-25 DIAGNOSIS — I2510 Atherosclerotic heart disease of native coronary artery without angina pectoris: Secondary | ICD-10-CM | POA: Diagnosis not present

## 2015-02-25 DIAGNOSIS — N186 End stage renal disease: Secondary | ICD-10-CM

## 2015-02-25 DIAGNOSIS — I119 Hypertensive heart disease without heart failure: Secondary | ICD-10-CM | POA: Insufficient documentation

## 2015-02-25 MED ORDER — FUROSEMIDE 40 MG PO TABS: 40.0000 mg | ORAL_TABLET | Freq: Every day | ORAL | Status: DC | PRN

## 2015-02-25 MED ORDER — ROSUVASTATIN CALCIUM 20 MG PO TABS: 20.0000 mg | ORAL_TABLET | Freq: Every day | ORAL | Status: AC

## 2015-02-25 MED ORDER — RANOLAZINE ER 500 MG PO TB12: 500.0000 mg | ORAL_TABLET | Freq: Two times a day (BID) | ORAL | Status: AC

## 2015-02-25 NOTE — Patient Instructions (Signed)
Medication Instructions:  Your physician has recommended you make the following change in your medication:  START taking lasix 40mg  once a day as needed   Labwork: none  Testing/Procedures: none  Follow-Up: Your physician wants you to follow-up in: 3 months with Dr. Ellyn Hack. You will receive a reminder letter in the mail two months in advance. If you don't receive a letter, please call our office to schedule the follow-up appointment.   Any Other Special Instructions Will Be Listed Below (If Applicable).     If you need a refill on your cardiac medications before your next appointment, please call your pharmacy.

## 2015-02-25 NOTE — Progress Notes (Signed)
Office Visit    Patient Name: Joseph [name] Date of Encounter: 02/25/2015  Primary Care Provider:  Marden Noble, MD Primary Cardiologist:  Roni Bread, MD   Chief Complaint    70 year old male with a history of ischemic myopathy, CAD, and heart failure, who presents for follow-up.  Past Medical History    Past Medical History  Diagnosis Date  . Hypertensive heart disease   . Hypercholesterolemia   . Prostate cancer ([place])   . Hepatosplenomegaly 2015    fibrosis, no cirrhosis on 06/2013 liver biopsy  . Thrombocytopenia ([place]) 2015  . Depression with anxiety   . IDDM (insulin dependent diabetes mellitus) ([place])     INSULIN DEPENDENT  . GERD (gastroesophageal reflux disease)   . History of shingles   . Ischemic cardiomyopathy     a. echo 08/06/2014: EF 25-30%, multiple WMA, mod DD, PASP 49 mm Hg;  b. 12/2014 Echo: EF 30-35%, diff HK, mild MR.  . CKD (chronic kidney disease), stage IV ([place])     a. previously on dialysis 11/2013->04/2014;  b. 12/2014 Dialysis resumed.  . Coronary artery disease, occlusive     a. 2001 MI s/p 4v CABG (LIMA-LAD, SVG-D2, SVG-OM1, SVG-right PDA, SVG-right PL1); b. cath 07/30/2014 vein grafts all down, patent LIMA to LAD, significant LM and ost LCx dx; c. 08/07/2014 PCI Synergy DES 2.5 mm x 18 mm to dist LM and ost LCx, rec lifelong DAPT.  [name] Chronic combined systolic and diastolic CHF, NYHA class 2 ([place])     a. 08/2013 Echo: EF 40-45%, impaired relaxation;  b. 07/2014 Echo: EF 25-30%;  c. 12/2014 Echo: EF 30-35%, diff HK, mild MR.  . Gallstone pancreatitis 2015   Past Surgical History  Procedure Laterality Date  . Cardiac surgery    . Shoulder arthroscopy    . Penile prosthesis implant    . Myocardial infarction    . Cardiac catheterization    . Coronary angioplasty    . Coronary artery bypass graft  2001  . Colonoscopy    . Liver biopsy    . Av fistula placement Left 2015  . Cardiac catheterization N/A 07/30/2014    Procedure: Left Heart Cath  and Cors/Grafts Angiography;  Surgeon: Dionisio David, MD;  Location: [facility];  Service: Cardiovascular;  Laterality: N/A;  . Cardiac catheterization N/A 08/07/2014    Procedure: Coronary Stent Intervention;  Surgeon: Lorretta Harp, MD;  Location: [facility];  Service: Cardiovascular;  Laterality: N/A;    Allergies  Allergies  Allergen Reactions  . Sulfa Antibiotics Itching    hives  . Morphine And Related     Makes patient feel weird    History of Present Illness    70 year old male with the above complex past medical history. He has a history of coronary artery disease dating back to 2001, at which time he suffered a myocardial infarction and subsequently underwent coronary artery bypass grafting. More recently, he underwent catheterization in June 2016 revealing severe left main and circumflex disease with a patent LIMA to the LAD. All of his vein grafts were occluded. He subsequently underwent successful PCI and stenting to the left main and left circumflex in July 2016. He also has a history of an ischemic cardiomyopathy and congestive heart failure. Most recent EF was 30-35% by echocardiogram in November 2016. He was also hospitalized in November 2016 with pneumonia and anemia requiring blood transfusion. At that time, he was also noted to have  worsening of his renal failure and dialysis was resumed. Since then, he has remained on dialysis. He says his nephrologist has recommended a 3 day a week schedule however he is only been doing 2 days a week. On the third day, he typically will take a when necessary Lasix and overall his volume has been well controlled.  He has been doing reasonably well from a cardiac standpoint. He has not been having any dyspnea. He resumed playing pickle ball for 2 hours, 4 days a week and is able to do this without any chest pain or dyspnea. He reports that last Friday evening, he had a prolonged episode of left sided/axillary chest  discomfort without associated symptoms, lasting about 2-1/2 hours and resolving spontaneously. Pain did not seem to go away after nitroglycerin. He does not think this was representative of an anginal equivalent. Since then, he has had no recurrence despite being very active. He denies PND, orthopnea, dizziness, 60, edema, or early satiety.  Home Medications    Prior to Admission medications   Medication Sig Start Date End Date Taking? Authorizing Provider  ALPRAZolam Duanne Moron) 0.5 MG tablet Take 0.5 mg by mouth as needed for anxiety.   Yes Historical Provider, MD  aspirin EC 81 MG tablet Take 81 mg by mouth daily.   Yes Historical Provider, MD  carvedilol (COREG) 3.125 MG tablet Take 1 tablet (3.125 mg total) by mouth 2 (two) times daily with a meal. 01/04/15  Yes Nita Sells, MD  ferrous sulfate 325 (65 FE) MG EC tablet Take 325 mg by mouth daily with breakfast.    Yes Historical Provider, MD  insulin glargine (LANTUS) 100 UNIT/ML injection Inject 0.1 mLs (10 Units total) into the skin at bedtime. 01/04/15  Yes Nita Sells, MD  isosorbide mononitrate (IMDUR) 60 MG 24 hr tablet Take 1.5 tablets (90 mg total) by mouth daily. 12/09/14  Yes Brett Canales, PA-C  nitroGLYCERIN (NITROLINGUAL) 0.4 MG/SPRAY spray Place 1 spray under the tongue every 5 (five) minutes x 3 doses as needed for chest pain. Do not use together with sublingual nitro 08/08/14  Yes Almyra Deforest, PA  oxyCODONE-acetaminophen (PERCOCET/ROXICET) 5-325 MG tablet Take 1 tablet by mouth every 6 (six) hours as needed for severe pain.   Yes Historical Provider, MD  pregabalin (LYRICA) 75 MG capsule Take 1 capsule (75 mg total) by mouth daily. 01/04/15  Yes Nita Sells, MD  ranolazine (RANEXA) 500 MG 12 hr tablet Take 1 tablet (500 mg total) by mouth 2 (two) times daily. 02/25/15  Yes Rogelia Mire, NP  rosuvastatin (CRESTOR) 20 MG tablet Take 1 tablet (20 mg total) by mouth at bedtime. 02/25/15  Yes Rogelia Mire, NP   ticagrelor (BRILINTA) 90 MG TABS tablet Take 1 tablet (90 mg total) by mouth 2 (two) times daily. 12/11/14  Yes Brett Canales, PA-C  furosemide (LASIX) 40 MG tablet Take 1 tablet (40 mg total) by mouth daily as needed for fluid. 02/25/15   Rogelia Mire, NP    Review of Systems    As above, he did have a prolonged episode of left-sided and axillary chest pain about 1 week ago. This resolved after 2-1/2 hours. He has had no recurrence of symptoms despite being fairly active.  All other systems reviewed and are otherwise negative except as noted above.  Physical Exam    VS:  BP 118/58 mmHg  Pulse 70  Ht 5\' 11"  (1.803 m)  Wt 201 lb 4 oz (91.286 kg)  BMI 28.08 kg/m2 , BMI Body mass index is 28.08 kg/(m^2). GEN: Well nourished, well developed, in no acute distress. HEENT: normal. Neck: Supple, no JVD, carotid bruits, or masses. Cardiac: RRR, 2/6 systolic ejection murmur at the right upper sternal border. His left forearm AV fistula is audible at the left upper sternal border and gives off the sound of a diastolic murmur. no rubs, or gallops. No clubbing, cyanosis, edema.  He has chronic venous stasis skin changes of his bilateral lower extremities. Radials/DP/PT 2+ and equal bilaterally. Left forearm AV fistula with positive bruit and thrill. Respiratory:  Respirations regular and unlabored, clear to auscultation bilaterally. GI: Soft, nontender, nondistended, BS + x 4. MS: no deformity or atrophy. Skin: warm and dry, no rash. Neuro:  Strength and sensation are intact. Psych: Normal affect.  Accessory Clinical Findings    ECG - regular sinus rhythm, 70, inferior infarct, LVH with repolarization abnormalities and anterolateral T-wave inversion. Anterolateral T-wave inversion is more pronounced than on prior ECGs but is also noted on September 29 EKG.  Assessment & Plan    1.  Coronary artery disease: Status post remote coronary artery bypass grafting with known occluded vein grafts  and patent LIMA to the LAD by catheterization in June and July 2016. He is also status post left main and left circumflex stenting. He did have an episode of chest discomfort lasting about 2-1/2 hours one week ago. This was more axillary in origin and different from his prior angina. There were no associated symptoms. He has since been active, playing pickle ball 4 days a week, to hours at a time, without symptoms or limitations. Continue medical therapy including aspirin, beta blocker, nitrate, Ranexa, statin, and Brilinta.  2. Hypertensive heart disease: Blood pressure is stable on beta blocker, nitrate.  3. Hyperlipidemia: LDL was 53 and September with normal LFTs in November.  4. Ischemic cardiomyopathy/chronic systolic congestive heart failure: Most recent EF was 30-35% in November. He is on beta blocker and nitrate therapy. He was placing not on an ACE inhibitor/ARB/ARNI/spironolactone secondary to stage IV to 5 kidney disease. He is now on dialysis. His volume is managed well by dialysis. He has previously been evaluated by electrophysiology with recommendation for ongoing medical therapy and subsequent follow-up echo to reevaluate LV function. I will arrange follow-up with Dr. Ellyn Hack in approximately 3 months and we can repeat echo at that time. If EF remains below 35%, would refer back to electrophysiology.  5. End-stage renal disease: Now on dialysis. He has been advised to go 3 days weekly is only going to. I recommend that he follow the orders of his nephrologists.  6. Disposition: Follow up Dr. Ellyn Hack in 3 months or sooner if necessary. Plan to repeat echo at that time.  Murray Hodgkins, NP 02/25/2015, 2:12 PM

## 2015-02-27 ENCOUNTER — Emergency Department
Admission: EM | Admit: 2015-02-27 | Discharge: 2015-02-27 | Disposition: A | Discharge status: Home / Self Care | Payer: Medicare Other | Attending: Emergency Medicine | Admitting: Emergency Medicine

## 2015-02-27 ENCOUNTER — Encounter: Payer: Self-pay | Admitting: Emergency Medicine

## 2015-02-27 DIAGNOSIS — E1122 Type 2 diabetes mellitus with diabetic chronic kidney disease: Secondary | ICD-10-CM | POA: Diagnosis not present

## 2015-02-27 DIAGNOSIS — N186 End stage renal disease: Secondary | ICD-10-CM | POA: Insufficient documentation

## 2015-02-27 DIAGNOSIS — Z7982 Long term (current) use of aspirin: Secondary | ICD-10-CM | POA: Diagnosis not present

## 2015-02-27 DIAGNOSIS — M25562 Pain in left knee: Secondary | ICD-10-CM | POA: Diagnosis not present

## 2015-02-27 DIAGNOSIS — Z87891 Personal history of nicotine dependence: Secondary | ICD-10-CM | POA: Diagnosis not present

## 2015-02-27 DIAGNOSIS — Z79899 Other long term (current) drug therapy: Secondary | ICD-10-CM | POA: Insufficient documentation

## 2015-02-27 DIAGNOSIS — Z794 Long term (current) use of insulin: Secondary | ICD-10-CM | POA: Insufficient documentation

## 2015-02-27 DIAGNOSIS — E1165 Type 2 diabetes mellitus with hyperglycemia: Secondary | ICD-10-CM | POA: Diagnosis present

## 2015-02-27 DIAGNOSIS — I120 Hypertensive chronic kidney disease with stage 5 chronic kidney disease or end stage renal disease: Secondary | ICD-10-CM | POA: Insufficient documentation

## 2015-02-27 DIAGNOSIS — R739 Hyperglycemia, unspecified: Secondary | ICD-10-CM

## 2015-02-27 HISTORY — DX: End stage renal disease: N18.6

## 2015-02-27 LAB — BASIC METABOLIC PANEL
Anion gap: 13 (ref 5–15)
BUN: 64 mg/dL — ABNORMAL HIGH (ref 6–20)
CALCIUM: 8.3 mg/dL — AB (ref 8.9–10.3)
CHLORIDE: 93 mmol/L — AB (ref 101–111)
CO2: 27 mmol/L (ref 22–32)
CREATININE: 5.86 mg/dL — AB (ref 0.61–1.24)
GFR calc non Af Amer: 9 mL/min — ABNORMAL LOW (ref >60)
GFR, EST AFRICAN AMERICAN: 10 mL/min — AB (ref >60)
GLUCOSE: 439 mg/dL — AB (ref 65–99)
Potassium: 4.3 mmol/L (ref 3.5–5.1)
Sodium: 133 mmol/L — ABNORMAL LOW (ref 135–145)

## 2015-02-27 LAB — GLUCOSE, CAPILLARY
GLUCOSE-CAPILLARY: 290 mg/dL — AB (ref 65–99)
Glucose-Capillary: 422 mg/dL — ABNORMAL HIGH (ref 65–99)

## 2015-02-27 LAB — URINALYSIS COMPLETE WITH MICROSCOPIC (ARMC ONLY)
Bilirubin Urine: NEGATIVE
Hgb urine dipstick: NEGATIVE
KETONES UR: NEGATIVE mg/dL
Leukocytes, UA: NEGATIVE
Nitrite: NEGATIVE
Protein, ur: >500 mg/dL — AB
SPECIFIC GRAVITY, URINE: 1.009 (ref 1.005–1.030)
SQUAMOUS EPITHELIAL / LPF: NONE SEEN
pH: 6 (ref 5.0–8.0)

## 2015-02-27 LAB — CBC
HCT: 29 % — ABNORMAL LOW (ref 40.0–52.0)
HEMOGLOBIN: 9.8 g/dL — AB (ref 13.0–18.0)
MCH: 29.2 pg (ref 26.0–34.0)
MCHC: 33.8 g/dL (ref 32.0–36.0)
MCV: 86.3 fL (ref 80.0–100.0)
PLATELETS: 111 10*3/uL — AB (ref 150–440)
RBC: 3.36 MIL/uL — AB (ref 4.40–5.90)
RDW: 16.9 % — ABNORMAL HIGH (ref 11.5–14.5)
WBC: 6.3 10*3/uL (ref 3.8–10.6)

## 2015-02-27 MED ORDER — OXYCODONE-ACETAMINOPHEN 5-325 MG PO TABS
1.0000 | ORAL_TABLET | Freq: Once | ORAL | Status: AC
  Administered 2015-02-27: 1 via ORAL
  Filled 2015-02-27: qty 1

## 2015-02-27 NOTE — ED Provider Notes (Signed)
[facility] Emergency Department Provider Note  ____________________________________________  Time seen: Approximately 840 PM  I have reviewed the triage vital signs and the nursing notes.   HISTORY  Chief Complaint Hyperglycemia    HPI Joseph [name] is a 70 y.o. male with a history of diabetes and end-stage renal disease on dialysis was presented today with hyperglycemia. He said that he took his blood sugar this morning and it was initially 300 and then recheck again it was greater than 500. He says that he has eaten ice cream recently as well as some fruit juice and he thinks that the extra sugar may have boosted his glucose higher than normal. He, this morning, took an extra 10 units of Lantusbecause of his high blood sugar. The patient is supposed to be going to dialysis Tuesday Thursday and Saturday but over the past month has taken himself out of Saturday dialysis. He says he has discussed this with the dialysis center and they are aware. Denies any pain. Denies any recent illness.  He is also complaining of left knee pain that started as a mild pain yesterday and has progressed today. He says that he played "pickle ball" which is kind of like tennis. He denies any twisting or injury during the game but says that his knee started hurting immediately after playing. He said that yesterday he used a small amount of Biofreeze and then using on oxycodone which helped relieve the pain. He says the pain is increased when he extends the leg.   Past Medical History  Diagnosis Date  . Hypertensive heart disease   . Hypercholesterolemia   . Prostate cancer ([place])   . Hepatosplenomegaly 2015    fibrosis, no cirrhosis on 06/2013 liver biopsy  . Thrombocytopenia ([place]) 2015  . Depression with anxiety   . IDDM (insulin dependent diabetes mellitus) ([place])     INSULIN DEPENDENT  . GERD (gastroesophageal reflux disease)   . History of shingles   . Ischemic cardiomyopathy      a. echo 08/06/2014: EF 25-30%, multiple WMA, mod DD, PASP 49 mm Hg;  b. 12/2014 Echo: EF 30-35%, diff HK, mild MR.  . CKD (chronic kidney disease), stage IV ([place])     a. previously on dialysis 11/2013->04/2014;  b. 12/2014 Dialysis resumed.  . Coronary artery disease, occlusive     a. 2001 MI s/p 4v CABG (LIMA-LAD, SVG-D2, SVG-OM1, SVG-right PDA, SVG-right PL1); b. cath 07/30/2014 vein grafts all down, patent LIMA to LAD, significant LM and ost LCx dx; c. 08/07/2014 PCI Synergy DES 2.5 mm x 18 mm to dist LM and ost LCx, rec lifelong DAPT.  [name] Chronic combined systolic and diastolic CHF, NYHA class 2 ([place])     a. 08/2013 Echo: EF 40-45%, impaired relaxation;  b. 07/2014 Echo: EF 25-30%;  c. 12/2014 Echo: EF 30-35%, diff HK, mild MR.  . Gallstone pancreatitis 2015  . ESRD (end stage renal disease) [facility])     Patient Active Problem List   Diagnosis Date Noted  . Hypertensive heart disease   . Hypercholesterolemia   . Syncope 12/28/2014  . Orthostatic hypotension   . RLL pneumonia   . PNA (pneumonia) 12/23/2014  . Acute renal failure superimposed on stage 4 chronic kidney disease ([place]) 11/06/2014  . 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 Stable angina ([place]) 10/16/2014  . Essential hypertension 08/27/2014  . CAD s/p CABG 2001, prior POBA, PCI/DES July 2016 08/27/2014  . Ischemic cardiomyopathy 08/18/2014  . NSTEMI (non-ST elevated myocardial infarction) (  [place]) 08/05/2014  . Gall stones 12/09/2013  . Hepatic cirrhosis ([place]) 07/14/2013  . Heart failure ([place]) 07/14/2013  . Portal hypertension ([place]) 07/14/2013  . Hx of CABG 2001 04/02/2013  . Type 2 diabetes mellitus with renal manifestations ([place]) 04/02/2013  . Anemia 04/02/2013  . Thrombocytopenia ([place]) 04/02/2013  . Acute on chronic systolic CHF (congestive heart failure) ([place]) 04/02/2013  . Arteriosclerosis of coronary artery 04/02/2013  . Chronic kidney disease 04/02/2013  . Atherosclerosis of coronary artery 04/02/2013  . Type 2 diabetes mellitus  ([place]) 04/02/2013    Past Surgical History  Procedure Laterality Date  . Cardiac surgery    . Shoulder arthroscopy    . Penile prosthesis implant    . Myocardial infarction    . Cardiac catheterization    . Coronary angioplasty    . Coronary artery bypass graft  2001  . Colonoscopy    . Liver biopsy    . Av fistula placement Left 2015  . Cardiac catheterization N/A 07/30/2014    Procedure: Left Heart Cath and Cors/Grafts Angiography;  Surgeon: Dionisio David, MD;  Location: [facility];  Service: Cardiovascular;  Laterality: N/A;  . Cardiac catheterization N/A 08/07/2014    Procedure: Coronary Stent Intervention;  Surgeon: Lorretta Harp, MD;  Location: [place];  Service: Cardiovascular;  Laterality: N/A;    Current Outpatient Rx  Name  Route  Sig  Dispense  Refill  . ALPRAZolam (XANAX) 0.5 MG tablet   Oral   Take 0.5 mg by mouth as needed for anxiety.         [name] aspirin EC 81 MG tablet   Oral   Take 81 mg by mouth daily.         . carvedilol (COREG) 3.125 MG tablet   Oral   Take 1 tablet (3.125 mg total) by mouth 2 (two) times daily with a meal.         . ferrous sulfate 325 (65 FE) MG EC tablet   Oral   Take 325 mg by mouth daily with breakfast.          . furosemide (LASIX) 40 MG tablet   Oral   Take 1 tablet (40 mg total) by mouth daily as needed for fluid.   90 tablet   3   . insulin glargine (LANTUS) 100 UNIT/ML injection   Subcutaneous   Inject 0.1 mLs (10 Units total) into the skin at bedtime.   10 mL   11   . isosorbide mononitrate (IMDUR) 60 MG 24 hr tablet   Oral   Take 1.5 tablets (90 mg total) by mouth daily.   135 tablet   3   . nitroGLYCERIN (NITROLINGUAL) 0.4 MG/SPRAY spray   Sublingual   Place 1 spray under the tongue every 5 (five) minutes x 3 doses as needed for chest pain. Do not use together with sublingual nitro   12 g   5   . oxyCODONE-acetaminophen (PERCOCET/ROXICET) 5-325 MG tablet   Oral   Take 1  tablet by mouth every 6 (six) hours as needed for severe pain.         . pregabalin (LYRICA) 75 MG capsule   Oral   Take 1 capsule (75 mg total) by mouth daily.         . ranolazine (RANEXA) 500 MG 12 hr tablet   Oral   Take 1 tablet (500 mg total) by mouth 2 (two) times daily.   180 tablet  1   . rosuvastatin (CRESTOR) 20 MG tablet   Oral   Take 1 tablet (20 mg total) by mouth at bedtime.   90 tablet   1   . ticagrelor (BRILINTA) 90 MG TABS tablet   Oral   Take 1 tablet (90 mg total) by mouth 2 (two) times daily.   60 tablet   6     Allergies Sulfa antibiotics and Morphine and related  Family History  Problem Relation Age of Onset  . Acute myelogenous leukemia Brother   . CAD Brother   . Diabetes Mellitus II Brother   . Heart disease Father 28    CABG    Social History Social History  Substance Use Topics  . Smoking status: Former Smoker    Quit date: 02/07/2008  . Smokeless tobacco: Former Systems developer  . Alcohol Use: No    Review of Systems Constitutional: No fever/chills Eyes: No visual changes. ENT: No sore throat. Cardiovascular: Denies chest pain. Respiratory: Denies shortness of breath. Gastrointestinal: No abdominal pain.  No nausea, no vomiting.  No diarrhea.  No constipation. Genitourinary: Negative for dysuria. Musculoskeletal: Negative for back pain. Skin: Negative for rash. Neurological: Negative for headaches, focal weakness or numbness.  10-point ROS otherwise negative.  ____________________________________________   PHYSICAL EXAM:  VITAL SIGNS: ED Triage Vitals  Enc Vitals Group     BP 02/27/15 1550 161/72 mmHg     Pulse Rate 02/27/15 1550 63     Resp 02/27/15 1550 17     Temp 02/27/15 1550 97.9 F (36.6 C)     Temp Source 02/27/15 1550 Oral     SpO2 02/27/15 1550 99 %     Weight 02/27/15 1546 200 lb (90.719 kg)     Height 02/27/15 1546 5\' 10"  (1.778 m)     Head Cir --      Peak Flow --      Pain Score 02/27/15 1956 8      Pain Loc --      Pain Edu? --      Excl. in [place]? --     Constitutional: Alert and oriented. Well appearing and in no acute distress. Eyes: Conjunctivae are normal. PERRL. EOMI. Head: Atraumatic. Nose: No congestion/rhinnorhea. Mouth/Throat: Mucous membranes are moist.  Oropharynx non-erythematous. Neck: No stridor.   Cardiovascular: Normal rate, regular rhythm. Grossly normal heart sounds.  Good peripheral circulation. Palpable thrill to the dialysis access in the left forearm. Respiratory: Normal respiratory effort.  No retractions. Lungs CTAB. Gastrointestinal: Soft and nontender. No distention. No abdominal bruits. No CVA tenderness. Musculoskeletal: No lower extremity edema. Tenderness to palpation over the patellar ligaments. There is no effusion. The patient is able to range his knee with pain over the patellar ligaments. No ligamentous laxity.  No joint effusions. Neurologic:  Normal speech and language. No gross focal neurologic deficits are appreciated. No gait instability. Skin:  Skin is warm, dry and intact. No rash noted. Psychiatric: Mood and affect are normal. Speech and behavior are normal.  ____________________________________________   LABS (all labs ordered are listed, but only abnormal results are displayed)  Labs Reviewed  GLUCOSE, CAPILLARY - Abnormal; Notable for the following:    Glucose-Capillary 422 (*)    All other components within normal limits  BASIC METABOLIC PANEL - Abnormal; Notable for the following:    Sodium 133 (*)    Chloride 93 (*)    Glucose, Bld 439 (*)    BUN 64 (*)    Creatinine, Ser 5.86 (*)  Calcium 8.3 (*)    GFR calc non Af Amer 9 (*)    GFR calc Af Amer 10 (*)    All other components within normal limits  CBC - Abnormal; Notable for the following:    RBC 3.36 (*)    Hemoglobin 9.8 (*)    HCT 29.0 (*)    RDW 16.9 (*)    Platelets 111 (*)    All other components within normal limits  URINALYSIS COMPLETEWITH MICROSCOPIC ([facility]  ONLY) - Abnormal; Notable for the following:    Color, Urine YELLOW (*)    APPearance CLEAR (*)    Glucose, UA >500 (*)    Protein, ur >500 (*)    Bacteria, UA RARE (*)    All other components within normal limits  GLUCOSE, CAPILLARY - Abnormal; Notable for the following:    Glucose-Capillary 290 (*)    All other components within normal limits  CBG MONITORING, ED  CBG MONITORING, ED   ____________________________________________  EKG   ____________________________________________  RADIOLOGY   ____________________________________________   PROCEDURES  ____________________________________________   INITIAL IMPRESSION / ASSESSMENT AND PLAN / ED COURSE  Pertinent labs & imaging results that were available during my care of the patient were reviewed by me and considered in my medical decision making (see chart for details).  ----------------------------------------- 9:07 PM on 02/27/2015 -----------------------------------------  Patient with steadily decreasing glucose over the course of the day. Possibly elevated glucose secondary to increase in sugar intake. Advised patient only eat a small dinner that is low in carbohydrates and to forego his evening dose of insulin secondary to this sugar slowly decreasing throughout the day. I also advised him to start taking his glucose regularly in the mornings and in the evenings before eating. I offered to call his nephrologist to schedule dialysis and he would rather call first thing Monday morning to schedule and make up appointment. He says that he has been skipping dialysis on Saturdays over the past month as is.  Recommended continuation of icy hot, Aspercreme and Tylenol as well as rest and elevation for the left knee which I believe is a ligamentous strain. ____________________________________________   FINAL CLINICAL IMPRESSION(S) / ED DIAGNOSES  Upper glycemia. Patellar ligament strain.    Orbie Pyo,  MD 02/27/15 2112

## 2015-02-27 NOTE — ED Notes (Addendum)
Spoke with dr Clearnce Hasten about pt.  Holding off on fluid r/t pt is dialysis until seen by MD.  Will wait on further tx since pt took extra lantus at 7 am.  Pt instructed to return to triage if any concern of blood sugar increasing or decreasing.

## 2015-02-27 NOTE — ED Notes (Signed)
Pt reports blood sugar was 300 this morning and then he checked it again and it was over 500.  Pt denies any sx r/t blood sugar being elevated.

## 2015-02-27 NOTE — ED Notes (Signed)
Pt did not go to dialysis today.

## 2015-02-27 NOTE — ED Notes (Signed)
Pt took 20 units lantus last night and then too 10 units lantus this morning when blood sugar was 300

## 2015-02-27 NOTE — ED Notes (Signed)
fsbs 290.  Pt alert. Skin warm and dry.  No n/v/d.

## 2015-02-27 NOTE — Discharge Instructions (Signed)
Hyperglycemia °High blood sugar (hyperglycemia) means that the level of sugar in your blood is higher than it should be. Signs of high blood sugar include: °· Feeling thirsty. °· Frequent peeing (urinating). °· Feeling tired or sleepy. °· Dry mouth. °· Vision changes. °· Feeling weak. °· Feeling hungry but losing weight. °· Numbness and tingling in your hands or feet. °· Headache. °When you ignore these signs, your blood sugar may keep going up. These problems may get worse, and other problems may begin. °HOME CARE °· Check your blood sugars as told by your doctor. Write down the numbers with the date and time. °· Take the right amount of insulin or diabetes pills at the right time. Write down the dose with date and time. °· Refill your insulin or diabetes pills before running out. °· Watch what you eat. Follow your meal plan. °· Drink liquids without sugar, such as water. Check with your doctor if you have kidney or heart disease. °· Follow your doctor's orders for exercise. Exercise at the same time of day. °· Keep your doctor's appointments. °GET HELP RIGHT AWAY IF:  °· You have trouble thinking or are confused. °· You have fast breathing with fruity smelling breath. °· You pass out (faint). °· You have 2 to 3 days of high blood sugars and you do not know why. °· You have chest pain. °· You are feeling sick to your stomach (nauseous) or throwing up (vomiting). °· You have sudden vision changes. °MAKE SURE YOU:  °· Understand these instructions. °· Will watch your condition. °· Will get help right away if you are not doing well or get worse. °  °This information is not intended to replace advice given to you by your health care provider. Make sure you discuss any questions you have with your health care provider. °  °Document Released: 11/20/2008 Document Revised: 02/13/2014 Document Reviewed: 09/29/2014 °Elsevier Interactive Patient Education ©2016 [employer] ° °

## 2015-03-01 ENCOUNTER — Telehealth: Payer: Self-pay

## 2015-03-01 NOTE — Telephone Encounter (Signed)
Prior auth for Brilinta not needed with [employer].

## 2015-03-08 ENCOUNTER — Telehealth: Payer: Self-pay

## 2015-03-08 NOTE — Telephone Encounter (Signed)
Request for Tier reduction of Brilinta 90mg  sent to [facility].

## 2015-03-09 ENCOUNTER — Encounter (HOSPITAL_COMMUNITY)
Admission: EM | Disposition: A | Discharge status: Home / Self Care | Payer: Medicare Other | Source: Home / Self Care | Attending: Cardiovascular Disease

## 2015-03-09 ENCOUNTER — Encounter (HOSPITAL_COMMUNITY): Payer: Self-pay | Admitting: Emergency Medicine

## 2015-03-09 ENCOUNTER — Inpatient Hospital Stay (HOSPITAL_COMMUNITY)
Admission: EM | Admit: 2015-03-09 | Discharge: 2015-03-11 | DRG: 280 | Disposition: A | Discharge status: Home / Self Care | Payer: Medicare Other | Attending: Cardiovascular Disease | Admitting: Cardiovascular Disease

## 2015-03-09 DIAGNOSIS — Z9861 Coronary angioplasty status: Secondary | ICD-10-CM

## 2015-03-09 DIAGNOSIS — I10 Essential (primary) hypertension: Secondary | ICD-10-CM | POA: Diagnosis present

## 2015-03-09 DIAGNOSIS — Z87891 Personal history of nicotine dependence: Secondary | ICD-10-CM

## 2015-03-09 DIAGNOSIS — E1122 Type 2 diabetes mellitus with diabetic chronic kidney disease: Secondary | ICD-10-CM | POA: Diagnosis present

## 2015-03-09 DIAGNOSIS — Y831 Surgical operation with implant of artificial internal device as the cause of abnormal reaction of the patient, or of later complication, without mention of misadventure at the time of the procedure: Secondary | ICD-10-CM | POA: Diagnosis present

## 2015-03-09 DIAGNOSIS — I2129 ST elevation (STEMI) myocardial infarction involving other sites: Secondary | ICD-10-CM

## 2015-03-09 DIAGNOSIS — I2109 ST elevation (STEMI) myocardial infarction involving other coronary artery of anterior wall: Secondary | ICD-10-CM | POA: Diagnosis not present

## 2015-03-09 DIAGNOSIS — E1142 Type 2 diabetes mellitus with diabetic polyneuropathy: Secondary | ICD-10-CM | POA: Diagnosis present

## 2015-03-09 DIAGNOSIS — Z992 Dependence on renal dialysis: Secondary | ICD-10-CM

## 2015-03-09 DIAGNOSIS — I25118 Atherosclerotic heart disease of native coronary artery with other forms of angina pectoris: Secondary | ICD-10-CM | POA: Diagnosis present

## 2015-03-09 DIAGNOSIS — N186 End stage renal disease: Secondary | ICD-10-CM | POA: Insufficient documentation

## 2015-03-09 DIAGNOSIS — I110 Hypertensive heart disease with heart failure: Secondary | ICD-10-CM | POA: Insufficient documentation

## 2015-03-09 DIAGNOSIS — E785 Hyperlipidemia, unspecified: Secondary | ICD-10-CM | POA: Diagnosis present

## 2015-03-09 DIAGNOSIS — I2119 ST elevation (STEMI) myocardial infarction involving other coronary artery of inferior wall: Secondary | ICD-10-CM

## 2015-03-09 DIAGNOSIS — Z8546 Personal history of malignant neoplasm of prostate: Secondary | ICD-10-CM

## 2015-03-09 DIAGNOSIS — R079 Chest pain, unspecified: Secondary | ICD-10-CM | POA: Diagnosis not present

## 2015-03-09 DIAGNOSIS — I2510 Atherosclerotic heart disease of native coronary artery without angina pectoris: Secondary | ICD-10-CM | POA: Diagnosis not present

## 2015-03-09 DIAGNOSIS — I5042 Chronic combined systolic (congestive) and diastolic (congestive) heart failure: Secondary | ICD-10-CM | POA: Diagnosis not present

## 2015-03-09 DIAGNOSIS — I214 Non-ST elevation (NSTEMI) myocardial infarction: Secondary | ICD-10-CM

## 2015-03-09 DIAGNOSIS — F418 Other specified anxiety disorders: Secondary | ICD-10-CM | POA: Diagnosis present

## 2015-03-09 DIAGNOSIS — Z7982 Long term (current) use of aspirin: Secondary | ICD-10-CM

## 2015-03-09 DIAGNOSIS — I208 Other forms of angina pectoris: Secondary | ICD-10-CM | POA: Diagnosis present

## 2015-03-09 DIAGNOSIS — Z794 Long term (current) use of insulin: Secondary | ICD-10-CM

## 2015-03-09 DIAGNOSIS — I160 Hypertensive urgency: Secondary | ICD-10-CM | POA: Diagnosis present

## 2015-03-09 DIAGNOSIS — Z951 Presence of aortocoronary bypass graft: Secondary | ICD-10-CM

## 2015-03-09 DIAGNOSIS — E1129 Type 2 diabetes mellitus with other diabetic kidney complication: Secondary | ICD-10-CM | POA: Diagnosis present

## 2015-03-09 DIAGNOSIS — I252 Old myocardial infarction: Secondary | ICD-10-CM

## 2015-03-09 DIAGNOSIS — I132 Hypertensive heart and chronic kidney disease with heart failure and with stage 5 chronic kidney disease, or end stage renal disease: Secondary | ICD-10-CM | POA: Diagnosis present

## 2015-03-09 DIAGNOSIS — I161 Hypertensive emergency: Secondary | ICD-10-CM | POA: Diagnosis present

## 2015-03-09 DIAGNOSIS — T82855A Stenosis of coronary artery stent, initial encounter: Secondary | ICD-10-CM | POA: Diagnosis present

## 2015-03-09 DIAGNOSIS — I255 Ischemic cardiomyopathy: Secondary | ICD-10-CM | POA: Diagnosis present

## 2015-03-09 DIAGNOSIS — Z9114 Patient's other noncompliance with medication regimen: Secondary | ICD-10-CM

## 2015-03-09 HISTORY — PX: CARDIAC CATHETERIZATION: SHX172

## 2015-03-09 LAB — DIFFERENTIAL
BASOS ABS: 0 10*3/uL (ref 0.0–0.1)
Basophils Relative: 0 %
Eosinophils Absolute: 0 10*3/uL (ref 0.0–0.7)
Eosinophils Relative: 1 %
LYMPHS PCT: 12 %
Lymphs Abs: 0.6 10*3/uL — ABNORMAL LOW (ref 0.7–4.0)
MONO ABS: 0.5 10*3/uL (ref 0.1–1.0)
MONOS PCT: 10 %
NEUTROS ABS: 4 10*3/uL (ref 1.7–7.7)
Neutrophils Relative %: 77 %

## 2015-03-09 LAB — I-STAT TROPONIN, ED: Troponin i, poc: 0.06 ng/mL (ref 0.00–0.08)

## 2015-03-09 LAB — PROTIME-INR
INR: 1.15 (ref 0.00–1.49)
Prothrombin Time: 14.8 seconds (ref 11.6–15.2)

## 2015-03-09 LAB — I-STAT CHEM 8, ED
BUN: 41 mg/dL — AB (ref 6–20)
CALCIUM ION: 0.9 mmol/L — AB (ref 1.13–1.30)
CHLORIDE: 100 mmol/L — AB (ref 101–111)
Creatinine, Ser: 5.1 mg/dL — ABNORMAL HIGH (ref 0.61–1.24)
Glucose, Bld: 501 mg/dL — ABNORMAL HIGH (ref 65–99)
HEMATOCRIT: 27 % — AB (ref 39.0–52.0)
Hemoglobin: 9.2 g/dL — ABNORMAL LOW (ref 13.0–17.0)
Potassium: 4.3 mmol/L (ref 3.5–5.1)
SODIUM: 138 mmol/L (ref 135–145)
TCO2: 22 mmol/L (ref 0–100)

## 2015-03-09 LAB — APTT: APTT: 31 s (ref 24–37)

## 2015-03-09 SURGERY — LEFT HEART CATH AND CORONARY ANGIOGRAPHY
Anesthesia: LOCAL

## 2015-03-09 MED ORDER — FENTANYL CITRATE (PF) 100 MCG/2ML IJ SOLN
INTRAMUSCULAR | Status: AC
  Filled 2015-03-09: qty 2

## 2015-03-09 MED ORDER — MIDAZOLAM HCL 2 MG/2ML IJ SOLN
INTRAMUSCULAR | Status: AC
  Filled 2015-03-09: qty 2

## 2015-03-09 MED ORDER — HEPARIN SODIUM (PORCINE) 5000 UNIT/ML IJ SOLN
INTRAMUSCULAR | Status: AC
  Filled 2015-03-09: qty 1

## 2015-03-09 MED ORDER — VERAPAMIL HCL 2.5 MG/ML IV SOLN
INTRAVENOUS | Status: AC
  Filled 2015-03-09: qty 2

## 2015-03-09 MED ORDER — NITROGLYCERIN 0.4 MG SL SUBL
SUBLINGUAL_TABLET | SUBLINGUAL | Status: AC
  Administered 2015-03-09: 0.4 mg
  Filled 2015-03-09: qty 1

## 2015-03-09 MED ORDER — NITROGLYCERIN 1 MG/10 ML FOR IR/CATH LAB
INTRA_ARTERIAL | Status: AC
  Filled 2015-03-09: qty 10

## 2015-03-09 MED ORDER — HEPARIN SODIUM (PORCINE) 1000 UNIT/ML IJ SOLN
INTRAMUSCULAR | Status: DC | PRN
  Administered 2015-03-09: 5000 [IU] via INTRAVENOUS

## 2015-03-09 MED ORDER — NITROGLYCERIN IN D5W 200-5 MCG/ML-% IV SOLN
INTRAVENOUS | Status: DC | PRN
  Administered 2015-03-09: 10 ug/min via INTRAVENOUS
  Administered 2015-03-09

## 2015-03-09 MED ORDER — HEPARIN SODIUM (PORCINE) 1000 UNIT/ML IJ SOLN
INTRAMUSCULAR | Status: AC
  Filled 2015-03-09: qty 1

## 2015-03-09 MED ORDER — FENTANYL CITRATE (PF) 100 MCG/2ML IJ SOLN
INTRAMUSCULAR | Status: DC | PRN
  Administered 2015-03-09: 25 ug via INTRAVENOUS
  Administered 2015-03-09: 50 ug via INTRAVENOUS

## 2015-03-09 MED ORDER — LIDOCAINE HCL (PF) 1 % IJ SOLN
INTRAMUSCULAR | Status: AC
  Filled 2015-03-09: qty 30

## 2015-03-09 MED ORDER — HEPARIN (PORCINE) IN NACL 2-0.9 UNIT/ML-% IJ SOLN
INTRAMUSCULAR | Status: AC
  Filled 2015-03-09: qty 1000

## 2015-03-09 MED ORDER — MIDAZOLAM HCL 2 MG/2ML IJ SOLN
INTRAMUSCULAR | Status: DC | PRN
  Administered 2015-03-09 – 2015-03-10 (×2): 1 mg via INTRAVENOUS

## 2015-03-09 MED ORDER — HEPARIN SODIUM (PORCINE) 5000 UNIT/ML IJ SOLN
4000.0000 [IU] | Freq: Once | INTRAMUSCULAR | Status: AC
  Administered 2015-03-09: 4000 [IU] via INTRAVENOUS

## 2015-03-09 MED ORDER — METOPROLOL TARTRATE 1 MG/ML IV SOLN
INTRAVENOUS | Status: AC
  Filled 2015-03-09: qty 5

## 2015-03-09 SURGICAL SUPPLY — 12 items
CATH INFINITI 5 FR IM (CATHETERS) ×2 IMPLANT
CATH INFINITI 5FR MULTPACK ANG (CATHETERS) ×2 IMPLANT
CATH VISTA GUIDE 6FR XB3.5 (CATHETERS) ×2 IMPLANT
GLIDESHEATH SLEND SS 6F .021 (SHEATH) IMPLANT
KIT HEART LEFT (KITS) ×2 IMPLANT
PACK CARDIAC CATHETERIZATION (CUSTOM PROCEDURE TRAY) ×2 IMPLANT
SHEATH PINNACLE 6F 10CM (SHEATH) ×2 IMPLANT
SYR MEDRAD MARK V 150ML (SYRINGE) ×2 IMPLANT
TRANSDUCER W/STOPCOCK (MISCELLANEOUS) ×2 IMPLANT
TUBING CIL FLEX 10 FLL-RA (TUBING) ×2 IMPLANT
WIRE EMERALD 3MM-J .035X150CM (WIRE) ×2 IMPLANT
WIRE SAFE-T 1.5MM-J .035X260CM (WIRE) IMPLANT

## 2015-03-09 NOTE — ED Provider Notes (Signed)
CSN: [id number]     Arrival date & time 03/09/15  2315 History  By signing my name below, I, Joseph [name], attest that this documentation has been prepared under the direction and in the presence of No att. providers found. Electronically Signed: Altamease [name], ED Scribe. 03/09/2015. 11:48 PM    Chief Complaint  Patient presents with  . Code STEMI   The history is provided by the patient. No language interpreter was used.     Brought in by EMS, Joseph [name] is a 70 y.o. male with PMHx of MI X2 s/p CABG and coronary stent placement, IDDM,hypercholesteremia, CHF, and ESRD on T/TH hemodialysis  who presents to the Emergency Department complaining of constant, left-sided chest pain with onset near 9 PM tonight. He describes the pain as similar to previous heart attacks. The pain was initially 8/10 in severity but is now 3/10 after 1 inch NTG paste and 324 mg of aspirin.  Pt denies SOB and sweating. His last stent placement was in July 2016. He sees Dr. Wilhemina Cash for cardiology.   Level V caveat for acuity of condition.  Patient presents as a prehospital code STEMI.  Past Medical History  Diagnosis Date  . Hypertensive heart disease   . Hypercholesterolemia   . Prostate cancer ([place])   . Hepatosplenomegaly 2015    fibrosis, no cirrhosis on 06/2013 liver biopsy  . Thrombocytopenia ([place]) 2015  . Depression with anxiety   . IDDM (insulin dependent diabetes mellitus) ([place])     INSULIN DEPENDENT  . GERD (gastroesophageal reflux disease)   . History of shingles   . Ischemic cardiomyopathy     a. echo 08/06/2014: EF 25-30%, multiple WMA, mod DD, PASP 49 mm Hg;  b. 12/2014 Echo: EF 30-35%, diff HK, mild MR.  . CKD (chronic kidney disease), stage IV ([place])     a. previously on dialysis 11/2013->04/2014;  b. 12/2014 Dialysis resumed.  . Coronary artery disease, occlusive     a. 2001 MI s/p 4v CABG (LIMA-LAD, SVG-D2, SVG-OM1, SVG-right PDA, SVG-right PL1); b. cath 07/30/2014 vein grafts all down,  patent LIMA to LAD, significant LM and ost LCx dx; c. 08/07/2014 PCI Synergy DES 2.5 mm x 18 mm to dist LM and ost LCx, rec lifelong DAPT.  [name] Chronic combined systolic and diastolic CHF, NYHA class 2 ([place])     a. 08/2013 Echo: EF 40-45%, impaired relaxation;  b. 07/2014 Echo: EF 25-30%;  c. 12/2014 Echo: EF 30-35%, diff HK, mild MR.  . Gallstone pancreatitis 2015  . ESRD (end stage renal disease) [facility])    Past Surgical History  Procedure Laterality Date  . Cardiac surgery    . Shoulder arthroscopy    . Penile prosthesis implant    . Myocardial infarction    . Cardiac catheterization    . Coronary angioplasty    . Coronary artery bypass graft  2001  . Colonoscopy    . Liver biopsy    . Av fistula placement Left 2015  . Cardiac catheterization N/A 07/30/2014    Procedure: Left Heart Cath and Cors/Grafts Angiography;  Surgeon: Dionisio David, MD;  Location: [facility];  Service: Cardiovascular;  Laterality: N/A;  . Cardiac catheterization N/A 08/07/2014    Procedure: Coronary Stent Intervention;  Surgeon: Lorretta Harp, MD;  Location: [place] [facility];  Service: Cardiovascular;  Laterality: N/A;   Family History  Problem Relation Age of Onset  . Acute myelogenous leukemia Brother   . CAD Brother   .  Diabetes Mellitus II Brother   . Heart disease Father 19    CABG   Social History  Substance Use Topics  . Smoking status: Former Smoker    Quit date: 02/07/2008  . Smokeless tobacco: Former Systems developer  . Alcohol Use: No    Review of Systems  Unable to perform ROS: Acuity of condition  Constitutional: Negative for diaphoresis.  Respiratory: Negative for shortness of breath.   Cardiovascular: Positive for chest pain.  All other systems reviewed and are negative.  Review of systems Limited secondary to acuity of condition.  Allergies  Sulfa antibiotics and Morphine and related  Home Medications   Prior to Admission medications   Medication Sig Start Date End Date Taking?  Authorizing Provider  ALPRAZolam Duanne Moron) 0.5 MG tablet Take 0.5 mg by mouth as needed for anxiety.    Historical Provider, MD  aspirin EC 81 MG tablet Take 81 mg by mouth daily.    Historical Provider, MD  carvedilol (COREG) 3.125 MG tablet Take 1 tablet (3.125 mg total) by mouth 2 (two) times daily with a meal. 01/04/15   Nita Sells, MD  ferrous sulfate 325 (65 FE) MG EC tablet Take 325 mg by mouth daily with breakfast.     Historical Provider, MD  furosemide (LASIX) 40 MG tablet Take 1 tablet (40 mg total) by mouth daily as needed for fluid. 02/25/15   Rogelia Mire, NP  insulin glargine (LANTUS) 100 UNIT/ML injection Inject 0.1 mLs (10 Units total) into the skin at bedtime. 01/04/15   Nita Sells, MD  isosorbide mononitrate (IMDUR) 60 MG 24 hr tablet Take 1.5 tablets (90 mg total) by mouth daily. 12/09/14   Brett Canales, PA-C  nitroGLYCERIN (NITROLINGUAL) 0.4 MG/SPRAY spray Place 1 spray under the tongue every 5 (five) minutes x 3 doses as needed for chest pain. Do not use together with sublingual nitro 08/08/14   Almyra Deforest, PA  oxyCODONE-acetaminophen (PERCOCET/ROXICET) 5-325 MG tablet Take 1 tablet by mouth every 6 (six) hours as needed for severe pain.    Historical Provider, MD  pregabalin (LYRICA) 75 MG capsule Take 1 capsule (75 mg total) by mouth daily. 01/04/15   Nita Sells, MD  ranolazine (RANEXA) 500 MG 12 hr tablet Take 1 tablet (500 mg total) by mouth 2 (two) times daily. 02/25/15   Rogelia Mire, NP  rosuvastatin (CRESTOR) 20 MG tablet Take 1 tablet (20 mg total) by mouth at bedtime. 02/25/15   Rogelia Mire, NP  ticagrelor (BRILINTA) 90 MG TABS tablet Take 1 tablet (90 mg total) by mouth 2 (two) times daily. 12/11/14   Brett Canales, PA-C   BP 182/93 mmHg  Pulse 90  Temp(Src) 98.1 F (36.7 C) (Oral)  Resp 22  Ht 5\' 10"  (1.778 m)  Wt 200 lb (90.719 kg)  BMI 28.70 kg/m2  SpO2 98% Physical Exam  Constitutional: He is oriented to person,  place, and time. He appears well-developed and well-nourished. No distress.  HENT:  Head: Normocephalic and atraumatic.  Neck: Neck supple.  Cardiovascular: Normal rate, regular rhythm and normal heart sounds.   No murmur heard. Fistula left upper extremity  Pulmonary/Chest: Effort normal and breath sounds normal. No respiratory distress. He has no wheezes.  Musculoskeletal: He exhibits edema.  Trace bilateral lower extremity edema.  Neurological: He is alert and oriented to person, place, and time.  Skin: Skin is warm and dry.  Psychiatric: He has a normal mood and affect.  Nursing note and vitals reviewed.  ED Course  Procedures (including critical care time)  CRITICAL CARE Performed by: Merryl Hacker   Total critical care time: 20 minutes  Critical care time was exclusive of separately billable procedures and treating other patients.  Critical care was necessary to treat or prevent imminent or life-threatening deterioration.  Critical care was time spent personally by me on the following activities: development of treatment plan with patient and/or surrogate as well as nursing, discussions with consultants, evaluation of patient's response to treatment, examination of patient, obtaining history from patient or surrogate, ordering and performing treatments and interventions, ordering and review of laboratory studies, ordering and review of radiographic studies, pulse oximetry and re-evaluation of patient's condition.  DIAGNOSTIC STUDIES: Oxygen Saturation is 98% on RA,  normal by my interpretation.    COORDINATION OF CARE: 11:24 PM Discussed treatment plan which includes lab work, EKG, Heparin, and sublingual NTG with pt at bedside and pt agreed to plan. Cardiology is at the bedside.    Labs Review Labs Reviewed  I-STAT CHEM 8, ED - Abnormal; Notable for the following:    Chloride 100 (*)    BUN 41 (*)    Creatinine, Ser 5.10 (*)    Glucose, Bld 501 (*)     Calcium, Ion 0.90 (*)    Hemoglobin 9.2 (*)    HCT 27.0 (*)    All other components within normal limits  CBC  DIFFERENTIAL  PROTIME-INR  APTT  BASIC METABOLIC PANEL  I-STAT TROPOININ, ED    Imaging Review No results found. I have personally reviewed and evaluated these lab results as part of my medical decision-making.   EKG Interpretation   Date/Time:  Tuesday March 09 2015 23:20:23 EST Ventricular Rate:  90 PR Interval:  218 QRS Duration: 140 QT Interval:  377 QTC Calculation: 461 R Axis:   65 Text Interpretation:  Sinus rhythm Borderline prolonged PR interval  Nonspecific intraventricular conduction delay Inferior infarct, acute  (RCA) Probable RV involvement, suggest recording right precordial leads  Baseline wander in lead(s) I II aVR ** ** ACUTE MI / STEMI ** ** Confirmed  by Ann Bohne  MD, [place] ([id number]) on 03/09/2015 11:48:50 PM      MDM   Final diagnoses:  ST elevation myocardial infarction (STEMI) involving other coronary artery [facility])    Patient presents as a prehospital STEMI. He is nontoxic-appearing. Currently his pain is 3 out of 10. Confirmed EKG with ST elevation in the inferior leads and reciprocal changes. Patient was given 4000 units of heparin and supplemental nitroglycerin. Dr. Burt Knack is at the bedside and the Cath Lab team is ready for the patient upstairs.  I personally performed the services described in this documentation, which was scribed in my presence. The recorded information has been reviewed and is accurate.      Merryl Hacker, MD 03/09/15 [phone]

## 2015-03-09 NOTE — H&P (Signed)
CARDIOLOGY INPATIENT HISTORY AND PHYSICAL EXAMINATION NOTE  Patient ID: Joseph [name] MRN: [id number], DOB/AGE: August 11, 1945   Admit date: 03/09/2015   Primary Physician: Marden Noble, MD Primary Cardiologist: Dr. Ellyn Hack   Reason for admission: chest pain  HPI: This is a 70 y.o.white male with known history of MI s/p CABG 2001 (LIMA to LAD patent and SVG x 3 all occluded), ESRD on HD tues, thur, GERD, depression, HLD, HTN, ischemic cardiomyopathy (LVEF 25%) who presented with chest pain today. Chest pain started at 9 pm today when he was at home. He took NTG. This chest painw as similar to his prior MI. The chest pain was substernal in location, did not radiate, 10/10 in intensity, did not change with position, deep breathing or movement. Patient had some SOB associated with it. Patient also had very high blood pressure and elevated glucose.  EMS was called and patient was found to have anterior STEMI on the EKG. Patient recently saw NP at Dr. Allison Quarry clinic on 02/25/2015. He had a cardiac cath 6 months ago when LM was stented.   Cardiac cath 08/07/2014: LIMA to LAD patent, 3 x SVG occluded, RCA occluded  PCI Synergy DES 2.5 mm x 18 mm to dist LM and ost LCx, rec lifelong DAPT.  Problem List: Past Medical History  Diagnosis Date  . Hypertensive heart disease   . Hypercholesterolemia   . Prostate cancer ([place])   . Hepatosplenomegaly 2015    fibrosis, no cirrhosis on 06/2013 liver biopsy  . Thrombocytopenia ([place]) 2015  . Depression with anxiety   . IDDM (insulin dependent diabetes mellitus) ([place])     INSULIN DEPENDENT  . GERD (gastroesophageal reflux disease)   . History of shingles   . Ischemic cardiomyopathy     a. echo 08/06/2014: EF 25-30%, multiple WMA, mod DD, PASP 49 mm Hg;  b. 12/2014 Echo: EF 30-35%, diff HK, mild MR.  . CKD (chronic kidney disease), stage IV ([place])     a. previously on dialysis 11/2013->04/2014;  b. 12/2014 Dialysis resumed.  . Coronary artery disease,  occlusive     a. 2001 MI s/p 4v CABG (LIMA-LAD, SVG-D2, SVG-OM1, SVG-right PDA, SVG-right PL1); b. cath 07/30/2014 vein grafts all down, patent LIMA to LAD, significant LM and ost LCx dx; c. 08/07/2014 PCI Synergy DES 2.5 mm x 18 mm to dist LM and ost LCx, rec lifelong DAPT.  [name] Chronic combined systolic and diastolic CHF, NYHA class 2 ([place])     a. 08/2013 Echo: EF 40-45%, impaired relaxation;  b. 07/2014 Echo: EF 25-30%;  c. 12/2014 Echo: EF 30-35%, diff HK, mild MR.  . Gallstone pancreatitis 2015  . ESRD (end stage renal disease) [facility])     Past Surgical History  Procedure Laterality Date  . Cardiac surgery    . Shoulder arthroscopy    . Penile prosthesis implant    . Myocardial infarction    . Cardiac catheterization    . Coronary angioplasty    . Coronary artery bypass graft  2001  . Colonoscopy    . Liver biopsy    . Av fistula placement Left 2015  . Cardiac catheterization N/A 07/30/2014    Procedure: Left Heart Cath and Cors/Grafts Angiography;  Surgeon: Dionisio David, MD;  Location: [facility];  Service: Cardiovascular;  Laterality: N/A;  . Cardiac catheterization N/A 08/07/2014    Procedure: Coronary Stent Intervention;  Surgeon: Lorretta Harp, MD;  Location: [facility];  Service: Cardiovascular;  Laterality: N/A;     Allergies:  Allergies  Allergen Reactions  . Sulfa Antibiotics Itching    hives  . Morphine And Related     Makes patient feel weird   Home Medications Current Facility-Administered Medications  Medication Dose Route Frequency Provider Last Rate Last Dose  . fentaNYL (SUBLIMAZE) injection    PRN Sherren Mocha, MD   25 mcg at 03/09/15 2349  . heparin 5000 UNIT/ML injection           . midazolam (VERSED) injection    PRN Sherren Mocha, MD   1 mg at 03/09/15 2348     Family History  Problem Relation Age of Onset  . Acute myelogenous leukemia Brother   . CAD Brother   . Diabetes Mellitus II Brother   . Heart disease Father 35    CABG      Social History   Social History  . Marital Status: Legally Separated    Spouse Name: N/A  . Number of Children: N/A  . Years of Education: N/A   Occupational History  . Not on file.   Social History Main Topics  . Smoking status: Former Smoker    Quit date: 02/07/2008  . Smokeless tobacco: Former Systems developer  . Alcohol Use: No  . Drug Use: No  . Sexual Activity: Not on file   Other Topics Concern  . Not on file   Social History Narrative     Review of Systems: General: negative for chills, fever, night sweats or weight changes.  Cardiovascular: chest pain, dyspnea negative for dyspnea on exertion, edema, orthopnea, palpitations, paroxysmal nocturnal dyspnea Dermatological: negative for rash Respiratory: negative for cough or wheezing Urologic: negative for hematuria Abdominal: negative for nausea, vomiting, diarrhea, bright red blood per rectum, melena, or hematemesis Neurologic: negative for visual changes, syncope, or dizziness Endocrine: no diabetes, no hypothyroidism Immunological: no lymph adenopathy Psych: non homicidal/suicidal  Physical Exam: Vitals: BP 182/93 mmHg  Pulse 90  Temp(Src) 98.1 F (36.7 C) (Oral)  Resp 22  Ht 5\' 10"  (1.778 m)  Wt 90.719 kg (200 lb)  BMI 28.70 kg/m2  SpO2 100% General: not in acute distress Neck: JVP flat, neck supple Heart: regular rate and rhythm, S1, S2, no murmurs, PMI shifted laterally  Lungs: CTAB  GI: non tender, non distended, bowel sounds present Extremities: no edema Neuro: AAO x 3  Psych: normal affect, no anxiety  Labs:   Results for orders placed or performed during the hospital encounter of 03/09/15 (from the past 24 hour(s))  CBC     Status: Abnormal (Preliminary result)   Collection Time: 03/09/15 11:23 PM  Result Value Ref Range   WBC 5.2 4.0 - 10.5 K/uL   RBC 2.91 (L) 4.22 - 5.81 MIL/uL   Hemoglobin 8.9 (L) 13.0 - 17.0 g/dL   HCT 26.3 (L) 39.0 - 52.0 %   MCV 90.4 78.0 - 100.0 fL   MCH 30.6 26.0  - 34.0 pg   MCHC 33.8 30.0 - 36.0 g/dL   RDW 16.5 (H) 11.5 - 15.5 %   Platelets PENDING 150 - 400 K/uL  Differential     Status: Abnormal   Collection Time: 03/09/15 11:23 PM  Result Value Ref Range   Neutrophils Relative % 77 %   Neutro Abs 4.0 1.7 - 7.7 K/uL   Lymphocytes Relative 12 %   Lymphs Abs 0.6 (L) 0.7 - 4.0 K/uL   Monocytes Relative 10 %   Monocytes Absolute 0.5 0.1 - 1.0 K/uL  Eosinophils Relative 1 %   Eosinophils Absolute 0.0 0.0 - 0.7 K/uL   Basophils Relative 0 %   Basophils Absolute 0.0 0.0 - 0.1 K/uL  I-stat troponin, ED  (not at [facility], [facility])     Status: None   Collection Time: 03/09/15 11:24 PM  Result Value Ref Range   Troponin i, poc 0.06 0.00 - 0.08 ng/mL   Comment 3          I-Stat Chem 8, ED  (not at [facility], [facility])     Status: Abnormal   Collection Time: 03/09/15 11:30 PM  Result Value Ref Range   Sodium 138 135 - 145 mmol/L   Potassium 4.3 3.5 - 5.1 mmol/L   Chloride 100 (L) 101 - 111 mmol/L   BUN 41 (H) 6 - 20 mg/dL   Creatinine, Ser 5.10 (H) 0.61 - 1.24 mg/dL   Glucose, Bld 501 (H) 65 - 99 mg/dL   Calcium, Ion 0.90 (L) 1.13 - 1.30 mmol/L   TCO2 22 0 - 100 mmol/L   Hemoglobin 9.2 (L) 13.0 - 17.0 g/dL   HCT 27.0 (L) 39.0 - 52.0 %     Radiology/Studies: No results found.  EKG: normal sinus rhythm, with ST elevation   Echo: 12/24/2014 - Left ventricle: The cavity size was mildly dilated. Systolic function was moderately to severely reduced. The estimated ejection fraction was in the range of 30% to 35%. Diffuse hypokinesis. - Aortic valve: Valve area (Vmax): 3.01 cm^2. - Mitral valve: There was mild regurgitation.  Cardiac cath: 03/09/2015  Prox RCA lesion, 100% stenosed.  Ost LAD lesion, 100% stenosed.  LIMA .  Widely patent LIMA. The graft inserts in the mid-LAD and supplies the proximal LAD and diagonals in retrograde fashion. The right PDA is also collateralized from septals of the LAD  Ost Cx lesion, 60% stenosed. The lesion was  previously treated with a stent (unknown type).  There is severe left ventricular systolic dysfunction.  1. Severe native 3 vessel CAD with total occlusion of the LAD and RCA, and moderate in-stent restenosis in the distal left main stent extending into the proximal circumflex.  2. Severe segmental LV dysfunction with severely elevated LVEDP  Suspect combination of hypertensive emergency, uncontrolled diabetes, and diffuse ischemia. In addition, question whether AV fistula may have high-flow and contribute to ischemia with a LIMA graft (pt with a loud diastolic murmur across the precordium). Recommend medical treatment with NTG, heparin, beta-blockade, etc. Discussed findings with family at length.  Medical decision making:  Discussed care with the patient Discussed care with the physician on the phone Reviewed labs and imaging personally Reviewed prior records  ASSESSMENT AND PLAN:  This is a 70 y.o.white male with known history of MI s/p CABG 2001 (LIMA to LAD patent and SVG x 3 all occluded), ESRD on HD tues, thur, GERD, depression, HLD, HTN, ischemic cardiomyopathy (LVEF 25%) who presented with chest pain today.  Active Problems:   Hx of CABG 2001   Type 2 diabetes mellitus with renal manifestations ([place])   Ischemic cardiomyopathy   Essential hypertension   CAD s/p CABG 2001, prior POBA, PCI/DES July 2016   Stable angina (HCC)   Acute MI, inferior wall, initial episode of care [facility])   Hypertensive urgency   Chronic combined systolic and diastolic heart failure ([place])  Chest pain likely demand related EKG showed ST accentuation in the inferior leads from baseline ST elevation Treat HTN  Hypertension urgency NTG gtt  Hydralazine prn Restart home meds  Chronic combined systolic and diastolic heart failure Continue coreg 3.125 mg BID, imdur 60 mg daily, ranexa 500 mg BID  ESRD on HD Had dialysis today   Coronary artery disease in native arteries Continue aspirin,  brilinta, coreg, imdur and crestor   Diabetes mellitus with circulatory disorder  SSI, finger sticks No oral hypoglycemics HbA1c  Signed, Flossie Dibble, MD MS 03/09/2015, 11:51 PM

## 2015-03-09 NOTE — ED Notes (Signed)
Per ems-- pt c/o 7./10 cp today. ekg shows STEMI. Pt had stent placed July 2016. 1 in nitro paste in place. Ems also administered 324 asa. Pt sts platelet are low.

## 2015-03-09 NOTE — ED Notes (Signed)
Pt takes dialysis tues., thurs.

## 2015-03-10 ENCOUNTER — Encounter (HOSPITAL_COMMUNITY): Payer: Self-pay | Admitting: Cardiovascular Disease

## 2015-03-10 DIAGNOSIS — I160 Hypertensive urgency: Secondary | ICD-10-CM | POA: Diagnosis present

## 2015-03-10 DIAGNOSIS — N186 End stage renal disease: Secondary | ICD-10-CM | POA: Diagnosis present

## 2015-03-10 DIAGNOSIS — E785 Hyperlipidemia, unspecified: Secondary | ICD-10-CM | POA: Diagnosis present

## 2015-03-10 DIAGNOSIS — I5042 Chronic combined systolic (congestive) and diastolic (congestive) heart failure: Secondary | ICD-10-CM

## 2015-03-10 DIAGNOSIS — Z87891 Personal history of nicotine dependence: Secondary | ICD-10-CM | POA: Diagnosis not present

## 2015-03-10 DIAGNOSIS — E1122 Type 2 diabetes mellitus with diabetic chronic kidney disease: Secondary | ICD-10-CM | POA: Diagnosis present

## 2015-03-10 DIAGNOSIS — I509 Heart failure, unspecified: Secondary | ICD-10-CM | POA: Diagnosis not present

## 2015-03-10 DIAGNOSIS — Z794 Long term (current) use of insulin: Secondary | ICD-10-CM | POA: Diagnosis not present

## 2015-03-10 DIAGNOSIS — I132 Hypertensive heart and chronic kidney disease with heart failure and with stage 5 chronic kidney disease, or end stage renal disease: Secondary | ICD-10-CM | POA: Diagnosis present

## 2015-03-10 DIAGNOSIS — I255 Ischemic cardiomyopathy: Secondary | ICD-10-CM | POA: Diagnosis present

## 2015-03-10 DIAGNOSIS — E1142 Type 2 diabetes mellitus with diabetic polyneuropathy: Secondary | ICD-10-CM | POA: Diagnosis present

## 2015-03-10 DIAGNOSIS — T82855A Stenosis of coronary artery stent, initial encounter: Secondary | ICD-10-CM | POA: Diagnosis present

## 2015-03-10 DIAGNOSIS — Z992 Dependence on renal dialysis: Secondary | ICD-10-CM | POA: Diagnosis not present

## 2015-03-10 DIAGNOSIS — Z7982 Long term (current) use of aspirin: Secondary | ICD-10-CM | POA: Diagnosis not present

## 2015-03-10 DIAGNOSIS — I110 Hypertensive heart disease with heart failure: Secondary | ICD-10-CM | POA: Insufficient documentation

## 2015-03-10 DIAGNOSIS — I161 Hypertensive emergency: Secondary | ICD-10-CM | POA: Diagnosis present

## 2015-03-10 DIAGNOSIS — I2109 ST elevation (STEMI) myocardial infarction involving other coronary artery of anterior wall: Secondary | ICD-10-CM | POA: Diagnosis present

## 2015-03-10 DIAGNOSIS — R079 Chest pain, unspecified: Secondary | ICD-10-CM | POA: Diagnosis present

## 2015-03-10 DIAGNOSIS — I252 Old myocardial infarction: Secondary | ICD-10-CM | POA: Diagnosis not present

## 2015-03-10 DIAGNOSIS — I25118 Atherosclerotic heart disease of native coronary artery with other forms of angina pectoris: Secondary | ICD-10-CM | POA: Diagnosis present

## 2015-03-10 DIAGNOSIS — F418 Other specified anxiety disorders: Secondary | ICD-10-CM | POA: Diagnosis present

## 2015-03-10 DIAGNOSIS — I2119 ST elevation (STEMI) myocardial infarction involving other coronary artery of inferior wall: Secondary | ICD-10-CM | POA: Diagnosis not present

## 2015-03-10 DIAGNOSIS — Y831 Surgical operation with implant of artificial internal device as the cause of abnormal reaction of the patient, or of later complication, without mention of misadventure at the time of the procedure: Secondary | ICD-10-CM | POA: Diagnosis present

## 2015-03-10 DIAGNOSIS — I2510 Atherosclerotic heart disease of native coronary artery without angina pectoris: Secondary | ICD-10-CM | POA: Diagnosis not present

## 2015-03-10 DIAGNOSIS — Z9114 Patient's other noncompliance with medication regimen: Secondary | ICD-10-CM | POA: Diagnosis not present

## 2015-03-10 DIAGNOSIS — Z8546 Personal history of malignant neoplasm of prostate: Secondary | ICD-10-CM | POA: Diagnosis not present

## 2015-03-10 LAB — CBC
HEMATOCRIT: 26.3 % — AB (ref 39.0–52.0)
HEMOGLOBIN: 8.9 g/dL — AB (ref 13.0–17.0)
MCH: 30.6 pg (ref 26.0–34.0)
MCHC: 33.8 g/dL (ref 30.0–36.0)
MCV: 90.4 fL (ref 78.0–100.0)
Platelets: 92 10*3/uL — ABNORMAL LOW (ref 150–400)
RBC: 2.91 MIL/uL — AB (ref 4.22–5.81)
RDW: 16.5 % — ABNORMAL HIGH (ref 11.5–15.5)
WBC: 5.2 10*3/uL (ref 4.0–10.5)

## 2015-03-10 LAB — MRSA PCR SCREENING: MRSA by PCR: NEGATIVE

## 2015-03-10 LAB — GLUCOSE, CAPILLARY
GLUCOSE-CAPILLARY: 398 mg/dL — AB (ref 65–99)
GLUCOSE-CAPILLARY: 164 mg/dL — AB (ref 65–99)
GLUCOSE-CAPILLARY: 154 mg/dL — AB (ref 65–99)
Glucose-Capillary: 206 mg/dL — ABNORMAL HIGH (ref 65–99)

## 2015-03-10 LAB — BASIC METABOLIC PANEL
ANION GAP: 14 (ref 5–15)
BUN: 42 mg/dL — ABNORMAL HIGH (ref 6–20)
CALCIUM: 7.6 mg/dL — AB (ref 8.9–10.3)
CO2: 22 mmol/L (ref 22–32)
CREATININE: 5.12 mg/dL — AB (ref 0.61–1.24)
Chloride: 101 mmol/L (ref 101–111)
GFR, EST AFRICAN AMERICAN: 12 mL/min — AB (ref >60)
GFR, EST NON AFRICAN AMERICAN: 11 mL/min — AB (ref >60)
Glucose, Bld: 495 mg/dL — ABNORMAL HIGH (ref 65–99)
Potassium: 4.3 mmol/L (ref 3.5–5.1)
SODIUM: 137 mmol/L (ref 135–145)

## 2015-03-10 LAB — TROPONIN I
Troponin I: 0.16 ng/mL — ABNORMAL HIGH (ref <0.031)
Troponin I: 17.19 ng/mL (ref <0.031)
Troponin I: 24.48 ng/mL (ref <0.031)

## 2015-03-10 LAB — POCT ACTIVATED CLOTTING TIME
ACTIVATED CLOTTING TIME: 178 s
ACTIVATED CLOTTING TIME: 162 s
Activated Clotting Time: 245 seconds

## 2015-03-10 LAB — LIPID PANEL
Cholesterol: 77 mg/dL (ref 0–200)
HDL: 23 mg/dL — ABNORMAL LOW (ref >40)
LDL CALC: 28 mg/dL (ref 0–99)
TRIGLYCERIDES: 130 mg/dL (ref <150)
Total CHOL/HDL Ratio: 3.3 RATIO
VLDL: 26 mg/dL (ref 0–40)

## 2015-03-10 LAB — HEPARIN LEVEL (UNFRACTIONATED): Heparin Unfractionated: 0.23 IU/mL — ABNORMAL LOW (ref 0.30–0.70)

## 2015-03-10 MED ORDER — SODIUM CHLORIDE 0.9 % IV SOLN: 250.0000 mL | INTRAVENOUS | Status: DC | PRN

## 2015-03-10 MED ORDER — HEPARIN (PORCINE) IN NACL 100-0.45 UNIT/ML-% IJ SOLN
1750.0000 [IU]/h | INTRAMUSCULAR | Status: DC
  Administered 2015-03-10: 1500 [IU]/h via INTRAVENOUS
  Administered 2015-03-11: 1750 [IU]/h via INTRAVENOUS
  Filled 2015-03-10 (×2): qty 250

## 2015-03-10 MED ORDER — ONDANSETRON HCL 4 MG/2ML IJ SOLN
4.0000 mg | Freq: Four times a day (QID) | INTRAMUSCULAR | Status: DC | PRN
Start: 2015-03-10 — End: 2015-03-11

## 2015-03-10 MED ORDER — ACETAMINOPHEN 325 MG PO TABS: 650.0000 mg | ORAL_TABLET | ORAL | Status: DC | PRN

## 2015-03-10 MED ORDER — HEPARIN (PORCINE) IN NACL 100-0.45 UNIT/ML-% IJ SOLN: 1500.0000 [IU]/h | INTRAMUSCULAR | Status: DC

## 2015-03-10 MED ORDER — SODIUM CHLORIDE 0.9% FLUSH: 3.0000 mL | INTRAVENOUS | Status: DC | PRN

## 2015-03-10 MED ORDER — CARVEDILOL 6.25 MG PO TABS
6.2500 mg | ORAL_TABLET | Freq: Two times a day (BID) | ORAL | Status: DC
  Administered 2015-03-10 – 2015-03-11 (×4): 6.25 mg via ORAL
  Filled 2015-03-10 (×4): qty 1

## 2015-03-10 MED ORDER — SODIUM CHLORIDE 0.9% FLUSH
3.0000 mL | Freq: Two times a day (BID) | INTRAVENOUS | Status: DC
  Administered 2015-03-10 (×3): 3 mL via INTRAVENOUS

## 2015-03-10 MED ORDER — ALPRAZOLAM 0.5 MG PO TABS
0.5000 mg | ORAL_TABLET | Freq: Three times a day (TID) | ORAL | Status: DC | PRN
  Administered 2015-03-10 (×2): 0.5 mg via ORAL
  Filled 2015-03-10 (×2): qty 1

## 2015-03-10 MED ORDER — NITROGLYCERIN IN D5W 200-5 MCG/ML-% IV SOLN
0.0000 ug/min | INTRAVENOUS | Status: DC
  Filled 2015-03-10: qty 250

## 2015-03-10 MED ORDER — INSULIN GLARGINE 100 UNIT/ML ~~LOC~~ SOLN
10.0000 [IU] | Freq: Every day | SUBCUTANEOUS | Status: DC
  Administered 2015-03-10 (×2): 10 [IU] via SUBCUTANEOUS
  Filled 2015-03-10 (×3): qty 0.1

## 2015-03-10 MED ORDER — RANOLAZINE ER 500 MG PO TB12: 500.0000 mg | ORAL_TABLET | Freq: Two times a day (BID) | ORAL | Status: DC

## 2015-03-10 MED ORDER — IOHEXOL 350 MG/ML SOLN
INTRAVENOUS | Status: DC | PRN
  Administered 2015-03-10: 115 mL via INTRAVENOUS

## 2015-03-10 MED ORDER — RANOLAZINE ER 500 MG PO TB12
500.0000 mg | ORAL_TABLET | Freq: Two times a day (BID) | ORAL | Status: DC
Start: 2015-03-10 — End: 2015-03-11
  Administered 2015-03-10 (×2): 500 mg via ORAL
  Filled 2015-03-10 (×2): qty 1

## 2015-03-10 MED ORDER — RANOLAZINE ER 500 MG PO TB12: 1000.0000 mg | ORAL_TABLET | Freq: Every day | ORAL | Status: DC

## 2015-03-10 MED ORDER — INSULIN ASPART 100 UNIT/ML ~~LOC~~ SOLN
0.0000 [IU] | Freq: Every day | SUBCUTANEOUS | Status: DC
  Administered 2015-03-10: 5 [IU] via SUBCUTANEOUS

## 2015-03-10 MED ORDER — SODIUM CHLORIDE 0.9% FLUSH
3.0000 mL | Freq: Two times a day (BID) | INTRAVENOUS | Status: DC
  Administered 2015-03-10 (×2): 3 mL via INTRAVENOUS

## 2015-03-10 MED ORDER — INSULIN ASPART 100 UNIT/ML ~~LOC~~ SOLN
0.0000 [IU] | Freq: Three times a day (TID) | SUBCUTANEOUS | Status: DC
  Administered 2015-03-10: 3 [IU] via SUBCUTANEOUS
  Administered 2015-03-10: 5 [IU] via SUBCUTANEOUS
  Administered 2015-03-10: 3 [IU] via SUBCUTANEOUS
  Administered 2015-03-11: 2 [IU] via SUBCUTANEOUS

## 2015-03-10 MED ORDER — ASPIRIN EC 81 MG PO TBEC
81.0000 mg | DELAYED_RELEASE_TABLET | Freq: Every day | ORAL | Status: DC
  Administered 2015-03-10 – 2015-03-11 (×2): 81 mg via ORAL
  Filled 2015-03-10 (×2): qty 1

## 2015-03-10 MED ORDER — TICAGRELOR 90 MG PO TABS
90.0000 mg | ORAL_TABLET | Freq: Two times a day (BID) | ORAL | Status: DC
  Administered 2015-03-10 – 2015-03-11 (×3): 90 mg via ORAL
  Filled 2015-03-10 (×3): qty 1

## 2015-03-10 MED ORDER — FERROUS SULFATE 325 (65 FE) MG PO TABS
325.0000 mg | ORAL_TABLET | Freq: Every day | ORAL | Status: DC
  Administered 2015-03-10 – 2015-03-11 (×2): 325 mg via ORAL
  Filled 2015-03-10 (×2): qty 1

## 2015-03-10 MED ORDER — METOPROLOL TARTRATE 1 MG/ML IV SOLN
INTRAVENOUS | Status: DC | PRN
  Administered 2015-03-10: 5 mg via INTRAVENOUS

## 2015-03-10 MED ORDER — OXYCODONE-ACETAMINOPHEN 5-325 MG PO TABS
1.0000 | ORAL_TABLET | Freq: Four times a day (QID) | ORAL | Status: DC | PRN
  Administered 2015-03-10: 1 via ORAL
  Filled 2015-03-10: qty 1

## 2015-03-10 MED ORDER — ATROPINE SULFATE 0.1 MG/ML IJ SOLN
INTRAMUSCULAR | Status: AC
  Filled 2015-03-10: qty 10

## 2015-03-10 MED ORDER — PREGABALIN 50 MG PO CAPS
75.0000 mg | ORAL_CAPSULE | Freq: Every day | ORAL | Status: DC
  Administered 2015-03-10 – 2015-03-11 (×2): 75 mg via ORAL
  Filled 2015-03-10 (×2): qty 1

## 2015-03-10 MED ORDER — ROSUVASTATIN CALCIUM 20 MG PO TABS
20.0000 mg | ORAL_TABLET | Freq: Every day | ORAL | Status: DC
  Administered 2015-03-10: 20 mg via ORAL
  Filled 2015-03-10: qty 1

## 2015-03-10 MED FILL — Nitroglycerin IV Soln 100 MCG/ML in D5W: INTRA_ARTERIAL | Qty: 10 | Status: AC

## 2015-03-10 MED FILL — Lidocaine HCl Local Preservative Free (PF) Inj 1%: INTRAMUSCULAR | Qty: 30 | Status: AC

## 2015-03-10 MED FILL — Heparin Sodium (Porcine) 2 Unit/ML in Sodium Chloride 0.9%: INTRAMUSCULAR | Qty: 1000 | Status: AC

## 2015-03-10 NOTE — Progress Notes (Signed)
[place] for heparin Indication: CAD  Allergies  Allergen Reactions  . Sulfa Antibiotics Itching    hives  . Morphine And Related     Makes patient feel weird    Patient Measurements: Height: 5\' 10"  (177.8 cm) Weight: 203 lb 11.3 oz (92.4 kg) IBW/kg (Calculated) : 73  Vital Signs: Temp: 97.4 F (36.3 C) (02/01 1700) Temp Source: Oral (02/01 1700) BP: 115/79 mmHg (02/01 1800) Pulse Rate: 66 (02/01 1800)  Labs:  Recent Labs  03/09/15 2323 03/09/15 2330 03/10/15 0115 03/10/15 0823 03/10/15 1225 03/10/15 1925  HGB 8.9* 9.2*  --   --   --   --   HCT 26.3* 27.0*  --   --   --   --   PLT 92*  --   --   --   --   --   APTT 31  --   --   --   --   --   LABPROT 14.8  --   --   --   --   --   INR 1.15  --   --   --   --   --   HEPARINUNFRC  --   --   --   --   --  0.23*  CREATININE 5.12* 5.10*  --   --   --   --   TROPONINI  --   --  0.16* 17.19* 24.48*  --     Estimated Creatinine Clearance: 16.1 mL/min (by C-G formula based on Cr of 5.1).   Scheduled:  . aspirin EC  81 mg Oral Daily  . carvedilol  6.25 mg Oral BID WC  . ferrous sulfate  325 mg Oral Q breakfast  . insulin aspart  0-15 Units Subcutaneous TID WC  . insulin aspart  0-5 Units Subcutaneous QHS  . insulin glargine  10 Units Subcutaneous QHS  . pregabalin  75 mg Oral Daily  . ranolazine  500 mg Oral BID  . rosuvastatin  20 mg Oral QHS  . sodium chloride flush  3 mL Intravenous Q12H  . sodium chloride flush  3 mL Intravenous Q12H  . ticagrelor  90 mg Oral BID   Infusions:  . heparin 1,500 Units/hr (03/10/15 1800)  . nitroGLYCERIN Stopped (03/10/15 1143)    Assessment: 70yo male had stent to LM 5mo ago presents as code STEMI, now s/p cath with 3VCAD (per MD unchanged since the cath in June 2016. He is noted with troponin elevation post cath and heparin was restarted. First HL was low at 0.23. No issues noted.  Goal of Therapy:  Heparin level 0.3-0.7  units/ml Monitor platelets by anticoagulation protocol: Yes   Plan:  Increase heparin gtt to 1,650 units/hr Monitor daily HL, CBC, s/s of bleed Check 8 hr HL  Elenor Quinones, PharmD, [facility] Clinical Pharmacist Pager [phone] 03/10/2015 8:09 PM

## 2015-03-10 NOTE — Progress Notes (Signed)
Right femoral sheath removed. Site oozing prior to pull. Pressure was applied for 20 mins beginning at 04:19 and ending at 04:39. Patient stable during and after removal. Site is Level 0. Patient instructed about post sheath removal activity.

## 2015-03-10 NOTE — Care Management Note (Signed)
Case Management Note  Patient Details  Name: Joseph [name] MRN: [id number] Date of Birth: [birth year]  Subjective/Objective:    Adm w mi                Action/Plan: lives alone, pcp dr Nicky Pugh   Expected Discharge Date:                  Expected Discharge Plan:  Home/Self Care  In-House Referral:     Discharge planning Services     Post Acute Care Choice:    Choice offered to:     DME Arranged:    DME Agency:     HH Arranged:    [place] Agency:     Status of Service:     Medicare Important Message Given:    Date Medicare IM Given:    Medicare IM give by:    Date Additional Medicare IM Given:    Additional Medicare Important Message give by:     If discussed at [place] of Stay Meetings, dates discussed:    Additional Comments: ur review done  Lacretia Leigh, RN 03/10/2015, 8:29 AM

## 2015-03-10 NOTE — Progress Notes (Signed)
ANTICOAGULATION CONSULT NOTE - Initial Consult  Pharmacy Consult for heparin Indication: CAD  Allergies  Allergen Reactions  . Sulfa Antibiotics Itching    hives  . Morphine And Related     Makes patient feel weird    Patient Measurements: Height: 5\' 10"  (177.8 cm) Weight: 200 lb (90.719 kg) IBW/kg (Calculated) : 73  Vital Signs: Temp: 98.1 F (36.7 C) (01/31 2326) Temp Source: Oral (01/31 2326) BP: 154/83 mmHg (02/01 0030) Pulse Rate: 81 (02/01 0030)  Labs:  Recent Labs  03/09/15 2323 03/09/15 2330  HGB 8.9* 9.2*  HCT 26.3* 27.0*  PLT 92*  --   APTT 31  --   LABPROT 14.8  --   INR 1.15  --   CREATININE 5.12* 5.10*    Estimated Creatinine Clearance: 15.9 mL/min (by C-G formula based on Cr of 5.1).   Medical History: Past Medical History  Diagnosis Date  . Hypertensive heart disease   . Hypercholesterolemia   . Prostate cancer ([place])   . Hepatosplenomegaly 2015    fibrosis, no cirrhosis on 06/2013 liver biopsy  . Thrombocytopenia ([place]) 2015  . Depression with anxiety   . IDDM (insulin dependent diabetes mellitus) ([place])     INSULIN DEPENDENT  . GERD (gastroesophageal reflux disease)   . History of shingles   . Ischemic cardiomyopathy     a. echo 08/06/2014: EF 25-30%, multiple WMA, mod DD, PASP 49 mm Hg;  b. 12/2014 Echo: EF 30-35%, diff HK, mild MR.  . CKD (chronic kidney disease), stage IV ([place])     a. previously on dialysis 11/2013->04/2014;  b. 12/2014 Dialysis resumed.  . Coronary artery disease, occlusive     a. 2001 MI s/p 4v CABG (LIMA-LAD, SVG-D2, SVG-OM1, SVG-right PDA, SVG-right PL1); b. cath 07/30/2014 vein grafts all down, patent LIMA to LAD, significant LM and ost LCx dx; c. 08/07/2014 PCI Synergy DES 2.5 mm x 18 mm to dist LM and ost LCx, rec lifelong DAPT.  [name] Chronic combined systolic and diastolic CHF, NYHA class 2 ([place])     a. 08/2013 Echo: EF 40-45%, impaired relaxation;  b. 07/2014 Echo: EF 25-30%;  c. 12/2014 Echo: EF 30-35%, diff HK, mild MR.   . Gallstone pancreatitis 2015  . ESRD (end stage renal disease) ([place])     Medications:  Prescriptions prior to admission  Medication Sig Dispense Refill Last Dose  . ALPRAZolam (XANAX) 0.5 MG tablet Take 0.5 mg by mouth as needed for anxiety.   Taking  . aspirin EC 81 MG tablet Take 81 mg by mouth daily.   Taking  . carvedilol (COREG) 3.125 MG tablet Take 1 tablet (3.125 mg total) by mouth 2 (two) times daily with a meal.   Taking  . ferrous sulfate 325 (65 FE) MG EC tablet Take 325 mg by mouth daily with breakfast.    Taking  . furosemide (LASIX) 40 MG tablet Take 1 tablet (40 mg total) by mouth daily as needed for fluid. 90 tablet 3   . insulin glargine (LANTUS) 100 UNIT/ML injection Inject 0.1 mLs (10 Units total) into the skin at bedtime. 10 mL 11 Taking  . isosorbide mononitrate (IMDUR) 60 MG 24 hr tablet Take 1.5 tablets (90 mg total) by mouth daily. 135 tablet 3 Taking  . nitroGLYCERIN (NITROLINGUAL) 0.4 MG/SPRAY spray Place 1 spray under the tongue every 5 (five) minutes x 3 doses as needed for chest pain. Do not use together with sublingual nitro 12 g 5 Taking  . oxyCODONE-acetaminophen (PERCOCET/ROXICET) 5-325  MG tablet Take 1 tablet by mouth every 6 (six) hours as needed for severe pain.   Taking  . pregabalin (LYRICA) 75 MG capsule Take 1 capsule (75 mg total) by mouth daily.   Taking  . ranolazine (RANEXA) 500 MG 12 hr tablet Take 1 tablet (500 mg total) by mouth 2 (two) times daily. 180 tablet 1   . rosuvastatin (CRESTOR) 20 MG tablet Take 1 tablet (20 mg total) by mouth at bedtime. 90 tablet 1   . ticagrelor (BRILINTA) 90 MG TABS tablet Take 1 tablet (90 mg total) by mouth 2 (two) times daily. 60 tablet 6 Taking   Scheduled:  . aspirin EC  81 mg Oral Daily  . carvedilol  6.25 mg Oral BID WC  . ferrous sulfate  325 mg Oral Q breakfast  . heparin      . insulin aspart  0-15 Units Subcutaneous TID WC  . insulin aspart  0-5 Units Subcutaneous QHS  . insulin glargine  10 Units  Subcutaneous QHS  . pregabalin  75 mg Oral Daily  . ranolazine  500 mg Oral BID  . rosuvastatin  20 mg Oral QHS  . sodium chloride flush  3 mL Intravenous Q12H  . sodium chloride flush  3 mL Intravenous Q12H  . ticagrelor  90 mg Oral BID   Infusions:  . nitroGLYCERIN 20 mcg/min (03/10/15 0055)    Assessment: 70yo male had stent to LM 35mo ago presents as code STEMI, now s/p cath, to start heparin.  Goal of Therapy:  Heparin level 0.3-0.7 units/ml Monitor platelets by anticoagulation protocol: Yes   Plan:  Will begin heparin gtt at 1500 units/hr (based on rates in July) starting 8hr after sheath removed (RN to call pharmacy) and monitor heparin levels and CBC.  Wynona Neat, PharmD, BCPS  03/10/2015,12:57 AM

## 2015-03-10 NOTE — Progress Notes (Addendum)
[place] for heparin Indication: CAD  Allergies  Allergen Reactions  . Sulfa Antibiotics Itching    hives  . Morphine And Related     Makes patient feel weird    Patient Measurements: Height: 5\' 10"  (177.8 cm) Weight: 203 lb 11.3 oz (92.4 kg) IBW/kg (Calculated) : 73  Vital Signs: Temp: 97.7 F (36.5 C) (02/01 1200) Temp Source: Oral (02/01 1200) BP: 132/90 mmHg (02/01 1220) Pulse Rate: 56 (02/01 1220)  Labs:  Recent Labs  03/09/15 2323 03/09/15 2330 03/10/15 0115 03/10/15 0823  HGB 8.9* 9.2*  --   --   HCT 26.3* 27.0*  --   --   PLT 92*  --   --   --   APTT 31  --   --   --   LABPROT 14.8  --   --   --   INR 1.15  --   --   --   CREATININE 5.12* 5.10*  --   --   TROPONINI  --   --  0.16* 17.19*    Estimated Creatinine Clearance: 16.1 mL/min (by C-G formula based on Cr of 5.1).   Scheduled:  . aspirin EC  81 mg Oral Daily  . carvedilol  6.25 mg Oral BID WC  . ferrous sulfate  325 mg Oral Q breakfast  . insulin aspart  0-15 Units Subcutaneous TID WC  . insulin aspart  0-5 Units Subcutaneous QHS  . insulin glargine  10 Units Subcutaneous QHS  . pregabalin  75 mg Oral Daily  . ranolazine  500 mg Oral BID  . rosuvastatin  20 mg Oral QHS  . sodium chloride flush  3 mL Intravenous Q12H  . sodium chloride flush  3 mL Intravenous Q12H  . ticagrelor  90 mg Oral BID   Infusions:  . heparin    . nitroGLYCERIN 10 mcg/min (03/10/15 [id number])    Assessment: 70yo male had stent to LM 65mo ago presents as code STEMI, now s/p cath with 3VCAD (per MD unchanged since the cath in June 2016. He is noted with troponin elevation post cath and heparin was restarted. -sheath removed at about 4:40am -plans noted for heparin for 48hrs post admission  Goal of Therapy:  Heparin level 0.3-0.7 units/ml Monitor platelets by anticoagulation protocol: Yes   Plan:  -Begin heparin at 1500 units/hr (based on heparin infusion rate in  08/2014) -Heparin level in 8 hours and daily wth CBC daily  Hildred Laser, Pharm D 03/10/2015 1:04 PM

## 2015-03-10 NOTE — Progress Notes (Signed)
Subjective:  He reports his chest pain is much better. Denies any SOB.   Objective:  Vital Signs in the last 24 hours: Temp:  [97.8 F (36.6 C)-98.1 F (36.7 C)] 97.9 F (36.6 C) (02/01 0400) Pulse Rate:  [0-120] 65 (02/01 0700) Resp:  [0-30] 19 (02/01 0700) BP: (97-183)/(61-101) 122/65 mmHg (02/01 0700) SpO2:  [0 %-100 %] 98 % (02/01 0700) Arterial Line BP: (125-190)/(57-81) 129/58 mmHg (02/01 0415) Weight:  [200 lb (90.719 kg)-203 lb 11.3 oz (92.4 kg)] 203 lb 11.3 oz (92.4 kg) (02/01 0050)  Intake/Output from previous day: 01/31 0701 - 02/01 0700 In: 161.2 [I.V.:161.2] Out: -   Physical Exam: General: resting in bed, NAD HEENT: [place]/AT, EOMI, sclera anicteric, mucus membranes moist Lungs: CTA bilaterally CV: RRR, 2/6 diastolic murmur heard best at LUSB Abd: BS+, soft, non-tender Ext: trace peripheral edema bilaterally, left forearm AV fistula with good thrill Skin: warm/dry no rash   Lab Results:  Recent Labs  03/09/15 2323 03/09/15 2330  WBC 5.2  --   HGB 8.9* 9.2*  PLT 92*  --     Recent Labs  03/09/15 2323 03/09/15 2330  NA 137 138  K 4.3 4.3  CL 101 100*  CO2 22  --   GLUCOSE 495* 501*  BUN 42* 41*  CREATININE 5.12* 5.10*    Recent Labs  03/10/15 0115  TROPONINI 0.16*    Cardiac Studies: Left Heart Cath/Coronary Angio 03/09/15:  Prox RCA lesion, 100% stenosed.  Ost LAD lesion, 100% stenosed.  LIMA .  Widely patent LIMA. The graft inserts in the mid-LAD and supplies the proximal LAD and diagonals in retrograde fashion. The right PDA is also collateralized from septals of the LAD  Ost Cx lesion, 60% stenosed. The lesion was previously treated with a stent (unknown type).  There is severe left ventricular systolic dysfunction.  1. Severe native 3 vessel CAD with total occlusion of the LAD and RCA, and moderate in-stent restenosis in the distal left main stent extending into the proximal circumflex.  2. Severe segmental LV  dysfunction with severely elevated LVEDP  Suspect combination of hypertensive emergency, uncontrolled diabetes, and diffuse ischemia. In addition, question whether AV fistula may have high-flow and contribute to ischemia with a LIMA graft (pt with a loud diastolic murmur across the precordium). Recommend medical treatment with NTG, heparin, beta-blockade, etc. Discussed findings with family at length.   Assessment/Plan:  70yo man w/ hx of MI s/p CABG 2001 (LIMA to LAD patent and SVG x 3 all occluded), ESRD, ischemic cardiomyopathy (LVEF 25%), and diabetes presenting with chest pain and hypertensive urgency.  Hx CAD s/p CABG 2001 p/w Chest Pain: Cardiac cath on 1/31 revealed severe native 3 vessel CAD with total occlusion of the LAD and RCA and moderate instent restenosis in the distal left main stent extending to the proximal circumflex. This is essentially unchanged from his last cardiac cath in June 2016. He is being managed with medical treatment. He has significant risk factors including prior CAD, HTN, diabetes, and hyperlipidemia. Concern for medication non-compliance as well.  - Continue Brilinta and ASA - Continue Coreg 6.25 mg BID - Continue Crestor 20 mg daily, can consider increasing if lipids significantly elevated  - After discussion with pharmacy, will change Ranexa to 1000 mg QHS due to his ESRD - Continue optimization of risk factors  - Obtain lipid panel - Can restart Imdur   Hypertensive Urgency: Resolving. BPs now in [id number] systolic on his home meds and nitro  gtt. Will wean him off nitro gtt.  - Wean nitro gtt to off - Continue Coreg  Chronic Combined Systolic and Diastolic HF: Last echo in Nov 2016 showed EF 30-35% with diffuse hypokinesis. He does not appear volume overloaded on exam. Will get repeat echo to evaluate his LV function given this acute event. - f/u repeat echo results   ESRD on HD: Last HD on 1/31 prior to hospitalization. He is on a T/Th/Sat schedule.  Will need to consult renal for dialysis if plan to keep him tomorrow. Otherwise can be discharged and get HD at his center. No emergent need for dialysis.   Type 2 DM w/ Peripheral Neuropathy: Last A1c 6.5 in Sept 2016. He takes Lantus 10 units QHS at home. He is also on Lyrica 75 mg daily for his peripheral neuropathy.  - Continue Lantus 10 units QHS - A1c pending - Can consider adding on sensitive ISS for better blood sugar control    Attending note to follow.   Albin Felling, MD, MPH Internal Medicine Resident, PGY-II Pager: [phone] 03/10/2015, 7:58 AM

## 2015-03-11 ENCOUNTER — Inpatient Hospital Stay (HOSPITAL_COMMUNITY): Payer: Medicare Other

## 2015-03-11 DIAGNOSIS — I509 Heart failure, unspecified: Secondary | ICD-10-CM

## 2015-03-11 DIAGNOSIS — Z9861 Coronary angioplasty status: Secondary | ICD-10-CM

## 2015-03-11 DIAGNOSIS — N186 End stage renal disease: Secondary | ICD-10-CM | POA: Insufficient documentation

## 2015-03-11 LAB — RENAL FUNCTION PANEL
ALBUMIN: 3 g/dL — AB (ref 3.5–5.0)
Anion gap: 16 — ABNORMAL HIGH (ref 5–15)
BUN: 51 mg/dL — AB (ref 6–20)
CO2: 21 mmol/L — ABNORMAL LOW (ref 22–32)
CREATININE: 6.22 mg/dL — AB (ref 0.61–1.24)
Calcium: 7.9 mg/dL — ABNORMAL LOW (ref 8.9–10.3)
Chloride: 102 mmol/L (ref 101–111)
GFR calc Af Amer: 10 mL/min — ABNORMAL LOW (ref >60)
GFR, EST NON AFRICAN AMERICAN: 8 mL/min — AB (ref >60)
GLUCOSE: 251 mg/dL — AB (ref 65–99)
PHOSPHORUS: 6.8 mg/dL — AB (ref 2.5–4.6)
POTASSIUM: 5 mmol/L (ref 3.5–5.1)
SODIUM: 139 mmol/L (ref 135–145)

## 2015-03-11 LAB — CBC
HCT: 25.2 % — ABNORMAL LOW (ref 39.0–52.0)
Hemoglobin: 8.7 g/dL — ABNORMAL LOW (ref 13.0–17.0)
MCH: 30.7 pg (ref 26.0–34.0)
MCHC: 34.5 g/dL (ref 30.0–36.0)
MCV: 89 fL (ref 78.0–100.0)
PLATELETS: 79 10*3/uL — AB (ref 150–400)
RBC: 2.83 MIL/uL — AB (ref 4.22–5.81)
RDW: 16.1 % — ABNORMAL HIGH (ref 11.5–15.5)
WBC: 5.1 10*3/uL (ref 4.0–10.5)

## 2015-03-11 LAB — HEMOGLOBIN A1C
Hgb A1c MFr Bld: 9.1 % — ABNORMAL HIGH (ref 4.8–5.6)
Mean Plasma Glucose: 214 mg/dL

## 2015-03-11 LAB — HEPARIN LEVEL (UNFRACTIONATED)
HEPARIN UNFRACTIONATED: 0.29 [IU]/mL — AB (ref 0.30–0.70)
Heparin Unfractionated: 0.51 IU/mL (ref 0.30–0.70)

## 2015-03-11 LAB — GLUCOSE, CAPILLARY
GLUCOSE-CAPILLARY: 180 mg/dL — AB (ref 65–99)
GLUCOSE-CAPILLARY: 102 mg/dL — AB (ref 65–99)
Glucose-Capillary: 129 mg/dL — ABNORMAL HIGH (ref 65–99)

## 2015-03-11 MED ORDER — HEPARIN SODIUM (PORCINE) 1000 UNIT/ML DIALYSIS: 1000.0000 [IU] | INTRAMUSCULAR | Status: DC | PRN

## 2015-03-11 MED ORDER — SODIUM CHLORIDE 0.9 % IV SOLN: 100.0000 mL | INTRAVENOUS | Status: DC | PRN

## 2015-03-11 MED ORDER — ISOSORBIDE MONONITRATE ER 60 MG PO TB24
60.0000 mg | ORAL_TABLET | Freq: Every day | ORAL | Status: DC
  Administered 2015-03-11: 60 mg via ORAL
  Filled 2015-03-11: qty 1

## 2015-03-11 MED ORDER — LIDOCAINE-PRILOCAINE 2.5-2.5 % EX CREA: 1.0000 "application " | TOPICAL_CREAM | CUTANEOUS | Status: DC | PRN

## 2015-03-11 MED ORDER — PERFLUTREN LIPID MICROSPHERE
INTRAVENOUS | Status: AC
  Filled 2015-03-11: qty 10

## 2015-03-11 MED ORDER — ALTEPLASE 2 MG IJ SOLR
2.0000 mg | Freq: Once | INTRAMUSCULAR | Status: DC | PRN
  Filled 2015-03-11: qty 2

## 2015-03-11 MED ORDER — PERFLUTREN LIPID MICROSPHERE
1.0000 mL | INTRAVENOUS | Status: DC | PRN
  Administered 2015-03-11: 2 mL via INTRAVENOUS
  Filled 2015-03-11: qty 10

## 2015-03-11 MED ORDER — CARVEDILOL 6.25 MG PO TABS: 6.2500 mg | ORAL_TABLET | Freq: Two times a day (BID) | ORAL | Status: AC

## 2015-03-11 MED ORDER — LIDOCAINE HCL (PF) 1 % IJ SOLN: 5.0000 mL | INTRAMUSCULAR | Status: DC | PRN

## 2015-03-11 MED ORDER — PENTAFLUOROPROP-TETRAFLUOROETH EX AERO: 1.0000 "application " | INHALATION_SPRAY | CUTANEOUS | Status: DC | PRN

## 2015-03-11 MED ORDER — DARBEPOETIN ALFA 60 MCG/0.3ML IJ SOSY
60.0000 ug | PREFILLED_SYRINGE | INTRAMUSCULAR | Status: DC
  Filled 2015-03-11: qty 0.3

## 2015-03-11 MED ORDER — HEPARIN SODIUM (PORCINE) 1000 UNIT/ML DIALYSIS: 40.0000 [IU]/kg | INTRAMUSCULAR | Status: DC | PRN

## 2015-03-11 NOTE — Progress Notes (Signed)
Echocardiogram 2D Echocardiogram with Definity has been performed.  Tresa Res 03/11/2015, 4:44 PM

## 2015-03-11 NOTE — Progress Notes (Signed)
[place] for heparin Indication: CAD  Allergies  Allergen Reactions  . Sulfa Antibiotics Itching    hives  . Morphine And Related     Makes patient feel weird    Patient Measurements: Height: 5\' 10"  (177.8 cm) Weight: 205 lb 11 oz (93.3 kg) IBW/kg (Calculated) : 73  Vital Signs: Temp: 97.7 F (36.5 C) (02/02 1017) Temp Source: Oral (02/02 1017) BP: 128/65 mmHg (02/02 1243) Pulse Rate: 64 (02/02 1243)  Labs:  Recent Labs  03/09/15 2323 03/09/15 2330 03/10/15 0115 03/10/15 0823 03/10/15 1225 03/10/15 1925 03/11/15 0300 03/11/15 1117 03/11/15 1155  HGB 8.9* 9.2*  --   --   --   --  8.7*  --   --   HCT 26.3* 27.0*  --   --   --   --  25.2*  --   --   PLT 92*  --   --   --   --   --  79*  --   --   APTT 31  --   --   --   --   --   --   --   --   LABPROT 14.8  --   --   --   --   --   --   --   --   INR 1.15  --   --   --   --   --   --   --   --   HEPARINUNFRC  --   --   --   --   --  0.23* 0.29*  --  0.51  CREATININE 5.12* 5.10*  --   --   --   --   --  6.22*  --   TROPONINI  --   --  0.16* 17.19* 24.48*  --   --   --   --     Estimated Creatinine Clearance: 13.2 mL/min (by C-G formula based on Cr of 6.22).   Scheduled:  . aspirin EC  81 mg Oral Daily  . carvedilol  6.25 mg Oral BID WC  . darbepoetin (ARANESP) injection - DIALYSIS  60 mcg Intravenous Q Thu-HD  . ferrous sulfate  325 mg Oral Q breakfast  . insulin aspart  0-15 Units Subcutaneous TID WC  . insulin aspart  0-5 Units Subcutaneous QHS  . insulin glargine  10 Units Subcutaneous QHS  . isosorbide mononitrate  60 mg Oral Daily  . pregabalin  75 mg Oral Daily  . ranolazine  500 mg Oral BID  . rosuvastatin  20 mg Oral QHS  . sodium chloride flush  3 mL Intravenous Q12H  . sodium chloride flush  3 mL Intravenous Q12H  . ticagrelor  90 mg Oral BID   Infusions:  . heparin 1,750 Units/hr (03/11/15 0405)    Assessment: 70yo male had stent to LM 23mo ago  presents as code STEMI, now s/p cath with 3VCAD (per MD unchanged since the cath in June 2016). He is noted with troponin elevation post cath and heparin was restarted. Heparin level is 0.51 and at goal on 1750 units/hr. Heparin to continue until discharge (plans for discharge after HD today).  Goal of Therapy:  Heparin level 0.3-0.7 units/ml Monitor platelets by anticoagulation protocol: Yes   Plan:  -No heparin changes needed -Daily heparin level and CBC while here  Hildred Laser, Pharm D 03/11/2015 1:06 PM

## 2015-03-11 NOTE — Progress Notes (Signed)
Pt walked with RN 390 ft. No CP. Feels well. Discussed MI, restrictions, NTG, tighter DM control, ex and CRPII. Will send referral to [facility] [place] although pt is unsure he will do it again. He enjoys playing pickleball x4 a week. Encouraged him to gradually increase walking before resuming pickleball and to consider CRPII. [id number] [place], ACSM 9:59 AM 03/11/2015

## 2015-03-11 NOTE — Consult Note (Signed)
Reason for Consult: Continuity of ESRD care Referring Physician: Sherren Mocha M.D. (cardiology)  HPI:  70 year old Caucasian man with past medical history significant for coronary artery disease status post CABG in 2001, ischemic cardiomyopathy (LVEF 25%), diabetes mellitus, ESRD on hemodialysis (Tuesday/Thursday/Saturday at [place], [place]) who presented with hypertensive urgency/chest pain. Underwent coronary angiogram 2 days ago that shows severe native 3 vessel CAD with patent LIMA. Medical management recommended.  When seen, he was ambulating the hallways and reportedly feeling well without any active chest pain or shortness of breath. Denies any nausea, vomiting or diarrhea.  Past Medical History  Diagnosis Date  . Hypertensive heart disease   . Hypercholesterolemia   . Prostate cancer ([place])   . Hepatosplenomegaly 2015    fibrosis, no cirrhosis on 06/2013 liver biopsy  . Thrombocytopenia ([place]) 2015  . Depression with anxiety   . IDDM (insulin dependent diabetes mellitus) ([place])     INSULIN DEPENDENT  . GERD (gastroesophageal reflux disease)   . History of shingles   . Ischemic cardiomyopathy     a. echo 08/06/2014: EF 25-30%, multiple WMA, mod DD, PASP 49 mm Hg;  b. 12/2014 Echo: EF 30-35%, diff HK, mild MR.  . CKD (chronic kidney disease), stage IV ([place])     a. previously on dialysis 11/2013->04/2014;  b. 12/2014 Dialysis resumed.  . Coronary artery disease, occlusive     a. 2001 MI s/p 4v CABG (LIMA-LAD, SVG-D2, SVG-OM1, SVG-right PDA, SVG-right PL1); b. cath 07/30/2014 vein grafts all down, patent LIMA to LAD, significant LM and ost LCx dx; c. 08/07/2014 PCI Synergy DES 2.5 mm x 18 mm to dist LM and ost LCx, rec lifelong DAPT.  [name] Chronic combined systolic and diastolic CHF, NYHA class 2 ([place])     a. 08/2013 Echo: EF 40-45%, impaired relaxation;  b. 07/2014 Echo: EF 25-30%;  c. 12/2014 Echo: EF 30-35%, diff HK, mild MR.  . Gallstone pancreatitis 2015  . ESRD (end stage renal [facility])      Past Surgical History  Procedure Laterality Date  . Cardiac surgery    . Shoulder arthroscopy    . Penile prosthesis implant    . Myocardial infarction    . Cardiac catheterization    . Coronary angioplasty    . Coronary artery bypass graft  2001  . Colonoscopy    . Liver biopsy    . Av fistula placement Left 2015  . Cardiac catheterization N/A 07/30/2014    Procedure: Left Heart Cath and Cors/Grafts Angiography;  Surgeon: Dionisio David, MD;  Location: [facility];  Service: Cardiovascular;  Laterality: N/A;  . Cardiac catheterization N/A 08/07/2014    Procedure: Coronary Stent Intervention;  Surgeon: Lorretta Harp, MD;  Location: [facility];  Service: Cardiovascular;  Laterality: N/A;  . Cardiac catheterization N/A 03/09/2015    Procedure: Left Heart Cath and Coronary Angiography;  Surgeon: Sherren Mocha, MD;  Location: [facility];  Service: Cardiovascular;  Laterality: N/A;    Family History  Problem Relation Age of Onset  . Acute myelogenous leukemia Brother   . CAD Brother   . Diabetes Mellitus II Brother   . Heart disease Father 28    CABG    Social History:  reports that he quit smoking about 7 years ago. He has quit using smokeless tobacco. He reports that he does not drink alcohol or use illicit drugs.  Allergies:  Allergies  Allergen Reactions  . Sulfa Antibiotics Itching    hives  .  Morphine And Related     Makes patient feel weird    Medications:  Scheduled: . aspirin EC  81 mg Oral Daily  . carvedilol  6.25 mg Oral BID WC  . ferrous sulfate  325 mg Oral Q breakfast  . insulin aspart  0-15 Units Subcutaneous TID WC  . insulin aspart  0-5 Units Subcutaneous QHS  . insulin glargine  10 Units Subcutaneous QHS  . isosorbide mononitrate  60 mg Oral Daily  . pregabalin  75 mg Oral Daily  . ranolazine  500 mg Oral BID  . rosuvastatin  20 mg Oral QHS  . sodium chloride flush  3 mL Intravenous Q12H  . sodium chloride flush  3 mL  Intravenous Q12H  . ticagrelor  90 mg Oral BID    BMP Latest Ref Rng 03/09/2015 03/09/2015 02/27/2015  Glucose 65 - 99 mg/dL 501(H) 495(H) 439(H)  BUN 6 - 20 mg/dL 41(H) 42(H) 64(H)  Creatinine 0.61 - 1.24 mg/dL 5.10(H) 5.12(H) 5.86(H)  BUN/Creat Ratio 10 - 22 - - -  Sodium 135 - 145 mmol/L 138 137 133(L)  Potassium 3.5 - 5.1 mmol/L 4.3 4.3 4.3  Chloride 101 - 111 mmol/L 100(L) 101 93(L)  CO2 22 - 32 mmol/L - 22 27  Calcium 8.9 - 10.3 mg/dL - 7.6(L) 8.3(L)    CBC Latest Ref Rng 03/11/2015 03/09/2015 03/09/2015  WBC 4.0 - 10.5 K/uL 5.1 - 5.2  Hemoglobin 13.0 - 17.0 g/dL 8.7(L) 9.2(L) 8.9(L)  Hematocrit 39.0 - 52.0 % 25.2(L) 27.0(L) 26.3(L)  Platelets 150 - 400 K/uL 79(L) - 92(L)     No results found.  Review of Systems  Constitutional: Negative.   HENT: Negative for congestion and nosebleeds.   Eyes: Negative.   Respiratory: Positive for cough. Negative for sputum production, shortness of breath and stridor.   Cardiovascular: Positive for chest pain. Negative for palpitations, orthopnea, claudication and leg swelling.  Gastrointestinal: Negative.   Genitourinary: Negative.   Musculoskeletal: Negative.   Skin: Negative.   Neurological: Positive for headaches.  Endo/Heme/Allergies: Negative.    Blood pressure 136/72, pulse 66, temperature 97.8 F (36.6 C), temperature source Oral, resp. rate 17, height 5\' 10"  (1.778 m), weight 92.4 kg (203 lb 11.3 oz), SpO2 87 %. Physical Exam  Nursing note and vitals reviewed. Constitutional: He is oriented to person, place, and time. He appears well-developed and well-nourished. No distress.  HENT:  Head: Normocephalic and atraumatic.  Nose: Nose normal.  Mouth/Throat: Oropharynx is clear and moist.  Eyes: EOM are normal. Pupils are equal, round, and reactive to light. No scleral icterus.  Neck: Normal range of motion. Neck supple. No JVD present.  Cardiovascular: Normal rate and regular rhythm.   Murmur heard. 2/6 early systolic murmur   Respiratory: Effort normal and breath sounds normal. He has no wheezes. He has no rales.  GI: Soft. Bowel sounds are normal. There is no tenderness. There is no rebound.  Musculoskeletal: Normal range of motion. He exhibits no edema.  Chronic venous stasis changes over lower extremities. Left radiocephalic fistula  Neurological: He is alert and oriented to person, place, and time.  Skin: Skin is warm and dry. No erythema.    Assessment/Plan: 1. Coronary artery disease status post CABG with presentation for chest pain: Underwent serial cardiac enzymes that were negative for rising troponin levels and also had coronary angiogram that showed no targets for percutaneous intervention. Medical management currently being undertaken and plans in place for possible discharge with outpatient cardiology follow-up later today.  2. End-stage renal disease: We will order for hemodialysis today to be done prior to his departure from the hospital-appears that attempting to get him to outpatient dialysis may be logistically difficult today. 3. Anemia: Receiving ESA, no overt loss. 4. Hypertensive urgency: Appears to have been controlled with resumption of antihypertensive therapy/management of chest pain.  Falisa Lamora K. 03/11/2015, 10:24 AM

## 2015-03-11 NOTE — Progress Notes (Signed)
Subjective:  Troponin trended up yesterday from 0.16>17>24.5. Patient completely asymptomatic. He was started on a heparin gtt. He is not a candidate for any PCI due to his severe disease based on cath on 1/31.  Today, he reports he is feeling well. He denies any chest pain or SOB. He states he already missed his HD chair at 6:30 AM at his dialysis center, but was told he could be fit into a later time if he was going to go home today.    Objective:  Vital Signs in the last 24 hours: Temp:  [97.4 F (36.3 C)-98.6 F (37 C)] 98.6 F (37 C) (02/02 0405) Pulse Rate:  [56-92] 66 (02/02 0405) Resp:  [11-27] 21 (02/02 0405) BP: (102-160)/(55-98) 123/68 mmHg (02/02 0405) SpO2:  [87 %-100 %] 87 % (02/01 2230)  Intake/Output from previous day: 02/01 0701 - 02/02 0700 In: 976.1 [P.O.:660; I.V.:316.1] Out: 200 [Urine:200]  Physical Exam: General: resting in bed, NAD HEENT: [place]/AT, EOMI, sclera anicteric, mucus membranes moist Lungs: CTA bilaterally CV: RRR, 2/6 diastolic murmur heard best at LUSB Abd: BS+, soft, non-tender Ext: trace peripheral edema bilaterally, left forearm AV fistula with good thrill Skin: warm/dry no rash   Lab Results:  Recent Labs  03/09/15 2323 03/09/15 2330 03/11/15 0300  WBC 5.2  --  5.1  HGB 8.9* 9.2* 8.7*  PLT 92*  --  79*    Recent Labs  03/09/15 2323 03/09/15 2330  NA 137 138  K 4.3 4.3  CL 101 100*  CO2 22  --   GLUCOSE 495* 501*  BUN 42* 41*  CREATININE 5.12* 5.10*    Recent Labs  03/10/15 0823 03/10/15 1225  TROPONINI 17.19* 24.48*    Cardiac Studies: Left Heart Cath/Coronary Angio 03/09/15:  Prox RCA lesion, 100% stenosed.  Ost LAD lesion, 100% stenosed.  LIMA .  Widely patent LIMA. The graft inserts in the mid-LAD and supplies the proximal LAD and diagonals in retrograde fashion. The right PDA is also collateralized from septals of the LAD  Ost Cx lesion, 60% stenosed. The lesion was previously treated with a  stent (unknown type).  There is severe left ventricular systolic dysfunction.  1. Severe native 3 vessel CAD with total occlusion of the LAD and RCA, and moderate in-stent restenosis in the distal left main stent extending into the proximal circumflex.  2. Severe segmental LV dysfunction with severely elevated LVEDP  Suspect combination of hypertensive emergency, uncontrolled diabetes, and diffuse ischemia. In addition, question whether AV fistula may have high-flow and contribute to ischemia with a LIMA graft (pt with a loud diastolic murmur across the precordium). Recommend medical treatment with NTG, heparin, beta-blockade, etc. Discussed findings with family at length.   Assessment/Plan:  70yo man w/ hx of MI s/p CABG 2001 (LIMA to LAD patent and SVG x 3 all occluded), ESRD, ischemic cardiomyopathy (LVEF 25%), and diabetes presenting with chest pain and hypertensive urgency.  Hx CAD s/p CABG 2001 p/w Chest Pain: Cardiac cath on 1/31 revealed severe native 3 vessel CAD with total occlusion of the LAD and RCA and moderate instent restenosis in the distal left main stent extending to the proximal circumflex. This is essentially unchanged from his last cardiac cath in June 2016. He is being managed with medical treatment. He has significant risk factors including prior CAD, HTN, diabetes, and hyperlipidemia. Concern for medication non-compliance as well.  - Continue Brilinta and ASA - Continue Coreg 6.25 mg BID - Continue Crestor 20 mg daily,  lipid panel with LDL 28 - Continue Ranexa 1000 mg QHS due to his ESRD - Continue optimization of risk factors  - Can restart Imdur now off nitro gtt  Hypertensive Urgency: Resolved. BPs now in [id number] systolic on Coreg. Nitro gtt off yesterday.  - Continue Coreg  Chronic Combined Systolic and Diastolic HF: Last echo in Nov 2016 showed EF 30-35% with diffuse hypokinesis. He does not appear volume overloaded on exam. Will get repeat echo to evaluate  his LV function given this acute event. - f/u repeat echo results- not performed yesterday  ESRD on HD: Last HD on 1/31 prior to hospitalization. He is on a T/Th/Sat schedule. Will consult renal today for HD as would like to make sure he is able to tolerate it without chest pain or drop in BP.   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 He takes Lantus 10 units QHS at home. He is also on Lyrica 75 mg daily for his peripheral neuropathy.  - Continue Lantus 10 units QHS - Continue moderate ISS - A1c 9.1. He will need close follow up with his PCP and likely mealtime insulin in addition to lantus   Attending note to follow.   Albin Felling, MD, MPH Internal Medicine Resident, PGY-II Pager: [phone] 03/11/2015, 7:36 AM

## 2015-03-11 NOTE — Progress Notes (Signed)
ANTICOAGULATION CONSULT NOTE - Follow Up Consult  Pharmacy Consult for heparin Indication: CAD   Labs:  Recent Labs  03/09/15 2323 03/09/15 2330 03/10/15 0115 03/10/15 0823 03/10/15 1225 03/10/15 1925 03/11/15 0300  HGB 8.9* 9.2*  --   --   --   --  8.7*  HCT 26.3* 27.0*  --   --   --   --  25.2*  PLT 92*  --   --   --   --   --  79*  APTT 31  --   --   --   --   --   --   LABPROT 14.8  --   --   --   --   --   --   INR 1.15  --   --   --   --   --   --   HEPARINUNFRC  --   --   --   --   --  0.23* 0.29*  CREATININE 5.12* 5.10*  --   --   --   --   --   TROPONINI  --   --  0.16* 17.19* 24.48*  --   --      Assessment: 70yo male remains slightly subtherapeutic on heparin after rate increase.  Goal of Therapy:  Heparin level 0.3-0.7 units/ml   Plan:  Will increase heparin gtt by 1 unit/kg/hr to 1750 units/hr and check level in [place], PharmD, BCPS  03/11/2015,3:37 AM

## 2015-03-11 NOTE — Discharge Summary (Signed)
Discharge Summary    Patient ID: Joseph [name],  MRN: [id number], DOB/AGE: [id number] 68 y.o.  Admit date: 03/09/2015 Discharge date: 03/11/2015  Primary Care Provider: Marden Noble Primary Cardiologist: Dr. Ellyn Hack  Discharge Diagnoses    Active Problems:   Hx of CABG 2001   Type 2 diabetes mellitus with renal manifestations ([place])   Ischemic cardiomyopathy   Essential hypertension   CAD s/p CABG 2001, prior POBA, PCI/DES July 2016   Stable angina ([facility])   Acute MI, inferior wall, initial episode of care [facility])   Hypertensive urgency   Chronic combined systolic and diastolic heart failure ([place])   Hypertensive heart disease with heart failure ([place])   Hyperlipidemia   ESRD (end stage renal disease) (H[facility])   Allergies Allergies  Allergen Reactions  . Sulfa Antibiotics Itching    hives  . Morphine And Related     Makes patient feel weird    Diagnostic Studies/Procedures    Procedures    Left Heart Cath and Coronary Angiography    Conclusion     Prox RCA lesion, 100% stenosed.  Ost LAD lesion, 100% stenosed.  LIMA .  Widely patent LIMA. The graft inserts in the mid-LAD and supplies the proximal LAD and diagonals in retrograde fashion. The right PDA is also collateralized from septals of the LAD  Ost Cx lesion, 60% stenosed. The lesion was previously treated with a stent (unknown type).  There is severe left ventricular systolic dysfunction.  1. Severe native 3 vessel CAD with total occlusion of the LAD and RCA, and moderate in-stent restenosis in the distal left main stent extending into the proximal circumflex.  2. Severe segmental LV dysfunction with severely elevated LVEDP      History of Present Illness      This is a 70 y.o.white male with known history of MI s/p CABG 2001 (LIMA to LAD patent and SVG x 3 all occluded), ESRD on HD tues, thur, GERD, depression, HLD, HTN, ischemic cardiomyopathy (LVEF 25%) who presented with chest pain 03/09/15.  Chest pain started at 9 pm night of arrival when he was at home. He took NTG. This chest pain was similar to his prior MI. The chest pain was substernal in location, did not radiate, 10/10 in intensity, did not change with position, deep breathing or movement. Patient had some SOB associated with it. Patient also had very high blood pressure and elevated glucose. EMS was called and patient was found to have anterior STEMI on EKG. He was transported to [facility] for STEMI + hypertensive emergency.    Hospital Course     On arrival to [facility], patient was taken to cath lab for emergent cardiac catheterization. The procedure was performed by Dr. Burt Knack. He was found to have  severe native 3 vessel CAD with total occlusion of the LAD and RCA, and moderate in-stent restenosis in the distal left main stent extending into the proximal circumflex. His LIMA was widely patent. The graft inserts in the mid-LAD and supplies the proximal LAD and diagonals in retrograde fashion. The right PDA is also collateralized from septals of the LAD. The Ost Cx lesion had 60% stenosis. The lesion was previously treated with a stent (unknown type). There was also severe segmental LV dysfunction with severely elevated LVEDP.  Findings were essentially unchanged from his last cardiac cath in June 2016. It was also felt that hypertensive emergency was perhaps responsible for his CP.  Medical therapy was recommended. He was transitioned  off of IV nitro and continued on his home dose of Imdur, along with Coreg, ASA, Brilinta, Crestor and Ranexa. His coreg was titrated upwards and his BP was better controlled. Neprhology assisted with HD. Prior to discharge, he underwent HD w/o any issues. He had no recurrent CP or dyspnea ambulating. 2D echo was obtained prior to discharge (results pending). Since he was ambulating w/o difficulty and BP was well controlled, Dr. Oval Linsey felt that he was stable for discharge home. F/u has been arranged with Christell Faith, PA-C, in 1 week on 03/18/15.     Consultants: nephrology    _____________  Discharge Vitals Blood pressure 119/108, pulse 65, temperature 98 F (36.7 C), temperature source Oral, resp. rate 20, height 5\' 10"  (1.778 m), weight 199 lb 11.8 oz (90.6 kg), SpO2 100 %.  Filed Weights   03/10/15 0050 03/11/15 1017 03/11/15 1447  Weight: 203 lb 11.3 oz (92.4 kg) 205 lb 11 oz (93.3 kg) 199 lb 11.8 oz (90.6 kg)    Labs & Radiologic Studies     CBC  Recent Labs  03/09/15 2323 03/09/15 2330 03/11/15 0300  WBC 5.2  --  5.1  NEUTROABS 4.0  --   --   HGB 8.9* 9.2* 8.7*  HCT 26.3* 27.0* 25.2*  MCV 90.4  --  89.0  PLT 92*  --  79*   Basic Metabolic Panel  Recent Labs  03/09/15 2323 03/09/15 2330 03/11/15 1117  NA 137 138 139  K 4.3 4.3 5.0  CL 101 100* 102  CO2 22  --  21*  GLUCOSE 495* 501* 251*  BUN 42* 41* 51*  CREATININE 5.12* 5.10* 6.22*  CALCIUM 7.6*  --  7.9*  PHOS  --   --  6.8*   Liver Function Tests  Recent Labs  03/11/15 1117  ALBUMIN 3.0*   No results for input(s): LIPASE, AMYLASE in the last 72 hours. Cardiac Enzymes  Recent Labs  03/10/15 0115 03/10/15 0823 03/10/15 1225  TROPONINI 0.16* 17.19* 24.48*   BNP Invalid input(s): POCBNP D-Dimer No results for input(s): DDIMER in the last 72 hours. Hemoglobin A1C  Recent Labs  03/10/15 0115  HGBA1C 9.1*   Fasting Lipid Panel  Recent Labs  03/10/15 0823  CHOL 77  HDL 23*  LDLCALC 28  TRIG 130  CHOLHDL 3.3   Thyroid Function Tests No results for input(s): TSH, T4TOTAL, T3FREE, THYROIDAB in the last 72 hours.  Invalid input(s): FREET3  No results found.  Disposition   Pt is being discharged home today in good condition.  Follow-up Plans & Appointments    Follow-up Information    Follow up with DUNN,RYAN, PA-C On 03/18/2015.   Specialties:  Physician Assistant, Cardiology, Radiology   Why:  10:30 am (Dr. Allison Quarry PA)   Contact information:   [place] [address] [phone]      Discharge Instructions    Amb Referral to Cardiac Rehabilitation    Complete by:  As directed   Diagnosis:  Myocardial Infarction     Diet - low sodium heart healthy    Complete by:  As directed      Increase activity slowly    Complete by:  As directed            Discharge Medications   Current Discharge Medication List    CONTINUE these medications which have CHANGED   Details  carvedilol (COREG) 6.25 MG tablet Take 1 tablet (6.25 mg total) by mouth  2 (two) times daily with a meal.      CONTINUE these medications which have NOT CHANGED   Details  ALPRAZolam (XANAX) 0.5 MG tablet Take 0.5 mg by mouth as needed for anxiety.    aspirin EC 81 MG tablet Take 81 mg by mouth daily.    insulin aspart protamine- aspart (NOVOLOG MIX 70/30) (70-30) 100 UNIT/ML injection Inject 25 Units into the skin daily with breakfast.    insulin glargine (LANTUS) 100 UNIT/ML injection Inject 0.1 mLs (10 Units total) into the skin at bedtime. Qty: 10 mL, Refills: 11    isosorbide mononitrate (IMDUR) 60 MG 24 hr tablet Take 1.5 tablets (90 mg total) by mouth daily. Qty: 135 tablet, Refills: 3    lidocaine-prilocaine (EMLA) cream Apply 1 application topically as needed.  Refills: 1    nitroGLYCERIN (NITROLINGUAL) 0.4 MG/SPRAY spray Place 1 spray under the tongue every 5 (five) minutes x 3 doses as needed for chest pain. Do not use together with sublingual nitro Qty: 12 g, Refills: 5    pregabalin (LYRICA) 75 MG capsule Take 1 capsule (75 mg total) by mouth daily.    ranolazine (RANEXA) 500 MG 12 hr tablet Take 1 tablet (500 mg total) by mouth 2 (two) times daily. Qty: 180 tablet, Refills: 1    rosuvastatin (CRESTOR) 20 MG tablet Take 1 tablet (20 mg total) by mouth at bedtime. Qty: 90 tablet, Refills: 1    ticagrelor (BRILINTA) 90 MG TABS tablet Take 1 tablet (90 mg total) by mouth 2 (two) times daily. Qty: 60 tablet, Refills: 6      STOP  taking these medications     furosemide (LASIX) 40 MG tablet      ferrous sulfate 325 (65 FE) MG EC tablet      oxyCODONE-acetaminophen (PERCOCET/ROXICET) 5-325 MG tablet          Aspirin prescribed at discharge?  Yes High Intensity Statin Prescribed? (Lipitor 40-80mg  or Crestor 20-40mg ): Yes Beta Blocker Prescribed? Yes For EF 45% or less, Was ACEI/ARB Prescribed? No: CKD ADP Receptor Inhibitor Prescribed? (i.e. Plavix etc.-Includes Medically Managed Patients): Yes For EF <40%, Aldosterone Inhibitor Prescribed? No:  Was EF assessed during THIS hospitalization? Yes Was Cardiac Rehab II ordered? (Included Medically managed Patients): No:    Outstanding Labs/Studies   None   Duration of Discharge Encounter   Greater than 30 minutes including physician time.  Alveta Heimlich, [place]oya Hogsett NP 03/11/2015, 4:44 PM

## 2015-03-18 ENCOUNTER — Encounter: Payer: Self-pay | Admitting: Physician Assistant

## 2015-03-18 ENCOUNTER — Ambulatory Visit: Payer: Medicare Other | Admitting: Physician Assistant

## 2015-03-18 ENCOUNTER — Ambulatory Visit (INDEPENDENT_AMBULATORY_CARE_PROVIDER_SITE_OTHER): Payer: Medicare Other | Admitting: Physician Assistant

## 2015-03-18 VITALS — BP 140/64 | HR 61 | Ht 71.0 in | Wt 202.2 lb

## 2015-03-18 DIAGNOSIS — I2510 Atherosclerotic heart disease of native coronary artery without angina pectoris: Secondary | ICD-10-CM | POA: Diagnosis not present

## 2015-03-18 DIAGNOSIS — E785 Hyperlipidemia, unspecified: Secondary | ICD-10-CM

## 2015-03-18 DIAGNOSIS — I255 Ischemic cardiomyopathy: Secondary | ICD-10-CM

## 2015-03-18 DIAGNOSIS — I110 Hypertensive heart disease with heart failure: Secondary | ICD-10-CM | POA: Diagnosis not present

## 2015-03-18 DIAGNOSIS — I10 Essential (primary) hypertension: Secondary | ICD-10-CM | POA: Diagnosis not present

## 2015-03-18 DIAGNOSIS — Z9861 Coronary angioplasty status: Secondary | ICD-10-CM | POA: Diagnosis not present

## 2015-03-18 DIAGNOSIS — N186 End stage renal disease: Secondary | ICD-10-CM

## 2015-03-18 DIAGNOSIS — Z992 Dependence on renal dialysis: Secondary | ICD-10-CM

## 2015-03-18 DIAGNOSIS — D638 Anemia in other chronic diseases classified elsewhere: Secondary | ICD-10-CM

## 2015-03-18 NOTE — Progress Notes (Signed)
Cardiology Office Note Date:  03/18/2015  Patient ID:  Joseph [name], Joseph [name] [birth year], MRN [id number] PCP:  Joseph Noble, MD  Cardiologist:  Dr. Ellyn Hack, MD    Chief Complaint: Hospital follow up   History of Present Illness: Joseph [name] is a 70 y.o. male with history of CAD s/p 4 vessel CABG in the setting of MI in 2001 (LIMA to LAD patent and SVG x 3 all occluded), ischemic cardiomyopathy with EF 30-35%, ESRD on HD Tues,Thurs, Sat (though patient only goes Tues and Thurs as he reports he feels too well to go on Sat and plays [employer] instead, he does still make good urine), hypertensive heart disease, HLD, anemia of chronic disease, IDDM, history of prostate cancer, and depression who presents for hospital follow up of recent admission to [facility] for anterior ST elevation MI in the setting of hypertensive emergency.   He has a history of coronary artery disease dating back to 2001, at which time he suffered a myocardial infarction and subsequently underwent coronary artery bypass grafting. More recently, he underwent catheterization in June 2016 revealing severe left main and circumflex disease with a patent LIMA to the LAD. All of his vein grafts were occluded. He subsequently underwent successful PCI and stenting to the left main and left circumflex in July 2016. He also has a history of an ischemic cardiomyopathy and congestive heart failure. Most recent EF was 30-35% by echocardiogram in November 2016. He was also hospitalized in November 2016 with pneumonia and anemia requiring blood transfusion. At that time, he was also noted to have worsening of his renal failure and dialysis was resumed. Since then, he has remained on dialysis. He says his nephrologist has recommended a 3 day a week schedule however he is only been doing 2 days a week. On the third day, he typically will take a when necessary Lasix and overall his volume has been well controlled. He was seen on 02/25/15 and doing well  at that time. He subsequently presented to [facility] with an anterior ST elevation MI in the setting of hypertensive emergency on 1/31 with BP of 182/93. He was taken emergently to the cath lab which showed severe native 3 vessel CAD with total occlusion of the LAD and RCA, and moderate ISR in the distal LM stent extending into the proximal LCx. His LIMA was widely patent. The graft inserts in the mid-LAD and supplies the proximal LAD and diagonals in retrograde fashion. The right PDA is also collateralized from septals of the LAD. The Ost Cx lesion had 60% stenosis. The lesion was previously treated with a stent (unknown type). There was also severe segmental LV dysfunction with severely elevated LVEDP. Findings were essentially unchanged from his last cardiac cath in June 2016. It was also felt that hypertensive emergency was perhaps responsible for his CP.Medical therapy was recommended. BP improved at discharge.   Since his discharge he has felt much better. He has had no further issues of chest pain or poorly controlled blood pressure. He does continue to undergo HD twice weekly rather than three times weekly. He plans to restart [place] within the next couple of weeks now that his left knee is not bothering him as much. He is tolerating his medications without issues.      Past Medical History  Diagnosis Date  . Hypertensive heart disease   . Hypercholesterolemia   . Prostate cancer ([place])   . Hepatosplenomegaly 2015    fibrosis, no cirrhosis  on 06/2013 liver biopsy  . Thrombocytopenia ([place]) 2015  . Depression with anxiety   . IDDM (insulin dependent diabetes mellitus) ([place])     INSULIN DEPENDENT  . GERD (gastroesophageal reflux disease)   . History of shingles   . Ischemic cardiomyopathy     a. echo 08/06/2014: EF 25-30%, multiple WMA, mod DD, PASP 49 mm Hg;  b. 12/2014 Echo: EF 30-35%, diff HK, mild MR.  . CKD (chronic kidney disease), stage IV ([place])     a. previously on dialysis  11/2013->04/2014;  b. 12/2014 Dialysis resumed.  . Coronary artery disease, occlusive     a. 2001 MI s/p 4v CABG (LIMA-LAD, SVG-D2, SVG-OM1, SVG-right PDA, SVG-right PL1); b. cath 07/30/2014 vein grafts all down, patent LIMA to LAD, significant LM and ost LCx dx; c. 08/07/2014 PCI Synergy DES 2.5 mm x 18 mm to dist LM and ost LCx, rec lifelong DAPT.  [name] Chronic combined systolic and diastolic CHF, NYHA class 2 ([place])     a. 08/2013 Echo: EF 40-45%, impaired relaxation;  b. 07/2014 Echo: EF 25-30%;  c. 12/2014 Echo: EF 30-35%, diff HK, mild MR.  . Gallstone pancreatitis 2015  . ESRD (end stage renal disease) [facility])     Past Surgical History  Procedure Laterality Date  . Cardiac surgery    . Shoulder arthroscopy    . Penile prosthesis implant    . Myocardial infarction    . Cardiac catheterization    . Coronary angioplasty    . Coronary artery bypass graft  2001  . Colonoscopy    . Liver biopsy    . Av fistula placement Left 2015  . Cardiac catheterization N/A 07/30/2014    Procedure: Left Heart Cath and Cors/Grafts Angiography;  Surgeon: Joseph David, MD;  Location: [place] CV LAB;  Service: Cardiovascular;  Laterality: N/A;  . Cardiac catheterization N/A 08/07/2014    Procedure: Coronary Stent Intervention;  Surgeon: Joseph Harp, MD;  Location: [place] CV LAB;  Service: Cardiovascular;  Laterality: N/A;  . Cardiac catheterization N/A 03/09/2015    Procedure: Left Heart Cath and Coronary Angiography;  Surgeon: Joseph Mocha, MD;  Location: [place] CV LAB;  Service: Cardiovascular;  Laterality: N/A;    Current Outpatient Prescriptions  Medication Sig Dispense Refill  . ALPRAZolam (XANAX) 0.5 MG tablet Take 0.5 mg by mouth as needed for anxiety.    [name] aspirin EC 81 MG tablet Take 81 mg by mouth daily.    . carvedilol (COREG) 6.25 MG tablet Take 1 tablet (6.25 mg total) by mouth 2 (two) times daily with a meal.    . insulin aspart protamine- aspart (NOVOLOG MIX 70/30) (70-30)  100 UNIT/ML injection Inject 25 Units into the skin daily with breakfast.    . insulin glargine (LANTUS) 100 UNIT/ML injection Inject 0.1 mLs (10 Units total) into the skin at bedtime. (Patient taking differently: Inject 20 Units into the skin at bedtime. ) 10 mL 11  . isosorbide mononitrate (IMDUR) 60 MG 24 hr tablet Take 1.5 tablets (90 mg total) by mouth daily. 135 tablet 3  . lidocaine-prilocaine (EMLA) cream Apply 1 application topically as needed.   1  . nitroGLYCERIN (NITROLINGUAL) 0.4 MG/SPRAY spray Place 1 spray under the tongue every 5 (five) minutes x 3 doses as needed for chest pain. Do not use together with sublingual nitro 12 g 5  . pregabalin (LYRICA) 75 MG capsule Take 1 capsule (75 mg total) by mouth daily. (Patient taking differently: Take 75 mg  by mouth 2 (two) times daily. )    . ranolazine (RANEXA) 500 MG 12 hr tablet Take 1 tablet (500 mg total) by mouth 2 (two) times daily. 180 tablet 1  . rosuvastatin (CRESTOR) 20 MG tablet Take 1 tablet (20 mg total) by mouth at bedtime. 90 tablet 1  . ticagrelor (BRILINTA) 90 MG TABS tablet Take 1 tablet (90 mg total) by mouth 2 (two) times daily. 60 tablet 6   No current facility-administered medications for this visit.    Allergies:   Sulfa antibiotics and Morphine and related   Social History:  The patient  reports that he quit smoking about 7 years ago. He has quit using smokeless tobacco. He reports that he does not drink alcohol or use illicit drugs.   Family History:  The patient's family history includes Acute myelogenous leukemia in his brother; CAD in his brother; Diabetes Mellitus II in his brother; Heart disease (age of onset: 31) in his father.  ROS:   Review of Systems  Constitutional: Negative for fever, chills, weight loss, malaise/fatigue and diaphoresis.  HENT: Negative for congestion.   Eyes: Negative for discharge and redness.  Respiratory: Negative for cough, hemoptysis, sputum production, shortness of breath and  wheezing.   Cardiovascular: Negative for chest pain, palpitations, orthopnea, claudication, leg swelling and PND.  Gastrointestinal: Negative for heartburn, nausea, vomiting and abdominal pain.  Musculoskeletal: Positive for joint pain. Negative for myalgias, back pain, falls and neck pain.       Left knee pain - much improved  Skin: Negative for rash.  Neurological: Negative for dizziness, tingling, tremors, sensory change, speech change, focal weakness, loss of consciousness and weakness.  Endo/Heme/Allergies: Does not bruise/bleed easily.  Psychiatric/Behavioral: Negative for substance abuse. The patient is not nervous/anxious.   All other systems reviewed and are negative.     PHYSICAL EXAM:  VS:  BP 140/64 mmHg  Pulse 61  Ht 5\' 11"  (1.803 m)  Wt 202 lb 4 oz (91.74 kg)  BMI 28.22 kg/m2 BMI: Body mass index is 28.22 kg/(m^2). Well nourished, well developed, in no acute distress HEENT: normocephalic, atraumatic Neck: no JVD, carotid bruits or masses Cardiac:  normal S1, S2; RRR; no murmurs, rubs, or gallops Lungs:  clear to auscultation bilaterally, no wheezing, rhonchi or rales Abd: soft, nontender, no hepatomegaly, + BS MS: no deformity or atrophy Ext: no edema Skin: warm and dry, no rash Neuro:  moves all extremities spontaneously, no focal abnormalities noted, follows commands Psych: euthymic mood, full affect   EKG:  Was ordered today. Shows NSR, nonspecific QRS widening, nonspecific inferior st/t changes, TWI I, aVL, V2, nonspecific st/t changes V5-V6  Recent Labs: 08/26/2014: Magnesium 1.7 11/04/2014: B Natriuretic Peptide 2287.4*; TSH 2.026 12/11/2014: ALT 14* 03/11/2015: BUN 51*; Creatinine, Ser 6.22*; Hemoglobin 8.7*; Platelets 79*; Potassium 5.0; Sodium 139  03/10/2015: Cholesterol 77; HDL 23*; LDL Cholesterol 28; Total CHOL/HDL Ratio 3.3; Triglycerides 130; VLDL 26   Estimated Creatinine Clearance: 13.4 mL/min (by C-G formula based on Cr of 6.22).   Wt Readings from  Last 3 Encounters:  03/18/15 202 lb 4 oz (91.74 kg)  03/11/15 199 lb 11.8 oz (90.6 kg)  02/27/15 200 lb (90.719 kg)     Other studies reviewed: Additional studies/records reviewed today include: summarized above  ASSESSMENT AND PLAN:  1. CAD s/p CABG and PCI as above: No further symptoms concerning for chest pain. Continue current medications including DAPT with aspirin 81 mg and Brilinta 90 mg bid. Continue Coreg 6.25 mg bid.  Ranexa 500 mg bid. Should he have further chest pain episodes would increase to 1000 mg bid. Imdur 90 mg daily. No further ischemic work up at this time.   2. Ischemic cardiomyopathy: He does not appear to be volume overloaded at this time. His volume is managed by dialysis. His EF has been 30-35% for some time now. I had another discussion with him regarding ICD placement and referral to EP for this. He has been on optimal medical therapy for him, given his set backs and comorbidities for many months without improvement. He does take his medications as directed per his report. He continues to decline at this time.   3. Hypertensive heart disease: Continue current regimen. Concern of soft BP during HD.   4. ESRD on HD: Per renal.   5. Anemia of chronic disease: Check CBC.   6. HLD: On Crestor.   Disposition: F/u with Dr. Ellyn Hack, MD in 3 months  Current medicines are reviewed at length with the patient today.  The patient did not have any concerns regarding medicines.  Melvern Banker PA-C 03/18/2015 5:45 PM     [address] [place], [place] [phone]

## 2015-03-18 NOTE — Patient Instructions (Signed)
Medication Instructions:  Your physician recommends that you continue on your current medications as directed. Please refer to the Current Medication list given to you today.   Labwork: CBC  Testing/Procedures: none  Follow-Up: Your physician recommends that you schedule a follow-up appointment in: 3 months with Dr. Ellyn Hack   Any Other Special Instructions Will Be Listed Below (If Applicable).     If you need a refill on your cardiac medications before your next appointment, please call your pharmacy.

## 2015-03-19 LAB — CBC
HEMATOCRIT: 25.8 % — AB (ref 37.5–51.0)
HEMOGLOBIN: 8.5 g/dL — AB (ref 12.6–17.7)
MCH: 29.2 pg (ref 26.6–33.0)
MCHC: 32.9 g/dL (ref 31.5–35.7)
MCV: 89 fL (ref 79–97)
Platelets: 99 10*3/uL — CL (ref 150–379)
RBC: 2.91 x10E6/uL — AB (ref 4.14–5.80)
RDW: 16.8 % — ABNORMAL HIGH (ref 12.3–15.4)
WBC: 4.9 10*3/uL (ref 3.4–10.8)

## 2015-04-26 ENCOUNTER — Ambulatory Visit (INDEPENDENT_AMBULATORY_CARE_PROVIDER_SITE_OTHER): Payer: Medicare Other

## 2015-04-26 ENCOUNTER — Other Ambulatory Visit: Payer: Self-pay

## 2015-04-26 DIAGNOSIS — I509 Heart failure, unspecified: Secondary | ICD-10-CM

## 2015-05-25 ENCOUNTER — Inpatient Hospital Stay (HOSPITAL_BASED_OUTPATIENT_CLINIC_OR_DEPARTMENT_OTHER): Payer: Medicare Other | Admitting: Family Medicine

## 2015-05-25 ENCOUNTER — Inpatient Hospital Stay: Payer: Medicare Other | Attending: Family Medicine

## 2015-05-25 ENCOUNTER — Inpatient Hospital Stay: Payer: Medicare Other | Admitting: Family Medicine

## 2015-05-25 ENCOUNTER — Inpatient Hospital Stay: Payer: Medicare Other

## 2015-05-25 VITALS — BP 118/66 | HR 69 | Temp 98.5°F | Wt 201.7 lb

## 2015-05-25 DIAGNOSIS — Z7982 Long term (current) use of aspirin: Secondary | ICD-10-CM | POA: Insufficient documentation

## 2015-05-25 DIAGNOSIS — Z87891 Personal history of nicotine dependence: Secondary | ICD-10-CM | POA: Diagnosis not present

## 2015-05-25 DIAGNOSIS — D696 Thrombocytopenia, unspecified: Secondary | ICD-10-CM | POA: Diagnosis not present

## 2015-05-25 DIAGNOSIS — K219 Gastro-esophageal reflux disease without esophagitis: Secondary | ICD-10-CM | POA: Insufficient documentation

## 2015-05-25 DIAGNOSIS — D472 Monoclonal gammopathy: Secondary | ICD-10-CM

## 2015-05-25 DIAGNOSIS — Z794 Long term (current) use of insulin: Secondary | ICD-10-CM | POA: Insufficient documentation

## 2015-05-25 DIAGNOSIS — F418 Other specified anxiety disorders: Secondary | ICD-10-CM | POA: Insufficient documentation

## 2015-05-25 DIAGNOSIS — Z79899 Other long term (current) drug therapy: Secondary | ICD-10-CM

## 2015-05-25 DIAGNOSIS — E7800 Pure hypercholesterolemia, unspecified: Secondary | ICD-10-CM | POA: Diagnosis not present

## 2015-05-25 DIAGNOSIS — E119 Type 2 diabetes mellitus without complications: Secondary | ICD-10-CM

## 2015-05-25 DIAGNOSIS — N184 Chronic kidney disease, stage 4 (severe): Secondary | ICD-10-CM | POA: Insufficient documentation

## 2015-05-25 DIAGNOSIS — Z992 Dependence on renal dialysis: Secondary | ICD-10-CM | POA: Insufficient documentation

## 2015-05-25 DIAGNOSIS — I255 Ischemic cardiomyopathy: Secondary | ICD-10-CM

## 2015-05-25 DIAGNOSIS — I5042 Chronic combined systolic (congestive) and diastolic (congestive) heart failure: Secondary | ICD-10-CM | POA: Diagnosis not present

## 2015-05-25 DIAGNOSIS — K769 Liver disease, unspecified: Secondary | ICD-10-CM

## 2015-05-25 DIAGNOSIS — Z8546 Personal history of malignant neoplasm of prostate: Secondary | ICD-10-CM | POA: Insufficient documentation

## 2015-05-25 DIAGNOSIS — Z806 Family history of leukemia: Secondary | ICD-10-CM

## 2015-05-25 DIAGNOSIS — I2510 Atherosclerotic heart disease of native coronary artery without angina pectoris: Secondary | ICD-10-CM | POA: Insufficient documentation

## 2015-05-25 DIAGNOSIS — R162 Hepatomegaly with splenomegaly, not elsewhere classified: Secondary | ICD-10-CM

## 2015-05-25 DIAGNOSIS — I130 Hypertensive heart and chronic kidney disease with heart failure and stage 1 through stage 4 chronic kidney disease, or unspecified chronic kidney disease: Secondary | ICD-10-CM | POA: Diagnosis not present

## 2015-05-25 LAB — CBC WITH DIFFERENTIAL/PLATELET
Basophils Absolute: 0 10*3/uL (ref 0–0.1)
Basophils Relative: 1 %
EOS PCT: 1 %
Eosinophils Absolute: 0 10*3/uL (ref 0–0.7)
HEMATOCRIT: 36 % — AB (ref 40.0–52.0)
Hemoglobin: 12.3 g/dL — ABNORMAL LOW (ref 13.0–18.0)
LYMPHS ABS: 0.7 10*3/uL — AB (ref 1.0–3.6)
LYMPHS PCT: 10 %
MCH: 31.5 pg (ref 26.0–34.0)
MCHC: 34.2 g/dL (ref 32.0–36.0)
MCV: 92.1 fL (ref 80.0–100.0)
MONO ABS: 0.4 10*3/uL (ref 0.2–1.0)
Monocytes Relative: 7 %
Neutro Abs: 5.3 10*3/uL (ref 1.4–6.5)
Neutrophils Relative %: 81 %
Platelets: 122 10*3/uL — ABNORMAL LOW (ref 150–440)
RBC: 3.91 MIL/uL — AB (ref 4.40–5.90)
RDW: 15.7 % — AB (ref 11.5–14.5)
WBC: 6.5 10*3/uL (ref 3.8–10.6)

## 2015-05-25 LAB — CREATININE, SERUM
Creatinine, Ser: 5.29 mg/dL — ABNORMAL HIGH (ref 0.61–1.24)
GFR calc non Af Amer: 10 mL/min — ABNORMAL LOW (ref >60)
GFR, EST AFRICAN AMERICAN: 12 mL/min — AB (ref >60)

## 2015-05-25 NOTE — Progress Notes (Signed)
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  Skeletal x-ray survey reports 2 subcentimeter nonspecific lesions in the left frontal bone, otherwise unremarkable.  Bone marrow biopsy on 11/07/12 reports low level atypical plasma cell infiltrate(~ 5-10%) with excess kappa light chain expression. Mildly hypercellular bone marrow for age ~ 50-60% with trilineage hematopoiesis and mildly increased megakaryocytes without significant atypia and no increase in blasts.  No significant increase in marrow reticulin fibers.  Storage iron is present.  Diagnostic features of hematopoietic neoplasia/dysplasia or a myeloproliferative neoplasm or not identified.  Cytogenetics unremarkable 46, XY.  Flow cytometry reports too few plasma cells to assess for clonality (<0.1%), blasts are not increased, nonspecific myeloid findings, no all been in lymphoid antigenic expression or monoclonality.   INTERVAL HISTORY: Patient is here for further follow-up regarding persistent thrombocytopenia as well as incidental MGUS findings. Patient is currently receiving hemodialysis treatments on Tuesdays and Thursdays and short session on Saturday. Patient does have a history of cirrhosis of the liver and is status post a liver biopsy at [facility] in the past. Thrombocytopenia makes likely secondary to chronic liver disease. He does currently reports feeling very well. He remains  active in playing pickle ball 3-4 times per week. He overall feels very well and denies any acute complaints.  REVIEW OF SYSTEMS:   Review of Systems  Constitutional: Negative for fever, chills, weight loss, malaise/fatigue and diaphoresis.  HENT: Negative.   Eyes: Negative.   Respiratory: Negative for cough, hemoptysis, sputum production, shortness of breath and wheezing.   Cardiovascular: Negative for chest pain, palpitations, orthopnea, claudication, leg swelling and PND.  Gastrointestinal: Negative for heartburn, nausea, vomiting, abdominal pain, diarrhea, constipation, blood in stool and melena.  Genitourinary: Negative.   Musculoskeletal: Negative.   Skin: Negative.   Neurological: Negative for dizziness, tingling, focal weakness, seizures and weakness.  Endo/Heme/Allergies: Does not bruise/bleed easily.  Psychiatric/Behavioral: Negative for depression. The patient is not nervous/anxious and does not have insomnia.     As per HPI. Otherwise, a complete review of systems is negatve.   PAST MEDICAL HISTORY: Past Medical History  Diagnosis Date  . Hypertensive heart disease   . Hypercholesterolemia   . Prostate cancer ([facility])   . Hepatosplenomegaly 2015    fibrosis, no cirrhosis on 06/2013 liver biopsy  . Thrombocytopenia ([facility]) 2015  . Depression with anxiety   . IDDM (insulin dependent diabetes mellitus) ([facility])     INSULIN DEPENDENT  . GERD (gastroesophageal reflux disease)   . History of shingles   . Ischemic cardiomyopathy     a. echo 08/06/2014: EF 25-30%, multiple WMA, mod DD, PASP 49 mm Hg;  b. 12/2014 Echo: EF 30-35%, diff HK, mild MR.  . CKD (chronic kidney disease), stage IV ([facility])     a. previously on dialysis 11/2013->04/2014;  b. 12/2014 Dialysis resumed.  . Coronary artery disease, occlusive     a. 2001 MI s/p 4v CABG (LIMA-LAD, SVG-D2, SVG-OM1, SVG-right PDA, SVG-right PL1); b. cath 07/30/2014 vein grafts all down, patent LIMA to LAD,   significant LM and ost LCx dx; c.  08/07/2014 PCI Synergy DES 2.5 mm x 18 mm to dist LM and ost LCx, rec lifelong DAPT.  . Chronic combined systolic and diastolic CHF, NYHA class 2 ([facility])     a. 08/2013 Echo: EF 40-45%, impaired relaxation;  b. 07/2014 Echo: EF 25-30%;  c. 12/2014 Echo: EF 30-35%, diff HK, mild MR.  . Gallstone pancreatitis 2015  . ESRD (end stage renal disease) ([facility])     PAST SURGICAL HISTORY: Past Surgical History  Procedure Laterality Date  . Cardiac surgery    . Shoulder arthroscopy    . Penile prosthesis implant    . Myocardial infarction    . Cardiac catheterization    . Coronary angioplasty    . Coronary artery bypass graft  2001  . Colonoscopy    . Liver biopsy    . Av fistula placement Left 2015  . Cardiac catheterization N/A 07/30/2014    Procedure: Left Heart Cath and Cors/Grafts Angiography;  Surgeon: Shaukat A Khan, MD;  Location: [facility] INVASIVE CV LAB;  Service: Cardiovascular;  Laterality: N/A;  . Cardiac catheterization N/A 08/07/2014    Procedure: Coronary Stent Intervention;  Surgeon: Jonathan J Berry, MD;  Location: [facility] INVASIVE CV LAB;  Service: Cardiovascular;  Laterality: N/A;  . Cardiac catheterization N/A 03/09/2015    Procedure: Left Heart Cath and Coronary Angiography;  Surgeon: Michael Cooper, MD;  Location: [facility] IN[facility] CV [facility];  Service: Cardiovascular;  Laterality: N/A;    FAMILY HISTORY Family History  Problem Relation Age of Onset  . Acute myelogenous leukemia Brother   . CAD Brother   . Diabetes Mellitus II Brother   . Heart disease Father 64    CABG    GYNECOLOGIC HISTORY:  No LMP for male patient.     ADVANCED DIRECTIVES:    HEALTH MAINTENANCE: Social History  Substance Use Topics  . Smoking status: Former Smoker    Quit date: 02/07/2008  . Smokeless tobacco: Former User  . Alcohol Use: No     Allergies  Allergen Reactions  . Sulfa Antibiotics Itching    hives  . Morphine And Related     Makes patient feel weird    Current Outpatient Prescriptions    Medication Sig Dispense Refill  . ALPRAZolam (XANAX) 0.5 MG tablet Take 0.5 mg by mouth as needed for anxiety.    . aspirin EC 81 MG tablet Take 81 mg by mouth daily.    . carvedilol (COREG) 6.25 MG tablet Take 1 tablet (6.25 mg total) by mouth 2 (two) times daily with a meal.    . insulin aspart protamine- aspart (NOVOLOG MIX 70/30) (70-30) 100 UNIT/ML injection Inject 25 Units into the skin daily with breakfast.    . insulin glargine (LANTUS) 100 UNIT/ML injection Inject 0.1 mLs (10 Units total) into the skin at bedtime. (Patient taking differently: Inject 20 Units into the skin at bedtime. ) 10 mL 11  . isosorbide mononitrate (IMDUR) 60 MG 24 hr tablet Take 1.5 tablets (90 mg total) by mouth daily. 135 tablet 3  . lidocaine-prilocaine (EMLA) cream Apply 1 application topically as needed.   1  . nitroGLYCERIN (NITROLINGUAL) 0.4 MG/SPRAY spray Place 1 spray under the tongue every 5 (five) minutes x 3 doses as needed for chest pain. Do not use together with sublingual nitro 12 g 5  . pregabalin (LYRICA) 75 MG capsule Take 1 capsule (75 mg total) by mouth daily. (Patient taking differently: Take 75 mg by mouth 2 (  two) times daily. )    . ranolazine (RANEXA) 500 MG 12 hr tablet Take 1 tablet (500 mg total) by mouth 2 (two) times daily. 180 tablet 1  . rosuvastatin (CRESTOR) 20 MG tablet Take 1 tablet (20 mg total) by mouth at bedtime. 90 tablet 1  . ticagrelor (BRILINTA) 90 MG TABS tablet Take 1 tablet (90 mg total) by mouth 2 (two) times daily. 60 tablet 6   No current facility-administered medications for this visit.    OBJECTIVE: There were no vitals taken for this visit.   There is no weight on file to calculate BMI.    ECOG FS:0 - Asymptomatic  General: Well-developed, well-nourished, no acute distress. Patient with a left AV fistula in the forearm. Eyes: Pink conjunctiva, anicteric sclera. HEENT: Normocephalic, moist mucous membranes, clear oropharnyx. Lungs: Clear to auscultation  bilaterally. Heart: Regular rate and rhythm. No rubs, murmurs, or gallops.  Abdomen: Soft, nontender, nondistended. No organomegaly noted, normoactive bowel sounds. Musculoskeletal: No edema, cyanosis, or clubbing. Neuro: Alert, answering all questions appropriately. Cranial nerves grossly intact. Skin: No rashes or petechiae noted. Psych: Normal affect. Lymphatics: No cervical, clavicular LAD.   LAB RESULTS:  Appointment on 05/25/2015  Component Date Value Ref Range Status  . WBC 05/25/2015 6.5  3.8 - 10.6 K/uL Final  . RBC 05/25/2015 3.91* 4.40 - 5.90 MIL/uL Final  . Hemoglobin 05/25/2015 12.3* 13.0 - 18.0 g/dL Final  . HCT 05/25/2015 36.0* 40.0 - 52.0 % Final  . MCV 05/25/2015 92.1  80.0 - 100.0 fL Final  . MCH 05/25/2015 31.5  26.0 - 34.0 pg Final  . MCHC 05/25/2015 34.2  32.0 - 36.0 g/dL Final  . RDW 05/25/2015 15.7* 11.5 - 14.5 % Final  . Platelets 05/25/2015 122* 150 - 440 K/uL Final  . Neutrophils Relative % 05/25/2015 81   Final  . Neutro Abs 05/25/2015 5.3  1.4 - 6.5 K/uL Final  . Lymphocytes Relative 05/25/2015 10   Final  . Lymphs Abs 05/25/2015 0.7* 1.0 - 3.6 K/uL Final  . Monocytes Relative 05/25/2015 7   Final  . Monocytes Absolute 05/25/2015 0.4  0.2 - 1.0 K/uL Final  . Eosinophils Relative 05/25/2015 1   Final  . Eosinophils Absolute 05/25/2015 0.0  0 - 0.7 K/uL Final  . Basophils Relative 05/25/2015 1   Final  . Basophils Absolute 05/25/2015 0.0  0 - 0.1 K/uL Final  . Creatinine, Ser 05/25/2015 5.29* 0.61 - 1.24 mg/dL Final  . GFR calc non Af Amer 05/25/2015 10* >60 mL/min Final  . GFR calc Af Amer 05/25/2015 12* >60 mL/min Final   Comment: (NOTE) The eGFR has been calculated using the CKD EPI equation. This calculation has not been validated in all clinical situations. eGFR's persistently <60 mL/min signify possible Chronic Kidney Disease.     STUDIES: No results found.  ASSESSMENT:  Thrombocytopenia. MGUS.  PLAN:   1. Thrombocytopenia. Most  likely secondary to chronic liver disease with splenomegaly. Patient has had previous biopsy at [facility] that showed chronic liver disease. Current platelet levels are 122K. Clinically patient is doing very well. 2. MGUS. Previous workup showed M spike of 1 g/dL on SIEP from October 2014. Lab work from today is still pending. Patient also had hepatosplenomegaly on ultrasound that was done in September 2014.  His most recent labs in November 2016 showed a kappa lambda light chain ratio of 4.10, and M spike of 1.2%, hemoglobin of 9.0. We will await labs from today and discussed with oncologist regarding  follow-up as patient may need to be seen to discuss a plan of care.  Patient expressed understanding and was in agreement with this plan. He also understands that He can call clinic at any time with any questions, concerns, or complaints.   Dr. Choksi was available for consultation and review of plan of care for this patient.  Leslie F Herring, NP   05/25/2015 2:16 PM      

## 2015-05-26 ENCOUNTER — Encounter: Payer: Self-pay | Admitting: Cardiology

## 2015-05-26 ENCOUNTER — Ambulatory Visit (INDEPENDENT_AMBULATORY_CARE_PROVIDER_SITE_OTHER): Payer: Medicare Other | Admitting: Cardiology

## 2015-05-26 ENCOUNTER — Other Ambulatory Visit: Payer: Self-pay | Admitting: Family Medicine

## 2015-05-26 VITALS — BP 102/50 | HR 70 | Ht 71.0 in | Wt 203.5 lb

## 2015-05-26 DIAGNOSIS — E785 Hyperlipidemia, unspecified: Secondary | ICD-10-CM | POA: Insufficient documentation

## 2015-05-26 DIAGNOSIS — I255 Ischemic cardiomyopathy: Secondary | ICD-10-CM

## 2015-05-26 DIAGNOSIS — I10 Essential (primary) hypertension: Secondary | ICD-10-CM

## 2015-05-26 DIAGNOSIS — I208 Other forms of angina pectoris: Secondary | ICD-10-CM | POA: Diagnosis not present

## 2015-05-26 DIAGNOSIS — I2510 Atherosclerotic heart disease of native coronary artery without angina pectoris: Secondary | ICD-10-CM

## 2015-05-26 DIAGNOSIS — N186 End stage renal disease: Secondary | ICD-10-CM

## 2015-05-26 DIAGNOSIS — Z9861 Coronary angioplasty status: Secondary | ICD-10-CM

## 2015-05-26 DIAGNOSIS — I5042 Chronic combined systolic (congestive) and diastolic (congestive) heart failure: Secondary | ICD-10-CM

## 2015-05-26 DIAGNOSIS — I110 Hypertensive heart disease with heart failure: Secondary | ICD-10-CM

## 2015-05-26 DIAGNOSIS — I951 Orthostatic hypotension: Secondary | ICD-10-CM

## 2015-05-26 NOTE — Assessment & Plan Note (Signed)
Well-controlled now. Volume control by hemodialysis. This allowed for much better fluid removal and therefore potential improvement in EF. With lower blood pressure, diastolic filling pressures are also probably lower.

## 2015-05-26 NOTE — Assessment & Plan Note (Signed)
He is now hypotensive after dialysis. He actually has borderline pressures today. I think the best course of action would be to cut back on his beta blocker dose on the evening prior to dialysis. We will have him hold that dose of carvedilol. I then asked that he start taking his dose of carvedilol on the dialysis today a couple hours after he completes dialysis.

## 2015-05-26 NOTE — Assessment & Plan Note (Signed)
His heart failure is accommodation of systolic and diastolic, but it appears that by most recent echo his EF has improved. This may be related to better volume removal and afterload reduction. Blood pressure is much better controlled, in fact he if anything is somewhat hypotensive. He's been on dialysis. This being the ultimate afterload reduction of volume removal. He is only on carvedilol 6.25 twice a day and we'll have him hold his predialysis evening dose of carvedilol.

## 2015-05-26 NOTE — Assessment & Plan Note (Signed)
Notable improvement in his overall EF with accommodation of left main-circumflex stenting and now better volume control with dialysis and afterload reduction. No longer having to worry about inadequate diuresis. Most recent echocardiogram suggested an improved EF above the 40% threshold. At this point, unless we have another reason to check an echocardiogram in the near future, I would probably not refer him to electrophysiology for consideration of ICD. He would probably be a relatively high risk patient for ICD with his ongoing hemodialysis.

## 2015-05-26 NOTE — Assessment & Plan Note (Signed)
Now much better controlled having been on dialysis. He had been up as high as 100 mg 3 times a day of hydralazine in addition to the Imdur and carvedilol. Now he is only on carvedilol with borderline pressures.

## 2015-05-26 NOTE — Assessment & Plan Note (Signed)
At this point, I think his symptoms really are class I nonlimiting anginal symptoms. He pretty much only notes it when he is probably volume overload from not being dialyzed. He is on stable dose of Imdur and Ranexa. Minimal when necessary use of nitroglycerin. He is also on stable dose of carvedilol. He has a LIMA to LAD with diffuse LAD disease and a moderately stenosed ostial circumflex stent with totally occluded right system.  Overall I think his symptoms have much improved since we have had better control of his volume status with dialysis. His blood pressures also been much better making afterload reduction less difficult.

## 2015-05-26 NOTE — Patient Instructions (Signed)
Medication Instructions:  Your physician recommends that you continue on your current medications as directed. Please refer to the Current Medication list given to you today. On dialysis days: do not take coreg the evening before dialysis. You may take it 2 hours after dialysis   Labwork: Fasting liver and lipid profile 3 days before your next follow up appt. Nothing to eat or drink after midnight the evening before your labs  Testing/Procedures: none  Follow-Up: Your physician recommends that you schedule a follow-up appointment in August with Dr. Ellyn Hack.    Any Other Special Instructions Will Be Listed Below (If Applicable).     If you need a refill on your cardiac medications before your next appointment, please call your pharmacy.

## 2015-05-26 NOTE — Assessment & Plan Note (Signed)
Despite having hepatic cirrhosis, he remains on Crestor. Most recent check showed everything being relatively well-controlled with exception of HDL. We should recheck in about 6 months. Low threshold for reducing dose of Crestor given his hepatic history.

## 2015-05-26 NOTE — Progress Notes (Signed)
PCP: Marden Noble, MD  Clinic Note: Chief Complaint  Patient presents with  . Follow-up    3 month f/u no complaints. Meds reviewed verbally with pt.  . Coronary Artery Disease  . Cardiomyopathy    HPI: Joseph [name] is a 70 y.o. male with a PMH below who presents today for delayed hospital f/u & delayed clinic f/u for CAD-non-STEMI with history of CABG and severe native coronary disease along with ischemic cardiomyopathy. CABG in 2001 at [facility], SVG-D2, SVG-OM1, SVG-right PDA, SVG-right PL1) and prior PCI, ischemic cardiomyopathy/chronic systolic CHF with EF [id number] by echo 08/06/2014, palpitations, CKD stage IV (previously treated with dialysis). --> Relook catheterization in July 2016 for recurrent angina showed critical left main disease with ostial circumflex disease. He had Synergy DES stent placed from left main into circumflex. Had patent LIMA-LAD with occluded RCA and graft to the RCA. All grafts besides LIMA were occluded. -- Chest pain 08/26/2014 -> had palpitations and PACs/PAT with bradycardia but no VT on Holter monitor. Low risk VQ scan. Ranexa started that was uptitrated to a month later.  I last saw Joseph [name] in September 2016 for the one and only time that I have seen him- we continued to have exertional dyspnea and chest discomfort. He was however able to do more physical activity and then the previously was able to. Noting that he can walk 15-20 minutes a steady pace without significant symptoms. Unfortunately he has had several hospitalizations since I last saw him.  Recent Hospitalizations:   09/27 - 11/08/2014: Admitted with left-sided anginal pain/pressure. Also noted exertional dyspnea. Was treated for acute on chronic combined heart failure with IV Lasix and nitroglycerin. Ranexa was continued. He had a drop in hemoglobin. Refused 1 unit of blood. --> Elevated troponin consistent with either demand ischemia versus non-STEMI. --> EF noted to be  30-35% by cath.  ICD placement deferred by Dr. Caryl Comes. Not considered to be a candidate at that time.  Was admitted while in [place] over the weekend in late October. Was given IV Lasix for diuresis had mild troponin elevation.  Readmitted 11/16-21/2016: Noted cough and dyspnea with syncope. He noted a short episode of dyspnea with cough that it started about a week before. He was notably hypotensive and passed out. 2 more syncopal episodes prior to arrival and was hypotensive with blood pressures in the 80s. He was started on high dose antibiotics. -> Right upper lobe pneumonia. Simply thought to be hypovolemic. Lasix held.  Day 4 of hospitalization, he suffered acute hypoxic respirations failure likely combination of emphysema and CHF  Was initiated on dialysis on November 18.  1 unit PRBC --> transferred to [facility] visit with Mr. Sharolyn Douglas, NP January 19: Noted being relatively stable without significant dyspnea. Was able to play his "pickle-ball" for ~2hrs 4d/week without CP or DOE.  He did have one episode about 2 half hours long left-sided chest pain that he did not feel was consistent with angina. Medical management continued. Heart failure volume now managed by dialysis.  Hospital admission 03/09/2015: Admitted as a code STEMI with concern for anterior ST elevations. --> Heart cath revealed moderate in-stent restenosis of the ostial circumflex portion of the stent from June 2016. Severe elevated LVEDP noted. Symptoms were thought to be related to hypertensive emergency. Tensely AV fistula could contribute to ischemia from LIMA graft. --> There was a suggestion that his antihypertensives were not being taken properly.  Clinic visit with  Christell Faith, PA-C 03/18/2015: Symptoms notably improved. No further chest pain and blood pressure appeared to be better controlled. Plan was to restart playing Pickle-Ball.  Studies Reviewed:  Echo 11/04/2014: EF 30-35%. Moderate diffuse HK.  AK/scarring of inferior lateral and inferior wall wall - consistent with RCA infarct. Also hypokinesis of basal-mid anterolateral wall.  Paradoxical septal motion. Restrictive physiology/grade 3 diastolic dysfunction with elevated LVEDP. Severe LA dilation. Moderate RV and RA dilation.  Echocardiogram 12/04/2014 [facility]) - 4 non-STEMI: Technically difficult study. Mild concentric LVH. Mild LV dilation. Mild to moderately decreased function with abnormal diastolic function. EF estimated at 40%  Echocardiogram 12/24/2014: EF 30-35%. Diffuse hypokinesis. No cardiac emboli    Left Heart Cath and Coronary Angiography; 03/09/2015   Conclusion     Prox RCA lesion, 100% stenosed.  Ost LAD lesion, 100% stenosed.  Widely patent LIMA. The graft inserts in the mid-LAD and supplies the proximal LAD and diagonals in retrograde fashion. The right PDA is also collateralized from septals of the LAD  Ost Cx lesion, 60% stenosed. The lesion was previously treated with a DES.  There is severe left ventricular systolic dysfunction.  1. Severe native 3 vessel CAD with total occlusion of the LAD and RCA, and moderate in-stent restenosis in the distal left main stent extending into the proximal circumflex.  2. Severe segmental LV dysfunction with severely elevated LVEDP  Suspect combination of hypertensive emergency, uncontrolled diabetes, and diffuse ischemia. In addition, question whether AV fistula may have high-flow and contribute to ischemia with a LIMA graft (pt with a loud diastolic murmur across the precordium). Recommend medical treatment with NTG, heparin, beta-blockade, etc. Discussed findings with family at length.   Wall Motion                  Echocardiogram 03/11/2015: EF 30-35%. Akinesis of the inferior-inferolateral wall grade 2 diastolic dysfunction. High filling pressures. Technically difficult. Mild MR. - Possible echodensity in the mid-apical inferior lateral wall,  cannot exclude thrombus. Less prominent, but noted in November 2016.  Echocardiogram 04/26/2015: EF 40-45%. Mild hypokinesis of inferolateral and inferior wall. Grade 2 diastolic dysfunction. Severe LA dilation. Study suggests suggests improved EF (unlikely)   Interval History: Joseph [name] presents today really feeling well. He actually just got from back from playing a game of pickle ball for the last several hours. He is now back playing about 4 days a week. He says that on his predialysis days, he may notice some exertional dyspnea and maybe a little bit of angina if he really exerts himself on those days, but is only had to take nitroglycerin maybe once or twice every 2 weeks. He states that on dialysis days he does fine and has no symptoms. Regardless of whether he has symptom or not, after a few minutes of resting able to get back up and do exercise without any problems. He feels like his overall rest or status is notably improved since being on dialysis, he just does not enjoy dialysis as it makes him fatigued.  He tells me that for the most part almost every day at dialysis they ask if he is feeling okay, indicating that his blood pressure was running low at dialysis. They have told him to hold his carvedilol until a few hours after dialysis. He will note occasionally a right after dialysis that he has orthostatic dizziness. For the most part, except for may be Sunday night if he hasn't done a full round of dialysis on Saturday, he denies  any PND, orthopnea or edema. Breasts usually also time we would note some more than usual exertional dyspnea. Mostly no edema. No palpitations, syncope/near syncope, or TIA/amaurosis fugax symptoms. No melena, hematochezia, hematuria, or epstaxis. No claudication.  ROS: A comprehensive was performed. Review of Systems  Constitutional: Negative for malaise/fatigue.  HENT: Negative for ear discharge and nosebleeds.   Eyes: Negative for blurred vision.    Respiratory: Positive for shortness of breath (See history of present illness). Negative for cough.   Cardiovascular: Positive for chest pain (See history of present illness). Negative for claudication.       Otherwise negative if not noted in history of present illness  Gastrointestinal: Positive for heartburn (Depending on what he eats). Negative for blood in stool and melena.  Genitourinary: Negative for dysuria and hematuria.       He still makes urine despite being on dialysis.  Musculoskeletal: Positive for myalgias (He sometimes will have night cramps after heavy day of dialysis). Negative for joint pain and falls.  Neurological: Positive for dizziness (Usually on postdialysis days). Negative for weakness and headaches.  Endo/Heme/Allergies: Does not bruise/bleed easily.  Psychiatric/Behavioral: Negative for depression and memory loss. The patient is not nervous/anxious and does not have insomnia.   All other systems reviewed and are negative.   Past Medical History  Diagnosis Date  . Hypertensive heart disease   . Hypercholesterolemia   . Prostate cancer ([place])   . Hepatosplenomegaly 2015    fibrosis, no cirrhosis on 06/2013 liver biopsy  . Thrombocytopenia ([place]) 2015  . Depression with anxiety   . IDDM (insulin dependent diabetes mellitus) ([place])     INSULIN DEPENDENT  . GERD (gastroesophageal reflux disease)   . History of shingles   . Ischemic cardiomyopathy     a. echo 08/06/2014: EF 25-30%, multiple WMA, mod DD, PASP 49 mm Hg;  b. 12/2014 Echo: EF 30-35%, diff HK, mild MR.; Echo 03/2015 - ? LV thrombus (not seen in 3/27 - but EF ? 40-45%.   . CKD (chronic kidney disease), stage IV ([place])     a. previously on dialysis 11/2013->04/2014;  b. 12/2014 Dialysis resumed.  . Coronary artery disease, occlusive     a. 2001 MI s/p 4v CABG (LIMA-LAD, SVG-D2, SVG-OM1, SVG-right PDA, SVG-right PL1); b. cath 07/30/2014 vein grafts all down, patent LIMA to LAD, significant LM and ost LCx dx; c.  08/07/2014 PCI Synergy DES 2.5 mm x 18 mm to dist LM and ost LCx, rec lifelong DAPT.; 02/2015 - Patent LIMA-LAD with L-R Collaterals, 60% ISR of LM-Cx stent (non-occlusive)  . Chronic combined systolic and diastolic CHF, NYHA class 2 ([place])     a. 08/2013 Echo: EF 40-45%, impaired relaxation;  b. 07/2014 Echo: EF 25-30%;  c. 12/2014 Echo: EF 30-35%, diff HK, mild MR.  . Gallstone pancreatitis 2015  . ESRD (end stage renal disease) [facility])     Past Surgical History  Procedure Laterality Date  . Cardiac surgery    . Shoulder arthroscopy    . Penile prosthesis implant    . Coronary angioplasty    . Coronary artery bypass graft  2001    LIMA-LAD, SVG-D2, SVG-OM 1, SVG-r PDA, SVG-r PL 1) --> as of January 2017, all grafts besides LIMA occluded.  . Colonoscopy    . Liver biopsy    . Av fistula placement Left 2015  . Cardiac catheterization N/A 07/30/2014    Procedure: Left Heart Cath and Cors/Grafts Angiography;  Surgeon: Dionisio , MD;  Location:  [place] [facility];  Service: Cardiovascular;  Laterality: N/A;  . Cardiac catheterization N/A 08/07/2014    Procedure: Coronary Stent Intervention;  Surgeon: Lorretta Harp, MD;  Location: [place] [facility];  Service: Cardiovascular;  Left Main-Ostial Circumflex PCI with Synergy DES 2.5 mm x 18 mm  . Cardiac catheterization N/A 03/09/2015    Procedure: Left Heart Cath and Coronary Angiography;  Surgeon: Sherren Mocha, MD;  Location: [facility];  Service: Cardiovascular: 100% oLAD & pRCA, ~60% ISR in oCx. Patent LIMA-LAD with L-R collaterals to rPDA  . Transthoracic echocardiogram  07/2014-04/2015    a. echo 08/06/2014: EF 25-30%, multiple WMA, mod DD, PASP 49 mm Hg;  b. 12/2014 Echo: EF 30-35%, diff HK, mild MR.; Echo 03/2015 - ? LV thrombus (not seen in 3/27 - but EF ? 40-45%.    Prior to Admission medications   Medication Sig Start Date End Date Taking? Authorizing Provider  aspirin EC 81 MG tablet Take 81 mg by mouth daily.   Yes Historical  Provider, MD  calcium acetate (PHOSLO) 667 MG tablet Take 667 mg by mouth daily.  05/21/15  Yes Historical Provider, MD  carvedilol (COREG) 6.25 MG tablet Take 1 tablet (6.25 mg total) by mouth 2 (two) times daily with a meal. 03/11/15  Yes Brittainy Erie Noe, PA-C  furosemide (LASIX) 80 MG tablet Take 80 mg by mouth as needed.    Yes Historical Provider, MD  isosorbide mononitrate (IMDUR) 60 MG 24 hr tablet Take 60 mg by mouth daily.  04/01/15  Yes Historical Provider, MD  LANTUS SOLOSTAR 100 UNIT/ML Solostar Pen Inject 75 Units into the skin 2 (two) times daily.  04/18/15  Yes Historical Provider, MD  lidocaine-prilocaine (EMLA) cream Apply 1 application topically as needed.  01/27/15  Yes Historical Provider, MD  nitroGLYCERIN (NITROLINGUAL) 0.4 MG/SPRAY spray Place 1 spray under the tongue every 5 (five) minutes x 3 doses as needed for chest pain. Do not use together with sublingual nitro 08/08/14  Yes Almyra Deforest, PA  NOVOLOG MIX 70/30 FLEXPEN (70-30) 100 UNIT/ML FlexPen Inject 25 Units into the skin daily with breakfast.  05/06/15  Yes Historical Provider, MD  pregabalin (LYRICA) 75 MG capsule Take 1 capsule (75 mg total) by mouth daily. Patient taking differently: Take 75 mg by mouth 2 (two) times daily.  01/04/15  Yes Nita Sells, MD  ranolazine (RANEXA) 500 MG 12 hr tablet Take 1 tablet (500 mg total) by mouth 2 (two) times daily. 02/25/15  Yes Rogelia Mire, NP  rosuvastatin (CRESTOR) 20 MG tablet Take 1 tablet (20 mg total) by mouth at bedtime. 02/25/15  Yes Rogelia Mire, NP  ticagrelor (BRILINTA) 90 MG TABS tablet Take 1 tablet (90 mg total) by mouth 2 (two) times daily. 12/11/14  Yes Brett Canales, PA-C    Allergies  Allergen Reactions  . Sulfa Antibiotics Itching    hives  . Morphine And Related     Makes patient feel weird    Social History   Social History  . Marital Status: Legally Separated    Spouse Name: N/A  . Number of Children: N/A  . Years of Education:  N/A   Social History Main Topics  . Smoking status: Former Smoker    Quit date: 02/07/2008  . Smokeless tobacco: Former Systems developer  . Alcohol Use: No  . Drug Use: No  . Sexual Activity: Not Asked   Other Topics Concern  . None   Social History Narrative  Family History  Problem Relation Age of Onset  . Acute myelogenous leukemia Brother   . CAD Brother   . Diabetes Mellitus II Brother   . Heart disease Father 51    CABG    Wt Readings from Last 3 Encounters:  05/26/15 203 lb 8 oz (92.307 kg)  05/25/15 201 lb 11.5 oz (91.5 kg)  03/18/15 202 lb 4 oz (91.74 kg)    PHYSICAL EXAM BP 102/50 mmHg  Pulse 70  Ht 5\' 11"  (1.803 m)  Wt 203 lb 8 oz (92.307 kg)  BMI 28.40 kg/m2 General appearance: alert, cooperative, appears stated age, no distress and Relatively healthy-appearing. Well-nourished and well-groomed. Neck: no adenopathy, no carotid bruit and no JVD Lungs: clear to auscultation bilaterally, normal percussion bilaterally and non-labored Heart: regular rate and rhythm, S1 and S2 normal, no murmur, click, rub or gallop; palpate, but nondisplaced PMI. Abdomen: soft, non-tender; bowel sounds normal; no masses,  no organomegaly; mild hepatomegaly, but no significant HJR HSM. Extremities: extremities normal, atraumatic, no cyanosis, or edema . Normal functioning left brachial fistula Pulses: 2+ and symmetric;  Skin: mobility and turgor normal, no eczematous changes, no edema, no evidence of bleeding or bruising and He does have some mild abnormal pigmentation of the lower extremities, but absolutely no edema  Neurologic: Mental status: Alert, oriented, thought content appropriate Cranial nerves: normal (II-XII grossly intact)   Adult ECG Report Not checked.  Other studies Reviewed: Additional studies/ records that were reviewed today include:  Recent Labs:   Lab Results  Component Value Date   CHOL 77 03/10/2015   HDL 23* 03/10/2015   LDLCALC 28 03/10/2015   TRIG 130  03/10/2015   CHOLHDL 3.3 03/10/2015   Lab Results  Component Value Date   ALT 14* 12/11/2014   AST 20 12/11/2014   ALKPHOS 77 12/11/2014   BILITOT 0.9 12/11/2014    ASSESSMENT / PLAN: Problem List Items Addressed This Visit    Stable angina ([place]) (Chronic)    At this point, I think his symptoms really are class I nonlimiting anginal symptoms. He pretty much only notes it when he is probably volume overload from not being dialyzed. He is on stable dose of Imdur and Ranexa. Minimal when necessary use of nitroglycerin. He is also on stable dose of carvedilol. He has a LIMA to LAD with diffuse LAD disease and a moderately stenosed ostial circumflex stent with totally occluded right system.  Overall I think his symptoms have much improved since we have had better control of his volume status with dialysis. His blood pressures also been much better making afterload reduction less difficult.      Orthostatic hypotension (Chronic)    He is now hypotensive after dialysis. He actually has borderline pressures today. I think the best course of action would be to cut back on his beta blocker dose on the evening prior to dialysis. We will have him hold that dose of carvedilol. I then asked that he start taking his dose of carvedilol on the dialysis today a couple hours after he completes dialysis.      Ischemic cardiomyopathy - Primary (Chronic)    Notable improvement in his overall EF with accommodation of left main-circumflex stenting and now better volume control with dialysis and afterload reduction. No longer having to worry about inadequate diuresis. Most recent echocardiogram suggested an improved EF above the 40% threshold. At this point, unless we have another reason to check an echocardiogram in the near future, I would probably  not refer him to electrophysiology for consideration of ICD. He would probably be a relatively high risk patient for ICD with his ongoing hemodialysis.       Hypertensive heart disease with heart failure (HCC) (Chronic)    His heart failure is accommodation of systolic and diastolic, but it appears that by most recent echo his EF has improved. This may be related to better volume removal and afterload reduction. Blood pressure is much better controlled, in fact he if anything is somewhat hypotensive. He's been on dialysis. This being the ultimate afterload reduction of volume removal. He is only on carvedilol 6.25 twice a day and we'll have him hold his predialysis evening dose of carvedilol.      Hyperlipidemia with target LDL less than 70 (Chronic)   Essential hypertension (Chronic)    Now much better controlled having been on dialysis. He had been up as high as 100 mg 3 times a day of hydralazine in addition to the Imdur and carvedilol. Now he is only on carvedilol with borderline pressures.      ESRD (end stage renal disease) (HCC) (Chronic)   Dyslipidemia, goal LDL below 70 (Chronic)    Despite having hepatic cirrhosis, he remains on Crestor. Most recent check showed everything being relatively well-controlled with exception of HDL. We should recheck in about 6 months. Low threshold for reducing dose of Crestor given his hepatic history.      Chronic combined systolic and diastolic heart failure (HCC) (Chronic)    Well-controlled now. Volume control by hemodialysis. This allowed for much better fluid removal and therefore potential improvement in EF. With lower blood pressure, diastolic filling pressures are also probably lower.      CAD s/p CABG 2001, prior POBA, PCI/DES July 2016 (Chronic)    Still having some exertional angina is noted. He has an ostial circumflex stent coming in from the LM.  There seems to be at least moderate stenosis. I wonder if the sizing of the extent was somewhat undersized. Watch for exacerbation of symptoms. But for now he seems very well. I would continue Brilinta beyond 1 year. I think we can stop aspirin one  year. He is on carvedilol and Crestor. His also on an antianginal medications of Imdur and Ranexa. Back to playing baseline Pickle Ball.      Relevant Orders   Hepatic function panel   Lipid Profile      Current medicines are reviewed at length with the patient today. (+/- concerns) Concern about hypotension on dialysis days The following changes have been made:  Medication Instructions:  Your physician recommends that you continue on your current medications as directed. Please refer to the Current Medication list given to you today. On dialysis days: do not take coreg the evening before dialysis. You may take it 2 hours after dialysis   Labwork: Fasting liver and lipid profile 3 days before your next follow up appt. Nothing to eat or drink after midnight the evening before your labs  Testing/Procedures: none  Follow-Up: Your physician recommends that you schedule a follow-up appointment in August with Dr. Ellyn Hack.     Studies Ordered:   Orders Placed This Encounter  Procedures  . Hepatic function panel  . Lipid Profile   This patient is extremely, located with multiple ongoing conditions. Had extensive amount of history in the intervening 6 months since his last visit. This required prolonged chart review for familiarization & annotation. ~45 minutes of chart review.  I then spent close to  40 minutes in direct consultation with the patient, greater than 50% of which was spent discussing patient's symptoms and plans for upcoming treatment options. We also discussed prior evaluations and reviewed results of his echocardiogram studies and heart catheterization.     Leonie Man, M.D., M.S. Interventional Cardiologist   Pager # [phone] Phone # [phone] [address] [place], [place] [address]

## 2015-05-26 NOTE — Assessment & Plan Note (Signed)
Still having some exertional angina is noted. He has an ostial circumflex stent coming in from the LM.  There seems to be at least moderate stenosis. I wonder if the sizing of the extent was somewhat undersized. Watch for exacerbation of symptoms. But for now he seems very well. I would continue Brilinta beyond 1 year. I think we can stop aspirin one year. He is on carvedilol and Crestor. His also on an antianginal medications of Imdur and Ranexa. Back to playing baseline Pickle Ball.

## 2015-05-31 ENCOUNTER — Other Ambulatory Visit: Payer: Self-pay | Admitting: Family Medicine

## 2015-05-31 DIAGNOSIS — D472 Monoclonal gammopathy: Secondary | ICD-10-CM

## 2015-06-01 ENCOUNTER — Inpatient Hospital Stay: Payer: Medicare Other

## 2015-06-01 DIAGNOSIS — D472 Monoclonal gammopathy: Secondary | ICD-10-CM

## 2015-06-02 LAB — PROTEIN ELECTROPHORESIS, SERUM
A/G RATIO SPE: 0.9 (ref 0.7–1.7)
ALBUMIN ELP: 3.8 g/dL (ref 2.9–4.4)
ALPHA-1-GLOBULIN: 0.3 g/dL (ref 0.0–0.4)
ALPHA-2-GLOBULIN: 0.9 g/dL (ref 0.4–1.0)
BETA GLOBULIN: 1 g/dL (ref 0.7–1.3)
Gamma Globulin: 2 g/dL — ABNORMAL HIGH (ref 0.4–1.8)
Globulin, Total: 4.3 g/dL — ABNORMAL HIGH (ref 2.2–3.9)
M-Spike, %: 1.7 g/dL — ABNORMAL HIGH
TOTAL PROTEIN ELP: 8.1 g/dL (ref 6.0–8.5)

## 2015-06-02 LAB — KAPPA/LAMBDA LIGHT CHAINS
Kappa free light chain: 150.92 mg/L — ABNORMAL HIGH (ref 3.30–19.40)
Kappa, lambda light chain ratio: 2.43 — ABNORMAL HIGH (ref 0.26–1.65)
Lambda free light chains: 61.98 mg/L — ABNORMAL HIGH (ref 5.71–26.30)

## 2015-07-05 ENCOUNTER — Inpatient Hospital Stay (HOSPITAL_COMMUNITY)
Admission: EM | Admit: 2015-07-05 | Discharge: 2015-09-07 | DRG: 003 | Disposition: E | Payer: Medicare Other | Attending: Cardiothoracic Surgery | Admitting: Cardiothoracic Surgery

## 2015-07-05 ENCOUNTER — Emergency Department (HOSPITAL_COMMUNITY): Payer: Medicare Other

## 2015-07-05 ENCOUNTER — Encounter (HOSPITAL_COMMUNITY): Payer: Self-pay | Admitting: Emergency Medicine

## 2015-07-05 DIAGNOSIS — J9690 Respiratory failure, unspecified, unspecified whether with hypoxia or hypercapnia: Secondary | ICD-10-CM

## 2015-07-05 DIAGNOSIS — B371 Pulmonary candidiasis: Secondary | ICD-10-CM | POA: Diagnosis not present

## 2015-07-05 DIAGNOSIS — E1122 Type 2 diabetes mellitus with diabetic chronic kidney disease: Secondary | ICD-10-CM | POA: Diagnosis present

## 2015-07-05 DIAGNOSIS — I255 Ischemic cardiomyopathy: Secondary | ICD-10-CM | POA: Diagnosis present

## 2015-07-05 DIAGNOSIS — Z8546 Personal history of malignant neoplasm of prostate: Secondary | ICD-10-CM

## 2015-07-05 DIAGNOSIS — I252 Old myocardial infarction: Secondary | ICD-10-CM

## 2015-07-05 DIAGNOSIS — I110 Hypertensive heart disease with heart failure: Secondary | ICD-10-CM | POA: Diagnosis present

## 2015-07-05 DIAGNOSIS — Z87891 Personal history of nicotine dependence: Secondary | ICD-10-CM

## 2015-07-05 DIAGNOSIS — J189 Pneumonia, unspecified organism: Secondary | ICD-10-CM | POA: Insufficient documentation

## 2015-07-05 DIAGNOSIS — D696 Thrombocytopenia, unspecified: Secondary | ICD-10-CM | POA: Diagnosis present

## 2015-07-05 DIAGNOSIS — Z9689 Presence of other specified functional implants: Secondary | ICD-10-CM

## 2015-07-05 DIAGNOSIS — Z4659 Encounter for fitting and adjustment of other gastrointestinal appliance and device: Secondary | ICD-10-CM

## 2015-07-05 DIAGNOSIS — I200 Unstable angina: Secondary | ICD-10-CM

## 2015-07-05 DIAGNOSIS — E1129 Type 2 diabetes mellitus with other diabetic kidney complication: Secondary | ICD-10-CM | POA: Diagnosis present

## 2015-07-05 DIAGNOSIS — K740 Hepatic fibrosis: Secondary | ICD-10-CM | POA: Diagnosis present

## 2015-07-05 DIAGNOSIS — R079 Chest pain, unspecified: Secondary | ICD-10-CM | POA: Diagnosis not present

## 2015-07-05 DIAGNOSIS — I2089 Other forms of angina pectoris: Secondary | ICD-10-CM

## 2015-07-05 DIAGNOSIS — I2510 Atherosclerotic heart disease of native coronary artery without angina pectoris: Secondary | ICD-10-CM | POA: Diagnosis present

## 2015-07-05 DIAGNOSIS — Z7982 Long term (current) use of aspirin: Secondary | ICD-10-CM

## 2015-07-05 DIAGNOSIS — Z9861 Coronary angioplasty status: Secondary | ICD-10-CM

## 2015-07-05 DIAGNOSIS — N179 Acute kidney failure, unspecified: Secondary | ICD-10-CM | POA: Insufficient documentation

## 2015-07-05 DIAGNOSIS — Z515 Encounter for palliative care: Secondary | ICD-10-CM | POA: Diagnosis not present

## 2015-07-05 DIAGNOSIS — I208 Other forms of angina pectoris: Secondary | ICD-10-CM

## 2015-07-05 DIAGNOSIS — I25110 Atherosclerotic heart disease of native coronary artery with unstable angina pectoris: Secondary | ICD-10-CM | POA: Diagnosis present

## 2015-07-05 DIAGNOSIS — E785 Hyperlipidemia, unspecified: Secondary | ICD-10-CM | POA: Diagnosis present

## 2015-07-05 DIAGNOSIS — I878 Other specified disorders of veins: Secondary | ICD-10-CM | POA: Diagnosis present

## 2015-07-05 DIAGNOSIS — E7800 Pure hypercholesterolemia, unspecified: Secondary | ICD-10-CM | POA: Diagnosis present

## 2015-07-05 DIAGNOSIS — I9751 Accidental puncture and laceration of a circulatory system organ or structure during a circulatory system procedure: Secondary | ICD-10-CM | POA: Diagnosis not present

## 2015-07-05 DIAGNOSIS — N186 End stage renal disease: Secondary | ICD-10-CM | POA: Diagnosis present

## 2015-07-05 DIAGNOSIS — A419 Sepsis, unspecified organism: Secondary | ICD-10-CM | POA: Diagnosis not present

## 2015-07-05 DIAGNOSIS — R17 Unspecified jaundice: Secondary | ICD-10-CM | POA: Insufficient documentation

## 2015-07-05 DIAGNOSIS — D638 Anemia in other chronic diseases classified elsewhere: Secondary | ICD-10-CM

## 2015-07-05 DIAGNOSIS — Y713 Surgical instruments, materials and cardiovascular devices (including sutures) associated with adverse incidents: Secondary | ICD-10-CM | POA: Diagnosis not present

## 2015-07-05 DIAGNOSIS — L8990 Pressure ulcer of unspecified site, unspecified stage: Secondary | ICD-10-CM | POA: Insufficient documentation

## 2015-07-05 DIAGNOSIS — F418 Other specified anxiety disorders: Secondary | ICD-10-CM | POA: Diagnosis present

## 2015-07-05 DIAGNOSIS — Y838 Other surgical procedures as the cause of abnormal reaction of the patient, or of later complication, without mention of misadventure at the time of the procedure: Secondary | ICD-10-CM | POA: Diagnosis not present

## 2015-07-05 DIAGNOSIS — J449 Chronic obstructive pulmonary disease, unspecified: Secondary | ICD-10-CM | POA: Diagnosis present

## 2015-07-05 DIAGNOSIS — R570 Cardiogenic shock: Secondary | ICD-10-CM | POA: Insufficient documentation

## 2015-07-05 DIAGNOSIS — J9601 Acute respiratory failure with hypoxia: Secondary | ICD-10-CM

## 2015-07-05 DIAGNOSIS — Z789 Other specified health status: Secondary | ICD-10-CM

## 2015-07-05 DIAGNOSIS — E46 Unspecified protein-calorie malnutrition: Secondary | ICD-10-CM | POA: Diagnosis not present

## 2015-07-05 DIAGNOSIS — K7460 Unspecified cirrhosis of liver: Secondary | ICD-10-CM | POA: Insufficient documentation

## 2015-07-05 DIAGNOSIS — I25710 Atherosclerosis of autologous vein coronary artery bypass graft(s) with unstable angina pectoris: Secondary | ICD-10-CM | POA: Diagnosis present

## 2015-07-05 DIAGNOSIS — J80 Acute respiratory distress syndrome: Secondary | ICD-10-CM | POA: Diagnosis not present

## 2015-07-05 DIAGNOSIS — Z930 Tracheostomy status: Secondary | ICD-10-CM

## 2015-07-05 DIAGNOSIS — Z794 Long term (current) use of insulin: Secondary | ICD-10-CM

## 2015-07-05 DIAGNOSIS — K7031 Alcoholic cirrhosis of liver with ascites: Secondary | ICD-10-CM | POA: Insufficient documentation

## 2015-07-05 DIAGNOSIS — T85598A Other mechanical complication of other gastrointestinal prosthetic devices, implants and grafts, initial encounter: Secondary | ICD-10-CM

## 2015-07-05 DIAGNOSIS — I132 Hypertensive heart and chronic kidney disease with heart failure and with stage 5 chronic kidney disease, or end stage renal disease: Secondary | ICD-10-CM | POA: Diagnosis present

## 2015-07-05 DIAGNOSIS — E876 Hypokalemia: Secondary | ICD-10-CM | POA: Diagnosis not present

## 2015-07-05 DIAGNOSIS — J9383 Other pneumothorax: Secondary | ICD-10-CM | POA: Diagnosis not present

## 2015-07-05 DIAGNOSIS — E874 Mixed disorder of acid-base balance: Secondary | ICD-10-CM | POA: Diagnosis not present

## 2015-07-05 DIAGNOSIS — J810 Acute pulmonary edema: Secondary | ICD-10-CM

## 2015-07-05 DIAGNOSIS — S2612XA Laceration of heart without hemopericardium, initial encounter: Secondary | ICD-10-CM | POA: Diagnosis not present

## 2015-07-05 DIAGNOSIS — S00522A Blister (nonthermal) of oral cavity, initial encounter: Secondary | ICD-10-CM | POA: Diagnosis present

## 2015-07-05 DIAGNOSIS — Z992 Dependence on renal dialysis: Secondary | ICD-10-CM

## 2015-07-05 DIAGNOSIS — K219 Gastro-esophageal reflux disease without esophagitis: Secondary | ICD-10-CM | POA: Diagnosis present

## 2015-07-05 DIAGNOSIS — Z452 Encounter for adjustment and management of vascular access device: Secondary | ICD-10-CM

## 2015-07-05 DIAGNOSIS — Z978 Presence of other specified devices: Secondary | ICD-10-CM

## 2015-07-05 DIAGNOSIS — Z951 Presence of aortocoronary bypass graft: Secondary | ICD-10-CM | POA: Insufficient documentation

## 2015-07-05 DIAGNOSIS — Z66 Do not resuscitate: Secondary | ICD-10-CM | POA: Diagnosis not present

## 2015-07-05 DIAGNOSIS — K7200 Acute and subacute hepatic failure without coma: Secondary | ICD-10-CM | POA: Diagnosis not present

## 2015-07-05 DIAGNOSIS — E872 Acidosis: Secondary | ICD-10-CM | POA: Diagnosis present

## 2015-07-05 DIAGNOSIS — J939 Pneumothorax, unspecified: Secondary | ICD-10-CM

## 2015-07-05 DIAGNOSIS — I5042 Chronic combined systolic (congestive) and diastolic (congestive) heart failure: Secondary | ICD-10-CM | POA: Diagnosis present

## 2015-07-05 DIAGNOSIS — R6521 Severe sepsis with septic shock: Secondary | ICD-10-CM | POA: Diagnosis not present

## 2015-07-05 DIAGNOSIS — D65 Disseminated intravascular coagulation [defibrination syndrome]: Secondary | ICD-10-CM | POA: Diagnosis not present

## 2015-07-05 DIAGNOSIS — I214 Non-ST elevation (NSTEMI) myocardial infarction: Secondary | ICD-10-CM | POA: Diagnosis not present

## 2015-07-05 DIAGNOSIS — Z6828 Body mass index (BMI) 28.0-28.9, adult: Secondary | ICD-10-CM

## 2015-07-05 DIAGNOSIS — Z9289 Personal history of other medical treatment: Secondary | ICD-10-CM

## 2015-07-05 DIAGNOSIS — Z01810 Encounter for preprocedural cardiovascular examination: Secondary | ICD-10-CM

## 2015-07-05 DIAGNOSIS — D691 Qualitative platelet defects: Secondary | ICD-10-CM

## 2015-07-05 DIAGNOSIS — R0602 Shortness of breath: Secondary | ICD-10-CM

## 2015-07-05 DIAGNOSIS — Y95 Nosocomial condition: Secondary | ICD-10-CM | POA: Diagnosis not present

## 2015-07-05 DIAGNOSIS — Z09 Encounter for follow-up examination after completed treatment for conditions other than malignant neoplasm: Secondary | ICD-10-CM

## 2015-07-05 DIAGNOSIS — R001 Bradycardia, unspecified: Secondary | ICD-10-CM | POA: Diagnosis not present

## 2015-07-05 DIAGNOSIS — Z7902 Long term (current) use of antithrombotics/antiplatelets: Secondary | ICD-10-CM

## 2015-07-05 DIAGNOSIS — J9620 Acute and chronic respiratory failure, unspecified whether with hypoxia or hypercapnia: Secondary | ICD-10-CM

## 2015-07-05 DIAGNOSIS — E875 Hyperkalemia: Secondary | ICD-10-CM | POA: Diagnosis present

## 2015-07-05 DIAGNOSIS — R162 Hepatomegaly with splenomegaly, not elsewhere classified: Secondary | ICD-10-CM

## 2015-07-05 DIAGNOSIS — A047 Enterocolitis due to Clostridium difficile: Secondary | ICD-10-CM | POA: Diagnosis not present

## 2015-07-05 DIAGNOSIS — K567 Ileus, unspecified: Secondary | ICD-10-CM

## 2015-07-05 LAB — BASIC METABOLIC PANEL
ANION GAP: 12 (ref 5–15)
BUN: 70 mg/dL — AB (ref 6–20)
CALCIUM: 7.9 mg/dL — AB (ref 8.9–10.3)
CO2: 23 mmol/L (ref 22–32)
CREATININE: 8 mg/dL — AB (ref 0.61–1.24)
Chloride: 105 mmol/L (ref 101–111)
GFR calc Af Amer: 7 mL/min — ABNORMAL LOW (ref >60)
GFR, EST NON AFRICAN AMERICAN: 6 mL/min — AB (ref >60)
GLUCOSE: 202 mg/dL — AB (ref 65–99)
Potassium: 5.3 mmol/L — ABNORMAL HIGH (ref 3.5–5.1)
Sodium: 140 mmol/L (ref 135–145)

## 2015-07-05 LAB — I-STAT TROPONIN, ED: Troponin i, poc: 0.05 ng/mL (ref 0.00–0.08)

## 2015-07-05 LAB — CBC
HEMATOCRIT: 32.2 % — AB (ref 39.0–52.0)
HEMOGLOBIN: 10.4 g/dL — AB (ref 13.0–17.0)
MCH: 30.2 pg (ref 26.0–34.0)
MCHC: 32.3 g/dL (ref 30.0–36.0)
MCV: 93.6 fL (ref 78.0–100.0)
Platelets: 86 10*3/uL — ABNORMAL LOW (ref 150–400)
RBC: 3.44 MIL/uL — ABNORMAL LOW (ref 4.22–5.81)
RDW: 15.3 % (ref 11.5–15.5)
WBC: 5.6 10*3/uL (ref 4.0–10.5)

## 2015-07-05 LAB — GLUCOSE, CAPILLARY: Glucose-Capillary: 141 mg/dL — ABNORMAL HIGH (ref 65–99)

## 2015-07-05 LAB — PROTIME-INR
INR: 1.22 (ref 0.00–1.49)
Prothrombin Time: 15.6 seconds — ABNORMAL HIGH (ref 11.6–15.2)

## 2015-07-05 LAB — TROPONIN I: Troponin I: 0.13 ng/mL — ABNORMAL HIGH (ref <0.031)

## 2015-07-05 MED ORDER — TICAGRELOR 90 MG PO TABS
90.0000 mg | ORAL_TABLET | Freq: Two times a day (BID) | ORAL | Status: DC
  Administered 2015-07-05 – 2015-07-08 (×6): 90 mg via ORAL
  Filled 2015-07-05 (×6): qty 1

## 2015-07-05 MED ORDER — PREGABALIN 50 MG PO CAPS
75.0000 mg | ORAL_CAPSULE | Freq: Two times a day (BID) | ORAL | Status: DC
  Administered 2015-07-05 – 2015-07-14 (×19): 75 mg via ORAL
  Filled 2015-07-05 (×19): qty 1

## 2015-07-05 MED ORDER — INSULIN ASPART 100 UNIT/ML ~~LOC~~ SOLN
0.0000 [IU] | Freq: Three times a day (TID) | SUBCUTANEOUS | Status: DC
  Administered 2015-07-06 – 2015-07-08 (×3): 3 [IU] via SUBCUTANEOUS
  Administered 2015-07-09: 2 [IU] via SUBCUTANEOUS
  Administered 2015-07-10: 5 [IU] via SUBCUTANEOUS
  Administered 2015-07-10 – 2015-07-11 (×3): 3 [IU] via SUBCUTANEOUS
  Administered 2015-07-12 (×2): 2 [IU] via SUBCUTANEOUS
  Administered 2015-07-12: 5 [IU] via SUBCUTANEOUS
  Administered 2015-07-13 – 2015-07-14 (×2): 3 [IU] via SUBCUTANEOUS

## 2015-07-05 MED ORDER — ROSUVASTATIN CALCIUM 20 MG PO TABS
20.0000 mg | ORAL_TABLET | Freq: Every day | ORAL | Status: DC
  Administered 2015-07-05 – 2015-07-14 (×10): 20 mg via ORAL
  Filled 2015-07-05 (×10): qty 2

## 2015-07-05 MED ORDER — CALCIUM ACETATE (PHOS BINDER) 667 MG PO CAPS
1334.0000 mg | ORAL_CAPSULE | Freq: Three times a day (TID) | ORAL | Status: DC
  Administered 2015-07-06 – 2015-07-21 (×18): 1334 mg via ORAL
  Filled 2015-07-05 (×25): qty 2

## 2015-07-05 MED ORDER — CALCIUM ACETATE (PHOS BINDER) 667 MG PO TABS: 1334.0000 mg | ORAL_TABLET | Freq: Three times a day (TID) | ORAL | Status: DC

## 2015-07-05 MED ORDER — ACETAMINOPHEN 325 MG PO TABS
650.0000 mg | ORAL_TABLET | ORAL | Status: DC | PRN
  Filled 2015-07-05: qty 2

## 2015-07-05 MED ORDER — NITROGLYCERIN 0.4 MG SL SUBL
0.4000 mg | SUBLINGUAL_TABLET | SUBLINGUAL | Status: DC | PRN
  Administered 2015-07-06 – 2015-07-14 (×7): 0.4 mg via SUBLINGUAL
  Filled 2015-07-05 (×5): qty 1

## 2015-07-05 MED ORDER — ASPIRIN EC 81 MG PO TBEC
81.0000 mg | DELAYED_RELEASE_TABLET | Freq: Every day | ORAL | Status: DC
  Administered 2015-07-06 – 2015-07-13 (×8): 81 mg via ORAL
  Filled 2015-07-05 (×9): qty 1

## 2015-07-05 MED ORDER — RANOLAZINE ER 500 MG PO TB12
500.0000 mg | ORAL_TABLET | Freq: Two times a day (BID) | ORAL | Status: DC
  Administered 2015-07-05 – 2015-07-14 (×19): 500 mg via ORAL
  Filled 2015-07-05 (×20): qty 1

## 2015-07-05 MED ORDER — CARVEDILOL 6.25 MG PO TABS
6.2500 mg | ORAL_TABLET | Freq: Two times a day (BID) | ORAL | Status: AC
  Administered 2015-07-06 – 2015-07-14 (×15): 6.25 mg via ORAL
  Filled 2015-07-05 (×18): qty 1

## 2015-07-05 MED ORDER — INSULIN GLARGINE 100 UNIT/ML ~~LOC~~ SOLN
20.0000 [IU] | Freq: Every day | SUBCUTANEOUS | Status: DC
  Administered 2015-07-05 – 2015-07-14 (×10): 20 [IU] via SUBCUTANEOUS
  Filled 2015-07-05 (×11): qty 0.2

## 2015-07-05 MED ORDER — ISOSORBIDE MONONITRATE ER 60 MG PO TB24
60.0000 mg | ORAL_TABLET | Freq: Every day | ORAL | Status: DC
  Filled 2015-07-05: qty 1

## 2015-07-05 MED ORDER — INSULIN GLARGINE 100 UNIT/ML SOLOSTAR PEN: 20.0000 [IU] | PEN_INJECTOR | Freq: Every day | SUBCUTANEOUS | Status: DC

## 2015-07-05 MED ORDER — ONDANSETRON HCL 4 MG/2ML IJ SOLN: 4.0000 mg | Freq: Four times a day (QID) | INTRAMUSCULAR | Status: DC | PRN

## 2015-07-05 NOTE — ED Provider Notes (Signed)
CSN: [id number]     Arrival date & time 06/18/2015  1710 History   First MD Initiated Contact with Patient 06/15/2015 1710     Chief Complaint  Patient presents with  . Chest Pain     (Consider location/radiation/quality/duration/timing/severity/associated sxs/prior Treatment) Patient is a 70 y.o. male presenting with general illness. The history is provided by the patient and the EMS personnel.  Illness Location:  Left-sided chest pain Severity:  Moderate Onset quality:  Sudden Duration:  1 hour Timing:  Constant Progression:  Resolved Chronicity:  Recurrent Context:  History of CABG in 2001, stents, on aspirin and presented. Onset of left-sided chest pain. Resolved after taking 4 aspirin 81 and nitroglycerin. Feels like prior MIs. Associated symptoms: chest pain and shortness of breath   Associated symptoms: no abdominal pain, no cough, no diarrhea, no fever, no headaches, no nausea and no vomiting     Past Medical History  Diagnosis Date  . Hypertensive heart disease   . Hypercholesterolemia   . Prostate cancer ([place])   . Hepatosplenomegaly 2015    fibrosis, no cirrhosis on 06/2013 liver biopsy  . Thrombocytopenia ([place]) 2015  . Depression with anxiety   . IDDM (insulin dependent diabetes mellitus) ([place])     INSULIN DEPENDENT  . GERD (gastroesophageal reflux disease)   . History of shingles   . Ischemic cardiomyopathy     a. echo 08/06/2014: EF 25-30%, multiple WMA, mod DD, PASP 49 mm Hg;  b. 12/2014 Echo: EF 30-35%, diff HK, mild MR.; Echo 03/2015 - ? LV thrombus (not seen in 3/27 - but EF ? 40-45%.   . CKD (chronic kidney disease), stage IV ([facility])     a. previously on dialysis 11/2013->04/2014;  b. 12/2014 Dialysis resumed.  . Coronary artery disease, occlusive     a. 2001 MI s/p 4v CABG (LIMA-LAD, SVG-D2, SVG-OM1, SVG-right PDA, SVG-right PL1); b. cath 07/30/2014 vein grafts all down, patent LIMA to LAD, significant LM and ost LCx dx; c. 08/07/2014 PCI Synergy DES 2.5 mm x 18 mm to  dist LM and ost LCx, rec lifelong DAPT.; 02/2015 - Patent LIMA-LAD with L-R Collaterals, 60% ISR of LM-Cx stent (non-occlusive)  . Chronic combined systolic and diastolic CHF, NYHA class 2 ([place])     a. 08/2013 Echo: EF 40-45%, impaired relaxation;  b. 07/2014 Echo: EF 25-30%;  c. 12/2014 Echo: EF 30-35%, diff HK, mild MR.  . Gallstone pancreatitis 2015  . ESRD (end stage renal disease) [facility])    Past Surgical History  Procedure Laterality Date  . Cardiac surgery    . Shoulder arthroscopy    . Penile prosthesis implant    . Coronary angioplasty    . Coronary artery bypass graft  2001    LIMA-LAD, SVG-D2, SVG-OM 1, SVG-r PDA, SVG-r PL 1) --> as of January 2017, all grafts besides LIMA occluded.  . Colonoscopy    . Liver biopsy    . Av fistula placement Left 2015  . Cardiac catheterization N/A 07/30/2014    Procedure: Left Heart Cath and Cors/Grafts Angiography;  Surgeon: Dionisio David, MD;  Location: [place] [facility];  Service: Cardiovascular;  Laterality: N/A;  . Cardiac catheterization N/A 08/07/2014    Procedure: Coronary Stent Intervention;  Surgeon: Lorretta Harp, MD;  Location: [place] [facility];  Service: Cardiovascular;  Left Main-Ostial Circumflex PCI with Synergy DES 2.5 mm x 18 mm  . Cardiac catheterization N/A 03/09/2015    Procedure: Left Heart Cath and Coronary Angiography;  Surgeon:  Sherren Mocha, MD;  Location: [place] [facility];  Service: Cardiovascular: 100% oLAD & pRCA, ~60% ISR in oCx. Patent LIMA-LAD with L-R collaterals to rPDA  . Transthoracic echocardiogram  07/2014-04/2015    a. echo 08/06/2014: EF 25-30%, multiple WMA, mod DD, PASP 49 mm Hg;  b. 12/2014 Echo: EF 30-35%, diff HK, mild MR.; Echo 03/2015 - ? LV thrombus (not seen in 3/27 - but EF ? 40-45%.    Family History  Problem Relation Age of Onset  . Acute myelogenous leukemia Brother   . CAD Brother   . Diabetes Mellitus II Brother   . Heart disease Father 43    CABG   Social History  Substance Use  Topics  . Smoking status: Former Smoker    Quit date: 02/07/2008  . Smokeless tobacco: Former Systems developer  . Alcohol Use: No    Review of Systems  Constitutional: Negative for fever and chills.  Respiratory: Positive for shortness of breath. Negative for cough.   Cardiovascular: Positive for chest pain. Negative for palpitations and leg swelling.  Gastrointestinal: Negative for nausea, vomiting, abdominal pain and diarrhea.  Neurological: Negative for light-headedness and headaches.  All other systems reviewed and are negative.     Allergies  Sulfa antibiotics and Morphine and related  Home Medications   Prior to Admission medications   Medication Sig Start Date End Date Taking? Authorizing Provider  aspirin EC 81 MG tablet Take 81 mg by mouth daily.   Yes Historical Provider, MD  calcium acetate (PHOSLO) 667 MG tablet Take 1,334 mg by mouth 3 (three) times daily with meals.  05/21/15  Yes Historical Provider, MD  carvedilol (COREG) 6.25 MG tablet Take 1 tablet (6.25 mg total) by mouth 2 (two) times daily with a meal. 03/11/15  Yes Brittainy Erie Noe, PA-C  furosemide (LASIX) 80 MG tablet Take 80 mg by mouth as needed for fluid.    Yes Historical Provider, MD  isosorbide mononitrate (IMDUR) 30 MG 24 hr tablet Take 30 mg by mouth daily. 06/24/15  Yes Historical Provider, MD  isosorbide mononitrate (IMDUR) 60 MG 24 hr tablet Take 60 mg by mouth daily.  04/01/15  Yes Historical Provider, MD  LANTUS SOLOSTAR 100 UNIT/ML Solostar Pen Inject 20 Units into the skin daily at 10 pm.  04/18/15  Yes Historical Provider, MD  lidocaine-prilocaine (EMLA) cream Apply 1 application topically Every Tuesday,Thursday,and Saturday with dialysis.  01/27/15  Yes Historical Provider, MD  naproxen sodium (ANAPROX) 220 MG tablet Take 220 mg by mouth 2 (two) times daily with a meal.   Yes Historical Provider, MD  nitroGLYCERIN (NITROLINGUAL) 0.4 MG/SPRAY spray Place 1 spray under the tongue every 5 (five) minutes x 3  doses as needed for chest pain. Do not use together with sublingual nitro 08/08/14  Yes Almyra Deforest, PA  NOVOLOG MIX 70/30 FLEXPEN (70-30) 100 UNIT/ML FlexPen Inject 25 Units into the skin daily with breakfast.  05/06/15  Yes Historical Provider, MD  pregabalin (LYRICA) 75 MG capsule Take 1 capsule (75 mg total) by mouth daily. Patient taking differently: Take 75 mg by mouth 2 (two) times daily.  01/04/15  Yes Nita Sells, MD  ranolazine (RANEXA) 500 MG 12 hr tablet Take 1 tablet (500 mg total) by mouth 2 (two) times daily. 02/25/15  Yes Rogelia Mire, NP  rosuvastatin (CRESTOR) 20 MG tablet Take 1 tablet (20 mg total) by mouth at bedtime. 02/25/15  Yes Rogelia Mire, NP  ticagrelor (BRILINTA) 90 MG TABS tablet Take 1  tablet (90 mg total) by mouth 2 (two) times daily. 12/11/14  Yes Einar Pheasant Hager, PA-C   BP 175/74 mmHg  Pulse 58  Temp(Src) 97.7 F (36.5 C) (Oral)  Resp 18  Ht 5\' 11"  (1.803 m)  Wt 97.841 kg  BMI 30.10 kg/m2  SpO2 100% Physical Exam  Constitutional: He is oriented to person, place, and time. He appears well-developed and well-nourished. No distress.  HENT:  Head: Normocephalic and atraumatic.  Eyes: EOM are normal. Pupils are equal, round, and reactive to light.  Cardiovascular: Normal rate, regular rhythm and intact distal pulses.   Pulmonary/Chest: Effort normal. No respiratory distress. He has no wheezes. He has no rhonchi.  Abdominal: Soft. There is no tenderness.  Musculoskeletal: He exhibits no edema.       Right lower leg: He exhibits no swelling and no edema.       Left lower leg: He exhibits no swelling and no edema.  Neurological: He is alert and oriented to person, place, and time.  Skin: Skin is warm and dry.    ED Course  Procedures (including critical care time) Labs Review Labs Reviewed  BASIC METABOLIC PANEL - Abnormal; Notable for the following:    Potassium 5.3 (*)    Glucose, Bld 202 (*)    BUN 70 (*)    Creatinine, Ser 8.00 (*)     Calcium 7.9 (*)    GFR calc non Af Amer 6 (*)    GFR calc Af Amer 7 (*)    All other components within normal limits  CBC - Abnormal; Notable for the following:    RBC 3.44 (*)    Hemoglobin 10.4 (*)    HCT 32.2 (*)    Platelets 86 (*)    All other components within normal limits  PROTIME-INR - Abnormal; Notable for the following:    Prothrombin Time 15.6 (*)    All other components within normal limits  TROPONIN I - Abnormal; Notable for the following:    Troponin I 0.13 (*)    All other components within normal limits  GLUCOSE, CAPILLARY - Abnormal; Notable for the following:    Glucose-Capillary 141 (*)    All other components within normal limits  MRSA PCR SCREENING  TROPONIN I  TROPONIN I  HEMOGLOBIN 123XX123  BASIC METABOLIC PANEL  LIPID PANEL  I-STAT TROPOININ, ED    Imaging Review Dg Chest Portable 1 View  07/06/2015  CLINICAL DATA:  Chest pain EXAM: PORTABLE CHEST 1 VIEW COMPARISON:  12/30/2014 FINDINGS: Cardiac enlargement with prior CABG. Negative for heart failure. Lungs are clear without infiltrate or effusion. No mass lesion. IMPRESSION: No active disease. Electronically Signed   By: Franchot Gallo M.D.   On: 06/22/2015 17:58   I have personally reviewed and evaluated these images and lab results as part of my medical decision-making.   EKG Interpretation   Date/Time:  Monday Jul 05 2015 17:20:22 EDT Ventricular Rate:  68 PR Interval:  200 QRS Duration: 135 QT Interval:  427 QTC Calculation: [id number] R Axis:   57 Text Interpretation:  Sinus rhythm Probable left atrial enlargement LVH  with IVCD and secondary repol abnrm Inferolateral infarct, age  indeterminate Anterior ST elevation, probably due to LVH Baseline wander  in lead(s) V3 subtle ST changes compared to EKG in February 2017 Confirmed  by Rogene Houston  MD, [place] [phone]) on 06/26/2015 5:32:50 PM      MDM   Final diagnoses:  Chest pain, unspecified chest pain type  70 year old male with a history of  CAD status post CABG in 2001, stents, ESRD, presenting with chest pain. Says it feels like his prior MIs. Better after taking 324 mg aspirin at home and nitroglycerin. On arrival here the patient is well-appearing in no acute distress. Currently chest pain-free. EKG appears consistent with his prior. No new ischemic changes or interval abnormalities. Initial troponin is negative. Mild hyperkalemia; no EKG changes. Chest x-ray without evidence of pneumothorax or pneumonia. Description of symptoms is not consistent with aortic dissection.  Spoke with cardiology. We will admit to their service for further intervention and observation.  Maryan Puls, MD 07/06/15 [phone]

## 2015-07-05 NOTE — ED Notes (Signed)
Pt to ED via [facility] after reported having sudden onset of left chest pain.  St's he used 4 sprays of his NTG.  Also took 4 baby ASA.  Pt denies any nausea, vomiting, diaphoresis or shortness of breath.  On arrival ED pt denies any chest pain or other complaints.

## 2015-07-05 NOTE — ED Notes (Signed)
Cardiologist in to assess pt at this time.   

## 2015-07-05 NOTE — ED Provider Notes (Signed)
I saw and evaluated the patient, reviewed the resident's note and I agree with the findings and plan.   EKG Interpretation   Date/Time:  Monday Jul 05 2015 17:20:22 EDT Ventricular Rate:  68 PR Interval:  200 QRS Duration: 135 QT Interval:  427 QTC Calculation: [id number] R Axis:   57 Text Interpretation:  Sinus rhythm Probable left atrial enlargement LVH  with IVCD and secondary repol abnrm Inferolateral infarct, age  indeterminate Anterior ST elevation, probably due to LVH Baseline wander  in lead(s) V3 subtle ST changes compared to EKG in February 2017 Confirmed  by Rogene Houston  MD, [place] [phone]) on 06/27/2015 5:32:50 PM      Results for orders placed or performed during the hospital encounter of [id number]  Basic metabolic panel  Result Value Ref Range   Sodium 140 135 - 145 mmol/L   Potassium 5.3 (H) 3.5 - 5.1 mmol/L   Chloride 105 101 - 111 mmol/L   CO2 23 22 - 32 mmol/L   Glucose, Bld 202 (H) 65 - 99 mg/dL   BUN 70 (H) 6 - 20 mg/dL   Creatinine, Ser 8.00 (H) 0.61 - 1.24 mg/dL   Calcium 7.9 (L) 8.9 - 10.3 mg/dL   GFR calc non Af Amer 6 (L) >60 mL/min   GFR calc Af Amer 7 (L) >60 mL/min   Anion gap 12 5 - 15  CBC  Result Value Ref Range   WBC 5.6 4.0 - 10.5 K/uL   RBC 3.44 (L) 4.22 - 5.81 MIL/uL   Hemoglobin 10.4 (L) 13.0 - 17.0 g/dL   HCT 32.2 (L) 39.0 - 52.0 %   MCV 93.6 78.0 - 100.0 fL   MCH 30.2 26.0 - 34.0 pg   MCHC 32.3 30.0 - 36.0 g/dL   RDW 15.3 11.5 - 15.5 %   Platelets 86 (L) 150 - 400 K/uL  Protime-INR  Result Value Ref Range   Prothrombin Time 15.6 (H) 11.6 - 15.2 seconds   INR 1.22 0.00 - 1.49  I-stat troponin, ED (not at [facility])  Result Value Ref Range   Troponin i, poc 0.05 0.00 - 0.08 ng/mL   Comment 3           Dg Chest Portable 1 View  07/07/2015  CLINICAL DATA:  Chest pain EXAM: PORTABLE CHEST 1 VIEW COMPARISON:  12/30/2014 FINDINGS: Cardiac enlargement with prior CABG. Negative for heart failure. Lungs are clear without infiltrate or effusion. No  mass lesion. IMPRESSION: No active disease. Electronically Signed   By: Franchot Gallo M.D.   On: 06/13/2015 17:58    Patient seen by me along with the resident. Patient brought in by EMS. Patient with acute onset of left-sided chest pain used for his nitroglycerin sprays and took 4 baby aspirin. Upon arrival here chest pain was essentially completely resolved. In route EKG from EMS raise some concerns for an inferior MI. Upon arrival here though and repeat EKG showed no significant changes in his EKG other than some subtle ST segment changes inferiorly. Patient's first troponin was negative. Patient without significant anemia. Electrolytes without significant abnormalities other than potassium slightly elevated at 5.3. And patient BUN and creatinine elevated at 70 and 8.0. Patient has a history of renal insufficiency but this BUN and creatinine are little higher than usual. Patient is on dialysis normally dialyzed Tuesdays Thursdays and Saturdays.  Patient is now pain-free and comfortable. Lungs clear bilaterally. His AV fistulas in the left forearm. Abdomen somewhat distended but nontender.  Fredia Sorrow,  MD 06/19/2015 1935

## 2015-07-05 NOTE — ED Notes (Signed)
Pt placed into gown and on to monitor upon arrival to room. Pt monitored by blood pressure, pulse ox, and 12 lead. Pt EKG given to and signed by Dr. Rogene Houston.

## 2015-07-05 NOTE — H&P (Signed)
Cardiologist: Joseph [name] is an 70 y.o. male.   Chief Complaint: Chest pain  HPI: The patient is a 70yo male with a history of CABG and severe native coronary disease along with ischemic cardiomyopathy. CABG in 2001 at [facility], SVG-D2, SVG-OM1, SVG-right PDA, SVG-right PL1) and prior PCI, ischemic cardiomyopathy/chronic systolic CHF with EF 123XX123 by echo 08/06/2014, palpitations, CKD stage IV (previously treated with dialysis). --> Relook catheterization in July 2016 for recurrent angina showed critical left main disease with ostial circumflex disease. He had Synergy DES stent placed from left main into circumflex. Had patent LIMA-LAD with occluded RCA and graft to the RCA. All grafts besides LIMA were occluded.  -- Chest pain 08/26/2014 -> had palpitations and PACs/PAT with bradycardia but no VT on Holter monitor. Low risk VQ scan. Ranexa started that was uptitrated to a month later.  Hospital admission 03/09/2015: Admitted as a code STEMI with concern for anterior ST elevations. --> Heart cath revealed moderate in-stent restenosis of the ostial circumflex portion of the stent from June 2016. Severe elevated LVEDP noted. Symptoms were thought to be related to hypertensive emergency. Tensely AV fistula could contribute to ischemia from LIMA graft. --> There was a suggestion that his antihypertensives were not being taken properly.  Patient reports developing left-sided chest pain with some mild radiation to the right side of his jaw at approximately 1530 hrs. today.  This is his typical presentation. He tried 2 sprays of nitroglycerin and waited 5 minutes and then tried another 2 sprays with no improvement.  Pain was 7 out of 10.  While he was in the ambulance is pain subsided spontaneously without additional meds.  He denies any nausea, vomiting, shortness of breath, diaphoresis. He plays pickle ball 4 times per week and last week he had no chest pain during pickle ball however,  he did use his nitroglycerin occasionally last week during other times and it helped.   He also denies, fever, orthopnea, dizziness, PND, cough, congestion, abdominal pain, hematochezia, melena, lower extremity edema, claudication.   Diagnostic Diagram January 2017          Past Medical History  Diagnosis Date  . Hypertensive heart disease   . Hypercholesterolemia   . Prostate cancer ([place])   . Hepatosplenomegaly 2015    fibrosis, no cirrhosis on 06/2013 liver biopsy  . Thrombocytopenia ([place]) 2015  . Depression with anxiety   . IDDM (insulin dependent diabetes mellitus) ([place])     INSULIN DEPENDENT  . GERD (gastroesophageal reflux disease)   . History of shingles   . Ischemic cardiomyopathy     a. echo 08/06/2014: EF 25-30%, multiple WMA, mod DD, PASP 49 mm Hg;  b. 12/2014 Echo: EF 30-35%, diff HK, mild MR.; Echo 03/2015 - ? LV thrombus (not seen in 3/27 - but EF ? 40-45%.   . CKD (chronic kidney disease), stage IV ([place])     a. previously on dialysis 11/2013->04/2014;  b. 12/2014 Dialysis resumed.  . Coronary artery disease, occlusive     a. 2001 MI s/p 4v CABG (LIMA-LAD, SVG-D2, SVG-OM1, SVG-right PDA, SVG-right PL1); b. cath 07/30/2014 vein grafts all down, patent LIMA to LAD, significant LM and ost LCx dx; c. 08/07/2014 PCI Synergy DES 2.5 mm x 18 mm to dist LM and ost LCx, rec lifelong DAPT.; 02/2015 - Patent LIMA-LAD with L-R Collaterals, 60% ISR of LM-Cx stent (non-occlusive)  . Chronic combined systolic and diastolic CHF, NYHA class 2 ([place])     a.  08/2013 Echo: EF 40-45%, impaired relaxation;  b. 07/2014 Echo: EF 25-30%;  c. 12/2014 Echo: EF 30-35%, diff HK, mild MR.  . Gallstone pancreatitis 2015  . ESRD (end stage renal disease) [facility])     Past Surgical History  Procedure Laterality Date  . Cardiac surgery    . Shoulder arthroscopy    . Penile prosthesis implant    . Coronary angioplasty    . Coronary artery bypass graft  2001    LIMA-LAD, SVG-D2, SVG-OM 1, SVG-r PDA, SVG-r PL 1)  --> as of January 2017, all grafts besides LIMA occluded.  . Colonoscopy    . Liver biopsy    . Av fistula placement Left 2015  . Cardiac catheterization N/A 07/30/2014    Procedure: Left Heart Cath and Cors/Grafts Angiography;  Surgeon: Dionisio David, MD;  Location: [place] [facility];  Service: Cardiovascular;  Laterality: N/A;  . Cardiac catheterization N/A 08/07/2014    Procedure: Coronary Stent Intervention;  Surgeon: Lorretta Harp, MD;  Location: [place] [facility];  Service: Cardiovascular;  Left Main-Ostial Circumflex PCI with Synergy DES 2.5 mm x 18 mm  . Cardiac catheterization N/A 03/09/2015    Procedure: Left Heart Cath and Coronary Angiography;  Surgeon: Sherren Mocha, MD;  Location: [place] [facility];  Service: Cardiovascular: 100% oLAD & pRCA, ~60% ISR in oCx. Patent LIMA-LAD with L-R collaterals to rPDA  . Transthoracic echocardiogram  07/2014-04/2015    a. echo 08/06/2014: EF 25-30%, multiple WMA, mod DD, PASP 49 mm Hg;  b. 12/2014 Echo: EF 30-35%, diff HK, mild MR.; Echo 03/2015 - ? LV thrombus (not seen in 3/27 - but EF ? 40-45%.     Family History  Problem Relation Age of Onset  . Acute myelogenous leukemia Brother   . CAD Brother   . Diabetes Mellitus II Brother   . Heart disease Father 7    CABG   Social History:  reports that he quit smoking about 7 years ago. He has quit using smokeless tobacco. He reports that he does not drink alcohol or use illicit drugs.  Allergies:  Allergies  Allergen Reactions  . Sulfa Antibiotics Hives and Itching  . Morphine And Related     Makes patient feel weird     (Not in a hospital admission)  No results found for this or any previous visit (from the past 48 hour(s)). Dg Chest Portable 1 View  06/11/2015  CLINICAL DATA:  Chest pain EXAM: PORTABLE CHEST 1 VIEW COMPARISON:  12/30/2014 FINDINGS: Cardiac enlargement with prior CABG. Negative for heart failure. Lungs are clear without infiltrate or effusion. No mass lesion.  IMPRESSION: No active disease. Electronically Signed   By: Franchot Gallo M.D.   On: 06/20/2015 17:58    Review of Systems  Constitutional: Negative for fever and diaphoresis.  HENT: Negative for congestion and sore throat.   Respiratory: Negative for cough and shortness of breath.   Cardiovascular: Positive for chest pain. Negative for palpitations, orthopnea, leg swelling and PND.  Gastrointestinal: Negative for nausea, vomiting, abdominal pain, blood in stool and melena.  Genitourinary: Negative for dysuria.  Musculoskeletal: Negative for myalgias.  Neurological: Negative for dizziness.  All other systems reviewed and are negative.   Blood pressure 163/73, pulse 69, temperature 98.2 F (36.8 C), temperature source Oral, resp. rate 16, height 5\' 11"  (1.803 m), weight 205 lb (92.987 kg), SpO2 97 %. Physical Exam  Nursing note and vitals reviewed. Psychiatric: He has a normal mood and affect.  Well nourished, well developed, in no acute distress HEENT: Pupils are equal round react to light accommodation extraocular movements are intact.  Neck: no JVDNo cervical lymphadenopathy. Cardiac: Regular rate and rhythm with 2/6 diastolic MM. Lungs:  clear to auscultation bilaterally, no wheezing, rhonchi or rales Abd: soft, nontender, positive bowel sounds all quadrants, no hepatosplenomegaly Ext: no lower extremity edema.  2+ radial and dorsalis pedis pulses. Skin: warm and dry Neuro:  Grossly normal   Assessment/Plan Principal Problem:   Unstable angina (HCC) Active Problems:   Ischemic cardiomyopathy   Essential hypertension   CAD s/p CABG 2001, prior POBA, PCI/DES July 2016   Chronic combined systolic and diastolic heart failure (HCC)   ESRD (end stage renal disease) (HCC)   Dyslipidemia, goal LDL below 70  Joseph [name] symptoms are certainly concerning for unstable angina given the significance of his coronary disease. His wife reported that he had been using his nitroglycerin  more frequent in the last week and prior to that had rarely been using it.  However, the patient played pickle ball 4 times last week with no symptoms. Pain spontaneously resolved while in the ambulance. We will admit.  If he rules out for MI, DC home tomorrow. EKG shows no acute changes compared to prior. He does have some less than 1 mm ST elevation inferiorly.     Joseph Fuller, Joseph [name] 06/24/2015, 6:24 PM  As above, patient seen and examined; Briefly he is a 70 year old male  With past medical history of CAD, status post coronary artery bypass and graft, ischemic cardiomyopathy, end-stage renal disease with chest pain. Patient had his last PCI in July 2016. He was admitted in January 2017 and underwent cath which revealed moderate in-stent restenosis of his Lcx and elevated LVED. He has been treated medically. Patient developed soreness in his left upper chest followed by jaw tightness earlier today. He took nitroglycerin which typically improves his symptoms but did not today. His symptoms lasted for approximately one hour and he presented to the emergency room. Presently pain free. Note he does have chest pain occasionly which resolves with nitroglycerin.  Electrocardiogram shows sinus rhythm, Left ventricular hypertrophy with repol, inferior MI with slight ST elevation unchanged. Patient with both typical and atypical features. Plan rule out myocardial infarction with serial enzymes. If negative, given recent cath, would not pursue further ischemia eval. Would treat medically. If enzymes abnormal, would need cath. Kirk Ruths

## 2015-07-06 DIAGNOSIS — K7460 Unspecified cirrhosis of liver: Secondary | ICD-10-CM | POA: Diagnosis not present

## 2015-07-06 DIAGNOSIS — I25110 Atherosclerotic heart disease of native coronary artery with unstable angina pectoris: Secondary | ICD-10-CM | POA: Diagnosis not present

## 2015-07-06 DIAGNOSIS — Y838 Other surgical procedures as the cause of abnormal reaction of the patient, or of later complication, without mention of misadventure at the time of the procedure: Secondary | ICD-10-CM | POA: Diagnosis not present

## 2015-07-06 DIAGNOSIS — B371 Pulmonary candidiasis: Secondary | ICD-10-CM | POA: Diagnosis not present

## 2015-07-06 DIAGNOSIS — R162 Hepatomegaly with splenomegaly, not elsewhere classified: Secondary | ICD-10-CM | POA: Diagnosis present

## 2015-07-06 DIAGNOSIS — E1122 Type 2 diabetes mellitus with diabetic chronic kidney disease: Secondary | ICD-10-CM | POA: Diagnosis present

## 2015-07-06 DIAGNOSIS — R079 Chest pain, unspecified: Secondary | ICD-10-CM | POA: Diagnosis present

## 2015-07-06 DIAGNOSIS — J80 Acute respiratory distress syndrome: Secondary | ICD-10-CM | POA: Diagnosis not present

## 2015-07-06 DIAGNOSIS — J810 Acute pulmonary edema: Secondary | ICD-10-CM | POA: Diagnosis not present

## 2015-07-06 DIAGNOSIS — J9621 Acute and chronic respiratory failure with hypoxia: Secondary | ICD-10-CM | POA: Diagnosis not present

## 2015-07-06 DIAGNOSIS — Z9861 Coronary angioplasty status: Secondary | ICD-10-CM | POA: Diagnosis not present

## 2015-07-06 DIAGNOSIS — K7031 Alcoholic cirrhosis of liver with ascites: Secondary | ICD-10-CM | POA: Diagnosis not present

## 2015-07-06 DIAGNOSIS — I255 Ischemic cardiomyopathy: Secondary | ICD-10-CM | POA: Diagnosis not present

## 2015-07-06 DIAGNOSIS — I9789 Other postprocedural complications and disorders of the circulatory system, not elsewhere classified: Secondary | ICD-10-CM | POA: Diagnosis not present

## 2015-07-06 DIAGNOSIS — E785 Hyperlipidemia, unspecified: Secondary | ICD-10-CM | POA: Diagnosis not present

## 2015-07-06 DIAGNOSIS — J9620 Acute and chronic respiratory failure, unspecified whether with hypoxia or hypercapnia: Secondary | ICD-10-CM | POA: Diagnosis not present

## 2015-07-06 DIAGNOSIS — I214 Non-ST elevation (NSTEMI) myocardial infarction: Secondary | ICD-10-CM | POA: Diagnosis not present

## 2015-07-06 DIAGNOSIS — D65 Disseminated intravascular coagulation [defibrination syndrome]: Secondary | ICD-10-CM | POA: Diagnosis not present

## 2015-07-06 DIAGNOSIS — J9601 Acute respiratory failure with hypoxia: Secondary | ICD-10-CM | POA: Diagnosis not present

## 2015-07-06 DIAGNOSIS — K7200 Acute and subacute hepatic failure without coma: Secondary | ICD-10-CM | POA: Diagnosis not present

## 2015-07-06 DIAGNOSIS — I9751 Accidental puncture and laceration of a circulatory system organ or structure during a circulatory system procedure: Secondary | ICD-10-CM | POA: Diagnosis not present

## 2015-07-06 DIAGNOSIS — J9383 Other pneumothorax: Secondary | ICD-10-CM | POA: Diagnosis not present

## 2015-07-06 DIAGNOSIS — I2510 Atherosclerotic heart disease of native coronary artery without angina pectoris: Secondary | ICD-10-CM | POA: Diagnosis not present

## 2015-07-06 DIAGNOSIS — E7800 Pure hypercholesterolemia, unspecified: Secondary | ICD-10-CM | POA: Diagnosis present

## 2015-07-06 DIAGNOSIS — I110 Hypertensive heart disease with heart failure: Secondary | ICD-10-CM | POA: Diagnosis not present

## 2015-07-06 DIAGNOSIS — E874 Mixed disorder of acid-base balance: Secondary | ICD-10-CM | POA: Diagnosis not present

## 2015-07-06 DIAGNOSIS — R17 Unspecified jaundice: Secondary | ICD-10-CM | POA: Diagnosis not present

## 2015-07-06 DIAGNOSIS — R579 Shock, unspecified: Secondary | ICD-10-CM | POA: Diagnosis not present

## 2015-07-06 DIAGNOSIS — J189 Pneumonia, unspecified organism: Secondary | ICD-10-CM | POA: Diagnosis not present

## 2015-07-06 DIAGNOSIS — D696 Thrombocytopenia, unspecified: Secondary | ICD-10-CM | POA: Diagnosis not present

## 2015-07-06 DIAGNOSIS — A419 Sepsis, unspecified organism: Secondary | ICD-10-CM | POA: Diagnosis not present

## 2015-07-06 DIAGNOSIS — E872 Acidosis: Secondary | ICD-10-CM | POA: Diagnosis present

## 2015-07-06 DIAGNOSIS — I5042 Chronic combined systolic (congestive) and diastolic (congestive) heart failure: Secondary | ICD-10-CM | POA: Diagnosis not present

## 2015-07-06 DIAGNOSIS — Y95 Nosocomial condition: Secondary | ICD-10-CM | POA: Diagnosis not present

## 2015-07-06 DIAGNOSIS — R570 Cardiogenic shock: Secondary | ICD-10-CM | POA: Diagnosis not present

## 2015-07-06 DIAGNOSIS — A047 Enterocolitis due to Clostridium difficile: Secondary | ICD-10-CM | POA: Diagnosis not present

## 2015-07-06 DIAGNOSIS — J9311 Primary spontaneous pneumothorax: Secondary | ICD-10-CM | POA: Diagnosis not present

## 2015-07-06 DIAGNOSIS — E46 Unspecified protein-calorie malnutrition: Secondary | ICD-10-CM | POA: Diagnosis not present

## 2015-07-06 DIAGNOSIS — K567 Ileus, unspecified: Secondary | ICD-10-CM | POA: Diagnosis not present

## 2015-07-06 DIAGNOSIS — Y713 Surgical instruments, materials and cardiovascular devices (including sutures) associated with adverse incidents: Secondary | ICD-10-CM | POA: Diagnosis not present

## 2015-07-06 DIAGNOSIS — N179 Acute kidney failure, unspecified: Secondary | ICD-10-CM | POA: Diagnosis not present

## 2015-07-06 DIAGNOSIS — R6521 Severe sepsis with septic shock: Secondary | ICD-10-CM | POA: Diagnosis not present

## 2015-07-06 DIAGNOSIS — I132 Hypertensive heart and chronic kidney disease with heart failure and with stage 5 chronic kidney disease, or end stage renal disease: Secondary | ICD-10-CM | POA: Diagnosis not present

## 2015-07-06 DIAGNOSIS — S2612XA Laceration of heart without hemopericardium, initial encounter: Secondary | ICD-10-CM | POA: Diagnosis not present

## 2015-07-06 DIAGNOSIS — N186 End stage renal disease: Secondary | ICD-10-CM | POA: Diagnosis not present

## 2015-07-06 LAB — HEPARIN LEVEL (UNFRACTIONATED): HEPARIN UNFRACTIONATED: 0.21 [IU]/mL — AB (ref 0.30–0.70)

## 2015-07-06 LAB — GLUCOSE, CAPILLARY
GLUCOSE-CAPILLARY: 120 mg/dL — AB (ref 65–99)
GLUCOSE-CAPILLARY: 114 mg/dL — AB (ref 65–99)
Glucose-Capillary: 189 mg/dL — ABNORMAL HIGH (ref 65–99)

## 2015-07-06 LAB — LIPID PANEL
Cholesterol: 88 mg/dL (ref 0–200)
HDL: 26 mg/dL — AB (ref >40)
LDL CALC: 25 mg/dL (ref 0–99)
Total CHOL/HDL Ratio: 3.4 RATIO
Triglycerides: 184 mg/dL — ABNORMAL HIGH (ref <150)
VLDL: 37 mg/dL (ref 0–40)

## 2015-07-06 LAB — BASIC METABOLIC PANEL
ANION GAP: 12 (ref 5–15)
BUN: 70 mg/dL — AB (ref 6–20)
CALCIUM: 8 mg/dL — AB (ref 8.9–10.3)
CO2: 21 mmol/L — AB (ref 22–32)
CREATININE: 7.85 mg/dL — AB (ref 0.61–1.24)
Chloride: 106 mmol/L (ref 101–111)
GFR calc Af Amer: 7 mL/min — ABNORMAL LOW (ref >60)
GFR, EST NON AFRICAN AMERICAN: 6 mL/min — AB (ref >60)
GLUCOSE: 130 mg/dL — AB (ref 65–99)
Potassium: 4.3 mmol/L (ref 3.5–5.1)
Sodium: 139 mmol/L (ref 135–145)

## 2015-07-06 LAB — TROPONIN I
TROPONIN I: 0.18 ng/mL — AB (ref <0.031)
TROPONIN I: 0.26 ng/mL — AB (ref <0.031)

## 2015-07-06 LAB — MRSA PCR SCREENING: MRSA BY PCR: NEGATIVE

## 2015-07-06 MED ORDER — LIDOCAINE-PRILOCAINE 2.5-2.5 % EX CREA
1.0000 "application " | TOPICAL_CREAM | CUTANEOUS | Status: DC | PRN
  Filled 2015-07-06: qty 5

## 2015-07-06 MED ORDER — HEPARIN BOLUS VIA INFUSION
1000.0000 [IU] | Freq: Once | INTRAVENOUS | Status: AC
  Administered 2015-07-06: 1000 [IU] via INTRAVENOUS
  Filled 2015-07-06: qty 1000

## 2015-07-06 MED ORDER — SODIUM CHLORIDE 0.9% FLUSH
3.0000 mL | Freq: Two times a day (BID) | INTRAVENOUS | Status: DC
  Administered 2015-07-07: 3 mL via INTRAVENOUS

## 2015-07-06 MED ORDER — NITROGLYCERIN 0.4 MG SL SUBL
SUBLINGUAL_TABLET | SUBLINGUAL | Status: AC
  Filled 2015-07-06: qty 1

## 2015-07-06 MED ORDER — PENTAFLUOROPROP-TETRAFLUOROETH EX AERO: 1.0000 "application " | INHALATION_SPRAY | CUTANEOUS | Status: DC | PRN

## 2015-07-06 MED ORDER — ALTEPLASE 2 MG IJ SOLR: 2.0000 mg | Freq: Once | INTRAMUSCULAR | Status: DC | PRN

## 2015-07-06 MED ORDER — HEPARIN SODIUM (PORCINE) 1000 UNIT/ML DIALYSIS: 1000.0000 [IU] | INTRAMUSCULAR | Status: DC | PRN

## 2015-07-06 MED ORDER — SODIUM CHLORIDE 0.9 % IV SOLN: 100.0000 mL | INTRAVENOUS | Status: DC | PRN

## 2015-07-06 MED ORDER — LIDOCAINE HCL (PF) 1 % IJ SOLN: 5.0000 mL | INTRAMUSCULAR | Status: DC | PRN

## 2015-07-06 MED ORDER — SODIUM CHLORIDE 0.9 % IV SOLN
INTRAVENOUS | Status: DC
Start: 2015-07-07 — End: 2015-07-07
  Administered 2015-07-07: 06:00:00 via INTRAVENOUS

## 2015-07-06 MED ORDER — ISOSORBIDE MONONITRATE ER 30 MG PO TB24
90.0000 mg | ORAL_TABLET | Freq: Every day | ORAL | Status: DC
  Administered 2015-07-07 – 2015-07-14 (×7): 90 mg via ORAL
  Filled 2015-07-06 (×8): qty 1

## 2015-07-06 MED ORDER — SODIUM CHLORIDE 0.9 % IV SOLN: 250.0000 mL | INTRAVENOUS | Status: DC | PRN

## 2015-07-06 MED ORDER — HEPARIN (PORCINE) IN NACL 100-0.45 UNIT/ML-% IJ SOLN
1700.0000 [IU]/h | INTRAMUSCULAR | Status: DC
  Administered 2015-07-07: 1500 [IU]/h via INTRAVENOUS
  Filled 2015-07-06 (×2): qty 250

## 2015-07-06 MED ORDER — HEPARIN BOLUS VIA INFUSION
4000.0000 [IU] | Freq: Once | INTRAVENOUS | Status: AC
  Administered 2015-07-06: 4000 [IU] via INTRAVENOUS
  Filled 2015-07-06: qty 4000

## 2015-07-06 MED ORDER — SODIUM CHLORIDE 0.9% FLUSH: 3.0000 mL | INTRAVENOUS | Status: DC | PRN

## 2015-07-06 NOTE — Care Management Obs Status (Signed)
[place] NOTIFICATION   Patient Details  Name: Joseph [name] MRN: [id number] Date of Birth: 1945-02-28   Medicare Observation Status Notification Given:  Yes    Bethena Roys, RN 07/06/2015, 10:53 AM

## 2015-07-06 NOTE — Consult Note (Signed)
Renal Service Consult Note [facility]  Joseph [name] 07/06/2015 [place] Requesting Physician:  Dr. Ellyn Hack  Reason for Consult:  ESRD pt w NSTEMI HPI: The patient is a 70 y.o. year-old with history of CAD/ CABG 2001 w subsequent stent(s), ESRD on HD TTS, presented last night with chest pain.  In July 2016 had critical L main and LCX disease treated w L main/LCX stenting; all grafts besides LIMA were occluded.  This January had code STEMI w anterior ST elevatiaon, cath showed ISR of LCX portion of stent placed last July, very high LVEDP.  Symptoms thought to be due to vol overload/ HTN crisis.    Now patient here with chest pain.  Trop slightly ^.  Plan is for HD today and heart cath tomorrow.  Asked to see for HD.    Patient feels ok now, no active CP.  No SOB, no recent HD issues.      ROS  no joint pain   no HA  no blurry vision  no rash  no diarrhea  no nausea/ vomiting  no dysuria  no difficulty voiding  no change in urine color    Past Medical History  Past Medical History  Diagnosis Date  . Hypertensive heart disease   . Hypercholesterolemia   . Prostate cancer ([place])   . Hepatosplenomegaly 2015    fibrosis, no cirrhosis on 06/2013 liver biopsy  . Thrombocytopenia ([place]) 2015  . Depression with anxiety   . IDDM (insulin dependent diabetes mellitus) ([place])     INSULIN DEPENDENT  . GERD (gastroesophageal reflux disease)   . History of shingles   . Ischemic cardiomyopathy     a. echo 08/06/2014: EF 25-30%, multiple WMA, mod DD, PASP 49 mm Hg;  b. 12/2014 Echo: EF 30-35%, diff HK, mild MR.; Echo 03/2015 - ? LV thrombus (not seen in 3/27 - but EF ? 40-45%.   . CKD (chronic kidney disease), stage IV ([place])     a. previously on dialysis 11/2013->04/2014;  b. 12/2014 Dialysis resumed.  . Coronary artery disease, occlusive     a. 2001 MI s/p 4v CABG (LIMA-LAD, SVG-D2, SVG-OM1, SVG-right PDA, SVG-right PL1); b. cath 07/30/2014 vein grafts all down, patent  LIMA to LAD, significant LM and ost LCx dx; c. 08/07/2014 PCI Synergy DES 2.5 mm x 18 mm to dist LM and ost LCx, rec lifelong DAPT.; 02/2015 - Patent LIMA-LAD with L-R Collaterals, 60% ISR of LM-Cx stent (non-occlusive)  . Chronic combined systolic and diastolic CHF, NYHA class 2 ([place])     a. 08/2013 Echo: EF 40-45%, impaired relaxation;  b. 07/2014 Echo: EF 25-30%;  c. 12/2014 Echo: EF 30-35%, diff HK, mild MR.  . Gallstone pancreatitis 2015  . ESRD (end stage renal disease) [facility])    Past Surgical History  Past Surgical History  Procedure Laterality Date  . Cardiac surgery    . Shoulder arthroscopy    . Penile prosthesis implant    . Coronary angioplasty    . Coronary artery bypass graft  2001    LIMA-LAD, SVG-D2, SVG-OM 1, SVG-r PDA, SVG-r PL 1) --> as of January 2017, all grafts besides LIMA occluded.  . Colonoscopy    . Liver biopsy    . Av fistula placement Left 2015  . Cardiac catheterization N/A 07/30/2014    Procedure: Left Heart Cath and Cors/Grafts Angiography;  Surgeon: Dionisio David, MD;  Location: [facility];  Service: Cardiovascular;  Laterality: N/A;  . Cardiac catheterization  N/A 08/07/2014    Procedure: Coronary Stent Intervention;  Surgeon: Lorretta Harp, MD;  Location: [facility];  Service: Cardiovascular;  Left Main-Ostial Circumflex PCI with Synergy DES 2.5 mm x 18 mm  . Cardiac catheterization N/A 03/09/2015    Procedure: Left Heart Cath and Coronary Angiography;  Surgeon: Sherren Mocha, MD;  Location: [facility];  Service: Cardiovascular: 100% oLAD & pRCA, ~60% ISR in oCx. Patent LIMA-LAD with L-R collaterals to rPDA  . Transthoracic echocardiogram  07/2014-04/2015    a. echo 08/06/2014: EF 25-30%, multiple WMA, mod DD, PASP 49 mm Hg;  b. 12/2014 Echo: EF 30-35%, diff HK, mild MR.; Echo 03/2015 - ? LV thrombus (not seen in 3/27 - but EF ? 40-45%.    Family History  Family History  Problem Relation Age of Onset  . Acute myelogenous leukemia  Brother   . CAD Brother   . Diabetes Mellitus II Brother   . Heart disease Father 62    CABG   Social History  reports that he quit smoking about 7 years ago. He has quit using smokeless tobacco. He reports that he does not drink alcohol or use illicit drugs. Allergies  Allergies  Allergen Reactions  . Sulfa Antibiotics Hives and Itching  . Morphine And Related     Makes patient feel weird   Home medications Prior to Admission medications   Medication Sig Start Date End Date Taking? Authorizing Provider  aspirin EC 81 MG tablet Take 81 mg by mouth daily.   Yes Historical Provider, MD  calcium acetate (PHOSLO) 667 MG tablet Take 1,334 mg by mouth 3 (three) times daily with meals.  05/21/15  Yes Historical Provider, MD  carvedilol (COREG) 6.25 MG tablet Take 1 tablet (6.25 mg total) by mouth 2 (two) times daily with a meal. 03/11/15  Yes Brittainy Erie Noe, PA-C  furosemide (LASIX) 80 MG tablet Take 80 mg by mouth as needed for fluid.    Yes Historical Provider, MD  isosorbide mononitrate (IMDUR) 30 MG 24 hr tablet Take 30 mg by mouth daily. 06/24/15  Yes Historical Provider, MD  isosorbide mononitrate (IMDUR) 60 MG 24 hr tablet Take 60 mg by mouth daily.  04/01/15  Yes Historical Provider, MD  LANTUS SOLOSTAR 100 UNIT/ML Solostar Pen Inject 20 Units into the skin daily at 10 pm.  04/18/15  Yes Historical Provider, MD  lidocaine-prilocaine (EMLA) cream Apply 1 application topically Every Tuesday,Thursday,and Saturday with dialysis.  01/27/15  Yes Historical Provider, MD  naproxen sodium (ANAPROX) 220 MG tablet Take 220 mg by mouth 2 (two) times daily with a meal.   Yes Historical Provider, MD  nitroGLYCERIN (NITROLINGUAL) 0.4 MG/SPRAY spray Place 1 spray under the tongue every 5 (five) minutes x 3 doses as needed for chest pain. Do not use together with sublingual nitro 08/08/14  Yes Almyra Deforest, PA  NOVOLOG MIX 70/30 FLEXPEN (70-30) 100 UNIT/ML FlexPen Inject 25 Units into the skin daily with  breakfast.  05/06/15  Yes Historical Provider, MD  pregabalin (LYRICA) 75 MG capsule Take 1 capsule (75 mg total) by mouth daily. Patient taking differently: Take 75 mg by mouth 2 (two) times daily.  01/04/15  Yes Nita Sells, MD  ranolazine (RANEXA) 500 MG 12 hr tablet Take 1 tablet (500 mg total) by mouth 2 (two) times daily. 02/25/15  Yes Rogelia Mire, NP  rosuvastatin (CRESTOR) 20 MG tablet Take 1 tablet (20 mg total) by mouth at bedtime. 02/25/15  Yes Lisette Grinder  Sharolyn Douglas, NP  ticagrelor (BRILINTA) 90 MG TABS tablet Take 1 tablet (90 mg total) by mouth 2 (two) times daily. 12/11/14  Yes Brett Canales, PA-C   Liver Function Tests No results for input(s): AST, ALT, ALKPHOS, BILITOT, PROT, ALBUMIN in the last 168 hours. No results for input(s): LIPASE, AMYLASE in the last 168 hours. CBC  Recent Labs Lab 06/21/2015 1814  WBC 5.6  HGB 10.4*  HCT 32.2*  MCV 93.6  PLT 86*   Basic Metabolic Panel  Recent Labs Lab 06/15/2015 1814 07/06/15 0317  NA 140 139  K 5.3* 4.3  CL 105 106  CO2 23 21*  GLUCOSE 202* 130*  BUN 70* 70*  CREATININE 8.00* 7.85*  CALCIUM 7.9* 8.0*    Filed Vitals:   06/21/2015 2052 07/06/15 0017 07/06/15 0022 07/06/15 0454  BP: 152/62 168/98 175/74 161/63  Pulse: 58   58  Temp: 97.7 F (36.5 C)   97.6 F (36.4 C)  TempSrc: Oral   Oral  Resp: 18 18 18 16   Height: 5\' 11"  (1.803 m)     Weight: 97.841 kg (215 lb 11.2 oz)   97.796 kg (215 lb 9.6 oz)  SpO2: 99% 100% 100% 98%   Exam Alert, no distress No rash, cyanosis or gangrene Sclera anicteric, throat clear  No jvd or bruits Chest clear bilat RRR no MRG Abd soft ntnd no mass or ascites +bs GU normal male MS no joint effusions or deformity Ext no LE or UE edema / no wounds or ulcers Neuro is alert, Ox 3 , nf LFA AVF +bruit  CXR - no active disease  Dialysis: [facility]  Dry wt 215 lbs   Assessment: 1. Chest pain / NSTEMI - for cath tomorrow 2. ESRD TTS hd 3. Volume - is at his dry  wt , no vol excess on exam 4. CAD/ hx CABG '01/ L main+LCX stent Jul '16 5. DM insulin 6. Chron low plts 7. 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 ICM EF 25-30%   Plan - HD today, UF 1-2 kg  Kelly Splinter MD [facility] pager [phone]    cell [phone] 07/06/2015, 11:46 AM

## 2015-07-06 NOTE — Progress Notes (Signed)
Patient Name: Joseph [name] Date of Encounter: 07/06/2015  Hospital Problem List     Principal Problem:   NSTEMI (non-ST elevated myocardial infarction) [facility]) Active Problems:   Ischemic cardiomyopathy   CAD s/p CABG 2001, prior POBA, PCI/DES July 2016   Chronic combined systolic and diastolic heart failure ([place])   ESRD (end stage renal disease) ([place])   Type 2 diabetes mellitus with renal manifestations ([place])   Hypertensive heart disease with heart failure (HCC)   Dyslipidemia, goal LDL below 70    Subjective   Had another episode of chest pain overnight.  Relieved with nitro.  ECG @ that time not acutely changed.  Pain described as left upper chest sore feeling with radiation to the jaw - similar to prior angina.  Inpatient Medications    . aspirin EC  81 mg Oral Daily  . calcium acetate  1,334 mg Oral TID WC  . carvedilol  6.25 mg Oral BID WC  . insulin aspart  0-15 Units Subcutaneous TID WC  . insulin glargine  20 Units Subcutaneous QHS  . isosorbide mononitrate  60 mg Oral Daily  . pregabalin  75 mg Oral BID  . ranolazine  500 mg Oral BID  . rosuvastatin  20 mg Oral QHS  . ticagrelor  90 mg Oral BID    Vital Signs    Filed Vitals:   07/04/2015 2052 07/06/15 0017 07/06/15 0022 07/06/15 0454  BP: 152/62 168/98 175/74 161/63  Pulse: 58   58  Temp: 97.7 F (36.5 C)   97.6 F (36.4 C)  TempSrc: Oral   Oral  Resp: 18 18 18 16   Height: 5\' 11"  (1.803 m)     Weight: 215 lb 11.2 oz (97.841 kg)   215 lb 9.6 oz (97.796 kg)  SpO2: 99% 100% 100% 98%    Intake/Output Summary (Last 24 hours) at 07/06/15 0825 Last data filed at 07/06/15 0743  Gross per 24 hour  Intake      0 ml  Output   1400 ml  Net  -1400 ml   Filed Weights   06/24/2015 1720 06/24/2015 2052 07/06/15 0454  Weight: 205 lb (92.987 kg) 215 lb 11.2 oz (97.841 kg) 215 lb 9.6 oz (97.796 kg)    Physical Exam    General: Pleasant, NAD. Neuro: Alert and oriented X 3. Moves all extremities  spontaneously. Psych: Normal affect. HEENT:  Normal  Neck: Supple without bruits or JVD. Lungs:  Resp regular and unlabored, CTA. Heart: RRR no s3, s4, or murmurs. Abdomen: Soft, non-tender, non-distended, BS + x 4.  Extremities: No clubbing, cyanosis or edema. DP/PT/Radials 2+ and equal bilaterally.  Labs    CBC  Recent Labs  06/29/2015 1814  WBC 5.6  HGB 10.4*  HCT 32.2*  MCV 93.6  PLT 86*   Basic Metabolic Panel  Recent Labs  06/08/2015 1814 07/06/15 0317  NA 140 139  K 5.3* 4.3  CL 105 106  CO2 23 21*  GLUCOSE 202* 130*  BUN 70* 70*  CREATININE 8.00* 7.85*  CALCIUM 7.9* 8.0*   Cardiac Enzymes  Recent Labs  06/14/2015 2110 07/06/15 0317  TROPONINI 0.13* 0.18*   Fasting Lipid Panel  Recent Labs  07/06/15 0317  CHOL 88  HDL 26*  LDLCALC 25  TRIG 184*  CHOLHDL 3.4    Telemetry    RSR  ECG    RSR, 60, 1st deg AVB, LAE, inf infarct w/ inf ST elevation, lat twi - unchanged.  Radiology  Dg Chest Portable 1 View  06/11/2015  CLINICAL DATA:  Chest pain EXAM: PORTABLE CHEST 1 VIEW COMPARISON:  12/30/2014 FINDINGS: Cardiac enlargement with prior CABG. Negative for heart failure. Lungs are clear without infiltrate or effusion. No mass lesion. IMPRESSION: No active disease. Electronically Signed   By: Franchot Gallo M.D.   On: 07/06/2015 17:58    Assessment & Plan    1.  NSTEMI/CAD:  S/p prior CABG with subsequent stenting of the native LM/LCX in 07/2014 and cath in 02/2015 revealing mod ISR with otw diffuse occlusive dzs(patent LIMA  LAD, otw 4 occluded grafts, occluded LAD/RCA).  Pt presented with recurrent nitrate responsive chest pain.  His troponins are mildly abnl, 0.18 this AM.  He had more nitrate responsive c/p over night.  Will plan on dialysis today and cath tomorrow.  The patient understands that risks include but are not limited to stroke (1 in 1000), death (1 in 43), kidney failure [usually temporary] (1 in 500), bleeding (1 in 200), allergic  reaction [possibly serious] (1 in 200), and agrees to proceed.  Cont asa, brilinta,  blocker, nitrate, statin, ranexa.  BP trending high.  Titrate nitrate.  2.  ESRD:  TTS dialysis @ [facility] Guy Sandifer, MD).  Will call renal today for dialysis today.  3.  Hypertensive Heart Disease:  BP trending high.  Will titrate nitrate.  Follow.  4.  HL:  LDL 25.  Cont crestor.  5.  Type II DM: cont insulin.  Signed, Murray Hodgkins NP  As above, patient seen and examined. He had recurrent chest pain last evening which resolved with nitroglycerin. Given persistence of symptoms and progressive nature we will need to proceed with cardiac catheterization for definitive evaluation. The risks and benefits were discussed and he agrees to proceed. We have arranged for tomorrow. He will need dialysis today and we will contact nephrology. Continue present medications. Add IV heparin.  Kirk Ruths

## 2015-07-06 NOTE — Progress Notes (Signed)
ANTICOAGULATION CONSULT NOTE - Follow Up Consult  Pharmacy Consult for heparin Indication: NSTEMI  Allergies  Allergen Reactions  . Sulfa Antibiotics Hives and Itching  . Morphine And Related     Makes patient feel weird    Patient Measurements: Height: 5\' 11"  (180.3 cm) Weight: 211 lb 3.2 oz (95.8 kg) IBW/kg (Calculated) : 75.3 Heparin Dosing Weight: 95 kg  Vital Signs: Temp: 97.9 F (36.6 C) (05/30 2102) Temp Source: Oral (05/30 2102) BP: 143/84 mmHg (05/30 2102) Pulse Rate: 71 (05/30 2102)  Labs:  Recent Labs  07/06/2015 1814 06/29/2015 2110 07/06/15 0317 07/06/15 0841 07/06/15 2056  HGB 10.4*  --   --   --   --   HCT 32.2*  --   --   --   --   PLT 86*  --   --   --   --   LABPROT 15.6*  --   --   --   --   INR 1.22  --   --   --   --   HEPARINUNFRC  --   --   --   --  0.21*  CREATININE 8.00*  --  7.85*  --   --   TROPONINI  --  0.13* 0.18* 0.26*  --     Estimated Creatinine Clearance: 10.8 mL/min (by C-G formula based on Cr of 7.85).   Medications:  Infusions:  . heparin 1,300 Units/hr (07/06/15 1032)    Assessment: 70 yo M with known CAD and persistent CP now with slight troponin increase. He was started on heparin drip today. Plan is for cath tomorrow. ESRD with HD TTS - had HD today.  HL is subtherapeutic at 0.21 on 1300 units/hr. No bleeding noted. No problems noted per RN.  Goal of Therapy:  Heparin level 0.3-0.7 units/ml Monitor platelets by anticoagulation protocol: Yes   Plan:  - Heparin 1000 units IV bolus then increase to 1500 units/hr - Daily HL, CBC - Monitor for s/sx of bleeding  [facility], Pharm.D., BCPS Clinical Pharmacist Pager: [phone] 07/06/2015 9:45 PM

## 2015-07-06 NOTE — Progress Notes (Addendum)
ANTICOAGULATION CONSULT NOTE - Initial Consult  Pharmacy Consult for Heparin  Indication: NSTEMI  Allergies  Allergen Reactions  . Sulfa Antibiotics Hives and Itching  . Morphine And Related     Makes patient feel weird    Patient Measurements: Height: 5\' 11"  (180.3 cm) Weight: 215 lb 9.6 oz (97.796 kg) IBW/kg (Calculated) : 75.3 Heparin Dosing Weight: 95.2 kg  Vital Signs: Temp: 97.6 F (36.4 C) (05/30 0454) Temp Source: Oral (05/30 0454) BP: 161/63 mmHg (05/30 0454) Pulse Rate: 58 (05/30 0454)  Labs:  Recent Labs  06/10/2015 1814 06/09/2015 2110 07/06/15 0317  HGB 10.4*  --   --   HCT 32.2*  --   --   PLT 86*  --   --   LABPROT 15.6*  --   --   INR 1.22  --   --   CREATININE 8.00*  --  7.85*  TROPONINI  --  0.13* 0.18*    Estimated Creatinine Clearance: 10.9 mL/min (by C-G formula based on Cr of 7.85).   Medical History: Past Medical History  Diagnosis Date  . Hypertensive heart disease   . Hypercholesterolemia   . Prostate cancer ([place])   . Hepatosplenomegaly 2015    fibrosis, no cirrhosis on 06/2013 liver biopsy  . Thrombocytopenia ([place]) 2015  . Depression with anxiety   . IDDM (insulin dependent diabetes mellitus) ([place])     INSULIN DEPENDENT  . GERD (gastroesophageal reflux disease)   . History of shingles   . Ischemic cardiomyopathy     a. echo 08/06/2014: EF 25-30%, multiple WMA, mod DD, PASP 49 mm Hg;  b. 12/2014 Echo: EF 30-35%, diff HK, mild MR.; Echo 03/2015 - ? LV thrombus (not seen in 3/27 - but EF ? 40-45%.   . CKD (chronic kidney disease), stage IV ([place])     a. previously on dialysis 11/2013->04/2014;  b. 12/2014 Dialysis resumed.  . Coronary artery disease, occlusive     a. 2001 MI s/p 4v CABG (LIMA-LAD, SVG-D2, SVG-OM1, SVG-right PDA, SVG-right PL1); b. cath 07/30/2014 vein grafts all down, patent LIMA to LAD, significant LM and ost LCx dx; c. 08/07/2014 PCI Synergy DES 2.5 mm x 18 mm to dist LM and ost LCx, rec lifelong DAPT.; 02/2015 - Patent  LIMA-LAD with L-R Collaterals, 60% ISR of LM-Cx stent (non-occlusive)  . Chronic combined systolic and diastolic CHF, NYHA class 2 ([place])     a. 08/2013 Echo: EF 40-45%, impaired relaxation;  b. 07/2014 Echo: EF 25-30%;  c. 12/2014 Echo: EF 30-35%, diff HK, mild MR.  . Gallstone pancreatitis 2015  . ESRD (end stage renal disease) ([place])     Medications:  Scheduled:  . aspirin EC  81 mg Oral Daily  . calcium acetate  1,334 mg Oral TID WC  . carvedilol  6.25 mg Oral BID WC  . insulin aspart  0-15 Units Subcutaneous TID WC  . insulin glargine  20 Units Subcutaneous QHS  . [START ON 07/06/2015] isosorbide mononitrate  90 mg Oral Daily  . pregabalin  75 mg Oral BID  . ranolazine  500 mg Oral BID  . rosuvastatin  20 mg Oral QHS  . ticagrelor  90 mg Oral BID   Infusions:    Assessment: 70 yo M with known CAD and persistent CP.  Now with slight troponin increase 0.13 > 0.18.  Plan to start heparin and cardiac cath 5/31.  ESRD with HD TTS - plans for HD today.  Goal of Therapy:  Heparin level 0.3-0.7  units/ml Monitor platelets by anticoagulation protocol: Yes   Plan:  Heparin 4000 units bolus Heparin infusion at 1300 units/hr Heparin level in 8 hours Heparin level and CBC daily while on heparin  [employer], Pharm.D., BCPS Clinical Pharmacist Pager [phone] 07/06/2015 10:02 AM

## 2015-07-06 NOTE — Progress Notes (Signed)
At [facility] pt c/o left chest pain and jaw pain 7/10, which woke him up from sleeping.  EKG is unchanged from previous EKG.  Pain relieved after 1 SL NTG and 4L of O2 via .  Troponin 0.13.  Dr. Eula Fried paged regarding above events and results.  Will continue to monitor.  Jodell Cipro

## 2015-07-07 ENCOUNTER — Encounter (HOSPITAL_COMMUNITY): Payer: Self-pay | Admitting: Interventional Cardiology

## 2015-07-07 ENCOUNTER — Encounter (HOSPITAL_COMMUNITY): Admission: EM | Disposition: E | Payer: Self-pay | Source: Home / Self Care | Attending: Cardiothoracic Surgery

## 2015-07-07 DIAGNOSIS — I25110 Atherosclerotic heart disease of native coronary artery with unstable angina pectoris: Secondary | ICD-10-CM

## 2015-07-07 DIAGNOSIS — N186 End stage renal disease: Secondary | ICD-10-CM

## 2015-07-07 DIAGNOSIS — I2510 Atherosclerotic heart disease of native coronary artery without angina pectoris: Secondary | ICD-10-CM

## 2015-07-07 DIAGNOSIS — E785 Hyperlipidemia, unspecified: Secondary | ICD-10-CM

## 2015-07-07 DIAGNOSIS — R079 Chest pain, unspecified: Secondary | ICD-10-CM | POA: Insufficient documentation

## 2015-07-07 DIAGNOSIS — I110 Hypertensive heart disease with heart failure: Secondary | ICD-10-CM

## 2015-07-07 HISTORY — PX: CARDIAC CATHETERIZATION: SHX172

## 2015-07-07 LAB — GLUCOSE, CAPILLARY
GLUCOSE-CAPILLARY: 126 mg/dL — AB (ref 65–99)
GLUCOSE-CAPILLARY: 147 mg/dL — AB (ref 65–99)
GLUCOSE-CAPILLARY: 103 mg/dL — AB (ref 65–99)
GLUCOSE-CAPILLARY: 184 mg/dL — AB (ref 65–99)
Glucose-Capillary: 106 mg/dL — ABNORMAL HIGH (ref 65–99)
Glucose-Capillary: 176 mg/dL — ABNORMAL HIGH (ref 65–99)

## 2015-07-07 LAB — HEPARIN LEVEL (UNFRACTIONATED): Heparin Unfractionated: 0.17 IU/mL — ABNORMAL LOW (ref 0.30–0.70)

## 2015-07-07 LAB — CBC
HCT: 34 % — ABNORMAL LOW (ref 39.0–52.0)
Hemoglobin: 11.1 g/dL — ABNORMAL LOW (ref 13.0–17.0)
MCH: 30.6 pg (ref 26.0–34.0)
MCHC: 32.6 g/dL (ref 30.0–36.0)
MCV: 93.7 fL (ref 78.0–100.0)
PLATELETS: 81 10*3/uL — AB (ref 150–400)
RBC: 3.63 MIL/uL — AB (ref 4.22–5.81)
RDW: 15.1 % (ref 11.5–15.5)
WBC: 5.2 10*3/uL (ref 4.0–10.5)

## 2015-07-07 LAB — HEMOGLOBIN A1C
HEMOGLOBIN A1C: 6.9 % — AB (ref 4.8–5.6)
MEAN PLASMA GLUCOSE: 151 mg/dL

## 2015-07-07 LAB — HEPATITIS B SURFACE ANTIGEN: HEP B S AG: NEGATIVE

## 2015-07-07 SURGERY — LEFT HEART CATH AND CORS/GRAFTS ANGIOGRAPHY

## 2015-07-07 MED ORDER — SODIUM CHLORIDE 0.9 % IV SOLN
250.0000 mL | INTRAVENOUS | Status: DC | PRN
  Administered 2015-07-15 (×2): via INTRAVENOUS

## 2015-07-07 MED ORDER — ONDANSETRON HCL 4 MG/2ML IJ SOLN: 4.0000 mg | Freq: Four times a day (QID) | INTRAMUSCULAR | Status: DC | PRN

## 2015-07-07 MED ORDER — HEPARIN (PORCINE) IN NACL 2-0.9 UNIT/ML-% IJ SOLN
INTRAMUSCULAR | Status: DC | PRN
  Administered 2015-07-07: 1500 mL

## 2015-07-07 MED ORDER — SODIUM CHLORIDE 0.9% FLUSH: 3.0000 mL | INTRAVENOUS | Status: DC | PRN

## 2015-07-07 MED ORDER — FENTANYL CITRATE (PF) 100 MCG/2ML IJ SOLN
INTRAMUSCULAR | Status: DC | PRN
  Administered 2015-07-07: 50 ug via INTRAVENOUS

## 2015-07-07 MED ORDER — FENTANYL CITRATE (PF) 100 MCG/2ML IJ SOLN
INTRAMUSCULAR | Status: AC
  Filled 2015-07-07: qty 2

## 2015-07-07 MED ORDER — IOPAMIDOL (ISOVUE-370) INJECTION 76%
INTRAVENOUS | Status: AC
  Filled 2015-07-07: qty 125

## 2015-07-07 MED ORDER — MIDAZOLAM HCL 2 MG/2ML IJ SOLN
INTRAMUSCULAR | Status: AC
  Filled 2015-07-07: qty 2

## 2015-07-07 MED ORDER — LIDOCAINE HCL (PF) 1 % IJ SOLN
INTRAMUSCULAR | Status: DC | PRN
  Administered 2015-07-07: 17 mL

## 2015-07-07 MED ORDER — IOPAMIDOL (ISOVUE-370) INJECTION 76%
INTRAVENOUS | Status: DC | PRN
  Administered 2015-07-07: 90 mL via INTRA_ARTERIAL

## 2015-07-07 MED ORDER — SODIUM CHLORIDE 0.9% FLUSH
3.0000 mL | Freq: Two times a day (BID) | INTRAVENOUS | Status: DC
  Administered 2015-07-07 – 2015-07-11 (×4): 3 mL via INTRAVENOUS

## 2015-07-07 MED ORDER — LIDOCAINE HCL (PF) 1 % IJ SOLN
INTRAMUSCULAR | Status: AC
  Filled 2015-07-07: qty 30

## 2015-07-07 MED ORDER — SODIUM CHLORIDE 0.9% FLUSH
3.0000 mL | Freq: Two times a day (BID) | INTRAVENOUS | Status: DC
  Administered 2015-07-08 – 2015-07-14 (×6): 3 mL via INTRAVENOUS

## 2015-07-07 MED ORDER — HEPARIN (PORCINE) IN NACL 100-0.45 UNIT/ML-% IJ SOLN
1700.0000 [IU]/h | INTRAMUSCULAR | Status: DC
  Administered 2015-07-07 – 2015-07-08 (×2): 1750 [IU]/h via INTRAVENOUS
  Administered 2015-07-09: 1700 [IU]/h via INTRAVENOUS
  Administered 2015-07-09: 1750 [IU]/h via INTRAVENOUS
  Administered 2015-07-11 – 2015-07-15 (×5): 1700 [IU]/h via INTRAVENOUS
  Filled 2015-07-07 (×11): qty 250

## 2015-07-07 MED ORDER — MIDAZOLAM HCL 2 MG/2ML IJ SOLN
INTRAMUSCULAR | Status: DC | PRN
  Administered 2015-07-07 (×2): 1 mg via INTRAVENOUS

## 2015-07-07 MED ORDER — ACETAMINOPHEN 325 MG PO TABS
650.0000 mg | ORAL_TABLET | ORAL | Status: DC | PRN
  Administered 2015-07-14: 650 mg via ORAL

## 2015-07-07 MED ORDER — HEPARIN (PORCINE) IN NACL 2-0.9 UNIT/ML-% IJ SOLN
INTRAMUSCULAR | Status: AC
  Filled 2015-07-07: qty 1500

## 2015-07-07 MED ORDER — SODIUM CHLORIDE 0.9 % IV SOLN
250.0000 mL | INTRAVENOUS | Status: DC | PRN
  Administered 2015-07-15 (×3): via INTRAVENOUS

## 2015-07-07 SURGICAL SUPPLY — 8 items
CATH INFINITI 5 FR IM (CATHETERS) ×3 IMPLANT
CATH SITESEER 5F MULTI A 2 (CATHETERS) ×3 IMPLANT
KIT HEART LEFT (KITS) ×3 IMPLANT
PACK CARDIAC CATHETERIZATION (CUSTOM PROCEDURE TRAY) ×3 IMPLANT
SHEATH PINNACLE 5F 10CM (SHEATH) ×3 IMPLANT
TRANSDUCER W/STOPCOCK (MISCELLANEOUS) ×3 IMPLANT
TUBING CIL FLEX 10 FLL-RA (TUBING) ×3 IMPLANT
WIRE EMERALD 3MM-J .035X150CM (WIRE) ×3 IMPLANT

## 2015-07-07 NOTE — Progress Notes (Signed)
Site area: rt groin Site Prior to Removal:  Level 0 Pressure Applied For: 20 minutes   Manual:   yes Patient Status During Pull:  stable Post Pull Site:  Level  0 Post Pull Instructions Given:  yes Post Pull Pulses Present: yes Dressing Applied:  tegaderm Bedrest begins @ [id number] Comments:  IV saline locked

## 2015-07-07 NOTE — Consult Note (Signed)
[address]       [place] [address]             [phone]        [facility] B Rish Economy Medical Record [id number] Date of Birth: 03-08-1945  Referring: Dr. Tamala Julian  Primary Care: Marden Noble, MD  Chief Complaint:    Chief Complaint  Patient presents with  . Chest Pain  Patient examined, coronary angiograms and most recent echocardiogram personally reviewed and counseled with patient  History of Present Illness:   I was asked  To evaluate this 70 year old Caucasian male reformed smoker on chronic hemodialysis with ischemic cardiomyopathy, chronic thrombocytopenia on Brillinta for possible redo CABG.  The patient has severe unstable angina with resting angina frequent over the past 2 weeks. The patient had multivessel CABG at [facility] in 2001. Patient had PCI performed last summer. The vein grafts were occluded and the mammary graft is patent to the LAD with collateralization to the distal right. Current coronary angiogram demonstrates patent left IMA, chronically occluded RCA which is atretic and not a graftable target, and circumflex less marginal vessels which are potentially graftable. The patient has ischemic cardiomyopathy With EF of 35%. At the time of CABG the patient had bilateral leg vein harvest. He has changes of venous stasis in his lower extremities and is difficult to tell if there is any saphenous vein remaining. Vein mapping with ultrasound is pending. The patient's left arm has AV fistula for his dialysis access. Examination of the right hand indicates a strong radial pulse but a very weak ulnar pulse-pre-CABG Dopplers pending.  Current Activity/ Functional Status: Patient has hemodialysis Monday Wednesday Friday He is retired and disabled He still stays fairly active   Zubrod Score: At the time of surgery this patient's most appropriate activity status/level should be described as: []     0    Normal activity, no symptoms []     1     Restricted in physical strenuous activity but ambulatory, able to do out light work [x]     2    Ambulatory and capable of self care, unable to do work activities, up and about                 more than 50%  Of the time                            []     3    Only limited self care, in bed greater than 50% of waking hours []     4    Completely disabled, no self care, confined to bed or chair []     5    Moribund  Past Medical History  Diagnosis Date  . Hypertensive heart disease   . Hypercholesterolemia   . Prostate cancer ([place])   . Hepatosplenomegaly 2015    fibrosis, no cirrhosis on 06/2013 liver biopsy  . Thrombocytopenia ([place]) 2015  . Depression with anxiety   . IDDM (insulin dependent diabetes mellitus) ([place])     INSULIN DEPENDENT  . GERD (gastroesophageal reflux disease)   . History of shingles   . Ischemic cardiomyopathy     a. echo 08/06/2014: EF 25-30%, multiple WMA, mod DD, PASP 49 mm Hg;  b. 12/2014 Echo: EF 30-35%, diff HK, mild MR.; Echo 03/2015 - ? LV thrombus (not seen in 3/27 - but EF ? 40-45%.   . CKD (chronic  kidney disease), stage IV ([place])     a. previously on dialysis 11/2013->04/2014;  b. 12/2014 Dialysis resumed.  . Coronary artery disease, occlusive     a. 2001 MI s/p 4v CABG (LIMA-LAD, SVG-D2, SVG-OM1, SVG-right PDA, SVG-right PL1); b. cath 07/30/2014 vein grafts all down, patent LIMA to LAD, significant LM and ost LCx dx; c. 08/07/2014 PCI Synergy DES 2.5 mm x 18 mm to dist LM and ost LCx, rec lifelong DAPT.; 02/2015 - Patent LIMA-LAD with L-R Collaterals, 60% ISR of LM-Cx stent (non-occlusive)  . Chronic combined systolic and diastolic CHF, NYHA class 2 ([place])     a. 08/2013 Echo: EF 40-45%, impaired relaxation;  b. 07/2014 Echo: EF 25-30%;  c. 12/2014 Echo: EF 30-35%, diff HK, mild MR.  . Gallstone pancreatitis 2015  . ESRD (end stage renal disease) [facility])     Past Surgical History  Procedure Laterality Date  . Cardiac surgery    . Shoulder arthroscopy    . Penile  prosthesis implant    . Coronary angioplasty    . Coronary artery bypass graft  2001    LIMA-LAD, SVG-D2, SVG-OM 1, SVG-r PDA, SVG-r PL 1) --> as of January 2017, all grafts besides LIMA occluded.  . Colonoscopy    . Liver biopsy    . Av fistula placement Left 2015  . Cardiac catheterization N/A 07/30/2014    Procedure: Left Heart Cath and Cors/Grafts Angiography;  Surgeon: Dionisio David, MD;  Location: [place] [facility];  Service: Cardiovascular;  Laterality: N/A;  . Cardiac catheterization N/A 08/07/2014    Procedure: Coronary Stent Intervention;  Surgeon: Lorretta Harp, MD;  Location: [place] [facility];  Service: Cardiovascular;  Left Main-Ostial Circumflex PCI with Synergy DES 2.5 mm x 18 mm  . Cardiac catheterization N/A 03/09/2015    Procedure: Left Heart Cath and Coronary Angiography;  Surgeon: Sherren Mocha, MD;  Location: [place] [facility];  Service: Cardiovascular: 100% oLAD & pRCA, ~60% ISR in oCx. Patent LIMA-LAD with L-R collaterals to rPDA  . Transthoracic echocardiogram  07/2014-04/2015    a. echo 08/06/2014: EF 25-30%, multiple WMA, mod DD, PASP 49 mm Hg;  b. 12/2014 Echo: EF 30-35%, diff HK, mild MR.; Echo 03/2015 - ? LV thrombus (not seen in 3/27 - but EF ? 40-45%.   . Cardiac catheterization N/A 06/30/2015    Procedure: Left Heart Cath and Cors/Grafts Angiography;  Surgeon: Belva Crome, MD;  Location: [place] [facility];  Service: Cardiovascular;  Laterality: N/A;    History  Smoking status  . Former Smoker  . Quit date: 02/07/2008  Smokeless tobacco  . Former User    History  Alcohol Use No    Social History   Social History  . Marital Status: Legally Separated    Spouse Name: N/A  . Number of Children: N/A  . Years of Education: N/A   Occupational History  . Not on file.   Social History Main Topics  . Smoking status: Former Smoker    Quit date: 02/07/2008  . Smokeless tobacco: Former Systems developer  . Alcohol Use: No  . Drug Use: No  . Sexual Activity:  Not on file   Other Topics Concern  . Not on file   Social History Narrative    Allergies  Allergen Reactions  . Sulfa Antibiotics Hives and Itching  . Morphine And Related     Makes patient feel weird    Current Facility-Administered Medications  Medication Dose Route Frequency Provider Last  Rate Last Dose  . 0.9 %  sodium chloride infusion  250 mL Intravenous PRN Belva Crome, MD      . 0.9 %  sodium chloride infusion  250 mL Intravenous PRN Belva Crome, MD      . acetaminophen (TYLENOL) tablet 650 mg  650 mg Oral Q4H PRN Brett Canales, PA-C      . acetaminophen (TYLENOL) tablet 650 mg  650 mg Oral Q4H PRN Belva Crome, MD      . aspirin EC tablet 81 mg  81 mg Oral Daily Brett Canales, PA-C   81 mg at 07/02/2015 0847  . calcium acetate (PHOSLO) capsule 1,334 mg  1,334 mg Oral TID WC Leonie Man, MD   1,334 mg at 06/24/2015 1638  . carvedilol (COREG) tablet 6.25 mg  6.25 mg Oral BID WC Brett Canales, PA-C   6.25 mg at 06/27/2015 1638  . heparin ADULT infusion 100 units/mL (25000 units/234mL sodium chloride 0.45%)  1,750 Units/hr Intravenous Continuous Kimberly B Hammons, RPH      . insulin aspart (novoLOG) injection 0-15 Units  0-15 Units Subcutaneous TID WC Brett Canales, PA-C   3 Units at 06/13/2015 1723  . insulin glargine (LANTUS) injection 20 Units  20 Units Subcutaneous QHS Brett Canales, PA-C   20 Units at 07/06/15 2156  . isosorbide mononitrate (IMDUR) 24 hr tablet 90 mg  90 mg Oral Daily Rogelia Mire, NP   90 mg at 06/16/2015 0847  . nitroGLYCERIN (NITROSTAT) SL tablet 0.4 mg  0.4 mg Sublingual Q5 Min x 3 PRN Brett Canales, PA-C   0.4 mg at 07/06/15 0017  . ondansetron (ZOFRAN) injection 4 mg  4 mg Intravenous Q6H PRN Brett Canales, PA-C      . ondansetron [facility]) injection 4 mg  4 mg Intravenous Q6H PRN Belva Crome, MD      . pregabalin (LYRICA) capsule 75 mg  75 mg Oral BID Brett Canales, PA-C   75 mg at 07/01/2015 0846  . ranolazine (RANEXA) 12 hr tablet 500 mg  500  mg Oral BID Brett Canales, PA-C   500 mg at 06/21/2015 0847  . rosuvastatin (CRESTOR) tablet 20 mg  20 mg Oral QHS Brett Canales, PA-C   20 mg at 07/06/15 2155  . sodium chloride flush (NS) 0.9 % injection 3 mL  3 mL Intravenous Q12H Belva Crome, MD   0 mL at 07/06/2015 1749  . sodium chloride flush (NS) 0.9 % injection 3 mL  3 mL Intravenous PRN Belva Crome, MD      . sodium chloride flush (NS) 0.9 % injection 3 mL  3 mL Intravenous Q12H Belva Crome, MD   0 mL at 07/03/2015 1748  . sodium chloride flush (NS) 0.9 % injection 3 mL  3 mL Intravenous PRN Belva Crome, MD      . ticagrelor [facility]) tablet 90 mg  90 mg Oral BID Brett Canales, PA-C   90 mg at 07/03/2015 [id number]    Prescriptions prior to admission  Medication Sig Dispense Refill Last Dose  . aspirin EC 81 MG tablet Take 81 mg by mouth daily.   06/27/2015 at 1530  . calcium acetate (PHOSLO) 667 MG tablet Take 1,334 mg by mouth 3 (three) times daily with meals.    06/12/2015 at Unknown time  . carvedilol (COREG) 6.25 MG tablet Take 1 tablet (6.25 mg total) by mouth 2 (  two) times daily with a meal.   06/15/2015 at 0700  . furosemide (LASIX) 80 MG tablet Take 80 mg by mouth as needed for fluid.    06/19/2015 at Unknown time  . isosorbide mononitrate (IMDUR) 30 MG 24 hr tablet Take 30 mg by mouth daily.   07/04/2015 at Unknown time  . isosorbide mononitrate (IMDUR) 60 MG 24 hr tablet Take 60 mg by mouth daily.    06/15/2015 at Unknown time  . LANTUS SOLOSTAR 100 UNIT/ML Solostar Pen Inject 20 Units into the skin daily at 10 pm.   0 07/04/2015 at Unknown time  . lidocaine-prilocaine (EMLA) cream Apply 1 application topically Every Tuesday,Thursday,and Saturday with dialysis.   1 07/04/2015 at Unknown time  . naproxen sodium (ANAPROX) 220 MG tablet Take 220 mg by mouth 2 (two) times daily with a meal.   07/04/2015 at Unknown time  . nitroGLYCERIN (NITROLINGUAL) 0.4 MG/SPRAY spray Place 1 spray under the tongue every 5 (five) minutes x 3 doses as needed  for chest pain. Do not use together with sublingual nitro 12 g 5 06/22/2015 at Unknown time  . NOVOLOG MIX 70/30 FLEXPEN (70-30) 100 UNIT/ML FlexPen Inject 25 Units into the skin daily with breakfast.    06/10/2015 at Unknown time  . pregabalin (LYRICA) 75 MG capsule Take 1 capsule (75 mg total) by mouth daily. (Patient taking differently: Take 75 mg by mouth 2 (two) times daily. )   07/06/2015 at Unknown time  . ranolazine (RANEXA) 500 MG 12 hr tablet Take 1 tablet (500 mg total) by mouth 2 (two) times daily. 180 tablet 1 06/13/2015 at Unknown time  . rosuvastatin (CRESTOR) 20 MG tablet Take 1 tablet (20 mg total) by mouth at bedtime. 90 tablet 1 07/04/2015 at Unknown time  . ticagrelor (BRILINTA) 90 MG TABS tablet Take 1 tablet (90 mg total) by mouth 2 (two) times daily. 60 tablet 6 06/25/2015 at 0700    Family History  Problem Relation Age of Onset  . Acute myelogenous leukemia Brother   . CAD Brother   . Diabetes Mellitus II Brother   . Heart disease Father 61    CABG     Review of Systems:       Cardiac Review of Systems: Y or N  Chest Pain [  Yes  ]  Resting SOB [ yes  ] Exertional SOB  [place]  ]  Orthopnea [ no ]   Pedal Edema [ no  ]    Palpitations [no  ] Syncope  [ no ]   Presyncope [  no ]  General Review of Systems: [Y] = yes [  ]=no Constitional: recent weight change [  ]; anorexia [  ]; fatigue [  ]; nausea [place]er  ]; night sweats [  ]; fever [  ]; or chills [  ]                                                               Dental: poor dentition[  ]; Last Dentist visit: One year  Eye : blurred vision [  ]; diplopia [   ]; vision changes [  ];  Amaurosis fugax[  ]; Resp: cough [  ];  wheezing[  ];  hemoptysis[  ]; shortness of breath[ yes  ];  paroxysmal nocturnal dyspnea[  ]; dyspnea on exertion[  ]; or orthopnea[  ];  GI:  gallstones[  ], vomiting[  ];  dysphagia[  ]; melena[  ];  hematochezia [  ]; heartburn[  ];   Hx of  Colonoscopy[  ]; GU: kidney stones [  ]; hematuria[  ];    dysuria [  ];  nocturia[  ];  history of     obstruction [  ]; urinary frequency [  ] history of prostate cancer             Skin: rash, swelling[  ];, hair loss[  ];  peripheral edema[  ];  or itching[  ]; Musculosketetal: myalgias[  ];  joint swelling[  ];  joint erythema[  ];  joint pain[ yes];  back pain[  ];  Heme/Lymph: bruising[  ];  bleeding[  ];  anemia[  ];  Neuro: TIA[  ];  headaches[  ];  stroke[  ];  vertigo[  ];  seizures[  ];   paresthesias[  ];  difficulty walking[  ];  Psych:depression[  ]; anxiety[  ];  Endocrine: diabetes[  ];  thyroid dysfunction[  ];  Immunizations: Flu [  ]; Pneumococcal[  ];  Other: Right-hand dominant                          Patient states he did well after his original CABG in 2001 without bleeding or anesthetic complications  Physical Exam: BP 138/77 mmHg  Pulse 53  Temp(Src) 98 F (36.7 C) (Oral)  Resp 15  Ht 5\' 11"  (1.803 m)  Wt 208 lb 4.8 oz (94.484 kg)  BMI 29.06 kg/m2  SpO2 98%        Physical Exam  General: Well-developed 70 year old male no acute distress HEENT: Normocephalic pupils equal , dentition adequate Neck: Supple without JVD, adenopathy, or bruit Chest: Clear to auscultation, symmetrical breath sounds, no rhonchi, no tenderness             or deformity    well-healed sternal incision Cardiovascular: Regular rate and rhythm, no murmur, no gallop, peripheral pulses             palpable in all extremities Abdomen:  Soft, nontender, no palpable mass or organomegaly Extremities: Warm, well-perfused, no clubbing cyanosis edema or tenderness,               2-3+ venous stasis changes of the legs Rectal/GU: Deferred Neuro: Grossly non--focal and symmetrical throughout Skin: Clean and dry without rash or ulceration Diagnostic Studies & Laboratory data:     Recent Radiology Findings:   No results found.   I have independently reviewed the above radiologic studies.  Recent Lab Findings: Lab Results  Component Value Date    WBC 5.2 06/24/2015   HGB 11.1* 06/12/2015   HCT 34.0* 07/01/2015   PLT 81* 06/07/2015   GLUCOSE 130* 07/06/2015   CHOL 88 07/06/2015   TRIG 184* 07/06/2015   HDL 26* 07/06/2015   LDLCALC 25 07/06/2015   ALT 14* 12/11/2014   AST 20 12/11/2014   NA 139 07/06/2015   K 4.3 07/06/2015   CL 106 07/06/2015   CREATININE 7.85* 07/06/2015   BUN 70* 07/06/2015   CO2 21* 07/06/2015   TSH 2.026 11/04/2014   INR 1.22 06/23/2015   HGBA1C 6.9* 07/01/2015      Assessment / Plan:     Patient with severe unstable angina after prior CABG Percutaneous intervention has  been performed within the past 18 months. He continues to have progression of disease. The distal right is not a graftable target. The circumflex is potentially graftable but I am not sure the patient has adequate conduit since both leg veins were harvested at the original operation. With ischemic cardiomyopathy and moderate to severe LV dysfunction, chronic thrombocytopenia, and questionable conduit he would be at extremely high risk for redo CABG. We will review the results of his pre-CABG Dopplers and vein mapping studies.         06/14/2015 8:58 PM

## 2015-07-07 NOTE — Plan of Care (Signed)
Problem: Consults Goal: Cardiac Cath Patient Education (See Patient Education module for education specifics.) Outcome: Progressing Patient returned from evening dialysis treatment with stable vitals signs: Filed Vitals:    07/06/15 1900 07/06/15 1912 07/06/15 1915 07/06/15 2102  BP: 141/79 141/75 142/73 143/84  Pulse: 64 62 62 71  Temp:   97.6 F (36.4 C)   97.9 F (36.6 C)  TempSrc:       Oral  Resp:   19   18  Height:          Weight:   95.8 kg (211 lb 3.2 oz)      SpO2:       98%    On arrival to unit, patient was principally concerned with receipt of missed medications and meal delivery. After these were provided, discussed plan of care, current medications, morning prep and preliminary lab work for cardiac catheterization in AM with patient and spouse at bedside. Also discussed potential complications and initial post-op recovery with patient and family.  Shortly thereafter, heparin level resulted as subtherapeutic and pharmacy made contact to enquire about the infusion. Since there were no problems noted with the infusion, or the IV site, orders were received for an IV heparin bolus of 1000 units with return to maintenance rate of 1500 units/hr.  Approximately 30 minutes after heparin bolus had finished, patient and spouse called for assistance. They had concern for hemodialysis puncture site that was leaking small blood from underneath the dressing in place. This was taken-down and the area cleansed. A small, steady, non-pulsatile, capillary ooze was noted. Manual pressure was held x 5 minutes and a gauze dressing was then applied and fixated with tape.  Approximately one hour later, the new dressing was saturated with blood. This dressing was removed and pressure was held by Jamal Collin, RN who also consulted pharmacy for bleeding issues on IV heparin. A new dressing was then applied to the area. Pharmacy did not recommend any change in heparin dosing, however, it was  recommended that if bleeding persists, that the MD should be contacted to obtain an order for a specialty hemostasis dressing to be placed.  As of the time of this note, there is no further evidence of continued bleeding.  Informed patient about scheduled procedure time window today (13:00-14:00 with Dr. Daneen Schick). He has no further questions or concerns at this juncture.  Will need post-op instruction following cardiac catheterization per protocol. Pre-op and intra-op instruction is complete.  Will continue to monitor closely.

## 2015-07-07 NOTE — Progress Notes (Signed)
Patient Name: Joseph [name] Date of Encounter: 06/23/2015  Hospital Problem List     Principal Problem:   NSTEMI (non-ST elevated myocardial infarction) [facility]) Active Problems:   Type 2 diabetes mellitus with renal manifestations ([place])   Ischemic cardiomyopathy   CAD s/p CABG 2001, prior POBA, PCI/DES July 2016   Chronic combined systolic and diastolic heart failure ([place])   Hypertensive heart disease with heart failure ([facility])   ESRD (end stage renal disease) ([place])   Dyslipidemia, goal LDL below 70    Subjective   No chest pain or dyspnea.  Inpatient Medications    . aspirin EC  81 mg Oral Daily  . calcium acetate  1,334 mg Oral TID WC  . carvedilol  6.25 mg Oral BID WC  . insulin aspart  0-15 Units Subcutaneous TID WC  . insulin glargine  20 Units Subcutaneous QHS  . isosorbide mononitrate  90 mg Oral Daily  . pregabalin  75 mg Oral BID  . ranolazine  500 mg Oral BID  . rosuvastatin  20 mg Oral QHS  . sodium chloride flush  3 mL Intravenous Q12H  . ticagrelor  90 mg Oral BID    Vital Signs    Filed Vitals:   07/06/15 1912 07/06/15 1915 07/06/15 2102 06/22/2015 0429  BP: 141/75 142/73 143/84 145/63  Pulse: 62 62 71 61  Temp: 97.6 F (36.4 C)  97.9 F (36.6 C) 98.9 F (37.2 C)  TempSrc:   Oral Oral  Resp: 19  18 18   Height:      Weight: 211 lb 3.2 oz (95.8 kg)   208 lb 4.8 oz (94.484 kg)  SpO2:   98% 99%    Intake/Output Summary (Last 24 hours) at 06/17/2015 0802 Last data filed at 06/29/2015 0728  Gross per 24 hour  Intake 679.33 ml  Output   2900 ml  Net -2220.67 ml   Filed Weights   07/06/15 1504 07/06/15 1912 07/02/2015 0429  Weight: 215 lb 6.2 oz (97.7 kg) 211 lb 3.2 oz (95.8 kg) 208 lb 4.8 oz (94.484 kg)    Physical Exam    General: Pleasant, NAD. Neuro: Grossly intact HEENT:  Normal  Neck: Supple  Lungs: CTA. Heart: RRR  Abdomen: Soft, non-tender, non-distended Extremities: No edema. AV fistula LUE  Labs    CBC  Recent Labs   07/06/2015 1814 06/11/2015 0453  WBC 5.6 5.2  HGB 10.4* 11.1*  HCT 32.2* 34.0*  MCV 93.6 93.7  PLT 86* 81*   Basic Metabolic Panel  Recent Labs  06/25/2015 1814 07/06/15 0317  NA 140 139  K 5.3* 4.3  CL 105 106  CO2 23 21*  GLUCOSE 202* 130*  BUN 70* 70*  CREATININE 8.00* 7.85*  CALCIUM 7.9* 8.0*   Cardiac Enzymes  Recent Labs  06/13/2015 2110 07/06/15 0317 07/06/15 0841  TROPONINI 0.13* 0.18* 0.26*   Fasting Lipid Panel  Recent Labs  07/06/15 0317  CHOL 88  HDL 26*  LDLCALC 25  TRIG 184*  CHOLHDL 3.4    Telemetry    NSR   Radiology    Dg Chest Portable 1 View  06/13/2015  CLINICAL DATA:  Chest pain EXAM: PORTABLE CHEST 1 VIEW COMPARISON:  12/30/2014 FINDINGS: Cardiac enlargement with prior CABG. Negative for heart failure. Lungs are clear without infiltrate or effusion. No mass lesion. IMPRESSION: No active disease. Electronically Signed   By: Franchot Gallo M.D.   On: 07/03/2015 17:58    Assessment & Plan  1.  NSTEMI/CAD:  S/p prior CABG with subsequent stenting of the native LM/LCX in 07/2014 and cath in 02/2015 revealing mod ISR with otw diffuse occlusive dzs(patent LIMA  LAD, otw 4 occluded grafts, occluded LAD/RCA).  Pt presented with recurrent nitrate responsive chest pain.  His troponins are mildly abnl.  He had more nitrate responsive c/p while in house.  Proceed with cath today. The patient understands that risks include but are not limited to stroke (1 in 1000), death (1 in 56), bleeding (1 in 200), allergic reaction [possibly serious] (1 in 200), and agrees to proceed.  Cont asa, brilinta, heparin,  blocker, nitrate, statin, ranexa.    2.  ESRD:  TTS dialysis @ [facility] Guy Sandifer, MD).  Dialyzed yesterday  3.  Hypertensive Heart Disease:  BP trending high.  Add amlodipine 2.5 mg daily and follow  4.  HL:  LDL 25.  Cont crestor.  5.  Type II DM: cont insulin.  Signed, Kirk Ruths

## 2015-07-07 NOTE — H&P (View-Only) (Signed)
Patient Name: Joseph [name] Date of Encounter: 06/23/2015  Hospital Problem List     Principal Problem:   NSTEMI (non-ST elevated myocardial infarction) [facility]) Active Problems:   Type 2 diabetes mellitus with renal manifestations ([employer])   Ischemic cardiomyopathy   CAD s/p CABG 2001, prior POBA, PCI/DES July 2016   Chronic combined systolic and diastolic heart failure ([place])   Hypertensive heart disease with heart failure (HCC)   ESRD (end stage renal disease) ([place])   Dyslipidemia, goal LDL below 70    Subjective   No chest pain or dyspnea.  Inpatient Medications    . aspirin EC  81 mg Oral Daily  . calcium acetate  1,334 mg Oral TID WC  . carvedilol  6.25 mg Oral BID WC  . insulin aspart  0-15 Units Subcutaneous TID WC  . insulin glargine  20 Units Subcutaneous QHS  . isosorbide mononitrate  90 mg Oral Daily  . pregabalin  75 mg Oral BID  . ranolazine  500 mg Oral BID  . rosuvastatin  20 mg Oral QHS  . sodium chloride flush  3 mL Intravenous Q12H  . ticagrelor  90 mg Oral BID    Vital Signs    Filed Vitals:   07/06/15 1912 07/06/15 1915 07/06/15 2102 06/28/2015 0429  BP: 141/75 142/73 143/84 145/63  Pulse: 62 62 71 61  Temp: 97.6 F (36.4 C)  97.9 F (36.6 C) 98.9 F (37.2 C)  TempSrc:   Oral Oral  Resp: 19  18 18   Height:      Weight: 211 lb 3.2 oz (95.8 kg)   208 lb 4.8 oz (94.484 kg)  SpO2:   98% 99%    Intake/Output Summary (Last 24 hours) at 06/18/2015 0802 Last data filed at 06/16/2015 0728  Gross per 24 hour  Intake 679.33 ml  Output   2900 ml  Net -2220.67 ml   Filed Weights   07/06/15 1504 07/06/15 1912 07/06/2015 0429  Weight: 215 lb 6.2 oz (97.7 kg) 211 lb 3.2 oz (95.8 kg) 208 lb 4.8 oz (94.484 kg)    Physical Exam    General: Pleasant, NAD. Neuro: Grossly intact HEENT:  Normal  Neck: Supple  Lungs: CTA. Heart: RRR  Abdomen: Soft, non-tender, non-distended Extremities: No edema. AV fistula LUE  Labs    CBC  Recent Labs   07/02/2015 1814 06/08/2015 0453  WBC 5.6 5.2  HGB 10.4* 11.1*  HCT 32.2* 34.0*  MCV 93.6 93.7  PLT 86* 81*   Basic Metabolic Panel  Recent Labs  07/01/2015 1814 07/06/15 0317  NA 140 139  K 5.3* 4.3  CL 105 106  CO2 23 21*  GLUCOSE 202* 130*  BUN 70* 70*  CREATININE 8.00* 7.85*  CALCIUM 7.9* 8.0*   Cardiac Enzymes  Recent Labs  06/08/2015 2110 07/06/15 0317 07/06/15 0841  TROPONINI 0.13* 0.18* 0.26*   Fasting Lipid Panel  Recent Labs  07/06/15 0317  CHOL 88  HDL 26*  LDLCALC 25  TRIG 184*  CHOLHDL 3.4    Telemetry    NSR   Radiology    Dg Chest Portable 1 View  06/12/2015  CLINICAL DATA:  Chest pain EXAM: PORTABLE CHEST 1 VIEW COMPARISON:  12/30/2014 FINDINGS: Cardiac enlargement with prior CABG. Negative for heart failure. Lungs are clear without infiltrate or effusion. No mass lesion. IMPRESSION: No active disease. Electronically Signed   By: Franchot Gallo M.D.   On: 06/28/2015 17:58    Assessment & Plan  1.  NSTEMI/CAD:  S/p prior CABG with subsequent stenting of the native LM/LCX in 07/2014 and cath in 02/2015 revealing mod ISR with otw diffuse occlusive dzs(patent LIMA  LAD, otw 4 occluded grafts, occluded LAD/RCA).  Pt presented with recurrent nitrate responsive chest pain.  His troponins are mildly abnl.  He had more nitrate responsive c/p while in house.  Proceed with cath today. The patient understands that risks include but are not limited to stroke (1 in 1000), death (1 in 80), bleeding (1 in 200), allergic reaction [possibly serious] (1 in 200), and agrees to proceed.  Cont asa, brilinta, heparin,  blocker, nitrate, statin, ranexa.    2.  ESRD:  TTS dialysis @ [facility] Guy Sandifer, MD).  Dialyzed yesterday  3.  Hypertensive Heart Disease:  BP trending high.  Add amlodipine 2.5 mg daily and follow  4.  HL:  LDL 25.  Cont crestor.  5.  Type II DM: cont insulin.  Signed, Kirk Ruths

## 2015-07-07 NOTE — Interval H&P Note (Signed)
Cath Lab Visit (complete for each Cath Lab visit)  Clinical Evaluation Leading to the Procedure:   ACS: Yes.    Non-ACS:    Anginal Classification: CCS III  Anti-ischemic medical therapy: Maximal Therapy (2 or more classes of medications)  Non-Invasive Test Results: No non-invasive testing performed  Prior CABG: Previous CABG      History and Physical Interval Note:  06/25/2015 12:16 PM  Joseph [name]  has presented today for surgery, with the diagnosis of cp  The various methods of treatment have been discussed with the patient and family. After consideration of risks, benefits and other options for treatment, the patient has consented to  Procedure(s): Left Heart Cath and Cors/Grafts Angiography (N/A) as a surgical intervention .  The patient's history has been reviewed, patient examined, no change in status, stable for surgery.  I have reviewed the patient's chart and labs.  Questions were answered to the patient's satisfaction.     Belva Crome III

## 2015-07-07 NOTE — Progress Notes (Signed)
ANTICOAGULATION CONSULT NOTE - Follow Up Consult  Pharmacy Consult for heparin Indication: CAD s/p cath pending TCTS eval  Allergies  Allergen Reactions  . Sulfa Antibiotics Hives and Itching  . Morphine And Related     Makes patient feel weird    Patient Measurements: Height: 5\' 11"  (180.3 cm) Weight: 208 lb 4.8 oz (94.484 kg) IBW/kg (Calculated) : 75.3 Heparin Dosing Weight: 95 kg  Vital Signs: Temp: 98.9 F (37.2 C) (05/31 0429) Temp Source: Oral (05/31 0429) BP: 108/60 mmHg (05/31 1350) Pulse Rate: 53 (05/31 1350)  Labs:  Recent Labs  07/03/2015 1814 06/16/2015 2110 07/06/15 0317 07/06/15 0841 07/06/15 2056 06/19/2015 0451 06/28/2015 0453  HGB 10.4*  --   --   --   --   --  11.1*  HCT 32.2*  --   --   --   --   --  34.0*  PLT 86*  --   --   --   --   --  81*  LABPROT 15.6*  --   --   --   --   --   --   INR 1.22  --   --   --   --   --   --   HEPARINUNFRC  --   --   --   --  0.21* 0.17*  --   CREATININE 8.00*  --  7.85*  --   --   --   --   TROPONINI  --  0.13* 0.18* 0.26*  --   --   --     Estimated Creatinine Clearance: 10.7 mL/min (by C-G formula based on Cr of 7.85).  Assessment: 70 yo Male with multi-vessel CAD pending TCTS eval for redo-CABG.  Pt is s/p cath today with sheath pull at 1345.  To start heparin 8 hrs later (2145 tonight).  Goal of Therapy:  Heparin level 0.3-0.7 units/ml Monitor platelets by anticoagulation protocol: Yes   Plan:  Heparin at 1750 units/hr (start at 2145 tonight) Next heparin level with AM labs 6/1 Daily heparin level and CBC while on heparin Follo-up TCTS recs  [employer], Pharm.D., BCPS Clinical Pharmacist Pager [phone] 07/03/2015 2:20 PM

## 2015-07-07 NOTE — Progress Notes (Signed)
ANTICOAGULATION CONSULT NOTE - Follow Up Consult  Pharmacy Consult for heparin Indication: NSTEMI  Allergies  Allergen Reactions  . Sulfa Antibiotics Hives and Itching  . Morphine And Related     Makes patient feel weird    Patient Measurements: Height: 5\' 11"  (180.3 cm) Weight: 208 lb 4.8 oz (94.484 kg) IBW/kg (Calculated) : 75.3 Heparin Dosing Weight: 95 kg  Vital Signs: Temp: 98.9 F (37.2 C) (05/31 0429) Temp Source: Oral (05/31 0429) BP: 145/63 mmHg (05/31 0429) Pulse Rate: 61 (05/31 0429)  Labs:  Recent Labs  06/14/2015 1814 07/01/2015 2110 07/06/15 0317 07/06/15 0841 07/06/15 2056 06/18/2015 0451 06/10/2015 0453  HGB 10.4*  --   --   --   --   --  11.1*  HCT 32.2*  --   --   --   --   --  34.0*  PLT 86*  --   --   --   --   --  81*  LABPROT 15.6*  --   --   --   --   --   --   INR 1.22  --   --   --   --   --   --   HEPARINUNFRC  --   --   --   --  0.21* 0.17*  --   CREATININE 8.00*  --  7.85*  --   --   --   --   TROPONINI  --  0.13* 0.18* 0.26*  --   --   --     Estimated Creatinine Clearance: 10.7 mL/min (by C-G formula based on Cr of 7.85).  Assessment: 70 yo Male with NSTEMI for heparin.  Pt had bleeding from AVF last night after HD, but has since resolved.    Goal of Therapy:  Heparin level 0.3-0.7 units/ml Monitor platelets by anticoagulation protocol: Yes   Plan:  Increase Heparin 1700 units/hr F/U after cath today  Phillis Knack, PharmD, BCPS   06/17/2015 6:24 AM

## 2015-07-07 NOTE — Progress Notes (Signed)
  Joseph [name] Progress Note   Subjective: no c/o  Filed Vitals:   06/20/2015 1430 07/06/2015 1445 06/21/2015 1500 06/08/2015 1630  BP: 136/67 134/81 117/63   Pulse:      Temp:    98 F (36.7 C)  TempSrc:    Oral  Resp:      Height:      Weight:      SpO2:        Inpatient medications: . aspirin EC  81 mg Oral Daily  . calcium acetate  1,334 mg Oral TID WC  . carvedilol  6.25 mg Oral BID WC  . insulin aspart  0-15 Units Subcutaneous TID WC  . insulin glargine  20 Units Subcutaneous QHS  . isosorbide mononitrate  90 mg Oral Daily  . pregabalin  75 mg Oral BID  . ranolazine  500 mg Oral BID  . rosuvastatin  20 mg Oral QHS  . sodium chloride flush  3 mL Intravenous Q12H  . sodium chloride flush  3 mL Intravenous Q12H  . ticagrelor  90 mg Oral BID   . heparin     sodium chloride, sodium chloride, acetaminophen, acetaminophen, nitroGLYCERIN, ondansetron (ZOFRAN) IV, ondansetron (ZOFRAN) IV, sodium chloride flush, sodium chloride flush  Exam: Alert, no distress No jvd or bruits Chest clear bilat RRR no MRG Abd soft ntnd no mass or ascites +bs Ext no LE or UE edema Alert, Ox 3 , nf LFA AVF +bruit  CXR - no active disease  Dialysis: [facility] TTS Dry wt 215 lbs   Assessment: 1. Chest pain / NSTEMI/ hx CABG/ hx L main+LCX stent - cath today showing sig ISR of L main stent.  See card notes.  2. ESRD TTS hd 3. Volume - 2kg under dry wt today, BP's good 4. CAD/ hx CABG '01/ L main+LCX stent Jul '16 5. DM insulin 6. Chron low plts 7. 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 ICM EF 25-30%  Plan - HD Thursday   [name] pager [phone]    cell [phone] 06/12/2015, 4:37 PM    Recent Labs Lab 06/08/2015 1814 07/06/15 0317  NA 140 139  K 5.3* 4.3  CL 105 106  CO2 23 21*  GLUCOSE 202* 130*  BUN 70* 70*  CREATININE 8.00* 7.85*  CALCIUM 7.9* 8.0*   No results for input(s): AST, ALT, ALKPHOS, BILITOT,  PROT, ALBUMIN in the last 168 hours.  Recent Labs Lab 06/12/2015 1814 06/07/2015 0453  WBC 5.6 5.2  HGB 10.4* 11.1*  HCT 32.2* 34.0*  MCV 93.6 93.7  PLT 86* 81*

## 2015-07-08 ENCOUNTER — Other Ambulatory Visit: Payer: Self-pay | Admitting: *Deleted

## 2015-07-08 ENCOUNTER — Inpatient Hospital Stay (HOSPITAL_COMMUNITY): Payer: Medicare Other

## 2015-07-08 ENCOUNTER — Other Ambulatory Visit (HOSPITAL_COMMUNITY): Payer: Medicare Other

## 2015-07-08 ENCOUNTER — Encounter (HOSPITAL_COMMUNITY): Payer: Medicare Other

## 2015-07-08 DIAGNOSIS — R079 Chest pain, unspecified: Secondary | ICD-10-CM

## 2015-07-08 DIAGNOSIS — I2510 Atherosclerotic heart disease of native coronary artery without angina pectoris: Secondary | ICD-10-CM

## 2015-07-08 LAB — GLUCOSE, CAPILLARY
Glucose-Capillary: 128 mg/dL — ABNORMAL HIGH (ref 65–99)
Glucose-Capillary: 154 mg/dL — ABNORMAL HIGH (ref 65–99)
Glucose-Capillary: 184 mg/dL — ABNORMAL HIGH (ref 65–99)

## 2015-07-08 LAB — CBC
HCT: 33.2 % — ABNORMAL LOW (ref 39.0–52.0)
Hemoglobin: 10.5 g/dL — ABNORMAL LOW (ref 13.0–17.0)
MCH: 29.4 pg (ref 26.0–34.0)
MCHC: 31.6 g/dL (ref 30.0–36.0)
MCV: 93 fL (ref 78.0–100.0)
PLATELETS: 75 10*3/uL — AB (ref 150–400)
RBC: 3.57 MIL/uL — AB (ref 4.22–5.81)
RDW: 15.1 % (ref 11.5–15.5)
WBC: 5.5 10*3/uL (ref 4.0–10.5)

## 2015-07-08 LAB — HEPARIN LEVEL (UNFRACTIONATED): HEPARIN UNFRACTIONATED: 0.44 [IU]/mL (ref 0.30–0.70)

## 2015-07-08 LAB — PLATELET INHIBITION P2Y12: Platelet Function  P2Y12: 104 [PRU] — ABNORMAL LOW (ref 194–418)

## 2015-07-08 NOTE — Progress Notes (Signed)
ANTICOAGULATION CONSULT NOTE - Follow Up Consult  Pharmacy Consult for heparin Indication: CAD s/p cath pending TCTS eval  Allergies  Allergen Reactions  . Sulfa Antibiotics Hives and Itching  . Morphine And Related     Makes patient feel weird    Patient Measurements: Height: 5\' 11"  (180.3 cm) Weight: 207 lb 14.3 oz (94.3 kg) IBW/kg (Calculated) : 75.3 Heparin Dosing Weight: 95 kg  Vital Signs: Temp: 97.5 F (36.4 C) (06/01 1230) Temp Source: Oral (06/01 1230) BP: 159/69 mmHg (06/01 1320) Pulse Rate: 70 (06/01 1320)  Labs:  Recent Labs  06/11/2015 1814 06/07/2015 2110 07/06/15 0317 07/06/15 0841 07/06/15 2056 06/08/2015 0451 06/23/2015 0453 07/08/15 0400  HGB 10.4*  --   --   --   --   --  11.1* 10.5*  HCT 32.2*  --   --   --   --   --  34.0* 33.2*  PLT 86*  --   --   --   --   --  81* 75*  LABPROT 15.6*  --   --   --   --   --   --   --   INR 1.22  --   --   --   --   --   --   --   HEPARINUNFRC  --   --   --   --  0.21* 0.17*  --  0.44  CREATININE 8.00*  --  7.85*  --   --   --   --   --   TROPONINI  --  0.13* 0.18* 0.26*  --   --   --   --     Estimated Creatinine Clearance: 10.7 mL/min (by C-G formula based on Cr of 7.85).  Assessment: 70 yo Male with multi-vessel CAD pending TCTS eval for redo-CABG.  Pt continues on therapeutic heparin s/p cath today pending decision re: redo-CABG vs PCI.    Goal of Therapy:  Heparin level 0.3-0.7 units/ml Monitor platelets by anticoagulation protocol: Yes   Plan:  Continue Heparin at 1750 units/hr  Daily heparin level and CBC while on heparin Follo-up TCTS recs  [employer], Pharm.D., BCPS Clinical Pharmacist Pager [phone] 07/08/2015 1:58 PM

## 2015-07-08 NOTE — Progress Notes (Signed)
Patient Profile: 70 year old male with a past medical history of CAD, status post CABG, ischemic cardiomyopathy and end-stage renal disease. Patient had his last PCI in July 2016. He was admitted in January 2017 and underwent cath which revealed moderate in-stent restenosis of his Lcx and elevated LVED, treated medically. He has been readmitted for recurrent CP. LHC 5/31 showed new severe ISR within the distal left main and ostial circumflex. Patient is being considered for re-do CABG vs PCI.    Subjective: No chest pain or dyspnea  Objective: Vital signs in last 24 hours: Temp:  [98 F (36.7 C)-98.1 F (36.7 C)] 98.1 F (36.7 C) (06/01 0432) Pulse Rate:  [0-65] 56 (06/01 0432) Resp:  [0-20] 18 (06/01 0432) BP: (108-176)/(50-91) 131/50 mmHg (06/01 0432) SpO2:  [0 %-100 %] 98 % (06/01 0432) Weight:  [210 lb 4.8 oz (95.391 kg)] 210 lb 4.8 oz (95.391 kg) (06/01 0432) Last BM Date: 06/11/2015  Intake/Output from previous day: 05/31 0701 - 06/01 0700 In: 783 [P.O.:780; I.V.:3] Out: -  Intake/Output this shift:    Medications Current Facility-Administered Medications  Medication Dose Route Frequency Provider Last Rate Last Dose  . 0.9 %  sodium chloride infusion  250 mL Intravenous PRN Belva Crome, MD      . 0.9 %  sodium chloride infusion  250 mL Intravenous PRN Belva Crome, MD      . acetaminophen (TYLENOL) tablet 650 mg  650 mg Oral Q4H PRN Brett Canales, PA-C      . acetaminophen (TYLENOL) tablet 650 mg  650 mg Oral Q4H PRN Belva Crome, MD      . aspirin EC tablet 81 mg  81 mg Oral Daily Brett Canales, PA-C   81 mg at 07/06/2015 0847  . calcium acetate (PHOSLO) capsule 1,334 mg  1,334 mg Oral TID WC Leonie Man, MD   1,334 mg at 06/21/2015 1638  . carvedilol (COREG) tablet 6.25 mg  6.25 mg Oral BID WC Brett Canales, PA-C   6.25 mg at 06/29/2015 1638  . heparin ADULT infusion 100 units/mL (25000 units/246mL sodium chloride 0.45%)  1,750 Units/hr Intravenous Continuous  Theone Murdoch Hammons, RPH 17.5 mL/hr at 06/09/2015 2147 1,750 Units/hr at 06/17/2015 2147  . insulin aspart (novoLOG) injection 0-15 Units  0-15 Units Subcutaneous TID WC Brett Canales, PA-C   3 Units at 06/19/2015 1723  . insulin glargine (LANTUS) injection 20 Units  20 Units Subcutaneous QHS Brett Canales, PA-C   20 Units at 06/18/2015 2159  . isosorbide mononitrate (IMDUR) 24 hr tablet 90 mg  90 mg Oral Daily Rogelia Mire, NP   90 mg at 06/27/2015 0847  . nitroGLYCERIN (NITROSTAT) SL tablet 0.4 mg  0.4 mg Sublingual Q5 Min x 3 PRN Brett Canales, PA-C   0.4 mg at 07/06/15 0017  . ondansetron (ZOFRAN) injection 4 mg  4 mg Intravenous Q6H PRN Brett Canales, PA-C      . ondansetron [facility]) injection 4 mg  4 mg Intravenous Q6H PRN Belva Crome, MD      . pregabalin (LYRICA) capsule 75 mg  75 mg Oral BID Brett Canales, PA-C   75 mg at 07/05/2015 2148  . ranolazine (RANEXA) 12 hr tablet 500 mg  500 mg Oral BID Brett Canales, PA-C   500 mg at 06/25/2015 2148  . rosuvastatin (CRESTOR) tablet 20 mg  20 mg Oral QHS Brett Canales, PA-C   20 mg  at 06/21/2015 2147  . sodium chloride flush (NS) 0.9 % injection 3 mL  3 mL Intravenous Q12H Belva Crome, MD   0 mL at 07/06/2015 1749  . sodium chloride flush (NS) 0.9 % injection 3 mL  3 mL Intravenous PRN Belva Crome, MD      . sodium chloride flush (NS) 0.9 % injection 3 mL  3 mL Intravenous Q12H Belva Crome, MD   3 mL at 07/06/2015 2150  . sodium chloride flush (NS) 0.9 % injection 3 mL  3 mL Intravenous PRN Belva Crome, MD      . ticagrelor [facility]) tablet 90 mg  90 mg Oral BID Brett Canales, PA-C   90 mg at 06/10/2015 2147    PE: General appearance: alert, cooperative and no distress Neck: no carotid bruit and no JVD Lungs: clear to auscultation bilaterally Heart: regular rate and rhythm, S1, S2 normal, no murmur, click, rub or gallop Extremities: no LEE Pulses: 2+ and symmetric Skin: warm and dry Neurologic: Grossly normal  Lab Results:   Recent Labs   06/09/2015 1814 06/23/2015 0453 07/08/15 0400  WBC 5.6 5.2 5.5  HGB 10.4* 11.1* 10.5*  HCT 32.2* 34.0* 33.2*  PLT 86* 81* 75*   BMET  Recent Labs  06/08/2015 1814 07/06/15 0317  NA 140 139  K 5.3* 4.3  CL 105 106  CO2 23 21*  GLUCOSE 202* 130*  BUN 70* 70*  CREATININE 8.00* 7.85*  CALCIUM 7.9* 8.0*   PT/INR  Recent Labs  06/12/2015 1814  LABPROT 15.6*  INR 1.22   Cholesterol  Recent Labs  07/06/15 0317  CHOL 88   Cardiac Enzymes Invalid input(s): TROPONIN,  CKMB  Studies/Results: Procedures    Left Heart Cath and Cors/Grafts Angiography    Conclusion     Prox RCA lesion, 100% stenosed.  Ost LAD lesion, 100% stenosed.  LIMA .  Widely patent LIMA. The graft inserts in the mid-LAD and supplies the proximal LAD and diagonals in retrograde fashion. The right PDA is also collateralized from septals of the LAD  Ost Cx lesion, 60% stenosed. The lesion was previously treated with a stent (unknown type).  Ost LM to LM lesion, 50% stenosed.   Severe in-stent restenosis within the distal left main and ostial circumflex. This stent was previously placed in July 2016. 2 moderate size obtuse marginal branches arise distal to the stented segment.  Total occlusion of the native right coronary and ostial LAD.  Total occlusion of all saphenous vein grafts.  Patent LIMA to LAD. The native LAD supplies collaterals to the PDA and circumflex territory.    Assessment/Plan  Principal Problem:   NSTEMI (non-ST elevated myocardial infarction) [facility]) Active Problems:   Type 2 diabetes mellitus with renal manifestations ([place])   Ischemic cardiomyopathy   CAD s/p CABG 2001, prior POBA, PCI/DES July 2016   Chronic combined systolic and diastolic heart failure ([place])   Hypertensive heart disease with heart failure (HCC)   ESRD (end stage renal disease) ([place])   Dyslipidemia, goal LDL below 70   Pain in the chest   1. CAD: LHC 5/31 showed severe in-stent restenosis within  the distal left main and ostial circumflex, Total occlusion of the native right coronary and ostial LAD, Total occlusion of all saphenous vein grafts and patent LIMA to LAD. The native LAD supplies collaterals to the PDA and circumflex territory. Treatment options are now somewhat limited since there is restenosis in the left main stent. The  stent is 2.5 mm in diameter. It will be difficult to expand beyond 3.25 mm in diameter. Restenting will be associated with a 30-50% in-stent restenosis rate within the next 6-12 months. He is being considered for re-do CABG. Dr. Prescott Gum is following. If not a candidate for surgery, will need PCI. Continue medical therapy for now.  He is on ASA and Effient. Will need Effient washout if he will undergo red-do CABG.   2. ESRD: on HD.  Nephrology following.  3. T2DM: Continue Insulin.   4. DLD: Continue Crestor.  5. HTN: BP is controlled on current regimen.   6. Ischemic Cardiomyopathy: On BB therapy with Coreg. No ACE/ARB given ESRD. On Imdur for afterload reduction. Volume control through HD.     LOS: 2 days    Brittainy M. Rosita Fire, PA-C 07/08/2015 7:56 AM As above, patient seen and examined.He denies chest pain or dyspnea. Right femoral cath site without hematoma. Plan to continue present medications for his coronary disease. Awaiting final decision from Dr. Prescott Gum concerning redo coronary artery bypass and graft. If he is not a candidate I will ask our interventionalists to review for consideration of PCI. He will be high risk for either procedure. We will need to discontinue brilinta if surgery planned. Kirk Ruths

## 2015-07-08 NOTE — Progress Notes (Signed)
CARDIAC REHAB PHASE I   PRE:  Rate/Rhythm: 64 SR    BP: sitting 110/64    SaO2:   MODE:  Ambulation: 1100 ft   POST:  Rate/Rhythm: 70 SR    BP: sitting 147/76     SaO2: 99 RA  Tolerated very well. No c/o, denied CP.  [place], ACSM 07/08/2015 3:15 PM

## 2015-07-08 NOTE — Progress Notes (Signed)
Spoke with Sharyn Lull in Vascular Lab and notified MD was awaiting dopplers and vein mapping to make CABG decision.  Sharyn Lull stated they would try to make sure dopplers are completed today.  Sanda Linger

## 2015-07-08 NOTE — Progress Notes (Addendum)
Pre-op Cardiac Surgery  Carotid Findings:  The right internal carotid artery exhibits elevated velocities suggestive of 60-79% internal carotid artery stenosis. The left internal carotid artery exhibits 1-39% stenosis. Vertebral arteries are patent with antegrade flow.  Upper Extremity Right Left  Brachial Pressures 113-Triphasic AVF  Radial Waveforms Triphasic AVF  Ulnar Waveforms Triphasic AVF  Palmar Arch (Allen's Test) Signal decreases >50% with both radial and ulnar compression. Unable to adequately assess due to AVF.     Lower  Extremity Right Left  Dorsalis Pedis Triphasic Triphasic  Posterior Tibial Triphasic Triphasic     Right Lower Extremity Vein Map    Right Great Saphenous Vein   Segment Diameter Comment  1. Origin 7.50mm   2. High Thigh 4.60mm   3. Mid Thigh  Not visualized.  4. Low Thigh 3.24mm   5. At Knee 4.30mm   6. High Calf 4.69mm   7. Low Calf 2.69mm   8. Ankle 2.55mm                 Right Small Saphenous Vein  Segment Diameter Comment  1. Origin 3.69mm   2. High Calf 3.38mm   3. Low Calf 2.64mm   4. Ankle 2.58mm                  Left Lower Extremity Vein Map    Left Great Saphenous Vein   Segment Diameter Comment  1. Origin 10.57mm   2. High Thigh 8.18mm   3. Mid Thigh  Not visualized.  4. Low Thigh  Not visualized.  5. At Knee  Not visualized.  6. High Calf 2.70mm   7. Low Calf 2.29mm   8. Ankle 2.15mm                 Left Small Saphenous Vein Unable to visualize.    07/08/2015 5:03 PM Maudry Mayhew, RVT, RDCS, RDMS

## 2015-07-08 NOTE — Progress Notes (Signed)
CT Surgery  Will stop ticagrelor while undergoing eval for redo CABG Pre CABG dopplers and veon mapping of legs are pending  PVT

## 2015-07-08 NOTE — Progress Notes (Signed)
  [facility] Progress Note   Subjective: no c/o  Filed Vitals:   07/08/15 0824 07/08/15 0830 07/08/15 0900 07/08/15 0930  BP: 168/71 156/61 143/63 124/63  Pulse: 58 56 56 58  Temp:      TempSrc:      Resp: 18 17 20 18   Height:      Weight:      SpO2:        Inpatient medications: . aspirin EC  81 mg Oral Daily  . calcium acetate  1,334 mg Oral TID WC  . carvedilol  6.25 mg Oral BID WC  . insulin aspart  0-15 Units Subcutaneous TID WC  . insulin glargine  20 Units Subcutaneous QHS  . isosorbide mononitrate  90 mg Oral Daily  . pregabalin  75 mg Oral BID  . ranolazine  500 mg Oral BID  . rosuvastatin  20 mg Oral QHS  . sodium chloride flush  3 mL Intravenous Q12H  . sodium chloride flush  3 mL Intravenous Q12H  . ticagrelor  90 mg Oral BID   . heparin 1,750 Units/hr (06/30/2015 2147)   sodium chloride, sodium chloride, acetaminophen, acetaminophen, nitroGLYCERIN, ondansetron (ZOFRAN) IV, ondansetron (ZOFRAN) IV, sodium chloride flush, sodium chloride flush  Exam: Alert, no distress No jvd or bruits Chest clear bilat RRR no MRG Abd soft ntnd no mass or ascites +bs Ext no LE or UE edema Alert, Ox 3 , nf LFA AVF +bruit  CXR - no active disease  Dialysis: [facility] TTS Dry wt 215 lbs   Assessment: 1. Chest pain / NSTEMI/ hx CABG/ hx L main+LCX stent - cath showed sig ISR of L main stent.  See card notes.  2. ESRD TTS hd 3. Volume - 2kg under dry wt today, BP's good, LVEDP 15 at cath 4. CAD/ hx CABG '01/ L main+LCX stent Jul '16 5. DM insulin 6. Chron low plts 7. 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 ICM EF 25-30%  Plan - HD today, UF 2kg as Ronnie Derby MD [employer] pager [phone]    cell [phone] 07/08/2015, 10:13 AM    Recent Labs Lab 06/24/2015 1814 07/06/15 0317  NA 140 139  K 5.3* 4.3  CL 105 106  CO2 23 21*  GLUCOSE 202* 130*  BUN 70* 70*  CREATININE 8.00* 7.85*  CALCIUM 7.9* 8.0*   No  results for input(s): AST, ALT, ALKPHOS, BILITOT, PROT, ALBUMIN in the last 168 hours.  Recent Labs Lab 07/04/2015 1814 06/08/2015 0453 07/08/15 0400  WBC 5.6 5.2 5.5  HGB 10.4* 11.1* 10.5*  HCT 32.2* 34.0* 33.2*  MCV 93.6 93.7 93.0  PLT 86* 81* 75*

## 2015-07-08 DEATH — deceased

## 2015-07-09 DIAGNOSIS — I25110 Atherosclerotic heart disease of native coronary artery with unstable angina pectoris: Secondary | ICD-10-CM

## 2015-07-09 LAB — GLUCOSE, CAPILLARY
GLUCOSE-CAPILLARY: 140 mg/dL — AB (ref 65–99)
GLUCOSE-CAPILLARY: 95 mg/dL (ref 65–99)
GLUCOSE-CAPILLARY: 267 mg/dL — AB (ref 65–99)
Glucose-Capillary: 110 mg/dL — ABNORMAL HIGH (ref 65–99)

## 2015-07-09 LAB — CBC
HEMATOCRIT: 31.7 % — AB (ref 39.0–52.0)
HEMOGLOBIN: 10.2 g/dL — AB (ref 13.0–17.0)
MCH: 29.6 pg (ref 26.0–34.0)
MCHC: 32.2 g/dL (ref 30.0–36.0)
MCV: 91.9 fL (ref 78.0–100.0)
Platelets: 71 10*3/uL — ABNORMAL LOW (ref 150–400)
RBC: 3.45 MIL/uL — AB (ref 4.22–5.81)
RDW: 15.1 % (ref 11.5–15.5)
WBC: 4.8 10*3/uL (ref 4.0–10.5)

## 2015-07-09 LAB — PLATELET INHIBITION P2Y12: Platelet Function  P2Y12: 103 [PRU] — ABNORMAL LOW (ref 194–418)

## 2015-07-09 LAB — HEPARIN LEVEL (UNFRACTIONATED): Heparin Unfractionated: 0.66 IU/mL (ref 0.30–0.70)

## 2015-07-09 NOTE — Progress Notes (Signed)
CARDIAC REHAB PHASE I   PRE:  Rate/Rhythm: 60 SR    BP: sitting 132/56    SaO2:   MODE:  Ambulation: 1100 ft   POST:  Rate/Rhythm: 76 SR    BP: sitting 171/63     SaO2:   Tolerated well, no c/o. Discussed surgery (if he has it) as pt had questions/concerns. Will need [facility] booklet if he definitely has surgery. Girlfriend present and supportive. [place], ACSM 07/09/2015 2:26 PM

## 2015-07-09 NOTE — Care Management Important Message (Signed)
Important Message  Patient Details  Name: Joseph [name] MRN: [id number] Date of Birth: 07-29-1945   Medicare Important Message Given:  Yes    [name] 07/09/2015, 9:40 AM

## 2015-07-09 NOTE — Progress Notes (Signed)
Patient Profile: 70 year old male with a past medical history of CAD, status post CABG, ischemic cardiomyopathy and end-stage renal disease. Patient had his last PCI in July 2016. He was admitted in January 2017 and underwent cath which revealed moderate in-stent restenosis of his Lcx and elevated LVED, treated medically. He has been readmitted for recurrent CP. LHC 5/31 showed new severe ISR within the distal left main and ostial circumflex. Patient is being considered for re-do CABG vs PCI.    Subjective: Feels well. No complaints. No chest pain or dyspnea  Objective: Vital signs in last 24 hours: Temp:  [97.5 F (36.4 C)-98.1 F (36.7 C)] 97.8 F (36.6 C) (06/02 0500) Pulse Rate:  [54-70] 56 (06/02 0800) Resp:  [18-23] 18 (06/02 0500) BP: (86-159)/(50-69) 111/50 mmHg (06/02 0800) SpO2:  [98 %-99 %] 98 % (06/02 0500) Weight:  [207 lb 14.3 oz (94.3 kg)-208 lb (94.348 kg)] 208 lb (94.348 kg) (06/02 0500) Last BM Date: 07/08/15  Intake/Output from previous day: 06/01 0701 - 06/02 0700 In: 2523 [P.O.:1020; I.V.:1503] Out: -  Intake/Output this shift:    Medications Current Facility-Administered Medications  Medication Dose Route Frequency Provider Last Rate Last Dose  . 0.9 %  sodium chloride infusion  250 mL Intravenous PRN Belva Crome, MD      . 0.9 %  sodium chloride infusion  250 mL Intravenous PRN Belva Crome, MD      . acetaminophen (TYLENOL) tablet 650 mg  650 mg Oral Q4H PRN Brett Canales, PA-C      . acetaminophen (TYLENOL) tablet 650 mg  650 mg Oral Q4H PRN Belva Crome, MD      . aspirin EC tablet 81 mg  81 mg Oral Daily Brett Canales, PA-C   81 mg at 07/09/15 0759  . calcium acetate (PHOSLO) capsule 1,334 mg  1,334 mg Oral TID WC Leonie Man, MD   1,334 mg at 07/08/15 1719  . carvedilol (COREG) tablet 6.25 mg  6.25 mg Oral BID WC Einar Pheasant Hager, PA-C   6.25 mg at 07/09/15 0800  . heparin ADULT infusion 100 units/mL (25000 units/216mL sodium chloride 0.45%)   1,750 Units/hr Intravenous Continuous Theone Murdoch Hammons, RPH 17.5 mL/hr at 07/09/15 0207 1,750 Units/hr at 07/09/15 0207  . insulin aspart (novoLOG) injection 0-15 Units  0-15 Units Subcutaneous TID WC Brett Canales, PA-C   2 Units at 07/09/15 0809  . insulin glargine (LANTUS) injection 20 Units  20 Units Subcutaneous QHS Brett Canales, PA-C   20 Units at 07/08/15 2225  . isosorbide mononitrate (IMDUR) 24 hr tablet 90 mg  90 mg Oral Daily Rogelia Mire, NP   90 mg at 07/09/15 0802  . nitroGLYCERIN (NITROSTAT) SL tablet 0.4 mg  0.4 mg Sublingual Q5 Min x 3 PRN Brett Canales, PA-C   0.4 mg at 07/06/15 0017  . ondansetron (ZOFRAN) injection 4 mg  4 mg Intravenous Q6H PRN Brett Canales, PA-C      . ondansetron [facility]) injection 4 mg  4 mg Intravenous Q6H PRN Belva Crome, MD      . pregabalin (LYRICA) capsule 75 mg  75 mg Oral BID Brett Canales, PA-C   75 mg at 07/09/15 0758  . ranolazine (RANEXA) 12 hr tablet 500 mg  500 mg Oral BID Brett Canales, PA-C   500 mg at 07/09/15 0759  . rosuvastatin (CRESTOR) tablet 20 mg  20 mg Oral QHS Brett Canales,  PA-C   20 mg at 07/08/15 2225  . sodium chloride flush (NS) 0.9 % injection 3 mL  3 mL Intravenous Q12H Belva Crome, MD   3 mL at 07/08/15 2234  . sodium chloride flush (NS) 0.9 % injection 3 mL  3 mL Intravenous PRN Belva Crome, MD      . sodium chloride flush (NS) 0.9 % injection 3 mL  3 mL Intravenous Q12H Belva Crome, MD   3 mL at 07/08/15 1331  . sodium chloride flush (NS) 0.9 % injection 3 mL  3 mL Intravenous PRN Belva Crome, MD        PE: General appearance: alert, cooperative and no distress Neck: no carotid bruit and no JVD Lungs: clear to auscultation bilaterally Heart: regular rate and rhythm, S1, S2 normal, no murmur, click, rub or gallop Extremities: no LEE Pulses: 2+ and symmetric Skin: warm and dry Neurologic: Grossly normal  Lab Results:   Recent Labs  06/30/2015 0453 07/08/15 0400 07/09/15 0358  WBC 5.2 5.5 4.8    HGB 11.1* 10.5* 10.2*  HCT 34.0* 33.2* 31.7*  PLT 81* 75* 71*   BMET No results for input(s): NA, K, CL, CO2, GLUCOSE, BUN, CREATININE, CALCIUM in the last 72 hours. PT/INR No results for input(s): LABPROT, INR in the last 72 hours. Cholesterol No results for input(s): CHOL in the last 72 hours. Cardiac Enzymes Invalid input(s): TROPONIN,  CKMB  Studies/Results: Procedures    Left Heart Cath and Cors/Grafts Angiography    Conclusion     Prox RCA lesion, 100% stenosed.  Ost LAD lesion, 100% stenosed.  LIMA .  Widely patent LIMA. The graft inserts in the mid-LAD and supplies the proximal LAD and diagonals in retrograde fashion. The right PDA is also collateralized from septals of the LAD  Ost Cx lesion, 60% stenosed. The lesion was previously treated with a stent (unknown type).  Ost LM to LM lesion, 50% stenosed.   Severe in-stent restenosis within the distal left main and ostial circumflex. This stent was previously placed in July 2016. 2 moderate size obtuse marginal branches arise distal to the stented segment.  Total occlusion of the native right coronary and ostial LAD.  Total occlusion of all saphenous vein grafts.  Patent LIMA to LAD. The native LAD supplies collaterals to the PDA and circumflex territory.    Assessment/Plan  Principal Problem:   NSTEMI (non-ST elevated myocardial infarction) [facility]) Active Problems:   Type 2 diabetes mellitus with renal manifestations ([place])   Ischemic cardiomyopathy   CAD s/p CABG 2001, prior POBA, PCI/DES July 2016   Chronic combined systolic and diastolic heart failure ([place])   Hypertensive heart disease with heart failure (HCC)   ESRD (end stage renal disease) ([place])   Dyslipidemia, goal LDL below 70   Pain in the chest   1. CAD: LHC 5/31 showed severe in-stent restenosis within the distal left main and ostial circumflex, Total occlusion of the native right coronary and ostial LAD, Total occlusion of all saphenous  vein grafts and patent LIMA to LAD. The native LAD supplies collaterals to the PDA and circumflex territory. Treatment options are now somewhat limited since there is restenosis in the left main stent. The stent is 2.5 mm in diameter. It will be difficult to expand beyond 3.25 mm in diameter. Restenting will be associated with a 30-50% in-stent restenosis rate within the next 6-12 months. He is being considered for re-do CABG. Dr. Prescott Gum is following. We are  still awaiting final decision.  If not a candidate for surgery, will need PCI. He is currently CP free. No dyspnea. Continue medical therapy for now. Brilinta is now on hold for washout, given potential need for surgery.    2. ESRD: on HD.  Nephrology following.  3. T2DM: Continue Insulin.   4. DLD: Continue Crestor.  5. HTN: BP is controlled on current regimen.   6. Ischemic Cardiomyopathy: On BB therapy with Coreg. No ACE/ARB given ESRD. On Imdur for afterload reduction. Volume control through HD.     LOS: 3 days    Brittainy M. Rosita Fire, PA-C 07/09/2015 8:34 AM  As above, patient seen and examined. Patient denies chest pain or dyspnea. We are awaiting final decision concerning candidacy for redo coronary artery bypass graft. It is felt not to be a candidate we'll review with interventions for consideration of high risk PCI. Continue present medications. Kirk Ruths

## 2015-07-09 NOTE — Progress Notes (Signed)
2 Days Post-Op Procedure(s) (LRB): Left Heart Cath and Cors/Grafts Angiography (N/A) Subjective: Conduit for redo CABG a problem R radial artery not usable with positive Allen test L arm w/ A-V fistula for HD No saph vein on L by U/S May have R lower leg saph vein but not clear on U/S- venogram by IR ordered  Objective: Vital signs in last 24 hours: Temp:  [97.5 F (36.4 C)-98.1 F (36.7 C)] 97.8 F (36.6 C) (06/02 0500) Pulse Rate:  [54-70] 56 (06/02 0800) Cardiac Rhythm:  [-] Sinus bradycardia;Heart block (06/02 0700) Resp:  [18-23] 18 (06/02 0500) BP: (111-159)/(50-69) 111/50 mmHg (06/02 0800) SpO2:  [98 %-99 %] 98 % (06/02 0500) Weight:  [207 lb 14.3 oz (94.3 kg)-208 lb (94.348 kg)] 208 lb (94.348 kg) (06/02 0500)  Hemodynamic parameters for last 24 hours:  stable  Intake/Output from previous day: 06/01 0701 - 06/02 0700 In: 2523 [P.O.:1020; I.V.:1503] Out: -  Intake/Output this shift: Total I/O In: 360 [P.O.:360] Out: -     Lab Results:  Recent Labs  07/08/15 0400 07/09/15 0358  WBC 5.5 4.8  HGB 10.5* 10.2*  HCT 33.2* 31.7*  PLT 75* 71*   BMET: No results for input(s): NA, K, CL, CO2, GLUCOSE, BUN, CREATININE, CALCIUM in the last 72 hours.  PT/INR: No results for input(s): LABPROT, INR in the last 72 hours. ABG    Component Value Date/Time   TCO2 22 03/09/2015 2330   CBG (last 3)   Recent Labs  07/08/15 1722 07/08/15 2139 07/09/15 0756  GLUCAP 154* 184* 140*    Assessment/Plan: S/P Procedure(s) (LRB): Left Heart Cath and Cors/Grafts Angiography (N/A) review venogram of R leg greater saphenous vein  Hold brillinta   LOS: 3 days    Tharon Aquas Trigt III 07/09/2015

## 2015-07-09 NOTE — Progress Notes (Addendum)
  [facility] Progress Note   Subjective: no c/o  Filed Vitals:   07/08/15 1320 07/08/15 2012 07/09/15 0500 07/09/15 0800  BP: 159/69 130/62 120/61 111/50  Pulse: 70 57 54 56  Temp:  98.1 F (36.7 C) 97.8 F (36.6 C)   TempSrc:  Oral Oral   Resp:  18 18   Height:      Weight:   94.348 kg (208 lb)   SpO2:  99% 98%     Inpatient medications: . aspirin EC  81 mg Oral Daily  . calcium acetate  1,334 mg Oral TID WC  . carvedilol  6.25 mg Oral BID WC  . insulin aspart  0-15 Units Subcutaneous TID WC  . insulin glargine  20 Units Subcutaneous QHS  . isosorbide mononitrate  90 mg Oral Daily  . pregabalin  75 mg Oral BID  . ranolazine  500 mg Oral BID  . rosuvastatin  20 mg Oral QHS  . sodium chloride flush  3 mL Intravenous Q12H  . sodium chloride flush  3 mL Intravenous Q12H   . heparin 1,750 Units/hr (07/09/15 0207)   sodium chloride, sodium chloride, acetaminophen, acetaminophen, nitroGLYCERIN, ondansetron (ZOFRAN) IV, ondansetron (ZOFRAN) IV, sodium chloride flush, sodium chloride flush  Exam: Alert, no distress No jvd or bruits Chest clear bilat RRR no MRG Abd soft ntnd no mass or ascites +bs Ext no LE or UE edema Alert, Ox 3 , nf LFA AVF +bruit  CXR - no active disease  Dialysis: [facility]  4h  400/600  2/2.5 bath  Hep 2000 then 500/hr   94kg Epo 5000/ hd Ven 50/wk      Assessment: 1. Chest pain / NSTEMI/ hx CABG/ hx L main+LCX stent - cath showed sig ISR of L main stent.  W/U per TCTS in progress.  2. ESRD TTS hd 3. Volume - stable, at dry wt, LVEDP 15 at cath 4. CAD/ hx CABG '01/ L main+LCX stent Jul '16 5. DM insulin 6. Chron low plts 7. 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 ICM EF 25-30%  Plan - HD Sat, min UF   Kelly Splinter MD [place] pager [phone]    cell [phone] 07/09/2015, 10:49 AM    Recent Labs Lab 06/24/2015 1814 07/06/15 0317  NA 140 139  K 5.3* 4.3  CL 105 106  CO2 23 21*   GLUCOSE 202* 130*  BUN 70* 70*  CREATININE 8.00* 7.85*  CALCIUM 7.9* 8.0*   No results for input(s): AST, ALT, ALKPHOS, BILITOT, PROT, ALBUMIN in the last 168 hours.  Recent Labs Lab 06/07/2015 0453 07/08/15 0400 07/09/15 0358  WBC 5.2 5.5 4.8  HGB 11.1* 10.5* 10.2*  HCT 34.0* 33.2* 31.7*  MCV 93.7 93.0 91.9  PLT 81* 75* 71*

## 2015-07-09 NOTE — Progress Notes (Signed)
ANTICOAGULATION CONSULT NOTE - Follow Up Consult  Pharmacy Consult for heparin Indication: CAD s/p cath plan CABG  Allergies  Allergen Reactions  . Sulfa Antibiotics Hives and Itching  . Morphine And Related     Makes patient feel weird    Patient Measurements: Height: 5\' 11"  (180.3 cm) Weight: 208 lb (94.348 kg) IBW/kg (Calculated) : 75.3 Heparin Dosing Weight: 95 kg  Vital Signs: Temp: 97.8 F (36.6 C) (06/02 0500) Temp Source: Oral (06/02 0500) BP: 111/50 mmHg (06/02 0800) Pulse Rate: 56 (06/02 0800)  Labs:  Recent Labs  06/16/2015 0451  07/06/2015 0453 07/08/15 0400 07/09/15 0358  HGB  --   < > 11.1* 10.5* 10.2*  HCT  --   --  34.0* 33.2* 31.7*  PLT  --   --  81* 75* 71*  HEPARINUNFRC 0.17*  --   --  0.44 0.66  < > = values in this interval not displayed.  Estimated Creatinine Clearance: 10.7 mL/min (by C-G formula based on Cr of 7.85).  Assessment: 70 yo Male with multi-vessel CAD pending TCTS eval for redo-CABG.  Pt continues on therapeutic heparin s/p cath 6/1.  PRU 104 now ticagrelor on hold - last stent 7/16.   Heparin drip 1750 units/hr HL 0.66 up from 0.44 on same rate.  Will dec slightly with accumulation. H/H stable  - low pltc but chronic and stable  Goal of Therapy:  Heparin level 0.3-0.7 units/ml Monitor platelets by anticoagulation protocol: Yes   Plan:  Decrease Heparin at 1700 units/hr  Daily heparin level and CBC while on heparin   Bonnita Nasuti Pharm.D. CPP, BCPS Clinical Pharmacist [phone] 07/09/2015 10:43 AM

## 2015-07-10 ENCOUNTER — Encounter (HOSPITAL_COMMUNITY): Payer: Self-pay | Admitting: General Surgery

## 2015-07-10 DIAGNOSIS — Z794 Long term (current) use of insulin: Secondary | ICD-10-CM

## 2015-07-10 DIAGNOSIS — E1121 Type 2 diabetes mellitus with diabetic nephropathy: Secondary | ICD-10-CM

## 2015-07-10 DIAGNOSIS — I5042 Chronic combined systolic (congestive) and diastolic (congestive) heart failure: Secondary | ICD-10-CM

## 2015-07-10 DIAGNOSIS — Z9861 Coronary angioplasty status: Secondary | ICD-10-CM

## 2015-07-10 LAB — CBC
HEMATOCRIT: 33 % — AB (ref 39.0–52.0)
HEMOGLOBIN: 10.6 g/dL — AB (ref 13.0–17.0)
MCH: 29.5 pg (ref 26.0–34.0)
MCHC: 32.1 g/dL (ref 30.0–36.0)
MCV: 91.9 fL (ref 78.0–100.0)
Platelets: 67 10*3/uL — ABNORMAL LOW (ref 150–400)
RBC: 3.59 MIL/uL — ABNORMAL LOW (ref 4.22–5.81)
RDW: 15 % (ref 11.5–15.5)
WBC: 5.1 10*3/uL (ref 4.0–10.5)

## 2015-07-10 LAB — RENAL FUNCTION PANEL
ALBUMIN: 3.7 g/dL (ref 3.5–5.0)
ANION GAP: 11 (ref 5–15)
ANION GAP: 10 (ref 5–15)
Albumin: 3.6 g/dL (ref 3.5–5.0)
BUN: 48 mg/dL — ABNORMAL HIGH (ref 6–20)
BUN: 18 mg/dL (ref 6–20)
CALCIUM: 8.2 mg/dL — AB (ref 8.9–10.3)
CALCIUM: 8.1 mg/dL — AB (ref 8.9–10.3)
CO2: 23 mmol/L (ref 22–32)
CO2: 29 mmol/L (ref 22–32)
CREATININE: 3.32 mg/dL — AB (ref 0.61–1.24)
Chloride: 101 mmol/L (ref 101–111)
Chloride: 96 mmol/L — ABNORMAL LOW (ref 101–111)
Creatinine, Ser: 6.19 mg/dL — ABNORMAL HIGH (ref 0.61–1.24)
GFR calc Af Amer: 21 mL/min — ABNORMAL LOW (ref >60)
GFR calc non Af Amer: 18 mL/min — ABNORMAL LOW (ref >60)
GFR, EST AFRICAN AMERICAN: 10 mL/min — AB (ref >60)
GFR, EST NON AFRICAN AMERICAN: 8 mL/min — AB (ref >60)
GLUCOSE: 222 mg/dL — AB (ref 65–99)
Glucose, Bld: 111 mg/dL — ABNORMAL HIGH (ref 65–99)
PHOSPHORUS: 2.8 mg/dL (ref 2.5–4.6)
Phosphorus: 6.1 mg/dL — ABNORMAL HIGH (ref 2.5–4.6)
Potassium: 4.5 mmol/L (ref 3.5–5.1)
Potassium: 3.5 mmol/L (ref 3.5–5.1)
SODIUM: 135 mmol/L (ref 135–145)
SODIUM: 135 mmol/L (ref 135–145)

## 2015-07-10 LAB — GLUCOSE, CAPILLARY
GLUCOSE-CAPILLARY: 204 mg/dL — AB (ref 65–99)
Glucose-Capillary: 163 mg/dL — ABNORMAL HIGH (ref 65–99)
Glucose-Capillary: 184 mg/dL — ABNORMAL HIGH (ref 65–99)

## 2015-07-10 LAB — PLATELET INHIBITION P2Y12: Platelet Function  P2Y12: 166 [PRU] — ABNORMAL LOW (ref 194–418)

## 2015-07-10 LAB — HEPARIN LEVEL (UNFRACTIONATED): Heparin Unfractionated: 0.57 IU/mL (ref 0.30–0.70)

## 2015-07-10 MED ORDER — PENTAFLUOROPROP-TETRAFLUOROETH EX AERO: 1.0000 "application " | INHALATION_SPRAY | CUTANEOUS | Status: DC | PRN

## 2015-07-10 MED ORDER — SODIUM CHLORIDE 0.9 % IV SOLN: 100.0000 mL | INTRAVENOUS | Status: DC | PRN

## 2015-07-10 MED ORDER — HEPARIN SODIUM (PORCINE) 1000 UNIT/ML DIALYSIS
3500.0000 [IU] | INTRAMUSCULAR | Status: DC | PRN
  Filled 2015-07-10: qty 4

## 2015-07-10 MED ORDER — LIDOCAINE HCL (PF) 1 % IJ SOLN: 5.0000 mL | INTRAMUSCULAR | Status: DC | PRN

## 2015-07-10 MED ORDER — LIDOCAINE-PRILOCAINE 2.5-2.5 % EX CREA: 1.0000 "application " | TOPICAL_CREAM | CUTANEOUS | Status: DC | PRN

## 2015-07-10 MED ORDER — LIDOCAINE-PRILOCAINE 2.5-2.5 % EX CREA
1.0000 "application " | TOPICAL_CREAM | CUTANEOUS | Status: DC | PRN
  Filled 2015-07-10: qty 5

## 2015-07-10 MED ORDER — ALTEPLASE 2 MG IJ SOLR: 2.0000 mg | Freq: Once | INTRAMUSCULAR | Status: DC | PRN

## 2015-07-10 MED ORDER — HEPARIN SODIUM (PORCINE) 1000 UNIT/ML DIALYSIS: 1000.0000 [IU] | INTRAMUSCULAR | Status: DC | PRN

## 2015-07-10 MED ORDER — HEPARIN SODIUM (PORCINE) 1000 UNIT/ML DIALYSIS
1000.0000 [IU] | INTRAMUSCULAR | Status: DC | PRN
  Filled 2015-07-10: qty 1

## 2015-07-10 NOTE — Progress Notes (Signed)
Patient Name: Joseph [name] Date of Encounter: 07/10/2015  Principal Problem:   NSTEMI (non-ST elevated myocardial infarction) [facility]) Active Problems:   Type 2 diabetes mellitus with renal manifestations ([place])   Ischemic cardiomyopathy   CAD s/p CABG 2001, prior POBA, PCI/DES July 2016   Chronic combined systolic and diastolic heart failure ([place])   Hypertensive heart disease with heart failure (HCC)   ESRD (end stage renal disease) ([place])   Dyslipidemia, goal LDL below 70   Pain in the chest   Length of Stay: 4  SUBJECTIVE 70 year old male with a past medical history of CAD, status post CABG, ischemic cardiomyopathy and end-stage renal disease. Patient had his last PCI in July 2016. He was admitted in January 2017 and underwent cath which revealed moderate in-stent restenosis of his Lcx and elevated LVED, treated medically. He has been readmitted for recurrent CP. LHC 5/31 showed new severe ISR within the distal left main and ostial circumflex. Patient is being considered for re-do CABG vs PCI. Has limited conduits available. Feels well, tolerated HD today well. No angina  CURRENT MEDS . aspirin EC  81 mg Oral Daily  . calcium acetate  1,334 mg Oral TID WC  . carvedilol  6.25 mg Oral BID WC  . insulin aspart  0-15 Units Subcutaneous TID WC  . insulin glargine  20 Units Subcutaneous QHS  . isosorbide mononitrate  90 mg Oral Daily  . pregabalin  75 mg Oral BID  . ranolazine  500 mg Oral BID  . rosuvastatin  20 mg Oral QHS  . sodium chloride flush  3 mL Intravenous Q12H  . sodium chloride flush  3 mL Intravenous Q12H    OBJECTIVE   Intake/Output Summary (Last 24 hours) at 07/10/15 1311 Last data filed at 07/10/15 1040  Gross per 24 hour  Intake 442.87 ml  Output   1200 ml  Net -757.13 ml   Filed Weights   07/10/15 0500 07/10/15 0700 07/10/15 1040  Weight: 95.618 kg (210 lb 12.8 oz) 95.7 kg (210 lb 15.7 oz) 94.5 kg (208 lb 5.4 oz)    PHYSICAL EXAM Filed Vitals:   07/10/15 0925 07/10/15 1000 07/10/15 1030 07/10/15 1040  BP: 140/67 108/63 110/66 116/70  Pulse: 58 60 63 69  Temp:    97.1 F (36.2 C)  TempSrc:    Oral  Resp: 19 19 19 19   Height:      Weight:    94.5 kg (208 lb 5.4 oz)  SpO2:       General: Alert, oriented x3, no distress Head: no evidence of trauma, PERRL, EOMI, no exophtalmos or lid lag, no myxedema, no xanthelasma; normal ears, nose and oropharynx Neck: normal jugular venous pulsations and no hepatojugular reflux; brisk carotid pulses without delay and no carotid bruits Chest: clear to auscultation, no signs of consolidation by percussion or palpation, normal fremitus, symmetrical and full respiratory excursions Cardiovascular: normal position and quality of the apical impulse, regular rhythm, normal first and second heart sounds, no rubs or gallops, 2/6 systolic murmur Abdomen: no tenderness or distention, no masses by palpation, no abnormal pulsatility or arterial bruits, normal bowel sounds, no hepatosplenomegaly Extremities: no clubbing, cyanosis or edema; 2+ radial, ulnar and brachial pulses bilaterally; 2+ right femoral, posterior tibial and dorsalis pedis pulses; 2+ left femoral, posterior tibial and dorsalis pedis pulses; no subclavian or femoral bruits Neurological: grossly nonfocal  LABS  CBC  Recent Labs  07/09/15 0358 07/10/15 0416  WBC 4.8 5.1  HGB 10.2* 10.6*  HCT 31.7* 33.0*  MCV 91.9 91.9  PLT 71* 67*   Basic Metabolic Panel  Recent Labs  07/10/15 0736 07/10/15 1209  NA 135 135  K 4.5 3.5  CL 101 96*  CO2 23 29  GLUCOSE 111* 222*  BUN 48* 18  CREATININE 6.19* 3.32*  CALCIUM 8.2* 8.1*  PHOS 6.1* 2.8   Liver Function Tests  Recent Labs  07/10/15 0736 07/10/15 1209  ALBUMIN 3.6 3.7   Radiology Studies Imaging results have been reviewed and No results found.    Left Heart Cath and Cors/Grafts Angiography    Conclusion     Prox RCA lesion, 100% stenosed.  Ost LAD lesion, 100%  stenosed.  LIMA .  Widely patent LIMA. The graft inserts in the mid-LAD and supplies the proximal LAD and diagonals in retrograde fashion. The right PDA is also collateralized from septals of the LAD  Ost Cx lesion, 60% stenosed. The lesion was previously treated with a stent (unknown type).  Ost LM to LM lesion, 50% stenosed.   Severe in-stent restenosis within the distal left main and ostial circumflex. This stent was previously placed in July 2016. 2 moderate size obtuse marginal branches arise distal to the stented segment.  Total occlusion of the native right coronary and ostial LAD.  Total occlusion of all saphenous vein grafts.  Patent LIMA to LAD. The native LAD supplies collaterals to the PDA and circumflex territory.         TELE NSR    ASSESSMENT AND PLAN  1. CAD:  Treatment options are now somewhat limited since there is restenosis in the left main stent. The stent is 2.5 mm in diameter. It will be difficult to expand beyond 3.25 mm in diameter. Restenting will be associated with a 30-50% in-stent restenosis rate within the next 6-12 months. He is being considered for re-do CABG. Dr. Prescott Gum is following. We are still awaiting final decision. He is to have a saphenous venography on Monday. If not a candidate for surgery, will need PCI. He is currently CP free. No dyspnea. Continue medical therapy for now. Brilinta is now on hold for washout, given potential need for surgery.   2. ESRD: on HD. Nephrology following.  3. T2DM: Continue Insulin.   4. DLD: Continue Crestor.  5. HTN: BP is controlled on current regimen.   6. Ischemic Cardiomyopathy: On BB therapy with Coreg. No ACE/ARB given ESRD. On Imdur for afterload reduction. Volume control through HD.   Sanda Klein, MD, [facility] [phone] office [phone] pager 07/10/2015 1:11 PM

## 2015-07-10 NOTE — Progress Notes (Signed)
CARDIAC REHAB PHASE I   PRE:  Rate/Rhythm: 66 sinus 3  BP:  Supine:    Sitting: 120/51  Standing:    SaO2: 92% ra   MODE:  Ambulation: 1100 ft   POST:  Rate/Rhythem: 75 sinus rhythm  BP:  Supine:   Sitting: 129/57  Standing:    SaO2: 98% ra   1344-1410  Pt ambulated in hallway x 1 assist.  Steady gait, asymptomatic.  Pt returned to bed, call light in reach. Pt given OHS booklet and teaching including sternal precautions and IS use post op. Pt verbalized understanding  [employer]

## 2015-07-10 NOTE — Progress Notes (Signed)
Pt's AV fistula site oozing blood. Pressure dressing applied. RN called and spoke with dialysis and they are going to come and assess the site. Will continue to monitor.

## 2015-07-10 NOTE — Progress Notes (Signed)
Patient ID: Joseph [name], male   DOB: [birth year], 70 y.o.   MRN: [id number]    Referring Physician(s): Tharon Aquas Trigt  Supervising Physician: Corrie Mckusick  Patient Status: inpt  Chief Complaint: Evaluate (R) LE veins  Subjective: We have been asked to see this patient to evaluate his LE saphenous veins for a redo CABG.  He has no complaints right now.  He normally is on Brilinta and has been switched to a heparin drip at this time.    Allergies: Sulfa antibiotics and Morphine and related  Medications: Prior to Admission medications   Medication Sig Start Date End Date Taking? Authorizing Provider  aspirin EC 81 MG tablet Take 81 mg by mouth daily.   Yes Historical Provider, MD  calcium acetate (PHOSLO) 667 MG tablet Take 1,334 mg by mouth 3 (three) times daily with meals.  05/21/15  Yes Historical Provider, MD  carvedilol (COREG) 6.25 MG tablet Take 1 tablet (6.25 mg total) by mouth 2 (two) times daily with a meal. 03/11/15  Yes Brittainy Erie Noe, PA-C  furosemide (LASIX) 80 MG tablet Take 80 mg by mouth as needed for fluid.    Yes Historical Provider, MD  isosorbide mononitrate (IMDUR) 30 MG 24 hr tablet Take 30 mg by mouth daily. 06/24/15  Yes Historical Provider, MD  isosorbide mononitrate (IMDUR) 60 MG 24 hr tablet Take 60 mg by mouth daily.  04/01/15  Yes Historical Provider, MD  LANTUS SOLOSTAR 100 UNIT/ML Solostar Pen Inject 20 Units into the skin daily at 10 pm.  04/18/15  Yes Historical Provider, MD  lidocaine-prilocaine (EMLA) cream Apply 1 application topically Every Tuesday,Thursday,and Saturday with dialysis.  01/27/15  Yes Historical Provider, MD  naproxen sodium (ANAPROX) 220 MG tablet Take 220 mg by mouth 2 (two) times daily with a meal.   Yes Historical Provider, MD  nitroGLYCERIN (NITROLINGUAL) 0.4 MG/SPRAY spray Place 1 spray under the tongue every 5 (five) minutes x 3 doses as needed for chest pain. Do not use together with sublingual nitro 08/08/14  Yes Almyra Deforest, PA   NOVOLOG MIX 70/30 FLEXPEN (70-30) 100 UNIT/ML FlexPen Inject 25 Units into the skin daily with breakfast.  05/06/15  Yes Historical Provider, MD  pregabalin (LYRICA) 75 MG capsule Take 1 capsule (75 mg total) by mouth daily. Patient taking differently: Take 75 mg by mouth 2 (two) times daily.  01/04/15  Yes Nita Sells, MD  ranolazine (RANEXA) 500 MG 12 hr tablet Take 1 tablet (500 mg total) by mouth 2 (two) times daily. 02/25/15  Yes Rogelia Mire, NP  rosuvastatin (CRESTOR) 20 MG tablet Take 1 tablet (20 mg total) by mouth at bedtime. 02/25/15  Yes Rogelia Mire, NP  ticagrelor (BRILINTA) 90 MG TABS tablet Take 1 tablet (90 mg total) by mouth 2 (two) times daily. 12/11/14  Yes Brett Canales, PA-C    Vital Signs: BP 110/66 mmHg  Pulse 63  Temp(Src) 97 F (36.1 C) (Oral)  Resp 19  Ht 5\' 11"  (1.803 m)  Wt 210 lb 15.7 oz (95.7 kg)  BMI 29.44 kg/m2  SpO2 98%  Physical Exam: Gen: NAD Heart: regular rate and rhythm Lungs: CTAB Abd: soft, NT, ND, +BS  Imaging: No results found.  Labs:  CBC:  Recent Labs  06/25/2015 0453 07/08/15 0400 07/09/15 0358 07/10/15 0416  WBC 5.2 5.5 4.8 5.1  HGB 11.1* 10.5* 10.2* 10.6*  HCT 34.0* 33.2* 31.7* 33.0*  PLT 81* 75* 71* 67*    COAGS:  Recent Labs  11/04/14 0536 03/09/15 2323 07/03/2015 1814  INR 1.29 1.15 1.22  APTT  --  31  --     BMP:  Recent Labs  03/11/15 1117 05/25/15 1343 06/20/2015 1814 07/06/15 0317 07/10/15 0736  NA 139  --  140 139 135  K 5.0  --  5.3* 4.3 4.5  CL 102  --  105 106 101  CO2 21*  --  23 21* 23  GLUCOSE 251*  --  202* 130* 111*  BUN 51*  --  70* 70* 48*  CALCIUM 7.9*  --  7.9* 8.0* 8.2*  CREATININE 6.22* 5.29* 8.00* 7.85* 6.19*  GFRNONAA 8* 10* 6* 6* 8*  GFRAA 10* 12* 7* 7* 10*    LIVER FUNCTION TESTS:  Recent Labs  08/06/14 0447  12/11/14 0835  01/01/15 0902 01/04/15 1415 03/11/15 1117 07/10/15 0736  BILITOT 0.7  --  0.9  --   --   --   --   --   AST 23  --  20  --    --   --   --   --   ALT 13*  --  14*  --   --   --   --   --   ALKPHOS 86  --  77  --   --   --   --   --   PROT 6.3*  --  7.9  --   --   --   --   --   ALBUMIN 2.8*  < > 3.4*  < > 2.8* 2.8* 3.0* 3.6  < > = values in this interval not displayed.  Assessment and Plan: 1. CAD, needs redo CABG -we have been asked to evaluate the patient for venograms to look at his greater saphenous veins. -he will be NPO after MN on Sunday -may continue to his heparin -the procedure including risks and complications such as bleeding, infection, and vessel injury have been d/w the patient and he is agreeable to proceed and has signed consent.  Electronically Signed: Henreitta Cea 07/10/2015, 10:45 AM   I spent a total of 25 Minutes at the the patient's bedside AND on the patient's hospital floor or unit, greater than 50% of which was counseling/coordinating care for venous evaluation, CAD, needs redo CABG

## 2015-07-10 NOTE — Procedures (Signed)
On dialysis, no complaints.  Up 1 kg over dry wt.  Will follow.     I was present at this dialysis session, have reviewed the session itself and made  appropriate changes Kelly Splinter MD [place] pager [phone]    cell [phone] 07/10/2015, 9:27 AM

## 2015-07-10 NOTE — Progress Notes (Signed)
ANTICOAGULATION CONSULT NOTE - Follow Up Consult  Pharmacy Consult for heparin Indication: CAD s/p cath plan CABG  Allergies  Allergen Reactions  . Sulfa Antibiotics Hives and Itching  . Morphine And Related     Makes patient feel weird    Patient Measurements: Height: 5\' 11"  (180.3 cm) Weight: 210 lb 15.7 oz (95.7 kg) IBW/kg (Calculated) : 75.3 Heparin Dosing Weight: 95 kg  Vital Signs: Temp: 97 F (36.1 C) (06/03 0700) Temp Source: Oral (06/03 0700) BP: 141/69 mmHg (06/03 0705) Pulse Rate: 53 (06/03 0705)  Labs:  Recent Labs  07/08/15 0400 07/09/15 0358 07/10/15 0416  HGB 10.5* 10.2* 10.6*  HCT 33.2* 31.7* 33.0*  PLT 75* 71* 67*  HEPARINUNFRC 0.44 0.66 0.57    Estimated Creatinine Clearance: 10.8 mL/min (by C-G formula based on Cr of 7.85).  Assessment: 70 yo Male with multi-vessel CAD pending TCTS eval for redo-CABG.  Pt continues on therapeutic heparin s/p cath 6/1. PRU 166 now ticagrelor on hold - last stent 7/16.   Heparin level remains therapeutic at 0.57. H/H stable  - low pltc but chronic and stable.  Goal of Therapy:  Heparin level 0.3-0.7 units/ml Monitor platelets by anticoagulation protocol: Yes   Plan:  Continue heparin at 1700 units/hr  Daily HL and CBC Monitor s/sx bleeding   Stephens November, PharmD Clinical Pharmacy Resident 7:30 AM, 07/10/2015

## 2015-07-11 LAB — CBC
HEMATOCRIT: 30.4 % — AB (ref 39.0–52.0)
HEMOGLOBIN: 9.8 g/dL — AB (ref 13.0–17.0)
MCH: 29.8 pg (ref 26.0–34.0)
MCHC: 32.2 g/dL (ref 30.0–36.0)
MCV: 92.4 fL (ref 78.0–100.0)
Platelets: 64 10*3/uL — ABNORMAL LOW (ref 150–400)
RBC: 3.29 MIL/uL — AB (ref 4.22–5.81)
RDW: 15.2 % (ref 11.5–15.5)
WBC: 5.1 10*3/uL (ref 4.0–10.5)

## 2015-07-11 LAB — GLUCOSE, CAPILLARY
GLUCOSE-CAPILLARY: 100 mg/dL — AB (ref 65–99)
GLUCOSE-CAPILLARY: 170 mg/dL — AB (ref 65–99)
Glucose-Capillary: 168 mg/dL — ABNORMAL HIGH (ref 65–99)
Glucose-Capillary: 159 mg/dL — ABNORMAL HIGH (ref 65–99)

## 2015-07-11 LAB — HEPARIN LEVEL (UNFRACTIONATED): HEPARIN UNFRACTIONATED: 0.44 [IU]/mL (ref 0.30–0.70)

## 2015-07-11 LAB — PLATELET INHIBITION P2Y12: Platelet Function  P2Y12: 190 [PRU] — ABNORMAL LOW (ref 194–418)

## 2015-07-11 MED ORDER — MAGIC MOUTHWASH W/LIDOCAINE
5.0000 mL | Freq: Four times a day (QID) | ORAL | Status: DC | PRN
  Administered 2015-07-14 (×2): 5 mL via ORAL
  Filled 2015-07-11 (×3): qty 5

## 2015-07-11 MED ORDER — MAGIC MOUTHWASH
10.0000 mL | Freq: Three times a day (TID) | ORAL | Status: DC
  Administered 2015-07-11 – 2015-07-22 (×19): 10 mL via ORAL
  Filled 2015-07-11 (×26): qty 10

## 2015-07-11 NOTE — Progress Notes (Signed)
Pt c/o of soreness on his tongue.  Two raised dark spots noted on tongue which appear to be blood blisters.  Pharmacy notified, as patient is receiving IV heparin.  No changes to heparin gtt.  Heparin level is within normal limits.  Will continue to monitor.  Jodell Cipro

## 2015-07-11 NOTE — Progress Notes (Signed)
  [facility] Progress Note   Subjective: no c/o  Filed Vitals:   07/10/15 1040 07/10/15 1541 07/10/15 2145 07/11/15 0503  BP: 116/70 113/60 125/53 139/66  Pulse: 69 56 58 62  Temp: 97.1 F (36.2 C) 97.7 F (36.5 C) 97.9 F (36.6 C) 98 F (36.7 C)  TempSrc: Oral Oral Oral Oral  Resp: 19   16  Height:      Weight: 94.5 kg (208 lb 5.4 oz)   95.074 kg (209 lb 9.6 oz)  SpO2:  100% 99% 99%    Inpatient medications: . aspirin EC  81 mg Oral Daily  . calcium acetate  1,334 mg Oral TID WC  . carvedilol  6.25 mg Oral BID WC  . insulin aspart  0-15 Units Subcutaneous TID WC  . insulin glargine  20 Units Subcutaneous QHS  . isosorbide mononitrate  90 mg Oral Daily  . magic mouthwash  10 mL Oral TID  . pregabalin  75 mg Oral BID  . ranolazine  500 mg Oral BID  . rosuvastatin  20 mg Oral QHS  . sodium chloride flush  3 mL Intravenous Q12H  . sodium chloride flush  3 mL Intravenous Q12H   . heparin 1,700 Units/hr (07/09/15 2227)   sodium chloride, sodium chloride, sodium chloride, sodium chloride, acetaminophen, acetaminophen, alteplase, heparin, heparin, lidocaine (PF), lidocaine-prilocaine, magic mouthwash w/lidocaine, nitroGLYCERIN, ondansetron (ZOFRAN) IV, ondansetron (ZOFRAN) IV, pentafluoroprop-tetrafluoroeth, sodium chloride flush, sodium chloride flush  Exam: Alert, no distress No jvd or bruits Chest clear bilat RRR no MRG Abd soft ntnd no mass or ascites +bs Ext no LE or UE edema Alert, Ox 3 , nf LFA AVF +bruit  CXR - no active disease  Dialysis: [facility]  4h  400/600  2/2.5 bath  Hep 2000 then 500/hr   94kg Epo 5000/ hd Ven 50/wk      Assessment: 1. NSTEMI/ hx CABG - cath w high-level ISR of L main and LCx stents.  W/U per TCTS in progress 2. ESRD TTS hd 3. Volume - stable, at dry wt, LVEDP 15 at cath 4. CAD/ hx CABG '01/ L main+LCX stent Jul '16 5. DM insulin 6. Chron low plts 7. 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 ICM  EF 25-30%  Plan - TTS HD, venogram Monday   Kelly Splinter MD [facility] pager [phone]    cell [phone] 07/11/2015, 1:14 PM    Recent Labs Lab 07/06/15 0317 07/10/15 0736 07/10/15 1209  NA 139 135 135  K 4.3 4.5 3.5  CL 106 101 96*  CO2 21* 23 29  GLUCOSE 130* 111* 222*  BUN 70* 48* 18  CREATININE 7.85* 6.19* 3.32*  CALCIUM 8.0* 8.2* 8.1*  PHOS  --  6.1* 2.8    Recent Labs Lab 07/10/15 0736 07/10/15 1209  ALBUMIN 3.6 3.7    Recent Labs Lab 07/09/15 0358 07/10/15 0416 07/11/15 0334  WBC 4.8 5.1 5.1  HGB 10.2* 10.6* 9.8*  HCT 31.7* 33.0* 30.4*  MCV 91.9 91.9 92.4  PLT 71* 67* 64*

## 2015-07-11 NOTE — Progress Notes (Signed)
ANTICOAGULATION CONSULT NOTE - Follow Up Consult  Pharmacy Consult for heparin Indication: CAD s/p cath plan CABG  Allergies  Allergen Reactions  . Sulfa Antibiotics Hives and Itching  . Morphine And Related     Makes patient feel weird    Patient Measurements: Height: 5\' 11"  (180.3 cm) Weight: 209 lb 9.6 oz (95.074 kg) IBW/kg (Calculated) : 75.3 Heparin Dosing Weight: 95 kg  Vital Signs: Temp: 98 F (36.7 C) (06/04 0503) Temp Source: Oral (06/04 0503) BP: 139/66 mmHg (06/04 0503) Pulse Rate: 62 (06/04 0503)  Labs:  Recent Labs  07/09/15 0358 07/10/15 0416 07/10/15 0736 07/10/15 1209 07/11/15 0334  HGB 10.2* 10.6*  --   --  9.8*  HCT 31.7* 33.0*  --   --  30.4*  PLT 71* 67*  --   --  64*  HEPARINUNFRC 0.66 0.57  --   --  0.44  CREATININE  --   --  6.19* 3.32*  --     Estimated Creatinine Clearance: 25.4 mL/min (by C-G formula based on Cr of 3.32).  Assessment: 70 yo Male with multi-vessel CAD pending TCTS eval for redo-CABG.  Pt continues on therapeutic heparin s/p cath 6/1. Ticagrelor on hold - last stent 7/16.  PRU 6/2 103.166>190 Heparin drip 1700 units/hr HL 0.44. H/H stable  - low pltc but chronic and stable Complains of blisters on tongue - ordered MMW  Goal of Therapy:  Heparin level 0.3-0.7 units/ml Monitor platelets by anticoagulation protocol: Yes   Plan:  Continue Heparin at 1700 units/hr  Daily heparin level and CBC while on heparin   Bonnita Nasuti Pharm.D. CPP, BCPS Clinical Pharmacist [phone] 07/11/2015 9:10 AM

## 2015-07-11 NOTE — Progress Notes (Addendum)
Patient Name: Joseph [name] Date of Encounter: 07/11/2015  Principal Problem:   NSTEMI (non-ST elevated myocardial infarction) [facility]) Active Problems:   Type 2 diabetes mellitus with renal manifestations ([place])   Ischemic cardiomyopathy   CAD s/p CABG 2001, prior POBA, PCI/DES July 2016   Chronic combined systolic and diastolic heart failure ([place])   Hypertensive heart disease with heart failure (H[facility])   ESRD (end stage renal disease) ([place])   Dyslipidemia, goal LDL below 70   Pain in the chest   Length of Stay: 5  SUBJECTIVE Walking briskly in the hallway. He has developed some "blood blisters" on his tongue, does not remember biting his tongue. Otherwise no complaints.  CURRENT MEDS . aspirin EC  81 mg Oral Daily  . calcium acetate  1,334 mg Oral TID WC  . carvedilol  6.25 mg Oral BID WC  . insulin aspart  0-15 Units Subcutaneous TID WC  . insulin glargine  20 Units Subcutaneous QHS  . isosorbide mononitrate  90 mg Oral Daily  . magic mouthwash  10 mL Oral TID  . pregabalin  75 mg Oral BID  . ranolazine  500 mg Oral BID  . rosuvastatin  20 mg Oral QHS  . sodium chloride flush  3 mL Intravenous Q12H  . sodium chloride flush  3 mL Intravenous Q12H    OBJECTIVE   Intake/Output Summary (Last 24 hours) at 07/11/15 0933 Last data filed at 07/11/15 [id number]  Gross per 24 hour  Intake 615.13 ml  Output   1200 ml  Net -584.87 ml   Filed Weights   07/10/15 0700 07/10/15 1040 07/11/15 0503  Weight: 95.7 kg (210 lb 15.7 oz) 94.5 kg (208 lb 5.4 oz) 95.074 kg (209 lb 9.6 oz)    PHYSICAL EXAM Filed Vitals:   07/10/15 1040 07/10/15 1541 07/10/15 2145 07/11/15 0503  BP: 116/70 113/60 125/53 139/66  Pulse: 69 56 58 62  Temp: 97.1 F (36.2 C) 97.7 F (36.5 C) 97.9 F (36.6 C) 98 F (36.7 C)  TempSrc: Oral Oral Oral Oral  Resp: 19   16  Height:      Weight: 94.5 kg (208 lb 5.4 oz)   95.074 kg (209 lb 9.6 oz)  SpO2:  100% 99% 99%   General: Alert, oriented x3, no  distress Head: no evidence of trauma, PERRL, EOMI, no exophtalmos or lid lag, no myxedema, no xanthelasma; normal ears, nose and oropharynx Neck: normal jugular venous pulsations and no hepatojugular reflux; brisk carotid pulses without delay and no carotid bruits Chest: clear to auscultation, no signs of consolidation by percussion or palpation, normal fremitus, symmetrical and full respiratory excursions Cardiovascular: normal position and quality of the apical impulse, regular rhythm, normal first and second heart sounds, no rubs or gallops, no murmur Abdomen: no tenderness or distention, no masses by palpation, no abnormal pulsatility or arterial bruits, normal bowel sounds, no hepatosplenomegaly Extremities: no clubbing, cyanosis or edema; 2+ radial, ulnar and brachial pulses bilaterally; 2+ right femoral, posterior tibial and dorsalis pedis pulses; 2+ left femoral, posterior tibial and dorsalis pedis pulses; no subclavian or femoral bruits Neurological: grossly nonfocal  LABS  CBC  Recent Labs  07/10/15 0416 07/11/15 0334  WBC 5.1 5.1  HGB 10.6* 9.8*  HCT 33.0* 30.4*  MCV 91.9 92.4  PLT 67* 64*   Basic Metabolic Panel  Recent Labs  07/10/15 0736 07/10/15 1209  NA 135 135  K 4.5 3.5  CL 101 96*  CO2 23 29  GLUCOSE 111*  222*  BUN 48* 18  CREATININE 6.19* 3.32*  CALCIUM 8.2* 8.1*  PHOS 6.1* 2.8   Liver Function Tests  Recent Labs  07/10/15 0736 07/10/15 1209  ALBUMIN 3.6 3.7    Radiology Studies Imaging results have been reviewed and No results found.  TELE NSR  ASSESSMENT AND PLAN  70 year old male with a past medical history of CAD, status post CABG, ischemic cardiomyopathy and end-stage renal disease. Patient had his last PCI in July 2016. He was admitted in January 2017 and underwent cath which revealed moderate in-stent restenosis of his Lcx and elevated LVED, treated medically. He has been readmitted for recurrent CP. LHC 5/31 showed new severe ISR  within the distal left main and ostial circumflex. Patient is being considered for re-do CABG vs PCI. Has limited conduits available. Venogram tomorrow.  1. CAD: Treatment options are now somewhat limited since there is restenosis in the left main stent. The stent is 2.5 mm in diameter. It will be difficult to expand beyond 3.25 mm in diameter. Restenting will be associated with a 30-50% in-stent restenosis rate within the next 6-12 months. He is being considered for re-do CABG. Dr. Prescott Gum is following. We are still awaiting final decision. He is to have a saphenous venography on Monday. If not a candidate for surgery, will need PCI. He is currently CP free. No dyspnea. Continue medical therapy for now. Brilinta is now on hold for washout, given potential need for surgery.   2. ESRD: on HD. Nephrology following.  3. T2DM: Continue Insulin.   4. DLD: Continue Crestor.  5. HTN: BP is controlled on current regimen.   6. Ischemic Cardiomyopathy: On BB therapy with Coreg. No ACE/ARB given ESRD. On Imdur for afterload reduction. Volume control through HD.   Magic mouthwash for symptom relief, but need to continue heparin barring any serious bleeding.  Sanda Klein, MD, [facility] [phone] office [phone] pager 07/11/2015 9:33 AM

## 2015-07-12 ENCOUNTER — Inpatient Hospital Stay (HOSPITAL_COMMUNITY): Payer: Medicare Other

## 2015-07-12 DIAGNOSIS — I25110 Atherosclerotic heart disease of native coronary artery with unstable angina pectoris: Secondary | ICD-10-CM

## 2015-07-12 LAB — PULMONARY FUNCTION TEST
DL/VA % pred: 72 %
DL/VA: 3.25 ml/min/mmHg/L
DLCO cor % pred: 68 %
DLCO cor: 20.37 ml/min/mmHg
DLCO unc % pred: 57 %
DLCO unc: 17.04 ml/min/mmHg
FEF 25-75 Post: 2.22 L/sec
FEF 25-75 Pre: 1.15 L/sec
FEF2575-%Change-Post: 92 %
FEF2575-%Pred-Post: 91 %
FEF2575-%Pred-Pre: 47 %
FEV1-%Change-Post: 18 %
FEV1-%Pred-Post: 79 %
FEV1-%Pred-Pre: 66 %
FEV1-Post: 2.46 L
FEV1-Pre: 2.07 L
FEV1FVC-%Change-Post: 5 %
FEV1FVC-%Pred-Pre: 91 %
FEV6-%Change-Post: 10 %
FEV6-%Pred-Post: 84 %
FEV6-%Pred-Pre: 76 %
FEV6-Post: 3.33 L
FEV6-Pre: 3.02 L
FEV6FVC-%Change-Post: -1 %
FEV6FVC-%Pred-Post: 104 %
FEV6FVC-%Pred-Pre: 105 %
FVC-%Change-Post: 12 %
FVC-%Pred-Post: 82 %
FVC-%Pred-Pre: 73 %
FVC-Post: 3.45 L
FVC-Pre: 3.06 L
Post FEV1/FVC ratio: 71 %
Post FEV6/FVC ratio: 98 %
Pre FEV1/FVC ratio: 68 %
Pre FEV6/FVC Ratio: 99 %
RV % pred: 120 %
RV: 2.77 L
TLC % pred: 102 %
TLC: 6.79 L

## 2015-07-12 LAB — GLUCOSE, CAPILLARY
GLUCOSE-CAPILLARY: 210 mg/dL — AB (ref 65–99)
Glucose-Capillary: 123 mg/dL — ABNORMAL HIGH (ref 65–99)
Glucose-Capillary: 147 mg/dL — ABNORMAL HIGH (ref 65–99)
Glucose-Capillary: 197 mg/dL — ABNORMAL HIGH (ref 65–99)

## 2015-07-12 LAB — CBC
HEMATOCRIT: 31.8 % — AB (ref 39.0–52.0)
Hemoglobin: 9.9 g/dL — ABNORMAL LOW (ref 13.0–17.0)
MCH: 29 pg (ref 26.0–34.0)
MCHC: 31.1 g/dL (ref 30.0–36.0)
MCV: 93.3 fL (ref 78.0–100.0)
PLATELETS: 58 10*3/uL — AB (ref 150–400)
RBC: 3.41 MIL/uL — AB (ref 4.22–5.81)
RDW: 14.9 % (ref 11.5–15.5)
WBC: 5.6 10*3/uL (ref 4.0–10.5)

## 2015-07-12 LAB — TROPONIN I: TROPONIN I: 0.06 ng/mL — AB (ref <0.031)

## 2015-07-12 LAB — HEPARIN LEVEL (UNFRACTIONATED): Heparin Unfractionated: 0.63 IU/mL (ref 0.30–0.70)

## 2015-07-12 MED ORDER — LIDOCAINE HCL 1 % IJ SOLN
INTRAMUSCULAR | Status: AC
  Administered 2015-07-12: 10 mL
  Filled 2015-07-12: qty 20

## 2015-07-12 MED ORDER — DARBEPOETIN ALFA 60 MCG/0.3ML IJ SOSY
60.0000 ug | PREFILLED_SYRINGE | INTRAMUSCULAR | Status: DC
  Administered 2015-07-13: 60 ug via INTRAVENOUS
  Filled 2015-07-12: qty 0.3

## 2015-07-12 MED ORDER — ALPRAZOLAM 0.5 MG PO TABS
0.5000 mg | ORAL_TABLET | Freq: Two times a day (BID) | ORAL | Status: DC | PRN
  Administered 2015-07-12 – 2015-07-13 (×2): 0.5 mg via ORAL
  Filled 2015-07-12 (×2): qty 1

## 2015-07-12 MED ORDER — IOHEXOL 300 MG/ML  SOLN
30.0000 mL | Freq: Once | INTRAMUSCULAR | Status: DC | PRN
Start: 2015-07-12 — End: 2015-07-15

## 2015-07-12 MED ORDER — LIDOCAINE HCL 1 % IJ SOLN
INTRAMUSCULAR | Status: AC
  Filled 2015-07-12: qty 20

## 2015-07-12 MED ORDER — RENA-VITE PO TABS
1.0000 | ORAL_TABLET | Freq: Every day | ORAL | Status: DC
  Administered 2015-07-12 – 2015-07-23 (×7): 1 via ORAL
  Filled 2015-07-12 (×8): qty 1

## 2015-07-12 MED ORDER — ALBUTEROL SULFATE (2.5 MG/3ML) 0.083% IN NEBU
2.5000 mg | INHALATION_SOLUTION | Freq: Once | RESPIRATORY_TRACT | Status: AC
  Administered 2015-07-12: 2.5 mg via RESPIRATORY_TRACT

## 2015-07-12 NOTE — Progress Notes (Signed)
S: No SOB No edema O:BP 158/76 mmHg  Pulse 68  Temp(Src) 97.9 F (36.6 C) (Oral)  Resp 16  Ht 5\' 11"  (1.803 m)  Wt 96.344 kg (212 lb 6.4 oz)  BMI 29.64 kg/m2  SpO2 100%  Intake/Output Summary (Last 24 hours) at 07/12/15 0650 Last data filed at 07/11/15 2029  Gross per 24 hour  Intake 743.22 ml  Output      0 ml  Net 743.22 ml   Weight change: 1.844 kg (4 lb 1 oz) Gen: awake and alert CVS: RRR 1/6 systolic M Resp: clear Abd:+ BS NTND Ext: No edema Lt forearm AVF + bruit NEURO: CNI Ox3 no asterixis   . aspirin EC  81 mg Oral Daily  . calcium acetate  1,334 mg Oral TID WC  . carvedilol  6.25 mg Oral BID WC  . insulin aspart  0-15 Units Subcutaneous TID WC  . insulin glargine  20 Units Subcutaneous QHS  . isosorbide mononitrate  90 mg Oral Daily  . magic mouthwash  10 mL Oral TID  . pregabalin  75 mg Oral BID  . ranolazine  500 mg Oral BID  . rosuvastatin  20 mg Oral QHS  . sodium chloride flush  3 mL Intravenous Q12H  . sodium chloride flush  3 mL Intravenous Q12H   No results found. BMET    Component Value Date/Time   NA 135 07/10/2015 1209   NA 143 09/11/2014 1549   NA 143 01/05/2014 1334   NA 139 02/04/2008 1320   K 3.5 07/10/2015 1209   K 5.5* 01/05/2014 1334   K 4.3 02/04/2008 1320   CL 96* 07/10/2015 1209   CL 112* 01/05/2014 1334   CL 102 02/04/2008 1320   CO2 29 07/10/2015 1209   CO2 21 01/05/2014 1334   CO2 28 02/04/2008 1320   GLUCOSE 222* 07/10/2015 1209   GLUCOSE 200* 09/11/2014 1549   GLUCOSE 104* 01/05/2014 1334   GLUCOSE 240* 02/04/2008 1320   BUN 18 07/10/2015 1209   BUN 57* 09/11/2014 1549   BUN 88* 01/05/2014 1334   BUN 18 02/04/2008 1320   CREATININE 3.32* 07/10/2015 1209   CREATININE 3.52* 05/26/2014 1024   CREATININE 1.3* 02/04/2008 1320   CALCIUM 8.1* 07/10/2015 1209   CALCIUM 7.5* 11/06/2014 0421   CALCIUM 6.9* 05/26/2014 1024   CALCIUM 8.8 02/04/2008 1320   GFRNONAA 18* 07/10/2015 1209   GFRNONAA 17* 05/26/2014 1024   GFRNONAA 19* 01/05/2014 1334   GFRAA 21* 07/10/2015 1209   GFRAA 20* 05/26/2014 1024   GFRAA 23* 01/05/2014 1334   CBC    Component Value Date/Time   WBC 5.6 07/12/2015 0430   WBC 4.9 03/18/2015 1539   WBC 4.7 05/26/2014 1024   RBC 3.41* 07/12/2015 0430   RBC 2.91* 03/18/2015 1539   RBC 3.02* 11/06/2014 0421   RBC 3.61* 05/26/2014 1024   RBC 5.35 03/23/2008 1117   HGB 9.9* 07/12/2015 0430   HGB 10.2* 05/26/2014 1024   HGB 15.8 03/23/2008 1117   HCT 31.8* 07/12/2015 0430   HCT 25.8* 03/18/2015 1539   HCT 30.6* 05/26/2014 1024   HCT 46.2 03/23/2008 1117   PLT 58* 07/12/2015 0430   PLT 99* 03/18/2015 1539   PLT 80* 05/26/2014 1024   PLT 93* 03/23/2008 1117   MCV 93.3 07/12/2015 0430   MCV 89 03/18/2015 1539   MCV 85 05/26/2014 1024   MCV 86 03/23/2008 1117   MCH 29.0 07/12/2015 0430   MCH 29.2 03/18/2015  1539   MCH 28.3 05/26/2014 1024   MCH 29.5 03/23/2008 1117   MCHC 31.1 07/12/2015 0430   MCHC 32.9 03/18/2015 1539   MCHC 33.4 05/26/2014 1024   MCHC 34.1 03/23/2008 1117   RDW 14.9 07/12/2015 0430   RDW 16.8* 03/18/2015 1539   RDW 15.7* 05/26/2014 1024   RDW 12.7 03/23/2008 1117   LYMPHSABS 0.7* 05/25/2015 1343   LYMPHSABS 0.6* 09/11/2014 0000   LYMPHSABS 0.7* 05/26/2014 1024   LYMPHSABS 1.3 03/23/2008 1117   MONOABS 0.4 05/25/2015 1343   MONOABS 0.3 05/26/2014 1024   EOSABS 0.0 05/25/2015 1343   EOSABS 0.1 09/11/2014 0000   EOSABS 0.1 05/26/2014 1024   EOSABS 0.1 03/23/2008 1117   BASOSABS 0.0 05/25/2015 1343   BASOSABS 0.0 09/11/2014 0000   BASOSABS 0.0 05/26/2014 1024   BASOSABS 0.1 03/23/2008 1117     Assessment: 1. NSTEMI 2. DM 3. Anemia 4. HTN 5. ESRD  TTS 6. thrombocytopenia  Plan: 1. Plan HD in AM 2. Start renavite 3. Start aranesp    Tod Abrahamsen T

## 2015-07-12 NOTE — Plan of Care (Signed)
Problem: Education: Goal: Knowledge of [facility] Education information/materials will improve Outcome: Progressing The client still need further testing to determine if he will need a PCI or a CABG. We have talked about both procedures and I have answered any questions that have been asked. I have also educated on safety, pain rating scale, testing,treatment, and procedures.

## 2015-07-12 NOTE — Care Management Important Message (Signed)
Important Message  Patient Details  Name: Joseph [name] MRN: [id number] Date of Birth: 03-May-1945   Medicare Important Message Given:  Yes    [name] May 07/12/2015, 12:09 PM

## 2015-07-12 NOTE — Progress Notes (Addendum)
Subjective: No chest pain, no SOB waiting for results and Dr. Darcey Nora  Objective: Vital signs in last 24 hours: Temp:  [97.5 F (36.4 C)-97.9 F (36.6 C)] 97.9 F (36.6 C) (06/05 0429) Pulse Rate:  [59-68] 62 (06/05 0848) Resp:  [16-18] 18 (06/05 0848) BP: (120-158)/(57-76) 158/76 mmHg (06/05 0848) SpO2:  [99 %-100 %] 100 % (06/05 0848) Weight:  [212 lb 6.4 oz (96.344 kg)] 212 lb 6.4 oz (96.344 kg) (06/05 0429) Weight change: 4 lb 1 oz (1.844 kg) Last BM Date: 07/11/15 Intake/Output from previous day: +743 06/04 0701 - 06/05 0700 In: 743.2 [P.O.:480; I.V.:263.2] Out: -  Intake/Output this shift: Total I/O In: 3 [I.V.:3] Out: -   [id number] affect, NAD Skin:Warm and dry, brisk capillary refill HEENT:normocephalic, sclera clear, mucus membranes moist, blisters on cheek and tongue- magic mouthwash ordered yesterday Neck:supple, no JVD  Heart:S1S2 RRR with soft systolic murmur, no gallup, rub or click Lungs:clear without rales, rhonchi, or wheezes [id number], non tender, + BS, do not palpate liver spleen or masses Ext:no lower ext edema, 2+ pedal pulses, 2+ radial pulses Neuro:alert and oriented X 3, MAE, follows commands, + facial symmetry  Lab Results:  Recent Labs  07/11/15 0334 07/12/15 0430  WBC 5.1 5.6  HGB 9.8* 9.9*  HCT 30.4* 31.8*  PLT 64* 58*   BMET  Recent Labs  07/10/15 0736 07/10/15 1209  NA 135 135  K 4.5 3.5  CL 101 96*  CO2 23 29  GLUCOSE 111* 222*  BUN 48* 18  CREATININE 6.19* 3.32*  CALCIUM 8.2* 8.1*   No results for input(s): TROPONINI in the last 72 hours.  Invalid input(s): CK, MB  Lab Results  Component Value Date   CHOL 88 07/06/2015   HDL 26* 07/06/2015   LDLCALC 25 07/06/2015   TRIG 184* 07/06/2015   CHOLHDL 3.4 07/06/2015   Lab Results  Component Value Date   HGBA1C 6.9* 06/25/2015     Lab Results  Component Value Date   TSH 2.026 11/04/2014    Hepatic Function Panel  Recent Labs  07/10/15 1209  ALBUMIN 3.7   No results for input(s): CHOL in the last 72 hours. No results for input(s): PROTIME in the last 72 hours.  Echocardiogram: 04/26/15 - Left ventricle: The cavity size was moderately dilated. There was  mild concentric hypertrophy. Systolic function was mildly to  moderately reduced. The estimated ejection fraction was in the  range of 40% to 45%. Mild hypokinesis of the inferolateral and  inferior myocardium. Features are consistent with a pseudonormal  left ventricular filling pattern, with concomitant abnormal  relaxation and increased filling pressure (grade 2 diastolic  dysfunction). - Aortic valve: Trileaflet; normal thickness, mildly calcified  leaflets. There was trivial regurgitation. - Mitral valve: There was mild regurgitation. - Left atrium: The atrium was moderately to severely dilated. - Pulmonary arteries: Systolic pressure was within the normal  range.   Studies/Results: Cardiac Cath 06/14/2015 Conclusion    1. Prox RCA lesion, 100% stenosed. 2. Ost LAD lesion, 100% stenosed. 3. LIMA . 4. Widely patent LIMA. The graft inserts in the mid-LAD and supplies the proximal LAD and diagonals in retrograde fashion. The right PDA is also collateralized from septals of the LAD 5. Ost Cx lesion, 60% stenosed. The lesion was previously treated with a stent (unknown type). 6. Ost LM to LM lesion, 50% stenosed.   Severe in-stent restenosis within the distal left main and ostial circumflex. This stent was  previously placed in July 2016. 2 moderate size obtuse marginal branches arise distal to the stented segment.  Total occlusion of the native right coronary and ostial LAD.   Total occlusion of all saphenous vein grafts.   Patent LIMA to LAD. The native LAD supplies collaterals to the PDA and circumflex territory.  RECOMMENDATIONS:    Treatment options and now somewhat limited since there is restenosis in the left main stent. The  stent is 2.5 mm in diameter. It will be difficult to expand beyond 3.25 mm in diameter. Restenting will be associated with a 30-50% in-stent restenosis rate within the next 6-12 months. This may be our only treatment option going for if the patient is not a candidate for redo CABG.   TCTS consult to document whether the patient has revascularization options other than repeat PCI.       Medications: I have reviewed the patient's current medications. Scheduled Meds: . albuterol  2.5 mg Nebulization Once  . aspirin EC  81 mg Oral Daily  . calcium acetate  1,334 mg Oral TID WC  . carvedilol  6.25 mg Oral BID WC  . [START ON 07/13/2015] darbepoetin (ARANESP) injection - DIALYSIS  60 mcg Intravenous Q Tue-HD  . insulin aspart  0-15 Units Subcutaneous TID WC  . insulin glargine  20 Units Subcutaneous QHS  . isosorbide mononitrate  90 mg Oral Daily  . lidocaine      . magic mouthwash  10 mL Oral TID  . multivitamin  1 tablet Oral QHS  . pregabalin  75 mg Oral BID  . ranolazine  500 mg Oral BID  . rosuvastatin  20 mg Oral QHS  . sodium chloride flush  3 mL Intravenous Q12H  . sodium chloride flush  3 mL Intravenous Q12H   Continuous Infusions: . heparin 1,700 Units/hr (07/12/15 0900)   PRN Meds:.sodium chloride, sodium chloride, sodium chloride, sodium chloride, acetaminophen, acetaminophen, alteplase, heparin, heparin, iohexol, lidocaine (PF), lidocaine-prilocaine, magic mouthwash w/lidocaine, nitroGLYCERIN, ondansetron (ZOFRAN) IV, ondansetron (ZOFRAN) IV, pentafluoroprop-tetrafluoroeth, sodium chloride flush, sodium chloride flush  Assessment/Plan: Principal Problem:   NSTEMI (non-ST elevated myocardial infarction) ([place]) Active Problems:   Type 2 diabetes mellitus with renal manifestations ([place])   Ischemic cardiomyopathy   CAD s/p CABG 2001, prior POBA, PCI/DES July 2016   Chronic combined systolic and diastolic heart failure ([place])   Hypertensive heart disease with heart failure  ([facility])   ESRD (end stage renal disease) ([facility])   Dyslipidemia, goal LDL below 70   Pain in the chest  70 year old male with a past medical history of CAD, status post CABG, ischemic cardiomyopathy and end-stage renal disease. Patient had his last PCI in July 2016. He was admitted in January 2017 and underwent cath which revealed moderate in-stent restenosis of his Lcx and elevated LVED, treated medically. He has been readmitted for recurrent CP. LHC 5/31 showed new severe ISR within the distal left main and ostial circumflex. Patient is being considered for re-do CABG vs PCI. Has limited conduits available. Venogram tomorrow.  1. CAD: Treatment options are now somewhat limited since there is restenosis in the left main stent. The stent is 2.5 mm in diameter. It will be difficult to expand beyond 3.25 mm in diameter. Restenting will be associated with a 30-50% in-stent restenosis rate within the next 6-12 months. He is being considered for re-do CABG. Dr. Prescott Gum is following. We are still awaiting final decision. He has had saphenous venography Today RESULTS PENDING. If not a candidate for  surgery, will need PCI. He is currently CP free. No dyspnea. Continue medical therapy for now. Brilinta is now on hold for washout, given potential need for surgery.  -PFTs pending as well -continue Heparin   2. ESRD: on HD. Nephrology following. HD in AM.   3. T2DM: Continue Insulin. Glucose is 123- 170 stable  4. DLD: Continue Crestor.  5. HTN: BP is controlled on current regimen. 158/76 lower post dialysis  6. Ischemic Cardiomyopathy: On BB therapy with Coreg. No ACE/ARB given ESRD. On Imdur for afterload reduction. Volume control through HD.   7. Anemia- placed on aranesp.  8.  Mouth ulcers/ blisters magic mouthwash has been started.     LOS: 6 days   Time spent with pt. : 15 minutes. Cecilie Kicks  Nurse Practitioner Certified Pager [phone] or after 5pm and on weekends call [phone] 07/12/2015,  12:38 PM   Personally seen and examined. Agree with above. Restenosis and left main stent-Dr. Prescott Gum in consultation. Multiple comorbidities Continuing heparin drip until final plan in place.  Candee Furbish, MD

## 2015-07-12 NOTE — Progress Notes (Signed)
ANTICOAGULATION CONSULT NOTE - Follow Up Consult  Pharmacy Consult for heparin Indication: CAD s/p cath plan CABG  Allergies  Allergen Reactions  . Sulfa Antibiotics Hives and Itching  . Morphine And Related     Makes patient feel weird    Patient Measurements: Height: 5\' 11"  (180.3 cm) Weight: 212 lb 6.4 oz (96.344 kg) IBW/kg (Calculated) : 75.3 Heparin Dosing Weight: 95 kg  Vital Signs: Temp: 97.9 F (36.6 C) (06/05 0429) Temp Source: Oral (06/05 0429) BP: 158/76 mmHg (06/05 0848) Pulse Rate: 62 (06/05 0848)  Labs:  Recent Labs  07/10/15 0416 07/10/15 0736 07/10/15 1209 07/11/15 0334 07/12/15 0430  HGB 10.6*  --   --  9.8* 9.9*  HCT 33.0*  --   --  30.4* 31.8*  PLT 67*  --   --  64* 58*  HEPARINUNFRC 0.57  --   --  0.44 0.63  CREATININE  --  6.19* 3.32*  --   --     Estimated Creatinine Clearance: 25.6 mL/min (by C-G formula based on Cr of 3.32).  Assessment: 70 yo Male with multi-vessel CAD pending TCTS eval for redo-CABG.  Pt continues on therapeutic heparin s/p cath 6/1. Ticagrelor on hold - last stent 7/16.  PRU 6/2 103.166>190 Heparin drip 1700 units/hr with therapeutic level at 0.63. H/H  - low pltc but chronic  Goal of Therapy:  Heparin level 0.3-0.7 units/ml Monitor platelets by anticoagulation protocol: Yes   Plan:  Continue Heparin at 1700 units/hr  Daily heparin level and CBC while on heparin   Thank you for allowing [place] to participate in this patients care. Jens Som, PharmD Pager: [phone]  07/12/2015 12:56 PM

## 2015-07-12 NOTE — Progress Notes (Signed)
5 Days Post-Op Procedure(s) (LRB): Left Heart Cath and Cors/Grafts Angiography (N/A) Subjective: Results of RLE venogram images reviewed - appears to have adequate conduit between RIMA and  Remaining R greater saphenous vein  However the patients platelet count ha decreased 85k >> 55 k after heparin was started. Will need to make sure patient is HIT negative before redo CABG. Will recheck P2Y12 tomorrow  Objective: Vital signs in last 24 hours: Temp:  [97.5 F (36.4 C)-98 F (36.7 C)] 98 F (36.7 C) (06/05 1438) Pulse Rate:  [59-68] 68 (06/05 1438) Cardiac Rhythm:  [-] Normal sinus rhythm;Heart block (06/05 0830) Resp:  [17-18] 17 (06/05 1438) BP: (120-158)/(60-76) 146/74 mmHg (06/05 1438) SpO2:  [100 %] 100 % (06/05 1438) Weight:  [212 lb 6.4 oz (96.344 kg)] 212 lb 6.4 oz (96.344 kg) (06/05 0429)  Hemodynamic parameters for last 24 hours:    Intake/Output from previous day: 06/04 0701 - 06/05 0700 In: 743.2 [P.O.:480; I.V.:263.2] Out: -  Intake/Output this shift: Total I/O In: 243 [P.O.:240; I.V.:3] Out: -     Lab Results:  Recent Labs  07/11/15 0334 07/12/15 0430  WBC 5.1 5.6  HGB 9.8* 9.9*  HCT 30.4* 31.8*  PLT 64* 58*   BMET:  Recent Labs  07/10/15 0736 07/10/15 1209  NA 135 135  K 4.5 3.5  CL 101 96*  CO2 23 29  GLUCOSE 111* 222*  BUN 48* 18  CREATININE 6.19* 3.32*  CALCIUM 8.2* 8.1*    PT/INR: No results for input(s): LABPROT, INR in the last 72 hours. ABG    Component Value Date/Time   TCO2 22 03/09/2015 2330   CBG (last 3)   Recent Labs  07/11/15 2137 07/12/15 0731 07/12/15 1149  GLUCAP 159* 123* 147*    Assessment/Plan: S/P Procedure(s) (LRB): Left Heart Cath and Cors/Grafts Angiography (N/A) High risk redo CABG later this week pending HIT assay May need Heme consult   LOS: 6 days    Joseph [name] 07/12/2015

## 2015-07-12 NOTE — Clinical Documentation Improvement (Signed)
Cardiology and/or Associates or Consultants  Please document query responses in the progress notes and discharge summary, not on the CDI BPA form in [facility]. Please do not deactivate queries without responding to them. Thank you!  "Anemia" is documented in the nephrology progress note dated 07/12/15 by Dr. Mercy Moore.  Aranesp has been started per progress note.  Patient has a known history of ESRD requiring hemodialysis.  Please document the Acuity and Type of anemia, including any associated causes and/or conditions:  - Anemia of chronic kidney disease  - Other type of anemia  - Unable to clinically determine    Please exercise your independent, professional judgment when responding. A specific answer is not anticipated or expected.   Thank You, Erling Conte  RN BSN [facility] [place]

## 2015-07-12 NOTE — Progress Notes (Signed)
CARDIAC REHAB PHASE I   PRE:  Rate/Rhythm: 60 SR    BP: sitting 122/62    SaO2:   MODE:  Ambulation: 1000 ft   POST:  Rate/Rhythm: 79 SR    BP: sitting 187/73     SaO2: 98 RA  Pt eager to walk, quick pace (his normal). Pt suddenly c/o angina after 800 ft. Sts 4/10. Pt rested in hall (standing) then slowly walked back to room and to recliner. BP elevated. Pt wanted to rest first before NTG. After 5 min angina 2/10. Pt will call RN if it is not resolved in 5 min. Pt sts he had similar episode yesterday after walking about 1000 ft. He will need to shorten his walks to 500 ft, which I told him. Also he should slow down. Will f/u tomorrow. Encouraged IS however pt is inspiring 2500 mL. [place], ACSM 07/12/2015 3:36 PM

## 2015-07-12 NOTE — Progress Notes (Signed)
Patient started having bleeding from his fistula site on the right forearm, was unable to stop. Called [facility] (HD) they came up and applied pressure and applied a surgifoam dressing to site, after 15 minutes of pressure bleeding slowed down.Dr. Nils Pyle aware of bleeding.  Around 1818 patient starting having chest pain 7/10 after speaking with Dr. Lawson Fiscal, BP 183/78 gave 1 nitro, and O2 @ 2L, @ 1823 cp still 7/10 BP 161/76, Around 1831 CP a 2/10 160/64. Paged Junie Panning, NP to bedside, patient CP now 0/10. Also ordered Xanax 0.5 , patient given a dose to help, pt and family think Chest pain was related to anxiety over surgery news from Dr. Nils Pyle.Currently chest pain free, will continue to monitor.Daughter still at bedside.

## 2015-07-13 DIAGNOSIS — R162 Hepatomegaly with splenomegaly, not elsewhere classified: Secondary | ICD-10-CM

## 2015-07-13 DIAGNOSIS — D691 Qualitative platelet defects: Secondary | ICD-10-CM

## 2015-07-13 DIAGNOSIS — D696 Thrombocytopenia, unspecified: Secondary | ICD-10-CM

## 2015-07-13 LAB — RENAL FUNCTION PANEL
ALBUMIN: 3.4 g/dL — AB (ref 3.5–5.0)
Anion gap: 10 (ref 5–15)
BUN: 61 mg/dL — AB (ref 6–20)
CALCIUM: 8.5 mg/dL — AB (ref 8.9–10.3)
CO2: 23 mmol/L (ref 22–32)
Chloride: 103 mmol/L (ref 101–111)
Creatinine, Ser: 6.63 mg/dL — ABNORMAL HIGH (ref 0.61–1.24)
GFR calc Af Amer: 9 mL/min — ABNORMAL LOW (ref >60)
GFR calc non Af Amer: 8 mL/min — ABNORMAL LOW (ref >60)
GLUCOSE: 113 mg/dL — AB (ref 65–99)
PHOSPHORUS: 5.6 mg/dL — AB (ref 2.5–4.6)
Potassium: 4.3 mmol/L (ref 3.5–5.1)
SODIUM: 136 mmol/L (ref 135–145)

## 2015-07-13 LAB — CBC
HCT: 29 % — ABNORMAL LOW (ref 39.0–52.0)
Hemoglobin: 9.3 g/dL — ABNORMAL LOW (ref 13.0–17.0)
MCH: 29.6 pg (ref 26.0–34.0)
MCHC: 32.1 g/dL (ref 30.0–36.0)
MCV: 92.4 fL (ref 78.0–100.0)
PLATELETS: 56 10*3/uL — AB (ref 150–400)
RBC: 3.14 MIL/uL — ABNORMAL LOW (ref 4.22–5.81)
RDW: 15 % (ref 11.5–15.5)
WBC: 4.9 10*3/uL (ref 4.0–10.5)

## 2015-07-13 LAB — GLUCOSE, CAPILLARY
GLUCOSE-CAPILLARY: 106 mg/dL — AB (ref 65–99)
Glucose-Capillary: 114 mg/dL — ABNORMAL HIGH (ref 65–99)
Glucose-Capillary: 164 mg/dL — ABNORMAL HIGH (ref 65–99)

## 2015-07-13 LAB — TROPONIN I
TROPONIN I: 0.07 ng/mL — AB (ref <0.031)
Troponin I: 0.08 ng/mL — ABNORMAL HIGH (ref <0.031)

## 2015-07-13 LAB — HEPARIN LEVEL (UNFRACTIONATED): Heparin Unfractionated: 0.57 IU/mL (ref 0.30–0.70)

## 2015-07-13 LAB — SAVE SMEAR

## 2015-07-13 LAB — PLATELET INHIBITION P2Y12: Platelet Function  P2Y12: 224 [PRU] (ref 194–418)

## 2015-07-13 LAB — HEPARIN INDUCED PLATELET AB (HIT ANTIBODY): Heparin Induced Plt Ab: 0.167 OD (ref 0.000–0.400)

## 2015-07-13 MED ORDER — DARBEPOETIN ALFA 60 MCG/0.3ML IJ SOSY
PREFILLED_SYRINGE | INTRAMUSCULAR | Status: AC
  Filled 2015-07-13: qty 0.3

## 2015-07-13 NOTE — Progress Notes (Signed)
ANTICOAGULATION CONSULT NOTE - Follow Up Consult  Pharmacy Consult for Heparin Indication: severe in-stent restenosis awaiting redo CABG  Allergies  Allergen Reactions  . Sulfa Antibiotics Hives and Itching  . Morphine And Related     Makes patient feel weird    Patient Measurements: Height: 5\' 11"  (180.3 cm) Weight: 214 lb 3.2 oz (97.16 kg) IBW/kg (Calculated) : 75.3 Heparin Dosing Weight: 95kg  Vital Signs: Temp: 97.7 F (36.5 C) (06/06 0500) Temp Source: Oral (06/06 0500) BP: 139/63 mmHg (06/06 0500) Pulse Rate: 56 (06/06 0500)  Labs:  Recent Labs  07/10/15 1209  07/11/15 0334 07/12/15 0430 07/12/15 2151 07/13/15 0040 07/13/15 0650  HGB  --   < > 9.8* 9.9*  --   --  9.3*  HCT  --   --  30.4* 31.8*  --   --  29.0*  PLT  --   --  64* 58*  --   --  56*  HEPARINUNFRC  --   --  0.44 0.63  --   --  0.57  CREATININE 3.32*  --   --   --   --   --  6.63*  TROPONINI  --   --   --   --  0.06* 0.07* 0.08*  < > = values in this interval not displayed.  Estimated Creatinine Clearance: 12.9 mL/min (by C-G formula based on Cr of 6.63).   Medications:  Heparin @ 1700 units/hr  Assessment: 69yom with hx CABG and stents to left main and ostial circumflex, admitted with NSTEMI and started on heparin. Had cath 5/31 which showed severe in-stent restenosis of his left main and ostial circumflex as well as total occlusion of his native RCA, ostial LAD, and all saphenous vein grafts. Heparin was resumed post-cath with plans for redo CABG. Heparin level is therapeutic at 0.57. Platelets low and decreased to 56 today - Dr. Darcey Nora sent HIT panel on 6/5 - will need to be negative before proceeding with surgery.  Goal of Therapy:  Heparin level 0.3-0.7 units/ml Monitor platelets by anticoagulation protocol: Yes   Plan:  1) Continue heparin at 1700 units/hr 2) Daily heparin level and CBC 3) Follow up HIT panel  Deboraha Sprang 07/13/2015,10:48 AM

## 2015-07-13 NOTE — Progress Notes (Signed)
Subjective: No chest pain, no SOB, currently in hemodialysis Objective: Vital signs in last 24 hours: Temp:  [96.8 F (36 C)-98 F (36.7 C)] 96.8 F (36 C) (06/06 1415) Pulse Rate:  [56-62] 56 (06/06 1530) Resp:  [10-21] 17 (06/06 1530) BP: (135-189)/(48-85) 150/66 mmHg (06/06 1530) SpO2:  [96 %-99 %] 98 % (06/06 1415) Weight:  [214 lb 3.2 oz (97.16 kg)-215 lb 6.2 oz (97.7 kg)] 215 lb 6.2 oz (97.7 kg) (06/06 1415) Weight change: 1 lb 12.8 oz (0.816 kg) Last BM Date: 07/13/15 Intake/Output from previous day: +743 06/05 0701 - 06/06 0700 In: 995 [P.O.:720; I.V.:275] Out: -  Intake/Output this shift: Total I/O In: 240 [P.O.:240] Out: -   [id number] affect, NAD Skin:Warm and dry, brisk capillary refill HEENT:normocephalic, sclera clear, mucus membranes moist, blisters on cheek and tongue- magic mouthwash ordered yesterday Neck:supple, no JVD  Heart:S1S2 RRR with soft systolic murmur, no gallup, rub or click Lungs:clear without rales, rhonchi, or wheezes [id number], non tender, + BS, do not palpate liver spleen or masses Ext:no lower ext edema, 2+ pedal pulses, 2+ radial pulses Neuro:alert and oriented X 3, MAE, follows commands, + facial symmetry  Lab Results:  Recent Labs  07/12/15 0430 07/13/15 0650  WBC 5.6 4.9  HGB 9.9* 9.3*  HCT 31.8* 29.0*  PLT 58* 56*   BMET  Recent Labs  07/13/15 0650  NA 136  K 4.3  CL 103  CO2 23  GLUCOSE 113*  BUN 61*  CREATININE 6.63*  CALCIUM 8.5*    Recent Labs  07/13/15 0040 07/13/15 0650  TROPONINI 0.07* 0.08*    Lab Results  Component Value Date   CHOL 88 07/06/2015   HDL 26* 07/06/2015   LDLCALC 25 07/06/2015   TRIG 184* 07/06/2015   CHOLHDL 3.4 07/06/2015   Lab Results  Component Value Date   HGBA1C 6.9* 06/19/2015     Lab Results  Component Value Date   TSH 2.026 11/04/2014    Hepatic Function Panel  Recent Labs  07/13/15 0650  ALBUMIN 3.4*   No results for input(s): CHOL  in the last 72 hours. No results for input(s): PROTIME in the last 72 hours.  Echocardiogram: 04/26/15 - Left ventricle: The cavity size was moderately dilated. There was  mild concentric hypertrophy. Systolic function was mildly to  moderately reduced. The estimated ejection fraction was in the  range of 40% to 45%. Mild hypokinesis of the inferolateral and  inferior myocardium. Features are consistent with a pseudonormal  left ventricular filling pattern, with concomitant abnormal  relaxation and increased filling pressure (grade 2 diastolic  dysfunction). - Aortic valve: Trileaflet; normal thickness, mildly calcified  leaflets. There was trivial regurgitation. - Mitral valve: There was mild regurgitation. - Left atrium: The atrium was moderately to severely dilated. - Pulmonary arteries: Systolic pressure was within the normal  range.   Studies/Results: Cardiac Cath 06/29/2015 Conclusion    1. Prox RCA lesion, 100% stenosed. 2. Ost LAD lesion, 100% stenosed. 3. LIMA . 4. Widely patent LIMA. The graft inserts in the mid-LAD and supplies the proximal LAD and diagonals in retrograde fashion. The right PDA is also collateralized from septals of the LAD 5. Ost Cx lesion, 60% stenosed. The lesion was previously treated with a stent (unknown type). 6. Ost LM to LM lesion, 50% stenosed.   Severe in-stent restenosis within the distal left main and ostial circumflex. This stent was previously placed in July 2016. 2 moderate size obtuse  marginal branches arise distal to the stented segment.  Total occlusion of the native right coronary and ostial LAD.   Total occlusion of all saphenous vein grafts.   Patent LIMA to LAD. The native LAD supplies collaterals to the PDA and circumflex territory.  RECOMMENDATIONS:    Treatment options and now somewhat limited since there is restenosis in the left main stent. The stent is 2.5 mm in diameter. It will be difficult to expand  beyond 3.25 mm in diameter. Restenting will be associated with a 30-50% in-stent restenosis rate within the next 6-12 months. This may be our only treatment option going for if the patient is not a candidate for redo CABG.   TCTS consult to document whether the patient has revascularization options other than repeat PCI.       Medications: I have reviewed the patient's current medications. Scheduled Meds: . aspirin EC  81 mg Oral Daily  . calcium acetate  1,334 mg Oral TID WC  . carvedilol  6.25 mg Oral BID WC  . darbepoetin (ARANESP) injection - DIALYSIS  60 mcg Intravenous Q Tue-HD  . insulin aspart  0-15 Units Subcutaneous TID WC  . insulin glargine  20 Units Subcutaneous QHS  . isosorbide mononitrate  90 mg Oral Daily  . magic mouthwash  10 mL Oral TID  . multivitamin  1 tablet Oral QHS  . pregabalin  75 mg Oral BID  . ranolazine  500 mg Oral BID  . rosuvastatin  20 mg Oral QHS  . sodium chloride flush  3 mL Intravenous Q12H  . sodium chloride flush  3 mL Intravenous Q12H   Continuous Infusions: . heparin 1,700 Units/hr (07/13/15 0201)   PRN Meds:.sodium chloride, sodium chloride, sodium chloride, sodium chloride, acetaminophen, acetaminophen, ALPRAZolam, alteplase, heparin, heparin, iohexol, lidocaine (PF), lidocaine-prilocaine, magic mouthwash w/lidocaine, nitroGLYCERIN, ondansetron (ZOFRAN) IV, ondansetron (ZOFRAN) IV, pentafluoroprop-tetrafluoroeth, sodium chloride flush, sodium chloride flush  Assessment/Plan: Principal Problem:   NSTEMI (non-ST elevated myocardial infarction) ([place]) Active Problems:   Type 2 diabetes mellitus with renal manifestations ([place])   Ischemic cardiomyopathy   CAD s/p CABG 2001, prior POBA, PCI/DES July 2016   Chronic combined systolic and diastolic heart failure ([place])   Hypertensive heart disease with heart failure ([facility])   ESRD (end stage renal disease) ([facility])   Dyslipidemia, goal LDL below 70   Pain in the chest  70 year old male with a  past medical history of CAD, status post CABG, ischemic cardiomyopathy and end-stage renal disease. Patient had his last PCI in July 2016. He was admitted in January 2017 and underwent cath which revealed moderate in-stent restenosis of his Lcx and elevated LVED, treated medically. He has been readmitted for recurrent CP. LHC 5/31 showed new severe ISR within the distal left main and ostial circumflex. Patient is being considered for high risk re-do CABG. Has limited conduits available, however RIMA and right saphenous vein seems viable.   1. CAD: Treatment options are now somewhat limited since there is restenosis in the left main stent. The stent is 2.5 mm in diameter. It will be difficult to expand beyond 3.25 mm in diameter. Restenting will be associated with a 30-50% in-stent restenosis rate within the next 6-12 months. Brilinta is now on hold for washout, given potential need for surgery.Appreciate Dr. Prescott Gum consultation. There is concerned about decreased platelet count of 85,000 down to 55,000 after heparin was started. He ordered HIT assay. Potential need for hematology consult. He does have a history of thrombocytopenia with platelets  as low as 61,000 on 11/07/14 in review of prior notes.  Has right greater saphenous vein as well as adequate right internal mammary artery.  2. ESRD: on HD. Nephrology following. HD in AM.   3. T2DM: Continue Insulin. Glucose is 123- 170 stable  4. DLD: Continue Crestor.  5. HTN: BP is controlled on current regimen. 158/76 lower post dialysis  6. Ischemic Cardiomyopathy: On BB therapy with Coreg. No ACE/ARB given ESRD. On Imdur for afterload reduction. Volume control through HD.    Candee Furbish, MD

## 2015-07-13 NOTE — Progress Notes (Signed)
Reviewed preop ed with pt, reminding him to read booklet (he has) and watch video. Pt sts he would prefer not to watch video. He has been using IS and walking around room. Pts angina has been progressing therefore we will not ambulate in hall any more. Instructed pt on mobility post op. Will sign off. [id number] Yves Dill CES, ACSM 10:27 AM 07/13/2015

## 2015-07-13 NOTE — Progress Notes (Signed)
S: No further CP this am.  Some bleeding from AVF last night as result of being on heparin  O:BP 139/63 mmHg  Pulse 56  Temp(Src) 97.7 F (36.5 C) (Oral)  Resp 17  Ht 5\' 11"  (1.803 m)  Wt 97.16 kg (214 lb 3.2 oz)  BMI 29.89 kg/m2  SpO2 99%  Intake/Output Summary (Last 24 hours) at 07/13/15 0724 Last data filed at 07/12/15 2359  Gross per 24 hour  Intake    995 ml  Output      0 ml  Net    995 ml   Weight change: 0.816 kg (1 lb 12.8 oz) Gen: awake and alert CVS: RRR 1/6 systolic M Resp: clear Abd:+ BS NTND Ext: No edema Lt forearm AVF + bruit NEURO: CNI Ox3 no asterixis   . aspirin EC  81 mg Oral Daily  . calcium acetate  1,334 mg Oral TID WC  . carvedilol  6.25 mg Oral BID WC  . darbepoetin (ARANESP) injection - DIALYSIS  60 mcg Intravenous Q Tue-HD  . insulin aspart  0-15 Units Subcutaneous TID WC  . insulin glargine  20 Units Subcutaneous QHS  . isosorbide mononitrate  90 mg Oral Daily  . magic mouthwash  10 mL Oral TID  . multivitamin  1 tablet Oral QHS  . pregabalin  75 mg Oral BID  . ranolazine  500 mg Oral BID  . rosuvastatin  20 mg Oral QHS  . sodium chloride flush  3 mL Intravenous Q12H  . sodium chloride flush  3 mL Intravenous Q12H   Ir Veno/ext/bi  07/12/2015  CLINICAL DATA:  Previous left saphenous vein harvest. Inconclusive vein mapping. Preop for possible cardiac surgery. EXAM: BILATERAL EXTREMITY VENOGRAPHY; IR ULTRASOUND GUIDANCE VASC ACCESS RIGHT; IR ULTRASOUND GUIDANCE VASC ACCESS LEFT ANESTHESIA/SEDATION: None provided MEDICATIONS: Lidocaine 1% subcutaneous PROCEDURE: The procedure, risks (including but not limited to bleeding, infection, organ damage ), benefits, and alternatives were explained to the patient. Questions regarding the procedure were encouraged and answered. The patient understands and consents to the procedure. Right medial calf prepped with chlorhexidine, draped in usual sterile fashion. Maximal barrier sterile technique was utilized  including caps, mask, sterile gowns, sterile gloves, sterile drape, hand hygiene and skin antiseptic. Patency of the right great saphenous vein just above the ankle was demonstrated and documented with ultrasound. Under real-time ultrasound guidance, the vein was accessed with a 21-gauge micropuncture needle, exchanged over a 018 guidewire for a micro dilator for venography. In similar fashion, left medial calf prepped and draped in usual sterile fashion. Patency of the left great saphenous vein just above the ankle was documented. Under real-time ultrasound guidance, the vein was accessed with a 21-gauge micropuncture needle, exchanged over a 018 guidewire for a dilator for venography. The dilators were removed and hemostasis achieved at the sites. The patient tolerated the procedure well. COMPLICATIONS: None immediate FINDINGS: On the right, patent right great saphenous vein in the distal calf was identified and catheterized. Venography shows patency of a segment of the right great saphenous vein to just above the knee. This segment measures approximately 3 mm diameter. Drains via collaterals into the deep venous system which is unremarkable. On the left, patent left great saphenous vein in the distal calf was identified and catheterized. Venography demonstrates patency of a small distal segment of the great saphenous vein seen to the lower calf. This segment measures approximately 2 mm diameter. Drainage via cutaneous and deep perforator veins into the deep venous system, which is  unremarkable. IMPRESSION: 1. Segmental patency of distal components of the great saphenous veins, measuring 2 mm diameter extending to the lower calf on the left, 3 mm on the right across the knee to the distal thigh. Electronically Signed   By: Lucrezia Europe M.D.   On: 07/12/2015 15:18   Ir US Guide Vasc Access Left  07/12/2015  CLINICAL DATA:  Previous left saphenous vein harvest. Inconclusive vein mapping. Preop for possible cardiac  surgery. EXAM: BILATERAL EXTREMITY VENOGRAPHY; IR ULTRASOUND GUIDANCE VASC ACCESS RIGHT; IR ULTRASOUND GUIDANCE VASC ACCESS LEFT ANESTHESIA/SEDATION: None provided MEDICATIONS: Lidocaine 1% subcutaneous PROCEDURE: The procedure, risks (including but not limited to bleeding, infection, organ damage ), benefits, and alternatives were explained to the patient. Questions regarding the procedure were encouraged and answered. The patient understands and consents to the procedure. Right medial calf prepped with chlorhexidine, draped in usual sterile fashion. Maximal barrier sterile technique was utilized including caps, mask, sterile gowns, sterile gloves, sterile drape, hand hygiene and skin antiseptic. Patency of the right great saphenous vein just above the ankle was demonstrated and documented with ultrasound. Under real-time ultrasound guidance, the vein was accessed with a 21-gauge micropuncture needle, exchanged over a 018 guidewire for a micro dilator for venography. In similar fashion, left medial calf prepped and draped in usual sterile fashion. Patency of the left great saphenous vein just above the ankle was documented. Under real-time ultrasound guidance, the vein was accessed with a 21-gauge micropuncture needle, exchanged over a 018 guidewire for a dilator for venography. The dilators were removed and hemostasis achieved at the sites. The patient tolerated the procedure well. COMPLICATIONS: None immediate FINDINGS: On the right, patent right great saphenous vein in the distal calf was identified and catheterized. Venography shows patency of a segment of the right great saphenous vein to just above the knee. This segment measures approximately 3 mm diameter. Drains via collaterals into the deep venous system which is unremarkable. On the left, patent left great saphenous vein in the distal calf was identified and catheterized. Venography demonstrates patency of a small distal segment of the great saphenous  vein seen to the lower calf. This segment measures approximately 2 mm diameter. Drainage via cutaneous and deep perforator veins into the deep venous system, which is unremarkable. IMPRESSION: 1. Segmental patency of distal components of the great saphenous veins, measuring 2 mm diameter extending to the lower calf on the left, 3 mm on the right across the knee to the distal thigh. Electronically Signed   By: Lucrezia Europe M.D.   On: 07/12/2015 15:18   Ir US Guide Vasc Access Right  07/12/2015  CLINICAL DATA:  Previous left saphenous vein harvest. Inconclusive vein mapping. Preop for possible cardiac surgery. EXAM: BILATERAL EXTREMITY VENOGRAPHY; IR ULTRASOUND GUIDANCE VASC ACCESS RIGHT; IR ULTRASOUND GUIDANCE VASC ACCESS LEFT ANESTHESIA/SEDATION: None provided MEDICATIONS: Lidocaine 1% subcutaneous PROCEDURE: The procedure, risks (including but not limited to bleeding, infection, organ damage ), benefits, and alternatives were explained to the patient. Questions regarding the procedure were encouraged and answered. The patient understands and consents to the procedure. Right medial calf prepped with chlorhexidine, draped in usual sterile fashion. Maximal barrier sterile technique was utilized including caps, mask, sterile gowns, sterile gloves, sterile drape, hand hygiene and skin antiseptic. Patency of the right great saphenous vein just above the ankle was demonstrated and documented with ultrasound. Under real-time ultrasound guidance, the vein was accessed with a 21-gauge micropuncture needle, exchanged over a 018 guidewire for a micro dilator for venography. In  similar fashion, left medial calf prepped and draped in usual sterile fashion. Patency of the left great saphenous vein just above the ankle was documented. Under real-time ultrasound guidance, the vein was accessed with a 21-gauge micropuncture needle, exchanged over a 018 guidewire for a dilator for venography. The dilators were removed and hemostasis  achieved at the sites. The patient tolerated the procedure well. COMPLICATIONS: None immediate FINDINGS: On the right, patent right great saphenous vein in the distal calf was identified and catheterized. Venography shows patency of a segment of the right great saphenous vein to just above the knee. This segment measures approximately 3 mm diameter. Drains via collaterals into the deep venous system which is unremarkable. On the left, patent left great saphenous vein in the distal calf was identified and catheterized. Venography demonstrates patency of a small distal segment of the great saphenous vein seen to the lower calf. This segment measures approximately 2 mm diameter. Drainage via cutaneous and deep perforator veins into the deep venous system, which is unremarkable. IMPRESSION: 1. Segmental patency of distal components of the great saphenous veins, measuring 2 mm diameter extending to the lower calf on the left, 3 mm on the right across the knee to the distal thigh. Electronically Signed   By: Lucrezia Europe M.D.   On: 07/12/2015 15:18   BMET    Component Value Date/Time   NA 135 07/10/2015 1209   NA 143 09/11/2014 1549   NA 143 01/05/2014 1334   NA 139 02/04/2008 1320   K 3.5 07/10/2015 1209   K 5.5* 01/05/2014 1334   K 4.3 02/04/2008 1320   CL 96* 07/10/2015 1209   CL 112* 01/05/2014 1334   CL 102 02/04/2008 1320   CO2 29 07/10/2015 1209   CO2 21 01/05/2014 1334   CO2 28 02/04/2008 1320   GLUCOSE 222* 07/10/2015 1209   GLUCOSE 200* 09/11/2014 1549   GLUCOSE 104* 01/05/2014 1334   GLUCOSE 240* 02/04/2008 1320   BUN 18 07/10/2015 1209   BUN 57* 09/11/2014 1549   BUN 88* 01/05/2014 1334   BUN 18 02/04/2008 1320   CREATININE 3.32* 07/10/2015 1209   CREATININE 3.52* 05/26/2014 1024   CREATININE 1.3* 02/04/2008 1320   CALCIUM 8.1* 07/10/2015 1209   CALCIUM 7.5* 11/06/2014 0421   CALCIUM 6.9* 05/26/2014 1024   CALCIUM 8.8 02/04/2008 1320   GFRNONAA 18* 07/10/2015 1209   GFRNONAA 17*  05/26/2014 1024   GFRNONAA 19* 01/05/2014 1334   GFRAA 21* 07/10/2015 1209   GFRAA 20* 05/26/2014 1024   GFRAA 23* 01/05/2014 1334   CBC    Component Value Date/Time   WBC 5.6 07/12/2015 0430   WBC 4.9 03/18/2015 1539   WBC 4.7 05/26/2014 1024   RBC 3.41* 07/12/2015 0430   RBC 2.91* 03/18/2015 1539   RBC 3.02* 11/06/2014 0421   RBC 3.61* 05/26/2014 1024   RBC 5.35 03/23/2008 1117   HGB 9.9* 07/12/2015 0430   HGB 10.2* 05/26/2014 1024   HGB 15.8 03/23/2008 1117   HCT 31.8* 07/12/2015 0430   HCT 25.8* 03/18/2015 1539   HCT 30.6* 05/26/2014 1024   HCT 46.2 03/23/2008 1117   PLT 58* 07/12/2015 0430   PLT 99* 03/18/2015 1539   PLT 80* 05/26/2014 1024   PLT 93* 03/23/2008 1117   MCV 93.3 07/12/2015 0430   MCV 89 03/18/2015 1539   MCV 85 05/26/2014 1024   MCV 86 03/23/2008 1117   MCH 29.0 07/12/2015 0430   MCH 29.2 03/18/2015 1539  MCH 28.3 05/26/2014 1024   MCH 29.5 03/23/2008 1117   MCHC 31.1 07/12/2015 0430   MCHC 32.9 03/18/2015 1539   MCHC 33.4 05/26/2014 1024   MCHC 34.1 03/23/2008 1117   RDW 14.9 07/12/2015 0430   RDW 16.8* 03/18/2015 1539   RDW 15.7* 05/26/2014 1024   RDW 12.7 03/23/2008 1117   LYMPHSABS 0.7* 05/25/2015 1343   LYMPHSABS 0.6* 09/11/2014 0000   LYMPHSABS 0.7* 05/26/2014 1024   LYMPHSABS 1.3 03/23/2008 1117   MONOABS 0.4 05/25/2015 1343   MONOABS 0.3 05/26/2014 1024   EOSABS 0.0 05/25/2015 1343   EOSABS 0.1 09/11/2014 0000   EOSABS 0.1 05/26/2014 1024   EOSABS 0.1 03/23/2008 1117   BASOSABS 0.0 05/25/2015 1343   BASOSABS 0.0 09/11/2014 0000   BASOSABS 0.0 05/26/2014 1024   BASOSABS 0.1 03/23/2008 1117     Assessment: 1. NSTEMI 2. DM 3. Anemia 4. HTN 5. ESRD  TTS 6. 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 Plan HD today.  Labs pending 2. Await time for surgery based on HIT WU    Aariel Ems T

## 2015-07-13 NOTE — Consult Note (Signed)
Referring MD:   PCP:  Marden Noble, MD   Reason for Referral: Acute on chronic thrombocytopenia, located by the use of antiplatelet agents and heparin   Chief Complaint  Patient presents with  . Chest Pain    HPI:  Joseph [name] 70 year old man with advanced coronary artery disease, hypertension, insulin-dependent diabetes, and end-stage renal disease on dialysis since approximately September 2016. He underwent initial coronary bypass surgery in 2001. He has a chronic ischemic cardiomyopathy with left ventricular ejection fraction 25-30 percent and additional diastolic dysfunction. He underwent catheterization in July 2016 to evaluate chest pain. He was found to have critical stenosis of his left main coronary artery and a drug eluting stent was placed from the left main into the circumflex. He was reevaluated again in January 2017 when he presented with chest pain and ST wave elevation on EKGs. He was found to have a moderate in-stent restenosis and significant elevation of left ventricular end-diastolic pressures. He was admitted most recently on 06/16/2015 to evaluate left-sided chest pain. Cardiac cath on May 31 now shows severe in-stent rethrombosis to the distal left main and ostial circumflex arteries and occlusion of all prior vascular grafts. Although it is possible to place another stent, it is anticipated that this stent will also thrombose within 6 months of placement. He is therefore being considered for a repeat coronary bypass procedure.  He has had chronic, moderate, thrombocytopenia, dating back at least to December 2009 when platelets were 81,000. Over the 8 years since that time, platelet count has fluctuated widely but is usually in the 60-90000 range. Platelet count on 08/26/2013 was 58,000. Highest platelet count ever recorded was on 05/25/2015 at 122,000. Count was 86,000 on admission 06/16/2015 but has gradually fallen down to 56,000 today June 6. He has had multiple  exposures to unfractionated heparin including at time of recent May 29 cardiac catheterization and is currently on unfractionated heparin infusion. He is on Rondec aspirin and Brilinta antiplatelet agents. Brilinta was started in October 2016 when he experienced an episode of chest pain while on a fishing trip in [place]. Brilinta was discontinued during this admission in anticipation of possible open-heart surgery. A  P2Y12 inhibitor platelet function study was done today and shows that the Brilinta effect has resolved. An  ELISA assay for the presence of antibodies to platelet factor 4 is negative.   He has had an evaluation for chronic thrombocytopenia in the past. Although nonspecific, antiplatelet antibodies were not detected.   The patient has an identical twin brother who was evaluated for thrombocytopenia and found to have a myelodysplastic syndrome and subsequently developed acute myeloid leukemia requiring aggressive treatment but unfortunately had an early relapse and died. He has 2 daughters. His 49 year old daughter has thrombocytopenia. A 18 year old daughter to his knowledge does not. He underwent a bone marrow aspiration and biopsy on 11/06/2012. Marrow was mildly hypercellular for age 55-60 percent. No increase in blasts. Slight increase in megakaryocytes. Normal maturation. There was a population of plasma cells 5-10 percent with kappa light chain predominance. (Despite pathologist estimates 5-10% plasma cells, plasma cells only accounted for 4% of cells on a 300 count differential). Routine cytogenetics were normal. No increase in blasts on flow cytometry. Platelet count was 80,000 at that time. 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  He underwent a liver biopsy at [facility] on 07/04/2013 to rule out amyloidosis. He was found  to have early bridging fibrosis and increased iron stores  but amyloid stains on the liver and on a rectal biopsy were negative. He tested negative for hepatitis C and HIV. ANA negative. Hepatitis B surface antigen negative. No evidence for a hemolytic process with LDH 220 and haptoglobin 155. CT scan of the abdomen and pelvis on 09/22/2013 showed borderline splenomegaly with spleen 14 cm. A previous CT scan of the abdomen 02/11/2013 showed liver 22 cm and spleen 16 cm.        Past Medical History:   Diagnosis Date  . Hypertensive heart disease   . Hypercholesterolemia   . Prostate cancer ([place])   . Hepatosplenomegaly 2015    fibrosis, no cirrhosis on 06/2013 liver biopsy  . Thrombocytopenia ([place]) 2015  . Depression with anxiety   . IDDM (insulin dependent diabetes mellitus) ([place])     INSULIN DEPENDENT  . GERD (gastroesophageal reflux disease)   . History of shingles   . Ischemic cardiomyopathy     a. echo 08/06/2014: EF 25-30%, multiple WMA, mod DD, PASP 49 mm Hg;  b. 12/2014 Echo: EF 30-35%, diff HK, mild MR.; Echo 03/2015 - ? LV thrombus (not seen in 3/27 - but EF ? 40-45%.   . CKD (chronic kidney disease), stage IV ([place])     a. previously on dialysis 11/2013->04/2014;  b. 12/2014 Dialysis resumed.  . Coronary artery disease, occlusive     a. 2001 MI s/p 4v CABG (LIMA-LAD, SVG-D2, SVG-OM1, SVG-right PDA, SVG-right PL1); b. cath 07/30/2014 vein grafts all down, patent LIMA to LAD, significant LM and ost LCx dx; c. 08/07/2014 PCI Synergy DES 2.5 mm x 18 mm to dist LM and ost LCx, rec lifelong DAPT.; 02/2015 - Patent LIMA-LAD with L-R Collaterals, 60% ISR of LM-Cx stent (non-occlusive)  . Chronic combined systolic and diastolic CHF, NYHA class 2 ([place])     a. 08/2013 Echo: EF 40-45%, impaired relaxation;  b. 07/2014 Echo: EF 25-30%;  c. 12/2014 Echo: EF 30-35%, diff HK, mild MR.  . Gallstone pancreatitis 2015  . ESRD (end stage renal disease) [facility])     Past Surgical History  Procedure Laterality Date  . Cardiac surgery    . Shoulder arthroscopy     . Penile prosthesis implant    . Coronary angioplasty    . Coronary artery bypass graft  2001    LIMA-LAD, SVG-D2, SVG-OM 1, SVG-r PDA, SVG-r PL 1) --> as of January 2017, all grafts besides LIMA occluded.  . Colonoscopy    . Liver biopsy    . Av fistula placement Left 2015  . Cardiac catheterization N/A 07/30/2014    Procedure: Left Heart Cath and Cors/Grafts Angiography;  Surgeon: Dionisio David, MD;  Location: [place] [facility];  Service: Cardiovascular;  Laterality: N/A;  . Cardiac catheterization N/A 08/07/2014    Procedure: Coronary Stent Intervention;  Surgeon: Lorretta Harp, MD;  Location: [place] [facility];  Service: Cardiovascular;  Left Main-Ostial Circumflex PCI with Synergy DES 2.5 mm x 18 mm  . Cardiac catheterization N/A 03/09/2015    Procedure: Left Heart Cath and Coronary Angiography;  Surgeon: Sherren Mocha, MD;  Location: [place] [facility];  Service: Cardiovascular: 100% oLAD & pRCA, ~60% ISR in oCx. Patent LIMA-LAD with L-R collaterals to rPDA  . Transthoracic echocardiogram  07/2014-04/2015    a. echo 08/06/2014: EF 25-30%, multiple WMA, mod DD, PASP 49 mm Hg;  b. 12/2014 Echo: EF 30-35%, diff HK, mild MR.; Echo 03/2015 - ? LV  thrombus (not seen in 3/27 - but EF ? 40-45%.   . Cardiac catheterization N/A 06/22/2015    Procedure: Left Heart Cath and Cors/Grafts Angiography;  Surgeon: Belva Crome, MD;  Location: [place] [facility];  Service: Cardiovascular;  Laterality: N/A;  :  . aspirin EC  81 mg Oral Daily  . calcium acetate  1,334 mg Oral TID WC  . carvedilol  6.25 mg Oral BID WC  . darbepoetin (ARANESP) injection - DIALYSIS  60 mcg Intravenous Q Tue-HD  . insulin aspart  0-15 Units Subcutaneous TID WC  . insulin glargine  20 Units Subcutaneous QHS  . isosorbide mononitrate  90 mg Oral Daily  . magic mouthwash  10 mL Oral TID  . multivitamin  1 tablet Oral QHS  . pregabalin  75 mg Oral BID  . ranolazine  500 mg Oral BID  . rosuvastatin  20 mg Oral QHS  .  sodium chloride flush  3 mL Intravenous Q12H  . sodium chloride flush  3 mL Intravenous Q12H  :  Allergies  Allergen Reactions  . Sulfa Antibiotics Hives and Itching  . Morphine And Related     Makes patient feel weird  :  Family History  Problem Relation Age of Onset  . Acute myelogenous leukemia Brother   . CAD Brother   . Diabetes Mellitus II Brother   . Heart disease Father 85    CABG  :  Social History   Social History  . Marital Status: Legally Separated    Spouse Name: N/A  . Number of Children: N/A  . Years of Education: N/A   Occupational History  . Not on file.   Social History Main Topics  . Smoking status: Former Smoker    Quit date: 02/07/2008  . Smokeless tobacco: Former Systems developer  . Alcohol Use: No   Confirmed with patient today   . Drug Use: No  . Sexual Activity: Not on file   Other Topics Concern  . Not on file   Social History Narrative  :  ROS: Eyes:No change in vision Throat:  Neck: Resp:  No cough or dyspnea Cardio: See history of present illness GI:No abdominal pain or change in bowel habit Extremities: No leg swelling Lymph nodes: No swollen glands Neurologic: No headache or change in vision . Chronic paresthesias of his feet on Lyrica. Skin: .No easy bruising Genitourinary: Penile prosthesis   Hematology: He denied any epistaxis, gum bleeding, excessive bleeding after dental extractions, excessive bleeding after multiple prior surgeries, hematuria, hematochezia or melena. Vitals: Filed Vitals:   07/13/15 1530 07/13/15 1600  BP: 150/66 152/65  Pulse: 56 58  Temp:    Resp: 17 21    PHYSICAL EXAM: General appearance:Friendly, Well-nourished Caucasian man HEENT: Pharynx no erythema exudate or ulcer. No mass. Neck supple. Lymph Nodes: No cervical, supraclavicular, or axillary adenopathy Resp: Lungs overall clear to auscultation and resonant to percussion throughout Cardio: Regular cardiac rhythm. No murmur gallop or rub Vascular:  Carotids 2+ no bruits. Dorsalis pedis pulses nonpalpable Breasts: GI: Abdomen is soft and nontender. I did not appreciate any organomegaly. No mass GU: Not examined Extremities: No edema, no calf tenderness. No cyanosis. No clubbing. Neurologic: Alert and oriented 3, good memory, cranial nerves II through XII grossly normal, motor strength 5 over 5 all extremities, reflexes absent but symmetric at the knees, 1+ symmetric at the biceps Skin: No rash. Only ecchymosis is at site of subcutaneous Lovenox injections on the abdominal wall. He did have  some oozing at recent vascular graft site left forearm. Wound now covered with gauze which is dry.  Labs:   Recent Labs  07/12/15 0430 07/13/15 0650  WBC 5.6 4.9  HGB 9.9* 9.3*  HCT 31.8* 29.0*  PLT 58* 56*    Recent Labs  07/13/15 0650  NA 136  K 4.3  CL 103  CO2 23  GLUCOSE 113*  BUN 61*  CREATININE 6.63*  CALCIUM 8.5*    Pathology: See discussion above  Review of the peripheral blood film: Normochromic normocytic red cells. Mature neutrophils. Platelets decreased average 5 per high-power field, estimate 75,000/mcL. Normal morphology.    Assessment: Principal Problem:   NSTEMI (non-ST elevated myocardial infarction) [facility]) Active Problems:   Type 2 diabetes mellitus with renal manifestations ([place])   Ischemic cardiomyopathy   CAD s/p CABG 2001, prior POBA, PCI/DES July 2016   Chronic combined systolic and diastolic heart failure ([place])   Hypertensive heart disease with heart failure (H[facility])   ESRD (end stage renal disease) ([place])   Dyslipidemia, goal LDL below 70   Pain in the chest  Impression: Quantitative and qualitative moderate thrombocytopenia  He has chronic, fluctuating, thrombocytopenia in the range of 60-90,000 documented for at least the last 8 years. Negative exhaustive evaluation summarized above. Presence of splenomegaly suggests an element of sequestration is responsible for his thrombocytopenia. Low  likelihood that this is immune related thrombocytopenia especially given stability over time. Platelet count drifted down during current admission but value still within range of his previous. Test for heparin associated antibody negative. Complicating factors include the use of antiplatelet agents and the uremic platelet defect associated with end-stage renal disease which is not corrected by dialysis and not related to the elevation of BUN.  Recommendation: No contraindication to continuing heparin with ongoing monitoring of his blood counts. I would transfuse platelets intraoperatively and perioperatively to maintain count over 100,000  if possible for the first 24-48 hours. Consider a preop dose of DDAVP 0.3 mcg/kg IV and 50 mL normal saline over 20 minutes to override any uremic related platelet defect.  If excessive oozing Intra-Op, administer cryoprecipitate, 10 units. I do not see a role for steroids or intravenous immunoglobulin in this man.    Murriel Hopper, MD, [place]  Hematology-Oncology/Internal Medicine [phone] 07/13/2015, 5:17 PM

## 2015-07-13 NOTE — Consult Note (Signed)
   [facility] Inpatient Consult   07/13/2015  Joseph [name] [birth year] [id number]   Patient screened for [place] Management program. [name] is currently off the unit. Will come back at later time to discuss and offer [place] Management services if appropriate. Inpatient RNCM aware.  Marthenia Rolling, MSN-Ed, RN,BSN [facility] Liaison [phone]

## 2015-07-13 NOTE — Progress Notes (Signed)
6 Days Post-Op Procedure(s) (LRB): Left Heart Cath and Cors/Grafts Angiography (N/A) Subjective: Patient with anxiety-induced angina last p.m. Platelet count continues to drop after heparin started -the patient has had clinical bleeding from needle puncture in his left arm AV graft  I spoke with Dr. Beryle Beams to evaluate his thrombocytopenia. HIT is pending. P2 Y. 12 assay shows improvement in platelet inhibition after stopping brillinta.  Objective: Vital signs in last 24 hours: Temp:  [96.8 F (36 C)-98 F (36.7 C)] 96.8 F (36 C) (06/06 1415) Pulse Rate:  [56-62] 56 (06/06 1530) Cardiac Rhythm:  [-] Normal sinus rhythm;Bundle branch block (06/06 0800) Resp:  [10-21] 17 (06/06 1530) BP: (135-189)/(48-85) 150/66 mmHg (06/06 1530) SpO2:  [96 %-99 %] 98 % (06/06 1415) Weight:  [214 lb 3.2 oz (97.16 kg)-215 lb 6.2 oz (97.7 kg)] 215 lb 6.2 oz (97.7 kg) (06/06 1415)  Hemodynamic parameters for last 24 hours:  stable  Intake/Output from previous day: 06/05 0701 - 06/06 0700 In: 995 [P.O.:720; I.V.:275] Out: -  Intake/Output this shift: Total I/O In: 240 [P.O.:240] Out: -        Exam    General- alert and comfortable   Lungs- clear without rales, wheezes   Cor- regular rate and rhythm, no murmur , gallop   Abdomen- soft, non-tender   Extremities - warm, non-tender, minimal edema   Neuro- oriented, appropriate, no focal weakness   Lab Results:  Recent Labs  07/12/15 0430 07/13/15 0650  WBC 5.6 4.9  HGB 9.9* 9.3*  HCT 31.8* 29.0*  PLT 58* 56*   BMET:  Recent Labs  07/13/15 0650  NA 136  K 4.3  CL 103  CO2 23  GLUCOSE 113*  BUN 61*  CREATININE 6.63*  CALCIUM 8.5*    PT/INR: No results for input(s): LABPROT, INR in the last 72 hours. ABG    Component Value Date/Time   TCO2 22 03/09/2015 2330   CBG (last 3)   Recent Labs  07/12/15 2132 07/13/15 0730 07/13/15 1136  GLUCAP 197* 114* 164*    Assessment/Plan: S/P Procedure(s) (LRB): Left  Heart Cath and Cors/Grafts Angiography (N/A) High-risk redo CABG pending after evaluating thrombocytopenia, ruling out HIT   LOS: 7 days    Tharon Aquas Trigt III 07/13/2015

## 2015-07-14 ENCOUNTER — Inpatient Hospital Stay (HOSPITAL_COMMUNITY): Payer: Medicare Other

## 2015-07-14 DIAGNOSIS — D638 Anemia in other chronic diseases classified elsewhere: Secondary | ICD-10-CM

## 2015-07-14 LAB — SURGICAL PCR SCREEN
MRSA, PCR: NEGATIVE
Staphylococcus aureus: NEGATIVE

## 2015-07-14 LAB — GLUCOSE, CAPILLARY
Glucose-Capillary: 117 mg/dL — ABNORMAL HIGH (ref 65–99)
Glucose-Capillary: 155 mg/dL — ABNORMAL HIGH (ref 65–99)
Glucose-Capillary: 218 mg/dL — ABNORMAL HIGH (ref 65–99)

## 2015-07-14 LAB — BLOOD GAS, ARTERIAL
Acid-Base Excess: 3.9 mmol/L — ABNORMAL HIGH (ref 0.0–2.0)
Bicarbonate: 27.6 meq/L — ABNORMAL HIGH (ref 20.0–24.0)
Drawn by: 312971
FIO2: 0.21
O2 Saturation: 93.5 %
Patient temperature: 98.6
TCO2: 28.8 mmol/L (ref 0–100)
pCO2 arterial: 39.5 mmHg (ref 35.0–45.0)
pH, Arterial: 7.458 — ABNORMAL HIGH (ref 7.350–7.450)
pO2, Arterial: 71.6 mmHg — ABNORMAL LOW (ref 80.0–100.0)

## 2015-07-14 LAB — CBC
HCT: 31 % — ABNORMAL LOW (ref 39.0–52.0)
Hemoglobin: 9.8 g/dL — ABNORMAL LOW (ref 13.0–17.0)
MCH: 29.4 pg (ref 26.0–34.0)
MCHC: 31.6 g/dL (ref 30.0–36.0)
MCV: 93.1 fL (ref 78.0–100.0)
PLATELETS: 66 10*3/uL — AB (ref 150–400)
RBC: 3.33 MIL/uL — ABNORMAL LOW (ref 4.22–5.81)
RDW: 14.9 % (ref 11.5–15.5)
WBC: 5 10*3/uL (ref 4.0–10.5)

## 2015-07-14 LAB — HEPATIC FUNCTION PANEL
ALK PHOS: 74 U/L (ref 38–126)
ALT: 26 U/L (ref 17–63)
AST: 28 U/L (ref 15–41)
Albumin: 3.7 g/dL (ref 3.5–5.0)
BILIRUBIN DIRECT: 0.2 mg/dL (ref 0.1–0.5)
BILIRUBIN INDIRECT: 0.7 mg/dL (ref 0.3–0.9)
Total Bilirubin: 0.9 mg/dL (ref 0.3–1.2)
Total Protein: 7.7 g/dL (ref 6.5–8.1)

## 2015-07-14 LAB — PREPARE RBC (CROSSMATCH)

## 2015-07-14 LAB — HEPARIN LEVEL (UNFRACTIONATED): HEPARIN UNFRACTIONATED: 0.55 [IU]/mL (ref 0.30–0.70)

## 2015-07-14 MED ORDER — SODIUM CHLORIDE 0.9 % IV SOLN
INTRAVENOUS | Status: DC
  Filled 2015-07-14: qty 30

## 2015-07-14 MED ORDER — DEXMEDETOMIDINE HCL IN NACL 400 MCG/100ML IV SOLN
0.1000 ug/kg/h | INTRAVENOUS | Status: DC
  Filled 2015-07-14: qty 100

## 2015-07-14 MED ORDER — PLASMA-LYTE 148 IV SOLN
INTRAVENOUS | Status: DC
  Filled 2015-07-14: qty 2.5

## 2015-07-14 MED ORDER — SODIUM CHLORIDE 0.9 % IV SOLN
0.3000 ug/kg | Freq: Once | INTRAVENOUS | Status: AC
  Administered 2015-07-14: 28 ug via INTRAVENOUS
  Filled 2015-07-14 (×2): qty 7

## 2015-07-14 MED ORDER — DOPAMINE-DEXTROSE 3.2-5 MG/ML-% IV SOLN
0.0000 ug/kg/min | INTRAVENOUS | Status: DC
  Filled 2015-07-14: qty 250

## 2015-07-14 MED ORDER — MAGNESIUM SULFATE 50 % IJ SOLN
40.0000 meq | INTRAMUSCULAR | Status: DC
  Filled 2015-07-14: qty 10

## 2015-07-14 MED ORDER — SODIUM CHLORIDE 0.9 % IV SOLN
INTRAVENOUS | Status: AC
  Administered 2015-07-15: 1.5 [IU]/h via INTRAVENOUS
  Filled 2015-07-14: qty 2.5

## 2015-07-14 MED ORDER — DEXTROSE 5 % IV SOLN
0.0000 ug/min | INTRAVENOUS | Status: AC
  Administered 2015-07-15: 1 ug/min via INTRAVENOUS
  Filled 2015-07-14: qty 4

## 2015-07-14 MED ORDER — SODIUM CHLORIDE 0.9 % IV SOLN
INTRAVENOUS | Status: AC
  Administered 2015-07-15: 69 mL/h via INTRAVENOUS
  Filled 2015-07-14: qty 40

## 2015-07-14 MED ORDER — CHLORHEXIDINE GLUCONATE 4 % EX LIQD
60.0000 mL | Freq: Once | CUTANEOUS | Status: AC
  Administered 2015-07-14: 4 via TOPICAL
  Filled 2015-07-14: qty 60

## 2015-07-14 MED ORDER — NITROGLYCERIN IN D5W 200-5 MCG/ML-% IV SOLN
2.0000 ug/min | INTRAVENOUS | Status: AC
  Administered 2015-07-14: 10 ug/min via INTRAVENOUS
  Filled 2015-07-14 (×2): qty 250

## 2015-07-14 MED ORDER — POTASSIUM CHLORIDE 2 MEQ/ML IV SOLN
80.0000 meq | INTRAVENOUS | Status: DC
  Filled 2015-07-14: qty 40

## 2015-07-14 MED ORDER — SODIUM CHLORIDE 0.9 % IV SOLN
INTRAVENOUS | Status: DC
  Filled 2015-07-14: qty 2.5

## 2015-07-14 MED ORDER — VANCOMYCIN HCL 10 G IV SOLR
1250.0000 mg | INTRAVENOUS | Status: DC
  Filled 2015-07-14: qty 1250

## 2015-07-14 MED ORDER — METOPROLOL TARTRATE 12.5 MG HALF TABLET
12.5000 mg | ORAL_TABLET | Freq: Once | ORAL | Status: AC
  Administered 2015-07-15: 12.5 mg via ORAL
  Filled 2015-07-14 (×2): qty 1

## 2015-07-14 MED ORDER — DEXTROSE 5 % IV SOLN
0.0000 ug/min | INTRAVENOUS | Status: DC
  Filled 2015-07-14: qty 4

## 2015-07-14 MED ORDER — CHLORHEXIDINE GLUCONATE 0.12 % MT SOLN
15.0000 mL | Freq: Once | OROMUCOSAL | Status: AC
  Administered 2015-07-15: 15 mL via OROMUCOSAL
  Filled 2015-07-14: qty 15

## 2015-07-14 MED ORDER — CHLORHEXIDINE GLUCONATE 4 % EX LIQD
60.0000 mL | Freq: Once | CUTANEOUS | Status: AC
  Administered 2015-07-15: 4 via TOPICAL
  Filled 2015-07-14: qty 60

## 2015-07-14 MED ORDER — METOPROLOL TARTRATE 5 MG/5ML IV SOLN
5.0000 mg | Freq: Once | INTRAVENOUS | Status: AC
  Administered 2015-07-14: 5 mg via INTRAVENOUS
  Filled 2015-07-14: qty 5

## 2015-07-14 MED ORDER — BISACODYL 5 MG PO TBEC
5.0000 mg | DELAYED_RELEASE_TABLET | Freq: Once | ORAL | Status: AC
  Administered 2015-07-14: 5 mg via ORAL
  Filled 2015-07-14: qty 1

## 2015-07-14 MED ORDER — DEXTROSE 5 % IV SOLN
30.0000 ug/min | INTRAVENOUS | Status: DC
  Filled 2015-07-14: qty 2

## 2015-07-14 MED ORDER — NITROGLYCERIN IN D5W 200-5 MCG/ML-% IV SOLN: 2.0000 ug/min | INTRAVENOUS | Status: DC

## 2015-07-14 MED ORDER — ALPRAZOLAM 0.25 MG PO TABS
0.2500 mg | ORAL_TABLET | ORAL | Status: DC | PRN
Start: 2015-07-14 — End: 2015-07-15
  Administered 2015-07-14: 0.5 mg via ORAL
  Filled 2015-07-14: qty 2

## 2015-07-14 MED ORDER — DEXTROSE 5 % IV SOLN
1.5000 g | INTRAVENOUS | Status: DC
  Filled 2015-07-14: qty 1.5

## 2015-07-14 MED ORDER — CEFUROXIME SODIUM 1.5 G IJ SOLR
1.5000 g | INTRAMUSCULAR | Status: AC
  Administered 2015-07-15 (×2): .75 g via INTRAVENOUS
  Administered 2015-07-15: 1.5 g via INTRAVENOUS
  Filled 2015-07-14: qty 1.5

## 2015-07-14 MED ORDER — DIAZEPAM 5 MG PO TABS
5.0000 mg | ORAL_TABLET | Freq: Once | ORAL | Status: AC
  Administered 2015-07-15: 5 mg via ORAL
  Filled 2015-07-14: qty 1

## 2015-07-14 MED ORDER — DEXTROSE 5 % IV SOLN
750.0000 mg | INTRAVENOUS | Status: DC
  Filled 2015-07-14: qty 750

## 2015-07-14 MED ORDER — SODIUM CHLORIDE 0.9 % IV SOLN
INTRAVENOUS | Status: DC
  Filled 2015-07-14: qty 40

## 2015-07-14 MED ORDER — NITROGLYCERIN IN D5W 200-5 MCG/ML-% IV SOLN: 0.0000 ug/min | INTRAVENOUS | Status: DC

## 2015-07-14 MED ORDER — FENTANYL CITRATE (PF) 100 MCG/2ML IJ SOLN
INTRAMUSCULAR | Status: AC
  Administered 2015-07-14: 50 ug via INTRAVENOUS
  Filled 2015-07-14: qty 2

## 2015-07-14 MED ORDER — FENTANYL CITRATE (PF) 100 MCG/2ML IJ SOLN
50.0000 ug | Freq: Once | INTRAMUSCULAR | Status: AC
  Administered 2015-07-14: 50 ug via INTRAVENOUS

## 2015-07-14 MED ORDER — VANCOMYCIN HCL 10 G IV SOLR
1500.0000 mg | INTRAVENOUS | Status: AC
  Administered 2015-07-15: 1500 mg via INTRAVENOUS
  Filled 2015-07-14: qty 1500

## 2015-07-14 MED ORDER — PLASMA-LYTE 148 IV SOLN
INTRAVENOUS | Status: AC
  Administered 2015-07-15: 10:00:00
  Filled 2015-07-14: qty 2.5

## 2015-07-14 MED ORDER — CEFUROXIME SODIUM 750 MG IJ SOLR
750.0000 mg | INTRAMUSCULAR | Status: DC
Start: 2015-07-15 — End: 2015-07-15
  Filled 2015-07-14: qty 750

## 2015-07-14 MED ORDER — TEMAZEPAM 15 MG PO CAPS: 15.0000 mg | ORAL_CAPSULE | Freq: Once | ORAL | Status: DC | PRN

## 2015-07-14 MED ORDER — DOPAMINE-DEXTROSE 3.2-5 MG/ML-% IV SOLN: 0.0000 ug/kg/min | INTRAVENOUS | Status: DC

## 2015-07-14 MED ORDER — FENTANYL CITRATE (PF) 100 MCG/2ML IJ SOLN
25.0000 ug | INTRAMUSCULAR | Status: AC | PRN
  Administered 2015-07-14 (×2): 25 ug via INTRAVENOUS
  Filled 2015-07-14: qty 2

## 2015-07-14 MED ORDER — DEXMEDETOMIDINE HCL IN NACL 400 MCG/100ML IV SOLN
0.1000 ug/kg/h | INTRAVENOUS | Status: AC
  Administered 2015-07-15: .2 ug/kg/h via INTRAVENOUS
  Filled 2015-07-14: qty 100

## 2015-07-14 NOTE — Care Management Note (Signed)
Case Management Note  Patient Details  Name: ARMANIE DOSSETT MRN: [id number] Date of Birth: 04-Feb-1946  Subjective/Objective:   Pt admitted for Nstemi- post cardiac cath revealed in-stent restenosis. Plan will be for Re do CABG 08/01/2015.                  Action/Plan: CM will continue to monitor for disposition needs.    Expected Discharge Date:                  Expected Discharge Plan:  [place]  In-House Referral:     Discharge planning Services  CM Consult  Post Acute Care Choice:    Choice offered to:     DME Arranged:    DME Agency:     HH Arranged:    [place] Agency:     Status of Service:  In process, will continue to follow  Medicare Important Message Given:  Yes Date Medicare IM Given:    Medicare IM give by:    Date Additional Medicare IM Given:    Additional Medicare Important Message give by:     If discussed at [place] of Stay Meetings, dates discussed:    Additional Comments:  Bethena Roys, RN 07/14/2015, 3:23 PM

## 2015-07-14 NOTE — Significant Event (Addendum)
Rapid Response Event Note  Overview: Time Called: 2216 Arrival Time: 2216 Event Type: Cardiac  Initial Focused Assessment: Asked to see patient by charge during rounds.  Upon arrival, patient appeared in distress, clenching his chest, patient's stated his chest pain was 10/10, it was radiating down his left arm and in his jaw. Bedside RN did initiate nitroglycerin drip at 41mcg prior to RR RN arrival. VS: SBP in the 170s, MAPs in the 100s, HR in the 90s, patient was on 4L Nasal Cannula. Patient's bedside RN did page Dr. Aundra Dubin for further orders.   Interventions: - Patient nitroglycerin was titrated up to 75mcg per patient's chest pain level and then increased to 157mcg per Dr. Aundra Dubin - Oxygen was administered 15L via NRB - Administered a total of 168mcg of Fentanyl IV, 42mcg x 2 and then 37mcg x 1 - ECG was obtained - Administered 5mg  IV Metoprolol - STAT Troponin - Patient was chest pain free 2310 after interventions, nitroglycerin was slowly weaned down to 78mcg, patient's BP were in   the 90s-100s.  Weaned O2 to 2L [place].     Event Summary: Name of Physician Notified: Dr. Aundra Dubin at      at  2216  Outcome: Stayed in room and stabalized     Joseph [name], [name]

## 2015-07-14 NOTE — Progress Notes (Signed)
Patient Name: Joseph [name] Date of Encounter: 07/14/2015  Hospital Problem List     Principal Problem:   NSTEMI (non-ST elevated myocardial infarction) [facility]) Active Problems:   Ischemic cardiomyopathy   CAD s/p CABG 2001, prior POBA, PCI/DES July 2016   Chronic combined systolic and diastolic heart failure (HCC)   Type 2 diabetes mellitus with renal manifestations (HCC)   Thrombocytopenia (HCC)   Hypertensive heart disease with heart failure (HCC)   ESRD (end stage renal disease) (HCC)   Dyslipidemia, goal LDL below 70   Qualitative platelet defects (HCC)   Hepatosplenomegaly   Anemia of chronic disease    Subjective   No chest pain or sob.  Tentatively scheduled for CABG in the AM.  HD later today.  Inpatient Medications    . calcium acetate  1,334 mg Oral TID WC  . carvedilol  6.25 mg Oral BID WC  . darbepoetin (ARANESP) injection - DIALYSIS  60 mcg Intravenous Q Tue-HD  . desmopressin (DDAVP) IV  0.3 mcg/kg Intravenous Once  . insulin aspart  0-15 Units Subcutaneous TID WC  . insulin glargine  20 Units Subcutaneous QHS  . isosorbide mononitrate  90 mg Oral Daily  . magic mouthwash  10 mL Oral TID  . multivitamin  1 tablet Oral QHS  . pregabalin  75 mg Oral BID  . ranolazine  500 mg Oral BID  . rosuvastatin  20 mg Oral QHS  . sodium chloride flush  3 mL Intravenous Q12H  . sodium chloride flush  3 mL Intravenous Q12H    Vital Signs    Filed Vitals:   07/13/15 1815 07/13/15 1820 07/13/15 1956 07/14/15 0626  BP: 156/85 140/76 146/78 115/44  Pulse: 58 59 58 57  Temp:  97.9 F (36.6 C) 98.2 F (36.8 C) 98.5 F (36.9 C)  TempSrc:  Oral Oral Oral  Resp: 19 18  16   Height:      Weight:  208 lb 12.4 oz (94.7 kg)  206 lb 11.2 oz (93.759 kg)  SpO2:   97% 98%    Intake/Output Summary (Last 24 hours) at 07/14/15 1012 Last data filed at 07/14/15 0559  Gross per 24 hour  Intake    510 ml  Output   3000 ml  Net  -2490 ml   Filed Weights   07/13/15 1415  07/13/15 1820 07/14/15 0626  Weight: 215 lb 6.2 oz (97.7 kg) 208 lb 12.4 oz (94.7 kg) 206 lb 11.2 oz (93.759 kg)    Physical Exam    General: Pleasant, NAD. Neuro: Alert and oriented X 3. Moves all extremities spontaneously. Psych: Normal affect. HEENT:  Normal  Neck: Supple without bruits or JVD. Lungs:  Resp regular and unlabored, CTA. Heart: RRR no s3, s4, 2/6 SEM RUSB. Abdomen: Soft, non-tender, non-distended, BS + x 4.  Extremities: No clubbing, cyanosis or edema. DP/PT/Radials 2+ and equal bilaterally.  Labs    CBC  Recent Labs  07/13/15 0650 07/14/15 0401  WBC 4.9 5.0  HGB 9.3* 9.8*  HCT 29.0* 31.0*  MCV 92.4 93.1  PLT 56* 66*   Basic Metabolic Panel  Recent Labs  07/13/15 0650  NA 136  K 4.3  CL 103  CO2 23  GLUCOSE 113*  BUN 61*  CREATININE 6.63*  CALCIUM 8.5*  PHOS 5.6*   Liver Function Tests  Recent Labs  07/13/15 0650  ALBUMIN 3.4*   Cardiac Enzymes  Recent Labs  07/12/15 2151 07/13/15 0040 07/13/15 0650  TROPONINI 0.06* 0.07*  0.08*    Telemetry    RSR  Radiology    Dg Chest 2 View  07/14/2015  CLINICAL DATA:  Preoperative assessment for CABG, history coronary disease post prior CABG, former smoker, hypertensive heart disease, prostate cancer, insulin-dependent diabetes mellitus, ischemic cardiomyopathy, chronic combined systolic and diastolic CHF, end-stage renal disease, former smoker EXAM: CHEST  2 VIEW COMPARISON:  06/23/2015 FINDINGS: Enlargement of cardiac silhouette post CABG. Mediastinal contours and pulmonary vascularity normal. Lungs clear. No pulmonary infiltrate, pleural effusion or pneumothorax. Degenerative disc disease changes thoracic spine without acute bony abnormalities. IMPRESSION: Enlargement of cardiac silhouette post CABG. No acute abnormalities. Electronically Signed   By: Lavonia Dana M.D.   On: 07/14/2015 10:00   Ir US Guide Vasc Access Right  07/12/2015  CLINICAL DATA:  Previous left saphenous vein harvest.  Inconclusive vein mapping. Preop for possible cardiac surgery. EXAM: BILATERAL EXTREMITY VENOGRAPHY; IR ULTRASOUND GUIDANCE VASC ACCESS RIGHT; IR ULTRASOUND GUIDANCE VASC ACCESSFINDINGS: On the right, patent right great saphenous vein in the distal calf was identified and catheterized. Venography shows patency of a segment of the right great saphenous vein to just above the knee. This segment measures approximately 3 mm diameter. Drains via collaterals into the deep venous system which is unremarkable. On the left, patent left great saphenous vein in the distal calf was identified and catheterized. Venography demonstrates patency of a small distal segment of the great saphenous vein seen to the lower calf. This segment measures approximately 2 mm diameter. Drainage via cutaneous and deep perforator veins into the deep venous system, which is unremarkable. IMPRESSION: 1. Segmental patency of distal components of the great saphenous veins, measuring 2 mm diameter extending to the lower calf on the left, 3 mm on the right across the knee to the distal thigh. Electronically Signed   By: Lucrezia Europe M.D.   On: 07/12/2015 15:18    Assessment & Plan    70 year old male with a past medical history of CAD, status post CABG, ischemic cardiomyopathy and end-stage renal disease. Patient had his last PCI in July 2016. He was admitted in January 2017 and underwent cath which revealed moderate in-stent restenosis of his Lcx and elevated LVEDP, treated medically. He has been readmitted for recurrent CP. LHC 5/31 showed new severe ISR within the distal left main and ostial circumflex. Considered for high risk re-do CABG. Has limited conduits available, however RIMA and right saphenous vein seems viable. For CABG in AM 6/8.  1.  CAD:  Admitted with recurrent nitrate responsive chest pain with mild troponin elevation.  Cath with severe native and graft dzs.  Patent LIMA  LAD.  No c/p overnight.  Scheduled for surgery in AM with  Dr. Prescott Gum.  Cont  blocker, statin, nitrate, and ranexa.  Previously on brilinta.  This has been held since 6/1.  2.  ESRD:  Appreciate nephrology assistance.  For HD today.  3.  Type II DM:  Glucoses stable on current insulin dose.  4.  Thrombocytopenia:  Appreciate hematology assistance.  Plts stable.  Plan for DDAVP and platelet infusion preop per surgery.  5.  Hypertensive Heart Disease:  Stable on  blocker, nitrate.  6.  HL:  LDL 25.  Cont statin.  7.  Anemia of chronic disease:  Stable.  8.  ICM/Chronic systolic CHF:  Euvolemic.  Volume mgmt per nephrology/HD.  Cont  blocker.  No ACEI/ARB/Spiro/ARNI in setting of ESRD.  Cont long acting nitrate for afterload reduction.  9.  Mouth ulcers:  Stable on magic mouthwash.  Signed, Murray Hodgkins NP  Personally seen and examined. Agree with above.  Feeling well this morning. Appreciative of team care. Reviewed Dr. Synthia Innocent hematology note and Dr. Prescott Gum. DDAVP, platelets perioperatively. Plan for surgery tomorrow. Redo CABG. RIMA, right SVG.  Candee Furbish, MD

## 2015-07-14 NOTE — Progress Notes (Signed)
7 Days Post-Op Procedure(s) (LRB): Left Heart Cath and Cors/Grafts Angiography (N/A) Subjective: Appreciate Dr Azucena Freed careful consult Will proceed with high risk CABG in am 6-8 Will give preop DDAVP and platelets Objective: Vital signs in last 24 hours: Temp:  [96.8 F (36 C)-98.5 F (36.9 C)] 98.5 F (36.9 C) (06/07 0626) Pulse Rate:  [55-62] 57 (06/07 0626) Cardiac Rhythm:  [-] Heart block;Bundle branch block (06/06 2049) Resp:  [10-21] 16 (06/07 0626) BP: (115-166)/(44-90) 115/44 mmHg (06/07 0626) SpO2:  [97 %-98 %] 98 % (06/07 0626) Weight:  [206 lb 11.2 oz (93.759 kg)-215 lb 6.2 oz (97.7 kg)] 206 lb 11.2 oz (93.759 kg) (06/07 0626)  Hemodynamic parameters for last 24 hours:  nsr  Intake/Output from previous day: 06/06 0701 - 06/07 0700 In: 750 [P.O.:240; I.V.:510] Out: 3000  Intake/Output this shift:         Exam    General- alert and comfortable   Lungs- clear without rales, wheezes   Cor- regular rate and rhythm, no murmur , gallop   Abdomen- soft, non-tender   Extremities - warm, non-tender, minimal edema   Neuro- oriented, appropriate, no focal weakness   Lab Results:  Recent Labs  07/13/15 0650 07/14/15 0401  WBC 4.9 5.0  HGB 9.3* 9.8*  HCT 29.0* 31.0*  PLT 56* 66*   BMET:  Recent Labs  07/13/15 0650  NA 136  K 4.3  CL 103  CO2 23  GLUCOSE 113*  BUN 61*  CREATININE 6.63*  CALCIUM 8.5*    PT/INR: No results for input(s): LABPROT, INR in the last 72 hours. ABG    Component Value Date/Time   TCO2 22 03/09/2015 2330   CBG (last 3)   Recent Labs  07/13/15 1136 07/13/15 1951 07/14/15 0722  GLUCAP 164* 106* 117*    Assessment/Plan: S/P Procedure(s) (LRB): Left Heart Cath and Cors/Grafts Angiography (N/A) Redo cabg in am HD run late this pm  If ok with renal Benefits and risks of procedure discussed with patient  LOS: 8 days    Joseph [name] 07/14/2015

## 2015-07-14 NOTE — Progress Notes (Signed)
S: No CP  No bleeding from AVF with use of smaller needles  O:BP 115/44 mmHg  Pulse 57  Temp(Src) 98.5 F (36.9 C) (Oral)  Resp 16  Ht 5\' 11"  (1.803 m)  Wt 93.759 kg (206 lb 11.2 oz)  BMI 28.84 kg/m2  SpO2 98%  Intake/Output Summary (Last 24 hours) at 07/14/15 0737 Last data filed at 07/14/15 0559  Gross per 24 hour  Intake    750 ml  Output   3000 ml  Net  -2250 ml   Weight change: 0.539 kg (1 lb 3 oz) Gen: awake and alert CVS: RRR 1/6 systolic M Resp: clear Abd:+ BS NTND Ext: No edema Lt forearm AVF + bruit NEURO: CNI Ox3 no asterixis   . aspirin EC  81 mg Oral Daily  . calcium acetate  1,334 mg Oral TID WC  . carvedilol  6.25 mg Oral BID WC  . darbepoetin (ARANESP) injection - DIALYSIS  60 mcg Intravenous Q Tue-HD  . insulin aspart  0-15 Units Subcutaneous TID WC  . insulin glargine  20 Units Subcutaneous QHS  . isosorbide mononitrate  90 mg Oral Daily  . magic mouthwash  10 mL Oral TID  . multivitamin  1 tablet Oral QHS  . pregabalin  75 mg Oral BID  . ranolazine  500 mg Oral BID  . rosuvastatin  20 mg Oral QHS  . sodium chloride flush  3 mL Intravenous Q12H  . sodium chloride flush  3 mL Intravenous Q12H   Ir Veno/ext/bi  07/12/2015  CLINICAL DATA:  Previous left saphenous vein harvest. Inconclusive vein mapping. Preop for possible cardiac surgery. EXAM: BILATERAL EXTREMITY VENOGRAPHY; IR ULTRASOUND GUIDANCE VASC ACCESS RIGHT; IR ULTRASOUND GUIDANCE VASC ACCESS LEFT ANESTHESIA/SEDATION: None provided MEDICATIONS: Lidocaine 1% subcutaneous PROCEDURE: The procedure, risks (including but not limited to bleeding, infection, organ damage ), benefits, and alternatives were explained to the patient. Questions regarding the procedure were encouraged and answered. The patient understands and consents to the procedure. Right medial calf prepped with chlorhexidine, draped in usual sterile fashion. Maximal barrier sterile technique was utilized including caps, mask, sterile gowns,  sterile gloves, sterile drape, hand hygiene and skin antiseptic. Patency of the right great saphenous vein just above the ankle was demonstrated and documented with ultrasound. Under real-time ultrasound guidance, the vein was accessed with a 21-gauge micropuncture needle, exchanged over a 018 guidewire for a micro dilator for venography. In similar fashion, left medial calf prepped and draped in usual sterile fashion. Patency of the left great saphenous vein just above the ankle was documented. Under real-time ultrasound guidance, the vein was accessed with a 21-gauge micropuncture needle, exchanged over a 018 guidewire for a dilator for venography. The dilators were removed and hemostasis achieved at the sites. The patient tolerated the procedure well. COMPLICATIONS: None immediate FINDINGS: On the right, patent right great saphenous vein in the distal calf was identified and catheterized. Venography shows patency of a segment of the right great saphenous vein to just above the knee. This segment measures approximately 3 mm diameter. Drains via collaterals into the deep venous system which is unremarkable. On the left, patent left great saphenous vein in the distal calf was identified and catheterized. Venography demonstrates patency of a small distal segment of the great saphenous vein seen to the lower calf. This segment measures approximately 2 mm diameter. Drainage via cutaneous and deep perforator veins into the deep venous system, which is unremarkable. IMPRESSION: 1. Segmental patency of distal components of the great  saphenous veins, measuring 2 mm diameter extending to the lower calf on the left, 3 mm on the right across the knee to the distal thigh. Electronically Signed   By: Lucrezia Europe M.D.   On: 07/12/2015 15:18   Ir US Guide Vasc Access Left  07/12/2015  CLINICAL DATA:  Previous left saphenous vein harvest. Inconclusive vein mapping. Preop for possible cardiac surgery. EXAM: BILATERAL EXTREMITY  VENOGRAPHY; IR ULTRASOUND GUIDANCE VASC ACCESS RIGHT; IR ULTRASOUND GUIDANCE VASC ACCESS LEFT ANESTHESIA/SEDATION: None provided MEDICATIONS: Lidocaine 1% subcutaneous PROCEDURE: The procedure, risks (including but not limited to bleeding, infection, organ damage ), benefits, and alternatives were explained to the patient. Questions regarding the procedure were encouraged and answered. The patient understands and consents to the procedure. Right medial calf prepped with chlorhexidine, draped in usual sterile fashion. Maximal barrier sterile technique was utilized including caps, mask, sterile gowns, sterile gloves, sterile drape, hand hygiene and skin antiseptic. Patency of the right great saphenous vein just above the ankle was demonstrated and documented with ultrasound. Under real-time ultrasound guidance, the vein was accessed with a 21-gauge micropuncture needle, exchanged over a 018 guidewire for a micro dilator for venography. In similar fashion, left medial calf prepped and draped in usual sterile fashion. Patency of the left great saphenous vein just above the ankle was documented. Under real-time ultrasound guidance, the vein was accessed with a 21-gauge micropuncture needle, exchanged over a 018 guidewire for a dilator for venography. The dilators were removed and hemostasis achieved at the sites. The patient tolerated the procedure well. COMPLICATIONS: None immediate FINDINGS: On the right, patent right great saphenous vein in the distal calf was identified and catheterized. Venography shows patency of a segment of the right great saphenous vein to just above the knee. This segment measures approximately 3 mm diameter. Drains via collaterals into the deep venous system which is unremarkable. On the left, patent left great saphenous vein in the distal calf was identified and catheterized. Venography demonstrates patency of a small distal segment of the great saphenous vein seen to the lower calf. This  segment measures approximately 2 mm diameter. Drainage via cutaneous and deep perforator veins into the deep venous system, which is unremarkable. IMPRESSION: 1. Segmental patency of distal components of the great saphenous veins, measuring 2 mm diameter extending to the lower calf on the left, 3 mm on the right across the knee to the distal thigh. Electronically Signed   By: Lucrezia Europe M.D.   On: 07/12/2015 15:18   Ir US Guide Vasc Access Right  07/12/2015  CLINICAL DATA:  Previous left saphenous vein harvest. Inconclusive vein mapping. Preop for possible cardiac surgery. EXAM: BILATERAL EXTREMITY VENOGRAPHY; IR ULTRASOUND GUIDANCE VASC ACCESS RIGHT; IR ULTRASOUND GUIDANCE VASC ACCESS LEFT ANESTHESIA/SEDATION: None provided MEDICATIONS: Lidocaine 1% subcutaneous PROCEDURE: The procedure, risks (including but not limited to bleeding, infection, organ damage ), benefits, and alternatives were explained to the patient. Questions regarding the procedure were encouraged and answered. The patient understands and consents to the procedure. Right medial calf prepped with chlorhexidine, draped in usual sterile fashion. Maximal barrier sterile technique was utilized including caps, mask, sterile gowns, sterile gloves, sterile drape, hand hygiene and skin antiseptic. Patency of the right great saphenous vein just above the ankle was demonstrated and documented with ultrasound. Under real-time ultrasound guidance, the vein was accessed with a 21-gauge micropuncture needle, exchanged over a 018 guidewire for a micro dilator for venography. In similar fashion, left medial calf prepped and draped in usual sterile  fashion. Patency of the left great saphenous vein just above the ankle was documented. Under real-time ultrasound guidance, the vein was accessed with a 21-gauge micropuncture needle, exchanged over a 018 guidewire for a dilator for venography. The dilators were removed and hemostasis achieved at the sites. The patient  tolerated the procedure well. COMPLICATIONS: None immediate FINDINGS: On the right, patent right great saphenous vein in the distal calf was identified and catheterized. Venography shows patency of a segment of the right great saphenous vein to just above the knee. This segment measures approximately 3 mm diameter. Drains via collaterals into the deep venous system which is unremarkable. On the left, patent left great saphenous vein in the distal calf was identified and catheterized. Venography demonstrates patency of a small distal segment of the great saphenous vein seen to the lower calf. This segment measures approximately 2 mm diameter. Drainage via cutaneous and deep perforator veins into the deep venous system, which is unremarkable. IMPRESSION: 1. Segmental patency of distal components of the great saphenous veins, measuring 2 mm diameter extending to the lower calf on the left, 3 mm on the right across the knee to the distal thigh. Electronically Signed   By: Lucrezia Europe M.D.   On: 07/12/2015 15:18   BMET    Component Value Date/Time   NA 136 07/13/2015 0650   NA 143 09/11/2014 1549   NA 143 01/05/2014 1334   NA 139 02/04/2008 1320   K 4.3 07/13/2015 0650   K 5.5* 01/05/2014 1334   K 4.3 02/04/2008 1320   CL 103 07/13/2015 0650   CL 112* 01/05/2014 1334   CL 102 02/04/2008 1320   CO2 23 07/13/2015 0650   CO2 21 01/05/2014 1334   CO2 28 02/04/2008 1320   GLUCOSE 113* 07/13/2015 0650   GLUCOSE 200* 09/11/2014 1549   GLUCOSE 104* 01/05/2014 1334   GLUCOSE 240* 02/04/2008 1320   BUN 61* 07/13/2015 0650   BUN 57* 09/11/2014 1549   BUN 88* 01/05/2014 1334   BUN 18 02/04/2008 1320   CREATININE 6.63* 07/13/2015 0650   CREATININE 3.52* 05/26/2014 1024   CREATININE 1.3* 02/04/2008 1320   CALCIUM 8.5* 07/13/2015 0650   CALCIUM 7.5* 11/06/2014 0421   CALCIUM 6.9* 05/26/2014 1024   CALCIUM 8.8 02/04/2008 1320   GFRNONAA 8* 07/13/2015 0650   GFRNONAA 17* 05/26/2014 1024   GFRNONAA 19*  01/05/2014 1334   GFRAA 9* 07/13/2015 0650   GFRAA 20* 05/26/2014 1024   GFRAA 23* 01/05/2014 1334   CBC    Component Value Date/Time   WBC 5.0 07/14/2015 0401   WBC 4.9 03/18/2015 1539   WBC 4.7 05/26/2014 1024   RBC 3.33* 07/14/2015 0401   RBC 2.91* 03/18/2015 1539   RBC 3.02* 11/06/2014 0421   RBC 3.61* 05/26/2014 1024   RBC 5.35 03/23/2008 1117   HGB 9.8* 07/14/2015 0401   HGB 10.2* 05/26/2014 1024   HGB 15.8 03/23/2008 1117   HCT 31.0* 07/14/2015 0401   HCT 25.8* 03/18/2015 1539   HCT 30.6* 05/26/2014 1024   HCT 46.2 03/23/2008 1117   PLT 66* 07/14/2015 0401   PLT 99* 03/18/2015 1539   PLT 80* 05/26/2014 1024   PLT 93* 03/23/2008 1117   MCV 93.1 07/14/2015 0401   MCV 89 03/18/2015 1539   MCV 85 05/26/2014 1024   MCV 86 03/23/2008 1117   MCH 29.4 07/14/2015 0401   MCH 29.2 03/18/2015 1539   MCH 28.3 05/26/2014 1024   MCH 29.5 03/23/2008 1117  MCHC 31.6 07/14/2015 0401   MCHC 32.9 03/18/2015 1539   MCHC 33.4 05/26/2014 1024   MCHC 34.1 03/23/2008 1117   RDW 14.9 07/14/2015 0401   RDW 16.8* 03/18/2015 1539   RDW 15.7* 05/26/2014 1024   RDW 12.7 03/23/2008 1117   LYMPHSABS 0.7* 05/25/2015 1343   LYMPHSABS 0.6* 09/11/2014 0000   LYMPHSABS 0.7* 05/26/2014 1024   LYMPHSABS 1.3 03/23/2008 1117   MONOABS 0.4 05/25/2015 1343   MONOABS 0.3 05/26/2014 1024   EOSABS 0.0 05/25/2015 1343   EOSABS 0.1 09/11/2014 0000   EOSABS 0.1 05/26/2014 1024   EOSABS 0.1 03/23/2008 1117   BASOSABS 0.0 05/25/2015 1343   BASOSABS 0.0 09/11/2014 0000   BASOSABS 0.0 05/26/2014 1024   BASOSABS 0.1 03/23/2008 1117     Assessment: 1. NSTEMI 2. DM 3. Anemia 4. HTN 5. ESRD  TTS 6. Thrombocytopenia  Plan: 1. Plan HD in AM if not going to surgery.  Dialysis went well with 16g needles    Verdon Ferrante T

## 2015-07-14 NOTE — Progress Notes (Signed)
ANTICOAGULATION CONSULT NOTE - Follow Up Consult  Pharmacy Consult for Heparin Indication: severe in-stent restenosis awaiting redo CABG  Allergies  Allergen Reactions  . Sulfa Antibiotics Hives and Itching  . Morphine And Related     Makes patient feel weird    Patient Measurements: Height: 5\' 11"  (180.3 cm) Weight: 206 lb 11.2 oz (93.759 kg) IBW/kg (Calculated) : 75.3 Heparin Dosing Weight: 95kg  Vital Signs: Temp: 98.5 F (36.9 C) (06/07 0626) Temp Source: Oral (06/07 0626) BP: 115/44 mmHg (06/07 0626) Pulse Rate: 57 (06/07 0626)  Labs:  Recent Labs  07/12/15 0430 07/12/15 2151 07/13/15 0040 07/13/15 0650 07/14/15 0401  HGB 9.9*  --   --  9.3* 9.8*  HCT 31.8*  --   --  29.0* 31.0*  PLT 58*  --   --  56* 66*  HEPARINUNFRC 0.63  --   --  0.57 0.55  CREATININE  --   --   --  6.63*  --   TROPONINI  --  0.06* 0.07* 0.08*  --     Estimated Creatinine Clearance: 12.6 mL/min (by C-G formula based on Cr of 6.63).   Medications:  Heparin @ 1700 units/hr  Assessment: 69yom with hx CABG and stents to left main and ostial circumflex, admitted with NSTEMI and started on heparin. Had cath 5/31 which showed severe in-stent restenosis of his left main and ostial circumflex as well as total occlusion of his native RCA, ostial LAD, and all saphenous vein grafts. Heparin was resumed post-cath with plans for redo CABG.  Heparin level is therapeutic at 0.55. Platelets low but up at 66 today - Dr. Darcey Nora sent HIT panel on 6/5 - will need to be negative before proceeding with surgery.  Goal of Therapy:  Heparin level 0.3-0.7 units/ml Monitor platelets by anticoagulation protocol: Yes   Plan:  1) Continue heparin at 1700 units/hr 2) Daily heparin level and CBC 3) Follow up HIT panel 4) Plan for redo CABG 6/8  Areyana Leoni C Krisinda Giovanni 07/14/2015,9:17 AM

## 2015-07-15 ENCOUNTER — Inpatient Hospital Stay (HOSPITAL_COMMUNITY): Payer: Medicare Other

## 2015-07-15 ENCOUNTER — Inpatient Hospital Stay (HOSPITAL_COMMUNITY): Payer: Medicare Other | Admitting: Anesthesiology

## 2015-07-15 ENCOUNTER — Encounter (HOSPITAL_COMMUNITY): Admission: EM | Disposition: E | Payer: Self-pay | Source: Home / Self Care | Attending: Cardiothoracic Surgery

## 2015-07-15 ENCOUNTER — Encounter (HOSPITAL_COMMUNITY): Payer: Self-pay | Admitting: Anesthesiology

## 2015-07-15 DIAGNOSIS — I2510 Atherosclerotic heart disease of native coronary artery without angina pectoris: Secondary | ICD-10-CM | POA: Diagnosis present

## 2015-07-15 DIAGNOSIS — I25110 Atherosclerotic heart disease of native coronary artery with unstable angina pectoris: Secondary | ICD-10-CM

## 2015-07-15 DIAGNOSIS — I9789 Other postprocedural complications and disorders of the circulatory system, not elsewhere classified: Secondary | ICD-10-CM

## 2015-07-15 HISTORY — PX: TEE WITHOUT CARDIOVERSION: SHX5443

## 2015-07-15 HISTORY — PX: CORONARY ARTERY BYPASS GRAFT: SHX141

## 2015-07-15 HISTORY — PX: EXPLORATION POST OPERATIVE OPEN HEART: SHX5061

## 2015-07-15 LAB — URINALYSIS, ROUTINE W REFLEX MICROSCOPIC
Glucose, UA: 250 mg/dL — AB
Ketones, ur: 15 mg/dL — AB
Leukocytes, UA: NEGATIVE
Nitrite: NEGATIVE
Protein, ur: >300 mg/dL — AB
Specific Gravity, Urine: 1.018 (ref 1.005–1.030)
pH: 7 (ref 5.0–8.0)

## 2015-07-15 LAB — CBC
HCT: 28 % — ABNORMAL LOW (ref 39.0–52.0)
HCT: 31.3 % — ABNORMAL LOW (ref 39.0–52.0)
HEMATOCRIT: 16 % — AB (ref 39.0–52.0)
HEMOGLOBIN: 5.1 g/dL — AB (ref 13.0–17.0)
Hemoglobin: 8.8 g/dL — ABNORMAL LOW (ref 13.0–17.0)
Hemoglobin: 10.4 g/dL — ABNORMAL LOW (ref 13.0–17.0)
MCH: 29.6 pg (ref 26.0–34.0)
MCH: 28 pg (ref 26.0–34.0)
MCH: 28.7 pg (ref 26.0–34.0)
MCHC: 31.4 g/dL (ref 30.0–36.0)
MCHC: 31.9 g/dL (ref 30.0–36.0)
MCHC: 33.2 g/dL (ref 30.0–36.0)
MCV: 94.3 fL (ref 78.0–100.0)
MCV: 87.9 fL (ref 78.0–100.0)
MCV: 86.2 fL (ref 78.0–100.0)
PLATELETS: 70 10*3/uL — AB (ref 150–400)
Platelets: 29 10*3/uL — CL (ref 150–400)
Platelets: 46 10*3/uL — ABNORMAL LOW (ref 150–400)
RBC: 2.97 MIL/uL — AB (ref 4.22–5.81)
RBC: 1.82 MIL/uL — AB (ref 4.22–5.81)
RBC: 3.63 MIL/uL — ABNORMAL LOW (ref 4.22–5.81)
RDW: 15.4 % (ref 11.5–15.5)
RDW: 15.4 % (ref 11.5–15.5)
RDW: 14.8 % (ref 11.5–15.5)
WBC: 7.5 10*3/uL (ref 4.0–10.5)
WBC: 6.4 10*3/uL (ref 4.0–10.5)
WBC: 11.7 10*3/uL — ABNORMAL HIGH (ref 4.0–10.5)

## 2015-07-15 LAB — POCT I-STAT, CHEM 8
BUN: 27 mg/dL — AB (ref 6–20)
BUN: 28 mg/dL — ABNORMAL HIGH (ref 6–20)
BUN: 14 mg/dL (ref 6–20)
BUN: 26 mg/dL — ABNORMAL HIGH (ref 6–20)
BUN: 28 mg/dL — AB (ref 6–20)
BUN: 27 mg/dL — AB (ref 6–20)
BUN: 27 mg/dL — AB (ref 6–20)
BUN: 28 mg/dL — AB (ref 6–20)
BUN: 24 mg/dL — AB (ref 6–20)
CALCIUM ION: 0.63 mmol/L — AB (ref 1.13–1.30)
CALCIUM ION: 0.86 mmol/L — AB (ref 1.13–1.30)
CALCIUM ION: 0.72 mmol/L — AB (ref 1.13–1.30)
CHLORIDE: 98 mmol/L — AB (ref 101–111)
CHLORIDE: 101 mmol/L (ref 101–111)
CHLORIDE: 116 mmol/L — AB (ref 101–111)
CHLORIDE: 100 mmol/L — AB (ref 101–111)
CHLORIDE: 97 mmol/L — AB (ref 101–111)
CHLORIDE: 100 mmol/L — AB (ref 101–111)
CREATININE: 4.3 mg/dL — AB (ref 0.61–1.24)
CREATININE: 4.2 mg/dL — AB (ref 0.61–1.24)
CREATININE: 2 mg/dL — AB (ref 0.61–1.24)
CREATININE: 3.2 mg/dL — AB (ref 0.61–1.24)
CREATININE: 3.2 mg/dL — AB (ref 0.61–1.24)
CREATININE: 3.1 mg/dL — AB (ref 0.61–1.24)
CREATININE: 3 mg/dL — AB (ref 0.61–1.24)
CREATININE: 3 mg/dL — AB (ref 0.61–1.24)
Calcium, Ion: 1.21 mmol/L (ref 1.13–1.30)
Calcium, Ion: 1.2 mmol/L (ref 1.13–1.30)
Calcium, Ion: 0.99 mmol/L — ABNORMAL LOW (ref 1.13–1.30)
Calcium, Ion: 0.79 mmol/L — ABNORMAL LOW (ref 1.13–1.30)
Calcium, Ion: 0.75 mmol/L — ABNORMAL LOW (ref 1.13–1.30)
Calcium, Ion: 1.54 mmol/L — ABNORMAL HIGH (ref 1.13–1.30)
Chloride: 99 mmol/L — ABNORMAL LOW (ref 101–111)
Chloride: 99 mmol/L — ABNORMAL LOW (ref 101–111)
Chloride: 107 mmol/L (ref 101–111)
Creatinine, Ser: 3.3 mg/dL — ABNORMAL HIGH (ref 0.61–1.24)
GLUCOSE: 160 mg/dL — AB (ref 65–99)
GLUCOSE: 134 mg/dL — AB (ref 65–99)
GLUCOSE: 263 mg/dL — AB (ref 65–99)
GLUCOSE: 282 mg/dL — AB (ref 65–99)
GLUCOSE: 273 mg/dL — AB (ref 65–99)
Glucose, Bld: 137 mg/dL — ABNORMAL HIGH (ref 65–99)
Glucose, Bld: 291 mg/dL — ABNORMAL HIGH (ref 65–99)
Glucose, Bld: 281 mg/dL — ABNORMAL HIGH (ref 65–99)
Glucose, Bld: 258 mg/dL — ABNORMAL HIGH (ref 65–99)
HCT: 24 % — ABNORMAL LOW (ref 39.0–52.0)
HCT: 27 % — ABNORMAL LOW (ref 39.0–52.0)
HCT: 29 % — ABNORMAL LOW (ref 39.0–52.0)
HEMATOCRIT: 26 % — AB (ref 39.0–52.0)
HEMATOCRIT: 27 % — AB (ref 39.0–52.0)
HEMATOCRIT: 25 % — AB (ref 39.0–52.0)
HEMATOCRIT: 29 % — AB (ref 39.0–52.0)
HEMATOCRIT: 27 % — AB (ref 39.0–52.0)
HEMATOCRIT: 28 % — AB (ref 39.0–52.0)
HEMOGLOBIN: 9.9 g/dL — AB (ref 13.0–17.0)
HEMOGLOBIN: 9.2 g/dL — AB (ref 13.0–17.0)
Hemoglobin: 8.8 g/dL — ABNORMAL LOW (ref 13.0–17.0)
Hemoglobin: 8.2 g/dL — ABNORMAL LOW (ref 13.0–17.0)
Hemoglobin: 9.2 g/dL — ABNORMAL LOW (ref 13.0–17.0)
Hemoglobin: 9.2 g/dL — ABNORMAL LOW (ref 13.0–17.0)
Hemoglobin: 9.9 g/dL — ABNORMAL LOW (ref 13.0–17.0)
Hemoglobin: 8.5 g/dL — ABNORMAL LOW (ref 13.0–17.0)
Hemoglobin: 9.5 g/dL — ABNORMAL LOW (ref 13.0–17.0)
POTASSIUM: 3.7 mmol/L (ref 3.5–5.1)
POTASSIUM: 4 mmol/L (ref 3.5–5.1)
POTASSIUM: 4.3 mmol/L (ref 3.5–5.1)
POTASSIUM: 5.1 mmol/L (ref 3.5–5.1)
POTASSIUM: 4.8 mmol/L (ref 3.5–5.1)
POTASSIUM: 3.6 mmol/L (ref 3.5–5.1)
Potassium: 2.5 mmol/L — CL (ref 3.5–5.1)
Potassium: 5.3 mmol/L — ABNORMAL HIGH (ref 3.5–5.1)
Potassium: 4 mmol/L (ref 3.5–5.1)
SODIUM: 138 mmol/L (ref 135–145)
SODIUM: 135 mmol/L (ref 135–145)
SODIUM: 142 mmol/L (ref 135–145)
Sodium: 139 mmol/L (ref 135–145)
Sodium: 139 mmol/L (ref 135–145)
Sodium: 138 mmol/L (ref 135–145)
Sodium: 149 mmol/L — ABNORMAL HIGH (ref 135–145)
Sodium: 138 mmol/L (ref 135–145)
Sodium: 141 mmol/L (ref 135–145)
TCO2: 30 mmol/L (ref 0–100)
TCO2: 30 mmol/L (ref 0–100)
TCO2: 16 mmol/L (ref 0–100)
TCO2: 25 mmol/L (ref 0–100)
TCO2: 25 mmol/L (ref 0–100)
TCO2: 25 mmol/L (ref 0–100)
TCO2: 24 mmol/L (ref 0–100)
TCO2: 23 mmol/L (ref 0–100)
TCO2: 23 mmol/L (ref 0–100)

## 2015-07-15 LAB — POCT I-STAT 3, ART BLOOD GAS (G3+)
ACID-BASE DEFICIT: 4 mmol/L — AB (ref 0.0–2.0)
ACID-BASE DEFICIT: 8 mmol/L — AB (ref 0.0–2.0)
ACID-BASE DEFICIT: 4 mmol/L — AB (ref 0.0–2.0)
Acid-base deficit: 3 mmol/L — ABNORMAL HIGH (ref 0.0–2.0)
BICARBONATE: 24.6 meq/L — AB (ref 20.0–24.0)
BICARBONATE: 22.2 meq/L (ref 20.0–24.0)
Bicarbonate: 20.9 mEq/L (ref 20.0–24.0)
Bicarbonate: 23.1 mEq/L (ref 20.0–24.0)
O2 SAT: 100 %
O2 SAT: 100 %
O2 Saturation: 100 %
O2 Saturation: 80 %
PCO2 ART: 62.5 mmHg — AB (ref 35.0–45.0)
PCO2 ART: 43.5 mmHg (ref 35.0–45.0)
PH ART: 7.132 — AB (ref 7.350–7.450)
PH ART: 7.317 — AB (ref 7.350–7.450)
PO2 ART: 230 mmHg — AB (ref 80.0–100.0)
TCO2: 26 mmol/L (ref 0–100)
TCO2: 24 mmol/L (ref 0–100)
TCO2: 23 mmol/L (ref 0–100)
TCO2: 25 mmol/L (ref 0–100)
pCO2 arterial: 59 mmHg (ref 35.0–45.0)
pCO2 arterial: 46 mmHg — ABNORMAL HIGH (ref 35.0–45.0)
pH, Arterial: 7.228 — ABNORMAL LOW (ref 7.350–7.450)
pH, Arterial: 7.293 — ABNORMAL LOW (ref 7.350–7.450)
pO2, Arterial: 372 mmHg — ABNORMAL HIGH (ref 80.0–100.0)
pO2, Arterial: 59 mmHg — ABNORMAL LOW (ref 80.0–100.0)
pO2, Arterial: 232 mmHg — ABNORMAL HIGH (ref 80.0–100.0)

## 2015-07-15 LAB — URINE MICROSCOPIC-ADD ON

## 2015-07-15 LAB — BASIC METABOLIC PANEL
Anion gap: 7 (ref 5–15)
BUN: 24 mg/dL — ABNORMAL HIGH (ref 6–20)
CO2: 30 mmol/L (ref 22–32)
Calcium: 8.8 mg/dL — ABNORMAL LOW (ref 8.9–10.3)
Chloride: 100 mmol/L — ABNORMAL LOW (ref 101–111)
Creatinine, Ser: 4.24 mg/dL — ABNORMAL HIGH (ref 0.61–1.24)
GFR calc Af Amer: 15 mL/min — ABNORMAL LOW (ref >60)
GFR calc non Af Amer: 13 mL/min — ABNORMAL LOW (ref >60)
Glucose, Bld: 188 mg/dL — ABNORMAL HIGH (ref 65–99)
Potassium: 3.9 mmol/L (ref 3.5–5.1)
Sodium: 137 mmol/L (ref 135–145)

## 2015-07-15 LAB — DIC (DISSEMINATED INTRAVASCULAR COAGULATION)PANEL
D-Dimer, Quant: 0.56 ug/mL-FEU — ABNORMAL HIGH (ref 0.00–0.50)
D-Dimer, Quant: 0.62 ug/mL-FEU — ABNORMAL HIGH (ref 0.00–0.50)
Fibrinogen: 217 mg/dL (ref 204–475)
INR: 1.54 — ABNORMAL HIGH (ref 0.00–1.49)
INR: 1.43 (ref 0.00–1.49)
Platelets: 59 10*3/uL — ABNORMAL LOW (ref 150–400)
Platelets: 45 10*3/uL — ABNORMAL LOW (ref 150–400)
Prothrombin Time: 17.6 seconds — ABNORMAL HIGH (ref 11.6–15.2)
Smear Review: NONE SEEN
aPTT: 105 seconds — ABNORMAL HIGH (ref 24–37)
aPTT: 94 seconds — ABNORMAL HIGH (ref 24–37)

## 2015-07-15 LAB — POCT I-STAT 4, (NA,K, GLUC, HGB,HCT)
Glucose, Bld: 230 mg/dL — ABNORMAL HIGH (ref 65–99)
HCT: 23 % — ABNORMAL LOW (ref 39.0–52.0)
Hemoglobin: 7.8 g/dL — ABNORMAL LOW (ref 13.0–17.0)
POTASSIUM: 3.6 mmol/L (ref 3.5–5.1)
SODIUM: 146 mmol/L — AB (ref 135–145)

## 2015-07-15 LAB — APTT
aPTT: 113 seconds — ABNORMAL HIGH (ref 24–37)
aPTT: 142 seconds — ABNORMAL HIGH (ref 24–37)

## 2015-07-15 LAB — HIV ANTIBODY (ROUTINE TESTING W REFLEX): HIV Screen 4th Generation wRfx: NONREACTIVE

## 2015-07-15 LAB — HEPARIN LEVEL (UNFRACTIONATED): HEPARIN UNFRACTIONATED: 0.6 [IU]/mL (ref 0.30–0.70)

## 2015-07-15 LAB — PREPARE RBC (CROSSMATCH)

## 2015-07-15 LAB — PROTIME-INR
INR: 1.22 (ref 0.00–1.49)
INR: 1.6 — ABNORMAL HIGH (ref 0.00–1.49)
PROTHROMBIN TIME: 19.1 s — AB (ref 11.6–15.2)
Prothrombin Time: 15.6 seconds — ABNORMAL HIGH (ref 11.6–15.2)

## 2015-07-15 LAB — GLUCOSE, CAPILLARY: Glucose-Capillary: 195 mg/dL — ABNORMAL HIGH (ref 65–99)

## 2015-07-15 LAB — DIC (DISSEMINATED INTRAVASCULAR COAGULATION) PANEL
FIBRINOGEN: 148 mg/dL — AB (ref 204–475)
PROTHROMBIN TIME: 18.6 s — AB (ref 11.6–15.2)
SMEAR REVIEW: NONE SEEN

## 2015-07-15 LAB — HEMOGLOBIN AND HEMATOCRIT, BLOOD
HCT: 32.6 % — ABNORMAL LOW (ref 39.0–52.0)
Hemoglobin: 11.4 g/dL — ABNORMAL LOW (ref 13.0–17.0)

## 2015-07-15 LAB — TROPONIN I: TROPONIN I: 0.15 ng/mL — AB (ref <0.031)

## 2015-07-15 LAB — FIBRINOGEN: Fibrinogen: 62 mg/dL — CL (ref 204–475)

## 2015-07-15 LAB — PLATELET COUNT: Platelets: 12 10*3/uL — CL (ref 150–400)

## 2015-07-15 SURGERY — EXPLORATION POST OPERATIVE OPEN HEART
Anesthesia: General | Site: Chest

## 2015-07-15 SURGERY — REDO CORONARY ARTERY BYPASS GRAFTING (CABG)
Anesthesia: General | Site: Chest

## 2015-07-15 MED ORDER — DEXMEDETOMIDINE HCL IN NACL 400 MCG/100ML IV SOLN
0.1000 ug/kg/h | INTRAVENOUS | Status: DC
  Administered 2015-07-15: 0.6 ug/h via INTRAVENOUS
  Filled 2015-07-15: qty 100

## 2015-07-15 MED ORDER — CEFUROXIME SODIUM 750 MG IJ SOLR
750.0000 mg | INTRAMUSCULAR | Status: DC
  Filled 2015-07-15: qty 750

## 2015-07-15 MED ORDER — NITROGLYCERIN IN D5W 200-5 MCG/ML-% IV SOLN: 0.0000 ug/min | INTRAVENOUS | Status: DC

## 2015-07-15 MED ORDER — MIDAZOLAM HCL 5 MG/5ML IJ SOLN
INTRAMUSCULAR | Status: DC | PRN
  Administered 2015-07-15: 1 mg via INTRAVENOUS
  Administered 2015-07-15: 2 mg via INTRAVENOUS
  Administered 2015-07-15: 7 mg via INTRAVENOUS

## 2015-07-15 MED ORDER — NOREPINEPHRINE BITARTRATE 1 MG/ML IV SOLN
4000.0000 ug | INTRAVENOUS | Status: DC | PRN
  Administered 2015-07-15: 7 ug/min via INTRAVENOUS

## 2015-07-15 MED ORDER — FENTANYL CITRATE (PF) 100 MCG/2ML IJ SOLN
INTRAMUSCULAR | Status: DC | PRN
  Administered 2015-07-15: 100 ug via INTRAVENOUS

## 2015-07-15 MED ORDER — 0.9 % SODIUM CHLORIDE (POUR BTL) OPTIME
TOPICAL | Status: DC | PRN
  Administered 2015-07-15: 1000 mL

## 2015-07-15 MED ORDER — HEMOSTATIC AGENTS (NO CHARGE) OPTIME
TOPICAL | Status: DC | PRN
  Administered 2015-07-15: 1 via TOPICAL

## 2015-07-15 MED ORDER — LACTATED RINGERS IV SOLN
INTRAVENOUS | Status: DC | PRN
  Administered 2015-07-15: 22:00:00 via INTRAVENOUS

## 2015-07-15 MED ORDER — MIDAZOLAM HCL 10 MG/2ML IJ SOLN
INTRAMUSCULAR | Status: AC
  Filled 2015-07-15: qty 2

## 2015-07-15 MED ORDER — SODIUM BICARBONATE 8.4 % IV SOLN
50.0000 meq | Freq: Once | INTRAVENOUS | Status: AC
  Administered 2015-07-15: 50 meq via INTRAVENOUS

## 2015-07-15 MED ORDER — ROCURONIUM BROMIDE 10 MG/ML (PF) SYRINGE
PREFILLED_SYRINGE | INTRAVENOUS | Status: DC | PRN
  Administered 2015-07-15: 50 mg via INTRAVENOUS
  Administered 2015-07-15: 10 mg via INTRAVENOUS
  Administered 2015-07-15: 40 mg via INTRAVENOUS

## 2015-07-15 MED ORDER — SODIUM CHLORIDE 0.9 % IV SOLN
250.0000 mL | INTRAVENOUS | Status: DC
  Administered 2015-07-26: 10 mL/h via INTRAVENOUS

## 2015-07-15 MED ORDER — LIDOCAINE IN D5W 4-5 MG/ML-% IV SOLN
1.0000 mg/min | INTRAVENOUS | Status: AC
  Administered 2015-07-15: 2 mg/min via INTRAVENOUS
  Filled 2015-07-15: qty 500

## 2015-07-15 MED ORDER — HYDROMORPHONE HCL 1 MG/ML IJ SOLN
INTRAMUSCULAR | Status: AC
  Administered 2015-07-15: 01:00:00
  Filled 2015-07-15: qty 1

## 2015-07-15 MED ORDER — FENTANYL CITRATE (PF) 250 MCG/5ML IJ SOLN
INTRAMUSCULAR | Status: AC
  Filled 2015-07-15: qty 20

## 2015-07-15 MED ORDER — SODIUM CHLORIDE 0.9% FLUSH
3.0000 mL | Freq: Two times a day (BID) | INTRAVENOUS | Status: DC
  Administered 2015-07-16 – 2015-07-24 (×12): 3 mL via INTRAVENOUS

## 2015-07-15 MED ORDER — CALCIUM CHLORIDE 10 % IV SOLN
INTRAVENOUS | Status: DC | PRN
  Administered 2015-07-15: 1000 mg via INTRAVENOUS
  Administered 2015-07-15: 400 mg via INTRAVENOUS
  Administered 2015-07-15: 200 mg via INTRAVENOUS
  Administered 2015-07-15: 1000 mg via INTRAVENOUS
  Administered 2015-07-15: 1000 mg

## 2015-07-15 MED ORDER — ACETAMINOPHEN 160 MG/5ML PO SOLN: 650.0000 mg | Freq: Once | ORAL | Status: AC

## 2015-07-15 MED ORDER — ROCURONIUM BROMIDE 50 MG/5ML IV SOLN
INTRAVENOUS | Status: AC
  Filled 2015-07-15: qty 1

## 2015-07-15 MED ORDER — ASPIRIN 81 MG PO CHEW: 324.0000 mg | CHEWABLE_TABLET | Freq: Every day | ORAL | Status: DC

## 2015-07-15 MED ORDER — CHLORHEXIDINE GLUCONATE 0.12 % MT SOLN
15.0000 mL | OROMUCOSAL | Status: AC
  Administered 2015-07-15: 15 mL via OROMUCOSAL

## 2015-07-15 MED ORDER — LACTATED RINGERS IV SOLN: 500.0000 mL | Freq: Once | INTRAVENOUS | Status: DC | PRN

## 2015-07-15 MED ORDER — SODIUM CHLORIDE 0.9 % IV BOLUS (SEPSIS)
250.0000 mL | INTRAVENOUS | Status: AC
  Administered 2015-07-15: 250 mL via INTRAVENOUS

## 2015-07-15 MED ORDER — SODIUM CHLORIDE 0.9 % IV SOLN
INTRAVENOUS | Status: DC
  Administered 2015-07-15: 19.8 [IU]/h via INTRAVENOUS
  Administered 2015-07-16: 10.3 [IU]/h via INTRAVENOUS
  Administered 2015-07-17: 11.5 [IU]/h via INTRAVENOUS
  Administered 2015-07-18: 4.4 [IU]/h via INTRAVENOUS
  Administered 2015-07-19: 5.7 [IU]/h via INTRAVENOUS
  Administered 2015-07-20: 4.2 [IU]/h via INTRAVENOUS
  Administered 2015-07-21: 10.1 [IU]/h via INTRAVENOUS
  Administered 2015-07-22: 6.5 [IU]/h via INTRAVENOUS
  Administered 2015-07-23: 6.3 [IU]/h via INTRAVENOUS
  Administered 2015-07-24: 22.3 [IU]/h via INTRAVENOUS
  Administered 2015-07-25: 31 [IU]/h via INTRAVENOUS
  Administered 2015-07-25: 24.5 [IU]/h via INTRAVENOUS
  Administered 2015-07-25: 29.7 [IU]/h via INTRAVENOUS
  Administered 2015-07-26: 11:00:00 via INTRAVENOUS
  Administered 2015-07-27 (×2): 6 [IU]/h via INTRAVENOUS
  Administered 2015-07-28: 4.7 [IU]/h via INTRAVENOUS
  Filled 2015-07-15 (×19): qty 2.5

## 2015-07-15 MED ORDER — SODIUM CHLORIDE 0.9 % IV SOLN
INTRAVENOUS | Status: DC | PRN
  Administered 2015-07-15: 10:00:00 via INTRAVENOUS

## 2015-07-15 MED ORDER — DEXTROSE 5 % IV SOLN
0.0000 ug/min | INTRAVENOUS | Status: DC
  Administered 2015-07-16: 20 ug/min via INTRAVENOUS
  Administered 2015-07-17: 30 ug/min via INTRAVENOUS
  Administered 2015-07-17: 27 ug/min via INTRAVENOUS
  Administered 2015-07-18: 30 ug/min via INTRAVENOUS
  Administered 2015-07-18: 34 ug/min via INTRAVENOUS
  Administered 2015-07-18: 32 ug/min via INTRAVENOUS
  Administered 2015-07-20: 4 ug/min via INTRAVENOUS
  Administered 2015-07-22: 6 ug/min via INTRAVENOUS
  Administered 2015-07-24: 9 ug/min via INTRAVENOUS
  Administered 2015-07-28: 3 ug/min via INTRAVENOUS
  Administered 2015-07-29: 18 ug/min via INTRAVENOUS
  Administered 2015-07-30: 22 ug/min via INTRAVENOUS
  Administered 2015-07-30: 20 ug/min via INTRAVENOUS
  Administered 2015-07-30: 40 ug/min via INTRAVENOUS
  Administered 2015-07-31: 32.96 ug/min via INTRAVENOUS
  Administered 2015-07-31: 30 ug/min via INTRAVENOUS
  Administered 2015-07-31: 33 ug/min via INTRAVENOUS
  Administered 2015-08-01 (×2): 40 ug/min via INTRAVENOUS
  Administered 2015-08-02: 55 ug/min via INTRAVENOUS
  Administered 2015-08-02: 38 ug/min via INTRAVENOUS
  Administered 2015-08-02: 50 ug/min via INTRAVENOUS
  Administered 2015-08-02: 8 ug/min via INTRAVENOUS
  Administered 2015-08-03: 35 ug/min via INTRAVENOUS
  Administered 2015-08-03: 25 ug/min via INTRAVENOUS
  Administered 2015-08-04: 27 ug/min via INTRAVENOUS
  Administered 2015-08-04: 28 ug/min via INTRAVENOUS
  Administered 2015-08-04: 31 ug/min via INTRAVENOUS
  Administered 2015-08-05: 42 ug/min via INTRAVENOUS
  Administered 2015-08-05: 38 ug/min via INTRAVENOUS
  Administered 2015-08-06 (×2): 34 ug/min via INTRAVENOUS
  Administered 2015-08-07: 37 ug/min via INTRAVENOUS
  Administered 2015-08-07: 42 ug/min via INTRAVENOUS
  Administered 2015-08-07: 39 ug/min via INTRAVENOUS
  Administered 2015-08-07: 46 ug/min via INTRAVENOUS
  Administered 2015-08-08: 50 ug/min via INTRAVENOUS
  Administered 2015-08-08: 49 ug/min via INTRAVENOUS
  Administered 2015-08-08: 50 ug/min via INTRAVENOUS
  Administered 2015-08-08: 47 ug/min via INTRAVENOUS
  Administered 2015-08-09 – 2015-08-10 (×5): 50 ug/min via INTRAVENOUS
  Filled 2015-07-15 (×51): qty 16

## 2015-07-15 MED ORDER — CALCIUM CHLORIDE 10 % IV SOLN
INTRAVENOUS | Status: AC
  Filled 2015-07-15: qty 10

## 2015-07-15 MED ORDER — ASPIRIN EC 325 MG PO TBEC: 325.0000 mg | DELAYED_RELEASE_TABLET | Freq: Every day | ORAL | Status: DC

## 2015-07-15 MED ORDER — HEMOSTATIC AGENTS (NO CHARGE) OPTIME
TOPICAL | Status: DC | PRN
  Administered 2015-07-15 (×2): 1 via TOPICAL

## 2015-07-15 MED ORDER — PHENYLEPHRINE HCL 10 MG/ML IJ SOLN
0.0000 ug/min | INTRAVENOUS | Status: DC
  Filled 2015-07-15: qty 4

## 2015-07-15 MED ORDER — SODIUM CHLORIDE 0.9 % IV SOLN
30.0000 ug | INTRAVENOUS | Status: AC
  Administered 2015-07-15: 30 ug via INTRAVENOUS
  Filled 2015-07-15: qty 7.5

## 2015-07-15 MED ORDER — HEPARIN SODIUM (PORCINE) 1000 UNIT/ML IJ SOLN
INTRAMUSCULAR | Status: AC
  Filled 2015-07-15: qty 3

## 2015-07-15 MED ORDER — LIDOCAINE IN D5W 4-5 MG/ML-% IV SOLN
INTRAVENOUS | Status: DC | PRN
  Administered 2015-07-15: 1 mg/min via INTRAVENOUS

## 2015-07-15 MED ORDER — SODIUM CHLORIDE 0.9 % IJ SOLN
OROMUCOSAL | Status: DC | PRN
  Administered 2015-07-15: 1 mL via TOPICAL

## 2015-07-15 MED ORDER — ARTIFICIAL TEARS OP OINT
TOPICAL_OINTMENT | OPHTHALMIC | Status: DC | PRN
  Administered 2015-07-15: 1 via OPHTHALMIC

## 2015-07-15 MED ORDER — SODIUM CHLORIDE 0.9 % IV SOLN: Freq: Once | INTRAVENOUS | Status: DC

## 2015-07-15 MED ORDER — METOPROLOL TARTRATE 5 MG/5ML IV SOLN
2.5000 mg | INTRAVENOUS | Status: DC | PRN
  Filled 2015-07-15: qty 5

## 2015-07-15 MED ORDER — HYDROMORPHONE HCL 1 MG/ML IJ SOLN: 1.0000 mg | Freq: Once | INTRAMUSCULAR | Status: AC

## 2015-07-15 MED ORDER — SODIUM CHLORIDE 0.45 % IV SOLN: INTRAVENOUS | Status: DC | PRN

## 2015-07-15 MED ORDER — PROPOFOL 10 MG/ML IV BOLUS
INTRAVENOUS | Status: DC | PRN
  Administered 2015-07-15: 90 mg via INTRAVENOUS

## 2015-07-15 MED ORDER — PROTAMINE SULFATE 10 MG/ML IV SOLN
INTRAVENOUS | Status: DC | PRN
  Administered 2015-07-15: 150 mg via INTRAVENOUS
  Administered 2015-07-15: 30 mg via INTRAVENOUS

## 2015-07-15 MED ORDER — CALCIUM CHLORIDE 10 % IV SOLN
INTRAVENOUS | Status: DC | PRN
  Administered 2015-07-15 – 2015-07-16 (×5): 0.5 g via INTRAVENOUS

## 2015-07-15 MED ORDER — LACTATED RINGERS IV SOLN: INTRAVENOUS | Status: DC

## 2015-07-15 MED ORDER — FENTANYL CITRATE (PF) 250 MCG/5ML IJ SOLN
INTRAMUSCULAR | Status: AC
  Filled 2015-07-15: qty 5

## 2015-07-15 MED ORDER — INSULIN REGULAR BOLUS VIA INFUSION
0.0000 [IU] | Freq: Three times a day (TID) | INTRAVENOUS | Status: DC
Start: 2015-07-16 — End: 2015-07-28
  Filled 2015-07-15: qty 10

## 2015-07-15 MED ORDER — DOPAMINE-DEXTROSE 3.2-5 MG/ML-% IV SOLN
0.0000 ug/kg/min | INTRAVENOUS | Status: DC
Start: 2015-07-15 — End: 2015-07-16

## 2015-07-15 MED ORDER — BISACODYL 10 MG RE SUPP
10.0000 mg | Freq: Every day | RECTAL | Status: DC
  Administered 2015-07-20 – 2015-07-30 (×6): 10 mg via RECTAL
  Filled 2015-07-15 (×6): qty 1

## 2015-07-15 MED ORDER — DOCUSATE SODIUM 100 MG PO CAPS: 200.0000 mg | ORAL_CAPSULE | Freq: Every day | ORAL | Status: DC

## 2015-07-15 MED ORDER — SODIUM CHLORIDE 0.9 % IV SOLN
Freq: Once | INTRAVENOUS | Status: AC
  Administered 2015-07-15: 22:00:00 via INTRAVENOUS

## 2015-07-15 MED ORDER — NOREPINEPHRINE BITARTRATE 1 MG/ML IV SOLN
0.0000 ug/min | INTRAVENOUS | Status: DC
  Administered 2015-07-15: 10 ug/min via INTRAVENOUS
  Filled 2015-07-15: qty 4

## 2015-07-15 MED ORDER — EPHEDRINE SULFATE 50 MG/ML IJ SOLN
INTRAMUSCULAR | Status: DC | PRN
  Administered 2015-07-15: 10 mg via INTRAVENOUS
  Administered 2015-07-15: 15 mg via INTRAVENOUS

## 2015-07-15 MED ORDER — DEXMEDETOMIDINE HCL IN NACL 200 MCG/50ML IV SOLN
0.0000 ug/kg/h | INTRAVENOUS | Status: DC
Start: 2015-07-15 — End: 2015-07-16
  Filled 2015-07-15 (×2): qty 50

## 2015-07-15 MED ORDER — METOPROLOL TARTRATE 5 MG/5ML IV SOLN
5.0000 mg | Freq: Once | INTRAVENOUS | Status: AC
  Administered 2015-07-15: 5 mg via INTRAVENOUS

## 2015-07-15 MED ORDER — GLYCOPYRROLATE 0.2 MG/ML IJ SOLN
INTRAMUSCULAR | Status: DC | PRN
  Administered 2015-07-15: 0.4 mg via INTRAVENOUS
  Administered 2015-07-15: 0.2 mg via INTRAVENOUS

## 2015-07-15 MED ORDER — GLUTARALDEHYDE 0.625% SOAKING SOLUTION
TOPICAL | Status: DC | PRN
  Filled 2015-07-15: qty 50

## 2015-07-15 MED ORDER — MIDAZOLAM HCL 2 MG/2ML IJ SOLN
2.0000 mg | INTRAMUSCULAR | Status: DC | PRN
  Administered 2015-07-27 – 2015-07-28 (×3): 2 mg via INTRAVENOUS
  Administered 2015-07-28: 1 mg via INTRAVENOUS
  Administered 2015-07-28: 2 mg via INTRAVENOUS
  Administered 2015-07-28: 1 mg via INTRAVENOUS
  Administered 2015-07-28: 2 mg via INTRAVENOUS
  Administered 2015-07-28 – 2015-07-29 (×2): 1 mg via INTRAVENOUS
  Administered 2015-07-29 – 2015-08-01 (×4): 2 mg via INTRAVENOUS
  Administered 2015-08-02 – 2015-08-04 (×4): 1 mg via INTRAVENOUS
  Administered 2015-08-05 – 2015-08-06 (×2): 2 mg via INTRAVENOUS
  Filled 2015-07-15 (×21): qty 2

## 2015-07-15 MED ORDER — MAGNESIUM SULFATE 4 GM/100ML IV SOLN: 4.0000 g | Freq: Once | INTRAVENOUS | Status: DC

## 2015-07-15 MED ORDER — DIPHENHYDRAMINE HCL 50 MG/ML IJ SOLN
INTRAMUSCULAR | Status: AC
  Filled 2015-07-15: qty 1

## 2015-07-15 MED ORDER — ACETAMINOPHEN 160 MG/5ML PO SOLN: 1000.0000 mg | Freq: Four times a day (QID) | ORAL | Status: DC

## 2015-07-15 MED ORDER — MIDAZOLAM HCL 5 MG/5ML IJ SOLN
INTRAMUSCULAR | Status: DC | PRN
  Administered 2015-07-15: 2 mg via INTRAVENOUS

## 2015-07-15 MED ORDER — PROPOFOL 10 MG/ML IV BOLUS
INTRAVENOUS | Status: AC
  Filled 2015-07-15: qty 20

## 2015-07-15 MED ORDER — ALBUMIN HUMAN 5 % IV SOLN
INTRAVENOUS | Status: DC | PRN
  Administered 2015-07-15 (×4): via INTRAVENOUS

## 2015-07-15 MED ORDER — VANCOMYCIN HCL 1000 MG IV SOLR
1000.0000 mg | INTRAVENOUS | Status: DC | PRN
  Administered 2015-07-15: 1000 mg via INTRAVENOUS

## 2015-07-15 MED ORDER — SODIUM CHLORIDE 0.9 % IV SOLN
INTRAVENOUS | Status: DC
  Filled 2015-07-15: qty 40

## 2015-07-15 MED ORDER — HEPARIN SODIUM (PORCINE) 1000 UNIT/ML IJ SOLN
INTRAMUSCULAR | Status: DC | PRN
  Administered 2015-07-15 (×2): 5000 [IU] via INTRAVENOUS
  Administered 2015-07-15: 2000 [IU] via INTRAVENOUS

## 2015-07-15 MED ORDER — EPINEPHRINE HCL 0.1 MG/ML IJ SOSY
PREFILLED_SYRINGE | INTRAMUSCULAR | Status: DC | PRN
  Administered 2015-07-15: 1 ug via INTRAVENOUS

## 2015-07-15 MED ORDER — VASOPRESSIN 20 UNIT/ML IV SOLN
0.0300 [IU]/min | INTRAVENOUS | Status: AC
  Administered 2015-07-15: 0.03 [IU]/min via INTRAVENOUS
  Filled 2015-07-15: qty 2

## 2015-07-15 MED ORDER — ALBUMIN HUMAN 5 % IV SOLN
250.0000 mL | INTRAVENOUS | Status: AC | PRN
  Administered 2015-07-15 (×2): 250 mL via INTRAVENOUS

## 2015-07-15 MED ORDER — COAGULATION FACTOR VIIA RECOMB 1 MG IV SOLR
6.0000 mg | Freq: Once | INTRAVENOUS | Status: AC
  Administered 2015-07-15: 6 mg via INTRAVENOUS
  Filled 2015-07-15: qty 6

## 2015-07-15 MED ORDER — ONDANSETRON HCL 4 MG/2ML IJ SOLN
INTRAMUSCULAR | Status: AC
  Filled 2015-07-15: qty 2

## 2015-07-15 MED ORDER — SODIUM CHLORIDE 0.9 % IV SOLN
INTRAVENOUS | Status: DC
  Administered 2015-07-19: 20 mL/h via INTRAVENOUS
  Administered 2015-07-20: 20 mL via INTRAVENOUS

## 2015-07-15 MED ORDER — METOPROLOL TARTRATE 5 MG/5ML IV SOLN
INTRAVENOUS | Status: AC
  Administered 2015-07-15: 5 mg
  Filled 2015-07-15: qty 5

## 2015-07-15 MED ORDER — COAGULATION FACTOR VIIA RECOMB 1 MG IV SOLR
60.0000 ug/kg | Freq: Once | INTRAVENOUS | Status: AC
  Administered 2015-07-15 (×2): 6 mg via INTRAVENOUS
  Filled 2015-07-15: qty 6

## 2015-07-15 MED ORDER — ACETAMINOPHEN 500 MG PO TABS: 1000.0000 mg | ORAL_TABLET | Freq: Four times a day (QID) | ORAL | Status: DC

## 2015-07-15 MED ORDER — MILRINONE LACTATE IN DEXTROSE 20-5 MG/100ML-% IV SOLN
INTRAVENOUS | Status: DC | PRN
  Administered 2015-07-15: 0.375 ug/kg/min via INTRAVENOUS

## 2015-07-15 MED ORDER — DIPHENHYDRAMINE HCL 50 MG/ML IJ SOLN
INTRAMUSCULAR | Status: DC | PRN
  Administered 2015-07-15: 25 mg via INTRAVENOUS

## 2015-07-15 MED ORDER — METOPROLOL TARTRATE 12.5 MG HALF TABLET: 12.5000 mg | ORAL_TABLET | Freq: Two times a day (BID) | ORAL | Status: DC

## 2015-07-15 MED ORDER — ROCURONIUM BROMIDE 100 MG/10ML IV SOLN
INTRAVENOUS | Status: DC | PRN
  Administered 2015-07-15 (×2): 50 mg via INTRAVENOUS

## 2015-07-15 MED ORDER — NOREPINEPHRINE BITARTRATE 1 MG/ML IV SOLN
0.0000 ug/min | INTRAVENOUS | Status: AC
  Administered 2015-07-15: 4 ug/min via INTRAVENOUS
  Filled 2015-07-15: qty 4

## 2015-07-15 MED ORDER — DEXTROSE 5 % IV SOLN
4000.0000 ug | INTRAVENOUS | Status: DC | PRN
  Administered 2015-07-15: 5 ug/min via INTRAVENOUS

## 2015-07-15 MED ORDER — ONDANSETRON HCL 4 MG/2ML IJ SOLN
4.0000 mg | Freq: Four times a day (QID) | INTRAMUSCULAR | Status: DC | PRN
  Administered 2015-08-08: 4 mg via INTRAVENOUS
  Filled 2015-07-15: qty 2

## 2015-07-15 MED ORDER — LIDOCAINE IN D5W 4-5 MG/ML-% IV SOLN
2.0000 mg/min | INTRAVENOUS | Status: DC
  Filled 2015-07-15: qty 500

## 2015-07-15 MED ORDER — LIDOCAINE 2% (20 MG/ML) 5 ML SYRINGE
INTRAMUSCULAR | Status: DC | PRN
  Administered 2015-07-15 (×2): 100 mg via INTRAVENOUS

## 2015-07-15 MED ORDER — TRAMADOL HCL 50 MG PO TABS: 50.0000 mg | ORAL_TABLET | Freq: Two times a day (BID) | ORAL | Status: DC | PRN

## 2015-07-15 MED ORDER — ACETAMINOPHEN 650 MG RE SUPP
650.0000 mg | Freq: Once | RECTAL | Status: AC
  Administered 2015-07-15: 650 mg via RECTAL

## 2015-07-15 MED ORDER — SODIUM BICARBONATE 4.2 % IV SOLN
INTRAVENOUS | Status: DC | PRN
  Administered 2015-07-15: 20 mL via INTRAVENOUS

## 2015-07-15 MED ORDER — BISACODYL 5 MG PO TBEC
10.0000 mg | DELAYED_RELEASE_TABLET | Freq: Every day | ORAL | Status: DC
  Filled 2015-07-15: qty 2

## 2015-07-15 MED ORDER — OXYCODONE HCL 5 MG PO TABS: 5.0000 mg | ORAL_TABLET | ORAL | Status: DC | PRN

## 2015-07-15 MED ORDER — POTASSIUM CHLORIDE 10 MEQ/50ML IV SOLN: 10.0000 meq | INTRAVENOUS | Status: AC

## 2015-07-15 MED ORDER — METOPROLOL TARTRATE 25 MG/10 ML ORAL SUSPENSION: 12.5000 mg | Freq: Two times a day (BID) | ORAL | Status: DC

## 2015-07-15 MED ORDER — MILRINONE LACTATE IN DEXTROSE 20-5 MG/100ML-% IV SOLN
0.2500 ug/kg/min | INTRAVENOUS | Status: DC
Start: 2015-07-15 — End: 2015-07-15
  Filled 2015-07-15: qty 100

## 2015-07-15 MED ORDER — FENTANYL CITRATE (PF) 100 MCG/2ML IJ SOLN
INTRAMUSCULAR | Status: DC | PRN
  Administered 2015-07-15: 25 ug via INTRAVENOUS
  Administered 2015-07-15: 50 ug via INTRAVENOUS
  Administered 2015-07-15: 25 ug via INTRAVENOUS
  Administered 2015-07-15: 100 ug via INTRAVENOUS
  Administered 2015-07-15: 50 ug via INTRAVENOUS
  Administered 2015-07-15 (×3): 250 ug via INTRAVENOUS

## 2015-07-15 MED ORDER — EPINEPHRINE HCL 1 MG/ML IJ SOLN
0.5000 ug/min | INTRAMUSCULAR | Status: DC
  Administered 2015-07-16: 10 ug/min via INTRAVENOUS
  Administered 2015-07-16: 20 ug/min via INTRAVENOUS
  Administered 2015-07-16: 6 ug/min via INTRAVENOUS
  Administered 2015-07-17 (×3): 18.667 ug/min via INTRAVENOUS
  Administered 2015-07-17: 19.467 ug/min via INTRAVENOUS
  Administered 2015-07-17: 18.6 ug/min via INTRAVENOUS
  Administered 2015-07-17: 18.133 ug/min via INTRAVENOUS
  Administered 2015-07-17: 18.667 ug/min via INTRAVENOUS
  Administered 2015-07-18 (×3): 18.133 ug/min via INTRAVENOUS
  Administered 2015-07-18: 16.8 ug/min via INTRAVENOUS
  Administered 2015-07-18: 18.133 ug/min via INTRAVENOUS
  Administered 2015-07-19 (×4): 15 ug/min via INTRAVENOUS
  Administered 2015-07-19: 14.933 ug/min via INTRAVENOUS
  Administered 2015-07-20 – 2015-07-23 (×16): 15 ug/min via INTRAVENOUS
  Administered 2015-07-24: 10 ug/min via INTRAVENOUS
  Administered 2015-07-24: 13 ug/min via INTRAVENOUS
  Administered 2015-07-24: 15 ug/min via INTRAVENOUS
  Administered 2015-07-25: 11 ug/min via INTRAVENOUS
  Administered 2015-07-25: 15 ug/min via INTRAVENOUS
  Administered 2015-07-25: 14 ug/min via INTRAVENOUS
  Administered 2015-07-25: 12 ug/min via INTRAVENOUS
  Administered 2015-07-26 (×2): 10 ug/min via INTRAVENOUS
  Administered 2015-07-26: 11 ug/min via INTRAVENOUS
  Administered 2015-07-27: 10 ug/min via INTRAVENOUS
  Administered 2015-07-27 (×3): 12 ug/min via INTRAVENOUS
  Administered 2015-07-28: 13 ug/min via INTRAVENOUS
  Administered 2015-07-28: 10 ug/min via INTRAVENOUS
  Administered 2015-07-28: 15 ug/min via INTRAVENOUS
  Administered 2015-07-28: 12 ug/min via INTRAVENOUS
  Administered 2015-07-29: 9 ug/min via INTRAVENOUS
  Administered 2015-07-29: 10 ug/min via INTRAVENOUS
  Administered 2015-07-30: 8 ug/min via INTRAVENOUS
  Administered 2015-07-30: 6 ug/min via INTRAVENOUS
  Administered 2015-07-31: 7 ug/min via INTRAVENOUS
  Administered 2015-07-31: 8 ug/min via INTRAVENOUS
  Administered 2015-07-31 – 2015-08-02 (×6): 6 ug/min via INTRAVENOUS
  Administered 2015-08-03 – 2015-08-06 (×8): 8 ug/min via INTRAVENOUS
  Administered 2015-08-07 (×2): 15 ug/min via INTRAVENOUS
  Administered 2015-08-07: 8.267 ug/min via INTRAVENOUS
  Administered 2015-08-08 (×3): 15 ug/min via INTRAVENOUS
  Administered 2015-08-08: 14.667 ug/min via INTRAVENOUS
  Administered 2015-08-08 – 2015-08-10 (×10): 15 ug/min via INTRAVENOUS
  Filled 2015-07-15 (×108): qty 4

## 2015-07-15 MED ORDER — DEXTROSE 5 % IV SOLN
1.5000 g | Freq: Two times a day (BID) | INTRAVENOUS | Status: DC
  Administered 2015-07-16 (×2): 1.5 g via INTRAVENOUS
  Filled 2015-07-15 (×3): qty 1.5

## 2015-07-15 MED ORDER — MIDAZOLAM HCL 2 MG/2ML IJ SOLN
INTRAMUSCULAR | Status: AC
  Filled 2015-07-15: qty 2

## 2015-07-15 MED ORDER — AMINOCAPROIC ACID 250 MG/ML IV SOLN
INTRAVENOUS | Status: DC | PRN
  Administered 2015-07-15: 1 g via INTRAVENOUS

## 2015-07-15 MED ORDER — FAMOTIDINE IN NACL 20-0.9 MG/50ML-% IV SOLN: 20.0000 mg | Freq: Two times a day (BID) | INTRAVENOUS | Status: DC

## 2015-07-15 MED ORDER — ALBUMIN HUMAN 5 % IV SOLN
INTRAVENOUS | Status: DC | PRN
  Administered 2015-07-15: 23:00:00 via INTRAVENOUS

## 2015-07-15 MED ORDER — PHENYLEPHRINE HCL 10 MG/ML IJ SOLN
0.0000 ug/min | INTRAVENOUS | Status: DC
  Filled 2015-07-15: qty 1

## 2015-07-15 MED ORDER — SODIUM CHLORIDE 0.9% FLUSH: 3.0000 mL | INTRAVENOUS | Status: DC | PRN

## 2015-07-15 MED ORDER — COAGULATION FACTOR VIIA RECOMB 1 MG IV SOLR
60.0000 ug/kg | Freq: Once | INTRAVENOUS | Status: DC
  Filled 2015-07-15: qty 6

## 2015-07-15 MED ORDER — MILRINONE LACTATE IN DEXTROSE 20-5 MG/100ML-% IV SOLN
0.2500 ug/kg/min | INTRAVENOUS | Status: AC
  Administered 2015-07-15: 0.375 ug/kg/min via INTRAVENOUS
  Filled 2015-07-15: qty 100

## 2015-07-15 MED ORDER — VANCOMYCIN HCL IN DEXTROSE 1-5 GM/200ML-% IV SOLN
1000.0000 mg | Freq: Once | INTRAVENOUS | Status: DC
  Filled 2015-07-15: qty 200

## 2015-07-15 MED ORDER — PANTOPRAZOLE SODIUM 40 MG PO TBEC: 40.0000 mg | DELAYED_RELEASE_TABLET | Freq: Every day | ORAL | Status: DC

## 2015-07-15 MED ORDER — VECURONIUM BROMIDE 10 MG IV SOLR
INTRAVENOUS | Status: DC | PRN
  Administered 2015-07-15 (×2): 3 mg via INTRAVENOUS
  Administered 2015-07-15: 4 mg via INTRAVENOUS

## 2015-07-15 MED ORDER — MILRINONE LACTATE IN DEXTROSE 20-5 MG/100ML-% IV SOLN
0.3000 ug/kg/min | INTRAVENOUS | Status: DC
  Administered 2015-07-15: 0.3 ug/kg/min via INTRAVENOUS
  Administered 2015-07-16 (×3): 0.375 ug/kg/min via INTRAVENOUS
  Administered 2015-07-17: 0.125 ug/kg/min via INTRAVENOUS
  Filled 2015-07-15 (×4): qty 100

## 2015-07-15 MED ORDER — PHENYLEPHRINE HCL 10 MG/ML IJ SOLN
10.0000 mg | INTRAVENOUS | Status: DC | PRN
  Administered 2015-07-15 (×2): 20 ug/min via INTRAVENOUS
  Administered 2015-07-15: 40 ug/min via INTRAVENOUS

## 2015-07-15 MED FILL — Potassium Chloride Inj 2 mEq/ML: INTRAVENOUS | Qty: 40 | Status: AC

## 2015-07-15 MED FILL — Magnesium Sulfate Inj 50%: INTRAMUSCULAR | Qty: 10 | Status: AC

## 2015-07-15 MED FILL — Heparin Sodium (Porcine) Inj 1000 Unit/ML: INTRAMUSCULAR | Qty: 30 | Status: AC

## 2015-07-15 SURGICAL SUPPLY — 171 items
ADAPTER CARDIO PERF ANTE/RETRO (ADAPTER) IMPLANT
APPLIER CLIP 9.375 MED OPEN (MISCELLANEOUS)
APPLIER CLIP 9.375 SM OPEN (CLIP)
BAG DECANTER FOR FLEXI CONT (MISCELLANEOUS) ×3 IMPLANT
BALLOON INTRA AORTIC 34CC 8F (BALLOONS) IMPLANT
BALLOON INTRA AORTIC 40CC 8FR (BALLOONS) ×3 IMPLANT
BANDAGE ELASTIC 4 VELCRO ST LF (GAUZE/BANDAGES/DRESSINGS) ×3 IMPLANT
BANDAGE ELASTIC 6 VELCRO ST LF (GAUZE/BANDAGES/DRESSINGS) ×3 IMPLANT
BANDAGE HEMOSTAT MRDH 4X4 STRL (MISCELLANEOUS) ×2 IMPLANT
BLADE CORE FAN STRYKER (BLADE) ×6 IMPLANT
BLADE OSCILLATING /SAGITTAL (BLADE) ×3 IMPLANT
BLADE SAW SAG 29X58X.64 (BLADE) ×3 IMPLANT
BLADE STERNUM SYSTEM 6 (BLADE) ×3 IMPLANT
BLADE SURG 11 STRL SS (BLADE) ×3 IMPLANT
BLADE SURG 12 STRL SS (BLADE) ×3 IMPLANT
BLADE SURG 15 STRL LF DISP TIS (BLADE) ×4 IMPLANT
BLADE SURG 15 STRL SS (BLADE) ×2
BLADE SURG ROTATE 9660 (MISCELLANEOUS) ×3 IMPLANT
BLOOD HAEMOCONCENTR 700 MIDI (MISCELLANEOUS) ×3 IMPLANT
BNDG GAUZE ELAST 4 BULKY (GAUZE/BANDAGES/DRESSINGS) ×3 IMPLANT
BNDG HEMOSTAT MRDH 4X4 STRL (MISCELLANEOUS) ×3
CANISTER SUCTION 2500CC (MISCELLANEOUS) ×3 IMPLANT
CANNULA GRAFT 8MMX50CM (Graft) ×3 IMPLANT
CANNULA GUNDRY RCSP 15FR (MISCELLANEOUS) IMPLANT
CANNULA MALLEABLE SINGLE 40FR (CANNULA) ×3 IMPLANT
CANNULA SUMP PERICARDIAL (CANNULA) ×3 IMPLANT
CANNULA VRC MALB SNGL STG 32FR (MISCELLANEOUS) ×2 IMPLANT
CATH HEMA 3WAY 30CC 24FR COUDE (CATHETERS) ×3 IMPLANT
CATH RETROPLEGIA CORONARY 14FR (CATHETERS) IMPLANT
CATH ROBINSON RED A/P 18FR (CATHETERS) IMPLANT
CATH THORACIC 28FR (CATHETERS) ×3 IMPLANT
CATH THORACIC 28FR RT ANG (CATHETERS) ×3 IMPLANT
CATH THORACIC 36FR (CATHETERS) ×3 IMPLANT
CATH THORACIC 36FR RT ANG (CATHETERS) ×3 IMPLANT
CLIP APPLIE 9.375 MED OPEN (MISCELLANEOUS) IMPLANT
CLIP APPLIE 9.375 SM OPEN (CLIP) IMPLANT
CLIP FOGARTY SPRING 6M (CLIP) IMPLANT
CLIP TI MEDIUM 24 (CLIP) ×3 IMPLANT
CLIP TI WIDE RED SMALL 24 (CLIP) IMPLANT
CONN 1/2X1/2X1/2  BEN (MISCELLANEOUS) ×1
CONN 1/2X1/2X1/2 BEN (MISCELLANEOUS) ×2 IMPLANT
CONN 3/8X1/2 ST GISH (MISCELLANEOUS) ×3 IMPLANT
CONN 3/8X3/8 GISH STERILE (MISCELLANEOUS) ×6 IMPLANT
CONN Y 3/8X3/8X3/8  BEN (MISCELLANEOUS)
CONN Y 3/8X3/8X3/8 BEN (MISCELLANEOUS) IMPLANT
CONNECTOR 1/2X3/8X1/2 3 WAY (MISCELLANEOUS) ×1
CONNECTOR 1/2X3/8X1/2 3WAY (MISCELLANEOUS) ×2 IMPLANT
COUNTER NEEDLE 20 DBL MAG RED (NEEDLE) ×3 IMPLANT
COVER SURGICAL LIGHT HANDLE (MISCELLANEOUS) ×6 IMPLANT
CRADLE DONUT ADULT HEAD (MISCELLANEOUS) ×3 IMPLANT
DERMABOND ADVANCED (GAUZE/BANDAGES/DRESSINGS) ×1
DERMABOND ADVANCED .7 DNX12 (GAUZE/BANDAGES/DRESSINGS) ×2 IMPLANT
DRAPE CARDIOVASCULAR INCISE (DRAPES) ×1
DRAPE SLUSH/WARMER DISC (DRAPES) IMPLANT
DRAPE SRG 135X102X78XABS (DRAPES) ×2 IMPLANT
DRSG AQUACEL AG ADV 3.5X 6 (GAUZE/BANDAGES/DRESSINGS) ×3 IMPLANT
DRSG AQUACEL AG ADV 3.5X14 (GAUZE/BANDAGES/DRESSINGS) ×3 IMPLANT
ELECT BLADE 6.5 EXT (BLADE) ×3 IMPLANT
ELECT CAUTERY BLADE 6.4 (BLADE) IMPLANT
ELECT REM PT RETURN 9FT ADLT (ELECTROSURGICAL) ×6
ELECTRODE REM PT RTRN 9FT ADLT (ELECTROSURGICAL) ×4 IMPLANT
FEMORAL VENOUS CANN RAP (CANNULA) ×3 IMPLANT
GAUZE SPONGE 4X4 12PLY STRL (GAUZE/BANDAGES/DRESSINGS) ×6 IMPLANT
GAUZE XEROFORM 1X8 LF (GAUZE/BANDAGES/DRESSINGS) ×3 IMPLANT
GLOVE BIO SURGEON STRL SZ 6 (GLOVE) ×3 IMPLANT
GLOVE BIO SURGEON STRL SZ 6.5 (GLOVE) ×3 IMPLANT
GLOVE BIO SURGEON STRL SZ7 (GLOVE) ×3 IMPLANT
GLOVE BIO SURGEON STRL SZ7.5 (GLOVE) ×6 IMPLANT
GLOVE BIOGEL PI IND STRL 6 (GLOVE) ×2 IMPLANT
GLOVE BIOGEL PI IND STRL 6.5 (GLOVE) ×2 IMPLANT
GLOVE BIOGEL PI IND STRL 7.0 (GLOVE) ×2 IMPLANT
GLOVE BIOGEL PI INDICATOR 6 (GLOVE) ×1
GLOVE BIOGEL PI INDICATOR 6.5 (GLOVE) ×1
GLOVE BIOGEL PI INDICATOR 7.0 (GLOVE) ×1
GLOVE EUDERMIC 7 POWDERFREE (GLOVE) ×3 IMPLANT
GOWN STRL REUS W/ TWL LRG LVL3 (GOWN DISPOSABLE) ×8 IMPLANT
GOWN STRL REUS W/TWL LRG LVL3 (GOWN DISPOSABLE) ×4
HANDLE STAPLE ENDO GIA SHORT (STAPLE) ×1
HEMOSTAT POWDER SURGIFOAM 1G (HEMOSTASIS) IMPLANT
HEMOSTAT SURGICEL 2X14 (HEMOSTASIS) IMPLANT
HEMOSTAT SURGICEL 2X4 FIBR (HEMOSTASIS) ×3 IMPLANT
INSERT FOGARTY 61MM (MISCELLANEOUS) ×3 IMPLANT
INSERT FOGARTY XLG (MISCELLANEOUS) IMPLANT
KIT BASIN OR (CUSTOM PROCEDURE TRAY) ×3 IMPLANT
KIT DILATOR VASC 18G NDL (KITS) ×3 IMPLANT
KIT DRAINAGE VACCUM ASSIST (KITS) ×3 IMPLANT
KIT ROOM TURNOVER OR (KITS) ×3 IMPLANT
KIT SUCTION CATH 14FR (SUCTIONS) IMPLANT
KIT VASOVIEW 6 PRO VH 2400 (KITS) ×3 IMPLANT
LINE VENT (MISCELLANEOUS) ×3 IMPLANT
LOOP VESSEL MAXI BLUE (MISCELLANEOUS) ×3 IMPLANT
LOOP VESSEL SUPERMAXI WHITE (MISCELLANEOUS) ×3 IMPLANT
MARKER GRAFT CORONARY BYPASS (MISCELLANEOUS) IMPLANT
NS IRRIG 1000ML POUR BTL (IV SOLUTION) ×12 IMPLANT
PACK OPEN HEART (CUSTOM PROCEDURE TRAY) ×3 IMPLANT
PAD ARMBOARD 7.5X6 YLW CONV (MISCELLANEOUS) ×6 IMPLANT
PAD DEFIB R2 (MISCELLANEOUS) IMPLANT
PAD ELECT DEFIB RADIOL ZOLL (MISCELLANEOUS) ×3 IMPLANT
PATCH CORMATRIX 4CMX7CM (Prosthesis & Implant Heart) ×3 IMPLANT
PENCIL BUTTON HOLSTER BLD 10FT (ELECTRODE) IMPLANT
PUNCH AORTIC ROTATE  4.5MM 8IN (MISCELLANEOUS) ×3 IMPLANT
PUNCH AORTIC ROTATE 4.0MM (MISCELLANEOUS) IMPLANT
PUNCH AORTIC ROTATE 4.5MM 8IN (MISCELLANEOUS) IMPLANT
PUNCH AORTIC ROTATE 5MM 8IN (MISCELLANEOUS) IMPLANT
RELOAD ENDO GIA 30 3.5 (STAPLE) ×3 IMPLANT
SEALANT SURG COSEAL 8ML (VASCULAR PRODUCTS) ×3 IMPLANT
SENSOR MYOCARDIAL TEMP (MISCELLANEOUS) ×3 IMPLANT
SET CARDIOPLEGIA MPS 5001102 (MISCELLANEOUS) ×3 IMPLANT
SHEATH AVANTI 11CM 5FR (MISCELLANEOUS) ×6 IMPLANT
SOLUTION ANTI FOG 6CC (MISCELLANEOUS) ×3 IMPLANT
SPONGE GAUZE 4X4 12PLY STER LF (GAUZE/BANDAGES/DRESSINGS) ×6 IMPLANT
SPONGE INTESTINAL PEANUT (DISPOSABLE) ×3 IMPLANT
SPONGE LAP 18X18 X RAY DECT (DISPOSABLE) ×12 IMPLANT
SPONGE LAP 4X18 X RAY DECT (DISPOSABLE) ×3 IMPLANT
STAPLER ENDO GIA 12MM SHORT (STAPLE) ×2 IMPLANT
STAPLER VISISTAT 35W (STAPLE) ×6 IMPLANT
STOPCOCK 4 WAY LG BORE MALE ST (IV SETS) ×3 IMPLANT
SURGIFLO W/THROMBIN 8M KIT (HEMOSTASIS) ×6 IMPLANT
SUT BONE WAX W31G (SUTURE) ×3 IMPLANT
SUT ETHIBOND 2 0 SH (SUTURE) ×1
SUT ETHIBOND 2 0 SH 36X2 (SUTURE) ×2 IMPLANT
SUT ETHIBOND NAB MH 2-0 36IN (SUTURE) ×21 IMPLANT
SUT MNCRL AB 4-0 PS2 18 (SUTURE) ×6 IMPLANT
SUT PROLENE 3 0 SH 1 (SUTURE) IMPLANT
SUT PROLENE 3 0 SH 48 (SUTURE) ×6 IMPLANT
SUT PROLENE 3 0 SH DA (SUTURE) ×12 IMPLANT
SUT PROLENE 3 0 SH1 36 (SUTURE) ×3 IMPLANT
SUT PROLENE 4 0 RB 1 (SUTURE) ×12
SUT PROLENE 4 0 SH DA (SUTURE) ×51 IMPLANT
SUT PROLENE 4-0 RB1 .5 CRCL 36 (SUTURE) ×24 IMPLANT
SUT PROLENE 5 0 C 1 36 (SUTURE) ×27 IMPLANT
SUT PROLENE 5 0 CC1 (SUTURE) ×6 IMPLANT
SUT PROLENE 6 0 C 1 30 (SUTURE) ×42 IMPLANT
SUT PROLENE 6 0 CC (SUTURE) ×42 IMPLANT
SUT PROLENE 7 0 BV 1 (SUTURE) ×3 IMPLANT
SUT PROLENE 7 0 BV1 MDA (SUTURE) ×3 IMPLANT
SUT PROLENE 7 0 DA (SUTURE) IMPLANT
SUT PROLENE 8 0 BV175 6 (SUTURE) IMPLANT
SUT PROLENE BLUE 7 0 (SUTURE) ×9 IMPLANT
SUT PROLENE POLY MONO (SUTURE) ×3 IMPLANT
SUT SILK  1 MH (SUTURE) ×3
SUT SILK 1 MH (SUTURE) ×6 IMPLANT
SUT SILK 2 0 SH CR/8 (SUTURE) ×9 IMPLANT
SUT SILK 3 0 SH CR/8 (SUTURE) ×3 IMPLANT
SUT STEEL 6MS V (SUTURE) IMPLANT
SUT STEEL STERNAL CCS#1 18IN (SUTURE) IMPLANT
SUT STEEL SZ 6 DBL 3X14 BALL (SUTURE) IMPLANT
SUT VIC AB 1 CTX 18 (SUTURE) ×3 IMPLANT
SUT VIC AB 1 CTX 36 (SUTURE) ×2
SUT VIC AB 1 CTX36XBRD ANBCTR (SUTURE) ×4 IMPLANT
SUT VIC AB 2-0 CT1 27 (SUTURE) ×1
SUT VIC AB 2-0 CT1 TAPERPNT 27 (SUTURE) ×2 IMPLANT
SUT VIC AB 2-0 CTX 27 (SUTURE) ×3 IMPLANT
SUT VIC AB 3-0 SH 27 (SUTURE) ×1
SUT VIC AB 3-0 SH 27X BRD (SUTURE) ×2 IMPLANT
SUT VIC AB 3-0 X1 27 (SUTURE) ×3 IMPLANT
SUTURE E-PAK OPEN HEART (SUTURE) ×3 IMPLANT
SYR 50ML SLIP (SYRINGE) ×3 IMPLANT
SYSTEM SAHARA CHEST DRAIN ATS (WOUND CARE) ×3 IMPLANT
TAPE CLOTH SURG 4X10 WHT LF (GAUZE/BANDAGES/DRESSINGS) ×3 IMPLANT
TOWEL OR 17X24 6PK STRL BLUE (TOWEL DISPOSABLE) ×3 IMPLANT
TOWEL OR 17X26 10 PK STRL BLUE (TOWEL DISPOSABLE) ×3 IMPLANT
TRAY FOLEY IC TEMP SENS 16FR (CATHETERS) ×3 IMPLANT
TUBING ART PRESS 48 MALE/FEM (TUBING) ×6 IMPLANT
TUBING INSUFFLATION (TUBING) ×3 IMPLANT
UNDERPAD 30X30 INCONTINENT (UNDERPADS AND DIAPERS) ×3 IMPLANT
VEIN SAPHENOUS CRYO  61-70CM (Tissue) ×1 IMPLANT
VEIN SAPHENOUS CRYO 61-70CM (Tissue) ×2 IMPLANT
VRC MALLEABLE SINGLE STG 32FR (MISCELLANEOUS) ×3
WATER STERILE IRR 1000ML POUR (IV SOLUTION) ×6 IMPLANT
YANKAUER SUCT BULB TIP NO VENT (SUCTIONS) ×3 IMPLANT

## 2015-07-15 SURGICAL SUPPLY — 93 items
ADAPTER CARDIO PERF ANTE/RETRO (ADAPTER) ×3 IMPLANT
BAG DECANTER FOR FLEXI CONT (MISCELLANEOUS) ×3 IMPLANT
BANDAGE ELASTIC 4 VELCRO ST LF (GAUZE/BANDAGES/DRESSINGS) ×3 IMPLANT
BANDAGE ELASTIC 6 VELCRO ST LF (GAUZE/BANDAGES/DRESSINGS) ×3 IMPLANT
BASKET HEART  (ORDER IN 25'S) (MISCELLANEOUS) ×1
BASKET HEART (ORDER IN 25'S) (MISCELLANEOUS) ×1
BASKET HEART (ORDER IN 25S) (MISCELLANEOUS) ×1 IMPLANT
BLADE STERNUM SYSTEM 6 (BLADE) ×3 IMPLANT
BLADE SURG 12 STRL SS (BLADE) ×3 IMPLANT
BLADE SURG ROTATE 9660 (MISCELLANEOUS) IMPLANT
BNDG GAUZE ELAST 4 BULKY (GAUZE/BANDAGES/DRESSINGS) ×3 IMPLANT
CANISTER SUCTION 2500CC (MISCELLANEOUS) ×3 IMPLANT
CANNULA GUNDRY RCSP 15FR (MISCELLANEOUS) ×3 IMPLANT
CATH CPB KIT VANTRIGT (MISCELLANEOUS) ×3 IMPLANT
CATH ROBINSON RED A/P 18FR (CATHETERS) ×9 IMPLANT
CATH THORACIC 36FR RT ANG (CATHETERS) ×6 IMPLANT
COVER SURGICAL LIGHT HANDLE (MISCELLANEOUS) ×3 IMPLANT
CRADLE DONUT ADULT HEAD (MISCELLANEOUS) ×3 IMPLANT
DRAIN CHANNEL 32F RND 10.7 FF (WOUND CARE) ×6 IMPLANT
DRAPE CARDIOVASCULAR INCISE (DRAPES) ×2
DRAPE SLUSH/WARMER DISC (DRAPES) ×3 IMPLANT
DRAPE SRG 135X102X78XABS (DRAPES) ×1 IMPLANT
DRESSING AQUACEL AG SP 3.5X10 (GAUZE/BANDAGES/DRESSINGS) ×1 IMPLANT
DRSG AQUACEL AG ADV 3.5X14 (GAUZE/BANDAGES/DRESSINGS) ×3 IMPLANT
DRSG AQUACEL AG SP 3.5X10 (GAUZE/BANDAGES/DRESSINGS) ×3
ELECT BLADE 4.0 EZ CLEAN MEGAD (MISCELLANEOUS) ×3
ELECT BLADE 6.5 EXT (BLADE) ×3 IMPLANT
ELECT CAUTERY BLADE 6.4 (BLADE) ×3 IMPLANT
ELECT REM PT RETURN 9FT ADLT (ELECTROSURGICAL) ×6
ELECTRODE BLDE 4.0 EZ CLN MEGD (MISCELLANEOUS) ×1 IMPLANT
ELECTRODE REM PT RTRN 9FT ADLT (ELECTROSURGICAL) ×2 IMPLANT
FELT TEFLON 1X6 (MISCELLANEOUS) ×3 IMPLANT
GAUZE SPONGE 4X4 12PLY STRL (GAUZE/BANDAGES/DRESSINGS) ×6 IMPLANT
GLOVE BIO SURGEON STRL SZ7.5 (GLOVE) ×9 IMPLANT
GOWN STRL REUS W/ TWL LRG LVL3 (GOWN DISPOSABLE) ×4 IMPLANT
GOWN STRL REUS W/TWL LRG LVL3 (GOWN DISPOSABLE) ×8
HEMOSTAT POWDER SURGIFOAM 1G (HEMOSTASIS) ×9 IMPLANT
HEMOSTAT SURGICEL 2X14 (HEMOSTASIS) ×3 IMPLANT
INSERT FOGARTY XLG (MISCELLANEOUS) IMPLANT
KIT BASIN OR (CUSTOM PROCEDURE TRAY) ×3 IMPLANT
KIT REMOVER STAPLE SKIN (MISCELLANEOUS) ×3 IMPLANT
KIT ROOM TURNOVER OR (KITS) ×3 IMPLANT
KIT SUCTION CATH 14FR (SUCTIONS) ×3 IMPLANT
KIT VASOVIEW 6 PRO VH 2400 (KITS) ×3 IMPLANT
LEAD PACING MYOCARDI (MISCELLANEOUS) ×3 IMPLANT
MARKER GRAFT CORONARY BYPASS (MISCELLANEOUS) ×9 IMPLANT
NS IRRIG 1000ML POUR BTL (IV SOLUTION) ×15 IMPLANT
PACK OPEN HEART (CUSTOM PROCEDURE TRAY) ×3 IMPLANT
PAD ARMBOARD 7.5X6 YLW CONV (MISCELLANEOUS) ×6 IMPLANT
PAD ELECT DEFIB RADIOL ZOLL (MISCELLANEOUS) ×3 IMPLANT
PENCIL BUTTON HOLSTER BLD 10FT (ELECTRODE) ×3 IMPLANT
PUNCH AORTIC ROTATE 4.0MM (MISCELLANEOUS) IMPLANT
PUNCH AORTIC ROTATE 4.5MM 8IN (MISCELLANEOUS) IMPLANT
PUNCH AORTIC ROTATE 5MM 8IN (MISCELLANEOUS) IMPLANT
SPONGE GAUZE 4X4 12PLY STER LF (GAUZE/BANDAGES/DRESSINGS) ×3 IMPLANT
STAPLER VISISTAT 35W (STAPLE) ×3 IMPLANT
SURGIFLO W/THROMBIN 8M KIT (HEMOSTASIS) ×3 IMPLANT
SUT BONE WAX W31G (SUTURE) ×6 IMPLANT
SUT MNCRL AB 4-0 PS2 18 (SUTURE) IMPLANT
SUT PROLENE 3 0 SH DA (SUTURE) ×3 IMPLANT
SUT PROLENE 3 0 SH1 36 (SUTURE) IMPLANT
SUT PROLENE 4 0 RB 1 (SUTURE) ×4
SUT PROLENE 4 0 SH DA (SUTURE) ×18 IMPLANT
SUT PROLENE 4-0 RB1 .5 CRCL 36 (SUTURE) ×2 IMPLANT
SUT PROLENE 5 0 C 1 36 (SUTURE) ×3 IMPLANT
SUT PROLENE 6 0 C 1 30 (SUTURE) ×6 IMPLANT
SUT PROLENE 6 0 CC (SUTURE) ×9 IMPLANT
SUT PROLENE 8 0 BV175 6 (SUTURE) IMPLANT
SUT PROLENE BLUE 7 0 (SUTURE) ×3 IMPLANT
SUT SILK  1 MH (SUTURE) ×4
SUT SILK 1 MH (SUTURE) ×2 IMPLANT
SUT SILK 1 TIES 10X30 (SUTURE) ×3 IMPLANT
SUT SILK 2 0 SH CR/8 (SUTURE) ×6 IMPLANT
SUT SILK 2 0 TIES 10X30 (SUTURE) ×3 IMPLANT
SUT SILK 3 0 SH CR/8 (SUTURE) ×3 IMPLANT
SUT STEEL 6MS V (SUTURE) ×6 IMPLANT
SUT STEEL SZ 6 DBL 3X14 BALL (SUTURE) ×3 IMPLANT
SUT VIC AB 1 CTX 18 (SUTURE) ×6 IMPLANT
SUT VIC AB 1 CTX 36 (SUTURE) ×6
SUT VIC AB 1 CTX36XBRD ANBCTR (SUTURE) ×3 IMPLANT
SUT VIC AB 2-0 CT1 27 (SUTURE)
SUT VIC AB 2-0 CT1 TAPERPNT 27 (SUTURE) IMPLANT
SUT VIC AB 2-0 CTX 27 (SUTURE) ×6 IMPLANT
SUT VIC AB 3-0 X1 27 (SUTURE) IMPLANT
SUTURE E-PAK OPEN HEART (SUTURE) ×3 IMPLANT
SYSTEM SAHARA CHEST DRAIN ATS (WOUND CARE) ×3 IMPLANT
TAPE CLOTH SURG 4X10 WHT LF (GAUZE/BANDAGES/DRESSINGS) ×3 IMPLANT
TOWEL OR 17X24 6PK STRL BLUE (TOWEL DISPOSABLE) ×6 IMPLANT
TOWEL OR 17X26 10 PK STRL BLUE (TOWEL DISPOSABLE) ×6 IMPLANT
TRAY FOLEY IC TEMP SENS 16FR (CATHETERS) ×3 IMPLANT
TUBING INSUFFLATION (TUBING) ×3 IMPLANT
UNDERPAD 30X30 INCONTINENT (UNDERPADS AND DIAPERS) ×3 IMPLANT
WATER STERILE IRR 1000ML POUR (IV SOLUTION) ×6 IMPLANT

## 2015-07-15 NOTE — Significant Event (Addendum)
Rapid Response Event Note  Overview:  Time Called: 0010 Arrival Time: 0015 Event Type: Cardiac  Initial Focused Assessment: Bedside RN called to assess patient, chest pain was back at 10/10, upon arrival to see patient, Dr. Aundra Dubin present outside patient's room.  Patient was clenching his chest, stated his pain came back and it was intense.  SBP in the 140s, patient was on 140mcg of Nitroglycerin.   Interventions: - Administered given 5mg  IV Metoprolol and 1mg  IV Dilaudid - Patient became hypotensive SBP in the 80s and patient was lightheaded - NITRO drip paused and patient given 250cc NS Bolus, BP halfway through bolus was 96/53 (64), after bolus 113/62 (74). - NITRO drip restarted at 35mcg for chest pain 2/10 at 0050, titrated to 179mcg for dull chest pain (3/10), repeat BP at that time was      147/75 (93). - 0105, NITRO drip at 117mcg, chest pain is 0/10 per patient, VS: BP 119/54 (69), HR 70, 96% on 2L [place], RR 14   Event Summary: Name of Physician Notified: Dr. Aundra Dubin at      at  [place] in room and stabalized     Analucia Hush R

## 2015-07-15 NOTE — Anesthesia Procedure Notes (Signed)
Central Venous Catheter Insertion Performed by: anesthesiologist Patient location: Pre-op. Preanesthetic checklist: patient identified, IV checked, site marked, risks and benefits discussed, surgical consent, monitors and equipment checked, pre-op evaluation, timeout performed and anesthesia consent Position: Trendelenburg Lidocaine 1% used for infiltration Landmarks identified Catheter size: 8.5 Fr PA cath was placed.Sheath introducer Swan type and PA catheter depth:thermodilationProcedure performed using ultrasound guided technique. Attempts: 1 Following insertion, line sutured, dressing applied and Biopatch. Post procedure assessment: blood return through all ports. Patient tolerated the procedure well with no immediate complications. Additional procedure comments: RIJ cannulated x 3, unable to pass guidewire. Signigicant plaque visible on [place] in [place].   Left side prepped. Cannulated easily, guidewire passed without difficulty. [name]

## 2015-07-15 NOTE — Progress Notes (Signed)
CRITICAL VALUE ALERT  Critical value received:  HGB 5.1  Date of notification:  08/05/2015  Time of notification:  1930  Critical value read back:Yes.    Nurse who received alert:  Tommy Rainwater Rn  MD notified (1st page):  Owen/Van Tright  Time of first page:  1932  MD notified (2nd page): NA  Time of second page: NA  Responding MD:  Owen/ Lucianne Lei Tright Orders received for 4 PRBC 1 PLT 2 FFP  Time MD responded:  1932

## 2015-07-15 NOTE — Progress Notes (Addendum)
CT Surgery  Patient had 500 cc chest tube drainage last hour,up from 250 cc/hr. Will  Take patient back to OR for re-exploration of bleeding. Will notify family  Daughter April notified regarding return to [place]

## 2015-07-15 NOTE — Anesthesia Preprocedure Evaluation (Addendum)
Anesthesia Evaluation  Patient identified by MRN, date of birth, ID band Patient awake    Reviewed: Allergy & Precautions, NPO status , Patient's Chart, lab work & pertinent test results  Airway Mallampati: II  TM Distance: >3 FB Neck ROM: Full    Dental   Pulmonary former smoker,    breath sounds clear to auscultation       Cardiovascular + angina + CAD, + Past MI and +CHF   Rhythm:Regular Rate:Normal     Neuro/Psych    GI/Hepatic Neg liver ROS, GERD  ,  Endo/Other  diabetes  Renal/GU Renal disease     Musculoskeletal   Abdominal   Peds  Hematology   Anesthesia Other Findings   Reproductive/Obstetrics                            Anesthesia Physical Anesthesia Plan  ASA: IV  Anesthesia Plan: General   Post-op Pain Management:    Induction: Intravenous  Airway Management Planned: Oral ETT  Additional Equipment: Arterial line, PA Cath, TEE and Ultrasound Guidance Line Placement  Intra-op Plan:   Post-operative Plan: Post-operative intubation/ventilation  Informed Consent: I have reviewed the patients History and Physical, chart, labs and discussed the procedure including the risks, benefits and alternatives for the proposed anesthesia with the patient or authorized representative who has indicated his/her understanding and acceptance.   Dental advisory given  Plan Discussed with: CRNA and Anesthesiologist  Anesthesia Plan Comments:         Anesthesia Quick Evaluation

## 2015-07-15 NOTE — Progress Notes (Signed)
RT NOTE:  Pt removed from Vent @ [id number]. Pt going back to OR. CRNA manually bagging patient w/ PEEP valve.

## 2015-07-15 NOTE — Transfer of Care (Signed)
Immediate Anesthesia Transfer of Care Note  Patient: Joseph [name]  Procedure(s) Performed: Procedure(s): REDO CORONARY ARTERY BYPASS GRAFTING (CABG) times two using right greater saphenous vein harvested by endovein, cryovein saphenous vein implant  (N/A) TRANSESOPHAGEAL ECHOCARDIOGRAM (TEE) (N/A)  Patient Location: SICU  Anesthesia Type:General  Level of Consciousness: Patient remains intubated per anesthesia plan  Airway & Oxygen Therapy: Patient remains intubated per anesthesia plan and Patient placed on Ventilator (see vital sign flow sheet for setting)  Post-op Assessment: Post -op Vital signs reviewed and unstable, Anesthesiologist notified  Post vital signs: unstable  Last Vitals:  Filed Vitals:   07/18/2015 0050 07/12/2015 0507  BP: 113/62 121/65  Pulse:  66  Temp:  36.6 C  Resp:  16    Last Pain:  Filed Vitals:   08/03/2015 0517  PainSc: 0-No pain      Patients Stated Pain Goal: 0 ([id number])  Complications: cardiovascular complications

## 2015-07-15 NOTE — Progress Notes (Addendum)
[facility] BRIEF SICU PROGRESS NOTE  Day of Surgery  S/P Procedure(s) (LRB): REDO CORONARY ARTERY BYPASS GRAFTING (CABG) times two using right greater saphenous vein harvested by endovein, cryovein saphenous vein implant  (N/A) TRANSESOPHAGEAL ECHOCARDIOGRAM (TEE) (N/A)   Just arrived in SICU temp 35.1 AV paced w/ MAP 74 w/ PA pressures relatively low on multiple inotropes and IABP 1:1 O2 sats 100% on 94% FiO2 Total 540 mL in Pleur-evac Hgb 9.2 platelet count 59k fibrinogen 148 INR 1.5 aPTT 105 ABG 7.29/46/230/22  Plan: Correct coags, warm patient, replace blood loss and monitor closely  Rexene Alberts, MD 08/04/2015 7:08 PM

## 2015-07-15 NOTE — Anesthesia Postprocedure Evaluation (Signed)
Anesthesia Post Note  Patient: Joseph [name]  Procedure(s) Performed: Procedure(s) (LRB): REDO CORONARY ARTERY BYPASS GRAFTING (CABG) times two using right greater saphenous vein harvested by endovein, cryovein saphenous vein implant  (N/A) TRANSESOPHAGEAL ECHOCARDIOGRAM (TEE) (N/A)  Patient location during evaluation: ICU Anesthesia Type: General Level of consciousness: sedated and patient remains intubated per anesthesia plan Pain management: pain level controlled Vital Signs Assessment: vitals unstable Respiratory status: patient remains intubated per anesthesia plan, respiratory function unstable and patient on ventilator - see flowsheet for VS Cardiovascular status: unstable Anesthetic complications: no    Last Vitals:  Filed Vitals:   07/09/2015 0050 08/03/2015 0507  BP: 113/62 121/65  Pulse:  66  Temp:  36.6 C  Resp:  16    Last Pain:  Filed Vitals:   07/09/2015 0517  PainSc: 0-No pain                 Chatham Howington EDWARD

## 2015-07-15 NOTE — Progress Notes (Signed)
[facility] Rounding Note  Subjective/Events  Just back from redo CABG.  Long procedure/large amt blood products 14 PRBC's/2 plts/4 cryo/6 FFP/cellsaver Balloon pump Chest left open  Objective  Weight change: -2.2 kg (-4 lb 13.6 oz)  Intake/Output Summary (Last 24 hours) at 08/03/2015 1851 Last data filed at 08/01/2015 1811  Gross per 24 hour  Intake 20431.68 ml  Output  21280 ml  Net -848.32 ml   Physical Exam:  Generalized edema Gen: ETT/balloon pump L groin/SGC/OGT/chest tubesX3/l high vol infusion line L groin CVS: AV paced. Chest not closed. Resp: Ant clear Abd: Dist/quiet Ext: IABP L groin/+1 edema. R leg dsg over  saph v harvest site L FA AVF + bruit NEURO: Intubated, sedated     Recent Labs Lab 07/10/15 0736 07/10/15 1209 07/13/15 0650 07/30/2015 0421  07/21/2015 0929 07/23/2015 1034 07/26/2015 1106 07/23/2015 1233 07/20/2015 1352 07/20/2015 1507 07/10/2015 1627  NA 135 135 136 137  < > 139 149* 138 138 135 138 141  K 4.5 3.5 4.3 3.9  < > 4.0 2.5* 4.3 5.1 5.3* 4.8 4.0  CL 101 96* 103 100*  < > 98* 116* 101 100* 97* 100* 99*  CO2 23 29 23 30   --   --   --   --   --   --   --   --   GLUCOSE 111* 222* 113* 188*  < > 160* 134* 263* 291* 281* 282* 273*  BUN 48* 18 61* 24*  < > 28* 14 26* 28* 27* 27* 28*  CREATININE 6.19* 3.32* 6.63* 4.24*  < > 4.20* 2.00* 3.20* 3.30* 3.20* 3.10* 3.00*  CALCIUM 8.2* 8.1* 8.5* 8.8*  --   --   --   --   --   --   --   --   PHOS 6.1* 2.8 5.6*  --   --   --   --   --   --   --   --   --   < > = values in this interval not displayed.   Recent Labs Lab 07/10/15 1209 07/13/15 0650 07/14/15 0927  AST  --   --  28  ALT  --   --  26  ALKPHOS  --   --  74  BILITOT  --   --  0.9  PROT  --   --  7.7  ALBUMIN 3.7 3.4* 3.7     Recent Labs Lab 07/12/15 0430 07/13/15 0650 07/14/15 0401 07/25/2015 0421  07/24/2015 1352 08/02/2015 1501 07/30/2015 1507 07/30/2015 1627 07/28/2015 1723  WBC 5.6 4.9 5.0 7.5  --   --   --   --   --   --   HGB 9.9* 9.3* 9.8*  8.8*  < > 8.5* 11.4* 9.9* 9.2*  --   HCT 31.8* 29.0* 31.0* 28.0*  < > 25.0* 32.6* 29.0* 27.0*  --   MCV 93.3 92.4 93.1 94.3  --   --   --   --   --   --   PLT 58* 56* 66* 70*  --   --  12*  --   --  59*  < > = values in this interval not displayed.   Recent Labs Lab 07/12/15 2151 07/13/15 0040 07/13/15 0650 07/14/15 2330  TROPONINI 0.06* 0.07* 0.08* 0.15*   ABG    Component Value Date/Time   PHART 7.132* 08/03/2015 1915   PCO2ART 62.5* 07/13/2015 1915   PO2ART 59.0* 07/22/2015 1915   HCO3 20.9 07/14/2015  1915   TCO2 23 07/10/2015 1915   ACIDBASEDEF 8.0* 07/21/2015 1915   O2SAT 80.0 07/24/2015 1915     Recent Labs Lab 07/13/15 1136 07/13/15 1951 07/14/15 0722 07/14/15 1122 07/14/15 2003  GLUCAP 164* 106* 117* 155* 218*   Dg Chest Portable 1 View  07/23/2015  CLINICAL DATA:  70 year old male status post CABG EXAM: PORTABLE CHEST 1 VIEW COMPARISON:  Chest radiograph dated 07/14/2015 FINDINGS: Single portable view of the chest. An endotracheal tube is noted with tip approximately 6.5 cm above the carina. Bilateral chest tubes seen the tip of the right chest tube is superimposed over the right posterior sixth rib and the tip of the left chest tube is located in the left upper lung field superimposed over the left posterior fifth rib. A third chest tube is seen at the level of the left hemidiaphragm. A Swan-Ganz catheter with tip at the level of the pulmonary artery. An inferiorly placed catheter extend upward with tip superimposed over the descending thoracic aorta. This may represent an aortic balloon pump. A scope is seen over the mediastinum. There patchy areas of opacity predominantly involving the left lung which may represent atelectatic changes or infiltrate. There is diffuse increased interstitial markings on the right without focal consolidation. Trace pleural effusion may be present on the right. The left costophrenic angle has been excluded from the image. There is no  pneumothorax. There is mild enlargement of the cardiopericardial silhouette. Multiple surgical clips noted in the soft tissues of the right upper chest wall. No acute osseous pathology identified. IMPRESSION: Support lines and tubes as described. Airspace opacity predominantly involving the left lung are more pronounced in the left mid and lower lung field may represent atelectasis versus infiltrate. Clinical correlation and follow-up recommended. No pneumothorax. Mild cardiopericardial enlargement. Electronically Signed   By: Anner Crete M.D.   On: 07/14/2015 19:03    Dg Chest 2 View  07/14/2015  CLINICAL DATA:  Preoperative assessment for CABG, history coronary disease post prior CABG, former smoker, hypertensive heart disease, prostate cancer, insulin-dependent diabetes mellitus, ischemic cardiomyopathy, chronic combined systolic and diastolic CHF, end-stage renal disease, former smoker EXAM: CHEST  2 VIEW COMPARISON:  07/01/2015 FINDINGS: Enlargement of cardiac silhouette post CABG. Mediastinal contours and pulmonary vascularity normal. Lungs clear. No pulmonary infiltrate, pleural effusion or pneumothorax. Degenerative disc disease changes thoracic spine without acute bony abnormalities. IMPRESSION: Enlargement of cardiac silhouette post CABG. No acute abnormalities. Electronically Signed   By: Lavonia Dana M.D.   On: 07/14/2015 10:00   Medications: . sodium chloride   Intravenous Once  . [START ON 07/16/2015] acetaminophen  1,000 mg Oral Q6H   Or  . [START ON 07/16/2015] acetaminophen (TYLENOL) oral liquid 160 mg/5 mL  1,000 mg Per Tube Q6H  . [START ON 07/16/2015] bisacodyl  10 mg Oral Daily   Or  . [START ON 07/16/2015] bisacodyl  10 mg Rectal Daily  . calcium acetate  1,334 mg Oral TID WC  . cefUROXime (ZINACEF)  IV  1.5 g Intravenous Q12H  . chlorhexidine  15 mL Mouth/Throat NOW  . coagulation factor VIIa recomb  60 mcg/kg Intravenous Once  . darbepoetin (ARANESP) injection - DIALYSIS  60 mcg  Intravenous Q Tue-HD  . [START ON 07/16/2015] docusate sodium  200 mg Oral Daily  . famotidine (PEPCID) IV  20 mg Intravenous Q12H  . [START ON 07/16/2015] insulin regular  0-10 Units Intravenous TID WC  . magic mouthwash  10 mL Oral TID  . magnesium sulfate  4 g Intravenous Once  . metoprolol tartrate  12.5 mg Oral BID   Or  . metoprolol tartrate  12.5 mg Per Tube BID  . multivitamin  1 tablet Oral QHS  . [START ON 07/17/2015] pantoprazole  40 mg Oral Daily  . potassium chloride  10 mEq Intravenous Q1 Hr x 3  . pregabalin  75 mg Oral BID  . rosuvastatin  20 mg Oral QHS  . [START ON 07/16/2015] sodium chloride flush  3 mL Intravenous Q12H  . vancomycin  1,000 mg Intravenous Once   Infusions . sodium chloride    . [START ON 07/16/2015] sodium chloride    . sodium chloride    . dexmedetomidine    . DOPamine    . EPINEPHrine 4 mg in dextrose 5% 250 mL infusion (16 mcg/mL)    . insulin (NOVOLIN-R) infusion    . lactated ringers    . lactated ringers    . lidocaine    . milrinone    . nitroGLYCERIN    . norepinephrine (LEVOPHED) Adult infusion    . phenylephrine (NEO-SYNEPHRINE) Adult infusion     Assessment/Recs  1. CAD/NSTEMI/Redo CABG X2  2. ESRD - Usual TTS. Had HD 6/6. 6/7. K fine; getting bicarb for acidosis; spoke w/Dr. Prescott Gum - no emerging dialysis needs right now. If pressor requirements continue over next 24 hours may need CRRT (in which case would need line).  3. Mixed resp/metabolic acidosis - bicarb/vent support 4. Anemia/thrombocytopenia  - plts/PRBC's per Dr. Darcey Nora 5. Coagulopathy - FFP/cryo 6. DM   Jamal Maes, MD [facility] [phone] pager 07/18/2015, 6:51 PM

## 2015-07-15 NOTE — Progress Notes (Signed)
Dr Nils Pyle notified of critical CBC, ABG, and increasing CT output of 264ml in 30 mintues. Orders received for 4 PRBC, 2 FFP, 1 PLT, repeat CBC after 2nd unit, increase peep to 8 and RR to 20. Will continue to monitor closely. Eleonore Chiquito RN

## 2015-07-15 NOTE — Progress Notes (Signed)
The patient was examined and preop studies reviewed. There has been no change from the prior exam and the patient is ready for surgery.   Plan redo CABG on [place]

## 2015-07-15 NOTE — Progress Notes (Signed)
Patient ID: NEVEN FICARA, male   DOB: 1945/09/11, 70 y.o.   MRN: [id number]  Called by nurse for recurrent chest pain.  He has had on and off this evening.  We started NTG gtt, gave Fentanyl and metoprolol.  He was pain-free for about an hour, now worse again.  SBP in 150s with HR in 90s.  ECG shows inferior STE and lateral deep TWIs, looks fairly similar to prior ECG.  - Metoprolol 5 mg IV again, increase NTG gtt to 150 mcg/min, give Dilaudid 1 mg IV and can repeat.   - Send troponin.  - Based on prior anatomy, PCI would not be optimal.  - Plan for CABG at 6:30 am.    Loralie Champagne 07/31/2015 12:20 AM

## 2015-07-15 NOTE — Brief Op Note (Signed)
07/06/2015 - 07/23/2015  3:04 PM  PATIENT:  Joseph [name]  70 y.o. male  PRE-OPERATIVE DIAGNOSIS:  CAD  POST-OPERATIVE DIAGNOSIS:  CAD  PROCEDURE:  TRANSESOPHAGEAL ECHOCARDIOGRAM (TEE), RIGHT AXILLARY AND FEMORAL CANNULATION for REDO STERNOTOMY for CORONARY ARTERY BYPASS GRAFTING (CABG) x 2 (SVG to DIAGONAL and SVG to OM) with EVH of the greater saphenous vein from partial right thigh and lower leg, IABP on left, Left FEMORAL CENTRAL LINE   SURGEON:  Surgeon(s) and Role:    * Ivin Poot, MD - Primary  PHYSICIAN ASSISTANT: Lars Pinks PA-C  ASSISTANTS: Ara Kussmaul RNFA  ANESTHESIA:   general  EBL:  Total I/O In: [id number] [I.V.:3100; Blood:5578] Out: 80 [Urine:80]  BLOOD ADMINISTERED:Pre pump: 9 PRBC, 1 FFP, 2400 cc CELL SAVER; On pump: 6 PRBC, 1 ffp, 3084 CELL SAVER; Additional 20 CRYO, 3 PLTS, and 3 FFP   DRAINS: Chest tubes placed in the mediastinal and pleural spaces   COUNTS CORRECT:  YES  DICTATION: .Dragon Dictation  PLAN OF CARE: Admit to inpatient   PATIENT DISPOSITION:  ICU - intubated and hemodynamically stable.   Delay start of Pharmacological VTE agent (>24hrs) due to surgical blood loss or risk of bleeding: yes  BASELINE WEIGHT: 93 kg

## 2015-07-15 NOTE — Progress Notes (Signed)
Pt c/o CP for the third time (10/10).  First time one SL NTG given, CP down to 1/10 from 7/10.  Second time 2 SL ntg given.  Third time I gave one more SL NTG and started IV Ntg gtt at 39mcg/min, per MD order.  EKG obtained.  Ntg gtt infused for about 40 min when the next episode CP started and within 5 min, pt c/o 10/10 CP.     Rapid Response RN called to bedside.  Dr. Aundra Dubin aware and orders obtained.  Pt placed on NRB, Ntg titrated up to 177mcg/min, total of 120mcg Fentanyl IV and 5mg  metoprolol IV given.  CP completely resolved and pt was placed back on 2L O2 nasal canula.   Will monitor closely. Oncoming RN updated on pt status.

## 2015-07-15 NOTE — Progress Notes (Signed)
Emergent release transfusion's  PRBC unit # [id number] : Checked by Eleonore Chiquito RN/ Dustin Folks RN. Started at 2029 and ended at 2039. 359ml infused.  PRBC unit# [id number] : Checked by Eleonore Chiquito RN/ Dustin Folks RN. Started at 2020 and ended at 2035 346ml infused  PRBC unit # [id number] : Checked by Dustin Folks RN/ Loman Chroman RN. Started at 2020 and ended 2036. 352ml infused  CRYO unit # [id number] : Checked by Dustin Folks RN/ Loman Chroman RN. Started at 2003 and ended at 2015. 1106ml infused  CRYO unit # [id number] : Checked by Dustin Folks RN/ Eleonore Chiquito RN. Started at 2010 and ended at 2015. 81ml infused.  THAWED PLASMA unit # [id number] : Checked by Wyline Beady RN/ Eleonore Chiquito RN. Started at 2110 and ended at 2120. 210ml infused.  THAWED PLASMA unit # [id number] : Checked by Cassell Smiles RN/ Dustin Folks RN. Started 2055 and ended 2115. 380ml infused  THAWED PLASMA unit # [id number] : Checked by Eleonore Chiquito RN/ Hyu Kiser RN. Started at 15 and ended at 1955. 266ml infused.  Thawed Plasma unit # [id number] : Checked by Jake Bathe RN/ Cassell Smiles RN. Started at 53 and ended at [place]. 351ml infused  PLT Unit # [id number] : Checked by Jake Bathe RN/ Hyu Kiser RN. Started at 38 and ended at 1925.  122ml infused.  [place] Unit # [id number] : Checked by Eleonore Chiquito RN/ Wyline Beady RN. Started at 2040 and ended at 2130. 362ml infused.  PLT Unit # [id number] : Checked by Cassell Smiles RN/ Dustin Folks RN. Started at 2100 and ended at 2110. 170ml infused.

## 2015-07-15 NOTE — Anesthesia Preprocedure Evaluation (Addendum)
Anesthesia Evaluation  Patient identified by MRN, date of birth, ID band Patient unresponsive    Reviewed: Allergy & Precautions, Patient's Chart, lab work & pertinent test results, reviewed documented beta blocker date and time , Unable to perform ROS - Chart review onlyPreop documentation limited or incomplete due to emergent nature of procedure.  Airway Mallampati: Intubated       Dental   Pulmonary former smoker,     + decreased breath sounds      Cardiovascular hypertension, Pt. on medications and Pt. on home beta blockers + angina + CAD, + Past MI, + CABG and +CHF   Rate:Normal     Neuro/Psych PSYCHIATRIC DISORDERS negative neurological ROS     GI/Hepatic GERD  ,  Endo/Other  diabetes, Type 2, Insulin Dependent  Renal/GU ESRF and DialysisRenal disease  negative genitourinary   Musculoskeletal   Abdominal (+) + obese,   Peds negative pediatric ROS (+)  Hematology   Anesthesia Other Findings   Reproductive/Obstetrics                           Lab Results  Component Value Date   WBC 11.7* 07/11/2015   HGB 10.4* 07/16/2015   HCT 31.3* 07/14/2015   MCV 86.2 07/22/2015   PLT 46* 08/05/2015   Lab Results  Component Value Date   CREATININE 3.00* 07/27/2015   BUN 24* 07/28/2015   NA 146* 07/24/2015   K 3.6 08/06/2015   CL 107 07/23/2015   CO2 30 08/03/2015   Lab Results  Component Value Date   INR 1.43 08/04/2015   INR 1.60* 07/18/2015   INR 1.54* 07/14/2015    07/2015 EKG: normal sinus rhythm, 1st degree AV block.   Anesthesia Physical Anesthesia Plan  ASA: IV and emergent  Anesthesia Plan: General   Post-op Pain Management:    Induction: Inhalational  Airway Management Planned: Oral ETT  Additional Equipment: Arterial line, TEE, CVP and PA Cath  Intra-op Plan:   Post-operative Plan: Post-operative intubation/ventilation  Informed Consent: I have reviewed the  patients History and Physical, chart, labs and discussed the procedure including the risks, benefits and alternatives for the proposed anesthesia with the patient or authorized representative who has indicated his/her understanding and acceptance.     Plan Discussed with: CRNA  Anesthesia Plan Comments:         Anesthesia Quick Evaluation

## 2015-07-15 NOTE — Progress Notes (Signed)
Dr. Nils Pyle notified of chest tube output of 472ml in ~30 minutes. Orders received for factor VII 6mg  and repeat cbc and pt/inr. Will continue to monitor closely. Eleonore Chiquito RN 2 [place]

## 2015-07-15 NOTE — Progress Notes (Signed)
  Echocardiogram Echocardiogram Transesophageal has been performed.  Jennette Dubin 07/20/2015, 8:57 AM

## 2015-07-15 NOTE — Brief Op Note (Signed)
06/24/2015 - 07/24/2015  11:51 PM  PATIENT:  Joseph [name]  70 y.o. male  PRE-OPERATIVE DIAGNOSIS:  Bring Back Heart for bleeding  POST-OPERATIVE DIAGNOSIS:  Bring Back Heart - coagulopathy  PROCEDURE:  Procedure(s): EXPLORATION POST OPERATIVE OPEN HEART (N/A)  SURGEON:  Surgeon(s) and Role:    * Ivin Poot, MD - Primary  PHYSICIAN ASSISTANT:   ASSISTANTS: none   ANESTHESIA:   general  EBL:  Total I/O In: 4265.1 [I.V.:527.1; EG:5621223; IV Piggyback:400] Out: 2080 [Urine:170; Chest Tube:1910]  BLOOD ADMINISTERED: 2 PRBC, 2 Plts, 2 Cryo ,1 FFP  DRAINS:  2 mts, 2 PTs  LOCAL MEDICATIONS USED:  NONE  SPECIMEN:  No Specimen  DISPOSITION OF SPECIMEN:  PATHOLOGY  COUNTS:  YES  TOURNIQUET:  * No tourniquets in log *  DICTATION: .Dragon Dictation  PLAN OF CARE: return to 2 S 07  PATIENT DISPOSITION:  ICU - intubated and critically ill.   Delay start of Pharmacological VTE agent (>24hrs) due to surgical blood loss or risk of bleeding: yes

## 2015-07-16 ENCOUNTER — Inpatient Hospital Stay (HOSPITAL_COMMUNITY): Payer: Medicare Other

## 2015-07-16 ENCOUNTER — Encounter (HOSPITAL_COMMUNITY): Payer: Self-pay | Admitting: Cardiothoracic Surgery

## 2015-07-16 DIAGNOSIS — R570 Cardiogenic shock: Secondary | ICD-10-CM

## 2015-07-16 DIAGNOSIS — I255 Ischemic cardiomyopathy: Secondary | ICD-10-CM

## 2015-07-16 DIAGNOSIS — J9601 Acute respiratory failure with hypoxia: Secondary | ICD-10-CM

## 2015-07-16 LAB — PREPARE FRESH FROZEN PLASMA
UNIT DIVISION: 0
UNIT DIVISION: 0
UNIT DIVISION: 0
UNIT DIVISION: 0
UNIT DIVISION: 0
Unit division: 0
Unit division: 0
Unit division: 0
Unit division: 0
Unit division: 0
Unit division: 0
Unit division: 0
Unit division: 0

## 2015-07-16 LAB — CBC
HCT: 30.1 % — ABNORMAL LOW (ref 39.0–52.0)
HCT: 28 % — ABNORMAL LOW (ref 39.0–52.0)
HCT: 29 % — ABNORMAL LOW (ref 39.0–52.0)
HCT: 28.6 % — ABNORMAL LOW (ref 39.0–52.0)
HCT: 27.6 % — ABNORMAL LOW (ref 39.0–52.0)
HCT: 26.4 % — ABNORMAL LOW (ref 39.0–52.0)
Hemoglobin: 10 g/dL — ABNORMAL LOW (ref 13.0–17.0)
Hemoglobin: 9.5 g/dL — ABNORMAL LOW (ref 13.0–17.0)
Hemoglobin: 9.8 g/dL — ABNORMAL LOW (ref 13.0–17.0)
Hemoglobin: 9.8 g/dL — ABNORMAL LOW (ref 13.0–17.0)
Hemoglobin: 9.3 g/dL — ABNORMAL LOW (ref 13.0–17.0)
Hemoglobin: 8.8 g/dL — ABNORMAL LOW (ref 13.0–17.0)
MCH: 28.8 pg (ref 26.0–34.0)
MCH: 28.7 pg (ref 26.0–34.0)
MCH: 28.2 pg (ref 26.0–34.0)
MCH: 29.1 pg (ref 26.0–34.0)
MCH: 28.5 pg (ref 26.0–34.0)
MCH: 28.6 pg (ref 26.0–34.0)
MCHC: 33.2 g/dL (ref 30.0–36.0)
MCHC: 33.9 g/dL (ref 30.0–36.0)
MCHC: 33.8 g/dL (ref 30.0–36.0)
MCHC: 34.3 g/dL (ref 30.0–36.0)
MCHC: 33.7 g/dL (ref 30.0–36.0)
MCHC: 33.3 g/dL (ref 30.0–36.0)
MCV: 86.7 fL (ref 78.0–100.0)
MCV: 84.6 fL (ref 78.0–100.0)
MCV: 83.6 fL (ref 78.0–100.0)
MCV: 84.9 fL (ref 78.0–100.0)
MCV: 84.7 fL (ref 78.0–100.0)
MCV: 85.7 fL (ref 78.0–100.0)
Platelets: 60 10*3/uL — ABNORMAL LOW (ref 150–400)
Platelets: 57 10*3/uL — ABNORMAL LOW (ref 150–400)
Platelets: 50 10*3/uL — ABNORMAL LOW (ref 150–400)
Platelets: 52 10*3/uL — ABNORMAL LOW (ref 150–400)
Platelets: 48 10*3/uL — ABNORMAL LOW (ref 150–400)
Platelets: 42 10*3/uL — ABNORMAL LOW (ref 150–400)
RBC: 3.47 MIL/uL — ABNORMAL LOW (ref 4.22–5.81)
RBC: 3.31 MIL/uL — ABNORMAL LOW (ref 4.22–5.81)
RBC: 3.47 MIL/uL — ABNORMAL LOW (ref 4.22–5.81)
RBC: 3.37 MIL/uL — ABNORMAL LOW (ref 4.22–5.81)
RBC: 3.26 MIL/uL — ABNORMAL LOW (ref 4.22–5.81)
RBC: 3.08 MIL/uL — ABNORMAL LOW (ref 4.22–5.81)
RDW: 14.7 % (ref 11.5–15.5)
RDW: 14.7 % (ref 11.5–15.5)
RDW: 14.9 % (ref 11.5–15.5)
RDW: 15.8 % — ABNORMAL HIGH (ref 11.5–15.5)
RDW: 15.9 % — ABNORMAL HIGH (ref 11.5–15.5)
RDW: 16.3 % — ABNORMAL HIGH (ref 11.5–15.5)
WBC: 9.8 10*3/uL (ref 4.0–10.5)
WBC: 6.9 10*3/uL (ref 4.0–10.5)
WBC: 7.6 10*3/uL (ref 4.0–10.5)
WBC: 12.1 10*3/uL — ABNORMAL HIGH (ref 4.0–10.5)
WBC: 12.6 10*3/uL — ABNORMAL HIGH (ref 4.0–10.5)
WBC: 12.4 10*3/uL — ABNORMAL HIGH (ref 4.0–10.5)

## 2015-07-16 LAB — POCT I-STAT 7, (LYTES, BLD GAS, ICA,H+H)
ACID-BASE DEFICIT: 11 mmol/L — AB (ref 0.0–2.0)
ACID-BASE DEFICIT: 8 mmol/L — AB (ref 0.0–2.0)
BICARBONATE: 19.9 meq/L — AB (ref 20.0–24.0)
Bicarbonate: 21.5 mEq/L (ref 20.0–24.0)
CALCIUM ION: 1.32 mmol/L — AB (ref 1.13–1.30)
Calcium, Ion: 1.32 mmol/L — ABNORMAL HIGH (ref 1.13–1.30)
HEMATOCRIT: 28 % — AB (ref 39.0–52.0)
HEMATOCRIT: 29 % — AB (ref 39.0–52.0)
HEMOGLOBIN: 9.5 g/dL — AB (ref 13.0–17.0)
HEMOGLOBIN: 9.9 g/dL — AB (ref 13.0–17.0)
O2 SAT: 87 %
O2 Saturation: 63 %
POTASSIUM: 3.6 mmol/L (ref 3.5–5.1)
Potassium: 4.3 mmol/L (ref 3.5–5.1)
SODIUM: 145 mmol/L (ref 135–145)
Sodium: 144 mmol/L (ref 135–145)
TCO2: 22 mmol/L (ref 0–100)
TCO2: 24 mmol/L (ref 0–100)
pCO2 arterial: 61.6 mmHg (ref 35.0–45.0)
pCO2 arterial: 69.5 mmHg (ref 35.0–45.0)
pH, Arterial: 7.056 — CL (ref 7.350–7.450)
pH, Arterial: 7.142 — CL (ref 7.350–7.450)
pO2, Arterial: 43 mmHg — ABNORMAL LOW (ref 80.0–100.0)
pO2, Arterial: 64 mmHg — ABNORMAL LOW (ref 80.0–100.0)

## 2015-07-16 LAB — POCT I-STAT 3, ART BLOOD GAS (G3+)
ACID-BASE DEFICIT: 7 mmol/L — AB (ref 0.0–2.0)
ACID-BASE DEFICIT: 8 mmol/L — AB (ref 0.0–2.0)
ACID-BASE DEFICIT: 8 mmol/L — AB (ref 0.0–2.0)
Acid-base deficit: 5 mmol/L — ABNORMAL HIGH (ref 0.0–2.0)
Acid-base deficit: 3 mmol/L — ABNORMAL HIGH (ref 0.0–2.0)
Acid-base deficit: 4 mmol/L — ABNORMAL HIGH (ref 0.0–2.0)
Acid-base deficit: 5 mmol/L — ABNORMAL HIGH (ref 0.0–2.0)
Acid-base deficit: 7 mmol/L — ABNORMAL HIGH (ref 0.0–2.0)
BICARBONATE: 20.2 meq/L (ref 20.0–24.0)
BICARBONATE: 23.9 meq/L (ref 20.0–24.0)
Bicarbonate: 22.6 mEq/L (ref 20.0–24.0)
Bicarbonate: 22.1 mEq/L (ref 20.0–24.0)
Bicarbonate: 21.5 mEq/L (ref 20.0–24.0)
Bicarbonate: 20.4 mEq/L (ref 20.0–24.0)
Bicarbonate: 19.4 mEq/L — ABNORMAL LOW (ref 20.0–24.0)
Bicarbonate: 19.4 mEq/L — ABNORMAL LOW (ref 20.0–24.0)
O2 SAT: 91 %
O2 SAT: 91 %
O2 Saturation: 99 %
O2 Saturation: 84 %
O2 Saturation: 89 %
O2 Saturation: 89 %
O2 Saturation: 90 %
O2 Saturation: 92 %
PCO2 ART: 50.8 mmHg — AB (ref 35.0–45.0)
PCO2 ART: 53.3 mmHg — AB (ref 35.0–45.0)
PCO2 ART: 63.5 mmHg — AB (ref 35.0–45.0)
PH ART: 7.178 — AB (ref 7.350–7.450)
PH ART: 7.179 — AB (ref 7.350–7.450)
PH ART: 7.195 — AB (ref 7.350–7.450)
PH ART: 7.195 — AB (ref 7.350–7.450)
PH ART: 7.247 — AB (ref 7.350–7.450)
PO2 ART: 166 mmHg — AB (ref 80.0–100.0)
PO2 ART: 71 mmHg — AB (ref 80.0–100.0)
Patient temperature: 36.1
Patient temperature: 37.4
Patient temperature: 37.3
Patient temperature: 35.9
Patient temperature: 36.9
Patient temperature: 38.1
TCO2: 21 mmol/L (ref 0–100)
TCO2: 21 mmol/L (ref 0–100)
TCO2: 22 mmol/L (ref 0–100)
TCO2: 22 mmol/L (ref 0–100)
TCO2: 23 mmol/L (ref 0–100)
TCO2: 23 mmol/L (ref 0–100)
TCO2: 24 mmol/L (ref 0–100)
TCO2: 26 mmol/L (ref 0–100)
pCO2 arterial: 44.1 mmHg (ref 35.0–45.0)
pCO2 arterial: 46.3 mmHg — ABNORMAL HIGH (ref 35.0–45.0)
pCO2 arterial: 44.7 mmHg (ref 35.0–45.0)
pCO2 arterial: 46.4 mmHg — ABNORMAL HIGH (ref 35.0–45.0)
pCO2 arterial: 52.9 mmHg — ABNORMAL HIGH (ref 35.0–45.0)
pH, Arterial: 7.319 — ABNORMAL LOW (ref 7.350–7.450)
pH, Arterial: 7.288 — ABNORMAL LOW (ref 7.350–7.450)
pH, Arterial: 7.284 — ABNORMAL LOW (ref 7.350–7.450)
pO2, Arterial: 54 mmHg — ABNORMAL LOW (ref 80.0–100.0)
pO2, Arterial: 64 mmHg — ABNORMAL LOW (ref 80.0–100.0)
pO2, Arterial: 60 mmHg — ABNORMAL LOW (ref 80.0–100.0)
pO2, Arterial: 75 mmHg — ABNORMAL LOW (ref 80.0–100.0)
pO2, Arterial: 79 mmHg — ABNORMAL LOW (ref 80.0–100.0)
pO2, Arterial: 83 mmHg (ref 80.0–100.0)

## 2015-07-16 LAB — PREPARE PLATELET PHERESIS
UNIT DIVISION: 0
UNIT DIVISION: 0
UNIT DIVISION: 0
Unit division: 0
Unit division: 0
Unit division: 0
Unit division: 0

## 2015-07-16 LAB — BLOOD GAS, ARTERIAL
Acid-base deficit: 3.6 mmol/L — ABNORMAL HIGH (ref 0.0–2.0)
Bicarbonate: 21.8 mEq/L (ref 20.0–24.0)
Drawn by: 364961
FIO2: 0.8
MECHVT: 450 mL
O2 Saturation: 98.8 %
PEEP: 10 cmH2O
Patient temperature: 98.4
RATE: 35 resp/min
TCO2: 23.2 mmol/L (ref 0–100)
pCO2 arterial: 45.3 mmHg — ABNORMAL HIGH (ref 35.0–45.0)
pH, Arterial: 7.303 — ABNORMAL LOW (ref 7.350–7.450)
pO2, Arterial: 123 mmHg — ABNORMAL HIGH (ref 80.0–100.0)

## 2015-07-16 LAB — RENAL FUNCTION PANEL
Albumin: 2.7 g/dL — ABNORMAL LOW (ref 3.5–5.0)
Anion gap: 12 (ref 5–15)
BUN: 22 mg/dL — ABNORMAL HIGH (ref 6–20)
CO2: 19 mmol/L — ABNORMAL LOW (ref 22–32)
Calcium: 8.2 mg/dL — ABNORMAL LOW (ref 8.9–10.3)
Chloride: 111 mmol/L (ref 101–111)
Creatinine, Ser: 3.77 mg/dL — ABNORMAL HIGH (ref 0.61–1.24)
GFR calc Af Amer: 18 mL/min — ABNORMAL LOW (ref >60)
GFR calc non Af Amer: 15 mL/min — ABNORMAL LOW (ref >60)
Glucose, Bld: 116 mg/dL — ABNORMAL HIGH (ref 65–99)
Phosphorus: 6.4 mg/dL — ABNORMAL HIGH (ref 2.5–4.6)
Potassium: 4.9 mmol/L (ref 3.5–5.1)
Sodium: 142 mmol/L (ref 135–145)

## 2015-07-16 LAB — CARBOXYHEMOGLOBIN
Carboxyhemoglobin: 1 % (ref 0.5–1.5)
Methemoglobin: 1.4 % (ref 0.0–1.5)
O2 Saturation: 78.8 %
Total hemoglobin: 8.9 g/dL — ABNORMAL LOW (ref 13.5–18.0)

## 2015-07-16 LAB — POCT I-STAT 4, (NA,K, GLUC, HGB,HCT)
Glucose, Bld: 118 mg/dL — ABNORMAL HIGH (ref 65–99)
HCT: 27 % — ABNORMAL LOW (ref 39.0–52.0)
Hemoglobin: 9.2 g/dL — ABNORMAL LOW (ref 13.0–17.0)
Potassium: 4.8 mmol/L (ref 3.5–5.1)
Sodium: 144 mmol/L (ref 135–145)

## 2015-07-16 LAB — GLUCOSE, CAPILLARY
GLUCOSE-CAPILLARY: 161 mg/dL — AB (ref 65–99)
GLUCOSE-CAPILLARY: 165 mg/dL — AB (ref 65–99)
Glucose-Capillary: 173 mg/dL — ABNORMAL HIGH (ref 65–99)
Glucose-Capillary: 143 mg/dL — ABNORMAL HIGH (ref 65–99)
Glucose-Capillary: 123 mg/dL — ABNORMAL HIGH (ref 65–99)

## 2015-07-16 LAB — POCT I-STAT, CHEM 8
BUN: 23 mg/dL — AB (ref 6–20)
BUN: 23 mg/dL — AB (ref 6–20)
BUN: 24 mg/dL — ABNORMAL HIGH (ref 6–20)
BUN: 24 mg/dL — ABNORMAL HIGH (ref 6–20)
BUN: 24 mg/dL — ABNORMAL HIGH (ref 6–20)
CALCIUM ION: 1.14 mmol/L (ref 1.13–1.30)
CALCIUM ION: 1.14 mmol/L (ref 1.13–1.30)
CALCIUM ION: 1.36 mmol/L — AB (ref 1.13–1.30)
CHLORIDE: 105 mmol/L (ref 101–111)
CHLORIDE: 105 mmol/L (ref 101–111)
CREATININE: 3 mg/dL — AB (ref 0.61–1.24)
CREATININE: 3.8 mg/dL — AB (ref 0.61–1.24)
Calcium, Ion: 1.21 mmol/L (ref 1.13–1.30)
Calcium, Ion: 1.19 mmol/L (ref 1.13–1.30)
Chloride: 105 mmol/L (ref 101–111)
Chloride: 105 mmol/L (ref 101–111)
Chloride: 105 mmol/L (ref 101–111)
Creatinine, Ser: 3.6 mg/dL — ABNORMAL HIGH (ref 0.61–1.24)
Creatinine, Ser: 3.5 mg/dL — ABNORMAL HIGH (ref 0.61–1.24)
Creatinine, Ser: 3.8 mg/dL — ABNORMAL HIGH (ref 0.61–1.24)
GLUCOSE: 119 mg/dL — AB (ref 65–99)
GLUCOSE: 195 mg/dL — AB (ref 65–99)
Glucose, Bld: 104 mg/dL — ABNORMAL HIGH (ref 65–99)
Glucose, Bld: 103 mg/dL — ABNORMAL HIGH (ref 65–99)
Glucose, Bld: 134 mg/dL — ABNORMAL HIGH (ref 65–99)
HCT: 28 % — ABNORMAL LOW (ref 39.0–52.0)
HCT: 27 % — ABNORMAL LOW (ref 39.0–52.0)
HCT: 28 % — ABNORMAL LOW (ref 39.0–52.0)
HCT: 25 % — ABNORMAL LOW (ref 39.0–52.0)
HEMATOCRIT: 26 % — AB (ref 39.0–52.0)
Hemoglobin: 9.5 g/dL — ABNORMAL LOW (ref 13.0–17.0)
Hemoglobin: 9.2 g/dL — ABNORMAL LOW (ref 13.0–17.0)
Hemoglobin: 9.5 g/dL — ABNORMAL LOW (ref 13.0–17.0)
Hemoglobin: 8.5 g/dL — ABNORMAL LOW (ref 13.0–17.0)
Hemoglobin: 8.8 g/dL — ABNORMAL LOW (ref 13.0–17.0)
POTASSIUM: 3.3 mmol/L — AB (ref 3.5–5.1)
Potassium: 4.7 mmol/L (ref 3.5–5.1)
Potassium: 4.7 mmol/L (ref 3.5–5.1)
Potassium: 4.8 mmol/L (ref 3.5–5.1)
Potassium: 4.6 mmol/L (ref 3.5–5.1)
SODIUM: 142 mmol/L (ref 135–145)
SODIUM: 142 mmol/L (ref 135–145)
Sodium: 143 mmol/L (ref 135–145)
Sodium: 143 mmol/L (ref 135–145)
Sodium: 144 mmol/L (ref 135–145)
TCO2: 25 mmol/L (ref 0–100)
TCO2: 23 mmol/L (ref 0–100)
TCO2: 23 mmol/L (ref 0–100)
TCO2: 21 mmol/L (ref 0–100)
TCO2: 21 mmol/L (ref 0–100)

## 2015-07-16 LAB — PREPARE CRYOPRECIPITATE
Unit division: 0
Unit division: 0
Unit division: 0
Unit division: 0
Unit division: 0
Unit division: 0
Unit division: 0
Unit division: 0

## 2015-07-16 LAB — PREPARE RBC (CROSSMATCH)

## 2015-07-16 LAB — CREATININE, SERUM
Creatinine, Ser: 3.42 mg/dL — ABNORMAL HIGH (ref 0.61–1.24)
Creatinine, Ser: 3.72 mg/dL — ABNORMAL HIGH (ref 0.61–1.24)
GFR calc Af Amer: 20 mL/min — ABNORMAL LOW (ref >60)
GFR calc Af Amer: 18 mL/min — ABNORMAL LOW (ref >60)
GFR calc non Af Amer: 17 mL/min — ABNORMAL LOW (ref >60)
GFR calc non Af Amer: 15 mL/min — ABNORMAL LOW (ref >60)

## 2015-07-16 LAB — DIC (DISSEMINATED INTRAVASCULAR COAGULATION)PANEL
D-Dimer, Quant: 0.29 ug/mL-FEU (ref 0.00–0.50)
Fibrinogen: 245 mg/dL (ref 204–475)
INR: 1.05 (ref 0.00–1.49)
Platelets: 59 10*3/uL — ABNORMAL LOW (ref 150–400)
Prothrombin Time: 13.9 seconds (ref 11.6–15.2)
Smear Review: NONE SEEN
aPTT: 52 seconds — ABNORMAL HIGH (ref 24–37)

## 2015-07-16 LAB — BASIC METABOLIC PANEL
Anion gap: 11 (ref 5–15)
BUN: 21 mg/dL — ABNORMAL HIGH (ref 6–20)
CO2: 23 mmol/L (ref 22–32)
Calcium: 9 mg/dL (ref 8.9–10.3)
Chloride: 108 mmol/L (ref 101–111)
Creatinine, Ser: 3.59 mg/dL — ABNORMAL HIGH (ref 0.61–1.24)
GFR calc Af Amer: 19 mL/min — ABNORMAL LOW (ref >60)
GFR calc non Af Amer: 16 mL/min — ABNORMAL LOW (ref >60)
Glucose, Bld: 139 mg/dL — ABNORMAL HIGH (ref 65–99)
Potassium: 4.2 mmol/L (ref 3.5–5.1)
Sodium: 142 mmol/L (ref 135–145)

## 2015-07-16 LAB — MAGNESIUM
Magnesium: 1.7 mg/dL (ref 1.7–2.4)
Magnesium: 1.5 mg/dL — ABNORMAL LOW (ref 1.7–2.4)
Magnesium: 1.8 mg/dL (ref 1.7–2.4)

## 2015-07-16 LAB — SEROTONIN RELEASE ASSAY (SRA)
SRA .2 IU/mL UFH Ser-aCnc: 11 % (ref 0–20)
SRA 100IU/mL UFH Ser-aCnc: 2 % (ref 0–20)

## 2015-07-16 MED ORDER — SODIUM CHLORIDE 0.9 % IV SOLN: Freq: Once | INTRAVENOUS | Status: DC

## 2015-07-16 MED ORDER — ANTISEPTIC ORAL RINSE SOLUTION (CORINZ)
7.0000 mL | OROMUCOSAL | Status: DC
  Administered 2015-07-16 – 2015-08-10 (×247): 7 mL via OROMUCOSAL

## 2015-07-16 MED ORDER — SODIUM BICARBONATE 4.2 % IV SOLN
INTRAVENOUS | Status: DC | PRN
  Administered 2015-07-16: 25 meq via INTRAVENOUS

## 2015-07-16 MED ORDER — FENTANYL CITRATE (PF) 100 MCG/2ML IJ SOLN
100.0000 ug | Freq: Once | INTRAMUSCULAR | Status: DC | PRN
  Filled 2015-07-16: qty 2

## 2015-07-16 MED ORDER — SODIUM BICARBONATE 8.4 % IV SOLN
50.0000 meq | Freq: Once | INTRAVENOUS | Status: AC
  Administered 2015-07-16: 50 meq via INTRAVENOUS

## 2015-07-16 MED ORDER — SODIUM CHLORIDE 0.9 % IV SOLN
30.0000 ug | Freq: Once | INTRAVENOUS | Status: AC
  Administered 2015-07-16: 30 ug via INTRAVENOUS
  Filled 2015-07-16: qty 7.5

## 2015-07-16 MED ORDER — PIPERACILLIN-TAZOBACTAM 3.375 G IVPB
3.3750 g | Freq: Four times a day (QID) | INTRAVENOUS | Status: DC
  Administered 2015-07-17 – 2015-07-23 (×25): 3.375 g via INTRAVENOUS
  Filled 2015-07-16 (×28): qty 50

## 2015-07-16 MED ORDER — HEPARIN SODIUM (PORCINE) 1000 UNIT/ML DIALYSIS
1000.0000 [IU] | INTRAMUSCULAR | Status: DC | PRN
  Filled 2015-07-16: qty 6

## 2015-07-16 MED ORDER — MAGNESIUM SULFATE 2 GM/50ML IV SOLN
2.0000 g | Freq: Once | INTRAVENOUS | Status: AC
  Administered 2015-07-16: 2 g via INTRAVENOUS
  Filled 2015-07-16: qty 50

## 2015-07-16 MED ORDER — CALCIUM CHLORIDE 10 % IV SOLN
1.0000 g | Freq: Once | INTRAVENOUS | Status: AC
  Administered 2015-07-16: 1 g via INTRAVENOUS

## 2015-07-16 MED ORDER — MIDAZOLAM HCL 2 MG/2ML IJ SOLN
2.0000 mg | Freq: Once | INTRAMUSCULAR | Status: DC | PRN
Start: 2015-07-16 — End: 2015-07-22

## 2015-07-16 MED ORDER — NOREPINEPHRINE BITARTRATE 1 MG/ML IV SOLN: 0.0000 ug/min | INTRAVENOUS | Status: DC

## 2015-07-16 MED ORDER — SODIUM CHLORIDE 0.9 % IV SOLN
100.0000 ug/h | INTRAVENOUS | Status: DC
  Administered 2015-07-16 (×2): 100 ug/h via INTRAVENOUS
  Administered 2015-07-17 – 2015-07-18 (×2): 200 ug/h via INTRAVENOUS
  Administered 2015-07-18 (×2): 250 ug/h via INTRAVENOUS
  Administered 2015-07-19 – 2015-07-22 (×7): 200 ug/h via INTRAVENOUS
  Administered 2015-07-23 – 2015-07-24 (×2): 150 ug/h via INTRAVENOUS
  Administered 2015-07-26 – 2015-07-27 (×3): 75 ug/h via INTRAVENOUS
  Administered 2015-07-28: 175 ug/h via INTRAVENOUS
  Administered 2015-07-28: 100 ug/h via INTRAVENOUS
  Administered 2015-07-29: 300 ug/h via INTRAVENOUS
  Administered 2015-08-01: 30 ug/h via INTRAVENOUS
  Administered 2015-08-02: 25 ug/h via INTRAVENOUS
  Administered 2015-08-03: 100 ug/h via INTRAVENOUS
  Administered 2015-08-04 (×2): 175 ug/h via INTRAVENOUS
  Administered 2015-08-05: 50 ug/h via INTRAVENOUS
  Administered 2015-08-06 – 2015-08-07 (×2): 100 ug/h via INTRAVENOUS
  Administered 2015-08-07 – 2015-08-08 (×2): 150 ug/h via INTRAVENOUS
  Administered 2015-08-09: 225 ug/h via INTRAVENOUS
  Administered 2015-08-10: 300 ug/h via INTRAVENOUS
  Filled 2015-07-16 (×36): qty 50

## 2015-07-16 MED ORDER — MIDAZOLAM BOLUS VIA INFUSION
2.0000 mg | INTRAVENOUS | Status: DC | PRN
  Filled 2015-07-16: qty 2

## 2015-07-16 MED ORDER — VANCOMYCIN HCL IN DEXTROSE 1-5 GM/200ML-% IV SOLN
1000.0000 mg | Freq: Once | INTRAVENOUS | Status: AC
  Administered 2015-07-16: 1000 mg via INTRAVENOUS
  Filled 2015-07-16: qty 200

## 2015-07-16 MED ORDER — FENTANYL CITRATE (PF) 100 MCG/2ML IJ SOLN: 100.0000 ug | Freq: Once | INTRAMUSCULAR | Status: DC

## 2015-07-16 MED ORDER — LIDOCAINE IN D5W 4-5 MG/ML-% IV SOLN: 1.0000 mg/min | INTRAVENOUS | Status: DC

## 2015-07-16 MED ORDER — PIPERACILLIN-TAZOBACTAM 3.375 G IVPB
3.3750 g | Freq: Three times a day (TID) | INTRAVENOUS | Status: DC
  Administered 2015-07-16: 3.375 g via INTRAVENOUS
  Filled 2015-07-16 (×2): qty 50

## 2015-07-16 MED ORDER — ARTIFICIAL TEARS OP OINT
1.0000 "application " | TOPICAL_OINTMENT | Freq: Three times a day (TID) | OPHTHALMIC | Status: DC
  Administered 2015-07-16 – 2015-07-21 (×17): 1 via OPHTHALMIC
  Filled 2015-07-16 (×3): qty 3.5

## 2015-07-16 MED ORDER — SODIUM CHLORIDE 0.9 % IV SOLN
3.0000 ug/kg/min | INTRAVENOUS | Status: DC
  Administered 2015-07-16: 1.5 ug/kg/min via INTRAVENOUS
  Administered 2015-07-16: 3 ug/kg/min via INTRAVENOUS
  Administered 2015-07-17: 1 ug/kg/min via INTRAVENOUS
  Administered 2015-07-18: 2.5 ug/kg/min via INTRAVENOUS
  Administered 2015-07-18: 4 ug/kg/min via INTRAVENOUS
  Administered 2015-07-19 (×4): 5 ug/kg/min via INTRAVENOUS
  Administered 2015-07-20: 6 ug/kg/min via INTRAVENOUS
  Administered 2015-07-20: 5.5 ug/kg/min via INTRAVENOUS
  Administered 2015-07-20: 5 ug/kg/min via INTRAVENOUS
  Administered 2015-07-21 (×2): 5.5 ug/kg/min via INTRAVENOUS
  Filled 2015-07-16 (×14): qty 20

## 2015-07-16 MED ORDER — FAMOTIDINE IN NACL 20-0.9 MG/50ML-% IV SOLN
20.0000 mg | Freq: Two times a day (BID) | INTRAVENOUS | Status: DC
  Administered 2015-07-16: 20 mg via INTRAVENOUS
  Filled 2015-07-16: qty 50

## 2015-07-16 MED ORDER — PRISMASOL BGK 4/2.5 32-4-2.5 MEQ/L IV SOLN
INTRAVENOUS | Status: DC
  Administered 2015-07-16 – 2015-07-21 (×9): via INTRAVENOUS_CENTRAL
  Filled 2015-07-16 (×11): qty 5000

## 2015-07-16 MED ORDER — CISATRACURIUM BOLUS VIA INFUSION
0.0500 mg/kg | Freq: Once | INTRAVENOUS | Status: DC
  Filled 2015-07-16: qty 5

## 2015-07-16 MED ORDER — CHLORHEXIDINE GLUCONATE 0.12% ORAL RINSE (MEDLINE KIT)
15.0000 mL | Freq: Two times a day (BID) | OROMUCOSAL | Status: DC
  Administered 2015-07-16: 15 mL via OROMUCOSAL

## 2015-07-16 MED ORDER — NITROGLYCERIN IN D5W 200-5 MCG/ML-% IV SOLN: 2.0000 ug/min | INTRAVENOUS | Status: DC

## 2015-07-16 MED ORDER — SODIUM BICARBONATE 8.4 % IV SOLN
100.0000 meq | Freq: Once | INTRAVENOUS | Status: AC
  Administered 2015-07-16: 100 meq via INTRAVENOUS
  Filled 2015-07-16: qty 100

## 2015-07-16 MED ORDER — ANTICOAGULANT SODIUM CITRATE 4% (200MG/5ML) IV SOLN
5.0000 mL | Status: DC | PRN
  Filled 2015-07-16 (×4): qty 250

## 2015-07-16 MED ORDER — ANTISEPTIC ORAL RINSE SOLUTION (CORINZ)
7.0000 mL | Freq: Four times a day (QID) | OROMUCOSAL | Status: DC
  Administered 2015-07-16 (×2): 7 mL via OROMUCOSAL

## 2015-07-16 MED ORDER — SODIUM BICARBONATE 8.4 % IV SOLN
INTRAVENOUS | Status: AC
  Administered 2015-07-16: 50 meq
  Filled 2015-07-16: qty 50

## 2015-07-16 MED ORDER — SODIUM CHLORIDE 0.9 % IV SOLN
Freq: Once | INTRAVENOUS | Status: AC
  Administered 2015-07-16: 07:00:00 via INTRAVENOUS

## 2015-07-16 MED ORDER — SODIUM BICARBONATE 8.4 % IV SOLN
INTRAVENOUS | Status: AC
  Administered 2015-07-16: 18:00:00
  Filled 2015-07-16: qty 50

## 2015-07-16 MED ORDER — ALBUMIN HUMAN 5 % IV SOLN
INTRAVENOUS | Status: AC
  Administered 2015-07-16: 12.5 g
  Filled 2015-07-16: qty 250

## 2015-07-16 MED ORDER — VASOPRESSIN 20 UNIT/ML IV SOLN
0.0300 [IU]/min | INTRAVENOUS | Status: DC
  Administered 2015-07-16 – 2015-07-18 (×5): 0.04 [IU]/min via INTRAVENOUS
  Administered 2015-07-19: 0.03 [IU]/min via INTRAVENOUS
  Administered 2015-07-19: 0.04 [IU]/min via INTRAVENOUS
  Filled 2015-07-16 (×6): qty 2

## 2015-07-16 MED ORDER — FENTANYL BOLUS VIA INFUSION
50.0000 ug | INTRAVENOUS | Status: DC | PRN
  Administered 2015-07-28 – 2015-07-30 (×17): 50 ug via INTRAVENOUS
  Administered 2015-07-30 (×2): 25 ug via INTRAVENOUS
  Administered 2015-07-30 (×3): 50 ug via INTRAVENOUS
  Administered 2015-07-31 – 2015-08-02 (×4): 25 ug via INTRAVENOUS
  Administered 2015-08-03 – 2015-08-10 (×8): 50 ug via INTRAVENOUS
  Filled 2015-07-16: qty 50

## 2015-07-16 MED ORDER — EPINEPHRINE HCL 1 MG/ML IJ SOLN: 0.0000 ug/min | INTRAVENOUS | Status: DC

## 2015-07-16 MED ORDER — SODIUM CHLORIDE 0.9 % FOR CRRT
INTRAVENOUS_CENTRAL | Status: DC | PRN
  Filled 2015-07-16: qty 1000

## 2015-07-16 MED ORDER — MIDAZOLAM HCL 2 MG/2ML IJ SOLN: 2.0000 mg | Freq: Once | INTRAMUSCULAR | Status: DC

## 2015-07-16 MED ORDER — CHLORHEXIDINE GLUCONATE 0.12% ORAL RINSE (MEDLINE KIT)
15.0000 mL | Freq: Two times a day (BID) | OROMUCOSAL | Status: DC
  Administered 2015-07-16 – 2015-08-10 (×49): 15 mL via OROMUCOSAL

## 2015-07-16 MED ORDER — MIDAZOLAM HCL 5 MG/ML IJ SOLN
2.0000 mg/h | INTRAMUSCULAR | Status: DC
  Administered 2015-07-16 – 2015-07-17 (×3): 2 mg/h via INTRAVENOUS
  Administered 2015-07-18 (×2): 4 mg/h via INTRAVENOUS
  Administered 2015-07-19 – 2015-07-21 (×4): 5 mg/h via INTRAVENOUS
  Administered 2015-07-21 – 2015-07-23 (×4): 4 mg/h via INTRAVENOUS
  Administered 2015-07-24: 2 mg/h via INTRAVENOUS
  Administered 2015-07-25: 3 mg/h via INTRAVENOUS
  Administered 2015-07-26: 1 mg/h via INTRAVENOUS
  Filled 2015-07-16 (×16): qty 10

## 2015-07-16 MED ORDER — VASOPRESSIN 20 UNIT/ML IV SOLN: 0.0400 [IU]/min | INTRAVENOUS | Status: DC

## 2015-07-16 MED ORDER — PRISMASOL BGK 4/2.5 32-4-2.5 MEQ/L IV SOLN
INTRAVENOUS | Status: DC
  Administered 2015-07-16 – 2015-07-21 (×28): via INTRAVENOUS_CENTRAL
  Filled 2015-07-16 (×42): qty 5000

## 2015-07-16 MED ORDER — SODIUM CHLORIDE 0.9 % IV SOLN: INTRAVENOUS | Status: DC

## 2015-07-16 MED ORDER — PRISMASOL BGK 4/2.5 32-4-2.5 MEQ/L IV SOLN
INTRAVENOUS | Status: DC
  Administered 2015-07-16 – 2015-07-19 (×3): via INTRAVENOUS_CENTRAL
  Filled 2015-07-16 (×7): qty 5000

## 2015-07-16 MED ORDER — FAMOTIDINE IN NACL 20-0.9 MG/50ML-% IV SOLN
20.0000 mg | INTRAVENOUS | Status: DC
  Administered 2015-07-17 – 2015-07-24 (×7): 20 mg via INTRAVENOUS
  Filled 2015-07-16 (×7): qty 50

## 2015-07-16 MED FILL — Lidocaine HCl IV Inj 20 MG/ML: INTRAVENOUS | Qty: 5 | Status: AC

## 2015-07-16 MED FILL — Mannitol IV Soln 20%: INTRAVENOUS | Qty: 500 | Status: AC

## 2015-07-16 MED FILL — Calcium Chloride Inj 10%: INTRAVENOUS | Qty: 10 | Status: AC

## 2015-07-16 MED FILL — Albumin, Human Inj 5%: INTRAVENOUS | Qty: 500 | Status: AC

## 2015-07-16 MED FILL — Heparin Sodium (Porcine) Inj 1000 Unit/ML: INTRAMUSCULAR | Qty: 210 | Status: AC

## 2015-07-16 MED FILL — Heparin Sodium (Porcine) Inj 1000 Unit/ML: INTRAMUSCULAR | Qty: 30 | Status: AC

## 2015-07-16 MED FILL — Sodium Bicarbonate IV Soln 8.4%: INTRAVENOUS | Qty: 350 | Status: AC

## 2015-07-16 MED FILL — Electrolyte-R (PH 7.4) Solution: INTRAVENOUS | Qty: 5000 | Status: AC

## 2015-07-16 MED FILL — Heparin Sodium (Porcine) Inj 1000 Unit/ML: INTRAMUSCULAR | Qty: 20 | Status: AC

## 2015-07-16 MED FILL — Sodium Chloride IV Soln 0.9%: INTRAVENOUS | Qty: 3000 | Status: AC

## 2015-07-16 MED FILL — Sodium Chloride IV Soln 0.9%: INTRAVENOUS | Qty: 9000 | Status: AC

## 2015-07-16 NOTE — Progress Notes (Signed)
Cardiologist:Harding Subjective:  Sedate intubated. Postop CABG, taken back to the operating room secondary to bleeding  Objective:  Vital Signs in the last 24 hours: Temp:  [92.5 F (33.6 C)-99.7 F (37.6 C)] 96.3 F (35.7 C) (06/09 1400) Pulse Rate:  [35-105] 105 (06/09 1400) Resp:  [0-35] 35 (06/09 1400) BP: (59-129)/(35-72) 98/50 mmHg (06/09 1400) SpO2:  [88 %-100 %] 96 % (06/09 1400) Arterial Line BP: (59-151)/(26-55) 90/49 mmHg (06/09 1400) FiO2 (%):  [60 %-100 %] 70 % (06/09 1207) Weight:  [263 lb 7.2 oz (119.5 kg)] 263 lb 7.2 oz (119.5 kg) (06/09 0645)  Intake/Output from previous day: 06/08 0701 - 06/09 0700 In: 29132.7 [I.V.:9683.7; TAVWP:79480; NG/GT:30; IV Piggyback:2750] Out: 16553 [Urine:455; ZSMOL:07867; Chest Tube:3290]   Physical Exam: General: Ill-appearing, sedate. Head:  Normocephalic and atraumatic. ETT Lungs: Vent noise. Heart: Normal S1 and S2.  No murmur, rubs or gallops.  Abdomen: soft, non-tender, positive bowel sounds. Extremities: Positive edema. Neurologic: Alert and oriented x 3.    Lab Results:  Recent Labs  07/16/15 0520 07/16/15 0907 07/16/15 0939 07/16/15 1313  WBC 6.9 7.6  --   --   HGB 9.5* 9.8* 9.2* 9.5*  PLT 57* 50*  --   --     Recent Labs  07/14/2015 0421  07/16/15 0520 07/16/15 0939 07/16/15 1313  NA 137  < > 142 143 144  K 3.9  < > 4.2 4.7 4.7  CL 100*  < > 108 105 105  CO2 30  --  23  --   --   GLUCOSE 188*  < > 139* 104* 103*  BUN 24*  < > 21* 24* 24*  CREATININE 4.24*  < > 3.59* 3.60* 3.50*  < > = values in this interval not displayed.  Recent Labs  07/14/15 2330  TROPONINI 0.15*   Hepatic Function Panel  Recent Labs  07/14/15 0927  PROT 7.7  ALBUMIN 3.7  AST 28  ALT 26  ALKPHOS 74  BILITOT 0.9  BILIDIR 0.2  IBILI 0.7   No results for input(s): CHOL in the last 72 hours. No results for input(s): PROTIME in the last 72 hours.  Imaging: Dg Chest Port 1 View  07/16/2015  CLINICAL DATA:   CABG EXAM: PORTABLE CHEST 1 VIEW COMPARISON:  Chest radiograph from earlier today. FINDINGS: Endotracheal tube tip is approximately 3.8 cm above the carina. Enteric tube enters stomach with the tip not seen on this image. Bilateral chest tubes, mediastinal drains, midline and right shoulder skin staples are stable in configuration. Left internal jugular Swan-Ganz catheter terminates in the main pulmonary artery. Intra-aortic balloon pump marker is at the T3-4 level overlying the very proximal descending aorta. Stable cardiomediastinal silhouette with moderate cardiomegaly. No pneumothorax. Stable trace bilateral pleural effusions. Mild-to-moderate pulmonary edema appears minimally improved. Stable low lung volumes with bilateral lower lobe atelectasis. IMPRESSION: 1. No pneumothorax. 2. Mild-to-moderate congestive heart failure, slightly improved. 3. Low lung volumes with bilateral lower lobe atelectasis, unchanged. 4. Intra-aortic balloon pump marker is at the T3-4 level overlying the very proximal descending aorta. Electronically Signed   By: Ilona Sorrel M.D.   On: 07/16/2015 07:52   Dg Chest Port 1 View  07/16/2015  CLINICAL DATA:  Shortness of breath.  Recent re-exploration. EXAM: PORTABLE CHEST 1 VIEW COMPARISON:  Yesterday FINDINGS: Endotracheal tube tip is at the clavicular heads. Swan-Ganz catheter from left IJ approach directed into the right pulmonary artery. Aortic balloon pump with tip overlapping the expected location of the  arch. Mildly higher appearance of the central thoracic drain. Other drains appears similar to prior. Nasogastric tube tip at least reaches the stomach. Cardiopericardial enlargement that is similar to prior when allowing for lower volumes. Perihilar atelectasis. Layering left effusion. Probable edema. No pneumothorax. IMPRESSION: 1. Similar positioning of tubes and central line. 2. Unchanged cardiopericardial enlargement. 3. Lower volumes with increased atelectasis or layering  fluid on the left. Probable edema. Electronically Signed   By: Monte Fantasia M.D.   On: 07/16/2015 00:33   Dg Chest Port 1 View  07/28/2015  CLINICAL DATA:  Status post CABG x2. EXAM: PORTABLE CHEST 1 VIEW COMPARISON:  Chest x-ray from earlier same day. FINDINGS: Cardiomediastinal silhouette appears stable in size and configuration. Endotracheal tube is well positioned with tip approximately 2.5 cm above the level of the carina. Chest tubes and mediastinal drains appear stable in position. Swan-Ganz catheter appears stable in position with tip in the midline. Nasogastric tube passes below the diaphragm. There is mild central pulmonary vascular congestion without overt alveolar pulmonary edema. Probable mild atelectasis again appreciated at the left lung base, and probable small left pleural effusion. No new lung findings. IMPRESSION: 1. Endotracheal tube now appears well positioned with tip just above the level of the carina. 2. Remainder of the support apparatus appears stable in position. 3. Mild central pulmonary vascular congestion without overt pulmonary edema. 4. Probable stable atelectasis and/or small effusion at the left lung base. Electronically Signed   By: Franki Cabot M.D.   On: 07/26/2015 20:00   Dg Chest Portable 1 View  07/29/2015  CLINICAL DATA:  70 year old male status post CABG EXAM: PORTABLE CHEST 1 VIEW COMPARISON:  Chest radiograph dated 07/14/2015 FINDINGS: Single portable view of the chest. An endotracheal tube is noted with tip approximately 6.5 cm above the carina. Bilateral chest tubes seen the tip of the right chest tube is superimposed over the right posterior sixth rib and the tip of the left chest tube is located in the left upper lung field superimposed over the left posterior fifth rib. A third chest tube is seen at the level of the left hemidiaphragm. A Swan-Ganz catheter with tip at the level of the pulmonary artery. An inferiorly placed catheter extend upward with tip  superimposed over the descending thoracic aorta. This may represent an aortic balloon pump. A scope is seen over the mediastinum. There patchy areas of opacity predominantly involving the left lung which may represent atelectatic changes or infiltrate. There is diffuse increased interstitial markings on the right without focal consolidation. Trace pleural effusion may be present on the right. The left costophrenic angle has been excluded from the image. There is no pneumothorax. There is mild enlargement of the cardiopericardial silhouette. Multiple surgical clips noted in the soft tissues of the right upper chest wall. No acute osseous pathology identified. IMPRESSION: Support lines and tubes as described. Airspace opacity predominantly involving the left lung are more pronounced in the left mid and lower lung field may represent atelectasis versus infiltrate. Clinical correlation and follow-up recommended. No pneumothorax. Mild cardiopericardial enlargement. Electronically Signed   By: Anner Crete M.D.   On: 08/01/2015 19:03   Personally viewed.   Meds: Scheduled Meds: . sodium chloride   Intravenous Once  . sodium chloride   Intravenous Once  . sodium chloride   Intravenous Once  . sodium chloride   Intravenous Once  . acetaminophen  1,000 mg Oral Q6H   Or  . acetaminophen (TYLENOL) oral liquid 160 mg/5 mL  1,000 mg Per Tube Q6H  . antiseptic oral rinse  7 mL Mouth Rinse QID  . antiseptic oral rinse  7 mL Mouth Rinse 10 times per day  . artificial tears  1 application Both Eyes O2D  . bisacodyl  10 mg Oral Daily   Or  . bisacodyl  10 mg Rectal Daily  . calcium acetate  1,334 mg Oral TID WC  . cefUROXime (ZINACEF)  IV  1.5 g Intravenous Q12H  . chlorhexidine gluconate (SAGE KIT)  15 mL Mouth Rinse BID  . chlorhexidine gluconate (SAGE KIT)  15 mL Mouth Rinse BID  . cisatracurium  0.05 mg/kg Intravenous Once  . coagulation factor VIIa recomb  60 mcg/kg Intravenous Once  . darbepoetin  (ARANESP) injection - DIALYSIS  60 mcg Intravenous Q Tue-HD  . docusate sodium  200 mg Oral Daily  . [START ON 07/17/2015] famotidine (PEPCID) IV  20 mg Intravenous Q24H  . fentaNYL (SUBLIMAZE) injection  100 mcg Intravenous Once  . insulin regular  0-10 Units Intravenous TID WC  . magic mouthwash  10 mL Oral TID  . magnesium sulfate  4 g Intravenous Once  . metoprolol tartrate  12.5 mg Oral BID   Or  . metoprolol tartrate  12.5 mg Per Tube BID  . midazolam  2 mg Intravenous Once  . multivitamin  1 tablet Oral QHS  . [START ON 07/17/2015] pantoprazole  40 mg Oral Daily  . pregabalin  75 mg Oral BID  . rosuvastatin  20 mg Oral QHS  . sodium chloride flush  3 mL Intravenous Q12H  . vancomycin  1,000 mg Intravenous Once   Continuous Infusions: . sodium chloride    . sodium chloride    . sodium chloride 20 mL/hr at 07/16/15 1400  . cisatracurium (NIMBEX) infusion 1.5 mcg/kg/min (07/16/15 1439)  . DOPamine    . EPINEPHrine 4 mg in dextrose 5% 250 mL infusion (16 mcg/mL) 10 mcg/min (07/16/15 1435)  . fentaNYL infusion INTRAVENOUS 100 mcg/hr (07/16/15 1400)  . insulin (NOVOLIN-R) infusion 4.3 Units/hr (07/16/15 1400)  . lactated ringers 20 mL/hr at 07/13/2015 2200  . lactated ringers 20 mL/hr at 07/09/2015 2200  . lidocaine Stopped (07/16/15 0800)  . midazolam (VERSED) infusion 1 mg/hr (07/16/15 1400)  . milrinone 0.3 mcg/kg/min (07/16/15 1458)  . nitroGLYCERIN    . norepinephrine (LEVOPHED) Adult infusion 10 mcg/min (07/16/15 1400)  . phenylephrine (NEO-SYNEPHRINE) Adult infusion    . dialysis replacement fluid (prismasate) 400 mL/hr at 07/16/15 1230  . dialysis replacement fluid (prismasate) 200 mL/hr at 07/16/15 1230  . dialysate (PRISMASATE) 1,500 mL/hr at 07/16/15 1230  . vasopressin (PITRESSIN) infusion - *FOR SHOCK* 0.04 Units/min (07/16/15 1455)   PRN Meds:.sodium chloride, albumin human, fentaNYL, fentaNYL (SUBLIMAZE) injection, heparin, lactated ringers, metoprolol, midazolam,  midazolam, midazolam, ondansetron (ZOFRAN) IV, oxyCODONE, sodium chloride, sodium chloride flush, traMADol  Assessment/Plan:  Principal Problem:   NSTEMI (non-ST elevated myocardial infarction) ([place]) Active Problems:   Type 2 diabetes mellitus with renal manifestations (HCC)   Thrombocytopenia (HCC)   Ischemic cardiomyopathy   CAD s/p CABG 2001, prior POBA, PCI/DES July 2016   Chronic combined systolic and diastolic heart failure (HCC)   Hypertensive heart disease with heart failure (HCC)   ESRD (end stage renal disease) (HCC)   Dyslipidemia, goal LDL below 70   Qualitative platelet defects (HCC)   Hepatosplenomegaly   Anemia of chronic disease   CAD (coronary artery disease)  70 year old with redo bypass surgery complicated by postoperative bleeding taken back to  operating room. Thrombocytopenia chronic, end-stage renal disease.  Currently intubated on pressor support receiving CVVHD  Critically ill. Critical care team, nephrology, hematology participating in his care.  Candee Furbish 07/16/2015, 3:05 PM

## 2015-07-16 NOTE — Progress Notes (Signed)
RT NOTE:  RT assisted OR with transport of patient back to 2S.

## 2015-07-16 NOTE — Anesthesia Postprocedure Evaluation (Signed)
Anesthesia Post Note  Patient: Joseph [name]  Procedure(s) Performed: Procedure(s) (LRB): EXPLORATION POST OPERATIVE OPEN HEART (N/A)  Patient location during evaluation: ICU Anesthesia Type: General Level of consciousness: sedated Pain management: pain level controlled Vital Signs Assessment: post-procedure vital signs reviewed and stable Respiratory status: patient remains intubated per anesthesia plan Cardiovascular status: stable Anesthetic complications: no    Last Vitals:  Filed Vitals:   07/16/15 0715 07/16/15 0754  BP:  111/37  Pulse: 91 91  Temp: 37.5 C   Resp: 35 35    Last Pain:  Filed Vitals:   07/16/15 0801  PainSc: 0-No pain                 Effie Berkshire

## 2015-07-16 NOTE — Transfer of Care (Signed)
Immediate Anesthesia Transfer of Care Note  Patient: Joseph [name]  Procedure(s) Performed: Procedure(s): EXPLORATION POST OPERATIVE OPEN HEART (N/A)  Patient Location: SICU  Anesthesia Type:General  Level of Consciousness: Patient remains intubated per anesthesia plan  Airway & Oxygen Therapy: Patient remains intubated per anesthesia plan and Patient placed on Ventilator (see vital sign flow sheet for setting)  Post-op Assessment: Report given to RN and Post -op Vital signs reviewed and stable  Post vital signs: Reviewed and stable  Last Vitals:  Filed Vitals:   08/04/2015 2145 07/23/2015 2200  BP: 81/42 76/35  Pulse: 89 89  Temp: 33.7 C 33.6 C  Resp: 18 22    Last Pain:  Filed Vitals:   07/25/2015 2310  PainSc: 0-No pain      Patients Stated Pain Goal: 0 ([id number])  Complications: No apparent anesthesia complications

## 2015-07-16 NOTE — Consult Note (Signed)
PULMONARY / CRITICAL CARE MEDICINE   Name: Joseph [name] MRN: [id number] DOB: [birth year]    ADMISSION DATE:  06/23/2015 CONSULTATION DATE:  07/16/2015  REFERRING MD:  Nils Pyle  CHIEF COMPLAINT:  Post cardiothoracic surgery ventilator management  HISTORY OF PRESENT ILLNESS:   70 y/o male with ESRD and CAD was admitted on 5/29 with NSTEMI and had persistent chest pain during his hospitalization so he was taken for a redo CABG on 6/8 (SVG to diag and SVG to OM, IABP placement and CVL placement).  Post operatively he had excessive bleeding from all chest drains (pleural and mediastinal) so he was taken back to the OR on 6/9 for re-exploration.  PCCM was consulted for ventilator management.  His cardiac history is extensive.  He had a CABG in 2001, then a drug eluding stent in 2016 but a LHC in January 2017 showed in stent re-stenosis.  He also has a history of ESRD and DM2.  04/2015 LVEF 40-45%.  His bleeding this evening has been extensive.  He has required 19 units of PRBC, 9 Units of FFP, 6 units of crytoprecipitate, and 5 units of Platelets.  PAST MEDICAL HISTORY :  He  has a past medical history of Hypertensive heart disease; Hypercholesterolemia; Prostate cancer ([place]); Hepatosplenomegaly (2015); Thrombocytopenia ([place]) (2015); Depression with anxiety; IDDM (insulin dependent diabetes mellitus) ([place]); GERD (gastroesophageal reflux disease); History of shingles; Ischemic cardiomyopathy; CKD (chronic kidney disease), stage IV ([place]); Coronary artery disease, occlusive; Chronic combined systolic and diastolic CHF, NYHA class 2 ([place]); Gallstone pancreatitis (2015); and ESRD (end stage renal disease) ([place]).  PAST SURGICAL HISTORY: He  has past surgical history that includes Cardiac surgery; Shoulder arthroscopy; Penile prosthesis implant; Coronary angioplasty; Coronary artery bypass graft (2001); Colonoscopy; Liver biopsy; AV fistula placement (Left, 2015); Cardiac catheterization (N/A, 07/30/2014);  Cardiac catheterization (N/A, 08/07/2014); Cardiac catheterization (N/A, 03/09/2015); transthoracic echocardiogram (07/2014-04/2015); and Cardiac catheterization (N/A, 07/01/2015).  Allergies  Allergen Reactions  . Sulfa Antibiotics Hives and Itching  . Morphine And Related     Makes patient feel weird    No current facility-administered medications on file prior to encounter.   Current Outpatient Prescriptions on File Prior to Encounter  Medication Sig  . aspirin EC 81 MG tablet Take 81 mg by mouth daily.  . calcium acetate (PHOSLO) 667 MG tablet Take 1,334 mg by mouth 3 (three) times daily with meals.   . carvedilol (COREG) 6.25 MG tablet Take 1 tablet (6.25 mg total) by mouth 2 (two) times daily with a meal.  . furosemide (LASIX) 80 MG tablet Take 80 mg by mouth as needed for fluid.   . isosorbide mononitrate (IMDUR) 60 MG 24 hr tablet Take 60 mg by mouth daily.   [name] LANTUS SOLOSTAR 100 UNIT/ML Solostar Pen Inject 20 Units into the skin daily at 10 pm.   . lidocaine-prilocaine (EMLA) cream Apply 1 application topically Every Tuesday,Thursday,and Saturday with dialysis.   [name] nitroGLYCERIN (NITROLINGUAL) 0.4 MG/SPRAY spray Place 1 spray under the tongue every 5 (five) minutes x 3 doses as needed for chest pain. Do not use together with sublingual nitro  . NOVOLOG MIX 70/30 FLEXPEN (70-30) 100 UNIT/ML FlexPen Inject 25 Units into the skin daily with breakfast.   . pregabalin (LYRICA) 75 MG capsule Take 1 capsule (75 mg total) by mouth daily. (Patient taking differently: Take 75 mg by mouth 2 (two) times daily. )  . ranolazine (RANEXA) 500 MG 12 hr tablet Take 1 tablet (500 mg total) by mouth 2 (two)  times daily.  . rosuvastatin (CRESTOR) 20 MG tablet Take 1 tablet (20 mg total) by mouth at bedtime.  . ticagrelor (BRILINTA) 90 MG TABS tablet Take 1 tablet (90 mg total) by mouth 2 (two) times daily.    FAMILY HISTORY:  His indicated that his mother is deceased. He indicated that his father is  deceased. He indicated that his brother is deceased. He indicated that his maternal grandmother is deceased. He indicated that his maternal grandfather is deceased. He indicated that his paternal grandmother is deceased. He indicated that his paternal grandfather is deceased.   SOCIAL HISTORY: He  reports that he quit smoking about 7 years ago. He has quit using smokeless tobacco. He reports that he does not drink alcohol or use illicit drugs.  REVIEW OF SYSTEMS:   Cannot obtain due to intubation  SUBJECTIVE:  As above  VITAL SIGNS: BP 76/35 mmHg  Pulse 89  Temp(Src) 92.5 F (33.6 C) (Core (Comment))  Resp 22  Ht 5\' 11"  (1.803 m)  Wt 93.9 kg (207 lb 0.2 oz)  BMI 28.89 kg/m2  SpO2 100%  HEMODYNAMICS: PAP: (24-48)/(13-29) 32/19 mmHg CVP:  [9 mmHg-15 mmHg] 11 mmHg CO:  [5.2 L/min] 5.2 L/min CI:  [2.4 L/min/m2] 2.4 L/min/m2  VENTILATOR SETTINGS: Vent Mode:  [-] SIMV/PC/PS FiO2 (%):  [100 %] 100 % Set Rate:  [12 bmp-20 bmp] 20 bmp Vt Set:  [600 mL] 600 mL PEEP:  [5 cmH20-8 cmH20] 8 cmH20 Pressure Support:  [10 cmH20] 10 cmH20 Plateau Pressure:  [22 cmH20] 22 cmH20  INTAKE / OUTPUT: I/O last 3 completed shifts: In: 21256.7 [P.O.:300; I.V.:7363.7; Blood:12093; IV Piggyback:1500] Out: 23280 [Urine:280; Other:2000; Blood:21000]  PHYSICAL EXAMINATION: General:  On vent Neuro:  Heavily sedated on vent HEENT:  Diffuse facial edema, ETT in place Cardiovascular:  Balloon pump, median sternotomy without bleeding Lungs:  Rhonchi bilaterally Abdomen:  Distended, some focal superficial bruising likely from injection Musculoskeletal:  No bony abnormalities Skin:  Bruising all over extensor surfaces and belly as noted above  LABS:  BMET  Recent Labs Lab 07/10/15 1209 07/13/15 0650 07/23/2015 0421  08/02/2015 1507 07/27/2015 1627 07/16/2015 1914 07/09/2015 2037  NA 135 136 137  < > 138 141 142 146*  K 3.5 4.3 3.9  < > 4.8 4.0 3.6 3.6  CL 96* 103 100*  < > 100* 99* 107  --   CO2  29 23 30   --   --   --   --   --   BUN 18 61* 24*  < > 27* 28* 24*  --   CREATININE 3.32* 6.63* 4.24*  < > 3.10* 3.00* 3.00*  --   GLUCOSE 222* 113* 188*  < > 282* 273* 258* 230*  < > = values in this interval not displayed.  Electrolytes  Recent Labs Lab 07/10/15 0736 07/10/15 1209 07/13/15 0650 07/14/2015 0421  CALCIUM 8.2* 8.1* 8.5* 8.8*  PHOS 6.1* 2.8 5.6*  --     CBC  Recent Labs Lab 07/13/2015 0421  07/14/2015 1929 08/01/2015 2037 07/08/2015 2050 07/20/2015 2105  WBC 7.5  --  6.4  --   --  11.7*  HGB 8.8*  < > 5.1* 7.8*  --  10.4*  HCT 28.0*  < > 16.0* 23.0*  --  31.3*  PLT 70*  < > 29*  --  45* 46*  < > = values in this interval not displayed.  Coag's  Recent Labs Lab 07/09/2015 1723 07/20/2015 1929 07/14/2015 2050  APTT 105*  142* 94*  INR 1.54* 1.60* 1.43    Sepsis Markers No results for input(s): LATICACIDVEN, PROCALCITON, O2SATVEN in the last 168 hours.  ABG  Recent Labs Lab 07/16/2015 1624 08/01/2015 1915 07/14/2015 2114  PHART 7.293* 7.132* 7.317*  PCO2ART 46.0* 62.5* 43.5  PO2ART 230.0* 59.0* 232.0*    Liver Enzymes  Recent Labs Lab 07/10/15 1209 07/13/15 0650 07/14/15 0927  AST  --   --  28  ALT  --   --  26  ALKPHOS  --   --  74  BILITOT  --   --  0.9  ALBUMIN 3.7 3.4* 3.7    Cardiac Enzymes  Recent Labs Lab 07/13/15 0040 07/13/15 0650 07/14/15 2330  TROPONINI 0.07* 0.08* 0.15*    Glucose  Recent Labs Lab 07/13/15 1136 07/13/15 1951 07/14/15 0722 07/14/15 1122 07/14/15 2003 07/28/2015 2151  GLUCAP 164* 106* 117* 155* 218* 195*    Imaging Dg Chest Port 1 View  07/18/2015  CLINICAL DATA:  Status post CABG x2. EXAM: PORTABLE CHEST 1 VIEW COMPARISON:  Chest x-ray from earlier same day. FINDINGS: Cardiomediastinal silhouette appears stable in size and configuration. Endotracheal tube is well positioned with tip approximately 2.5 cm above the level of the carina. Chest tubes and mediastinal drains appear stable in position. Swan-Ganz  catheter appears stable in position with tip in the midline. Nasogastric tube passes below the diaphragm. There is mild central pulmonary vascular congestion without overt alveolar pulmonary edema. Probable mild atelectasis again appreciated at the left lung base, and probable small left pleural effusion. No new lung findings. IMPRESSION: 1. Endotracheal tube now appears well positioned with tip just above the level of the carina. 2. Remainder of the support apparatus appears stable in position. 3. Mild central pulmonary vascular congestion without overt pulmonary edema. 4. Probable stable atelectasis and/or small effusion at the left lung base. Electronically Signed   By: Franki Cabot M.D.   On: 07/28/2015 20:00   Dg Chest Portable 1 View  07/12/2015  CLINICAL DATA:  70 year old male status post CABG EXAM: PORTABLE CHEST 1 VIEW COMPARISON:  Chest radiograph dated 07/14/2015 FINDINGS: Single portable view of the chest. An endotracheal tube is noted with tip approximately 6.5 cm above the carina. Bilateral chest tubes seen the tip of the right chest tube is superimposed over the right posterior sixth rib and the tip of the left chest tube is located in the left upper lung field superimposed over the left posterior fifth rib. A third chest tube is seen at the level of the left hemidiaphragm. A Swan-Ganz catheter with tip at the level of the pulmonary artery. An inferiorly placed catheter extend upward with tip superimposed over the descending thoracic aorta. This may represent an aortic balloon pump. A scope is seen over the mediastinum. There patchy areas of opacity predominantly involving the left lung which may represent atelectatic changes or infiltrate. There is diffuse increased interstitial markings on the right without focal consolidation. Trace pleural effusion may be present on the right. The left costophrenic angle has been excluded from the image. There is no pneumothorax. There is mild enlargement of  the cardiopericardial silhouette. Multiple surgical clips noted in the soft tissues of the right upper chest wall. No acute osseous pathology identified. IMPRESSION: Support lines and tubes as described. Airspace opacity predominantly involving the left lung are more pronounced in the left mid and lower lung field may represent atelectasis versus infiltrate. Clinical correlation and follow-up recommended. No pneumothorax. Mild cardiopericardial enlargement. Electronically Signed  By: Anner Crete M.D.   On: 07/19/2015 19:03     STUDIES:  6/8 TEE >  CULTURES:   ANTIBIOTICS: 6/8 Vanc periop 6/8 cefuroxime periop   SIGNIFICANT EVENTS: 6/8 Redo CABG 6/8 take back for bleeding, diffuse bleeding all over  LINES/TUBES: Left forearm fistula 6/8 R IJ introducer > 6/8 Multiple chest tubes (mediastinal and pleural bilateral)  6/8 CVC left femoral vein >  6/8 R radial arterial line >  6/8 Swan Ganz >   DISCUSSION: 70 y/o male s/p redo CABG 6/8 complicated by significant post operative bleeding in the setting of thrombocytopenia brought back to the Surgical ICU on 6/9 with hypoxemic respiratory failure.  He has baseline ESRD and DM2.  ASSESSMENT / PLAN:  PULMONARY A: Acute respiratory failure with hypoxemia> Pulm edema, at risk for TRALI or TACO Post operative pleural bleeding Respiratory acidosis> suspect mostly V/Q mismatch from cardiogenic shock P:   ABG now Continue high vent settings Cicastracurium overnight, wean off if PaO2 to FiO2 ratio improving Maintain PEEP 10, wean FiO2 to 60% if Able, watch SaO2 and maintain > 90% Repeat ABG in AM Increase RR to 35 CXR in AM Chest tubes per thoracic  CARDIOVASCULAR A:  Cardiogenic shock CAD s/p redo CABG P:  Vasopressors per TCTS IABP per cardiology/TCTS Tele  RENAL A:   ESRD P:   Will need volume removal as soon as pressure tolerates Renal following Has trauma cath in left groin which could  oxgyen  GASTROINTESTINAL A:   No acute issues P:   OG tube H2 blocker for stress ulcer prophylaxis  HEMATOLOGIC A:   Diffuse oozing due to coagulopathy from blood loss, thrombocytopenia P:  Transfuse per TCTS guidelines overnight  INFECTIOUS A:   No evidence of infection P:   Respiratory culture  ENDOCRINE A:   DM2   P:   Monitor glucose Insulin gtt per TCTS post op protocol  NEUROLOGIC A:   Sedation needs for vent synchrony P:   RASS goal: -5 Fentanyl gtt  Versed gtt Nimbex > all sedation and paralytic per protocol (BIS monitoring, train of four)  FAMILY  - Updates: updated by Dr. Prescott Gum  - Inter-disciplinary family meet or Palliative Care meeting due by:  day 7   My cc time 50 minutes  Roselie Awkward, MD [place] [facility] Pager: [phone] Cell: [phone] After 3pm or if no response, call [phone]     07/16/2015, 12:28 AM

## 2015-07-16 NOTE — Progress Notes (Signed)
PCCM Attending Note:  Nurse contacted regarding worsening PaO2 on ABG. Patient's acidosis slowly improving & stable. PEEP increased to 14cm H2O & FiO2 increased to 0.7. Peak pressure 34 cm H2O & Plateau pressure 30 cm H2O now. Plan for repeat ABG in 30 minutes. Patient's status remains critical. Plan to begin CVVHD & hopefully diurese. RN to notify me when daughter returns to provide update.  Sonia Baller Ashok Cordia, M.D. [facility] Pulmonary & Critical Care Pager:  [phone] After 3pm or if no response, call [phone] 10:16 AM 07/16/2015

## 2015-07-16 NOTE — Progress Notes (Signed)
1 Day Post-Op Procedure(s) (LRB): EXPLORATION POST OPERATIVE OPEN HEART (N/A) Subjective: Patient intubated sedated on 12 cm PEEP with intra-aortic balloon pump and pressor support after urgent redo CABG for unstable angina with ischemic cardiomyopathy, chronic renal failure on dialysis, and severe thrombocytopenia.  Hemodynamics improved overnight after bleeding controlled after mediastinal reexploration. Patient had massive transfusion requirements for coagulopathy-platelet count 12,000, fibrinogen 46. He now has 5 overload and was started on CVVH. However patient has vasa dilated and the volume shift associated CVVH resulted in hypotension and CVVH was placed on hold until hemodynamics become stable.    Objective: Vital signs in last 24 hours: Temp:  [92.5 F (33.6 C)-99.7 F (37.6 C)] 99.3 F (37.4 C) (06/09 1800) Pulse Rate:  [35-114] 114 (06/09 1800) Cardiac Rhythm:  [-] A-V Sequential paced (06/09 1000) Resp:  [0-35] 35 (06/09 1800) BP: (59-129)/(35-72) 77/46 mmHg (06/09 1700) SpO2:  [88 %-100 %] 96 % (06/09 1800) Arterial Line BP: (59-151)/(26-55) 89/48 mmHg (06/09 1800) FiO2 (%):  [60 %-100 %] 70 % (06/09 1700) Weight:  [263 lb 7.2 oz (119.5 kg)] 263 lb 7.2 oz (119.5 kg) (06/09 0645)  Hemodynamic parameters for last 24 hours: PAP: (24-62)/(13-35) 38/29 mmHg CVP:  [9 mmHg-19 mmHg] 18 mmHg CO:  [5.2 L/min-8.1 L/min] 8 L/min CI:  [2.4 L/min/m2-3.8 L/min/m2] 3.8 L/min/m2  Intake/Output from previous day: 06/08 0701 - 06/09 0700 In: 29132.7 [I.V.:9683.7; AQ:[id number]; NG/GT:30; IV Piggyback:2750] Out: UP:[id number] [Urine:455; FM:1262563; Chest Tube:3290] Intake/Output this shift: Total I/O In: 1835.1 [I.V.:1400.1; Blood:335; IV Piggyback:100] Out: 1000 [Urine:117; Emesis/NG output:150; VY:[id number]; Chest Tube:500]  Sedated on ventilator Breath sounds fairly clear Moderate peripheral edema Abdomen soft Extremities well-perfused Balloon pump in place  Lab Results:  Recent  Labs  07/16/15 0907  07/16/15 1313 07/16/15 1507  WBC 7.6  --   --  12.1*  HGB 9.8*  < > 9.5* 9.8*  HCT 29.0*  < > 28.0* 28.6*  PLT 50*  --   --  52*  < > = values in this interval not displayed. BMET:  Recent Labs  07/16/15 0520  07/16/15 1313 07/16/15 1507  NA 142  < > 144 142  K 4.2  < > 4.7 4.9  CL 108  < > 105 111  CO2 23  --   --  19*  GLUCOSE 139*  < > 103* 116*  BUN 21*  < > 24* 22*  CREATININE 3.59*  < > 3.50* 3.77*  3.72*  CALCIUM 9.0  --   --  8.2*  < > = values in this interval not displayed.  PT/INR:  Recent Labs  07/16/15 0053  LABPROT 13.9  INR 1.05   ABG    Component Value Date/Time   PHART 7.284* 07/16/2015 1434   HCO3 21.5 07/16/2015 1434   TCO2 23 07/16/2015 1434   ACIDBASEDEF 5.0* 07/16/2015 1434   O2SAT 89.0 07/16/2015 1434   CBG (last 3)   Recent Labs  07/16/15 0434 07/16/15 0522 07/16/15 0643  GLUCAP 165* 143* 123*    Assessment/Plan: S/P Procedure(s) (LRB): EXPLORATION POST OPERATIVE OPEN HEART (N/A) Continue to provide multisystem support after receiving massive transfusion for severe coagulopathy associated with urgent redo CABG   LOS: 10 days    Joseph [name] 07/16/2015

## 2015-07-16 NOTE — Transfer of Care (Signed)
Immediate Anesthesia Transfer of Care Note  Patient: Joseph [name]  Procedure(s) Performed: Procedure(s): EXPLORATION POST OPERATIVE OPEN HEART (N/A)  Patient Location: SICU  Anesthesia Type:General  Level of Consciousness: Patient remains intubated per anesthesia plan  Airway & Oxygen Therapy: Patient remains intubated per anesthesia plan and Patient placed on Ventilator (see vital sign flow sheet for setting)  Post-op Assessment: Report given to RN and Post -op Vital signs reviewed and stable  Post vital signs: Reviewed and stable  Last Vitals:  Filed Vitals:   07/09/2015 2145 07/23/2015 2200  BP: 81/42 76/35  Pulse: 89 89  Temp: 33.7 C 33.6 C  Resp: 18 22    Last Pain:  Filed Vitals:   08/02/2015 2310  PainSc: 0-No pain      Patients Stated Pain Goal: 0 ([id number])  Complications: No apparent anesthesia complications

## 2015-07-16 NOTE — Progress Notes (Signed)
Patient unable to tolerate CRRT, systolic pressure [id number], Dr. Prescott Gum made aware and gave verbal order to stop CRRT and change vent PEEP from 14 to 12. Will continue to monitor.  Rowe Pavy, RN

## 2015-07-16 NOTE — Progress Notes (Signed)
Initial Nutrition Assessment  DOCUMENTATION CODES:   Obesity unspecified  INTERVENTION:    If TF recommend, Nepro formula at goal rate of 20 ml/h (480 ml per day) and Prostat 60 ml QID to provide 1664 kcals, 159 gm protein, 349 ml free water daily.  NUTRITION DIAGNOSIS:   Inadequate oral intake related to inability to eat as evidenced by NPO status  GOAL:   Provide needs based on ASPEN/SCCM guidelines  MONITOR:   Vent status, Labs, Weight trends, I & O's  REASON FOR ASSESSMENT:   Ventilator  ASSESSMENT:   70 y/o Male with ESRD and CAD was admitted on 5/29 with NSTEMI and had persistent chest pain during his hospitalization so he was taken for a redo CABG on 6/8 (SVG to diag and SVG to OM, IABP placement and CVL placement). Post operatively he had excessive bleeding from all chest drains (pleural and mediastinal) so he was taken back to the OR on 6/9 for re-exploration. PCCM was consulted for ventilator management.  Patient s/p procedure 6/8: EXPLORATION POST OPERATIVE OPEN HEART   Patient is currently intubated on ventilator support >> OGT in place Temp (24hrs), Avg:97.3 F (36.3 C), Min:92.5 F (33.6 C), Max:99.7 F (37.6 C)   CCM & Nephrology notes reviewed. Pt had significant post-op bleeding in setting of thrombocytopenia.  Pulmonary edema >> at risk for TRALI or TACO. Plan to being CVVHD.  Nutrition focused physical exam completed.  No muscle or subcutaneous fat depletion noticed.  Diet Order:  Diet NPO time specified  Skin:  Reviewed, no issues  Last BM:  6/7  Height:   Ht Readings from Last 1 Encounters:  07/04/2015 5\' 11"  (1.803 m)    Weight:   Wt Readings from Last 1 Encounters:  07/16/15 263 lb 7.2 oz (119.5 kg)    Ideal Body Weight:  78 kg  BMI:  Body mass index is 36.76 kg/(m^2).  Estimated Nutritional Needs:   Kcal:  [id number]  Protein:  155-165 gm  Fluid:  per MD  EDUCATION NEEDS:   No education needs identified at this  time  Arthur Holms, RD, LDN Pager #: [phone] After-Hours Pager #: [phone]

## 2015-07-16 NOTE — Progress Notes (Signed)
Dr. Ashok Cordia notified of ABG results, no new orders, Dr. Ashok Cordia to come by at the bedside.  Rowe Pavy, RN

## 2015-07-16 NOTE — Progress Notes (Signed)
S:  Intubated sedated.  Events of yest noted  Required being taken back to OR for bleeding.  Now on 3 pressors O:BP 119/38 mmHg  Pulse 91  Temp(Src) 99.5 F (37.5 C) (Core (Comment))  Resp 35  Ht 5\' 11"  (1.803 m)  Wt 119.5 kg (263 lb 7.2 oz)  BMI 36.76 kg/m2  SpO2 96%  Intake/Output Summary (Last 24 hours) at 07/16/15 09/15/15 Last data filed at 07/16/15 0700  Gross per 24 hour  Intake 29132.69 ml  Output  09/15/15 ml  Net 4037.69 ml   Weight change: 24 kg (52 lb 14.6 oz) Gen: intubated, sedated CVS: RRR    Resp: Decreased BS bases Abd: rare BS, + distention  + chest tubes Ext: + edema,   Lt forearm AVF + bruit NEURO: Sedated Aortic balloon pump Lt fem HD cath   . sodium chloride   Intravenous Once  . sodium chloride   Intravenous Once  . sodium chloride   Intravenous Once  . sodium chloride   Intravenous Once  . acetaminophen  1,000 mg Oral Q6H   Or  . acetaminophen (TYLENOL) oral liquid 160 mg/5 mL  1,000 mg Per Tube Q6H  . antiseptic oral rinse  7 mL Mouth Rinse QID  . artificial tears  1 application Both Eyes Q8H  . bisacodyl  10 mg Oral Daily   Or  . bisacodyl  10 mg Rectal Daily  . calcium acetate  1,334 mg Oral TID WC  . cefUROXime (ZINACEF)  IV  1.5 g Intravenous Q12H  . chlorhexidine gluconate (SAGE KIT)  15 mL Mouth Rinse BID  . cisatracurium  0.05 mg/kg Intravenous Once  . coagulation factor VIIa recomb  60 mcg/kg Intravenous Once  . darbepoetin (ARANESP) injection - DIALYSIS  60 mcg Intravenous Q Tue-HD  . docusate sodium  200 mg Oral Daily  . famotidine (PEPCID) IV  20 mg Intravenous Q12H  . fentaNYL (SUBLIMAZE) injection  100 mcg Intravenous Once  . insulin regular  0-10 Units Intravenous TID WC  . magic mouthwash  10 mL Oral TID  . magnesium sulfate 1 - 4 g bolus IVPB  2 g Intravenous Once  . magnesium sulfate  4 g Intravenous Once  . metoprolol tartrate  12.5 mg Oral BID   Or  . metoprolol tartrate  12.5 mg Per Tube BID  . midazolam  2 mg Intravenous  Once  . multivitamin  1 tablet Oral QHS  . [START ON 07/17/2015] pantoprazole  40 mg Oral Daily  . pregabalin  75 mg Oral BID  . rosuvastatin  20 mg Oral QHS  . sodium chloride flush  3 mL Intravenous Q12H  . vancomycin  1,000 mg Intravenous Once   Dg Chest 2 View  07/14/2015  CLINICAL DATA:  Preoperative assessment for CABG, history coronary disease post prior CABG, former smoker, hypertensive heart disease, prostate cancer, insulin-dependent diabetes mellitus, ischemic cardiomyopathy, chronic combined systolic and diastolic CHF, end-stage renal disease, former smoker EXAM: CHEST  2 VIEW COMPARISON:  06/22/2015 FINDINGS: Enlargement of cardiac silhouette post CABG. Mediastinal contours and pulmonary vascularity normal. Lungs clear. No pulmonary infiltrate, pleural effusion or pneumothorax. Degenerative disc disease changes thoracic spine without acute bony abnormalities. IMPRESSION: Enlargement of cardiac silhouette post CABG. No acute abnormalities. Electronically Signed   By: 06/21/2015 M.D.   On: 07/14/2015 10:00   Dg Chest Port 1 View  07/16/2015  CLINICAL DATA:  CABG EXAM: PORTABLE CHEST 1 VIEW COMPARISON:  Chest radiograph from earlier today. FINDINGS:  Endotracheal tube tip is approximately 3.8 cm above the carina. Enteric tube enters stomach with the tip not seen on this image. Bilateral chest tubes, mediastinal drains, midline and right shoulder skin staples are stable in configuration. Left internal jugular Swan-Ganz catheter terminates in the main pulmonary artery. Intra-aortic balloon pump marker is at the T3-4 level overlying the very proximal descending aorta. Stable cardiomediastinal silhouette with moderate cardiomegaly. No pneumothorax. Stable trace bilateral pleural effusions. Mild-to-moderate pulmonary edema appears minimally improved. Stable low lung volumes with bilateral lower lobe atelectasis. IMPRESSION: 1. No pneumothorax. 2. Mild-to-moderate congestive heart failure, slightly  improved. 3. Low lung volumes with bilateral lower lobe atelectasis, unchanged. 4. Intra-aortic balloon pump marker is at the T3-4 level overlying the very proximal descending aorta. Electronically Signed   By: Ilona Sorrel M.D.   On: 07/16/2015 07:52   Dg Chest Port 1 View  07/16/2015  CLINICAL DATA:  Shortness of breath.  Recent re-exploration. EXAM: PORTABLE CHEST 1 VIEW COMPARISON:  Yesterday FINDINGS: Endotracheal tube tip is at the clavicular heads. Swan-Ganz catheter from left IJ approach directed into the right pulmonary artery. Aortic balloon pump with tip overlapping the expected location of the arch. Mildly higher appearance of the central thoracic drain. Other drains appears similar to prior. Nasogastric tube tip at least reaches the stomach. Cardiopericardial enlargement that is similar to prior when allowing for lower volumes. Perihilar atelectasis. Layering left effusion. Probable edema. No pneumothorax. IMPRESSION: 1. Similar positioning of tubes and central line. 2. Unchanged cardiopericardial enlargement. 3. Lower volumes with increased atelectasis or layering fluid on the left. Probable edema. Electronically Signed   By: Monte Fantasia M.D.   On: 07/16/2015 00:33   Dg Chest Port 1 View  08/06/2015  CLINICAL DATA:  Status post CABG x2. EXAM: PORTABLE CHEST 1 VIEW COMPARISON:  Chest x-ray from earlier same day. FINDINGS: Cardiomediastinal silhouette appears stable in size and configuration. Endotracheal tube is well positioned with tip approximately 2.5 cm above the level of the carina. Chest tubes and mediastinal drains appear stable in position. Swan-Ganz catheter appears stable in position with tip in the midline. Nasogastric tube passes below the diaphragm. There is mild central pulmonary vascular congestion without overt alveolar pulmonary edema. Probable mild atelectasis again appreciated at the left lung base, and probable small left pleural effusion. No new lung findings. IMPRESSION: 1.  Endotracheal tube now appears well positioned with tip just above the level of the carina. 2. Remainder of the support apparatus appears stable in position. 3. Mild central pulmonary vascular congestion without overt pulmonary edema. 4. Probable stable atelectasis and/or small effusion at the left lung base. Electronically Signed   By: Franki Cabot M.D.   On: 08/06/2015 20:00   Dg Chest Portable 1 View  07/14/2015  CLINICAL DATA:  70 year old male status post CABG EXAM: PORTABLE CHEST 1 VIEW COMPARISON:  Chest radiograph dated 07/14/2015 FINDINGS: Single portable view of the chest. An endotracheal tube is noted with tip approximately 6.5 cm above the carina. Bilateral chest tubes seen the tip of the right chest tube is superimposed over the right posterior sixth rib and the tip of the left chest tube is located in the left upper lung field superimposed over the left posterior fifth rib. A third chest tube is seen at the level of the left hemidiaphragm. A Swan-Ganz catheter with tip at the level of the pulmonary artery. An inferiorly placed catheter extend upward with tip superimposed over the descending thoracic aorta. This may represent an aortic balloon  pump. A scope is seen over the mediastinum. There patchy areas of opacity predominantly involving the left lung which may represent atelectatic changes or infiltrate. There is diffuse increased interstitial markings on the right without focal consolidation. Trace pleural effusion may be present on the right. The left costophrenic angle has been excluded from the image. There is no pneumothorax. There is mild enlargement of the cardiopericardial silhouette. Multiple surgical clips noted in the soft tissues of the right upper chest wall. No acute osseous pathology identified. IMPRESSION: Support lines and tubes as described. Airspace opacity predominantly involving the left lung are more pronounced in the left mid and lower lung field may represent atelectasis  versus infiltrate. Clinical correlation and follow-up recommended. No pneumothorax. Mild cardiopericardial enlargement. Electronically Signed   By: Anner Crete M.D.   On: 07/09/2015 19:03   BMET    Component Value Date/Time   NA 142 07/16/2015 0520   NA 143 09/11/2014 1549   NA 143 01/05/2014 1334   NA 139 02/04/2008 1320   K 4.2 07/16/2015 0520   K 5.5* 01/05/2014 1334   K 4.3 02/04/2008 1320   CL 108 07/16/2015 0520   CL 112* 01/05/2014 1334   CL 102 02/04/2008 1320   CO2 23 07/16/2015 0520   CO2 21 01/05/2014 1334   CO2 28 02/04/2008 1320   GLUCOSE 139* 07/16/2015 0520   GLUCOSE 200* 09/11/2014 1549   GLUCOSE 104* 01/05/2014 1334   GLUCOSE 240* 02/04/2008 1320   BUN 21* 07/16/2015 0520   BUN 57* 09/11/2014 1549   BUN 88* 01/05/2014 1334   BUN 18 02/04/2008 1320   CREATININE 3.59* 07/16/2015 0520   CREATININE 3.52* 05/26/2014 1024   CREATININE 1.3* 02/04/2008 1320   CALCIUM 9.0 07/16/2015 0520   CALCIUM 7.5* 11/06/2014 0421   CALCIUM 6.9* 05/26/2014 1024   CALCIUM 8.8 02/04/2008 1320   GFRNONAA 16* 07/16/2015 0520   GFRNONAA 17* 05/26/2014 1024   GFRNONAA 19* 01/05/2014 1334   GFRAA 19* 07/16/2015 0520   GFRAA 20* 05/26/2014 1024   GFRAA 23* 01/05/2014 1334   CBC    Component Value Date/Time   WBC 6.9 07/16/2015 0520   WBC 4.9 03/18/2015 1539   WBC 4.7 05/26/2014 1024   RBC 3.31* 07/16/2015 0520   RBC 2.91* 03/18/2015 1539   RBC 3.02* 11/06/2014 0421   RBC 3.61* 05/26/2014 1024   RBC 5.35 03/23/2008 1117   HGB 9.5* 07/16/2015 0520   HGB 10.2* 05/26/2014 1024   HGB 15.8 03/23/2008 1117   HCT 28.0* 07/16/2015 0520   HCT 25.8* 03/18/2015 1539   HCT 30.6* 05/26/2014 1024   HCT 46.2 03/23/2008 1117   PLT 57* 07/16/2015 0520   PLT 99* 03/18/2015 1539   PLT 80* 05/26/2014 1024   PLT 93* 03/23/2008 1117   MCV 84.6 07/16/2015 0520   MCV 89 03/18/2015 1539   MCV 85 05/26/2014 1024   MCV 86 03/23/2008 1117   MCH 28.7 07/16/2015 0520   MCH 29.2 03/18/2015  1539   MCH 28.3 05/26/2014 1024   MCH 29.5 03/23/2008 1117   MCHC 33.9 07/16/2015 0520   MCHC 32.9 03/18/2015 1539   MCHC 33.4 05/26/2014 1024   MCHC 34.1 03/23/2008 1117   RDW 14.7 07/16/2015 0520   RDW 16.8* 03/18/2015 1539   RDW 15.7* 05/26/2014 1024   RDW 12.7 03/23/2008 1117   LYMPHSABS 0.7* 05/25/2015 1343   LYMPHSABS 0.6* 09/11/2014 0000   LYMPHSABS 0.7* 05/26/2014 1024   LYMPHSABS 1.3 03/23/2008 1117  MONOABS 0.4 05/25/2015 1343   MONOABS 0.3 05/26/2014 1024   EOSABS 0.0 05/25/2015 1343   EOSABS 0.1 09/11/2014 0000   EOSABS 0.1 05/26/2014 1024   EOSABS 0.1 03/23/2008 1117   BASOSABS 0.0 05/25/2015 1343   BASOSABS 0.0 09/11/2014 0000   BASOSABS 0.0 05/26/2014 1024   BASOSABS 0.1 03/23/2008 1117     Assessment: 1. SP CABG 2. DM 3. Anemia 4. HTN 5. ESRD  TTS 6. Thrombocytopenia  Plan: 1. Will start CVVHD to at least keep even for now and prevent further total body volume overload and then pull fluid when more hemodynamically stable    Liesel Peckenpaugh T

## 2015-07-16 NOTE — Care Management Important Message (Signed)
Important Message  Patient Details  Name: Joseph [name] MRN: [id number] Date of Birth: 27-Sep-1945   Medicare Important Message Given:  Yes    [name] May 07/16/2015, 10:53 AM

## 2015-07-17 ENCOUNTER — Inpatient Hospital Stay (HOSPITAL_COMMUNITY): Payer: Medicare Other

## 2015-07-17 LAB — COMPREHENSIVE METABOLIC PANEL
ALT: 3212 U/L — ABNORMAL HIGH (ref 17–63)
AST: 3032 U/L — ABNORMAL HIGH (ref 15–41)
Albumin: 2.4 g/dL — ABNORMAL LOW (ref 3.5–5.0)
Alkaline Phosphatase: 123 U/L (ref 38–126)
Anion gap: 16 — ABNORMAL HIGH (ref 5–15)
BUN: 26 mg/dL — ABNORMAL HIGH (ref 6–20)
CO2: 19 mmol/L — ABNORMAL LOW (ref 22–32)
Calcium: 8.2 mg/dL — ABNORMAL LOW (ref 8.9–10.3)
Chloride: 106 mmol/L (ref 101–111)
Creatinine, Ser: 4.75 mg/dL — ABNORMAL HIGH (ref 0.61–1.24)
GFR calc Af Amer: 13 mL/min — ABNORMAL LOW (ref >60)
GFR calc non Af Amer: 12 mL/min — ABNORMAL LOW (ref >60)
Glucose, Bld: 163 mg/dL — ABNORMAL HIGH (ref 65–99)
Potassium: 4.1 mmol/L (ref 3.5–5.1)
Sodium: 141 mmol/L (ref 135–145)
Total Bilirubin: 3.4 mg/dL — ABNORMAL HIGH (ref 0.3–1.2)
Total Protein: 4.6 g/dL — ABNORMAL LOW (ref 6.5–8.1)

## 2015-07-17 LAB — POCT I-STAT 3, ART BLOOD GAS (G3+)
ACID-BASE DEFICIT: 8 mmol/L — AB (ref 0.0–2.0)
Acid-base deficit: 1 mmol/L (ref 0.0–2.0)
Acid-base deficit: 5 mmol/L — ABNORMAL HIGH (ref 0.0–2.0)
Acid-base deficit: 7 mmol/L — ABNORMAL HIGH (ref 0.0–2.0)
BICARBONATE: 18.8 meq/L — AB (ref 20.0–24.0)
BICARBONATE: 20.6 meq/L (ref 20.0–24.0)
BICARBONATE: 23.8 meq/L (ref 20.0–24.0)
BICARBONATE: 24.2 meq/L — AB (ref 20.0–24.0)
BICARBONATE: 24.3 meq/L — AB (ref 20.0–24.0)
Bicarbonate: 18 mEq/L — ABNORMAL LOW (ref 20.0–24.0)
O2 SAT: 92 %
O2 SAT: 96 %
O2 SAT: 99 %
O2 Saturation: 91 %
O2 Saturation: 94 %
O2 Saturation: 96 %
PCO2 ART: 41.5 mmHg (ref 35.0–45.0)
PH ART: 7.266 — AB (ref 7.350–7.450)
PH ART: 7.409 (ref 7.350–7.450)
PO2 ART: 128 mmHg — AB (ref 80.0–100.0)
PO2 ART: 72 mmHg — AB (ref 80.0–100.0)
PO2 ART: 73 mmHg — AB (ref 80.0–100.0)
PO2 ART: 78 mmHg — AB (ref 80.0–100.0)
PO2 ART: 79 mmHg — AB (ref 80.0–100.0)
TCO2: 19 mmol/L (ref 0–100)
TCO2: 20 mmol/L (ref 0–100)
TCO2: 22 mmol/L (ref 0–100)
TCO2: 25 mmol/L (ref 0–100)
TCO2: 25 mmol/L (ref 0–100)
TCO2: 25 mmol/L (ref 0–100)
pCO2 arterial: 35.3 mmHg (ref 35.0–45.0)
pCO2 arterial: 38.4 mmHg (ref 35.0–45.0)
pCO2 arterial: 38.9 mmHg (ref 35.0–45.0)
pCO2 arterial: 39.5 mmHg (ref 35.0–45.0)
pCO2 arterial: 41.8 mmHg (ref 35.0–45.0)
pH, Arterial: 7.271 — ABNORMAL LOW (ref 7.350–7.450)
pH, Arterial: 7.334 — ABNORMAL LOW (ref 7.350–7.450)
pH, Arterial: 7.366 (ref 7.350–7.450)
pH, Arterial: 7.438 (ref 7.350–7.450)
pO2, Arterial: 74 mmHg — ABNORMAL LOW (ref 80.0–100.0)

## 2015-07-17 LAB — GLUCOSE, CAPILLARY
GLUCOSE-CAPILLARY: 120 mg/dL — AB (ref 65–99)
GLUCOSE-CAPILLARY: 111 mg/dL — AB (ref 65–99)
GLUCOSE-CAPILLARY: 103 mg/dL — AB (ref 65–99)
GLUCOSE-CAPILLARY: 99 mg/dL (ref 65–99)
GLUCOSE-CAPILLARY: 100 mg/dL — AB (ref 65–99)
GLUCOSE-CAPILLARY: 119 mg/dL — AB (ref 65–99)
GLUCOSE-CAPILLARY: 115 mg/dL — AB (ref 65–99)
GLUCOSE-CAPILLARY: 122 mg/dL — AB (ref 65–99)
GLUCOSE-CAPILLARY: 143 mg/dL — AB (ref 65–99)
GLUCOSE-CAPILLARY: 157 mg/dL — AB (ref 65–99)
GLUCOSE-CAPILLARY: 147 mg/dL — AB (ref 65–99)
GLUCOSE-CAPILLARY: 147 mg/dL — AB (ref 65–99)
GLUCOSE-CAPILLARY: 138 mg/dL — AB (ref 65–99)
Glucose-Capillary: 105 mg/dL — ABNORMAL HIGH (ref 65–99)
Glucose-Capillary: 109 mg/dL — ABNORMAL HIGH (ref 65–99)
Glucose-Capillary: 111 mg/dL — ABNORMAL HIGH (ref 65–99)
Glucose-Capillary: 113 mg/dL — ABNORMAL HIGH (ref 65–99)
Glucose-Capillary: 148 mg/dL — ABNORMAL HIGH (ref 65–99)
Glucose-Capillary: 152 mg/dL — ABNORMAL HIGH (ref 65–99)
Glucose-Capillary: 147 mg/dL — ABNORMAL HIGH (ref 65–99)
Glucose-Capillary: 149 mg/dL — ABNORMAL HIGH (ref 65–99)
Glucose-Capillary: 154 mg/dL — ABNORMAL HIGH (ref 65–99)
Glucose-Capillary: 116 mg/dL — ABNORMAL HIGH (ref 65–99)
Glucose-Capillary: 119 mg/dL — ABNORMAL HIGH (ref 65–99)
Glucose-Capillary: 122 mg/dL — ABNORMAL HIGH (ref 65–99)
Glucose-Capillary: 126 mg/dL — ABNORMAL HIGH (ref 65–99)
Glucose-Capillary: 136 mg/dL — ABNORMAL HIGH (ref 65–99)
Glucose-Capillary: 161 mg/dL — ABNORMAL HIGH (ref 65–99)

## 2015-07-17 LAB — CBC
HCT: 25.5 % — ABNORMAL LOW (ref 39.0–52.0)
HCT: 23.7 % — ABNORMAL LOW (ref 39.0–52.0)
HCT: 25.8 % — ABNORMAL LOW (ref 39.0–52.0)
HCT: 27.9 % — ABNORMAL LOW (ref 39.0–52.0)
Hemoglobin: 8.5 g/dL — ABNORMAL LOW (ref 13.0–17.0)
Hemoglobin: 8 g/dL — ABNORMAL LOW (ref 13.0–17.0)
Hemoglobin: 8.7 g/dL — ABNORMAL LOW (ref 13.0–17.0)
Hemoglobin: 9.4 g/dL — ABNORMAL LOW (ref 13.0–17.0)
MCH: 28.5 pg (ref 26.0–34.0)
MCH: 28.7 pg (ref 26.0–34.0)
MCH: 28.6 pg (ref 26.0–34.0)
MCH: 28.2 pg (ref 26.0–34.0)
MCHC: 33.3 g/dL (ref 30.0–36.0)
MCHC: 33.8 g/dL (ref 30.0–36.0)
MCHC: 33.7 g/dL (ref 30.0–36.0)
MCHC: 33.7 g/dL (ref 30.0–36.0)
MCV: 85.6 fL (ref 78.0–100.0)
MCV: 84.9 fL (ref 78.0–100.0)
MCV: 84.9 fL (ref 78.0–100.0)
MCV: 83.8 fL (ref 78.0–100.0)
Platelets: 36 10*3/uL — ABNORMAL LOW (ref 150–400)
Platelets: 50 10*3/uL — ABNORMAL LOW (ref 150–400)
Platelets: 32 10*3/uL — ABNORMAL LOW (ref 150–400)
Platelets: 33 10*3/uL — ABNORMAL LOW (ref 150–400)
RBC: 2.98 MIL/uL — ABNORMAL LOW (ref 4.22–5.81)
RBC: 2.79 MIL/uL — ABNORMAL LOW (ref 4.22–5.81)
RBC: 3.04 MIL/uL — ABNORMAL LOW (ref 4.22–5.81)
RBC: 3.33 MIL/uL — ABNORMAL LOW (ref 4.22–5.81)
RDW: 16.5 % — ABNORMAL HIGH (ref 11.5–15.5)
RDW: 16.6 % — ABNORMAL HIGH (ref 11.5–15.5)
RDW: 16.2 % — ABNORMAL HIGH (ref 11.5–15.5)
RDW: 15.9 % — ABNORMAL HIGH (ref 11.5–15.5)
WBC: 13 10*3/uL — ABNORMAL HIGH (ref 4.0–10.5)
WBC: 12.3 10*3/uL — ABNORMAL HIGH (ref 4.0–10.5)
WBC: 12.3 10*3/uL — ABNORMAL HIGH (ref 4.0–10.5)
WBC: 11.4 10*3/uL — ABNORMAL HIGH (ref 4.0–10.5)

## 2015-07-17 LAB — POCT I-STAT, CHEM 8
BUN: 26 mg/dL — AB (ref 6–20)
BUN: 26 mg/dL — AB (ref 6–20)
BUN: 23 mg/dL — AB (ref 6–20)
CALCIUM ION: 1.12 mmol/L — AB (ref 1.13–1.30)
CHLORIDE: 102 mmol/L (ref 101–111)
CREATININE: 4.4 mg/dL — AB (ref 0.61–1.24)
CREATININE: 3.8 mg/dL — AB (ref 0.61–1.24)
Calcium, Ion: 1.1 mmol/L — ABNORMAL LOW (ref 1.13–1.30)
Calcium, Ion: 1.08 mmol/L — ABNORMAL LOW (ref 1.13–1.30)
Chloride: 102 mmol/L (ref 101–111)
Chloride: 101 mmol/L (ref 101–111)
Creatinine, Ser: 4.7 mg/dL — ABNORMAL HIGH (ref 0.61–1.24)
GLUCOSE: 140 mg/dL — AB (ref 65–99)
Glucose, Bld: 152 mg/dL — ABNORMAL HIGH (ref 65–99)
Glucose, Bld: 108 mg/dL — ABNORMAL HIGH (ref 65–99)
HCT: 23 % — ABNORMAL LOW (ref 39.0–52.0)
HCT: 23 % — ABNORMAL LOW (ref 39.0–52.0)
HEMATOCRIT: 28 % — AB (ref 39.0–52.0)
HEMOGLOBIN: 9.5 g/dL — AB (ref 13.0–17.0)
Hemoglobin: 7.8 g/dL — ABNORMAL LOW (ref 13.0–17.0)
Hemoglobin: 7.8 g/dL — ABNORMAL LOW (ref 13.0–17.0)
POTASSIUM: 3.8 mmol/L (ref 3.5–5.1)
POTASSIUM: 3.7 mmol/L (ref 3.5–5.1)
POTASSIUM: 3.8 mmol/L (ref 3.5–5.1)
SODIUM: 142 mmol/L (ref 135–145)
SODIUM: 142 mmol/L (ref 135–145)
Sodium: 141 mmol/L (ref 135–145)
TCO2: 22 mmol/L (ref 0–100)
TCO2: 25 mmol/L (ref 0–100)
TCO2: 24 mmol/L (ref 0–100)

## 2015-07-17 LAB — PREPARE PLATELET PHERESIS
Unit division: 0
Unit division: 0

## 2015-07-17 LAB — RENAL FUNCTION PANEL
ALBUMIN: 2.4 g/dL — AB (ref 3.5–5.0)
ANION GAP: 11 (ref 5–15)
BUN: 25 mg/dL — ABNORMAL HIGH (ref 6–20)
CHLORIDE: 105 mmol/L (ref 101–111)
CO2: 24 mmol/L (ref 22–32)
Calcium: 7.9 mg/dL — ABNORMAL LOW (ref 8.9–10.3)
Creatinine, Ser: 4.15 mg/dL — ABNORMAL HIGH (ref 0.61–1.24)
GFR, EST AFRICAN AMERICAN: 16 mL/min — AB (ref >60)
GFR, EST NON AFRICAN AMERICAN: 14 mL/min — AB (ref >60)
Glucose, Bld: 122 mg/dL — ABNORMAL HIGH (ref 65–99)
PHOSPHORUS: 4.5 mg/dL (ref 2.5–4.6)
POTASSIUM: 3.7 mmol/L (ref 3.5–5.1)
Sodium: 140 mmol/L (ref 135–145)

## 2015-07-17 LAB — HEPATIC FUNCTION PANEL
ALT: 2983 U/L — ABNORMAL HIGH (ref 17–63)
AST: 3011 U/L — ABNORMAL HIGH (ref 15–41)
Albumin: 2.5 g/dL — ABNORMAL LOW (ref 3.5–5.0)
Alkaline Phosphatase: 113 U/L (ref 38–126)
Bilirubin, Direct: 2.1 mg/dL — ABNORMAL HIGH (ref 0.1–0.5)
Indirect Bilirubin: 1.2 mg/dL — ABNORMAL HIGH (ref 0.3–0.9)
Total Bilirubin: 3.3 mg/dL — ABNORMAL HIGH (ref 0.3–1.2)
Total Protein: 4.4 g/dL — ABNORMAL LOW (ref 6.5–8.1)

## 2015-07-17 LAB — VANCOMYCIN, RANDOM: Vancomycin Rm: 26 ug/mL

## 2015-07-17 LAB — CARBOXYHEMOGLOBIN
Carboxyhemoglobin: 1.3 % (ref 0.5–1.5)
Methemoglobin: 1.1 % (ref 0.0–1.5)
O2 Saturation: 77.7 %
Total hemoglobin: 10.3 g/dL — ABNORMAL LOW (ref 13.5–18.0)

## 2015-07-17 LAB — PREPARE FRESH FROZEN PLASMA: Unit division: 0

## 2015-07-17 LAB — BILIRUBIN, DIRECT: Bilirubin, Direct: 2.1 mg/dL — ABNORMAL HIGH (ref 0.1–0.5)

## 2015-07-17 LAB — MAGNESIUM: Magnesium: 1.8 mg/dL (ref 1.7–2.4)

## 2015-07-17 LAB — PHOSPHORUS: Phosphorus: 5 mg/dL — ABNORMAL HIGH (ref 2.5–4.6)

## 2015-07-17 LAB — PREPARE RBC (CROSSMATCH)

## 2015-07-17 MED ORDER — AMIODARONE LOAD VIA INFUSION
150.0000 mg | Freq: Once | INTRAVENOUS | Status: DC
  Filled 2015-07-17: qty 83.34

## 2015-07-17 MED ORDER — AMIODARONE HCL IN DEXTROSE 360-4.14 MG/200ML-% IV SOLN
15.0000 mg/h | INTRAVENOUS | Status: DC
  Administered 2015-07-17 (×2): 30 mg/h via INTRAVENOUS
  Administered 2015-07-18 – 2015-07-19 (×5): 60 mg/h via INTRAVENOUS
  Administered 2015-07-19 – 2015-07-22 (×7): 30 mg/h via INTRAVENOUS
  Administered 2015-07-22: 15 mg/h via INTRAVENOUS
  Filled 2015-07-17 (×14): qty 200

## 2015-07-17 MED ORDER — SODIUM CHLORIDE 0.9 % IV SOLN
Freq: Once | INTRAVENOUS | Status: AC
  Administered 2015-07-17: 22:00:00 via INTRAVENOUS

## 2015-07-17 MED ORDER — ALBUMIN HUMAN 5 % IV SOLN
INTRAVENOUS | Status: AC
  Administered 2015-07-17: 12.5 g
  Filled 2015-07-17: qty 250

## 2015-07-17 MED ORDER — SODIUM CHLORIDE 0.9 % IV SOLN
1.0000 g | Freq: Once | INTRAVENOUS | Status: DC
  Filled 2015-07-17: qty 10

## 2015-07-17 MED ORDER — CALCIUM CHLORIDE 10 % IV SOLN
200.0000 mg | Freq: Once | INTRAVENOUS | Status: DC
  Administered 2015-07-17: 200 mg via INTRAVENOUS

## 2015-07-17 MED ORDER — SODIUM CHLORIDE 0.9 % IV SOLN
Freq: Once | INTRAVENOUS | Status: AC
  Administered 2015-07-17: 10 mL/h via INTRAVENOUS

## 2015-07-17 MED ORDER — MAGNESIUM SULFATE 2 GM/50ML IV SOLN
2.0000 g | Freq: Once | INTRAVENOUS | Status: AC
  Administered 2015-07-17: 2 g via INTRAVENOUS
  Filled 2015-07-17: qty 50

## 2015-07-17 MED ORDER — POTASSIUM CHLORIDE 10 MEQ/50ML IV SOLN
10.0000 meq | INTRAVENOUS | Status: AC | PRN
  Administered 2015-07-17: 10 meq via INTRAVENOUS

## 2015-07-17 MED ORDER — SODIUM BICARBONATE 8.4 % IV SOLN
100.0000 meq | Freq: Once | INTRAVENOUS | Status: AC
  Administered 2015-07-17: 100 meq via INTRAVENOUS
  Filled 2015-07-17: qty 100

## 2015-07-17 MED ORDER — ALBUMIN HUMAN 5 % IV SOLN
12.5000 g | Freq: Once | INTRAVENOUS | Status: AC
Start: 2015-07-17 — End: 2015-07-17

## 2015-07-17 MED ORDER — SODIUM CHLORIDE 0.9 % IV SOLN
Freq: Once | INTRAVENOUS | Status: AC
  Administered 2015-07-22: 09:00:00 via INTRAVENOUS

## 2015-07-17 MED ORDER — AMIODARONE HCL IN DEXTROSE 360-4.14 MG/200ML-% IV SOLN
30.0000 mg/h | INTRAVENOUS | Status: AC
  Administered 2015-07-17: 150 mg/h via INTRAVENOUS
  Administered 2015-07-17: 30 mg/h via INTRAVENOUS
  Filled 2015-07-17: qty 200

## 2015-07-17 MED ORDER — MILRINONE LACTATE IN DEXTROSE 20-5 MG/100ML-% IV SOLN
0.1250 ug/kg/min | INTRAVENOUS | Status: DC
  Administered 2015-07-17 – 2015-08-10 (×23): 0.125 ug/kg/min via INTRAVENOUS
  Filled 2015-07-17 (×24): qty 100

## 2015-07-17 MED ORDER — CALCIUM CHLORIDE 10 % IV SOLN
200.0000 mg | Freq: Once | INTRAVENOUS | Status: AC
  Administered 2015-07-17: 200 mg via INTRAVENOUS

## 2015-07-17 NOTE — Progress Notes (Signed)
S:  Intubated sedated.  CVVHD stopped yest due to hypotension.  O:BP 107/50 mmHg  Pulse 113  Temp(Src) 98.6 F (37 C) (Core (Comment))  Resp 35  Ht '5\' 11"'$  (1.803 m)  Wt 119.5 kg (263 lb 7.2 oz)  BMI 36.76 kg/m2  SpO2 86%  Intake/Output Summary (Last 24 hours) at 07/17/15 4034 Last data filed at 07/17/15 0730  Gross per 24 hour  Intake 6398.9 ml  Output   1910 ml  Net 4488.9 ml   Weight change:  Gen: intubated, sedated CVS: tachy, reg   Resp: Decreased BS bases Abd: No BS, + distention  + chest tubes Ext: + edema,   Lt forearm AVF + bruit NEURO: Sedated Aortic balloon pump Lt fem HD cath   . acetaminophen  1,000 mg Oral Q6H   Or  . acetaminophen (TYLENOL) oral liquid 160 mg/5 mL  1,000 mg Per Tube Q6H  . antiseptic oral rinse  7 mL Mouth Rinse 10 times per day  . artificial tears  1 application Both Eyes V4Q  . bisacodyl  10 mg Oral Daily   Or  . bisacodyl  10 mg Rectal Daily  . calcium acetate  1,334 mg Oral TID WC  . chlorhexidine gluconate (SAGE KIT)  15 mL Mouth Rinse BID  . cisatracurium  0.05 mg/kg Intravenous Once  . darbepoetin (ARANESP) injection - DIALYSIS  60 mcg Intravenous Q Tue-HD  . docusate sodium  200 mg Oral Daily  . famotidine (PEPCID) IV  20 mg Intravenous Q24H  . fentaNYL (SUBLIMAZE) injection  100 mcg Intravenous Once  . insulin regular  0-10 Units Intravenous TID WC  . magic mouthwash  10 mL Oral TID  . magnesium sulfate  4 g Intravenous Once  . midazolam  2 mg Intravenous Once  . multivitamin  1 tablet Oral QHS  . pantoprazole  40 mg Oral Daily  . piperacillin-tazobactam (ZOSYN)  IV  3.375 g Intravenous Q6H  . pregabalin  75 mg Oral BID  . rosuvastatin  20 mg Oral QHS  . sodium chloride flush  3 mL Intravenous Q12H   Dg Chest Port 1 View  07/17/2015  CLINICAL DATA:  ETT. EXAM: PORTABLE CHEST 1 VIEW COMPARISON:  July 16, 2015 FINDINGS: Support apparatus is in good position. No pneumothorax. Cardiomegaly and pulmonary edema are seen. There  is opacity in the left lung base which could represent atelectasis. Recommend attention on follow-up. IMPRESSION: Cardiomegaly and edema.  Stable support apparatus. Electronically Signed   By: Dorise Bullion III M.D   On: 07/17/2015 07:52   Dg Chest Port 1 View  07/16/2015  CLINICAL DATA:  CABG EXAM: PORTABLE CHEST 1 VIEW COMPARISON:  Chest radiograph from earlier today. FINDINGS: Endotracheal tube tip is approximately 3.8 cm above the carina. Enteric tube enters stomach with the tip not seen on this image. Bilateral chest tubes, mediastinal drains, midline and right shoulder skin staples are stable in configuration. Left internal jugular Swan-Ganz catheter terminates in the main pulmonary artery. Intra-aortic balloon pump marker is at the T3-4 level overlying the very proximal descending aorta. Stable cardiomediastinal silhouette with moderate cardiomegaly. No pneumothorax. Stable trace bilateral pleural effusions. Mild-to-moderate pulmonary edema appears minimally improved. Stable low lung volumes with bilateral lower lobe atelectasis. IMPRESSION: 1. No pneumothorax. 2. Mild-to-moderate congestive heart failure, slightly improved. 3. Low lung volumes with bilateral lower lobe atelectasis, unchanged. 4. Intra-aortic balloon pump marker is at the T3-4 level overlying the very proximal descending aorta. Electronically Signed   By: Corene Cornea  A Poff M.D.   On: 07/16/2015 07:52   Dg Chest Port 1 View  07/16/2015  CLINICAL DATA:  Shortness of breath.  Recent re-exploration. EXAM: PORTABLE CHEST 1 VIEW COMPARISON:  Yesterday FINDINGS: Endotracheal tube tip is at the clavicular heads. Swan-Ganz catheter from left IJ approach directed into the right pulmonary artery. Aortic balloon pump with tip overlapping the expected location of the arch. Mildly higher appearance of the central thoracic drain. Other drains appears similar to prior. Nasogastric tube tip at least reaches the stomach. Cardiopericardial enlargement that is  similar to prior when allowing for lower volumes. Perihilar atelectasis. Layering left effusion. Probable edema. No pneumothorax. IMPRESSION: 1. Similar positioning of tubes and central line. 2. Unchanged cardiopericardial enlargement. 3. Lower volumes with increased atelectasis or layering fluid on the left. Probable edema. Electronically Signed   By: Monte Fantasia M.D.   On: 07/16/2015 00:33   Dg Chest Port 1 View  07/30/2015  CLINICAL DATA:  Status post CABG x2. EXAM: PORTABLE CHEST 1 VIEW COMPARISON:  Chest x-ray from earlier same day. FINDINGS: Cardiomediastinal silhouette appears stable in size and configuration. Endotracheal tube is well positioned with tip approximately 2.5 cm above the level of the carina. Chest tubes and mediastinal drains appear stable in position. Swan-Ganz catheter appears stable in position with tip in the midline. Nasogastric tube passes below the diaphragm. There is mild central pulmonary vascular congestion without overt alveolar pulmonary edema. Probable mild atelectasis again appreciated at the left lung base, and probable small left pleural effusion. No new lung findings. IMPRESSION: 1. Endotracheal tube now appears well positioned with tip just above the level of the carina. 2. Remainder of the support apparatus appears stable in position. 3. Mild central pulmonary vascular congestion without overt pulmonary edema. 4. Probable stable atelectasis and/or small effusion at the left lung base. Electronically Signed   By: Franki Cabot M.D.   On: 07/08/2015 20:00   Dg Chest Portable 1 View  07/09/2015  CLINICAL DATA:  70 year old male status post CABG EXAM: PORTABLE CHEST 1 VIEW COMPARISON:  Chest radiograph dated 07/14/2015 FINDINGS: Single portable view of the chest. An endotracheal tube is noted with tip approximately 6.5 cm above the carina. Bilateral chest tubes seen the tip of the right chest tube is superimposed over the right posterior sixth rib and the tip of the left  chest tube is located in the left upper lung field superimposed over the left posterior fifth rib. A third chest tube is seen at the level of the left hemidiaphragm. A Swan-Ganz catheter with tip at the level of the pulmonary artery. An inferiorly placed catheter extend upward with tip superimposed over the descending thoracic aorta. This may represent an aortic balloon pump. A scope is seen over the mediastinum. There patchy areas of opacity predominantly involving the left lung which may represent atelectatic changes or infiltrate. There is diffuse increased interstitial markings on the right without focal consolidation. Trace pleural effusion may be present on the right. The left costophrenic angle has been excluded from the image. There is no pneumothorax. There is mild enlargement of the cardiopericardial silhouette. Multiple surgical clips noted in the soft tissues of the right upper chest wall. No acute osseous pathology identified. IMPRESSION: Support lines and tubes as described. Airspace opacity predominantly involving the left lung are more pronounced in the left mid and lower lung field may represent atelectasis versus infiltrate. Clinical correlation and follow-up recommended. No pneumothorax. Mild cardiopericardial enlargement. Electronically Signed   By: Milas Hock  Radparvar M.D.   On: 07/25/2015 19:03   Dg Abd Portable 1v  07/17/2015  CLINICAL DATA:  Ileus EXAM: PORTABLE ABDOMEN - 1 VIEW COMPARISON:  None. FINDINGS: Support apparatus in the chest is better evaluated on the chest x-ray performed same day. Skin staples are seen over the midline of the chest and upper abdomen. A paucity of bowel gas limits evaluation but no obstruction or ileus is seen. A left femoral line is noted. Opacity in the left base is identified. IMPRESSION: No ileus identified. Electronically Signed   By: Dorise Bullion III M.D   On: 07/17/2015 07:51   BMET    Component Value Date/Time   NA 142 07/17/2015 0722   NA 143  09/11/2014 1549   NA 143 01/05/2014 1334   NA 139 02/04/2008 1320   K 3.8 07/17/2015 0722   K 5.5* 01/05/2014 1334   K 4.3 02/04/2008 1320   CL 102 07/17/2015 0722   CL 112* 01/05/2014 1334   CL 102 02/04/2008 1320   CO2 19* 07/17/2015 0301   CO2 21 01/05/2014 1334   CO2 28 02/04/2008 1320   GLUCOSE 152* 07/17/2015 0722   GLUCOSE 200* 09/11/2014 1549   GLUCOSE 104* 01/05/2014 1334   GLUCOSE 240* 02/04/2008 1320   BUN 26* 07/17/2015 0722   BUN 57* 09/11/2014 1549   BUN 88* 01/05/2014 1334   BUN 18 02/04/2008 1320   CREATININE 4.70* 07/17/2015 0722   CREATININE 3.52* 05/26/2014 1024   CREATININE 1.3* 02/04/2008 1320   CALCIUM 8.2* 07/17/2015 0301   CALCIUM 7.5* 11/06/2014 0421   CALCIUM 6.9* 05/26/2014 1024   CALCIUM 8.8 02/04/2008 1320   GFRNONAA 12* 07/17/2015 0301   GFRNONAA 17* 05/26/2014 1024   GFRNONAA 19* 01/05/2014 1334   GFRAA 13* 07/17/2015 0301   GFRAA 20* 05/26/2014 1024   GFRAA 23* 01/05/2014 1334   CBC    Component Value Date/Time   WBC 12.3* 07/17/2015 0726   WBC 4.9 03/18/2015 1539   WBC 4.7 05/26/2014 1024   RBC 2.79* 07/17/2015 0726   RBC 2.91* 03/18/2015 1539   RBC 3.02* 11/06/2014 0421   RBC 3.61* 05/26/2014 1024   RBC 5.35 03/23/2008 1117   HGB 8.0* 07/17/2015 0726   HGB 10.2* 05/26/2014 1024   HGB 15.8 03/23/2008 1117   HCT 23.7* 07/17/2015 0726   HCT 25.8* 03/18/2015 1539   HCT 30.6* 05/26/2014 1024   HCT 46.2 03/23/2008 1117   PLT 50* 07/17/2015 0726   PLT 99* 03/18/2015 1539   PLT 80* 05/26/2014 1024   PLT 93* 03/23/2008 1117   MCV 84.9 07/17/2015 0726   MCV 89 03/18/2015 1539   MCV 85 05/26/2014 1024   MCV 86 03/23/2008 1117   MCH 28.7 07/17/2015 0726   MCH 29.2 03/18/2015 1539   MCH 28.3 05/26/2014 1024   MCH 29.5 03/23/2008 1117   MCHC 33.8 07/17/2015 0726   MCHC 32.9 03/18/2015 1539   MCHC 33.4 05/26/2014 1024   MCHC 34.1 03/23/2008 1117   RDW 16.6* 07/17/2015 0726   RDW 16.8* 03/18/2015 1539   RDW 15.7* 05/26/2014 1024    RDW 12.7 03/23/2008 1117   LYMPHSABS 0.7* 05/25/2015 1343   LYMPHSABS 0.6* 09/11/2014 0000   LYMPHSABS 0.7* 05/26/2014 1024   LYMPHSABS 1.3 03/23/2008 1117   MONOABS 0.4 05/25/2015 1343   MONOABS 0.3 05/26/2014 1024   EOSABS 0.0 05/25/2015 1343   EOSABS 0.1 09/11/2014 0000   EOSABS 0.1 05/26/2014 1024   EOSABS 0.1 03/23/2008 1117  BASOSABS 0.0 05/25/2015 1343   BASOSABS 0.0 09/11/2014 0000   BASOSABS 0.0 05/26/2014 1024   BASOSABS 0.1 03/23/2008 1117     Assessment: 1. SP CABG 2. DM 3. Anemia 4. HTN 5. ESRD  TTS 6. Thrombocytopenia  Plan: 1.  When you want CVVHD restarted just tell the nurse as orders still active.  Joseph [name] T

## 2015-07-17 NOTE — Progress Notes (Signed)
CT surgery p.m. Rounds  Patient placed back on CRT but unable to remove much fluid because of vasodilatation, low mean blood pressure  Metabolic acidosis improving Cardiac output remains greater than 6 L/m Blood sugars controlled on IV insulin drip 1 platelet phoresis administered for platelet count 38,000

## 2015-07-17 NOTE — Progress Notes (Signed)
PULMONARY / CRITICAL CARE MEDICINE   Name: Joseph [name] MRN: [id number] DOB: [birth year]    ADMISSION DATE:  06/11/2015 CONSULTATION DATE:  07/16/2015  REFERRING MD:  Nils Pyle  CHIEF COMPLAINT:  Post cardiothoracic surgery ventilator management  HISTORY OF PRESENT ILLNESS:   70 y/o male with ESRD and significant CAD (CABG 2001, DES 2016, EF 40-45%) was admitted on 5/29 with NSTEMI and had persistent chest pain during his hospitalization so he was taken for a redo CABG on 6/8 (SVG to diag and SVG to OM, IABP placement and CVL placement).  Post operatively he had excessive bleeding from all chest drains (pleural and mediastinal) so he was taken back to the OR on 6/9 for re-exploration.  PCCM was consulted for ventilator management.    SUBJECTIVE:  Remains critically ill, on vent, IABP, multiple pressors, still bleeding.   VITAL SIGNS: BP 107/50 mmHg  Pulse 114  Temp(Src) 98.6 F (37 C) (Core (Comment))  Resp 35  Ht 5\' 11"  (1.803 m)  Wt 119.5 kg (263 lb 7.2 oz)  BMI 36.76 kg/m2  SpO2 90%  HEMODYNAMICS: PAP: (36-48)/(24-37) 40/28 mmHg CVP:  [14 mmHg-20 mmHg] 17 mmHg CO:  [6.1 L/min-9.2 L/min] 9 L/min CI:  [2.9 L/min/m2-4.3 L/min/m2] 4.3 L/min/m2  VENTILATOR SETTINGS: Vent Mode:  [-] PRVC FiO2 (%):  [60 %-80 %] 80 % Set Rate:  [35 bmp] 35 bmp Vt Set:  [450 mL-550 mL] 550 mL PEEP:  [10 cmH20-14 cmH20] 10 cmH20 Pressure Support:  [10 cmH20] 10 cmH20 Plateau Pressure:  [25 cmH20-31 cmH20] 28 cmH20  INTAKE / OUTPUT: I/O last 3 completed shifts: In: 14650.3 [I.V.:7551.3; Blood:5109; NG/GT:90; IV Piggyback:1900] Out: [id number] [Urine:547; Emesis/NG output:150; VY:[id number]; Blood:350; Chest Tube:4810]  PHYSICAL EXAMINATION: General:  Critically ill  Neuro:  Heavily sedated on vent HEENT:  Diffuse facial edema, ETT in place Cardiovascular:  Balloon pump, median sternotomy, some bleeding outside previous marked edges Lungs:  resps even non labored on vent, Rhonchi  bilaterally Abdomen:  Distended, some focal superficial bruising, -bs  Musculoskeletal:  No bony abnormalities Skin:  Bruising all over extensor surfaces and belly as noted above  LABS:  BMET  Recent Labs Lab 07/16/15 0520  07/16/15 1507  07/16/15 2006 07/17/15 0301 07/17/15 0722  NA 142  < > 142  < > 142 141 142  K 4.2  < > 4.9  < > 4.6 4.1 3.8  CL 108  < > 111  < > 105 106 102  CO2 23  --  19*  --   --  19*  --   BUN 21*  < > 22*  < > 24* 26* 26*  CREATININE 3.59*  < > 3.77*  3.72*  < > 3.80* 4.75* 4.70*  GLUCOSE 139*  < > 116*  < > 134* 163* 152*  < > = values in this interval not displayed.  Electrolytes  Recent Labs Lab 07/13/15 0650  07/16/15 0520 07/16/15 1507 07/17/15 0301  CALCIUM 8.5*  < > 9.0 8.2* 8.2*  MG  --   < > 1.5* 1.8 1.8  PHOS 5.6*  --   --  6.4* 5.0*  < > = values in this interval not displayed.  CBC  Recent Labs Lab 07/16/15 2140 07/17/15 0301 07/17/15 0722 07/17/15 0726  WBC 12.4* 13.0*  --  12.3*  HGB 8.8* 8.5* 7.8* 8.0*  HCT 26.4* 25.5* 23.0* 23.7*  PLT 42* 36*  --  50*    Coag's  Recent Labs Lab 07/22/2015 1929 07/13/2015  2050 07/16/15 0053  APTT 142* 94* 52*  INR 1.60* 1.43 1.05    Sepsis Markers No results for input(s): LATICACIDVEN, PROCALCITON, O2SATVEN in the last 168 hours.  ABG  Recent Labs Lab 07/16/15 2358 07/17/15 0248 07/17/15 0721  PHART 7.266* 7.271* 7.334*  PCO2ART 41.8 39.5 38.9  PO2ART 72.0* 74.0* 78.0*    Liver Enzymes  Recent Labs Lab 07/14/15 0927 07/16/15 1507 07/16/15 2140 07/17/15 0301  AST 28  --  3011* 3032*  ALT 26  --  2983* 3212*  ALKPHOS 74  --  113 123  BILITOT 0.9  --  3.3* 3.4*  ALBUMIN 3.7 2.7* 2.5* 2.4*    Cardiac Enzymes  Recent Labs Lab 07/13/15 0040 07/13/15 0650 07/14/15 2330  TROPONINI 0.07* 0.08* 0.15*    Glucose  Recent Labs Lab 07/16/15 2108 07/16/15 2149 07/16/15 2300 07/16/15 2355 07/17/15 0246 07/17/15 0534  GLUCAP 157* 152* 147* 148* 147*  149*    Imaging Dg Chest Port 1 View  07/17/2015  CLINICAL DATA:  ETT. EXAM: PORTABLE CHEST 1 VIEW COMPARISON:  July 16, 2015 FINDINGS: Support apparatus is in good position. No pneumothorax. Cardiomegaly and pulmonary edema are seen. There is opacity in the left lung base which could represent atelectasis. Recommend attention on follow-up. IMPRESSION: Cardiomegaly and edema.  Stable support apparatus. Electronically Signed   By: Dorise Bullion III M.D   On: 07/17/2015 07:52   Dg Abd Portable 1v  07/17/2015  CLINICAL DATA:  Ileus EXAM: PORTABLE ABDOMEN - 1 VIEW COMPARISON:  None. FINDINGS: Support apparatus in the chest is better evaluated on the chest x-ray performed same day. Skin staples are seen over the midline of the chest and upper abdomen. A paucity of bowel gas limits evaluation but no obstruction or ileus is seen. A left femoral line is noted. Opacity in the left base is identified. IMPRESSION: No ileus identified. Electronically Signed   By: Dorise Bullion III M.D   On: 07/17/2015 07:51     STUDIES:  6/8 TEE >  CULTURES:   ANTIBIOTICS: 6/8 Vanc >>> 6/10 Zosyn>>>  SIGNIFICANT EVENTS: 6/8 Redo CABG 6/8 take back to OR emergently for diffuse bleeding  LINES/TUBES: Left forearm fistula 6/8 R IJ introducer > 6/8 Multiple chest tubes (mediastinal and pleural bilateral)  6/8 CVC left femoral vein >  6/8 R radial arterial line >  6/8 Swan Ganz >   DISCUSSION: 70 y/o male s/p redo CABG 6/8 complicated by significant post operative bleeding in the setting of thrombocytopenia brought back to the Surgical ICU on 6/9 with hypoxemic respiratory failure.  He has baseline ESRD and DM2.  ASSESSMENT / PLAN:  PULMONARY A: Acute respiratory failure with hypoxemia> Pulm edema, at risk for TRALI or TACO Post operative pleural bleeding Respiratory acidosis> suspect mostly V/Q mismatch from cardiogenic shock - improved P:   F/u ABG  Continue high vent settings Continue nimbex for  now with open sternum, severe ARDS  Maintain PEEP 10, wean FiO2 if Able, watch SaO2 and maintain > 90% CXR in AM  Chest tubes per thoracic  CARDIOVASCULAR A:  Cardiogenic shock CAD s/p redo CABG P:  Vasopressors per TCTS -- on epi gtt, levophed, vasopressin  IABP per cardiology/TCTS Tele  RENAL A:   ESRD P:   Will need volume removal as soon as pressure tolerates Renal following Has trauma cath in left groin could use for CVVH  GASTROINTESTINAL A:   No acute issues P:   OG tube H2 blocker for stress ulcer  prophylaxis  HEMATOLOGIC A:   Diffuse oozing due to coagulopathy from blood loss, thrombocytopenia P:  Transfuse per TCTS guidelines  Trend CBC, coags   INFECTIOUS A:   ?HCAP - doubt P:   Respiratory culture pending  Empiric abx ordered per CVTS  ENDOCRINE A:   DM2   P:   Monitor glucose Insulin gtt per TCTS post op protocol  NEUROLOGIC A:   Sedation needs for vent synchrony P:   RASS goal: -5 Fentanyl gtt  Versed gtt Nimbex > all sedation and paralytic per protocol (BIS monitoring, train of four)  FAMILY  - Updates: updated by CVTS 6/10  - Inter-disciplinary family meet or Palliative Care meeting due by:  day [place], NP 07/17/2015  8:30 AM Pager: [phone] or [phone]) [phone]

## 2015-07-17 NOTE — Progress Notes (Signed)
2 Days Post-Op Procedure(s) (LRB): EXPLORATION POST OPERATIVE OPEN HEART (N/A) Subjective: Patient remains critically ill with multi system dysfunction LFTs this a.m. show transaminase elevation greater than 3000 Patient very vasodilated with co-ox 77%-cardiac output greater than 6 L/m. Will reduce milrinone to 0.125. Start broad-spectrum antibiotics and check cultures for possible sepsis Balloon pump augmentation reduced 1:2 because of tachycardia Chest x-ray remains fairly clear, PEEP at 10 Patient with volume overload, weight gain greater than 25 pounds from massive transfusion secondary to coagulopathy Objective: Vital signs in last 24 hours: Temp:  [96.3 F (35.7 C)-100.6 F (38.1 C)] 98.6 F (37 C) (06/10 0915) Pulse Rate:  [35-207] 113 (06/10 0915) Cardiac Rhythm:  [-] Sinus tachycardia (06/10 0750) Resp:  [0-35] 35 (06/10 0915) BP: (72-114)/(43-53) 107/50 mmHg (06/10 0700) SpO2:  [79 %-99 %] 93 % (06/10 0915) Arterial Line BP: (64-130)/(39-55) 116/46 mmHg (06/10 0915) FiO2 (%):  [60 %-80 %] 80 % (06/10 0600)  Hemodynamic parameters for last 24 hours: PAP: (36-48)/(24-37) 38/26 mmHg CVP:  [14 mmHg-21 mmHg] 21 mmHg CO:  [6.1 L/min-9.2 L/min] 9 L/min CI:  [2.9 L/min/m2-4.3 L/min/m2] 4.3 L/min/m2  Intake/Output from previous day: 06/09 0701 - 06/10 0700 In: 6110.6 [I.V.:4867.6; Blood:533; NG/GT:60; IV Piggyback:650] Out: 2175 [Urine:272; Emesis/NG output:150; Chest Tube:1520] Intake/Output this shift: Total I/O In: 800.5 [I.V.:200.5; Blood:600] Out: 65 [Urine:15; Chest Tube:50]  Sedated on ventilator Breath sounds slightly coarse Pupils reactive Abdomen soft Extremities warm with positive Doppler pulses  Lab Results:  Recent Labs  07/17/15 0301 07/17/15 0722 07/17/15 0726  WBC 13.0*  --  12.3*  HGB 8.5* 7.8* 8.0*  HCT 25.5* 23.0* 23.7*  PLT 36*  --  50*   BMET:  Recent Labs  07/16/15 1507  07/17/15 0301 07/17/15 0722  NA 142  < > 141 142  K 4.9  < >  4.1 3.8  CL 111  < > 106 102  CO2 19*  --  19*  --   GLUCOSE 116*  < > 163* 152*  BUN 22*  < > 26* 26*  CREATININE 3.77*  3.72*  < > 4.75* 4.70*  CALCIUM 8.2*  --  8.2*  --   < > = values in this interval not displayed.  PT/INR:  Recent Labs  07/16/15 0053  LABPROT 13.9  INR 1.05   ABG    Component Value Date/Time   PHART 7.334* 07/17/2015 0721   HCO3 20.6 07/17/2015 0721   TCO2 22 07/17/2015 0722   ACIDBASEDEF 5.0* 07/17/2015 0721   O2SAT 94.0 07/17/2015 0721   CBG (last 3)   Recent Labs  07/17/15 0246 07/17/15 0534 07/17/15 0822  GLUCAP 147* 149* 154*    Assessment/Plan: S/P Procedure(s) (LRB): EXPLORATION POST OPERATIVE OPEN HEART (N/A) Follow ABGs and correct base deficit as needed We'll attempt CRT therapy to remove volume today Broad-spectrum antibiotics, sputum culture pending Wean pressors   as tolerated   LOS: 11 days    Joseph [name] 07/17/2015

## 2015-07-17 NOTE — Plan of Care (Signed)
Dr. Darcey Nora notified of runs SVT, frequent PVC's and PAC's

## 2015-07-18 ENCOUNTER — Inpatient Hospital Stay (HOSPITAL_COMMUNITY): Payer: Medicare Other

## 2015-07-18 LAB — TYPE AND SCREEN
ABO/RH(D): A POS
ABO/RH(D): A POS
Antibody Screen: NEGATIVE
Antibody Screen: NEGATIVE
Unit division: 0
Unit division: 0
Unit division: 0
Unit division: 0
Unit division: 0
Unit division: 0
Unit division: 0
Unit division: 0
Unit division: 0
Unit division: 0
Unit division: 0
Unit division: 0
Unit division: 0
Unit division: 0
Unit division: 0
Unit division: 0
Unit division: 0
Unit division: 0
Unit division: 0
Unit division: 0
Unit division: 0
Unit division: 0
Unit division: 0
Unit division: 0
Unit division: 0
Unit division: 0
Unit division: 0
Unit division: 0
Unit division: 0
Unit division: 0
Unit division: 0
Unit division: 0
Unit division: 0
Unit division: 0
Unit division: 0
Unit division: 0
Unit division: 0

## 2015-07-18 LAB — POCT I-STAT 3, ART BLOOD GAS (G3+)
ACID-BASE EXCESS: 1 mmol/L (ref 0.0–2.0)
Acid-base deficit: 1 mmol/L (ref 0.0–2.0)
BICARBONATE: 24.6 meq/L — AB (ref 20.0–24.0)
BICARBONATE: 25.3 meq/L — AB (ref 20.0–24.0)
BICARBONATE: 23.1 meq/L (ref 20.0–24.0)
Bicarbonate: 24.4 mEq/L — ABNORMAL HIGH (ref 20.0–24.0)
O2 SAT: 97 %
O2 SAT: 99 %
O2 Saturation: 98 %
O2 Saturation: 98 %
PH ART: 7.444 (ref 7.350–7.450)
PH ART: 7.439 (ref 7.350–7.450)
PO2 ART: 88 mmHg (ref 80.0–100.0)
PO2 ART: 95 mmHg (ref 80.0–100.0)
Patient temperature: 36
Patient temperature: 37
TCO2: 25 mmol/L (ref 0–100)
TCO2: 26 mmol/L (ref 0–100)
TCO2: 26 mmol/L (ref 0–100)
TCO2: 24 mmol/L (ref 0–100)
pCO2 arterial: 35.2 mmHg (ref 35.0–45.0)
pCO2 arterial: 37.8 mmHg (ref 35.0–45.0)
pCO2 arterial: 37.3 mmHg (ref 35.0–45.0)
pCO2 arterial: 35.3 mmHg (ref 35.0–45.0)
pH, Arterial: 7.421 (ref 7.350–7.450)
pH, Arterial: 7.424 (ref 7.350–7.450)
pO2, Arterial: 96 mmHg (ref 80.0–100.0)
pO2, Arterial: 115 mmHg — ABNORMAL HIGH (ref 80.0–100.0)

## 2015-07-18 LAB — GLUCOSE, CAPILLARY
GLUCOSE-CAPILLARY: 102 mg/dL — AB (ref 65–99)
GLUCOSE-CAPILLARY: 107 mg/dL — AB (ref 65–99)
GLUCOSE-CAPILLARY: 102 mg/dL — AB (ref 65–99)
GLUCOSE-CAPILLARY: 100 mg/dL — AB (ref 65–99)
GLUCOSE-CAPILLARY: 117 mg/dL — AB (ref 65–99)
GLUCOSE-CAPILLARY: 115 mg/dL — AB (ref 65–99)
GLUCOSE-CAPILLARY: 128 mg/dL — AB (ref 65–99)
Glucose-Capillary: 106 mg/dL — ABNORMAL HIGH (ref 65–99)
Glucose-Capillary: 107 mg/dL — ABNORMAL HIGH (ref 65–99)
Glucose-Capillary: 107 mg/dL — ABNORMAL HIGH (ref 65–99)
Glucose-Capillary: 110 mg/dL — ABNORMAL HIGH (ref 65–99)
Glucose-Capillary: 97 mg/dL (ref 65–99)
Glucose-Capillary: 97 mg/dL (ref 65–99)
Glucose-Capillary: 100 mg/dL — ABNORMAL HIGH (ref 65–99)
Glucose-Capillary: 101 mg/dL — ABNORMAL HIGH (ref 65–99)
Glucose-Capillary: 119 mg/dL — ABNORMAL HIGH (ref 65–99)
Glucose-Capillary: 128 mg/dL — ABNORMAL HIGH (ref 65–99)

## 2015-07-18 LAB — CBC
HCT: 27.1 % — ABNORMAL LOW (ref 39.0–52.0)
HCT: 26.7 % — ABNORMAL LOW (ref 39.0–52.0)
HCT: 26.6 % — ABNORMAL LOW (ref 39.0–52.0)
HCT: 28.7 % — ABNORMAL LOW (ref 39.0–52.0)
Hemoglobin: 9.2 g/dL — ABNORMAL LOW (ref 13.0–17.0)
Hemoglobin: 8.9 g/dL — ABNORMAL LOW (ref 13.0–17.0)
Hemoglobin: 9.1 g/dL — ABNORMAL LOW (ref 13.0–17.0)
Hemoglobin: 9.5 g/dL — ABNORMAL LOW (ref 13.0–17.0)
MCH: 28.3 pg (ref 26.0–34.0)
MCH: 28.2 pg (ref 26.0–34.0)
MCH: 28.4 pg (ref 26.0–34.0)
MCH: 28.5 pg (ref 26.0–34.0)
MCHC: 33.9 g/dL (ref 30.0–36.0)
MCHC: 33.3 g/dL (ref 30.0–36.0)
MCHC: 34.2 g/dL (ref 30.0–36.0)
MCHC: 33.1 g/dL (ref 30.0–36.0)
MCV: 83.4 fL (ref 78.0–100.0)
MCV: 84.5 fL (ref 78.0–100.0)
MCV: 83.1 fL (ref 78.0–100.0)
MCV: 86.2 fL (ref 78.0–100.0)
Platelets: 32 10*3/uL — ABNORMAL LOW (ref 150–400)
Platelets: 34 10*3/uL — ABNORMAL LOW (ref 150–400)
Platelets: 30 10*3/uL — ABNORMAL LOW (ref 150–400)
Platelets: 44 10*3/uL — ABNORMAL LOW (ref 150–400)
RBC: 3.25 MIL/uL — ABNORMAL LOW (ref 4.22–5.81)
RBC: 3.16 MIL/uL — ABNORMAL LOW (ref 4.22–5.81)
RBC: 3.2 MIL/uL — ABNORMAL LOW (ref 4.22–5.81)
RBC: 3.33 MIL/uL — ABNORMAL LOW (ref 4.22–5.81)
RDW: 16.3 % — ABNORMAL HIGH (ref 11.5–15.5)
RDW: 16.4 % — ABNORMAL HIGH (ref 11.5–15.5)
RDW: 16.5 % — ABNORMAL HIGH (ref 11.5–15.5)
RDW: 16.8 % — ABNORMAL HIGH (ref 11.5–15.5)
WBC: 11.5 10*3/uL — ABNORMAL HIGH (ref 4.0–10.5)
WBC: 11.6 10*3/uL — ABNORMAL HIGH (ref 4.0–10.5)
WBC: 12.7 10*3/uL — ABNORMAL HIGH (ref 4.0–10.5)
WBC: 14.3 10*3/uL — ABNORMAL HIGH (ref 4.0–10.5)

## 2015-07-18 LAB — POCT I-STAT, CHEM 8
BUN: 19 mg/dL (ref 6–20)
BUN: 18 mg/dL (ref 6–20)
BUN: 18 mg/dL (ref 6–20)
CALCIUM ION: 1.1 mmol/L — AB (ref 1.13–1.30)
CREATININE: 3.5 mg/dL — AB (ref 0.61–1.24)
CREATININE: 3.2 mg/dL — AB (ref 0.61–1.24)
Calcium, Ion: 1.08 mmol/L — ABNORMAL LOW (ref 1.13–1.30)
Calcium, Ion: 1.07 mmol/L — ABNORMAL LOW (ref 1.13–1.30)
Chloride: 101 mmol/L (ref 101–111)
Chloride: 101 mmol/L (ref 101–111)
Chloride: 101 mmol/L (ref 101–111)
Creatinine, Ser: 3 mg/dL — ABNORMAL HIGH (ref 0.61–1.24)
GLUCOSE: 96 mg/dL (ref 65–99)
GLUCOSE: 101 mg/dL — AB (ref 65–99)
Glucose, Bld: 124 mg/dL — ABNORMAL HIGH (ref 65–99)
HCT: 26 % — ABNORMAL LOW (ref 39.0–52.0)
HCT: 27 % — ABNORMAL LOW (ref 39.0–52.0)
HCT: 29 % — ABNORMAL LOW (ref 39.0–52.0)
HEMOGLOBIN: 8.8 g/dL — AB (ref 13.0–17.0)
HEMOGLOBIN: 9.2 g/dL — AB (ref 13.0–17.0)
Hemoglobin: 9.9 g/dL — ABNORMAL LOW (ref 13.0–17.0)
Potassium: 4.1 mmol/L (ref 3.5–5.1)
Potassium: 4.8 mmol/L (ref 3.5–5.1)
Potassium: 5.1 mmol/L (ref 3.5–5.1)
Sodium: 138 mmol/L (ref 135–145)
Sodium: 136 mmol/L (ref 135–145)
Sodium: 134 mmol/L — ABNORMAL LOW (ref 135–145)
TCO2: 25 mmol/L (ref 0–100)
TCO2: 24 mmol/L (ref 0–100)
TCO2: 23 mmol/L (ref 0–100)

## 2015-07-18 LAB — MAGNESIUM: MAGNESIUM: 2.2 mg/dL (ref 1.7–2.4)

## 2015-07-18 LAB — COMPREHENSIVE METABOLIC PANEL
ALT: 3386 U/L — ABNORMAL HIGH (ref 17–63)
AST: 2641 U/L — ABNORMAL HIGH (ref 15–41)
Albumin: 2.4 g/dL — ABNORMAL LOW (ref 3.5–5.0)
Alkaline Phosphatase: 126 U/L (ref 38–126)
Anion gap: 10 (ref 5–15)
BUN: 20 mg/dL (ref 6–20)
CO2: 24 mmol/L (ref 22–32)
Calcium: 7.6 mg/dL — ABNORMAL LOW (ref 8.9–10.3)
Chloride: 104 mmol/L (ref 101–111)
Creatinine, Ser: 3.64 mg/dL — ABNORMAL HIGH (ref 0.61–1.24)
GFR calc Af Amer: 18 mL/min — ABNORMAL LOW (ref >60)
GFR calc non Af Amer: 16 mL/min — ABNORMAL LOW (ref >60)
Glucose, Bld: 103 mg/dL — ABNORMAL HIGH (ref 65–99)
Potassium: 4 mmol/L (ref 3.5–5.1)
Sodium: 138 mmol/L (ref 135–145)
Total Bilirubin: 6 mg/dL — ABNORMAL HIGH (ref 0.3–1.2)
Total Protein: 4.7 g/dL — ABNORMAL LOW (ref 6.5–8.1)

## 2015-07-18 LAB — PREPARE PLATELET PHERESIS
Unit division: 0
Unit division: 0
Unit division: 0

## 2015-07-18 LAB — RENAL FUNCTION PANEL
ALBUMIN: 2.3 g/dL — AB (ref 3.5–5.0)
Anion gap: 8 (ref 5–15)
BUN: 17 mg/dL (ref 6–20)
CALCIUM: 7.6 mg/dL — AB (ref 8.9–10.3)
CO2: 23 mmol/L (ref 22–32)
CREATININE: 3.09 mg/dL — AB (ref 0.61–1.24)
Chloride: 103 mmol/L (ref 101–111)
GFR calc Af Amer: 22 mL/min — ABNORMAL LOW (ref >60)
GFR, EST NON AFRICAN AMERICAN: 19 mL/min — AB (ref >60)
Glucose, Bld: 116 mg/dL — ABNORMAL HIGH (ref 65–99)
PHOSPHORUS: 4.5 mg/dL (ref 2.5–4.6)
Potassium: 5.3 mmol/L — ABNORMAL HIGH (ref 3.5–5.1)
Sodium: 134 mmol/L — ABNORMAL LOW (ref 135–145)

## 2015-07-18 LAB — CARBOXYHEMOGLOBIN
Carboxyhemoglobin: 1.1 % (ref 0.5–1.5)
Methemoglobin: 1.3 % (ref 0.0–1.5)
O2 Saturation: 78.4 %
Total hemoglobin: 9.7 g/dL — ABNORMAL LOW (ref 13.5–18.0)

## 2015-07-18 LAB — CALCIUM, IONIZED: Calcium, Ionized, Serum: 4.4 mg/dL — ABNORMAL LOW (ref 4.5–5.6)

## 2015-07-18 LAB — PHOSPHORUS: PHOSPHORUS: 4.2 mg/dL (ref 2.5–4.6)

## 2015-07-18 LAB — CORTISOL-AM, BLOOD: Cortisol - AM: 21.5 ug/dL (ref 6.7–22.6)

## 2015-07-18 LAB — VANCOMYCIN, RANDOM: Vancomycin Rm: 14 ug/mL

## 2015-07-18 MED ORDER — METHYLPREDNISOLONE SODIUM SUCC 125 MG IJ SOLR
80.0000 mg | Freq: Two times a day (BID) | INTRAMUSCULAR | Status: AC
  Administered 2015-07-18 – 2015-07-19 (×3): 80 mg via INTRAVENOUS
  Filled 2015-07-18 (×3): qty 2

## 2015-07-18 MED ORDER — VANCOMYCIN HCL 10 G IV SOLR
1250.0000 mg | INTRAVENOUS | Status: DC
  Administered 2015-07-18 – 2015-07-20 (×3): 1250 mg via INTRAVENOUS
  Filled 2015-07-18 (×4): qty 1250

## 2015-07-18 MED ORDER — SODIUM CHLORIDE 0.9 % IV SOLN: Freq: Once | INTRAVENOUS | Status: DC

## 2015-07-18 MED ORDER — ALBUMIN HUMAN 5 % IV SOLN
INTRAVENOUS | Status: AC
  Filled 2015-07-18: qty 250

## 2015-07-18 NOTE — Progress Notes (Signed)
PULMONARY / CRITICAL CARE MEDICINE   Name: Joseph [name] MRN: [id number] DOB: [birth year]    ADMISSION DATE:  06/20/2015 CONSULTATION DATE:  07/16/2015  REFERRING MD:  Nils Pyle  CHIEF COMPLAINT:  Post cardiothoracic surgery ventilator management  HISTORY OF PRESENT ILLNESS:   70 y/o male with ESRD and significant CAD (CABG 2001, DES 2016, EF 40-45%) was admitted on 5/29 with NSTEMI and had persistent chest pain during his hospitalization so he was taken for a redo CABG on 6/8 (SVG to diag and SVG to OM, IABP placement and CVL placement).  Post operatively he had excessive bleeding from all chest drains (pleural and mediastinal) so he was taken back to the OR on 6/9 for re-exploration.  PCCM was consulted for ventilator management.    SUBJECTIVE:  Remains critically ill, on vent, IABP.  Increasing pressor needs.  Now on CVVHD pulling minimal volume.  Oxygen needs improving slightly with volume removal.  Plan return to OR for closure tomorrow per RN.    VITAL SIGNS: BP 90/56 mmHg  Pulse 95  Temp(Src) 97.5 F (36.4 C) (Core (Comment))  Resp 35  Ht 5\' 11"  (1.803 m)  Wt 122.6 kg (270 lb 4.5 oz)  BMI 37.71 kg/m2  SpO2 94%  HEMODYNAMICS: PAP: (33-46)/(21-30) 41/25 mmHg CVP:  [17 mmHg-32 mmHg] 17 mmHg CO:  [5.8 L/min-9.3 L/min] 6.9 L/min CI:  [2.7 L/min/m2-4.4 L/min/m2] 3.2 L/min/m2  VENTILATOR SETTINGS: Vent Mode:  [-] PRVC FiO2 (%):  [80 %-100 %] 80 % Set Rate:  [35 bmp] 35 bmp Vt Set:  [550 mL] 550 mL PEEP:  [5 cmH20] 5 cmH20 Plateau Pressure:  [22 cmH20-27 cmH20] 27 cmH20  INTAKE / OUTPUT: I/O last 3 completed shifts: In: 10848.1 [I.V.:8355.1; JQ:[id number]; Other:30; NG/GT:120; IV Piggyback:850] Out: [id number] [Urine:310; Emesis/NG output:150; Other:5317; Chest Tube:1870]  PHYSICAL EXAMINATION: General:  Critically ill  Neuro:  Heavily sedated on vent HEENT:  Diffuse facial edema, ETT in place Cardiovascular:  Balloon pump, median sternotomy, some bleeding outside previous  marked edges Lungs:  resps even non labored on vent, rales throughout Abdomen:  Distended, some focal superficial bruising, -bs  Musculoskeletal:  No bony abnormalities Skin:  Bruising all over extensor surfaces and belly as noted above  LABS:  BMET  Recent Labs Lab 07/17/15 0301  07/17/15 1537 07/17/15 1923 07/18/15 0326 07/18/15 0720  NA 141  < > 140 142 138 138  K 4.1  < > 3.7 3.8 4.0 4.1  CL 106  < > 105 101 104 101  CO2 19*  --  24  --  24  --   BUN 26*  < > 25* 23* 20 19  CREATININE 4.75*  < > 4.15* 3.80* 3.64* 3.50*  GLUCOSE 163*  < > 122* 108* 103* 96  < > = values in this interval not displayed.  Electrolytes  Recent Labs Lab 07/16/15 1507 07/17/15 0301 07/17/15 1537 07/18/15 0326  CALCIUM 8.2* 8.2* 7.9* 7.6*  MG 1.8 1.8  --  2.2  PHOS 6.4* 5.0* 4.5 4.2    CBC  Recent Labs Lab 07/17/15 1915  07/18/15 0326 07/18/15 0720 07/18/15 0722  WBC 11.4*  --  11.5*  --  11.6*  HGB 9.4*  < > 9.2* 8.8* 8.9*  HCT 27.9*  < > 27.1* 26.0* 26.7*  PLT 33*  --  32*  --  34*  < > = values in this interval not displayed.  Coag's  Recent Labs Lab 07/31/2015 1929 07/11/2015 2050 07/16/15 0053  APTT 142*  94* 52*  INR 1.60* 1.43 1.05    Sepsis Markers No results for input(s): LATICACIDVEN, PROCALCITON, O2SATVEN in the last 168 hours.  ABG  Recent Labs Lab 07/17/15 2350 07/18/15 0317 07/18/15 0720  PHART 7.438 7.444 7.421  PCO2ART 35.3 35.2 37.8  PO2ART 73.0* 96.0 88.0    Liver Enzymes  Recent Labs Lab 07/16/15 2140 07/17/15 0301 07/17/15 1537 07/18/15 0326  AST 3011* 3032*  --  2641*  ALT 2983* 3212*  --  3386*  ALKPHOS 113 123  --  126  BILITOT 3.3* 3.4*  --  6.0*  ALBUMIN 2.5* 2.4* 2.4* 2.4*    Cardiac Enzymes  Recent Labs Lab 07/13/15 0040 07/13/15 0650 07/14/15 2330  TROPONINI 0.07* 0.08* 0.15*    Glucose  Recent Labs Lab 07/17/15 1117 07/17/15 1210 07/17/15 1421 07/17/15 1515 07/17/15 1612 07/17/15 1709  GLUCAP 138*  136* 126* 122* 119* 116*    Imaging No results found.   STUDIES:  6/8 TEE >  CULTURES: Sputum 6/10>>>  ANTIBIOTICS: 6/8 Vanc >>> 6/10 Zosyn>>>  SIGNIFICANT EVENTS: 6/8 Redo CABG 6/8 take back to OR emergently for diffuse bleeding  LINES/TUBES: Left forearm fistula 6/8 R IJ introducer > 6/8 Multiple chest tubes (mediastinal and pleural bilateral)  6/8 CVC left femoral vein >  6/8 R radial arterial line >  6/8 Swan Ganz >   DISCUSSION: 70 y/o male s/p redo CABG 6/8 complicated by significant post operative bleeding in the setting of thrombocytopenia brought back to the Surgical ICU on 6/9 with hypoxemic respiratory failure.  He has baseline ESRD and DM2.  ASSESSMENT / PLAN:  PULMONARY A: Acute respiratory failure with hypoxemia> Pulm edema, at risk for TRALI or TACO Post operative pleural bleeding Respiratory acidosis> suspect mostly V/Q mismatch from cardiogenic shock - improved P:   Continue gentle volume removal as able  Wean FiO2 as able  F/u ABG  Continue nimbex for now with open sternum, severe ARDS  Maintain PEEP 10, wean FiO2 if Able, watch SaO2 and maintain > 90% CXR in AM  Chest tubes per thoracic  CARDIOVASCULAR A:  Severe Cardiogenic and hemorrhagic shock CAD s/p redo CABG P:  Vasopressors per TCTS -- on epi gtt, levophed, vasopressin, milrinone   IABP per cardiology/TCTS Tele  RENAL A:   ESRD P:   Continue CVVHD per renal -- not tol much volume removal r/t shock and pressor needs but RN is able to even his hourly intake/outpt  Renal following   GASTROINTESTINAL A:   No acute issues P:   OG tube H2 blocker for stress ulcer prophylaxis  HEMATOLOGIC A:   Diffuse oozing due to coagulopathy from blood loss, thrombocytopenia P:  Transfuse per TCTS guidelines  Trend CBC, coags   INFECTIOUS A:   ?HCAP - doubt P:   Respiratory culture pending  Empiric abx ordered per CVTS  ENDOCRINE A:   DM2   P:   Monitor glucose Insulin  gtt per TCTS post op protocol  NEUROLOGIC A:   Sedation needs for vent synchrony P:   RASS goal: -5 Fentanyl gtt  Versed gtt Nimbex > all sedation and paralytic per protocol (BIS monitoring, train of four)  FAMILY  - Updates: updated by CVTS 6/11  - Inter-disciplinary family meet or Palliative Care meeting due by:  day [place], NP 07/18/2015  8:33 AM Pager: [phone] or [phone]) [phone]

## 2015-07-18 NOTE — Op Note (Signed)
[name], Joseph [name] NO.:  [id number]  MEDICAL RECORD NO.:  [id number]  LOCATION:  2S07C                        FACILITY:  [facility]  PHYSICIAN:  Ivin Poot, M.D.  DATE OF BIRTH:  [birth year]  DATE OF PROCEDURE:  07/28/2015 DATE OF DISCHARGE:                              OPERATIVE REPORT   OPERATION:  Reexploration for bleeding status post redo CABG.  SURGEON:  Ivin Poot, M.D.  ASSISTANT:  Levonne Lapping, RNFA.  ANESTHESIA:  General.  PREOPERATIVE DIAGNOSIS:  Coagulopathy, persistent chest tube output after redo CABG.  POSTOPERATIVE DIAGNOSIS:  Coagulopathy, persistent chest tube output after redo CABG.  OPERATIVE PROCEDURE:  The patient was brought directly from the ICU back to the operating room for persistent excessive chest tube drainage. After undergoing complex redo CABG complicated by severe coagulopathy and massive transfusion requirements.  The patient was hemodynamically stable prior to the transport and during the transport.  The patient's family was notified of this procedure.  I discussed the procedure with the patient's family.  DESCRIPTION OF PROCEDURE:  The patient was placed on the operating table.  Fully sedated and intubated.  The 2 previous mediastinal drains were removed, and the chest was prepped and draped as a sterile field. A proper time-out was performed.  The previous sternotomy was opened. There was a moderate amount of clotted and non-clotted blood in the anterior mediastinum.  The sternal edges, mediastinal soft tissue, epicardial fat, all had diffuse coagulopathy-using.  One area on the anterior surface of the right ventricle had venous oozing, and this was controlled with topical bowel adhesive glue and mild hand-held pressure with Surgicel.  The bypass grafts were identified and were patent with strong pulses.  The wound was irrigated with warm saline.  Two new mediastinal drains were replaced for the anterior  and posterior mediastinum and brought out through the previous chest tube sites.  The sternal edges were left open because of RV distention.  The subcutaneous layer and skin layers were closed with suture and skin staples and sterile dressings were applied.  The patient was then transported back to the ICU.     Ivin Poot, M.D.     PV/MEDQ  D:  07/17/2015  T:  07/18/2015  Job:  [id number]

## 2015-07-18 NOTE — Op Note (Signed)
NAMETORAN, MURCH NO.:  [id number]  MEDICAL RECORD NO.:  [id number]  LOCATION:  2S07C                        FACILITY:  [facility]  PHYSICIAN:  Ivin Poot, M.D.  DATE OF BIRTH:  [birth year]  DATE OF PROCEDURE:  07/09/2015 DATE OF DISCHARGE:                              OPERATIVE REPORT   OPERATION: 1. Redo sternotomy, redo CABG x2 (saphenous vein graft to circumflex     marginal, cryopreserved saphenous vein graft to diagonal). 2. Endoscopic harvest of right leg below the knee greater saphenous     vein. 3. Placement of intra-aortic balloon pump. 4. Right axillary artery exposure and cannulation for cardiopulmonary     bypass.  PREOPERATIVE DIAGNOSES:  Unstable angina; ischemic cardiomyopathy; chronic renal failure, on dialysis; chronic thrombocytopenia with previous bone marrow biopsy; history of hepatic fibrosis.  SURGEON:  Ivin Poot, M.D.  ASSISTANT:  Lars Pinks, PA-C.  ANESTHESIA:  General by Dr. Finis Bud.  INDICATIONS:  The patient is a 70 year old Caucasian male, diabetic, previous smoker, who had multivessel CABG in 2001 at [facility].  The patient did well, but has since developed end-stage renal failure on dialysis for several years and was admitted to the hospital 9 months ago with unstable angina and was found to have total occlusion of the 4 vein grafts placed at the time of the original operation and patent left IMA to the LAD.  The patient had biventricular dysfunction on echo and elevated right-sided pressures.  He had a tight left main stenosis and underwent a PCI to the left main by the Cardiology Service.  He was placed on antiplatelet agents, but these were adjusted because of clinical bleeding and severe low platelet count.  The patient returned to the hospital a week ago with unstable angina with positive cardiac enzymes.  Ejection fraction was 30% with biventricular dysfunction.   Coronary arteriograms showed high-grade in- stent stenosis of 95% of the left main stent.  The patient had a chronically occluded right coronary which was filled by collaterals from the LAD patent with a patent left IMA graft.  The patient was also thrombocytopenic on Brilinta.  The patient was seen by Interventional Cardiology and felt not to be a candidate for redo stenting.  A surgical consultation was placed.  I examined the patient, reviewed his coronary arteriograms and previous echocardiograms, and discussed possibility of redo CABG.  At first, we documented the presence of residual saphenous vein for conduit in his right lower leg.  There was no other saphenous vein available.  His right arm radial artery was not usable because of a positive Allen test.  The patient's platelet count reduced to 50,000 after heparin was started and the Brilinta was stopped.  Hematology consultation was performed and it was felt that the patient did not have heparin-induced thrombocytopenia, but had thrombocytopenia, multiple etiologies.  The patient kept having rest pain, which was severe.  I offered the patient high risk redo CABG for treatment of his severe recurrent coronary artery disease, not amenable to percutaneous reintervention.  The patient understood he was at high risk for bleeding and blood transfusion as well as multisystem failure, stroke, MI infection, and  death.  He understood that the morbidity and mortality of a redo CABG would be significantly increased because of his poor LV function, bleeding diathesis, and other comorbidities including chronic renal failure on dialysis.  OPERATIVE PROCEDURE:  The patient was brought to the operating room, placed supine on the operating table after general anesthesia was induced.  A transesophageal echo probe was placed by the anesthesiologist, which confirmed ejection fraction of 30% without significant valvular insufficiency.  The PA  pressures were elevated at 50/30.  The patient was prepped and draped.  A proper time-out was performed.  An A-line was placed in left femoral artery and a 5-French micro sheath was placed in the right femoral vein.  The vein was harvested endoscopically from the right leg.  A redo sternal incision was made.  The oscillating saw was used to avoid injury to the underlying vascular structures.  After dividing the sternum and carefully dissecting off the tissues underneath the sternum, it was apparent that the right ventricle was significantly adherent to the under part of the sternum and bleeding was encountered.  I decided to place the patient on cardiopulmonary bypass before any further mediastinal dissection was performed as there was significant blood loss from bleeding from the right ventricle.  Topical pressure was held on the heart as an incision was made beneath the right clavicle for exposure of the right axillary artery which was isolated, clamped proximally and distally and an arterial graft-cannula was sewn end-to- side with a running Prolene.  At the same time, a guidewire was placed up the 5-French mini sheath in the right femoral vein and a long venous cannula, 23-25 [place] was passed under echo guidance into the right atrium and SVC.  After these arterial inflow and venous drainage cannulas were placed, they were connected to the cardiopulmonary bypass pump.  The patient had been given a dose of heparin and the patient was immediately placed on cardiopulmonary bypass.  CO2 was used to insufflate the surgical field.  Then on bypass, I did further anterior mediastinal dissection.  It was clear that the pericardium had not been closed over the heart after the previous surgery.  The adhesions were dense and very bloody dissection to free up the anterior surface of the heart, the aorta.  I identified the mammary artery and placed a vessel loop around that.  I did not do any  posterior dissection at this point, but waited until the crossclamp was applied.  The saphenous vein was adequate for a graft and because the right IMA could not be harvested, a piece of cryopreserved saphenous vein was prepared according to protocol.  Cardioplegia cannulas were placed for both antegrade and retrograde cold blood cardioplegia.  The patient was cooled to 32 degrees and the aortic crossclamp was applied.  A liter of cold blood cardioplegia was delivered in split doses between the antegrade and retrograde coronary sinus catheters.  Cardioplegic arrest was achieved and septal temperature dropped less than 15 degrees.  Cardioplegia was delivered every 20 minutes while the crossclamp was in place.  After the heart had been arrested, the remainder of the dissection of the heart was completed.  With great difficulty, the circumflex marginal vessel was exposed.  The segment of the native saphenous vein was then sewn end-to-side with running 7-0 Prolene and there was excellent flow through this graft.  Cardioplegia was redosed.  The distal posterior descending branch of the right coronary was too small to graft.  A branch of the LAD  system, was dissected and identified and was an adequate vessel for grafting.  The cryopreserved vein was then sewn end-to-side with running 7-0 Prolene.  There was excellent flow through this graft.  Cardioplegia was redosed.  The vein grafts were then brought under the previously identified mammary pedicle and the need of vein was sewn end-to-side to the ascending aorta with a 4.5 mm punch running 6-0 Prolene.  There was not enough room on the aorta for more than 1 proximal anastomosis of the cryopreserved vein was sewn to the hood of the native vein and a vein-to- vein anastomosis using running 7-0 Prolene.  Air was evacuated from the coronaries in left side of the heart with a dose of retrograde warm blood cardioplegia in the usual de-airing  maneuvers.  The crossclamp was then removed.  Air was aspirated from the vein graft.  The vein grafts were opened and each had good flow, and hemostasis was documented at the proximal and distal anastomoses.  The patient was then rewarmed and reperfused.  The cardioplegia cannulas were removed.  Temporary pacing wires were applied.  There was diffuse bleeding from all the dissected surfaces in the mediastinum.  The patient was started on medium dose inotropes.  During the initial sternal reopening and the period of time when there was bleeding from the right ventricle, a balloon pump was placed via the right femoral artery over the previously placed catheter and using a guidewire and the Seldinger technique.  The patient was weaned from cardiopulmonary bypass on balloon pump support and medium dose inotropes.  LV function appeared to be baseline. RV function was depressed.  During the initial period of mediastinal dissection when there was bleeding from the right ventricle. The largest tear was repaired with a pericardial patch measuring approximately 2.5-3 cm in length over the conus portion of the right ventricle.  After separation from cardiopulmonary bypass, this area repair was hemostatic, not bleeding. However, the raw surfaces of the anterior right ventricle had diffuse oozing.  These were treated with topical measures after protamine was administered for heparin reversal.  Despite reversal of heparin activity with protamine, there was still diffuse bleeding and platelet count returned at only 12,000 and the fibrinogen returned only at 60.  The patient was then given blood component therapy as well as packed cells and more desmopressin. Cardiac function remained adequate. Blood pressure remained adequate. Blood gases were satisfactory and the patient had a stable paced rhythm. However, coagulopathy was severe and persistent.  After giving platelets and FFP and  cryoprecipitate, the patient was given a dose of recombinant factor VII.  After considerable time and tedious cautery and suture ligation of bleeding points in the mediastinum, the bleeding was fairly well controlled.  Chest tubes were placed in both pleural spaces in the anterior and posterior mediastinum and brought out through separate incisions.  The right ventricle was dilated and decided not to close the sternum.  The chest wall muscle layer and skin were then closed completely to close the chest.  Sterile dressings were applied.  The patient returned to the ICU on balloon pump and inotropic support.  Total cardiopulmonary bypass time was 300 minutes.     Ivin Poot, M.D.     PV/MEDQ  D:  07/17/2015  T:  07/18/2015  Job:  [id number]

## 2015-07-18 NOTE — Progress Notes (Signed)
3 Days Post-Op Procedure(s) (LRB): EXPLORATION POST OPERATIVE OPEN HEART (N/A) Subjective: Redo CABG 2 with preoperative hemodialysis dependent renal failure and chronic  thrombocytopenia-. Massive transfusion requirements for severe peri Operative coagulopathy. Now with significant fluid retention, vasodilatation with high cardiac output and CRT therapy to remove volume as tolerated by blood pressure Patient sedated and paralyzed on ventilator Sternal not closed but chest wall soft tissue closed to avoid external pressure on dilated RV. Will not be closing sternum in OR tomorrow until further tissue edema and cardiac function is improved Patient on empiric antibiotics for possible pneumonia, elevated white count Thrombocytopenia remains severe, platelet count 30-35 k Objective: Vital signs in last 24 hours: Temp:  [90.9 F (32.7 C)-99 F (37.2 C)] 98.8 F (37.1 C) (06/11 1145) Pulse Rate:  [36-140] 98 (06/11 1145) Cardiac Rhythm:  [-] Normal sinus rhythm (06/11 0736) Resp:  [31-35] 35 (06/11 1145) BP: (73-119)/(42-60) 75/42 mmHg (06/11 1100) SpO2:  [83 %-99 %] 95 % (06/11 1145) Arterial Line BP: (68-132)/(44-63) 76/44 mmHg (06/11 1145) FiO2 (%):  [80 %-100 %] 80 % (06/11 0800) Weight:  [270 lb 4.5 oz (122.6 kg)] 270 lb 4.5 oz (122.6 kg) (06/11 0600)  Hemodynamic parameters for last 24 hours: PAP: (35-46)/(21-30) 41/26 mmHg CVP:  [17 mmHg-32 mmHg] 18 mmHg CO:  [5.8 L/min-7.4 L/min] 6.9 L/min CI:  [2.7 L/min/m2-3.4 L/min/m2] 3.2 L/min/m2  Intake/Output from previous day: 06/10 0701 - 06/11 0700 In: 6720.2 [I.V.:5035.2; Blood:1295; NG/GT:60; IV Piggyback:300] Out: [id number] [Urine:160; Emesis/NG output:150; Chest Tube:930] Intake/Output this shift: Total I/O In: 1175.3 [I.V.:825.3; IV Piggyback:350] Out: 1087 [Urine:10; Other:1057; Chest Tube:20]  Generalized edema Coarse breath sounds Atrially paced rhythm Extremities warm Balloon pump in left femoral artery Pupils react  Lab  Results:  Recent Labs  07/18/15 0326 07/18/15 0720 07/18/15 0722  WBC 11.5*  --  11.6*  HGB 9.2* 8.8* 8.9*  HCT 27.1* 26.0* 26.7*  PLT 32*  --  34*   BMET:  Recent Labs  07/17/15 1537  07/18/15 0326 07/18/15 0720  NA 140  < > 138 138  K 3.7  < > 4.0 4.1  CL 105  < > 104 101  CO2 24  --  24  --   GLUCOSE 122*  < > 103* 96  BUN 25*  < > 20 19  CREATININE 4.15*  < > 3.64* 3.50*  CALCIUM 7.9*  --  7.6*  --   < > = values in this interval not displayed.  PT/INR:  Recent Labs  07/16/15 0053  LABPROT 13.9  INR 1.05   ABG    Component Value Date/Time   PHART 7.421 07/18/2015 0720   HCO3 24.6* 07/18/2015 0720   TCO2 26 07/18/2015 0720   TCO2 25 07/18/2015 0720   ACIDBASEDEF 1.0 07/17/2015 1258   O2SAT 97.0 07/18/2015 0720   CBG (last 3)   Recent Labs  07/18/15 0720 07/18/15 0832 07/18/15 0938  GLUCAP 107* 102* 97    Assessment/Plan: S/P Procedure(s) (LRB): EXPLORATION POST OPERATIVE OPEN HEART (N/A) Continue current care for multisystem failure secondary to severe coagulopathy and massive transfusion requirement  Continue CVVH to remove fluid as tolerated by blood pressure Not ready for sternal closure tomorrow   LOS: 12 days    Joseph [name] 07/18/2015

## 2015-07-18 NOTE — Plan of Care (Signed)
Talked with pharmacist regarding concentration of drips, cannot concentrate any differently from what we have running currently due to safety issues.

## 2015-07-18 NOTE — Progress Notes (Signed)
S:  Intubated sedated.  Back on CVVHD.  On 3 pressors and milrinone O:BP 90/56 mmHg  Pulse 96  Temp(Src) 97.3 F (36.3 C) (Core (Comment))  Resp 35  Ht '5\' 11"'$  (1.803 m)  Wt 122.6 kg (270 lb 4.5 oz)  BMI 37.71 kg/m2  SpO2 93%  Intake/Output Summary (Last 24 hours) at 07/18/15 0835 Last data filed at 07/18/15 0800  Gross per 24 hour  Intake 6138.9 ml  Output   6762 ml  Net -623.1 ml   Weight change:  Gen: intubated, sedated CVS: RRR  Resp: Decreased BS bases Abd: No BS, + distention  + chest tubes Ext: + edema,   Lt forearm AVF + bruit NEURO: Sedated Aortic balloon pump Lt fem HD cath   . sodium chloride   Intravenous Once  . acetaminophen  1,000 mg Oral Q6H   Or  . acetaminophen (TYLENOL) oral liquid 160 mg/5 mL  1,000 mg Per Tube Q6H  . albumin human      . amiodarone  150 mg Intravenous Once  . antiseptic oral rinse  7 mL Mouth Rinse 10 times per day  . artificial tears  1 application Both Eyes U3J  . bisacodyl  10 mg Oral Daily   Or  . bisacodyl  10 mg Rectal Daily  . calcium acetate  1,334 mg Oral TID WC  . chlorhexidine gluconate (SAGE KIT)  15 mL Mouth Rinse BID  . cisatracurium  0.05 mg/kg Intravenous Once  . darbepoetin (ARANESP) injection - DIALYSIS  60 mcg Intravenous Q Tue-HD  . docusate sodium  200 mg Oral Daily  . famotidine (PEPCID) IV  20 mg Intravenous Q24H  . fentaNYL (SUBLIMAZE) injection  100 mcg Intravenous Once  . insulin regular  0-10 Units Intravenous TID WC  . magic mouthwash  10 mL Oral TID  . magnesium sulfate  4 g Intravenous Once  . midazolam  2 mg Intravenous Once  . multivitamin  1 tablet Oral QHS  . pantoprazole  40 mg Oral Daily  . piperacillin-tazobactam (ZOSYN)  IV  3.375 g Intravenous Q6H  . pregabalin  75 mg Oral BID  . rosuvastatin  20 mg Oral QHS  . sodium chloride flush  3 mL Intravenous Q12H   Dg Chest Port 1 View  07/17/2015  CLINICAL DATA:  ETT. EXAM: PORTABLE CHEST 1 VIEW COMPARISON:  July 16, 2015 FINDINGS: Support  apparatus is in good position. No pneumothorax. Cardiomegaly and pulmonary edema are seen. There is opacity in the left lung base which could represent atelectasis. Recommend attention on follow-up. IMPRESSION: Cardiomegaly and edema.  Stable support apparatus. Electronically Signed   By: Dorise Bullion III M.D   On: 07/17/2015 07:52   Dg Abd Portable 1v  07/17/2015  CLINICAL DATA:  Ileus EXAM: PORTABLE ABDOMEN - 1 VIEW COMPARISON:  None. FINDINGS: Support apparatus in the chest is better evaluated on the chest x-ray performed same day. Skin staples are seen over the midline of the chest and upper abdomen. A paucity of bowel gas limits evaluation but no obstruction or ileus is seen. A left femoral line is noted. Opacity in the left base is identified. IMPRESSION: No ileus identified. Electronically Signed   By: Dorise Bullion III M.D   On: 07/17/2015 07:51   BMET    Component Value Date/Time   NA 138 07/18/2015 0720   NA 143 09/11/2014 1549   NA 143 01/05/2014 1334   NA 139 02/04/2008 1320   K 4.1 07/18/2015 0720  K 5.5* 01/05/2014 1334   K 4.3 02/04/2008 1320   CL 101 07/18/2015 0720   CL 112* 01/05/2014 1334   CL 102 02/04/2008 1320   CO2 24 07/18/2015 0326   CO2 21 01/05/2014 1334   CO2 28 02/04/2008 1320   GLUCOSE 96 07/18/2015 0720   GLUCOSE 200* 09/11/2014 1549   GLUCOSE 104* 01/05/2014 1334   GLUCOSE 240* 02/04/2008 1320   BUN 19 07/18/2015 0720   BUN 57* 09/11/2014 1549   BUN 88* 01/05/2014 1334   BUN 18 02/04/2008 1320   CREATININE 3.50* 07/18/2015 0720   CREATININE 3.52* 05/26/2014 1024   CREATININE 1.3* 02/04/2008 1320   CALCIUM 7.6* 07/18/2015 0326   CALCIUM 7.5* 11/06/2014 0421   CALCIUM 6.9* 05/26/2014 1024   CALCIUM 8.8 02/04/2008 1320   GFRNONAA 16* 07/18/2015 0326   GFRNONAA 17* 05/26/2014 1024   GFRNONAA 19* 01/05/2014 1334   GFRAA 18* 07/18/2015 0326   GFRAA 20* 05/26/2014 1024   GFRAA 23* 01/05/2014 1334   CBC    Component Value Date/Time   WBC  11.6* 07/18/2015 0722   WBC 4.9 03/18/2015 1539   WBC 4.7 05/26/2014 1024   RBC 3.16* 07/18/2015 0722   RBC 2.91* 03/18/2015 1539   RBC 3.02* 11/06/2014 0421   RBC 3.61* 05/26/2014 1024   RBC 5.35 03/23/2008 1117   HGB 8.9* 07/18/2015 0722   HGB 10.2* 05/26/2014 1024   HGB 15.8 03/23/2008 1117   HCT 26.7* 07/18/2015 0722   HCT 25.8* 03/18/2015 1539   HCT 30.6* 05/26/2014 1024   HCT 46.2 03/23/2008 1117   PLT 34* 07/18/2015 0722   PLT 99* 03/18/2015 1539   PLT 80* 05/26/2014 1024   PLT 93* 03/23/2008 1117   MCV 84.5 07/18/2015 0722   MCV 89 03/18/2015 1539   MCV 85 05/26/2014 1024   MCV 86 03/23/2008 1117   MCH 28.2 07/18/2015 0722   MCH 29.2 03/18/2015 1539   MCH 28.3 05/26/2014 1024   MCH 29.5 03/23/2008 1117   MCHC 33.3 07/18/2015 0722   MCHC 32.9 03/18/2015 1539   MCHC 33.4 05/26/2014 1024   MCHC 34.1 03/23/2008 1117   RDW 16.4* 07/18/2015 0722   RDW 16.8* 03/18/2015 1539   RDW 15.7* 05/26/2014 1024   RDW 12.7 03/23/2008 1117   LYMPHSABS 0.7* 05/25/2015 1343   LYMPHSABS 0.6* 09/11/2014 0000   LYMPHSABS 0.7* 05/26/2014 1024   LYMPHSABS 1.3 03/23/2008 1117   MONOABS 0.4 05/25/2015 1343   MONOABS 0.3 05/26/2014 1024   EOSABS 0.0 05/25/2015 1343   EOSABS 0.1 09/11/2014 0000   EOSABS 0.1 05/26/2014 1024   EOSABS 0.1 03/23/2008 1117   BASOSABS 0.0 05/25/2015 1343   BASOSABS 0.0 09/11/2014 0000   BASOSABS 0.0 05/26/2014 1024   BASOSABS 0.1 03/23/2008 1117     Assessment: 1. SP CABG 2. DM 3. Anemia 4. HTN 5. ESRD  TTS 6. Thrombocytopenia 7. Increased LFT's sec shock liver  Plan: 1.  Cont with CVVHD to keep even.  Metabolically stable.  Illiana Losurdo T

## 2015-07-18 NOTE — Progress Notes (Signed)
Pharmacy Antibiotic Note  Joseph [name] is a 70 y.o. male admitted on 06/21/2015 with open chest.  Pharmacy has been consulted for vanc dosing. Also on Zosyn per MD. MD has been dosing vanc daily, now wants Rx to take over. Low temps (CRRT), wbc stable 11.6. CRRT off 6/9 PM through 6/10 AM, back on with no issues, making some urine 170cc/24h.  AM VRs ordered by CVTS, level high (26) yesterday (no vanc given, CRRT had been off for 10 hours when level drawn) and back in range (14) this AM.  Plan: PVT wants Rx to dose vanc now, has been dosing daily Vanc 1250mg  (~10mg /kg) IV q24h Zosyn 3.375g IV q6h Monitor clinical progress, c/s, renal function, abx plan/LOT Vanc levels as indicated Monitor toleration of CRRT  Height: 5\' 11"  (180.3 cm) Weight: 270 lb 4.5 oz (122.6 kg) IBW/kg (Calculated) : 75.3  Temp (24hrs), Avg:97.3 F (36.3 C), Min:90.9 F (32.7 C), Max:99 F (37.2 C)   Recent Labs Lab 07/17/15 0300  07/17/15 0726 07/17/15 1258 07/17/15 1300 07/17/15 1537 07/17/15 1915 07/17/15 1923 07/18/15 0326 07/18/15 0720 07/18/15 0722  WBC  --   < > 12.3*  --  12.3*  --  11.4*  --  11.5*  --  11.6*  CREATININE  --   < >  --  4.40*  --  4.15*  --  3.80* 3.64* 3.50*  --   VANCORANDOM 26  --   --   --   --   --   --   --  14  --   --   < > = values in this interval not displayed.  Estimated Creatinine Clearance: 27.3 mL/min (by C-G formula based on Cr of 3.5).    Allergies  Allergen Reactions  . Sulfa Antibiotics Hives and Itching  . Morphine And Related     Makes patient feel weird    Antimicrobials this admission: 6/8 Vanc >> (Rx dosing 6/11>>) 6/9 Zosyn>>  Dose adjustments this admission: 6/10 AM VR (CVTS ordered): 26 (only received 2 doses, CRRT off x 10 hours when level drawn) 6/11 AM VR (CVTS): 14  Microbiology results: 6/10 TA: 6/7 surg mrsa pcr: neg   Elicia Lamp, PharmD, [facility] Clinical Pharmacist Pager [phone] 07/18/2015 10:34 AM

## 2015-07-19 ENCOUNTER — Inpatient Hospital Stay (HOSPITAL_COMMUNITY): Payer: Medicare Other

## 2015-07-19 ENCOUNTER — Encounter (HOSPITAL_COMMUNITY): Admission: EM | Disposition: E | Payer: Self-pay | Source: Home / Self Care | Attending: Cardiothoracic Surgery

## 2015-07-19 DIAGNOSIS — N179 Acute kidney failure, unspecified: Secondary | ICD-10-CM

## 2015-07-19 LAB — POCT I-STAT, CHEM 8
BUN: 26 mg/dL — ABNORMAL HIGH (ref 6–20)
BUN: 21 mg/dL — AB (ref 6–20)
BUN: 23 mg/dL — AB (ref 6–20)
BUN: 18 mg/dL (ref 6–20)
BUN: 20 mg/dL (ref 6–20)
BUN: 21 mg/dL — AB (ref 6–20)
CALCIUM ION: 0.79 mmol/L — AB (ref 1.13–1.30)
CALCIUM ION: 1.11 mmol/L — AB (ref 1.13–1.30)
CALCIUM ION: 1.24 mmol/L (ref 1.13–1.30)
CALCIUM ION: 1.1 mmol/L — AB (ref 1.13–1.30)
CALCIUM ION: 1.02 mmol/L — AB (ref 1.13–1.30)
CHLORIDE: 102 mmol/L (ref 101–111)
CHLORIDE: 104 mmol/L (ref 101–111)
CHLORIDE: 103 mmol/L (ref 101–111)
CHLORIDE: 101 mmol/L (ref 101–111)
CREATININE: 2.8 mg/dL — AB (ref 0.61–1.24)
CREATININE: 2.7 mg/dL — AB (ref 0.61–1.24)
Calcium, Ion: 1.03 mmol/L — ABNORMAL LOW (ref 1.13–1.30)
Chloride: 103 mmol/L (ref 101–111)
Chloride: 104 mmol/L (ref 101–111)
Creatinine, Ser: 2.8 mg/dL — ABNORMAL HIGH (ref 0.61–1.24)
Creatinine, Ser: 2.8 mg/dL — ABNORMAL HIGH (ref 0.61–1.24)
Creatinine, Ser: 3.1 mg/dL — ABNORMAL HIGH (ref 0.61–1.24)
Creatinine, Ser: 2.8 mg/dL — ABNORMAL HIGH (ref 0.61–1.24)
GLUCOSE: 307 mg/dL — AB (ref 65–99)
GLUCOSE: 109 mg/dL — AB (ref 65–99)
GLUCOSE: 109 mg/dL — AB (ref 65–99)
Glucose, Bld: 184 mg/dL — ABNORMAL HIGH (ref 65–99)
Glucose, Bld: 208 mg/dL — ABNORMAL HIGH (ref 65–99)
Glucose, Bld: 102 mg/dL — ABNORMAL HIGH (ref 65–99)
HCT: 34 % — ABNORMAL LOW (ref 39.0–52.0)
HCT: 28 % — ABNORMAL LOW (ref 39.0–52.0)
HCT: 30 % — ABNORMAL LOW (ref 39.0–52.0)
HEMATOCRIT: 27 % — AB (ref 39.0–52.0)
HEMATOCRIT: 27 % — AB (ref 39.0–52.0)
HEMATOCRIT: 27 % — AB (ref 39.0–52.0)
HEMOGLOBIN: 11.6 g/dL — AB (ref 13.0–17.0)
Hemoglobin: 9.2 g/dL — ABNORMAL LOW (ref 13.0–17.0)
Hemoglobin: 9.2 g/dL — ABNORMAL LOW (ref 13.0–17.0)
Hemoglobin: 9.5 g/dL — ABNORMAL LOW (ref 13.0–17.0)
Hemoglobin: 9.2 g/dL — ABNORMAL LOW (ref 13.0–17.0)
Hemoglobin: 10.2 g/dL — ABNORMAL LOW (ref 13.0–17.0)
POTASSIUM: 4.4 mmol/L (ref 3.5–5.1)
POTASSIUM: 3.7 mmol/L (ref 3.5–5.1)
POTASSIUM: 4.5 mmol/L (ref 3.5–5.1)
POTASSIUM: 4.5 mmol/L (ref 3.5–5.1)
Potassium: 3.4 mmol/L — ABNORMAL LOW (ref 3.5–5.1)
Potassium: 4.5 mmol/L (ref 3.5–5.1)
SODIUM: 141 mmol/L (ref 135–145)
SODIUM: 143 mmol/L (ref 135–145)
SODIUM: 144 mmol/L (ref 135–145)
SODIUM: 132 mmol/L — AB (ref 135–145)
SODIUM: 136 mmol/L (ref 135–145)
Sodium: 137 mmol/L (ref 135–145)
TCO2: 24 mmol/L (ref 0–100)
TCO2: 24 mmol/L (ref 0–100)
TCO2: 25 mmol/L (ref 0–100)
TCO2: 22 mmol/L (ref 0–100)
TCO2: 24 mmol/L (ref 0–100)
TCO2: 22 mmol/L (ref 0–100)

## 2015-07-19 LAB — POCT I-STAT 3, ART BLOOD GAS (G3+)
ACID-BASE DEFICIT: 1 mmol/L (ref 0.0–2.0)
ACID-BASE DEFICIT: 2 mmol/L (ref 0.0–2.0)
Acid-base deficit: 4 mmol/L — ABNORMAL HIGH (ref 0.0–2.0)
Acid-base deficit: 2 mmol/L (ref 0.0–2.0)
BICARBONATE: 22.6 meq/L (ref 20.0–24.0)
Bicarbonate: 24.3 mEq/L — ABNORMAL HIGH (ref 20.0–24.0)
Bicarbonate: 22.1 mEq/L (ref 20.0–24.0)
Bicarbonate: 22.3 mEq/L (ref 20.0–24.0)
O2 SAT: 97 %
O2 Saturation: 99 %
O2 Saturation: 99 %
O2 Saturation: 98 %
PCO2 ART: 66.4 mmHg — AB (ref 35.0–45.0)
PCO2 ART: 32.7 mmHg — AB (ref 35.0–45.0)
PH ART: 7.172 — AB (ref 7.350–7.450)
PH ART: 7.442 (ref 7.350–7.450)
PH ART: 7.436 (ref 7.350–7.450)
PO2 ART: 149 mmHg — AB (ref 80.0–100.0)
Patient temperature: 35.8
TCO2: 26 mmol/L (ref 0–100)
TCO2: 24 mmol/L (ref 0–100)
TCO2: 23 mmol/L (ref 0–100)
TCO2: 23 mmol/L (ref 0–100)
pCO2 arterial: 32.8 mmHg — ABNORMAL LOW (ref 35.0–45.0)
pCO2 arterial: 31.9 mmHg — ABNORMAL LOW (ref 35.0–45.0)
pH, Arterial: 7.447 (ref 7.350–7.450)
pO2, Arterial: 131 mmHg — ABNORMAL HIGH (ref 80.0–100.0)
pO2, Arterial: 102 mmHg — ABNORMAL HIGH (ref 80.0–100.0)
pO2, Arterial: 79 mmHg — ABNORMAL LOW (ref 80.0–100.0)

## 2015-07-19 LAB — COMPREHENSIVE METABOLIC PANEL
ALT: 2486 U/L — ABNORMAL HIGH (ref 17–63)
AST: 1236 U/L — ABNORMAL HIGH (ref 15–41)
Albumin: 2.2 g/dL — ABNORMAL LOW (ref 3.5–5.0)
Alkaline Phosphatase: 115 U/L (ref 38–126)
Anion gap: 10 (ref 5–15)
BUN: 16 mg/dL (ref 6–20)
CO2: 22 mmol/L (ref 22–32)
Calcium: 7.6 mg/dL — ABNORMAL LOW (ref 8.9–10.3)
Chloride: 103 mmol/L (ref 101–111)
Creatinine, Ser: 3.03 mg/dL — ABNORMAL HIGH (ref 0.61–1.24)
GFR calc Af Amer: 23 mL/min — ABNORMAL LOW (ref >60)
GFR calc non Af Amer: 20 mL/min — ABNORMAL LOW (ref >60)
Glucose, Bld: 116 mg/dL — ABNORMAL HIGH (ref 65–99)
Potassium: 4.7 mmol/L (ref 3.5–5.1)
Sodium: 135 mmol/L (ref 135–145)
Total Bilirubin: 6.2 mg/dL — ABNORMAL HIGH (ref 0.3–1.2)
Total Protein: 5.1 g/dL — ABNORMAL LOW (ref 6.5–8.1)

## 2015-07-19 LAB — GLUCOSE, CAPILLARY
GLUCOSE-CAPILLARY: 122 mg/dL — AB (ref 65–99)
GLUCOSE-CAPILLARY: 130 mg/dL — AB (ref 65–99)
GLUCOSE-CAPILLARY: 124 mg/dL — AB (ref 65–99)
GLUCOSE-CAPILLARY: 118 mg/dL — AB (ref 65–99)
GLUCOSE-CAPILLARY: 101 mg/dL — AB (ref 65–99)
GLUCOSE-CAPILLARY: 108 mg/dL — AB (ref 65–99)
GLUCOSE-CAPILLARY: 104 mg/dL — AB (ref 65–99)
GLUCOSE-CAPILLARY: 103 mg/dL — AB (ref 65–99)
GLUCOSE-CAPILLARY: 104 mg/dL — AB (ref 65–99)
GLUCOSE-CAPILLARY: 108 mg/dL — AB (ref 65–99)
Glucose-Capillary: 110 mg/dL — ABNORMAL HIGH (ref 65–99)
Glucose-Capillary: 113 mg/dL — ABNORMAL HIGH (ref 65–99)
Glucose-Capillary: 104 mg/dL — ABNORMAL HIGH (ref 65–99)
Glucose-Capillary: 96 mg/dL (ref 65–99)
Glucose-Capillary: 106 mg/dL — ABNORMAL HIGH (ref 65–99)

## 2015-07-19 LAB — CULTURE, RESPIRATORY W GRAM STAIN
Culture: NO GROWTH
Special Requests: NORMAL

## 2015-07-19 LAB — PREPARE PLATELET PHERESIS: Unit division: 0

## 2015-07-19 LAB — RENAL FUNCTION PANEL
ALBUMIN: 2.2 g/dL — AB (ref 3.5–5.0)
ALBUMIN: 2.1 g/dL — AB (ref 3.5–5.0)
Anion gap: 10 (ref 5–15)
Anion gap: 9 (ref 5–15)
BUN: 18 mg/dL (ref 6–20)
BUN: 20 mg/dL (ref 6–20)
CALCIUM: 7.5 mg/dL — AB (ref 8.9–10.3)
CHLORIDE: 103 mmol/L (ref 101–111)
CO2: 22 mmol/L (ref 22–32)
CO2: 22 mmol/L (ref 22–32)
CREATININE: 2.77 mg/dL — AB (ref 0.61–1.24)
Calcium: 7.6 mg/dL — ABNORMAL LOW (ref 8.9–10.3)
Chloride: 103 mmol/L (ref 101–111)
Creatinine, Ser: 2.94 mg/dL — ABNORMAL HIGH (ref 0.61–1.24)
GFR calc Af Amer: 24 mL/min — ABNORMAL LOW (ref >60)
GFR calc non Af Amer: 21 mL/min — ABNORMAL LOW (ref >60)
GFR, EST AFRICAN AMERICAN: 26 mL/min — AB (ref >60)
GFR, EST NON AFRICAN AMERICAN: 22 mL/min — AB (ref >60)
GLUCOSE: 113 mg/dL — AB (ref 65–99)
Glucose, Bld: 103 mg/dL — ABNORMAL HIGH (ref 65–99)
PHOSPHORUS: 4 mg/dL (ref 2.5–4.6)
PHOSPHORUS: 4.3 mg/dL (ref 2.5–4.6)
POTASSIUM: 4.7 mmol/L (ref 3.5–5.1)
Potassium: 4.5 mmol/L (ref 3.5–5.1)
SODIUM: 135 mmol/L (ref 135–145)
Sodium: 134 mmol/L — ABNORMAL LOW (ref 135–145)

## 2015-07-19 LAB — VANCOMYCIN, RANDOM: Vancomycin Rm: 20 ug/mL

## 2015-07-19 LAB — CBC
HCT: 28.3 % — ABNORMAL LOW (ref 39.0–52.0)
HCT: 28.7 % — ABNORMAL LOW (ref 39.0–52.0)
HCT: 29 % — ABNORMAL LOW (ref 39.0–52.0)
Hemoglobin: 9.4 g/dL — ABNORMAL LOW (ref 13.0–17.0)
Hemoglobin: 9.5 g/dL — ABNORMAL LOW (ref 13.0–17.0)
Hemoglobin: 9.6 g/dL — ABNORMAL LOW (ref 13.0–17.0)
MCH: 28.1 pg (ref 26.0–34.0)
MCH: 28.1 pg (ref 26.0–34.0)
MCH: 28.4 pg (ref 26.0–34.0)
MCHC: 33.2 g/dL (ref 30.0–36.0)
MCHC: 33.1 g/dL (ref 30.0–36.0)
MCHC: 33.1 g/dL (ref 30.0–36.0)
MCV: 84.7 fL (ref 78.0–100.0)
MCV: 84.9 fL (ref 78.0–100.0)
MCV: 85.8 fL (ref 78.0–100.0)
Platelets: 40 10*3/uL — ABNORMAL LOW (ref 150–400)
Platelets: 36 10*3/uL — ABNORMAL LOW (ref 150–400)
Platelets: 31 10*3/uL — ABNORMAL LOW (ref 150–400)
RBC: 3.34 MIL/uL — ABNORMAL LOW (ref 4.22–5.81)
RBC: 3.38 MIL/uL — ABNORMAL LOW (ref 4.22–5.81)
RBC: 3.38 MIL/uL — ABNORMAL LOW (ref 4.22–5.81)
RDW: 16.6 % — ABNORMAL HIGH (ref 11.5–15.5)
RDW: 16.7 % — ABNORMAL HIGH (ref 11.5–15.5)
RDW: 16.7 % — ABNORMAL HIGH (ref 11.5–15.5)
WBC: 15.6 10*3/uL — ABNORMAL HIGH (ref 4.0–10.5)
WBC: 14.9 10*3/uL — ABNORMAL HIGH (ref 4.0–10.5)
WBC: 16.5 10*3/uL — ABNORMAL HIGH (ref 4.0–10.5)

## 2015-07-19 LAB — CARBOXYHEMOGLOBIN
Carboxyhemoglobin: 1.1 % (ref 0.5–1.5)
Methemoglobin: 1.3 % (ref 0.0–1.5)
O2 Saturation: 75.6 %
Total hemoglobin: 9.6 g/dL — ABNORMAL LOW (ref 13.5–18.0)

## 2015-07-19 LAB — PROCALCITONIN: PROCALCITONIN: 40.69 ng/mL

## 2015-07-19 LAB — MAGNESIUM: Magnesium: 2.2 mg/dL (ref 1.7–2.4)

## 2015-07-19 SURGERY — CLOSURE, STERNUM
Anesthesia: General | Site: Chest

## 2015-07-19 MED ORDER — NEPRO/CARBSTEADY PO LIQD
1000.0000 mL | ORAL | Status: DC
  Administered 2015-07-20 – 2015-07-21 (×2): 1000 mL
  Filled 2015-07-19 (×2): qty 1000

## 2015-07-19 MED ORDER — DIATRIZOATE MEGLUMINE & SODIUM 66-10 % PO SOLN
30.0000 mL | Freq: Once | ORAL | Status: DC
  Filled 2015-07-19: qty 30

## 2015-07-19 MED ORDER — METHYLPREDNISOLONE SODIUM SUCC 125 MG IJ SOLR: 40.0000 mg | Freq: Two times a day (BID) | INTRAMUSCULAR | Status: DC

## 2015-07-19 MED ORDER — METHYLPREDNISOLONE SODIUM SUCC 125 MG IJ SOLR
40.0000 mg | Freq: Two times a day (BID) | INTRAMUSCULAR | Status: AC
  Administered 2015-07-19: 40 mg via INTRAVENOUS
  Filled 2015-07-19: qty 2

## 2015-07-19 MED ORDER — PRO-STAT SUGAR FREE PO LIQD
60.0000 mL | Freq: Four times a day (QID) | ORAL | Status: DC
  Administered 2015-07-20 – 2015-07-23 (×12): 60 mL via ORAL
  Filled 2015-07-19 (×12): qty 60

## 2015-07-19 MED ORDER — LIDOCAINE VISCOUS 2 % MT SOLN
OROMUCOSAL | Status: AC
  Filled 2015-07-19: qty 15

## 2015-07-19 MED ORDER — DIATRIZOATE MEGLUMINE & SODIUM 66-10 % PO SOLN
ORAL | Status: AC
  Administered 2015-07-19: 30 mL
  Filled 2015-07-19: qty 30

## 2015-07-19 NOTE — Progress Notes (Signed)
Patient ID: Joseph [name], male   DOB: 1945/03/04, 70 y.o.   MRN: [id number] Hematology: I have been following clinical course. I have nothing to add to the superb management by Dr Darcey Nora. Difficult re-do CABG. Intra-operative coagulopathy/DIC complicating pre-existing thrombocytopenia. Massive transfusion of RBC 18 units, cryo 6 units, plasma 8 units, platelets 11 units and need to re-explore for continued bleeding. Additional platelets given 6/9,10,11 for dilutional thrombocytopenia. RBC transfusion requirements are decreasing: 2 units 6/9, 2 6/10, none 6/11. Hb stable  >/= 9 gms. Platelets @ 36,-40,000 with transfusion support. Most recent fibrinogen 245 on 6/9. Holding anticoagulants and antiplatelet agents. He remains critically ill.

## 2015-07-19 NOTE — Progress Notes (Signed)
PULMONARY / CRITICAL CARE MEDICINE   Name: Joseph [name] MRN: [id number] DOB: 05/19/1945    ADMISSION DATE:  06/26/2015 CONSULTATION DATE:  07/16/2015  REFERRING MD:  Nils Pyle  CHIEF COMPLAINT:  Post cardiothoracic surgery ventilator management  HISTORY OF PRESENT ILLNESS:   69 y/o male with ESRD and significant CAD (CABG 2001, DES 2016, EF 40-45%) was admitted on 5/29 with NSTEMI and had persistent chest pain during his hospitalization so he was taken for a redo CABG on 6/8 (SVG to diag and SVG to OM, IABP placement and CVL placement).  Post operatively he had excessive bleeding from all chest drains (pleural and mediastinal) so he was taken back to the OR on 6/9 for re-exploration.  PCCM was consulted for ventilator management.    SUBJECTIVE:  Remains critically ill, on vent, IABP, and CVVHD pulling around 164ml/hr. Slight improvement in weaning pressors - epi, levophed, vasopressin. On FiO2 of 0.70 today/PaO2 79, improving with volume removal, increasing R pleural effusion. No plan to return to OR for sternal closing until tissue edema and cardiac function improved per CVTS.  VITAL SIGNS: BP 118/55 mmHg  Pulse 88  Temp(Src) 96.6 F (35.9 C) (Core (Comment))  Resp 9  Ht 5\' 11"  (1.803 m)  Wt 263 lb 7.2 oz (119.5 kg)  BMI 36.76 kg/m2  SpO2 87%  HEMODYNAMICS: PAP: (27-47)/(17-29) 33/18 mmHg CVP:  [12 mmHg-23 mmHg] 12 mmHg CO:  [4.1 L/min-7 L/min] 5.5 L/min CI:  [1.9 L/min/m2-3.2 L/min/m2] 2.6 L/min/m2  VENTILATOR SETTINGS: Vent Mode:  [-] PRVC FiO2 (%):  [70 %-80 %] 70 % Set Rate:  [35 bmp] 35 bmp Vt Set:  [550 mL] 550 mL PEEP:  [5 cmH20] 5 cmH20 Plateau Pressure:  [24 [id number] cmH20] 24 cmH20  INTAKE / OUTPUT: I/O last 3 completed shifts: In: 9157.8 [I.V.:7922.8; Blood:385; Other:30; NG/GT:120; IV [id number] Out: [id number] [Urine:150; Emesis/NG output:250; [id number]; Chest Tube:920]  PHYSICAL EXAMINATION: General:  Critically ill on vent, CVVHD Neuro:  Heavily  sedated / paralyzed on vent HEENT:  Diffuse facial edema, ETT in place Cardiovascular:  Balloon pump, median sternotomy dressing dry, atrial paced, decreased drainage out of mediastinal and pleural tubes Lungs:  resps even non labored on vent, rales throughout, diminished bibasilar  Abdomen:  Distended, some focal superficial bruising, -bs  Musculoskeletal:  No bony abnormalities Skin:  Bruising all over extensor surfaces and belly, generalized 3++ edema  LABS:  BMET  Recent Labs Lab 07/18/15 0326  07/18/15 1546  07/15/2015 0250 07/16/2015 0643 07/21/2015 1333  NA 138  < > 134*  < > 135  135 132* 136  K 4.0  < > 5.3*  < > 4.7  4.7 4.5 4.5  CL 104  < > 103  < > 103  103 104 103  CO2 24  --  23  --  22  22  --   --   BUN 20  < > 17  < > 16  18 18 20   CREATININE 3.64*  < > 3.09*  < > 3.03*  2.94* 2.80* 2.80*  GLUCOSE 103*  < > 116*  < > 116*  113* 109* 102*  < > = values in this interval not displayed.  Electrolytes  Recent Labs Lab 07/17/15 0301  07/18/15 0326 07/18/15 1546 07/26/2015 0250  CALCIUM 8.2*  < > 7.6* 7.6* 7.6*  7.6*  MG 1.8  --  2.2  --  2.2  PHOS 5.0*  < > 4.2 4.5 4.0  < > = values in  this interval not displayed.  CBC  Recent Labs Lab 07/18/15 1930  07/24/2015 0250 07/09/2015 0643 07/16/2015 0646 07/29/2015 1333  WBC 14.3*  --  15.6*  --  14.9*  --   HGB 9.5*  < > 9.4* 9.5* 9.5* 9.2*  HCT 28.7*  < > 28.3* 28.0* 28.7* 27.0*  PLT 44*  --  40*  --  36*  --   < > = values in this interval not displayed.  Coag's  Recent Labs Lab 07/22/2015 1929 07/14/2015 2050 07/16/15 0053  APTT 142* 94* 52*  INR 1.60* 1.43 1.05    Sepsis Markers No results for input(s): LATICACIDVEN, PROCALCITON, O2SATVEN in the last 168 hours.  ABG  Recent Labs Lab 07/23/2015 0124 07/10/2015 0303 08/02/2015 0640  PHART 7.442 7.436 7.447  PCO2ART 32.8* 32.7* 31.9*  PO2ART 131.0* 102.0* 79.0*    Liver Enzymes  Recent Labs Lab 07/17/15 0301  07/18/15 0326 07/18/15 1546  07/26/2015 0250  AST 3032*  --  2641*  --  1236*  ALT 3212*  --  3386*  --  2486*  ALKPHOS 123  --  126  --  115  BILITOT 3.4*  --  6.0*  --  6.2*  ALBUMIN 2.4*  < > 2.4* 2.3* 2.2*  2.2*  < > = values in this interval not displayed.  Cardiac Enzymes  Recent Labs Lab 07/13/15 0040 07/13/15 0650 07/14/15 2330  TROPONINI 0.07* 0.08* 0.15*    Glucose  Recent Labs Lab 07/18/15 1707 07/18/15 1744 07/18/15 1837 07/18/15 2123 07/10/2015 0121 07/22/2015 0255  GLUCAP 128* 128* 122* 130* 124* 118*    Imaging Dg Chest Port 1 View  07/15/2015  CLINICAL DATA:  Intubation. EXAM: PORTABLE CHEST 1 VIEW COMPARISON:  07/18/2015. FINDINGS: Endotracheal tube, NG tube, Swan-Ganz catheter, bilateral chest tubes, mediastinal drainage catheter, aortic balloon pump in stable position. Stable cardiomegaly. Persistent bibasilar atelectasis and/or infiltrates. Small right pleural pleural effusion cannot be excluded. No pneumothorax. Surgical staples noted over the mid and right chest. IMPRESSION: 1. Lines and tubes in stable position. 2. Prior CABG. Stable cardiomegaly. No pulmonary venous congestion. 3. Low lung volumes with bibasilar atelectasis. Small right pleural effusion cannot be excluded. Electronically Signed   By: Marcello Moores  Register   On: 07/22/2015 07:33     STUDIES:  6/8 TEE >  CULTURES: Sputum 6/10 >> neg  ANTIBIOTICS: 6/8 Vanc >> 6/10 Zosyn >>  SIGNIFICANT EVENTS: 6/8 Redo CABG 6/8 take back to OR emergently for diffuse bleeding  LINES/TUBES: Left forearm fistula 6/8 R IJ introducer >> 6/8 Multiple chest tubes (mediastinal and pleural bilateral) >> 6/8 CVC left femoral vein >> 6/8 R radial arterial line >> 6/8 L IJ Gordy Councilman >>  DISCUSSION: 70 y/o male s/p redo CABG 6/8 complicated by significant post operative bleeding in the setting of thrombocytopenia brought back to the Surgical ICU on 6/9 with hypoxemic respiratory failure.  He has baseline ESRD and DM2.  ASSESSMENT /  PLAN:  PULMONARY A: Acute respiratory failure with hypoxemia> Pulm edema, at risk for TRALI or TACO Post operative pleural bleeding Respiratory acidosis - suspect mostly V/Q mismatch from cardiogenic shock, improved Small R Pleural Effusion P:   Volume removal as able per CVVHD Wean FiO2 for PaO2 >80 F/u ABG in am  Consider rate adjustment in am with ABG review Continue nimbex for now with open sternum, severe ARDS  Intermittent CXR Chest tubes per thoracic  CARDIOVASCULAR A:  Severe Cardiogenic and hemorrhagic shock CAD s/p redo CABG P:  Vasopressors per TCTS - on epi gtt, levophed, vasopressin, milrinone.  Wean as tolerated  IABP per cardiology/TCTS Pacing per TCTS Tele monitoring   RENAL A:   ESRD P:   Continue CVVHD per renal -- tolerating volume removal on pressors Renal following   GASTROINTESTINAL A:   No acute issues P:   OG tube H2 blocker for stress ulcer prophylaxis  HEMATOLOGIC A:   Diffuse oozing due to coagulopathy from blood loss, thrombocytopenia P:  Transfuse per TCTS guidelines  Trend CBC, coags   INFECTIOUS A:   ?HCAP - doubt, respiratory culture negative P:   Empiric abx ordered per CVTS Monitor fever curve / WBC Assess PCT, consider 7 days abx  D5/x abx   ENDOCRINE A:   DM2   P:   Monitor glucose Insulin gtt per TCTS post op protocol  NEUROLOGIC A:   Sedation needs for vent synchrony P:   RASS goal: -5 Fentanyl gtt  Versed gtt Nimbex > all sedation and paralytic per protocol (BIS monitoring, train of four)  FAMILY  - Updates:  No family at bedside 6/12.  Updated per TCTS 6/11.   - Inter-disciplinary family meet or Palliative Care meeting due by:  day [place], NP-C [facility] Pgr: [phone] or if no answer [phone] 07/24/2015, 3:03 PM  Attending Note:  70 year old male s/p CABG, bled, redo CABG then left open.  On exam, patient is sedated and paralyzed.  Lungs are coarse BS  diffusely.  I reviewed CXR myself, pulmonary edema noted but improving.  Discussed with Dr. Nils Pyle, will likely close on Wednesday.  Maintain sedated/paralyzed for now.  Place cork-track in patient and begin TF.  Hold weaning for now.  Increase PEEP to 8 and FiO2 decrease FiO2 to 60% and follow.ABG.  Will f/u in AM.  The patient is critically ill with multiple organ systems failure and requires high complexity decision making for assessment and support, frequent evaluation and titration of therapies, application of advanced monitoring technologies and extensive interpretation of multiple databases.   Critical Care Time devoted to patient care services described in this note is  35  Minutes. This time reflects time of care of this signee Dr [name]. This critical care time does not reflect procedure time, or teaching time or supervisory time of PA/NP/Med student/Med Resident etc but could involve care discussion time.  Rush Farmer, M.D. [facility] Pulmonary/Critical Care Medicine. Pager: [phone]. After hours pager: [phone].

## 2015-07-19 NOTE — Progress Notes (Signed)
Nutrition Follow-up  DOCUMENTATION CODES:   Obesity unspecified  INTERVENTION:  Initiate Nepro formula at goal rate of 20 ml/h (480 ml per day) and Prostat 60 ml QID to provide 1664 kcals, 159 gm protein, 349 ml free water daily.   NUTRITION DIAGNOSIS:   Inadequate oral intake related to inability to eat as evidenced by NPO status. Ongoing  GOAL:   Patient will meet greater than or equal to 90% of their needs Unmet   MONITOR:   TF tolerance, Vent status, Labs, Weight trends, Skin, I & O's  REASON FOR ASSESSMENT:   Consult Enteral/tube feeding initiation and management  ASSESSMENT:   Pt s/p redo CABG 6/8 complicated by significant post operative bleeding in the setting of thrombocytopenia brought back to the Surgical ICU on 6/9 with hypoxemic respiratory failure. He has baseline ESRD and DM2.  Patient s/p procedure 6/8: EXPLORATION POST OPERATIVE OPEN HEART   Pt remains on vent support. Temp (24hrs), Avg:97.5 F (36.4 C), Min:96.3 F (35.7 C), Max:99.5 F (37.5 C) On CVVHD. Some progress on weaning pressors.  Paralyzed. Chest tube 1&2 with 510 ml output 6/11-6/12.   Orders in for cortrak tube. Initiate tube feeding at goal rate.   Weight since admission: 205 lbs-->263 lbs, +4 L fluid.   Labs reviewed; creat 2.8, CBGs 118-130. Meds reviewed; mag sulfate, MVI    Diet Order:  Diet NPO time specified  Skin:  Reviewed, no issues  Last BM:  6/7  Height:   Ht Readings from Last 1 Encounters:  06/09/2015 5\' 11"  (1.803 m)    Weight:   Wt Readings from Last 1 Encounters:  07/24/2015 263 lb 7.2 oz (119.5 kg)    Ideal Body Weight:  78.1 kg  BMI:  Body mass index is 36.76 kg/(m^2).  Estimated Nutritional Needs:   Kcal:  [id number]  Protein:  150-160 g  Fluid:  per MD  EDUCATION NEEDS:   No education needs identified at this time  Geoffery Lyons, [place] Dietetic Intern Pager [phone]

## 2015-07-19 NOTE — Progress Notes (Signed)
Patient ID: Joseph [name], male   DOB: [birth year], 70 y.o.   MRN: [id number]  SICU Evening Rounds:  Hemodynamically stable on Milrinone 0.125, levophed 6, vaso .03, and epi 15 with IABP. CI 2.6.  CRRT continues and able to remove 100 cc/hr. Anuric  Remains chemically paralyzed and sedated on vent.  BMP Latest Ref Rng 08/01/2015 07/21/2015 07/16/2015  Glucose 65 - 99 mg/dL 103(H) 102(H) 109(H)  BUN 6 - 20 mg/dL 20 20 18   Creatinine 0.61 - 1.24 mg/dL 2.77(H) 2.80(H) 2.80(H)  Sodium 135 - 145 mmol/L 134(L) 136 132(L)  Potassium 3.5 - 5.1 mmol/L 4.5 4.5 4.5  Chloride 101 - 111 mmol/L 103 103 104  CO2 22 - 32 mmol/L 22 - -  Calcium 8.9 - 10.3 mg/dL 7.5(L) - -   CBC Latest Ref Rng 08/02/2015 07/23/2015 07/20/2015  WBC 4.0 - 10.5 K/uL 16.5(H) - 14.9(H)  Hemoglobin 13.0 - 17.0 g/dL 9.6(L) 9.2(L) 9.5(L)  Hematocrit 39.0 - 52.0 % 29.0(L) 27.0(L) 28.7(L)  Platelets 150 - 400 K/uL 31(L) - 36(L)    Thrombocytopenia secondary to IABP. No signs of bleeding so will observe for now. Probably need to give some in the morning.  Chest tube output low.

## 2015-07-19 NOTE — Progress Notes (Signed)
[facility] ROUNDING NOTE   Subjective:   Interval History:  Continues on CRRT   Objective:  Vital signs in last 24 hours:  Temp:  [96.3 F (35.7 C)-99.7 F (37.6 C)] 96.8 F (36 C) (06/12 1015) Pulse Rate:  [35-99] 88 (06/12 1015) Resp:  [0-35] 35 (06/12 1015) BP: (75-148)/(42-59) 118/55 mmHg (06/12 0400) SpO2:  [82 %-100 %] 99 % (06/12 1015) Arterial Line BP: (75-164)/(44-68) 140/60 mmHg (06/12 1015) FiO2 (%):  [70 %-80 %] 70 % (06/12 1015) Weight:  [119.5 kg (263 lb 7.2 oz)] 119.5 kg (263 lb 7.2 oz) (06/12 0630)  Weight change: -3.1 kg (-6 lb 13.3 oz) Filed Weights   07/16/15 0645 07/18/15 0600 07/29/2015 0630  Weight: 119.5 kg (263 lb 7.2 oz) 122.6 kg (270 lb 4.5 oz) 119.5 kg (263 lb 7.2 oz)    Intake/Output: I/O last 3 completed shifts: In: 9157.8 [I.V.:7922.8; Blood:385; Other:30; NG/GT:120; IV WOEHOZYYQ:825] Out: 00370 [Urine:150; Emesis/NG output:250; WUGQB:1694; Chest Tube:920]   Intake/Output this shift:  Total I/O In: 888.6 [I.V.:508.6; NG/GT:30; IV Piggyback:350] Out: 904 [Urine:6; Other:838; Chest Tube:60]  Gen: intubated, sedated CVS: RRR  Resp: Decreased BS bases Abd: No BS, + distention + chest tubes Ext: + edema, Lt forearm AVF + bruit NEURO: Sedated Aortic balloon pump Lt fem HD cath   Basic Metabolic Panel:  Recent Labs Lab 07/16/15 0520  07/16/15 1507  07/17/15 0301  07/17/15 1537  07/18/15 0326  07/18/15 1238 07/18/15 1546 07/18/15 1939 07/30/2015 0250 07/18/2015 0643  NA 142  < > 142  < > 141  < > 140  < > 138  < > 136 134* 134* 135  135 132*  K 4.2  < > 4.9  < > 4.1  < > 3.7  < > 4.0  < > 4.8 5.3* 5.1 4.7  4.7 4.5  CL 108  < > 111  < > 106  < > 105  < > 104  < > 101 103 101 103  103 104  CO2 23  --  19*  --  19*  --  24  --  24  --   --  23  --  22  22  --   GLUCOSE 139*  < > 116*  < > 163*  < > 122*  < > 103*  < > 101* 116* 124* 116*  113* 109*  BUN 21*  < > 22*  < > 26*  < > 25*  < > 20  < > '18 17 18 16  18  18  '$ CREATININE 3.59*  < > 3.77*  3.72*  < > 4.75*  < > 4.15*  < > 3.64*  < > 3.20* 3.09* 3.00* 3.03*  2.94* 2.80*  CALCIUM 9.0  --  8.2*  --  8.2*  --  7.9*  --  7.6*  --   --  7.6*  --  7.6*  7.6*  --   MG 1.5*  --  1.8  --  1.8  --   --   --  2.2  --   --   --   --  2.2  --   PHOS  --   --  6.4*  --  5.0*  --  4.5  --  4.2  --   --  4.5  --  4.0  --   < > = values in this interval not displayed.  Liver Function Tests:  Recent Labs Lab 07/14/15 5038  07/16/15 2140  07/17/15 0301 07/17/15 1537 07/18/15 0326 07/18/15 1546 08/04/2015 0250  AST 28  --  3011* 3032*  --  2641*  --  1236*  ALT 26  --  2983* 3212*  --  3386*  --  2486*  ALKPHOS 74  --  113 123  --  126  --  115  BILITOT 0.9  --  3.3* 3.4*  --  6.0*  --  6.2*  PROT 7.7  --  4.4* 4.6*  --  4.7*  --  5.1*  ALBUMIN 3.7  < > 2.5* 2.4* 2.4* 2.4* 2.3* 2.2*  2.2*  < > = values in this interval not displayed. No results for input(s): LIPASE, AMYLASE in the last 168 hours. No results for input(s): AMMONIA in the last 168 hours.  CBC:  Recent Labs Lab 07/18/15 0722  07/18/15 1245 07/18/15 1930 07/18/15 1939 08/04/2015 0250 07/26/2015 0643 07/14/2015 0646  WBC 11.6*  --  12.7* 14.3*  --  15.6*  --  14.9*  HGB 8.9*  < > 9.1* 9.5* 9.9* 9.4* 9.5* 9.5*  HCT 26.7*  < > 26.6* 28.7* 29.0* 28.3* 28.0* 28.7*  MCV 84.5  --  83.1 86.2  --  84.7  --  84.9  PLT 34*  --  30* 44*  --  40*  --  36*  < > = values in this interval not displayed.  Cardiac Enzymes:  Recent Labs Lab 07/12/15 2151 07/13/15 0040 07/13/15 0650 07/14/15 2330  TROPONINI 0.06* 0.07* 0.08* 0.15*    BNP: Invalid input(s): POCBNP  CBG:  Recent Labs Lab 07/18/15 1744 07/18/15 1837 07/18/15 2123 07/14/2015 0121 08/04/2015 0255  GLUCAP 128* 122* 130* 124* 118*    Microbiology: Results for orders placed or performed during the hospital encounter of 06/26/2015  MRSA PCR Screening     Status: None   Collection Time: 06/19/2015  8:50 PM  Result Value Ref  Range Status   MRSA by PCR NEGATIVE NEGATIVE Final    Comment:        The GeneXpert MRSA Assay (FDA approved for NASAL specimens only), is one component of a comprehensive MRSA colonization surveillance program. It is not intended to diagnose MRSA infection nor to guide or monitor treatment for MRSA infections.   Surgical pcr screen     Status: None   Collection Time: 07/14/15  6:22 PM  Result Value Ref Range Status   MRSA, PCR NEGATIVE NEGATIVE Final   Staphylococcus aureus NEGATIVE NEGATIVE Final    Comment:        The Xpert SA Assay (FDA approved for NASAL specimens in patients over 63 years of age), is one component of a comprehensive surveillance program.  Test performance has been validated by [facility] for patients greater than or equal to 36 year old. It is not intended to diagnose infection nor to guide or monitor treatment.   Culture, respiratory (NON-Expectorated)     Status: None   Collection Time: 07/17/15  4:51 AM  Result Value Ref Range Status   Specimen Description TRACHEAL ASPIRATE  Final   Special Requests Normal  Final   Gram Stain   Final    RARE WBC PRESENT, PREDOMINANTLY MONONUCLEAR NO ORGANISMS SEEN    Culture NO GROWTH 2 DAYS  Final   Report Status 08/04/2015 FINAL  Final    Coagulation Studies: No results for input(s): LABPROT, INR in the last 72 hours.  Urinalysis: No results for input(s): COLORURINE, LABSPEC, PHURINE, GLUCOSEU, HGBUR, BILIRUBINUR, KETONESUR, PROTEINUR, UROBILINOGEN, NITRITE,  LEUKOCYTESUR in the last 72 hours.  Invalid input(s): APPERANCEUR    Imaging: Dg Chest Port 1 View  08/05/2015  CLINICAL DATA:  Intubation. EXAM: PORTABLE CHEST 1 VIEW COMPARISON:  07/18/2015. FINDINGS: Endotracheal tube, NG tube, Swan-Ganz catheter, bilateral chest tubes, mediastinal drainage catheter, aortic balloon pump in stable position. Stable cardiomegaly. Persistent bibasilar atelectasis and/or infiltrates. Small right pleural pleural  effusion cannot be excluded. No pneumothorax. Surgical staples noted over the mid and right chest. IMPRESSION: 1. Lines and tubes in stable position. 2. Prior CABG. Stable cardiomegaly. No pulmonary venous congestion. 3. Low lung volumes with bibasilar atelectasis. Small right pleural effusion cannot be excluded. Electronically Signed   By: Marcello Moores  Register   On: 08/04/2015 07:33   Dg Chest Port 1 View  07/18/2015  CLINICAL DATA:  Status post CABG EXAM: PORTABLE CHEST 1 VIEW COMPARISON:  July 17, 2015 FINDINGS: Stable support apparatus with no pneumothorax. Increasing effusion and underlying opacity on the right. Stable effusion and atelectasis on the left. Mild pulmonary edema. No other changes. IMPRESSION: Increasing effusion and underlying atelectasis on the right. Stable effusion and atelectasis on the left. Stable support apparatus and mild edema. Electronically Signed   By: Dorise Bullion III M.D   On: 07/18/2015 09:13     Medications:   . sodium chloride    . sodium chloride    . sodium chloride 20 mL/hr at 07/13/2015 0700  . amiodarone 30 mg/hr (07/28/2015 1000)  . cisatracurium (NIMBEX) infusion 5 mcg/kg/min (07/21/2015 1000)  . EPINEPHrine 4 mg in dextrose 5% 250 mL infusion (16 mcg/mL) 15 mcg/min (07/17/2015 1000)  . fentaNYL infusion INTRAVENOUS 200 mcg/hr (07/20/2015 1000)  . insulin (NOVOLIN-R) infusion 5.2 Units/hr (07/22/2015 0900)  . lactated ringers 20 mL/hr at 07/14/2015 2200  . lactated ringers 20 mL/hr at 07/08/2015 2200  . lidocaine Stopped (07/16/15 0800)  . midazolam (VERSED) infusion 5 mg/hr (08/01/2015 1000)  . milrinone 0.125 mcg/kg/min (07/23/2015 1000)  . norepinephrine (LEVOPHED) Adult infusion 6 mcg/min (07/27/2015 1000)  . dialysis replacement fluid (prismasate) 400 mL/hr at 08/02/2015 0753  . dialysis replacement fluid (prismasate) 200 mL/hr at 07/14/2015 0729  . dialysate (PRISMASATE) 1,500 mL/hr at 07/28/2015 0753  . vasopressin (PITRESSIN) infusion - *FOR SHOCK* 0.03 Units/min  (07/08/2015 1000)   . sodium chloride   Intravenous Once  . sodium chloride   Intravenous Once  . amiodarone  150 mg Intravenous Once  . antiseptic oral rinse  7 mL Mouth Rinse 10 times per day  . artificial tears  1 application Both Eyes H4L  . bisacodyl  10 mg Oral Daily   Or  . bisacodyl  10 mg Rectal Daily  . calcium acetate  1,334 mg Oral TID WC  . chlorhexidine gluconate (SAGE KIT)  15 mL Mouth Rinse BID  . cisatracurium  0.05 mg/kg Intravenous Once  . docusate sodium  200 mg Oral Daily  . famotidine (PEPCID) IV  20 mg Intravenous Q24H  . fentaNYL (SUBLIMAZE) injection  100 mcg Intravenous Once  . insulin regular  0-10 Units Intravenous TID WC  . magic mouthwash  10 mL Oral TID  . magnesium sulfate  4 g Intravenous Once  . methylPREDNISolone (SOLU-MEDROL) injection  80 mg Intravenous Q12H  . midazolam  2 mg Intravenous Once  . multivitamin  1 tablet Oral QHS  . pantoprazole  40 mg Oral Daily  . piperacillin-tazobactam (ZOSYN)  IV  3.375 g Intravenous Q6H  . sodium chloride flush  3 mL Intravenous Q12H  . vancomycin  1,250 mg Intravenous Q24H   sodium chloride, anticoagulant sodium citrate, fentaNYL, fentaNYL (SUBLIMAZE) injection, heparin, lactated ringers, metoprolol, midazolam, midazolam, midazolam, ondansetron (ZOFRAN) IV, sodium chloride, sodium chloride flush, traMADol  Assessment/ Plan:  Redo CABG 2 with preoperative hemodialysis dependent renal failure and chronic thrombocytopenia-. Massive transfusion requirements for severe peri Operative coagulopathy. Now with significant fluid retention, vasodilatation with high cardiac output and CRT therapy to remove volume as tolerated by blood pressure 1. SP CABG stable weaning pressors  2. DM  Controlled by primary team  3. Anemia  Controlled   Hb 9.5  4. HTN / volume will try to remove 100- 150 cc per hour  5. ESRD TTS  Usual  6. Thrombocytopenia  Appreciate help from Dr Beryle Beams   LOS: 13 Garnetta Fedrick W '@TODAY''@10'$ :30  AM

## 2015-07-19 NOTE — Progress Notes (Signed)
4 Days Post-Op Procedure(s) (LRB): EXPLORATION POST OPERATIVE OPEN HEART (N/A) Subjective: Blood pressure improving with less pressor requirement CRT removing 100 cc fluid per hour Patient not ready for sternal closure-we'll plan later in the week after more fluid is removed to optimize RV function Objective: Vital signs in last 24 hours: Temp:  [96.3 F (35.7 C)-98.8 F (37.1 C)] 97.2 F (36.2 C) (06/12 1800) Pulse Rate:  [35-97] 88 (06/12 1800) Cardiac Rhythm:  [-] Atrial paced (06/12 1800) Resp:  [0-35] 0 (06/12 1800) BP: (118-148)/(55-59) 118/55 mmHg (06/12 0400) SpO2:  [82 %-100 %] 96 % (06/12 1800) Arterial Line BP: (90-225)/(47-220) 148/61 mmHg (06/12 1800) FiO2 (%):  [50 %-80 %] 50 % (06/12 1800) Weight:  [263 lb 7.2 oz (119.5 kg)] 263 lb 7.2 oz (119.5 kg) (06/12 0630)  Hemodynamic parameters for last 24 hours: PAP: (27-45)/(16-28) 35/22 mmHg CVP:  [8 mmHg-23 mmHg] 14 mmHg CO:  [4.1 L/min-6.3 L/min] 5.5 L/min CI:  [1.9 L/min/m2-3 L/min/m2] 2.6 L/min/m2  Intake/Output from previous day: 06/11 0701 - 06/12 0700 In: 5949.6 [I.V.:5199.6; Blood:190; NG/GT:60; IV Piggyback:500] Out: 7433 [Urine:75; Emesis/NG output:100; Chest Tube:510] Intake/Output this shift: Total I/O In: 2390.6 [I.V.:1900.6; NG/GT:90; IV Piggyback:400] Out: 3178 [Urine:6; Other:3022; Chest Tube:150]  Sedated on ventilator Severe peripheral edema Breath sounds clear, extremities warm   Lab Results:  Recent Labs  08/02/2015 0646 07/08/2015 1333 07/17/2015 1530  WBC 14.9*  --  16.5*  HGB 9.5* 9.2* 9.6*  HCT 28.7* 27.0* 29.0*  PLT 36*  --  31*   BMET:  Recent Labs  07/23/2015 0250  07/24/2015 1333 07/23/2015 1530  NA 135  135  < > 136 134*  K 4.7  4.7  < > 4.5 4.5  CL 103  103  < > 103 103  CO2 22  22  --   --  22  GLUCOSE 116*  113*  < > 102* 103*  BUN 16  18  < > 20 20  CREATININE 3.03*  2.94*  < > 2.80* 2.77*  CALCIUM 7.6*  7.6*  --   --  7.5*  < > = values in this interval not  displayed.  PT/INR: No results for input(s): LABPROT, INR in the last 72 hours. ABG    Component Value Date/Time   PHART 7.447 07/21/2015 0640   HCO3 22.3 07/29/2015 0640   TCO2 24 08/06/2015 1333   ACIDBASEDEF 2.0 07/13/2015 0640   O2SAT 97.0 07/24/2015 0640   CBG (last 3)   Recent Labs  07/18/15 2123 07/11/2015 0121 07/10/2015 0255  GLUCAP 130* 124* 118*    Assessment/Plan: S/P Procedure(s) (LRB): EXPLORATION POST OPERATIVE OPEN HEART (N/A) Continue to remove fluid with CRT Place postpyloric feeding tube and start trickle tube feeds with Nepro   LOS: 13 days    Tharon Aquas Trigt III 07/18/2015

## 2015-07-20 ENCOUNTER — Inpatient Hospital Stay (HOSPITAL_COMMUNITY): Payer: Medicare Other

## 2015-07-20 DIAGNOSIS — J80 Acute respiratory distress syndrome: Secondary | ICD-10-CM

## 2015-07-20 LAB — BLOOD GAS, ARTERIAL
Acid-base deficit: 1.4 mmol/L (ref 0.0–2.0)
Bicarbonate: 22.2 mEq/L (ref 20.0–24.0)
FIO2: 0.5
MECHVT: 550 mL
O2 Saturation: 95.3 %
PEEP: 8 cmH2O
Patient temperature: 98.6
RATE: 35 resp/min
TCO2: 23.2 mmol/L (ref 0–100)
pCO2 arterial: 33.3 mmHg — ABNORMAL LOW (ref 35.0–45.0)
pH, Arterial: 7.439 (ref 7.350–7.450)
pO2, Arterial: 79.6 mmHg — ABNORMAL LOW (ref 80.0–100.0)

## 2015-07-20 LAB — POCT I-STAT, CHEM 8
BUN: 24 mg/dL — ABNORMAL HIGH (ref 6–20)
BUN: 27 mg/dL — ABNORMAL HIGH (ref 6–20)
BUN: 30 mg/dL — AB (ref 6–20)
CALCIUM ION: 0.97 mmol/L — AB (ref 1.13–1.30)
CHLORIDE: 101 mmol/L (ref 101–111)
CREATININE: 2.9 mg/dL — AB (ref 0.61–1.24)
CREATININE: 2.7 mg/dL — AB (ref 0.61–1.24)
CREATININE: 2.7 mg/dL — AB (ref 0.61–1.24)
Calcium, Ion: 1.03 mmol/L — ABNORMAL LOW (ref 1.13–1.30)
Calcium, Ion: 1.03 mmol/L — ABNORMAL LOW (ref 1.13–1.30)
Chloride: 101 mmol/L (ref 101–111)
Chloride: 101 mmol/L (ref 101–111)
GLUCOSE: 112 mg/dL — AB (ref 65–99)
Glucose, Bld: 101 mg/dL — ABNORMAL HIGH (ref 65–99)
Glucose, Bld: 102 mg/dL — ABNORMAL HIGH (ref 65–99)
HCT: 28 % — ABNORMAL LOW (ref 39.0–52.0)
HEMATOCRIT: 28 % — AB (ref 39.0–52.0)
HEMATOCRIT: 29 % — AB (ref 39.0–52.0)
HEMOGLOBIN: 9.5 g/dL — AB (ref 13.0–17.0)
HEMOGLOBIN: 9.9 g/dL — AB (ref 13.0–17.0)
Hemoglobin: 9.5 g/dL — ABNORMAL LOW (ref 13.0–17.0)
POTASSIUM: 4.4 mmol/L (ref 3.5–5.1)
POTASSIUM: 4.7 mmol/L (ref 3.5–5.1)
Potassium: 4.6 mmol/L (ref 3.5–5.1)
SODIUM: 136 mmol/L (ref 135–145)
SODIUM: 137 mmol/L (ref 135–145)
Sodium: 135 mmol/L (ref 135–145)
TCO2: 23 mmol/L (ref 0–100)
TCO2: 22 mmol/L (ref 0–100)
TCO2: 23 mmol/L (ref 0–100)

## 2015-07-20 LAB — RENAL FUNCTION PANEL
ALBUMIN: 2.1 g/dL — AB (ref 3.5–5.0)
ALBUMIN: 2.1 g/dL — AB (ref 3.5–5.0)
ANION GAP: 10 (ref 5–15)
ANION GAP: 9 (ref 5–15)
BUN: 25 mg/dL — ABNORMAL HIGH (ref 6–20)
BUN: 31 mg/dL — ABNORMAL HIGH (ref 6–20)
CALCIUM: 7.6 mg/dL — AB (ref 8.9–10.3)
CALCIUM: 7.4 mg/dL — AB (ref 8.9–10.3)
CO2: 22 mmol/L (ref 22–32)
CO2: 22 mmol/L (ref 22–32)
Chloride: 103 mmol/L (ref 101–111)
Chloride: 103 mmol/L (ref 101–111)
Creatinine, Ser: 2.83 mg/dL — ABNORMAL HIGH (ref 0.61–1.24)
Creatinine, Ser: 2.81 mg/dL — ABNORMAL HIGH (ref 0.61–1.24)
GFR calc Af Amer: 25 mL/min — ABNORMAL LOW (ref >60)
GFR calc non Af Amer: 22 mL/min — ABNORMAL LOW (ref >60)
GFR calc non Af Amer: 22 mL/min — ABNORMAL LOW (ref >60)
GFR, EST AFRICAN AMERICAN: 25 mL/min — AB (ref >60)
GLUCOSE: 111 mg/dL — AB (ref 65–99)
Glucose, Bld: 117 mg/dL — ABNORMAL HIGH (ref 65–99)
PHOSPHORUS: 3.9 mg/dL (ref 2.5–4.6)
PHOSPHORUS: 4.1 mg/dL (ref 2.5–4.6)
Potassium: 4.6 mmol/L (ref 3.5–5.1)
Potassium: 4.5 mmol/L (ref 3.5–5.1)
SODIUM: 135 mmol/L (ref 135–145)
SODIUM: 134 mmol/L — AB (ref 135–145)

## 2015-07-20 LAB — CARBOXYHEMOGLOBIN
Carboxyhemoglobin: 1.6 % — ABNORMAL HIGH (ref 0.5–1.5)
Carboxyhemoglobin: 1.7 % — ABNORMAL HIGH (ref 0.5–1.5)
Methemoglobin: 1.2 % (ref 0.0–1.5)
Methemoglobin: 0.9 % (ref 0.0–1.5)
O2 Saturation: 52.6 %
O2 Saturation: 77.3 %
Total hemoglobin: 9.8 g/dL — ABNORMAL LOW (ref 13.5–18.0)
Total hemoglobin: 9.6 g/dL — ABNORMAL LOW (ref 13.5–18.0)

## 2015-07-20 LAB — GLUCOSE, CAPILLARY
GLUCOSE-CAPILLARY: 113 mg/dL — AB (ref 65–99)
GLUCOSE-CAPILLARY: 113 mg/dL — AB (ref 65–99)
GLUCOSE-CAPILLARY: 111 mg/dL — AB (ref 65–99)
GLUCOSE-CAPILLARY: 113 mg/dL — AB (ref 65–99)
GLUCOSE-CAPILLARY: 109 mg/dL — AB (ref 65–99)
GLUCOSE-CAPILLARY: 104 mg/dL — AB (ref 65–99)
GLUCOSE-CAPILLARY: 103 mg/dL — AB (ref 65–99)
GLUCOSE-CAPILLARY: 111 mg/dL — AB (ref 65–99)
GLUCOSE-CAPILLARY: 105 mg/dL — AB (ref 65–99)
GLUCOSE-CAPILLARY: 115 mg/dL — AB (ref 65–99)
GLUCOSE-CAPILLARY: 107 mg/dL — AB (ref 65–99)
Glucose-Capillary: 102 mg/dL — ABNORMAL HIGH (ref 65–99)
Glucose-Capillary: 104 mg/dL — ABNORMAL HIGH (ref 65–99)
Glucose-Capillary: 106 mg/dL — ABNORMAL HIGH (ref 65–99)
Glucose-Capillary: 110 mg/dL — ABNORMAL HIGH (ref 65–99)
Glucose-Capillary: 110 mg/dL — ABNORMAL HIGH (ref 65–99)
Glucose-Capillary: 110 mg/dL — ABNORMAL HIGH (ref 65–99)
Glucose-Capillary: 111 mg/dL — ABNORMAL HIGH (ref 65–99)
Glucose-Capillary: 112 mg/dL — ABNORMAL HIGH (ref 65–99)
Glucose-Capillary: 113 mg/dL — ABNORMAL HIGH (ref 65–99)
Glucose-Capillary: 117 mg/dL — ABNORMAL HIGH (ref 65–99)

## 2015-07-20 LAB — CBC
HCT: 28.2 % — ABNORMAL LOW (ref 39.0–52.0)
HCT: 28.2 % — ABNORMAL LOW (ref 39.0–52.0)
Hemoglobin: 9.4 g/dL — ABNORMAL LOW (ref 13.0–17.0)
Hemoglobin: 9.5 g/dL — ABNORMAL LOW (ref 13.0–17.0)
MCH: 28 pg (ref 26.0–34.0)
MCH: 28.7 pg (ref 26.0–34.0)
MCHC: 33.3 g/dL (ref 30.0–36.0)
MCHC: 33.7 g/dL (ref 30.0–36.0)
MCV: 83.9 fL (ref 78.0–100.0)
MCV: 85.2 fL (ref 78.0–100.0)
Platelets: 31 10*3/uL — ABNORMAL LOW (ref 150–400)
Platelets: 45 10*3/uL — ABNORMAL LOW (ref 150–400)
RBC: 3.36 MIL/uL — ABNORMAL LOW (ref 4.22–5.81)
RBC: 3.31 MIL/uL — ABNORMAL LOW (ref 4.22–5.81)
RDW: 16.6 % — ABNORMAL HIGH (ref 11.5–15.5)
RDW: 16.7 % — ABNORMAL HIGH (ref 11.5–15.5)
WBC: 17.5 10*3/uL — ABNORMAL HIGH (ref 4.0–10.5)
WBC: 17.6 10*3/uL — ABNORMAL HIGH (ref 4.0–10.5)

## 2015-07-20 LAB — COMPREHENSIVE METABOLIC PANEL
ALT: 2098 U/L — ABNORMAL HIGH (ref 17–63)
AST: 936 U/L — ABNORMAL HIGH (ref 15–41)
Albumin: 2.1 g/dL — ABNORMAL LOW (ref 3.5–5.0)
Alkaline Phosphatase: 127 U/L — ABNORMAL HIGH (ref 38–126)
Anion gap: 11 (ref 5–15)
BUN: 25 mg/dL — ABNORMAL HIGH (ref 6–20)
CO2: 23 mmol/L (ref 22–32)
Calcium: 7.6 mg/dL — ABNORMAL LOW (ref 8.9–10.3)
Chloride: 101 mmol/L (ref 101–111)
Creatinine, Ser: 2.73 mg/dL — ABNORMAL HIGH (ref 0.61–1.24)
GFR calc Af Amer: 26 mL/min — ABNORMAL LOW (ref >60)
GFR calc non Af Amer: 23 mL/min — ABNORMAL LOW (ref >60)
Glucose, Bld: 109 mg/dL — ABNORMAL HIGH (ref 65–99)
Potassium: 4.6 mmol/L (ref 3.5–5.1)
Sodium: 135 mmol/L (ref 135–145)
Total Bilirubin: 8.2 mg/dL — ABNORMAL HIGH (ref 0.3–1.2)
Total Protein: 5.3 g/dL — ABNORMAL LOW (ref 6.5–8.1)

## 2015-07-20 LAB — POCT I-STAT 3, ART BLOOD GAS (G3+)
Acid-base deficit: 3 mmol/L — ABNORMAL HIGH (ref 0.0–2.0)
BICARBONATE: 25 meq/L — AB (ref 20.0–24.0)
O2 SAT: 68 %
PCO2 ART: 57.4 mmHg — AB (ref 35.0–45.0)
PO2 ART: 37 mmHg — AB (ref 80.0–100.0)
Patient temperature: 34.8
TCO2: 27 mmol/L (ref 0–100)
pH, Arterial: 7.234 — ABNORMAL LOW (ref 7.350–7.450)

## 2015-07-20 LAB — VANCOMYCIN, RANDOM: Vancomycin Rm: 24 ug/mL

## 2015-07-20 LAB — PHOSPHORUS: Phosphorus: 3.9 mg/dL (ref 2.5–4.6)

## 2015-07-20 LAB — PROCALCITONIN: Procalcitonin: 34.3 ng/mL

## 2015-07-20 LAB — MAGNESIUM: Magnesium: 2.5 mg/dL — ABNORMAL HIGH (ref 1.7–2.4)

## 2015-07-20 MED ORDER — NEPRO/CARBSTEADY PO LIQD
1000.0000 mL | Freq: Three times a day (TID) | ORAL | Status: DC
  Administered 2015-07-20 – 2015-07-21 (×2): 1000 mL
  Filled 2015-07-20 (×8): qty 1000

## 2015-07-20 NOTE — Progress Notes (Signed)
PULMONARY / CRITICAL CARE MEDICINE   Name: Joseph [name] MRN: [id number] DOB: [birth year]    ADMISSION DATE:  06/23/2015 CONSULTATION DATE:  07/16/2015  REFERRING MD:  Nils Pyle  CHIEF COMPLAINT:  Post cardiothoracic surgery ventilator management  HISTORY OF PRESENT ILLNESS:   70 y/o male with ESRD and significant CAD (CABG 2001, DES 2016, EF 40-45%) was admitted on 5/29 with NSTEMI and had persistent chest pain during his hospitalization so he was taken for a redo CABG on 6/8 (SVG to diag and SVG to OM, IABP placement and CVL placement).  Post operatively he had excessive bleeding from all chest drains (pleural and mediastinal) so he was taken back to the OR on 6/9 for re-exploration.  PCCM was consulted for ventilator management.    SUBJECTIVE:  Remains critically ill, on vent, IABP, and CVVHD pulling around 147ml/hr. Pressor demand improving.  Going back to the OR for closure supposedly tomorrow.  No further events overnight.  VITAL SIGNS: BP 122/58 mmHg  Pulse 89  Temp(Src) 98.4 F (36.9 C) (Core (Comment))  Resp 35  Ht 5\' 11"  (1.803 m)  Wt 117 kg (257 lb 15 oz)  BMI 35.99 kg/m2  SpO2 96%  HEMODYNAMICS: PAP: (27-41)/(16-29) 34/26 mmHg CVP:  [8 mmHg-21 mmHg] 14 mmHg CO:  [5.5 L/min-6.3 L/min] 5.7 L/min CI:  [2.6 L/min/m2-3 L/min/m2] 2.7 L/min/m2  VENTILATOR SETTINGS: Vent Mode:  [-] PRVC FiO2 (%):  [50 %-70 %] 50 % Set Rate:  [35 bmp] 35 bmp Vt Set:  [550 mL] 550 mL PEEP:  [5 cmH20-8 cmH20] 8 cmH20 Plateau Pressure:  [27 [id number] cmH20] 28 cmH20  INTAKE / OUTPUT: I/O last 3 completed shifts: In: 7572.3 [I.V.:6732.3; NG/GT:240; IV Piggyback:600] Out: [id number] [Urine:56; Emesis/NG output:400; AD:[id number]; Chest Tube:680]  PHYSICAL EXAMINATION: General:  Critically ill on vent, CVVHD Neuro:  Heavily sedated / paralyzed on vent HEENT:  Diffuse facial edema, ETT in place Cardiovascular:  Balloon pump, median sternotomy dressing dry, atrial paced, decreased drainage out  of mediastinal and pleural tubes Lungs:  resps even non labored on vent, rales throughout, diminished bibasilar  Abdomen:  Distended, some focal superficial bruising, -bs  Musculoskeletal:  No bony abnormalities Skin:  Bruising all over extensor surfaces and belly, generalized 3++ edema  LABS:  BMET  Recent Labs Lab 08/03/2015 0250  08/06/2015 1530  07/20/15 0201 07/20/15 0400 07/20/15 0754  NA 135  135  < > 134*  < > 136 135  135 137  K 4.7  4.7  < > 4.5  < > 4.6 4.6  4.6 4.4  CL 103  103  < > 103  < > 101 101  103 101  CO2 22  22  --  22  --   --  23  22  --   BUN 16  18  < > 20  < > 24* 25*  25* 27*  CREATININE 3.03*  2.94*  < > 2.77*  < > 2.90* 2.73*  2.83* 2.70*  GLUCOSE 116*  113*  < > 103*  < > 101* 109*  111* 102*  < > = values in this interval not displayed.  Electrolytes  Recent Labs Lab 07/18/15 0326  07/29/2015 0250 07/16/2015 1530 07/20/15 0400  CALCIUM 7.6*  < > 7.6*  7.6* 7.5* 7.6*  7.6*  MG 2.2  --  2.2  --  2.5*  PHOS 4.2  < > 4.0 4.3 3.9  3.9  < > = values in this interval not displayed.  CBC  Recent Labs Lab 07/18/2015 0646  07/26/2015 1530  07/20/15 0201 07/20/15 0400 07/20/15 0754  WBC 14.9*  --  16.5*  --   --  17.5*  --   HGB 9.5*  < > 9.6*  < > 9.5* 9.4* 9.9*  HCT 28.7*  < > 29.0*  < > 28.0* 28.2* 29.0*  PLT 36*  --  31*  --   --  31*  --   < > = values in this interval not displayed.  Coag's  Recent Labs Lab 07/17/2015 1929 07/26/2015 2050 07/16/15 0053  APTT 142* 94* 52*  INR 1.60* 1.43 1.05    Sepsis Markers  Recent Labs Lab 07/14/2015 1530 07/20/15 0400  PROCALCITON 40.69 34.30    ABG  Recent Labs Lab 07/27/2015 0303 08/02/2015 0640 07/20/15 0405  PHART 7.436 7.447 7.439  PCO2ART 32.7* 31.9* 33.3*  PO2ART 102.0* 79.0* 79.6*    Liver Enzymes  Recent Labs Lab 07/18/15 0326  07/18/2015 0250 07/18/2015 1530 07/20/15 0400  AST 2641*  --  1236*  --  936*  ALT 3386*  --  2486*  --  2098*  ALKPHOS 126  --  115   --  127*  BILITOT 6.0*  --  6.2*  --  8.2*  ALBUMIN 2.4*  < > 2.2*  2.2* 2.1* 2.1*  2.1*  < > = values in this interval not displayed.  Cardiac Enzymes  Recent Labs Lab 07/14/15 2330  TROPONINI 0.15*    Glucose  Recent Labs Lab 07/20/15 0104 07/20/15 0259 07/20/15 0356 07/20/15 0457 07/20/15 0602 07/20/15 0654  GLUCAP 110* 113* 111* 113* 109* 110*    Imaging Dg Chest Port 1 View  07/20/2015  CLINICAL DATA:  Chest tube.  Endotracheal tube EXAM: PORTABLE CHEST 1 VIEW COMPARISON:  07/21/2015 FINDINGS: Endotracheal tube in good position. NG tube and feeding tube in place with the tips not visualized. Swan-Ganz catheter in the main pulmonary artery. Bilateral chest tubes in place. No pneumothorax Progression of left lower lobe airspace disease. No change in right lower lobe airspace disease. No significant pleural effusion. IMPRESSION: Support lines remain in good position.  No pneumothorax Progression of left lower lobe atelectasis/ infiltrate. No change in right lower lobe volume loss. Electronically Signed   By: Franchot Gallo M.D.   On: 07/20/2015 07:21   Dg Abd Portable 1v  07/20/2015  CLINICAL DATA:  Feeding tube placement EXAM: PORTABLE ABDOMEN - 1 VIEW COMPARISON:  July 19, 2015 FINDINGS: Feeding tube tip is in the distal stomach. There is also a nasogastric tube with the tip and side-port in the mid the distal stomach. There is a paucity of gas. There is a femoral catheter on the left with the tip in the mid pelvic region, stable. IMPRESSION: Tube positions as described. There is a paucity of bowel gas. Paucity of gas may be normal but also may be indicative of a degree of enteritis or early ileus. Electronically Signed   By: Lowella Grip III M.D.   On: 07/20/2015 09:17   Dg Abd Portable 1v  08/06/2015  CLINICAL DATA:  Encounter for feeding tube placement. EXAM: PORTABLE ABDOMEN - 1 VIEW COMPARISON:  07/17/2015 and chest radiograph 07/19/2015 FINDINGS: Single view of the  abdomen was obtained. There is feeding tube or nasogastric tube coiled in the stomach and the tip is near the gastric fundus. Patient has chest drains which are partially visualized. There is probably a left femoral central venous catheter. There is gas in the stomach. Otherwise,  there is paucity of bowel gas in the visualized abdomen. IMPRESSION: Feeding tube in the stomach as described. Electronically Signed   By: Markus Daft M.D.   On: 08/05/2015 16:47     STUDIES:  6/8 TEE >  CULTURES: Sputum 6/10 >> neg  ANTIBIOTICS: 6/8 Vanc >> 6/10 Zosyn >>  SIGNIFICANT EVENTS: 6/8 Redo CABG 6/8 take back to OR emergently for diffuse bleeding  LINES/TUBES: Left forearm fistula 6/8 R IJ introducer >> 6/8 Multiple chest tubes (mediastinal and pleural bilateral) >> 6/8 CVC left femoral vein >> 6/8 R radial arterial line >> 6/8 L IJ Gordy Councilman >>  DISCUSSION: 70 y/o male s/p redo CABG 6/8 complicated by significant post operative bleeding in the setting of thrombocytopenia brought back to the Surgical ICU on 6/9 with hypoxemic respiratory failure.  He has baseline ESRD and DM2.  ASSESSMENT / PLAN:  PULMONARY A: Acute respiratory failure with hypoxemia> Pulm edema, at risk for TRALI or TACO Post operative pleural bleeding Respiratory acidosis - suspect mostly V/Q mismatch from cardiogenic shock, improved Small R Pleural Effusion P:   Volume removal as able per CVVHD, -100 ml/hr. Wean FiO2 for PaO2 >80 F/u ABG in am  Continue current vent settings. Continue nimbex for now with open sternum, severe ARDS  Intermittent CXR Chest tubes per thoracic  CARDIOVASCULAR A:  Severe Cardiogenic and hemorrhagic shock CAD s/p redo CABG P:  Vasopressors epi down to 15, norepi down to 5 and vaso down to 0.02. IABP per cardiology/TCTS Pacing per TCTS Tele monitoring   RENAL A:   ESRD P:   Continue CVVHD per renal -- tolerating volume removal on pressors Renal following Replace electrolytes  as indicated.  GASTROINTESTINAL A:   No acute issues P:   OG tube H2 blocker for stress ulcer prophylaxis  HEMATOLOGIC A:   Diffuse oozing due to coagulopathy from blood loss, thrombocytopenia P:  Transfuse per TCTS guidelines  Trend CBC, coags   INFECTIOUS A:   ?HCAP - doubt, respiratory culture negative P:   Empiric abx vanc/zosyn Monitor fever curve / WBC D6/x abx, will continue for now.  ENDOCRINE A:   DM2   P:   Monitor glucose Insulin gtt per TCTS post op protocol  NEUROLOGIC A:   Sedation needs for vent synchrony P:   RASS goal: -5 Fentanyl gtt  Versed gtt Nimbex > all sedation and paralytic per protocol (BIS monitoring, train of four)  FAMILY  - Updates:  No family at bedside 6/13..   - Inter-disciplinary family meet or Palliative Care meeting due by:  day 7  The patient is critically ill with multiple organ systems failure and requires high complexity decision making for assessment and support, frequent evaluation and titration of therapies, application of advanced monitoring technologies and extensive interpretation of multiple databases.   Critical Care Time devoted to patient care services described in this note is  35  Minutes. This time reflects time of care of this signee Dr [name]. This critical care time does not reflect procedure time, or teaching time or supervisory time of PA/NP/Med student/Med Resident etc but could involve care discussion time.  Rush Farmer, M.D. [facility] Pulmonary/Critical Care Medicine. Pager: [phone]. After hours pager: [phone].

## 2015-07-20 NOTE — Progress Notes (Signed)
Patient ID: Joseph [name], male   DOB: 11-05-1945, 70 y.o.   MRN: [id number] EVENING ROUNDS NOTE :     [address]       [place] [address]             [phone]                 5 Days Post-Op Procedure(s) (LRB): EXPLORATION POST OPERATIVE OPEN HEART (N/A)  Total Length of Stay:  LOS: 14 days  BP 122/58 mmHg  Pulse 89  Temp(Src) 98.4 F (36.9 C) (Core (Comment))  Resp 35  Ht 5\' 11"  (1.803 m)  Wt 257 lb 15 oz (117 kg)  BMI 35.99 kg/m2  SpO2 97%  .Intake/Output      06/12 0701 - 06/13 0700 06/13 0701 - 06/14 0700   I.V. (mL/kg) 4102.8 (35.1) 1863 (15.9)   Blood  187   NG/GT 180 16.3   IV Piggyback 500 410   Total Intake(mL/kg) 4782.8 (40.9) 2476.3 (21.2)   Urine (mL/kg/hr) 6 (0)    Emesis/NG output 300 (0.1)    Other 7088 (2.5) 3576 (2.6)   Chest Tube 290 (0.1) 130 (0.1)   Total Output 7684 3706   Net -2901.2 -1229.7          . sodium chloride    . sodium chloride    . sodium chloride 20 mL (07/20/15 1645)  . amiodarone 30 mg/hr (07/20/15 0818)  . cisatracurium (NIMBEX) infusion 5.5 mcg/kg/min (07/20/15 1830)  . EPINEPHrine 4 mg in dextrose 5% 250 mL infusion (16 mcg/mL) 15 mcg/min (07/20/15 1307)  . feeding supplement (NEPRO CARB STEADY) 1,000 mL (07/20/15 1622)  . fentaNYL infusion INTRAVENOUS 200 mcg/hr (07/20/15 1409)  . insulin (NOVOLIN-R) infusion 5.4 Units/hr (07/20/15 1800)  . lactated ringers 20 mL/hr at 07/31/2015 2200  . lactated ringers 20 mL/hr at 07/18/2015 2200  . lidocaine Stopped (07/16/15 0800)  . midazolam (VERSED) infusion 5 mg/hr (07/20/15 0800)  . milrinone 0.125 mcg/kg/min (07/20/15 0800)  . norepinephrine (LEVOPHED) Adult infusion 8 mcg/min (07/20/15 1645)  . dialysis replacement fluid (prismasate) 400 mL/hr at 07/20/15 0846  . dialysis replacement fluid (prismasate) 200 mL/hr at 07/08/2015 0729  . dialysate (PRISMASATE) 1,500 mL/hr at 07/20/15 1831  . vasopressin (PITRESSIN) infusion - *FOR SHOCK* Stopped (07/20/15 1700)       Lab Results  Component Value Date   WBC 17.6* 07/20/2015   HGB 9.5* 07/20/2015   HCT 28.2* 07/20/2015   PLT 45* 07/20/2015   GLUCOSE 117* 07/20/2015   CHOL 88 07/06/2015   TRIG 184* 07/06/2015   HDL 26* 07/06/2015   LDLCALC 25 07/06/2015   ALT 2098* 07/20/2015   AST 936* 07/20/2015   NA 134* 07/20/2015   K 4.5 07/20/2015   CL 103 07/20/2015   CREATININE 2.81* 07/20/2015   BUN 31* 07/20/2015   CO2 22 07/20/2015   TSH 2.026 11/04/2014   PSA <0.1 02/20/2013   INR 1.05 07/16/2015   HGBA1C 6.9* 07/04/2015   Now off vasopressin, Tolerating pull 150 ml per hour  plts given today    Grace Isaac MD  Beeper [phone] Office [phone] 07/20/2015 6:49 PM

## 2015-07-20 NOTE — Progress Notes (Signed)
[facility] ROUNDING NOTE   Subjective:   Interval History: no changes today  Objective:  Vital signs in last 24 hours:  Temp:  [94.3 F (34.6 C)-99.1 F (37.3 C)] 98.4 F (36.9 C) (06/13 0845) Pulse Rate:  [38-97] 89 (06/13 0845) Resp:  [0-35] 35 (06/13 0845) BP: (111-149)/(49-60) 122/58 mmHg (06/13 0321) SpO2:  [87 %-100 %] 96 % (06/13 0845) Arterial Line BP: (85-225)/(46-220) 110/55 mmHg (06/13 0845) FiO2 (%):  [50 %-70 %] 50 % (06/13 0721) Weight:  [117 kg (257 lb 15 oz)] 117 kg (257 lb 15 oz) (06/13 0500)  Weight change: -2.5 kg (-5 lb 8.2 oz) Filed Weights   07/18/15 0600 07/14/2015 0630 07/20/15 0500  Weight: 122.6 kg (270 lb 4.5 oz) 119.5 kg (263 lb 7.2 oz) 117 kg (257 lb 15 oz)    Intake/Output: I/O last 3 completed shifts: In: 7572.3 [I.V.:6732.3; NG/GT:240; IV Piggyback:600] Out: 73419 [Urine:56; Emesis/NG output:400; FXTKW:40973; Chest Tube:680]   Intake/Output this shift:  Total I/O In: 534.3 [I.V.:504.3; Blood:30] Out: 826 [Other:806; Chest Tube:20]  Gen: intubated, sedated CVS: RRR  Resp: Decreased BS bases Abd: No BS, + distention + chest tubes Ext: + edema, Lt forearm AVF + bruit NEURO: Sedated Aortic balloon pump Lt fem HD cath   Basic Metabolic Panel:  Recent Labs Lab 07/16/15 1507  07/17/15 0301  07/18/15 0326  07/18/15 1546  07/20/2015 0250  07/24/2015 1530 07/18/2015 1814 07/20/15 0201 07/20/15 0400 07/20/15 0754  NA 142  < > 141  < > 138  < > 134*  < > 135  135  < > 134* 137 136 135  135 137  K 4.9  < > 4.1  < > 4.0  < > 5.3*  < > 4.7  4.7  < > 4.5 4.5 4.6 4.6  4.6 4.4  CL 111  < > 106  < > 104  < > 103  < > 103  103  < > 103 101 101 101  103 101  CO2 19*  --  19*  < > 24  --  23  --  22  22  --  22  --   --  23  22  --   GLUCOSE 116*  < > 163*  < > 103*  < > 116*  < > 116*  113*  < > 103* 109* 101* 109*  111* 102*  BUN 22*  < > 26*  < > 20  < > 17  < > 16  18  < > 20 21* 24* 25*  25* 27*  CREATININE  3.77*  3.72*  < > 4.75*  < > 3.64*  < > 3.09*  < > 3.03*  2.94*  < > 2.77* 2.70* 2.90* 2.73*  2.83* 2.70*  CALCIUM 8.2*  --  8.2*  < > 7.6*  --  7.6*  --  7.6*  7.6*  --  7.5*  --   --  7.6*  7.6*  --   MG 1.8  --  1.8  --  2.2  --   --   --  2.2  --   --   --   --  2.5*  --   PHOS 6.4*  --  5.0*  < > 4.2  --  4.5  --  4.0  --  4.3  --   --  3.9  3.9  --   < > = values in this interval not displayed.  Liver Function Tests:  Recent Labs Lab 07/16/15 2140 07/17/15 0301  07/18/15 0326 07/18/15 1546 07/14/2015 0250 07/13/2015 1530 07/20/15 0400  AST 3011* 3032*  --  2641*  --  1236*  --  936*  ALT 2983* 3212*  --  3386*  --  2486*  --  2098*  ALKPHOS 113 123  --  126  --  115  --  127*  BILITOT 3.3* 3.4*  --  6.0*  --  6.2*  --  8.2*  PROT 4.4* 4.6*  --  4.7*  --  5.1*  --  5.3*  ALBUMIN 2.5* 2.4*  < > 2.4* 2.3* 2.2*  2.2* 2.1* 2.1*  2.1*  < > = values in this interval not displayed. No results for input(s): LIPASE, AMYLASE in the last 168 hours. No results for input(s): AMMONIA in the last 168 hours.  CBC:  Recent Labs Lab 07/18/15 1930  07/31/2015 0250  07/21/2015 0646  07/29/2015 1530 07/17/2015 1814 07/20/15 0201 07/20/15 0400 07/20/15 0754  WBC 14.3*  --  15.6*  --  14.9*  --  16.5*  --   --  17.5*  --   HGB 9.5*  < > 9.4*  < > 9.5*  < > 9.6* 10.2* 9.5* 9.4* 9.9*  HCT 28.7*  < > 28.3*  < > 28.7*  < > 29.0* 30.0* 28.0* 28.2* 29.0*  MCV 86.2  --  84.7  --  84.9  --  85.8  --   --  83.9  --   PLT 44*  --  40*  --  36*  --  31*  --   --  31*  --   < > = values in this interval not displayed.  Cardiac Enzymes:  Recent Labs Lab 07/14/15 2330  TROPONINI 0.15*    BNP: Invalid input(s): POCBNP  CBG:  Recent Labs Lab 07/20/15 0259 07/20/15 0356 07/20/15 0457 07/20/15 0602 07/20/15 0654  GLUCAP 113* 111* 113* 109* 110*    Microbiology: Results for orders placed or performed during the hospital encounter of 06/17/2015  MRSA PCR Screening     Status: None    Collection Time: 07/01/2015  8:50 PM  Result Value Ref Range Status   MRSA by PCR NEGATIVE NEGATIVE Final    Comment:        The GeneXpert MRSA Assay (FDA approved for NASAL specimens only), is one component of a comprehensive MRSA colonization surveillance program. It is not intended to diagnose MRSA infection nor to guide or monitor treatment for MRSA infections.   Surgical pcr screen     Status: None   Collection Time: 07/14/15  6:22 PM  Result Value Ref Range Status   MRSA, PCR NEGATIVE NEGATIVE Final   Staphylococcus aureus NEGATIVE NEGATIVE Final    Comment:        The Xpert SA Assay (FDA approved for NASAL specimens in patients over 74 years of age), is one component of a comprehensive surveillance program.  Test performance has been validated by [facility] for patients greater than or equal to 78 year old. It is not intended to diagnose infection nor to guide or monitor treatment.   Culture, respiratory (NON-Expectorated)     Status: None   Collection Time: 07/17/15  4:51 AM  Result Value Ref Range Status   Specimen Description TRACHEAL ASPIRATE  Final   Special Requests Normal  Final   Gram Stain   Final    RARE WBC PRESENT, PREDOMINANTLY MONONUCLEAR NO ORGANISMS SEEN    Culture  NO GROWTH 2 DAYS  Final   Report Status 07/30/2015 FINAL  Final    Coagulation Studies: No results for input(s): LABPROT, INR in the last 72 hours.  Urinalysis: No results for input(s): COLORURINE, LABSPEC, PHURINE, GLUCOSEU, HGBUR, BILIRUBINUR, KETONESUR, PROTEINUR, UROBILINOGEN, NITRITE, LEUKOCYTESUR in the last 72 hours.  Invalid input(s): APPERANCEUR    Imaging: Dg Chest Port 1 View  07/20/2015  CLINICAL DATA:  Chest tube.  Endotracheal tube EXAM: PORTABLE CHEST 1 VIEW COMPARISON:  07/31/2015 FINDINGS: Endotracheal tube in good position. NG tube and feeding tube in place with the tips not visualized. Swan-Ganz catheter in the main pulmonary artery. Bilateral chest tubes in  place. No pneumothorax Progression of left lower lobe airspace disease. No change in right lower lobe airspace disease. No significant pleural effusion. IMPRESSION: Support lines remain in good position.  No pneumothorax Progression of left lower lobe atelectasis/ infiltrate. No change in right lower lobe volume loss. Electronically Signed   By: Franchot Gallo M.D.   On: 07/20/2015 07:21   Dg Chest Port 1 View  08/03/2015  CLINICAL DATA:  Intubation. EXAM: PORTABLE CHEST 1 VIEW COMPARISON:  07/18/2015. FINDINGS: Endotracheal tube, NG tube, Swan-Ganz catheter, bilateral chest tubes, mediastinal drainage catheter, aortic balloon pump in stable position. Stable cardiomegaly. Persistent bibasilar atelectasis and/or infiltrates. Small right pleural pleural effusion cannot be excluded. No pneumothorax. Surgical staples noted over the mid and right chest. IMPRESSION: 1. Lines and tubes in stable position. 2. Prior CABG. Stable cardiomegaly. No pulmonary venous congestion. 3. Low lung volumes with bibasilar atelectasis. Small right pleural effusion cannot be excluded. Electronically Signed   By: Marcello Moores  Register   On: 07/22/2015 07:33   Dg Abd Portable 1v  07/20/2015  CLINICAL DATA:  Feeding tube placement EXAM: PORTABLE ABDOMEN - 1 VIEW COMPARISON:  July 19, 2015 FINDINGS: Feeding tube tip is in the distal stomach. There is also a nasogastric tube with the tip and side-port in the mid the distal stomach. There is a paucity of gas. There is a femoral catheter on the left with the tip in the mid pelvic region, stable. IMPRESSION: Tube positions as described. There is a paucity of bowel gas. Paucity of gas may be normal but also may be indicative of a degree of enteritis or early ileus. Electronically Signed   By: Lowella Grip III M.D.   On: 07/20/2015 09:17   Dg Abd Portable 1v  07/16/2015  CLINICAL DATA:  Encounter for feeding tube placement. EXAM: PORTABLE ABDOMEN - 1 VIEW COMPARISON:  07/17/2015 and chest  radiograph 07/31/2015 FINDINGS: Single view of the abdomen was obtained. There is feeding tube or nasogastric tube coiled in the stomach and the tip is near the gastric fundus. Patient has chest drains which are partially visualized. There is probably a left femoral central venous catheter. There is gas in the stomach. Otherwise, there is paucity of bowel gas in the visualized abdomen. IMPRESSION: Feeding tube in the stomach as described. Electronically Signed   By: Markus Daft M.D.   On: 08/01/2015 16:47     Medications:   . sodium chloride    . sodium chloride    . sodium chloride 20 mL/hr at 07/29/2015 1800  . amiodarone 30 mg/hr (07/20/15 0818)  . cisatracurium (NIMBEX) infusion 5.2 mcg/kg/min (07/20/15 1000)  . EPINEPHrine 4 mg in dextrose 5% 250 mL infusion (16 mcg/mL) 15 mcg/min (07/20/15 0800)  . feeding supplement (NEPRO CARB STEADY)    . fentaNYL infusion INTRAVENOUS 200 mcg/hr (07/13/2015 2344)  .  insulin (NOVOLIN-R) infusion 4.4 Units/hr (07/20/15 1000)  . lactated ringers 20 mL/hr at 08/06/2015 2200  . lactated ringers 20 mL/hr at 07/09/2015 2200  . lidocaine Stopped (07/16/15 0800)  . midazolam (VERSED) infusion 5 mg/hr (07/20/15 0800)  . milrinone 0.125 mcg/kg/min (07/20/15 0800)  . norepinephrine (LEVOPHED) Adult infusion 5 mcg/min (07/20/15 1000)  . dialysis replacement fluid (prismasate) 400 mL/hr at 07/20/15 0846  . dialysis replacement fluid (prismasate) 200 mL/hr at 07/23/2015 0729  . dialysate (PRISMASATE) 1,500 mL/hr at 07/20/15 0742  . vasopressin (PITRESSIN) infusion - *FOR SHOCK* 0.02 Units/min (07/20/15 0815)   . sodium chloride   Intravenous Once  . sodium chloride   Intravenous Once  . amiodarone  150 mg Intravenous Once  . antiseptic oral rinse  7 mL Mouth Rinse 10 times per day  . artificial tears  1 application Both Eyes W9Q  . bisacodyl  10 mg Oral Daily   Or  . bisacodyl  10 mg Rectal Daily  . calcium acetate  1,334 mg Oral TID WC  . chlorhexidine gluconate  (SAGE KIT)  15 mL Mouth Rinse BID  . cisatracurium  0.05 mg/kg Intravenous Once  . diatrizoate meglumine-sodium  30 mL Oral Once  . docusate sodium  200 mg Oral Daily  . famotidine (PEPCID) IV  20 mg Intravenous Q24H  . feeding supplement (NEPRO CARB STEADY)  1,000 mL Per Tube TID WC  . feeding supplement (PRO-STAT SUGAR FREE 64)  60 mL Oral QID  . fentaNYL (SUBLIMAZE) injection  100 mcg Intravenous Once  . insulin regular  0-10 Units Intravenous TID WC  . magic mouthwash  10 mL Oral TID  . magnesium sulfate  4 g Intravenous Once  . midazolam  2 mg Intravenous Once  . multivitamin  1 tablet Oral QHS  . pantoprazole  40 mg Oral Daily  . piperacillin-tazobactam (ZOSYN)  IV  3.375 g Intravenous Q6H  . sodium chloride flush  3 mL Intravenous Q12H  . vancomycin  1,250 mg Intravenous Q24H   sodium chloride, anticoagulant sodium citrate, fentaNYL, fentaNYL (SUBLIMAZE) injection, heparin, lactated ringers, metoprolol, midazolam, midazolam, midazolam, ondansetron (ZOFRAN) IV, sodium chloride, sodium chloride flush, traMADol  Assessment/ Plan:  Redo CABG 2 with preoperative hemodialysis dependent renal failure and chronic thrombocytopenia-. Massive transfusion requirements for severe peri Operative coagulopathy. Now with significant fluid retention, vasodilatation with high cardiac output and CRT therapy to remove volume as tolerated by blood pressure 1. SP CABG stable weaning pressors  2. DM Controlled by primary team  3. Anemia Controlled Hb 9.9 4. HTN / volume will try to remove 100- 150 cc per hour  5. ESRD TTS Usually now on CRRT due help with volume removal in setting of pressors 6. Thrombocytopenia Appreciate help from Dr Beryle Beams 7. Continues on Vanc and Zosyn    LOS: 24 Yousuf Ager W '@[email or web address]''@11'$ :15 AM

## 2015-07-20 NOTE — Care Management Important Message (Signed)
Important Message  Patient Details  Name: Joseph [name] MRN: [id number] Date of Birth: Jul 02, 1945   Medicare Important Message Given:  Yes    [name] 07/20/2015, 8:38 AM

## 2015-07-21 ENCOUNTER — Inpatient Hospital Stay (HOSPITAL_COMMUNITY): Payer: Medicare Other

## 2015-07-21 DIAGNOSIS — J810 Acute pulmonary edema: Secondary | ICD-10-CM

## 2015-07-21 DIAGNOSIS — I255 Ischemic cardiomyopathy: Secondary | ICD-10-CM

## 2015-07-21 DIAGNOSIS — J9601 Acute respiratory failure with hypoxia: Secondary | ICD-10-CM

## 2015-07-21 LAB — RENAL FUNCTION PANEL
Albumin: 1.9 g/dL — ABNORMAL LOW (ref 3.5–5.0)
Anion gap: 10 (ref 5–15)
BUN: 47 mg/dL — ABNORMAL HIGH (ref 6–20)
CO2: 22 mmol/L (ref 22–32)
Calcium: 8 mg/dL — ABNORMAL LOW (ref 8.9–10.3)
Chloride: 101 mmol/L (ref 101–111)
Creatinine, Ser: 2.95 mg/dL — ABNORMAL HIGH (ref 0.61–1.24)
GFR calc Af Amer: 24 mL/min — ABNORMAL LOW (ref >60)
GFR calc non Af Amer: 21 mL/min — ABNORMAL LOW (ref >60)
Glucose, Bld: 169 mg/dL — ABNORMAL HIGH (ref 65–99)
Phosphorus: 4.4 mg/dL (ref 2.5–4.6)
Potassium: 4.3 mmol/L (ref 3.5–5.1)
Sodium: 133 mmol/L — ABNORMAL LOW (ref 135–145)

## 2015-07-21 LAB — POCT I-STAT, CHEM 8
BUN: 35 mg/dL — AB (ref 6–20)
BUN: 35 mg/dL — AB (ref 6–20)
BUN: 37 mg/dL — AB (ref 6–20)
BUN: 43 mg/dL — AB (ref 6–20)
BUN: 35 mg/dL — AB (ref 6–20)
BUN: 43 mg/dL — AB (ref 6–20)
CALCIUM ION: 1 mmol/L — AB (ref 1.13–1.30)
CHLORIDE: 104 mmol/L (ref 101–111)
CHLORIDE: 101 mmol/L (ref 101–111)
CREATININE: 2.8 mg/dL — AB (ref 0.61–1.24)
CREATININE: 2.8 mg/dL — AB (ref 0.61–1.24)
CREATININE: 2.1 mg/dL — AB (ref 0.61–1.24)
Calcium, Ion: 1.04 mmol/L — ABNORMAL LOW (ref 1.13–1.30)
Calcium, Ion: 0.66 mmol/L — ABNORMAL LOW (ref 1.13–1.30)
Calcium, Ion: 1.04 mmol/L — ABNORMAL LOW (ref 1.13–1.30)
Calcium, Ion: 0.62 mmol/L — CL (ref 1.13–1.30)
Calcium, Ion: 1.07 mmol/L — ABNORMAL LOW (ref 1.13–1.30)
Chloride: 101 mmol/L (ref 101–111)
Chloride: 96 mmol/L — ABNORMAL LOW (ref 101–111)
Chloride: 96 mmol/L — ABNORMAL LOW (ref 101–111)
Chloride: 99 mmol/L — ABNORMAL LOW (ref 101–111)
Creatinine, Ser: 2.3 mg/dL — ABNORMAL HIGH (ref 0.61–1.24)
Creatinine, Ser: 3 mg/dL — ABNORMAL HIGH (ref 0.61–1.24)
Creatinine, Ser: 3.1 mg/dL — ABNORMAL HIGH (ref 0.61–1.24)
GLUCOSE: 116 mg/dL — AB (ref 65–99)
GLUCOSE: 121 mg/dL — AB (ref 65–99)
GLUCOSE: 165 mg/dL — AB (ref 65–99)
GLUCOSE: 191 mg/dL — AB (ref 65–99)
Glucose, Bld: 166 mg/dL — ABNORMAL HIGH (ref 65–99)
Glucose, Bld: 189 mg/dL — ABNORMAL HIGH (ref 65–99)
HCT: 32 % — ABNORMAL LOW (ref 39.0–52.0)
HCT: 34 % — ABNORMAL LOW (ref 39.0–52.0)
HEMATOCRIT: 27 % — AB (ref 39.0–52.0)
HEMATOCRIT: 29 % — AB (ref 39.0–52.0)
HEMATOCRIT: 34 % — AB (ref 39.0–52.0)
HEMATOCRIT: 29 % — AB (ref 39.0–52.0)
HEMOGLOBIN: 10.9 g/dL — AB (ref 13.0–17.0)
HEMOGLOBIN: 11.6 g/dL — AB (ref 13.0–17.0)
HEMOGLOBIN: 9.9 g/dL — AB (ref 13.0–17.0)
Hemoglobin: 9.2 g/dL — ABNORMAL LOW (ref 13.0–17.0)
Hemoglobin: 11.6 g/dL — ABNORMAL LOW (ref 13.0–17.0)
Hemoglobin: 9.9 g/dL — ABNORMAL LOW (ref 13.0–17.0)
POTASSIUM: 4.2 mmol/L (ref 3.5–5.1)
POTASSIUM: 4.1 mmol/L (ref 3.5–5.1)
POTASSIUM: 4.2 mmol/L (ref 3.5–5.1)
POTASSIUM: 4.3 mmol/L (ref 3.5–5.1)
Potassium: 4.1 mmol/L (ref 3.5–5.1)
Potassium: 4.2 mmol/L (ref 3.5–5.1)
SODIUM: 136 mmol/L (ref 135–145)
SODIUM: 136 mmol/L (ref 135–145)
Sodium: 137 mmol/L (ref 135–145)
Sodium: 138 mmol/L (ref 135–145)
Sodium: 138 mmol/L (ref 135–145)
Sodium: 138 mmol/L (ref 135–145)
TCO2: 23 mmol/L (ref 0–100)
TCO2: 22 mmol/L (ref 0–100)
TCO2: 23 mmol/L (ref 0–100)
TCO2: 25 mmol/L (ref 0–100)
TCO2: 24 mmol/L (ref 0–100)
TCO2: 26 mmol/L (ref 0–100)

## 2015-07-21 LAB — CARBOXYHEMOGLOBIN
Carboxyhemoglobin: 1.1 % (ref 0.5–1.5)
Carboxyhemoglobin: 1.2 % (ref 0.5–1.5)
Methemoglobin: 1.2 % (ref 0.0–1.5)
Methemoglobin: 1.1 % (ref 0.0–1.5)
O2 Saturation: 54.8 %
O2 Saturation: 56.6 %
Total hemoglobin: 10.7 g/dL — ABNORMAL LOW (ref 13.5–18.0)
Total hemoglobin: 9.9 g/dL — ABNORMAL LOW (ref 13.5–18.0)

## 2015-07-21 LAB — POCT I-STAT 3, ART BLOOD GAS (G3+)
Bicarbonate: 23.6 mEq/L (ref 20.0–24.0)
O2 Saturation: 98 %
PH ART: 7.438 (ref 7.350–7.450)
PO2 ART: 98 mmHg (ref 80.0–100.0)
TCO2: 25 mmol/L (ref 0–100)
pCO2 arterial: 34.7 mmHg — ABNORMAL LOW (ref 35.0–45.0)

## 2015-07-21 LAB — BLOOD GAS, ARTERIAL
Acid-base deficit: 0.6 mmol/L (ref 0.0–2.0)
Bicarbonate: 23.1 mEq/L (ref 20.0–24.0)
FIO2: 0.5
MECHVT: 550 mL
O2 Saturation: 96.4 %
PEEP: 8 cmH2O
Patient temperature: 98.6
RATE: 35 resp/min
TCO2: 24.2 mmol/L (ref 0–100)
pCO2 arterial: 35.6 mmHg (ref 35.0–45.0)
pH, Arterial: 7.429 (ref 7.350–7.450)
pO2, Arterial: 89.8 mmHg (ref 80.0–100.0)

## 2015-07-21 LAB — COMPREHENSIVE METABOLIC PANEL
ALT: 1509 U/L — ABNORMAL HIGH (ref 17–63)
AST: 419 U/L — ABNORMAL HIGH (ref 15–41)
Albumin: 2 g/dL — ABNORMAL LOW (ref 3.5–5.0)
Alkaline Phosphatase: 141 U/L — ABNORMAL HIGH (ref 38–126)
Anion gap: 9 (ref 5–15)
BUN: 38 mg/dL — ABNORMAL HIGH (ref 6–20)
CO2: 22 mmol/L (ref 22–32)
Calcium: 7.4 mg/dL — ABNORMAL LOW (ref 8.9–10.3)
Chloride: 103 mmol/L (ref 101–111)
Creatinine, Ser: 2.75 mg/dL — ABNORMAL HIGH (ref 0.61–1.24)
GFR calc Af Amer: 26 mL/min — ABNORMAL LOW (ref >60)
GFR calc non Af Amer: 22 mL/min — ABNORMAL LOW (ref >60)
Glucose, Bld: 128 mg/dL — ABNORMAL HIGH (ref 65–99)
Potassium: 4.5 mmol/L (ref 3.5–5.1)
Sodium: 134 mmol/L — ABNORMAL LOW (ref 135–145)
Total Bilirubin: 13.2 mg/dL — ABNORMAL HIGH (ref 0.3–1.2)
Total Protein: 5.2 g/dL — ABNORMAL LOW (ref 6.5–8.1)

## 2015-07-21 LAB — PREPARE PLATELET PHERESIS: Unit division: 0

## 2015-07-21 LAB — PROCALCITONIN: Procalcitonin: 24.9 ng/mL

## 2015-07-21 LAB — GLUCOSE, CAPILLARY
GLUCOSE-CAPILLARY: 118 mg/dL — AB (ref 65–99)
GLUCOSE-CAPILLARY: 126 mg/dL — AB (ref 65–99)
GLUCOSE-CAPILLARY: 126 mg/dL — AB (ref 65–99)
GLUCOSE-CAPILLARY: 117 mg/dL — AB (ref 65–99)
GLUCOSE-CAPILLARY: 92 mg/dL (ref 65–99)
GLUCOSE-CAPILLARY: 122 mg/dL — AB (ref 65–99)
GLUCOSE-CAPILLARY: 138 mg/dL — AB (ref 65–99)
GLUCOSE-CAPILLARY: 129 mg/dL — AB (ref 65–99)
GLUCOSE-CAPILLARY: 130 mg/dL — AB (ref 65–99)
GLUCOSE-CAPILLARY: 123 mg/dL — AB (ref 65–99)
GLUCOSE-CAPILLARY: 102 mg/dL — AB (ref 65–99)
GLUCOSE-CAPILLARY: 165 mg/dL — AB (ref 65–99)
Glucose-Capillary: 114 mg/dL — ABNORMAL HIGH (ref 65–99)
Glucose-Capillary: 118 mg/dL — ABNORMAL HIGH (ref 65–99)
Glucose-Capillary: 119 mg/dL — ABNORMAL HIGH (ref 65–99)
Glucose-Capillary: 122 mg/dL — ABNORMAL HIGH (ref 65–99)
Glucose-Capillary: 126 mg/dL — ABNORMAL HIGH (ref 65–99)
Glucose-Capillary: 128 mg/dL — ABNORMAL HIGH (ref 65–99)
Glucose-Capillary: 129 mg/dL — ABNORMAL HIGH (ref 65–99)
Glucose-Capillary: 130 mg/dL — ABNORMAL HIGH (ref 65–99)
Glucose-Capillary: 160 mg/dL — ABNORMAL HIGH (ref 65–99)
Glucose-Capillary: 161 mg/dL — ABNORMAL HIGH (ref 65–99)

## 2015-07-21 LAB — CBC
HCT: 27.7 % — ABNORMAL LOW (ref 39.0–52.0)
HCT: 27.5 % — ABNORMAL LOW (ref 39.0–52.0)
Hemoglobin: 9.4 g/dL — ABNORMAL LOW (ref 13.0–17.0)
Hemoglobin: 9.3 g/dL — ABNORMAL LOW (ref 13.0–17.0)
MCH: 28.2 pg (ref 26.0–34.0)
MCH: 28.4 pg (ref 26.0–34.0)
MCHC: 33.9 g/dL (ref 30.0–36.0)
MCHC: 33.8 g/dL (ref 30.0–36.0)
MCV: 83.2 fL (ref 78.0–100.0)
MCV: 84.1 fL (ref 78.0–100.0)
Platelets: 48 10*3/uL — ABNORMAL LOW (ref 150–400)
Platelets: 51 10*3/uL — ABNORMAL LOW (ref 150–400)
RBC: 3.33 MIL/uL — ABNORMAL LOW (ref 4.22–5.81)
RBC: 3.27 MIL/uL — ABNORMAL LOW (ref 4.22–5.81)
RDW: 16.6 % — ABNORMAL HIGH (ref 11.5–15.5)
RDW: 16.5 % — ABNORMAL HIGH (ref 11.5–15.5)
WBC: 17.9 10*3/uL — ABNORMAL HIGH (ref 4.0–10.5)
WBC: 18.2 10*3/uL — ABNORMAL HIGH (ref 4.0–10.5)

## 2015-07-21 LAB — VANCOMYCIN, TROUGH: Vancomycin Tr: 25 ug/mL — ABNORMAL HIGH (ref 10.0–20.0)

## 2015-07-21 LAB — ECHOCARDIOGRAM COMPLETE
E decel time: 169 msec
FS: 12 % — AB (ref 28–44)
Height: 71 in
IVS/LV PW RATIO, ED: 1.28
LA vol A4C: 62.7 ml
LA vol index: 24.1 mL/m2
LA vol: 55.6 mL
LV PW d: 12.3 mm — AB (ref 0.6–1.1)
LV dias vol index: 47 mL/m2
LV dias vol: 109 mL (ref 62–150)
LV sys vol index: 31 mL/m2
LV sys vol: 72 mL — AB (ref 21–61)
LVOT area: 3.14 cm2
LVOT diameter: 20 mm
MV Dec: 169
MV Peak grad: 3 mmHg
MV pk A vel: 91.4 m/s
MV pk E vel: 86.3 m/s
Simpson's disk: 34
Stroke v: 37 ml
Weight: 3971.81 oz

## 2015-07-21 LAB — MAGNESIUM: Magnesium: 2.5 mg/dL — ABNORMAL HIGH (ref 1.7–2.4)

## 2015-07-21 LAB — VANCOMYCIN, RANDOM: Vancomycin Rm: 28 ug/mL

## 2015-07-21 LAB — PHOSPHORUS: PHOSPHORUS: 4.7 mg/dL — AB (ref 2.5–4.6)

## 2015-07-21 MED ORDER — HEPARIN SODIUM (PORCINE) 1000 UNIT/ML DIALYSIS
1000.0000 [IU] | INTRAMUSCULAR | Status: DC | PRN
  Filled 2015-07-21: qty 6

## 2015-07-21 MED ORDER — ANTICOAGULANT SODIUM CITRATE 4% (200MG/5ML) IV SOLN: 5.0000 mL | Status: DC | PRN

## 2015-07-21 MED ORDER — NEPRO/CARBSTEADY PO LIQD
1000.0000 mL | ORAL | Status: DC
  Filled 2015-07-21: qty 1000

## 2015-07-21 MED ORDER — PRISMASOL BGK 4/2.5 32-4-2.5 MEQ/L IV SOLN
INTRAVENOUS | Status: DC
Start: 2015-07-21 — End: 2015-07-24
  Administered 2015-07-21 – 2015-07-24 (×14): via INTRAVENOUS_CENTRAL
  Filled 2015-07-21 (×39): qty 5000

## 2015-07-21 MED ORDER — VANCOMYCIN HCL IN DEXTROSE 1-5 GM/200ML-% IV SOLN
1000.0000 mg | INTRAVENOUS | Status: DC
  Administered 2015-07-21 – 2015-07-23 (×3): 1000 mg via INTRAVENOUS
  Filled 2015-07-21 (×3): qty 200

## 2015-07-21 MED ORDER — DEXTROSE 5 % IV SOLN
20.0000 g | INTRAVENOUS | Status: DC
  Administered 2015-07-21 – 2015-07-22 (×3): 20 g via INTRAVENOUS_CENTRAL
  Filled 2015-07-21 (×7): qty 200

## 2015-07-21 MED ORDER — PRISMASOL BGK 4/2.5 32-4-2.5 MEQ/L IV SOLN
INTRAVENOUS | Status: DC
  Administered 2015-07-22 – 2015-07-23 (×3): via INTRAVENOUS_CENTRAL
  Filled 2015-07-21 (×8): qty 5000

## 2015-07-21 MED ORDER — SODIUM CHLORIDE 0.9 % IV SOLN
1.0000 g | Freq: Once | INTRAVENOUS | Status: AC
  Administered 2015-07-21: 1 g via INTRAVENOUS
  Filled 2015-07-21: qty 10

## 2015-07-21 MED ORDER — SODIUM CHLORIDE 0.9 % FOR CRRT
INTRAVENOUS_CENTRAL | Status: DC | PRN
  Filled 2015-07-21: qty 1000

## 2015-07-21 MED ORDER — PRISMASOL BGK 4/2.5 32-4-2.5 MEQ/L IV SOLN
INTRAVENOUS | Status: DC
  Filled 2015-07-21: qty 5000

## 2015-07-21 MED ORDER — DEXTROSE 5 % IV SOLN
Status: DC
  Administered 2015-07-21 – 2015-07-23 (×5): via INTRAVENOUS_CENTRAL
  Filled 2015-07-21 (×15): qty 1500

## 2015-07-21 NOTE — Progress Notes (Signed)
PULMONARY / CRITICAL CARE MEDICINE   Name: Joseph [name] MRN: [id number] DOB: [birth year]    ADMISSION DATE:  07/02/2015 CONSULTATION DATE:  07/16/2015  REFERRING MD:  Nils Pyle  CHIEF COMPLAINT:  Post cardiothoracic surgery ventilator management  HISTORY OF PRESENT ILLNESS:   70 y/o male with ESRD and significant CAD (CABG 2001, DES 2016, EF 40-45%) was admitted on 5/29 with NSTEMI and had persistent chest pain during his hospitalization so he was taken for a redo CABG on 6/8 (SVG to diag and SVG to OM, IABP placement and CVL placement).  Post operatively he had excessive bleeding from all chest drains (pleural and mediastinal) so he was taken back to the OR on 6/9 for re-exploration.  PCCM was consulted for ventilator management.    SUBJECTIVE:  Remains critically ill, on vent, IABP, and CVVHD pulling around 112ml/hr. Pressor demand improving.  Going back to the OR for closure supposedly tomorrow.  No further events overnight.  VITAL SIGNS: BP 101/55 mmHg  Pulse 89  Temp(Src) 97.5 F (36.4 C) (Core (Comment))  Resp 35  Ht 5\' 11"  (1.803 m)  Wt 112.6 kg (248 lb 3.8 oz)  BMI 34.64 kg/m2  SpO2 98%  HEMODYNAMICS: PAP: (30-42)/(21-31) 35/25 mmHg CVP:  [10 mmHg-20 mmHg] 15 mmHg CO:  [5.8 L/min-6.9 L/min] 5.8 L/min CI:  [2.7 L/min/m2-3.2 L/min/m2] 2.7 L/min/m2  VENTILATOR SETTINGS: Vent Mode:  [-] PRVC FiO2 (%):  [50 %] 50 % Set Rate:  [35 bmp] 35 bmp Vt Set:  [550 mL] 550 mL PEEP:  [8 cmH20] 8 cmH20 Plateau Pressure:  [25 [id number] cmH20] 25 cmH20  INTAKE / OUTPUT: I/O last 3 completed shifts: In: 7404.8 [I.V.:5981.5; Blood:187; NG/GT:626.3; IV Piggyback:610] Out: [id number] [Emesis/NG output:950; JI:[id number]; Chest Tube:320]  PHYSICAL EXAMINATION: General:  Critically ill on vent, CVVHD Neuro:  Heavily sedated / paralyzed on vent HEENT:  Diffuse facial edema, ETT in place Cardiovascular:  Balloon pump, median sternotomy dressing dry, atrial paced, decreased drainage out of  mediastinal and pleural tubes Lungs:  resps even non labored on vent, rales throughout, diminished bibasilar  Abdomen:  Distended, some focal superficial bruising, -bs  Musculoskeletal:  No bony abnormalities Skin:  Bruising all over extensor surfaces and belly, generalized 3++ edema  LABS:  BMET  Recent Labs Lab 07/20/15 0400  07/20/15 1517 07/21/15 0102 07/21/15 0358 07/21/15 0802  NA 135  135  < > 134* 137 134* 138  K 4.6  4.6  < > 4.5 4.2 4.5 4.1  CL 101  103  < > 103 101 103 104  CO2 23  22  --  22  --  22  --   BUN 25*  25*  < > 31* 35* 38* 35*  CREATININE 2.73*  2.83*  < > 2.81* 2.80* 2.75* 2.80*  GLUCOSE 109*  111*  < > 117* 116* 128* 121*  < > = values in this interval not displayed.  Electrolytes  Recent Labs Lab 08/03/2015 0250  07/20/15 0400 07/20/15 1517 07/21/15 0358  CALCIUM 7.6*  7.6*  < > 7.6*  7.6* 7.4* 7.4*  MG 2.2  --  2.5*  --  2.5*  PHOS 4.0  < > 3.9  3.9 4.1 4.7*  < > = values in this interval not displayed.  CBC  Recent Labs Lab 07/20/15 0400  07/20/15 1517 07/21/15 0102 07/21/15 0358 07/21/15 0802  WBC 17.5*  --  17.6*  --  17.9*  --   HGB 9.4*  < > 9.5* 10.9* 9.4*  9.2*  HCT 28.2*  < > 28.2* 32.0* 27.7* 27.0*  PLT 31*  --  45*  --  48*  --   < > = values in this interval not displayed.  Coag's  Recent Labs Lab 08/04/2015 1929 07/20/2015 2050 07/16/15 0053  APTT 142* 94* 52*  INR 1.60* 1.43 1.05    Sepsis Markers  Recent Labs Lab 07/24/2015 1530 07/20/15 0400 07/21/15 0358  PROCALCITON 40.69 34.30 24.90    ABG  Recent Labs Lab 07/23/2015 0640 07/20/15 0405 07/21/15 0415  PHART 7.447 7.439 7.429  PCO2ART 31.9* 33.3* 35.6  PO2ART 79.0* 79.6* 89.8    Liver Enzymes  Recent Labs Lab 07/19/15 0250  07/20/15 0400 07/20/15 1517 07/21/15 0358  AST 1236*  --  936*  --  419*  ALT 2486*  --  2098*  --  1509*  ALKPHOS 115  --  127*  --  141*  BILITOT 6.2*  --  8.2*  --  13.2*  ALBUMIN 2.2*  2.2*  < > 2.1*   2.1* 2.1* 2.0*  < > = values in this interval not displayed.  Cardiac Enzymes  Recent Labs Lab 07/14/15 2330  TROPONINI 0.15*    Glucose  Recent Labs Lab 07/21/15 0157 07/21/15 0256 07/21/15 0401 07/21/15 0455 07/21/15 0554 07/21/15 0651  GLUCAP 119* 92 122* 138* 130* 129*    Imaging Dg Chest Port 1 View  07/21/2015  CLINICAL DATA:  Respiratory distress. EXAM: PORTABLE CHEST 1 VIEW COMPARISON:  07/20/2015. FINDINGS: Support tubes and lines are stable. BILATERAL chest tubes without pneumothorax. Slight improvement aeration with clearing of vascular congestion. Lower lobe opacities redemonstrated. IMPRESSION: Slight improvement aeration, some clearing of vascular congestion. Electronically Signed   By: Staci Righter M.D.   On: 07/21/2015 07:51   Dg Abd Portable 1v  07/21/2015  CLINICAL DATA:  Ileus EXAM: PORTABLE ABDOMEN - 1 VIEW COMPARISON:  Portable exam 0707 hours compared to 07/20/2015 FINDINGS: Feeding tube coiled in stomach. Tip of nasogastric tube projects over gastric antrum. LEFT femoral line. Penile prosthesis with reservoir in RIGHT pelvis. Brachytherapy seed implants in prostate bed. Paucity of bowel gas. No obvious bowel dilatation identified. IMPRESSION: Paucity of bowel gas without gross bowel dilatation. Electronically Signed   By: Lavonia Dana M.D.   On: 07/21/2015 07:51   Dg Abd Portable 1v  07/20/2015  CLINICAL DATA:  Feeding tube placement EXAM: PORTABLE ABDOMEN - 1 VIEW COMPARISON:  07/20/2015 FINDINGS: There is paucity of bowel gas suspicious for ileus. Bilateral chest tube again noted. There is NG feeding tube with tip in distal stomach/pyloric region. IMPRESSION: NG feeding tube with tip in distal stomach/ pyloric region. Electronically Signed   By: Lahoma Crocker M.D.   On: 07/20/2015 11:42     STUDIES:  6/8 TEE >  CULTURES: Sputum 6/10 >> neg  ANTIBIOTICS: 6/8 Vanc >> 6/10 Zosyn >>  SIGNIFICANT EVENTS: 6/8 Redo CABG 6/8 take back to OR emergently for  diffuse bleeding  LINES/TUBES: Left forearm fistula 6/8 R IJ introducer >> 6/8 Multiple chest tubes (mediastinal and pleural bilateral) >> 6/8 CVC left femoral vein >> 6/8 R radial arterial line >> 6/8 L IJ Gordy Councilman >>  DISCUSSION: 70 y/o male s/p redo CABG 6/8 complicated by significant post operative bleeding in the setting of thrombocytopenia brought back to the Surgical ICU on 6/9 with hypoxemic respiratory failure.  He has baseline ESRD and DM2.  ASSESSMENT / PLAN:  PULMONARY A: Acute respiratory failure with hypoxemia> Pulm edema, at risk  for TRALI or TACO Post operative pleural bleeding Respiratory acidosis - suspect mostly V/Q mismatch from cardiogenic shock, improved Small R Pleural Effusion P:   Volume removal as able per CVVHD, -100 ml/hr. Wean FiO2 for PaO2 >80 FiO2 50% and PEEP 8, will maintain. F/u ABG in am  Continue current vent settings. Continue nimbex for now with open sternum, severe ARDS  Intermittent CXR Chest tubes per thoracic  CARDIOVASCULAR A:  Severe Cardiogenic and hemorrhagic shock CAD s/p redo CABG P:  Vasopressors epi down to 15, norepi down to 1 and vaso down to 0. IABP per cardiology/TCTS. Pacing per TCTS. Tele monitoring.  RENAL A:   ESRD P:   Continue CVVHD per renal -- tolerating volume removal on pressors Renal following Replace electrolytes as indicated.  GASTROINTESTINAL A:   No acute issues P:   OG tube TF per nutrition. H2 blocker for stress ulcer prophylaxis  HEMATOLOGIC A:   Diffuse oozing due to coagulopathy from blood loss, thrombocytopenia P:  Transfuse per TCTS guidelines. Trend CBC, coags.  INFECTIOUS A:   ?HCAP - doubt, respiratory culture negative P:   Empiric abx vanc/zosyn. Monitor fever curve / WBC. D7/x abx, will continue for now.  ENDOCRINE A:   DM2   P:   Monitor glucose Insulin gtt per TCTS post op protocol  NEUROLOGIC A:   Sedation needs for vent synchrony P:   RASS goal:  -5 Fentanyl gtt  Versed gtt CVTS is discontinuing paralytics today.  FAMILY  - Updates:  No family at bedside 6/14.   - Inter-disciplinary family meet or Palliative Care meeting due by:  day 7  The patient is critically ill with multiple organ systems failure and requires high complexity decision making for assessment and support, frequent evaluation and titration of therapies, application of advanced monitoring technologies and extensive interpretation of multiple databases.   Critical Care Time devoted to patient care services described in this note is  35  Minutes. This time reflects time of care of this signee Dr [name]. This critical care time does not reflect procedure time, or teaching time or supervisory time of PA/NP/Med student/Med Resident etc but could involve care discussion time.  Rush Farmer, M.D. [facility] Pulmonary/Critical Care Medicine. Pager: [phone]. After hours pager: [phone].

## 2015-07-21 NOTE — Progress Notes (Signed)
Pharmacy Antibiotic Note  Joseph [name] is a 70 y.o. male s/p redo CABG on 6/9 who continues on vancomycin and zosyn for open sternum. Vancomycin level drawn today is supratherapeutic at 25 (goal 15-20). He continues on CRRT. Plan is to go back to OR 6/16 for sternal closure.  Plan: 1) Decrease vancomycin to 1g IV q24 2) Continue zosyn 3.375g IV q6  Height: 5\' 11"  (180.3 cm) Weight: 248 lb 3.8 oz (112.6 kg) IBW/kg (Calculated) : 75.3  Temp (24hrs), Avg:97.8 F (36.6 C), Min:96.1 F (35.6 C), Max:98.8 F (37.1 C)   Recent Labs Lab 07/15/2015 0646  07/29/2015 1530  07/20/15 0400  07/20/15 1432 07/20/15 1517 07/21/15 0102 07/21/15 0358 07/21/15 0802 07/21/15 0920  WBC 14.9*  --  16.5*  --  17.5*  --   --  17.6*  --  17.9*  --   --   CREATININE  --   < > 2.77*  < > 2.73*  2.83*  < > 2.70* 2.81* 2.80* 2.75* 2.80*  --   VANCOTROUGH  --   --   --   --   --   --   --   --   --   --   --  25*  VANCORANDOM  --   --   --   --  24  --   --   --   --  28  --   --   < > = values in this interval not displayed.  Estimated Creatinine Clearance: 32.7 mL/min (by C-G formula based on Cr of 2.8).    Allergies  Allergen Reactions  . Sulfa Antibiotics Hives and Itching  . Morphine And Related     Makes patient feel weird    Antimicrobials this admission: 6/8 Vanc >> (Rx dosing 6/11) 6/9 Zosyn>>  Dose adjustments this admission: 6/10 AM VR (CVTS ordered): 26 (only received 2 doses, CRRT off x 10 hours when level drawn) 6/11 AM VR (CVTS): 14 (no dose 6/10) - start 1250mg  q24 6/14 VT = 25 on 1250mg  q24 with CRRT (drawn ~ 1 hour early)  Microbiology results: 6/10 TA: negative, final 6/7 surg mrsa pcr: neg  Thank you for allowing pharmacy to be a part of this patient's care.  Deboraha Sprang 07/21/2015 11:09 AM

## 2015-07-21 NOTE — Progress Notes (Signed)
CT surgery p.m. Rounds  Patient had stable day with continued volume removal with CRT Transthoracic echo shows EF 30-35 percent-baseline Tube feeds slowly advanced to 40 cc/h-Nepro Planned sternal closure a.m. Friday, June 16

## 2015-07-21 NOTE — Progress Notes (Signed)
[facility] ROUNDING NOTE   Subjective:   Interval History: dialysis system appears to be clotting   Objective:  Vital signs in last 24 hours:  Temp:  [96.1 F (35.6 C)-98.8 F (37.1 C)] 97.5 F (36.4 C) (06/14 1100) Pulse Rate:  [88-90] 89 (06/14 1100) Resp:  [0-35] 35 (06/14 1100) BP: (101-135)/(51-62) 101/55 mmHg (06/14 1100) SpO2:  [95 %-100 %] 98 % (06/14 1100) Arterial Line BP: (91-148)/(46-70) 121/59 mmHg (06/14 1045) FiO2 (%):  [50 %] 50 % (06/14 1100) Weight:  [112.6 kg (248 lb 3.8 oz)] 112.6 kg (248 lb 3.8 oz) (06/14 0500)  Weight change: -4.4 kg (-9 lb 11.2 oz) Filed Weights   08/06/2015 0630 07/20/15 0500 07/21/15 0500  Weight: 119.5 kg (263 lb 7.2 oz) 117 kg (257 lb 15 oz) 112.6 kg (248 lb 3.8 oz)    Intake/Output: I/O last 3 completed shifts: In: 7404.8 [I.V.:5981.5; Blood:187; NG/GT:626.3; IV Piggyback:610] Out: 70017 [Emesis/NG output:950; CBSWH:67591; Chest Tube:320]   Intake/Output this shift:  Total I/O In: 838.9 [I.V.:458.9; NG/GT:170; IV Piggyback:210] Out: 1262 [Other:1252; Chest Tube:10]   Gen: intubated, sedated CVS: RRR  Resp: Decreased BS bases Abd: No BS, + distention + chest tubes Ext: + edema, Lt forearm AVF + bruit NEURO: Sedated Aortic balloon pump Lt fem HD cath   Basic Metabolic Panel:  Recent Labs Lab 07/17/15 0301  07/18/15 0326  07/26/2015 0250  07/12/2015 1530  07/20/15 0400  07/20/15 1432 07/20/15 1517 07/21/15 0102 07/21/15 0358 07/21/15 0802  NA 141  < > 138  < > 135  135  < > 134*  < > 135  135  < > 135 134* 137 134* 138  K 4.1  < > 4.0  < > 4.7  4.7  < > 4.5  < > 4.6  4.6  < > 4.7 4.5 4.2 4.5 4.1  CL 106  < > 104  < > 103  103  < > 103  < > 101  103  < > 101 103 101 103 104  CO2 19*  < > 24  < > 22  22  --  22  --  23  22  --   --  22  --  22  --   GLUCOSE 163*  < > 103*  < > 116*  113*  < > 103*  < > 109*  111*  < > 112* 117* 116* 128* 121*  BUN 26*  < > 20  < > 16  18  < > 20  < >  25*  25*  < > 30* 31* 35* 38* 35*  CREATININE 4.75*  < > 3.64*  < > 3.03*  2.94*  < > 2.77*  < > 2.73*  2.83*  < > 2.70* 2.81* 2.80* 2.75* 2.80*  CALCIUM 8.2*  < > 7.6*  < > 7.6*  7.6*  --  7.5*  --  7.6*  7.6*  --   --  7.4*  --  7.4*  --   MG 1.8  --  2.2  --  2.2  --   --   --  2.5*  --   --   --   --  2.5*  --   PHOS 5.0*  < > 4.2  < > 4.0  --  4.3  --  3.9  3.9  --   --  4.1  --  4.7*  --   < > = values in this interval not displayed.  Liver Function Tests:  Recent Labs Lab 07/17/15 0301  07/18/15 0326  07/29/2015 0250 07/22/2015 1530 07/20/15 0400 07/20/15 1517 07/21/15 0358  AST 3032*  --  2641*  --  1236*  --  936*  --  419*  ALT 3212*  --  3386*  --  2486*  --  2098*  --  1509*  ALKPHOS 123  --  126  --  115  --  127*  --  141*  BILITOT 3.4*  --  6.0*  --  6.2*  --  8.2*  --  13.2*  PROT 4.6*  --  4.7*  --  5.1*  --  5.3*  --  5.2*  ALBUMIN 2.4*  < > 2.4*  < > 2.2*  2.2* 2.1* 2.1*  2.1* 2.1* 2.0*  < > = values in this interval not displayed. No results for input(s): LIPASE, AMYLASE in the last 168 hours. No results for input(s): AMMONIA in the last 168 hours.  CBC:  Recent Labs Lab 07/17/2015 0646  08/01/2015 1530  07/20/15 0400  07/20/15 1432 07/20/15 1517 07/21/15 0102 07/21/15 0358 07/21/15 0802  WBC 14.9*  --  16.5*  --  17.5*  --   --  17.6*  --  17.9*  --   HGB 9.5*  < > 9.6*  < > 9.4*  < > 9.5* 9.5* 10.9* 9.4* 9.2*  HCT 28.7*  < > 29.0*  < > 28.2*  < > 28.0* 28.2* 32.0* 27.7* 27.0*  MCV 84.9  --  85.8  --  83.9  --   --  85.2  --  83.2  --   PLT 36*  --  31*  --  31*  --   --  45*  --  48*  --   < > = values in this interval not displayed.  Cardiac Enzymes:  Recent Labs Lab 07/14/15 2330  TROPONINI 0.15*    BNP: Invalid input(s): POCBNP  CBG:  Recent Labs Lab 07/21/15 0256 07/21/15 0401 07/21/15 0455 07/21/15 0554 07/21/15 0651  GLUCAP 92 122* 138* 130* 129*    Microbiology: Results for orders placed or performed during the  hospital encounter of 06/10/2015  MRSA PCR Screening     Status: None   Collection Time: 06/08/2015  8:50 PM  Result Value Ref Range Status   MRSA by PCR NEGATIVE NEGATIVE Final    Comment:        The GeneXpert MRSA Assay (FDA approved for NASAL specimens only), is one component of a comprehensive MRSA colonization surveillance program. It is not intended to diagnose MRSA infection nor to guide or monitor treatment for MRSA infections.   Surgical pcr screen     Status: None   Collection Time: 07/14/15  6:22 PM  Result Value Ref Range Status   MRSA, PCR NEGATIVE NEGATIVE Final   Staphylococcus aureus NEGATIVE NEGATIVE Final    Comment:        The Xpert SA Assay (FDA approved for NASAL specimens in patients over 75 years of age), is one component of a comprehensive surveillance program.  Test performance has been validated by [facility] for patients greater than or equal to 19 year old. It is not intended to diagnose infection nor to guide or monitor treatment.   Culture, respiratory (NON-Expectorated)     Status: None   Collection Time: 07/17/15  4:51 AM  Result Value Ref Range Status   Specimen Description TRACHEAL ASPIRATE  Final   Special Requests Normal  Final   Gram Stain   Final    RARE WBC PRESENT, PREDOMINANTLY MONONUCLEAR NO ORGANISMS SEEN    Culture NO GROWTH 2 DAYS  Final   Report Status 07/08/2015 FINAL  Final    Coagulation Studies: No results for input(s): LABPROT, INR in the last 72 hours.  Urinalysis: No results for input(s): COLORURINE, LABSPEC, PHURINE, GLUCOSEU, HGBUR, BILIRUBINUR, KETONESUR, PROTEINUR, UROBILINOGEN, NITRITE, LEUKOCYTESUR in the last 72 hours.  Invalid input(s): APPERANCEUR    Imaging: Dg Abd 1 View  07/21/2015  CLINICAL DATA:  Feeding tube placement EXAM: ABDOMEN - 1 VIEW COMPARISON:  07/22/2015 FLUOROSCOPY TIME:  3 minutes 0 seconds Images obtained:  3 FINDINGS: Limited visualization of feeding tube and LEFT upper quadrant.  Feeding tube coiled in stomach with tip at proximal stomach. Small amount of contrast is seen within the gastric lumen. Superimposed EKG leads and tubes. IMPRESSION: Feeding tube coiled in stomach. Electronically Signed   By: Lavonia Dana M.D.   On: 07/21/2015 10:51   Dg Chest Port 1 View  07/21/2015  CLINICAL DATA:  Respiratory distress. EXAM: PORTABLE CHEST 1 VIEW COMPARISON:  07/20/2015. FINDINGS: Support tubes and lines are stable. BILATERAL chest tubes without pneumothorax. Slight improvement aeration with clearing of vascular congestion. Lower lobe opacities redemonstrated. IMPRESSION: Slight improvement aeration, some clearing of vascular congestion. Electronically Signed   By: Staci Righter M.D.   On: 07/21/2015 07:51   Dg Chest Port 1 View  07/20/2015  CLINICAL DATA:  Chest tube.  Endotracheal tube EXAM: PORTABLE CHEST 1 VIEW COMPARISON:  07/29/2015 FINDINGS: Endotracheal tube in good position. NG tube and feeding tube in place with the tips not visualized. Swan-Ganz catheter in the main pulmonary artery. Bilateral chest tubes in place. No pneumothorax Progression of left lower lobe airspace disease. No change in right lower lobe airspace disease. No significant pleural effusion. IMPRESSION: Support lines remain in good position.  No pneumothorax Progression of left lower lobe atelectasis/ infiltrate. No change in right lower lobe volume loss. Electronically Signed   By: Franchot Gallo M.D.   On: 07/20/2015 07:21   Dg Abd Portable 1v  07/21/2015  CLINICAL DATA:  Ileus EXAM: PORTABLE ABDOMEN - 1 VIEW COMPARISON:  Portable exam 0707 hours compared to 07/20/2015 FINDINGS: Feeding tube coiled in stomach. Tip of nasogastric tube projects over gastric antrum. LEFT femoral line. Penile prosthesis with reservoir in RIGHT pelvis. Brachytherapy seed implants in prostate bed. Paucity of bowel gas. No obvious bowel dilatation identified. IMPRESSION: Paucity of bowel gas without gross bowel dilatation.  Electronically Signed   By: Lavonia Dana M.D.   On: 07/21/2015 07:51   Dg Abd Portable 1v  07/20/2015  CLINICAL DATA:  Feeding tube placement EXAM: PORTABLE ABDOMEN - 1 VIEW COMPARISON:  07/20/2015 FINDINGS: There is paucity of bowel gas suspicious for ileus. Bilateral chest tube again noted. There is NG feeding tube with tip in distal stomach/pyloric region. IMPRESSION: NG feeding tube with tip in distal stomach/ pyloric region. Electronically Signed   By: Lahoma Crocker M.D.   On: 07/20/2015 11:42   Dg Abd Portable 1v  07/20/2015  CLINICAL DATA:  Feeding tube placement EXAM: PORTABLE ABDOMEN - 1 VIEW COMPARISON:  July 19, 2015 FINDINGS: Feeding tube tip is in the distal stomach. There is also a nasogastric tube with the tip and side-port in the mid the distal stomach. There is a paucity of gas. There is a femoral catheter on the left with the tip in the mid pelvic region, stable. IMPRESSION: Tube  positions as described. There is a paucity of bowel gas. Paucity of gas may be normal but also may be indicative of a degree of enteritis or early ileus. Electronically Signed   By: Lowella Grip III M.D.   On: 07/20/2015 09:17   Dg Abd Portable 1v  07/28/2015  CLINICAL DATA:  Encounter for feeding tube placement. EXAM: PORTABLE ABDOMEN - 1 VIEW COMPARISON:  07/17/2015 and chest radiograph 07/14/2015 FINDINGS: Single view of the abdomen was obtained. There is feeding tube or nasogastric tube coiled in the stomach and the tip is near the gastric fundus. Patient has chest drains which are partially visualized. There is probably a left femoral central venous catheter. There is gas in the stomach. Otherwise, there is paucity of bowel gas in the visualized abdomen. IMPRESSION: Feeding tube in the stomach as described. Electronically Signed   By: Markus Daft M.D.   On: 07/29/2015 16:47   Dg Addison Bailey G Tube Plc W/fl-no Rad  07/21/2015  CLINICAL DATA:  NASO G TUBE PLACEMENT WITH FLUORO Fluoroscopy was utilized by the  requesting physician.  No radiographic interpretation.     Medications:   . sodium chloride    . sodium chloride    . sodium chloride 20 mL/hr at 07/21/15 0800  . amiodarone 30 mg/hr (07/21/15 0800)  . cisatracurium (NIMBEX) infusion 3 mcg/kg/min (07/21/15 1100)  . EPINEPHrine 4 mg in dextrose 5% 250 mL infusion (16 mcg/mL) 15 mcg/min (07/21/15 0800)  . feeding supplement (NEPRO CARB STEADY) 1,000 mL (07/20/15 1622)  . fentaNYL infusion INTRAVENOUS 200 mcg/hr (07/21/15 1100)  . insulin (NOVOLIN-R) infusion 5 Units/hr (07/21/15 1100)  . lactated ringers 20 mL/hr at 07/31/2015 2200  . lactated ringers 20 mL/hr at 07/22/2015 2200  . lidocaine Stopped (07/16/15 0800)  . midazolam (VERSED) infusion 3 mg/hr (07/21/15 0300)  . milrinone 0.125 mcg/kg/min (07/21/15 0800)  . norepinephrine (LEVOPHED) Adult infusion Stopped (07/21/15 1115)  . dialysis replacement fluid (prismasate) 400 mL/hr at 07/21/15 1053  . dialysis replacement fluid (prismasate) 200 mL/hr at 07/12/2015 0729  . dialysate (PRISMASATE) 1,500 mL/hr at 07/21/15 0821  . vasopressin (PITRESSIN) infusion - *FOR SHOCK* Stopped (07/20/15 1700)   . sodium chloride   Intravenous Once  . sodium chloride   Intravenous Once  . amiodarone  150 mg Intravenous Once  . antiseptic oral rinse  7 mL Mouth Rinse 10 times per day  . artificial tears  1 application Both Eyes [id number]  . bisacodyl  10 mg Oral Daily   Or  . bisacodyl  10 mg Rectal Daily  . chlorhexidine gluconate (SAGE KIT)  15 mL Mouth Rinse BID  . cisatracurium  0.05 mg/kg Intravenous Once  . diatrizoate meglumine-sodium  30 mL Oral Once  . docusate sodium  200 mg Oral Daily  . famotidine (PEPCID) IV  20 mg Intravenous Q24H  . feeding supplement (NEPRO CARB STEADY)  1,000 mL Per Tube TID WC  . feeding supplement (PRO-STAT SUGAR FREE 64)  60 mL Oral QID  . fentaNYL (SUBLIMAZE) injection  100 mcg Intravenous Once  . insulin regular  0-10 Units Intravenous TID WC  . magic mouthwash   10 mL Oral TID  . magnesium sulfate  4 g Intravenous Once  . midazolam  2 mg Intravenous Once  . multivitamin  1 tablet Oral QHS  . pantoprazole  40 mg Oral Daily  . piperacillin-tazobactam (ZOSYN)  IV  3.375 g Intravenous Q6H  . sodium chloride flush  3 mL Intravenous Q12H  . vancomycin  1,000 mg Intravenous Q24H   sodium chloride, anticoagulant sodium citrate, fentaNYL, fentaNYL (SUBLIMAZE) injection, heparin, lactated ringers, metoprolol, midazolam, midazolam, midazolam, ondansetron (ZOFRAN) IV, sodium chloride, sodium chloride flush, traMADol  Assessment/ Plan:  Redo CABG 2 with preoperative hemodialysis dependent renal failure and chronic thrombocytopenia-. Massive transfusion requirements for severe peri Operative coagulopathy. Now with significant fluid retention, vasodilatation with high cardiac output and CRT therapy to remove volume as tolerated by blood pressure 1. SP CABG stable weaning pressors  2. DM Controlled by primary team  3. Anemia Controlled Hb 9.2 4. HTN / volume will try to remove 100- 150 cc per hour  5. Will add citrate  5. ESRD TTS Usually now on CRRT due help with volume removal in setting of pressors 6. Thrombocytopenia Appreciate help from Dr Beryle Beams 7. Continues on Vanc and Zosyn     LOS: 10 Cathy Crounse W _0 _1 :52 AM

## 2015-07-21 NOTE — Progress Notes (Signed)
6 Days Post-Op Procedure(s) (LRB): EXPLORATION POST OPERATIVE OPEN HEART (N/A) Subjective:  CRT continues to successfully remove 2 L of fluid daily Weight continues to decrease and clinical edema is improved Cardiac output remains 5 L per minute Chest x-ray remains stable, oxygen requirement FiO2 decreased to 50% Plan return to the OR for sternal closure on Friday, June 16 We'll obtain transthoracic echocardiogram today to assess LV function Will remove balloon pump after sternal closure Nutrition with distal antral tube feed in progress, paralytic to be stopped.  Objective: Vital signs in last 24 hours: Temp:  [96.1 F (35.6 C)-98.8 F (37.1 C)] 97.5 F (36.4 C) (06/14 1100) Pulse Rate:  [88-90] 89 (06/14 1100) Cardiac Rhythm:  [-] Atrial paced;Other (Comment) (06/14 0800) Resp:  [0-35] 35 (06/14 1100) BP: (101-135)/(51-62) 101/55 mmHg (06/14 1100) SpO2:  [95 %-100 %] 98 % (06/14 1100) Arterial Line BP: (91-148)/(46-70) 121/59 mmHg (06/14 1045) FiO2 (%):  [50 %] 50 % (06/14 1100) Weight:  [248 lb 3.8 oz (112.6 kg)] 248 lb 3.8 oz (112.6 kg) (06/14 0500)  Hemodynamic parameters for last 24 hours: PAP: (30-42)/(21-31) 35/25 mmHg CVP:  [10 mmHg-20 mmHg] 15 mmHg CO:  [5.8 L/min-6.9 L/min] 5.8 L/min CI:  [2.7 L/min/m2-3.2 L/min/m2] 2.7 L/min/m2  Intake/Output from previous day: 06/13 0701 - 06/14 0700 In: 5182.7 [I.V.:3949.4; Blood:187; NG/GT:536.3; IV Piggyback:510] Out: 8522 [Emesis/NG output:650; Chest Tube:180] Intake/Output this shift: Total I/O In: 838.7 [I.V.:458.7; NG/GT:170; IV Piggyback:210] Out: 1262 [Other:1252; Chest Tube:10]  Sedated on vent Pupils react, scleral edema present Breath sounds coarse, equal Heart rhythm regular without murmur Abdomen soft Skin with jaundice hew Extremities warm  Lab Results:  Recent Labs  07/20/15 1517  07/21/15 0358 07/21/15 0802  WBC 17.6*  --  17.9*  --   HGB 9.5*  < > 9.4* 9.2*  HCT 28.2*  < > 27.7* 27.0*  PLT  45*  --  48*  --   < > = values in this interval not displayed. BMET:  Recent Labs  07/20/15 1517  07/21/15 0358 07/21/15 0802  NA 134*  < > 134* 138  K 4.5  < > 4.5 4.1  CL 103  < > 103 104  CO2 22  --  22  --   GLUCOSE 117*  < > 128* 121*  BUN 31*  < > 38* 35*  CREATININE 2.81*  < > 2.75* 2.80*  CALCIUM 7.4*  --  7.4*  --   < > = values in this interval not displayed.  PT/INR: No results for input(s): LABPROT, INR in the last 72 hours. ABG    Component Value Date/Time   PHART 7.429 07/21/2015 0415   HCO3 23.1 07/21/2015 0415   TCO2 22 07/21/2015 0802   ACIDBASEDEF 0.6 07/21/2015 0415   O2SAT 96.4 07/21/2015 0415   CBG (last 3)   Recent Labs  07/21/15 0455 07/21/15 0554 07/21/15 0651  GLUCAP 138* 130* 129*    Assessment/Plan: S/P Procedure(s) (LRB): EXPLORATION POST OPERATIVE OPEN HEART (N/A) Continue CRT to remove fluid Planned sternal closure June 16, family has been notified Plan to remove balloon pump after sternum closed and patient demonstrates stability  LOS: 15 days    Tharon Aquas Trigt III 07/21/2015

## 2015-07-21 NOTE — Progress Notes (Signed)
  Echocardiogram 2D Echocardiogram has been performed.  Joseph [name] M 07/21/2015, 11:53 AM

## 2015-07-22 ENCOUNTER — Inpatient Hospital Stay (HOSPITAL_COMMUNITY): Payer: Medicare Other

## 2015-07-22 LAB — POCT I-STAT, CHEM 8
BUN: 31 mg/dL — AB (ref 6–20)
BUN: 31 mg/dL — ABNORMAL HIGH (ref 6–20)
BUN: 31 mg/dL — ABNORMAL HIGH (ref 6–20)
BUN: 32 mg/dL — AB (ref 6–20)
BUN: 32 mg/dL — ABNORMAL HIGH (ref 6–20)
BUN: 34 mg/dL — AB (ref 6–20)
BUN: 34 mg/dL — ABNORMAL HIGH (ref 6–20)
BUN: 35 mg/dL — ABNORMAL HIGH (ref 6–20)
BUN: 35 mg/dL — ABNORMAL HIGH (ref 6–20)
BUN: 35 mg/dL — ABNORMAL HIGH (ref 6–20)
BUN: 35 mg/dL — ABNORMAL HIGH (ref 6–20)
BUN: 35 mg/dL — ABNORMAL HIGH (ref 6–20)
BUN: 39 mg/dL — ABNORMAL HIGH (ref 6–20)
BUN: 40 mg/dL — ABNORMAL HIGH (ref 6–20)
BUN: 40 mg/dL — ABNORMAL HIGH (ref 6–20)
BUN: 40 mg/dL — ABNORMAL HIGH (ref 6–20)
BUN: 42 mg/dL — ABNORMAL HIGH (ref 6–20)
BUN: 42 mg/dL — ABNORMAL HIGH (ref 6–20)
BUN: 42 mg/dL — ABNORMAL HIGH (ref 6–20)
BUN: 43 mg/dL — ABNORMAL HIGH (ref 6–20)
BUN: 43 mg/dL — ABNORMAL HIGH (ref 6–20)
BUN: 43 mg/dL — ABNORMAL HIGH (ref 6–20)
BUN: 44 mg/dL — ABNORMAL HIGH (ref 6–20)
BUN: 49 mg/dL — AB (ref 6–20)
BUN: 50 mg/dL — ABNORMAL HIGH (ref 6–20)
BUN: 54 mg/dL — AB (ref 6–20)
CALCIUM ION: 0.39 mmol/L — AB (ref 1.13–1.30)
CALCIUM ION: 0.41 mmol/L — AB (ref 1.13–1.30)
CALCIUM ION: 0.5 mmol/L — AB (ref 1.13–1.30)
CALCIUM ION: 0.51 mmol/L — AB (ref 1.13–1.30)
CALCIUM ION: 0.6 mmol/L — AB (ref 1.13–1.30)
CALCIUM ION: 1.04 mmol/L — AB (ref 1.13–1.30)
CALCIUM ION: 1.05 mmol/L — AB (ref 1.13–1.30)
CALCIUM ION: 1.06 mmol/L — AB (ref 1.13–1.30)
CALCIUM ION: 1.06 mmol/L — AB (ref 1.13–1.30)
CALCIUM ION: 1.06 mmol/L — AB (ref 1.13–1.30)
CALCIUM ION: 1.08 mmol/L — AB (ref 1.13–1.30)
CALCIUM ION: 1.09 mmol/L — AB (ref 1.13–1.30)
CALCIUM ION: 1.1 mmol/L — AB (ref 1.13–1.30)
CHLORIDE: 99 mmol/L — AB (ref 101–111)
CHLORIDE: 98 mmol/L — AB (ref 101–111)
CHLORIDE: 95 mmol/L — AB (ref 101–111)
CHLORIDE: 97 mmol/L — AB (ref 101–111)
CHLORIDE: 91 mmol/L — AB (ref 101–111)
CHLORIDE: 95 mmol/L — AB (ref 101–111)
CHLORIDE: 90 mmol/L — AB (ref 101–111)
CHLORIDE: 93 mmol/L — AB (ref 101–111)
CHLORIDE: 94 mmol/L — AB (ref 101–111)
CHLORIDE: 91 mmol/L — AB (ref 101–111)
CREATININE: 1.7 mg/dL — AB (ref 0.61–1.24)
CREATININE: 1.7 mg/dL — AB (ref 0.61–1.24)
CREATININE: 2 mg/dL — AB (ref 0.61–1.24)
CREATININE: 2 mg/dL — AB (ref 0.61–1.24)
CREATININE: 2.4 mg/dL — AB (ref 0.61–1.24)
CREATININE: 2.6 mg/dL — AB (ref 0.61–1.24)
CREATININE: 2.7 mg/dL — AB (ref 0.61–1.24)
CREATININE: 3 mg/dL — AB (ref 0.61–1.24)
Calcium, Ion: 1.01 mmol/L — ABNORMAL LOW (ref 1.13–1.30)
Calcium, Ion: 0.49 mmol/L — CL (ref 1.13–1.30)
Calcium, Ion: 0.53 mmol/L — CL (ref 1.13–1.30)
Calcium, Ion: 0.54 mmol/L — CL (ref 1.13–1.30)
Calcium, Ion: 0.59 mmol/L — CL (ref 1.13–1.30)
Calcium, Ion: 0.55 mmol/L — CL (ref 1.13–1.30)
Calcium, Ion: 1.02 mmol/L — ABNORMAL LOW (ref 1.13–1.30)
Calcium, Ion: 1.05 mmol/L — ABNORMAL LOW (ref 1.13–1.30)
Calcium, Ion: 0.56 mmol/L — CL (ref 1.13–1.30)
Calcium, Ion: 1.06 mmol/L — ABNORMAL LOW (ref 1.13–1.30)
Calcium, Ion: 0.62 mmol/L — CL (ref 1.13–1.30)
Calcium, Ion: 0.62 mmol/L — CL (ref 1.13–1.30)
Calcium, Ion: 1.11 mmol/L — ABNORMAL LOW (ref 1.13–1.30)
Chloride: 100 mmol/L — ABNORMAL LOW (ref 101–111)
Chloride: 97 mmol/L — ABNORMAL LOW (ref 101–111)
Chloride: 99 mmol/L — ABNORMAL LOW (ref 101–111)
Chloride: 93 mmol/L — ABNORMAL LOW (ref 101–111)
Chloride: 94 mmol/L — ABNORMAL LOW (ref 101–111)
Chloride: 95 mmol/L — ABNORMAL LOW (ref 101–111)
Chloride: 96 mmol/L — ABNORMAL LOW (ref 101–111)
Chloride: 96 mmol/L — ABNORMAL LOW (ref 101–111)
Chloride: 98 mmol/L — ABNORMAL LOW (ref 101–111)
Chloride: 95 mmol/L — ABNORMAL LOW (ref 101–111)
Chloride: 96 mmol/L — ABNORMAL LOW (ref 101–111)
Chloride: 90 mmol/L — ABNORMAL LOW (ref 101–111)
Chloride: 91 mmol/L — ABNORMAL LOW (ref 101–111)
Chloride: 87 mmol/L — ABNORMAL LOW (ref 101–111)
Chloride: 93 mmol/L — ABNORMAL LOW (ref 101–111)
Chloride: 88 mmol/L — ABNORMAL LOW (ref 101–111)
Creatinine, Ser: 2.6 mg/dL — ABNORMAL HIGH (ref 0.61–1.24)
Creatinine, Ser: 2.9 mg/dL — ABNORMAL HIGH (ref 0.61–1.24)
Creatinine, Ser: 2 mg/dL — ABNORMAL HIGH (ref 0.61–1.24)
Creatinine, Ser: 2 mg/dL — ABNORMAL HIGH (ref 0.61–1.24)
Creatinine, Ser: 2 mg/dL — ABNORMAL HIGH (ref 0.61–1.24)
Creatinine, Ser: 2.8 mg/dL — ABNORMAL HIGH (ref 0.61–1.24)
Creatinine, Ser: 3 mg/dL — ABNORMAL HIGH (ref 0.61–1.24)
Creatinine, Ser: 1.8 mg/dL — ABNORMAL HIGH (ref 0.61–1.24)
Creatinine, Ser: 2 mg/dL — ABNORMAL HIGH (ref 0.61–1.24)
Creatinine, Ser: 2 mg/dL — ABNORMAL HIGH (ref 0.61–1.24)
Creatinine, Ser: 2.7 mg/dL — ABNORMAL HIGH (ref 0.61–1.24)
Creatinine, Ser: 2.7 mg/dL — ABNORMAL HIGH (ref 0.61–1.24)
Creatinine, Ser: 1.7 mg/dL — ABNORMAL HIGH (ref 0.61–1.24)
Creatinine, Ser: 2.7 mg/dL — ABNORMAL HIGH (ref 0.61–1.24)
Creatinine, Ser: 2.7 mg/dL — ABNORMAL HIGH (ref 0.61–1.24)
Creatinine, Ser: 1.9 mg/dL — ABNORMAL HIGH (ref 0.61–1.24)
Creatinine, Ser: 2.7 mg/dL — ABNORMAL HIGH (ref 0.61–1.24)
Creatinine, Ser: 1.8 mg/dL — ABNORMAL HIGH (ref 0.61–1.24)
GLUCOSE: 106 mg/dL — AB (ref 65–99)
GLUCOSE: 108 mg/dL — AB (ref 65–99)
GLUCOSE: 116 mg/dL — AB (ref 65–99)
GLUCOSE: 142 mg/dL — AB (ref 65–99)
GLUCOSE: 155 mg/dL — AB (ref 65–99)
GLUCOSE: 184 mg/dL — AB (ref 65–99)
GLUCOSE: 190 mg/dL — AB (ref 65–99)
GLUCOSE: 200 mg/dL — AB (ref 65–99)
GLUCOSE: 93 mg/dL (ref 65–99)
GLUCOSE: 99 mg/dL (ref 65–99)
Glucose, Bld: 110 mg/dL — ABNORMAL HIGH (ref 65–99)
Glucose, Bld: 141 mg/dL — ABNORMAL HIGH (ref 65–99)
Glucose, Bld: 181 mg/dL — ABNORMAL HIGH (ref 65–99)
Glucose, Bld: 187 mg/dL — ABNORMAL HIGH (ref 65–99)
Glucose, Bld: 88 mg/dL (ref 65–99)
Glucose, Bld: 143 mg/dL — ABNORMAL HIGH (ref 65–99)
Glucose, Bld: 209 mg/dL — ABNORMAL HIGH (ref 65–99)
Glucose, Bld: 88 mg/dL (ref 65–99)
Glucose, Bld: 92 mg/dL (ref 65–99)
Glucose, Bld: 96 mg/dL (ref 65–99)
Glucose, Bld: 105 mg/dL — ABNORMAL HIGH (ref 65–99)
Glucose, Bld: 200 mg/dL — ABNORMAL HIGH (ref 65–99)
Glucose, Bld: 103 mg/dL — ABNORMAL HIGH (ref 65–99)
Glucose, Bld: 200 mg/dL — ABNORMAL HIGH (ref 65–99)
Glucose, Bld: 106 mg/dL — ABNORMAL HIGH (ref 65–99)
Glucose, Bld: 206 mg/dL — ABNORMAL HIGH (ref 65–99)
HCT: 30 % — ABNORMAL LOW (ref 39.0–52.0)
HCT: 30 % — ABNORMAL LOW (ref 39.0–52.0)
HCT: 32 % — ABNORMAL LOW (ref 39.0–52.0)
HCT: 34 % — ABNORMAL LOW (ref 39.0–52.0)
HCT: 34 % — ABNORMAL LOW (ref 39.0–52.0)
HCT: 35 % — ABNORMAL LOW (ref 39.0–52.0)
HCT: 35 % — ABNORMAL LOW (ref 39.0–52.0)
HCT: 33 % — ABNORMAL LOW (ref 39.0–52.0)
HCT: 30 % — ABNORMAL LOW (ref 39.0–52.0)
HCT: 30 % — ABNORMAL LOW (ref 39.0–52.0)
HCT: 33 % — ABNORMAL LOW (ref 39.0–52.0)
HEMATOCRIT: 29 % — AB (ref 39.0–52.0)
HEMATOCRIT: 30 % — AB (ref 39.0–52.0)
HEMATOCRIT: 31 % — AB (ref 39.0–52.0)
HEMATOCRIT: 31 % — AB (ref 39.0–52.0)
HEMATOCRIT: 32 % — AB (ref 39.0–52.0)
HEMATOCRIT: 34 % — AB (ref 39.0–52.0)
HEMATOCRIT: 34 % — AB (ref 39.0–52.0)
HEMATOCRIT: 34 % — AB (ref 39.0–52.0)
HEMATOCRIT: 35 % — AB (ref 39.0–52.0)
HEMATOCRIT: 35 % — AB (ref 39.0–52.0)
HEMATOCRIT: 35 % — AB (ref 39.0–52.0)
HEMATOCRIT: 35 % — AB (ref 39.0–52.0)
HEMATOCRIT: 36 % — AB (ref 39.0–52.0)
HEMATOCRIT: 36 % — AB (ref 39.0–52.0)
HEMATOCRIT: 64 % — AB (ref 39.0–52.0)
HEMOGLOBIN: 10.2 g/dL — AB (ref 13.0–17.0)
HEMOGLOBIN: 10.2 g/dL — AB (ref 13.0–17.0)
HEMOGLOBIN: 10.2 g/dL — AB (ref 13.0–17.0)
HEMOGLOBIN: 10.2 g/dL — AB (ref 13.0–17.0)
HEMOGLOBIN: 10.5 g/dL — AB (ref 13.0–17.0)
HEMOGLOBIN: 10.9 g/dL — AB (ref 13.0–17.0)
HEMOGLOBIN: 10.9 g/dL — AB (ref 13.0–17.0)
HEMOGLOBIN: 11.2 g/dL — AB (ref 13.0–17.0)
HEMOGLOBIN: 11.2 g/dL — AB (ref 13.0–17.0)
HEMOGLOBIN: 11.6 g/dL — AB (ref 13.0–17.0)
HEMOGLOBIN: 11.6 g/dL — AB (ref 13.0–17.0)
HEMOGLOBIN: 11.6 g/dL — AB (ref 13.0–17.0)
HEMOGLOBIN: 11.6 g/dL — AB (ref 13.0–17.0)
HEMOGLOBIN: 11.6 g/dL — AB (ref 13.0–17.0)
HEMOGLOBIN: 11.9 g/dL — AB (ref 13.0–17.0)
HEMOGLOBIN: 11.9 g/dL — AB (ref 13.0–17.0)
HEMOGLOBIN: 11.9 g/dL — AB (ref 13.0–17.0)
HEMOGLOBIN: 11.9 g/dL — AB (ref 13.0–17.0)
HEMOGLOBIN: 11.9 g/dL — AB (ref 13.0–17.0)
HEMOGLOBIN: 11.9 g/dL — AB (ref 13.0–17.0)
HEMOGLOBIN: 12.2 g/dL — AB (ref 13.0–17.0)
HEMOGLOBIN: 12.2 g/dL — AB (ref 13.0–17.0)
HEMOGLOBIN: 21.8 g/dL — AB (ref 13.0–17.0)
HEMOGLOBIN: 9.9 g/dL — AB (ref 13.0–17.0)
Hemoglobin: 10.2 g/dL — ABNORMAL LOW (ref 13.0–17.0)
Hemoglobin: 10.5 g/dL — ABNORMAL LOW (ref 13.0–17.0)
POTASSIUM: 3.8 mmol/L (ref 3.5–5.1)
POTASSIUM: 3.8 mmol/L (ref 3.5–5.1)
POTASSIUM: 3.9 mmol/L (ref 3.5–5.1)
POTASSIUM: 3.9 mmol/L (ref 3.5–5.1)
POTASSIUM: 3.9 mmol/L (ref 3.5–5.1)
POTASSIUM: 4 mmol/L (ref 3.5–5.1)
POTASSIUM: 4 mmol/L (ref 3.5–5.1)
POTASSIUM: 4 mmol/L (ref 3.5–5.1)
POTASSIUM: 4 mmol/L (ref 3.5–5.1)
POTASSIUM: 4 mmol/L (ref 3.5–5.1)
POTASSIUM: 4.2 mmol/L (ref 3.5–5.1)
POTASSIUM: 4.3 mmol/L (ref 3.5–5.1)
POTASSIUM: 4.4 mmol/L (ref 3.5–5.1)
POTASSIUM: 4.5 mmol/L (ref 3.5–5.1)
Potassium: 4 mmol/L (ref 3.5–5.1)
Potassium: 4.1 mmol/L (ref 3.5–5.1)
Potassium: 4.1 mmol/L (ref 3.5–5.1)
Potassium: 4 mmol/L (ref 3.5–5.1)
Potassium: 4.2 mmol/L (ref 3.5–5.1)
Potassium: 4.3 mmol/L (ref 3.5–5.1)
Potassium: 3.9 mmol/L (ref 3.5–5.1)
Potassium: 3.9 mmol/L (ref 3.5–5.1)
Potassium: 4.3 mmol/L (ref 3.5–5.1)
Potassium: 3.9 mmol/L (ref 3.5–5.1)
Potassium: 4.2 mmol/L (ref 3.5–5.1)
Potassium: 4 mmol/L (ref 3.5–5.1)
SODIUM: 136 mmol/L (ref 135–145)
SODIUM: 136 mmol/L (ref 135–145)
SODIUM: 136 mmol/L (ref 135–145)
SODIUM: 137 mmol/L (ref 135–145)
SODIUM: 137 mmol/L (ref 135–145)
SODIUM: 137 mmol/L (ref 135–145)
SODIUM: 137 mmol/L (ref 135–145)
SODIUM: 137 mmol/L (ref 135–145)
SODIUM: 137 mmol/L (ref 135–145)
SODIUM: 138 mmol/L (ref 135–145)
SODIUM: 138 mmol/L (ref 135–145)
SODIUM: 138 mmol/L (ref 135–145)
SODIUM: 138 mmol/L (ref 135–145)
SODIUM: 139 mmol/L (ref 135–145)
SODIUM: 139 mmol/L (ref 135–145)
SODIUM: 139 mmol/L (ref 135–145)
SODIUM: 139 mmol/L (ref 135–145)
SODIUM: 140 mmol/L (ref 135–145)
SODIUM: 140 mmol/L (ref 135–145)
SODIUM: 141 mmol/L (ref 135–145)
SODIUM: 142 mmol/L (ref 135–145)
SODIUM: 144 mmol/L (ref 135–145)
Sodium: 137 mmol/L (ref 135–145)
Sodium: 137 mmol/L (ref 135–145)
Sodium: 139 mmol/L (ref 135–145)
Sodium: 138 mmol/L (ref 135–145)
TCO2: 25 mmol/L (ref 0–100)
TCO2: 27 mmol/L (ref 0–100)
TCO2: 27 mmol/L (ref 0–100)
TCO2: 27 mmol/L (ref 0–100)
TCO2: 27 mmol/L (ref 0–100)
TCO2: 28 mmol/L (ref 0–100)
TCO2: 28 mmol/L (ref 0–100)
TCO2: 28 mmol/L (ref 0–100)
TCO2: 29 mmol/L (ref 0–100)
TCO2: 30 mmol/L (ref 0–100)
TCO2: 30 mmol/L (ref 0–100)
TCO2: 30 mmol/L (ref 0–100)
TCO2: 30 mmol/L (ref 0–100)
TCO2: 30 mmol/L (ref 0–100)
TCO2: 30 mmol/L (ref 0–100)
TCO2: 31 mmol/L (ref 0–100)
TCO2: 31 mmol/L (ref 0–100)
TCO2: 31 mmol/L (ref 0–100)
TCO2: 31 mmol/L (ref 0–100)
TCO2: 31 mmol/L (ref 0–100)
TCO2: 32 mmol/L (ref 0–100)
TCO2: 32 mmol/L (ref 0–100)
TCO2: 32 mmol/L (ref 0–100)
TCO2: 33 mmol/L (ref 0–100)
TCO2: 33 mmol/L (ref 0–100)
TCO2: 34 mmol/L (ref 0–100)

## 2015-07-22 LAB — RENAL FUNCTION PANEL
Albumin: 1.8 g/dL — ABNORMAL LOW (ref 3.5–5.0)
Albumin: 2.2 g/dL — ABNORMAL LOW (ref 3.5–5.0)
Anion gap: 14 (ref 5–15)
Anion gap: 13 (ref 5–15)
BUN: 45 mg/dL — ABNORMAL HIGH (ref 6–20)
BUN: 42 mg/dL — ABNORMAL HIGH (ref 6–20)
CO2: 27 mmol/L (ref 22–32)
CO2: 30 mmol/L (ref 22–32)
Calcium: 8.9 mg/dL (ref 8.9–10.3)
Calcium: 9 mg/dL (ref 8.9–10.3)
Chloride: 97 mmol/L — ABNORMAL LOW (ref 101–111)
Chloride: 96 mmol/L — ABNORMAL LOW (ref 101–111)
Creatinine, Ser: 2.53 mg/dL — ABNORMAL HIGH (ref 0.61–1.24)
Creatinine, Ser: 2.49 mg/dL — ABNORMAL HIGH (ref 0.61–1.24)
GFR calc Af Amer: 29 mL/min — ABNORMAL LOW (ref >60)
GFR calc Af Amer: 29 mL/min — ABNORMAL LOW (ref >60)
GFR calc non Af Amer: 25 mL/min — ABNORMAL LOW (ref >60)
GFR calc non Af Amer: 25 mL/min — ABNORMAL LOW (ref >60)
Glucose, Bld: 107 mg/dL — ABNORMAL HIGH (ref 65–99)
Glucose, Bld: 107 mg/dL — ABNORMAL HIGH (ref 65–99)
Phosphorus: 3.6 mg/dL (ref 2.5–4.6)
Phosphorus: 4.4 mg/dL (ref 2.5–4.6)
Potassium: 4.2 mmol/L (ref 3.5–5.1)
Potassium: 4.1 mmol/L (ref 3.5–5.1)
Sodium: 138 mmol/L (ref 135–145)
Sodium: 139 mmol/L (ref 135–145)

## 2015-07-22 LAB — MAGNESIUM: Magnesium: 2.5 mg/dL — ABNORMAL HIGH (ref 1.7–2.4)

## 2015-07-22 LAB — COMPREHENSIVE METABOLIC PANEL
ALT: 1105 U/L — ABNORMAL HIGH (ref 17–63)
AST: 230 U/L — ABNORMAL HIGH (ref 15–41)
Albumin: 1.9 g/dL — ABNORMAL LOW (ref 3.5–5.0)
Alkaline Phosphatase: 190 U/L — ABNORMAL HIGH (ref 38–126)
Anion gap: 15 (ref 5–15)
BUN: 44 mg/dL — ABNORMAL HIGH (ref 6–20)
CO2: 26 mmol/L (ref 22–32)
Calcium: 9 mg/dL (ref 8.9–10.3)
Chloride: 97 mmol/L — ABNORMAL LOW (ref 101–111)
Creatinine, Ser: 2.59 mg/dL — ABNORMAL HIGH (ref 0.61–1.24)
GFR calc Af Amer: 28 mL/min — ABNORMAL LOW (ref >60)
GFR calc non Af Amer: 24 mL/min — ABNORMAL LOW (ref >60)
Glucose, Bld: 106 mg/dL — ABNORMAL HIGH (ref 65–99)
Potassium: 4.2 mmol/L (ref 3.5–5.1)
Sodium: 138 mmol/L (ref 135–145)
Total Bilirubin: 20 mg/dL (ref 0.3–1.2)
Total Protein: 5.2 g/dL — ABNORMAL LOW (ref 6.5–8.1)

## 2015-07-22 LAB — POCT I-STAT 3, ART BLOOD GAS (G3+)
Acid-Base Excess: 6 mmol/L — ABNORMAL HIGH (ref 0.0–2.0)
Acid-Base Excess: 11 mmol/L — ABNORMAL HIGH (ref 0.0–2.0)
BICARBONATE: 34.9 meq/L — AB (ref 20.0–24.0)
Bicarbonate: 28.5 mEq/L — ABNORMAL HIGH (ref 20.0–24.0)
O2 SAT: 97 %
O2 Saturation: 98 %
PCO2 ART: 44 mmHg (ref 35.0–45.0)
PH ART: 7.531 — AB (ref 7.350–7.450)
PH ART: 7.508 — AB (ref 7.350–7.450)
TCO2: 30 mmol/L (ref 0–100)
TCO2: 36 mmol/L (ref 0–100)
pCO2 arterial: 34 mmHg — ABNORMAL LOW (ref 35.0–45.0)
pO2, Arterial: 84 mmHg (ref 80.0–100.0)
pO2, Arterial: 90 mmHg (ref 80.0–100.0)

## 2015-07-22 LAB — GLUCOSE, CAPILLARY
GLUCOSE-CAPILLARY: 102 mg/dL — AB (ref 65–99)
GLUCOSE-CAPILLARY: 104 mg/dL — AB (ref 65–99)
GLUCOSE-CAPILLARY: 105 mg/dL — AB (ref 65–99)
GLUCOSE-CAPILLARY: 110 mg/dL — AB (ref 65–99)
GLUCOSE-CAPILLARY: 110 mg/dL — AB (ref 65–99)
GLUCOSE-CAPILLARY: 112 mg/dL — AB (ref 65–99)
GLUCOSE-CAPILLARY: 118 mg/dL — AB (ref 65–99)
GLUCOSE-CAPILLARY: 137 mg/dL — AB (ref 65–99)
GLUCOSE-CAPILLARY: 160 mg/dL — AB (ref 65–99)
GLUCOSE-CAPILLARY: 85 mg/dL (ref 65–99)
Glucose-Capillary: 155 mg/dL — ABNORMAL HIGH (ref 65–99)
Glucose-Capillary: 172 mg/dL — ABNORMAL HIGH (ref 65–99)
Glucose-Capillary: 157 mg/dL — ABNORMAL HIGH (ref 65–99)
Glucose-Capillary: 158 mg/dL — ABNORMAL HIGH (ref 65–99)
Glucose-Capillary: 148 mg/dL — ABNORMAL HIGH (ref 65–99)
Glucose-Capillary: 111 mg/dL — ABNORMAL HIGH (ref 65–99)
Glucose-Capillary: 111 mg/dL — ABNORMAL HIGH (ref 65–99)
Glucose-Capillary: 102 mg/dL — ABNORMAL HIGH (ref 65–99)
Glucose-Capillary: 95 mg/dL (ref 65–99)
Glucose-Capillary: 144 mg/dL — ABNORMAL HIGH (ref 65–99)
Glucose-Capillary: 149 mg/dL — ABNORMAL HIGH (ref 65–99)
Glucose-Capillary: 114 mg/dL — ABNORMAL HIGH (ref 65–99)
Glucose-Capillary: 107 mg/dL — ABNORMAL HIGH (ref 65–99)

## 2015-07-22 LAB — CBC
HCT: 28.2 % — ABNORMAL LOW (ref 39.0–52.0)
Hemoglobin: 9.6 g/dL — ABNORMAL LOW (ref 13.0–17.0)
MCH: 28.5 pg (ref 26.0–34.0)
MCHC: 34 g/dL (ref 30.0–36.0)
MCV: 83.7 fL (ref 78.0–100.0)
Platelets: 52 10*3/uL — ABNORMAL LOW (ref 150–400)
RBC: 3.37 MIL/uL — ABNORMAL LOW (ref 4.22–5.81)
RDW: 16.7 % — ABNORMAL HIGH (ref 11.5–15.5)
WBC: 19 10*3/uL — ABNORMAL HIGH (ref 4.0–10.5)

## 2015-07-22 LAB — CARBOXYHEMOGLOBIN
Carboxyhemoglobin: 1.4 % (ref 0.5–1.5)
Methemoglobin: 0.9 % (ref 0.0–1.5)
O2 Saturation: 72.3 %
Total hemoglobin: 11 g/dL — ABNORMAL LOW (ref 13.5–18.0)

## 2015-07-22 LAB — PHOSPHORUS: PHOSPHORUS: 3.6 mg/dL (ref 2.5–4.6)

## 2015-07-22 LAB — PREPARE RBC (CROSSMATCH)

## 2015-07-22 LAB — BILIRUBIN, FRACTIONATED(TOT/DIR/INDIR)
Bilirubin, Direct: 14.8 mg/dL — ABNORMAL HIGH (ref 0.1–0.5)
Indirect Bilirubin: 6.4 mg/dL — ABNORMAL HIGH (ref 0.3–0.9)
Total Bilirubin: 21.2 mg/dL (ref 0.3–1.2)

## 2015-07-22 MED ORDER — METOCLOPRAMIDE HCL 5 MG/ML IJ SOLN
10.0000 mg | Freq: Four times a day (QID) | INTRAMUSCULAR | Status: DC
  Administered 2015-07-22 – 2015-08-02 (×40): 10 mg via INTRAVENOUS
  Filled 2015-07-22 (×40): qty 2

## 2015-07-22 MED ORDER — DARBEPOETIN ALFA 100 MCG/0.5ML IJ SOSY
100.0000 ug | PREFILLED_SYRINGE | INTRAMUSCULAR | Status: DC
  Administered 2015-07-23 – 2015-08-06 (×3): 100 ug via SUBCUTANEOUS
  Filled 2015-07-22 (×5): qty 0.5

## 2015-07-22 MED ORDER — ALBUMIN HUMAN 25 % IV SOLN
12.5000 g | Freq: Four times a day (QID) | INTRAVENOUS | Status: AC
  Administered 2015-07-22 (×3): 12.5 g via INTRAVENOUS
  Filled 2015-07-22 (×2): qty 50

## 2015-07-22 MED ORDER — NEPRO/CARBSTEADY PO LIQD
1000.0000 mL | ORAL | Status: DC
  Administered 2015-07-22: 1000 mL
  Filled 2015-07-22 (×3): qty 1000

## 2015-07-22 MED ORDER — ANTICOAGULANT SODIUM CITRATE 4% (200MG/5ML) IV SOLN
5.0000 mL | Freq: Once | Status: AC
  Administered 2015-07-23: 2.7 mL via INTRAVENOUS
  Filled 2015-07-22 (×2): qty 250

## 2015-07-22 NOTE — Progress Notes (Signed)
[facility] BRIEF SICU PROGRESS NOTE  7 Days Post-Op  S/P Procedure(s) (LRB): EXPLORATION POST OPERATIVE OPEN HEART (N/A)   Sedated and unresponsive on vent AAI paced w/ stable hemodynamics on IABP 1:2 with levophed, epi and milrinone drips O2 sats 97-100% on 40% FiO2  Plan: Continue current plan  Rexene Alberts, MD 07/22/2015 8:20 PM

## 2015-07-22 NOTE — Plan of Care (Signed)
Dr. Roxy Manns notified of no waveform on IABP

## 2015-07-22 NOTE — Progress Notes (Signed)
7 Days Post-Op Procedure(s) (LRB): EXPLORATION POST OPERATIVE OPEN HEART (N/A) Subjective: Bilirubin now 20 - mixed elevation of direct and indirect Continues to lose voume with CRT Plan sternal closure tomorrow Platelets and FFP ordered for this pm  Objective: Vital signs in last 24 hours: Temp:  [97 F (36.1 C)-99.9 F (37.7 C)] 98.2 F (36.8 C) (06/15 1330) Pulse Rate:  [39-127] 43 (06/15 1330) Cardiac Rhythm:  [-] Atrial paced (06/15 0734) Resp:  [0-36] 36 (06/15 1330) BP: (90-137)/(42-52) 90/42 mmHg (06/15 1131) SpO2:  [94 %-100 %] 100 % (06/15 1330) Arterial Line BP: (81-253)/(40-246) 91/49 mmHg (06/15 1330) FiO2 (%):  [40 %-50 %] 40 % (06/15 1131) Weight:  [239 lb 10.2 oz (108.7 kg)] 239 lb 10.2 oz (108.7 kg) (06/15 0500)  Hemodynamic parameters for last 24 hours: PAP: (28-40)/(19-31) 37/28 mmHg CVP:  [7 mmHg-19 mmHg] 15 mmHg CO:  [6.3 L/min-7.5 L/min] 6.3 L/min CI:  [3 L/min/m2-3.5 L/min/m2] 3 L/min/m2  Intake/Output from previous day: 06/14 0701 - 06/15 0700 In: 5048.9 [I.V.:3818.9; NG/GT:670; IV Piggyback:560] Out: 8174 [Emesis/NG output:1060; Chest Tube:170] Intake/Output this shift: Total I/O In: 1611.5 [I.V.:1151.5; NG/GT:60; IV Piggyback:400] Out: 2116 [Other:2116]       Exam    General- alert and comfortable   Lungs- clear without rales, wheezes   Cor- regular rate and rhythm, no murmur , gallop   Abdomen- soft, non-tender   Extremities - warm, non-tender, minimal edema   Neuro- oriented, appropriate, no focal weakness   Lab Results:  Recent Labs  07/21/15 1630  07/22/15 0402  07/22/15 1325 07/22/15 1330  WBC 18.2*  --  19.0*  --   --   --   HGB 9.3*  < > 9.6*  < > 10.2* 12.2*  HCT 27.5*  < > 28.2*  < > 30.0* 36.0*  PLT 51*  --  52*  --   --   --   < > = values in this interval not displayed. BMET:  Recent Labs  07/22/15 0402 07/22/15 0403  07/22/15 1325 07/22/15 1330  NA 138 138  < > 137 139  K 4.2 4.2  < > 4.0 3.8  CL 97* 97*  <  > 95* 90*  CO2 26 27  --   --   --   GLUCOSE 106* 107*  < > 116* 200*  BUN 44* 45*  < > 43* 31*  CREATININE 2.59* 2.53*  < > 2.70* 1.70*  CALCIUM 9.0 8.9  --   --   --   < > = values in this interval not displayed.  PT/INR: No results for input(s): LABPROT, INR in the last 72 hours. ABG    Component Value Date/Time   PHART 7.531* 07/22/2015 0325   HCO3 28.5* 07/22/2015 0325   TCO2 30 07/22/2015 1330   ACIDBASEDEF 0.6 07/21/2015 0415   O2SAT 72.3 07/22/2015 0355   CBG (last 3)   Recent Labs  07/21/15 2131 07/21/15 2320 07/22/15 0014  GLUCAP 148* 160* 137*    Assessment/Plan: S/P Procedure(s) (LRB): EXPLORATION POST OPERATIVE OPEN HEART (N/A) Cont current care Close sternum in OR tomorrow   LOS: 16 days    Tharon Aquas Trigt III 07/22/2015

## 2015-07-22 NOTE — Anesthesia Preprocedure Evaluation (Addendum)
Anesthesia Evaluation  Patient identified by MRN, date of birth, ID band Patient unresponsive    Reviewed: Allergy & Precautions, Patient's Chart, lab work & pertinent test results, Unable to perform ROS - Chart review only  Airway Mallampati: Intubated       Dental   Pulmonary former smoker,     + decreased breath sounds      Cardiovascular hypertension, Pt. on medications and Pt. on home beta blockers + angina + CAD, + Past MI, + CABG and +CHF   Rhythm:Regular Rate:Normal  07/21/15 Echo: Left ventricle: The cavity size was normal. Wall thickness was  increased in a pattern of mild LVH. Systolic function was  moderately to severely reduced. The estimated ejection fraction  was in the range of 30% to 35%. Diffuse hypokinesis. There is  akinesis of the entireinferior myocardium. Doppler parameters are  consistent with abnormal left ventricular relaxation (grade 1  diastolic dysfunction). - Aortic valve: Trileaflet; mildly thickened, mildly calcified  leaflets. - Right ventricle: The cavity size was mildly dilated. Wall  thickness was normal. Systolic function was moderately reduced.   Neuro/Psych PSYCHIATRIC DISORDERS negative neurological ROS     GI/Hepatic GERD  ,  Endo/Other  diabetes, Type 2, Insulin Dependent  Renal/GU ESRF and DialysisRenal disease  negative genitourinary   Musculoskeletal   Abdominal (+) + obese,   Peds negative pediatric ROS (+)  Hematology  (+) anemia , Thrombocytopenia   Anesthesia Other Findings   Reproductive/Obstetrics                           Lab Results  Component Value Date   WBC 18.9* 07/12/2015   HGB 10.2* 07/21/2015   HCT 30.0* 07/17/2015   MCV 85.4 08/01/2015   PLT 75* 07/08/2015   Lab Results  Component Value Date   CREATININE 2.10* 07/21/2015   BUN 34* 07/18/2015   NA 141 07/30/2015   K 4.1 07/24/2015   CL 89* 08/06/2015   CO2 33*  07/22/2015   CO2 33* 07/13/2015   Lab Results  Component Value Date   INR 1.05 07/16/2015   INR 1.43 07/09/2015   INR 1.60* 07/25/2015    07/2015 EKG: normal sinus rhythm, 1st degree AV block.   Anesthesia Physical  Anesthesia Plan  ASA: IV  Anesthesia Plan: General   Post-op Pain Management:    Induction: Inhalational  Airway Management Planned: Oral ETT  Additional Equipment: Arterial line, TEE, CVP and PA Cath  Intra-op Plan:   Post-operative Plan: Post-operative intubation/ventilation  Informed Consent:   Plan Discussed with:   Anesthesia Plan Comments:         Anesthesia Quick Evaluation

## 2015-07-22 NOTE — Progress Notes (Signed)
Subjective:  Relatively stable on pressors as well as milrinone and amiodarone- CRRT running well- removed 3 liters  Objective Vital signs in last 24 hours: Filed Vitals:   07/22/15 0630 07/22/15 0645 07/22/15 0700 07/22/15 0715  BP:      Pulse:      Temp: 98.2 F (36.8 C) 98.2 F (36.8 C) 98.2 F (36.8 C) 98.2 F (36.8 C)  TempSrc:      Resp: 16 35 26 31  Height:      Weight:      SpO2: 100% 100% 100% 100%   Weight change: -3.9 kg (-8 lb 9.6 oz)  Intake/Output Summary (Last 24 hours) at 07/22/15 0729 Last data filed at 07/22/15 0600  Gross per 24 hour  Intake 5048.9 ml  Output   8174 ml  Net -3125.1 ml    Assessment/ Plan: Pt is a 70 y.o. yo male with ESRD who was admitted on 06/18/2015 with CP and subsequent redo CABG that has been complicated by blood loss req transfusion and hemodynamic instability - balloon pump  Assessment/Plan: 1. CAD- complicated redo CABG- on pressors, milrinone, amiodarone- balloon pump in place- planning for sternal closure on 6/16 2. ESRD - normally TTS [facility]- has been off and on CRRT here- CRRT currently running via left groin HD cath (placed 6/8)- citrate protocol-  also with left forearm AVF - elytes stable on 4 K bath- Mag actually 2.5.  Will need to be taken off CRRT to go to OR tomorrow- may pause at that time 3. Anemia- complicating issue- has required transfusions- will add darbe 4. HTN/volume- tolerating removing 100-150 per hour- CVP coming down as well as O2 req- to continue  5. Inc LFTs- shock liver ? - trending better except bili  Joseph [name] A    Labs: Basic Metabolic Panel:  Recent Labs Lab 07/21/15 1630  07/22/15 0353 07/22/15 0402 07/22/15 0403  NA 133*  < > 141 138 138  K 4.3  < > 4.0 4.2 4.2  CL 101  < > 95* 97* 97*  CO2 22  --   --  26 27  GLUCOSE 169*  < > 99 106* 107*  BUN 47*  < > 35* 44* 45*  CREATININE 2.95*  < > 2.00* 2.59* 2.53*  CALCIUM 8.0*  --   --  9.0 8.9  PHOS 4.4  --   --  3.6  3.6  < > = values in this interval not displayed. Liver Function Tests:  Recent Labs Lab 07/20/15 0400  07/21/15 0358 07/21/15 1630 07/22/15 0402 07/22/15 0403  AST 936*  --  419*  --  230*  --   ALT 2098*  --  1509*  --  1105*  --   ALKPHOS 127*  --  141*  --  190*  --   BILITOT 8.2*  --  13.2*  --  20.0*  --   PROT 5.3*  --  5.2*  --  5.2*  --   ALBUMIN 2.1*  2.1*  < > 2.0* 1.9* 1.9* 1.8*  < > = values in this interval not displayed. No results for input(s): LIPASE, AMYLASE in the last 168 hours. No results for input(s): AMMONIA in the last 168 hours. CBC:  Recent Labs Lab 07/20/15 0400  07/20/15 1517  07/21/15 0358  07/21/15 1630  07/22/15 0349 07/22/15 0353 07/22/15 0402  WBC 17.5*  --  17.6*  --  17.9*  --  18.2*  --   --   --  19.0*  HGB 9.4*  < > 9.5*  < > 9.4*  < > 9.3*  < > 10.9* 11.6* 9.6*  HCT 28.2*  < > 28.2*  < > 27.7*  < > 27.5*  < > 32.0* 34.0* 28.2*  MCV 83.9  --  85.2  --  83.2  --  84.1  --   --   --  83.7  PLT 31*  --  45*  --  48*  --  51*  --   --   --  52*  < > = values in this interval not displayed. Cardiac Enzymes: No results for input(s): CKTOTAL, CKMB, CKMBINDEX, TROPONINI in the last 168 hours. CBG:  Recent Labs Lab 07/21/15 1856 07/21/15 2011 07/21/15 2131 07/21/15 2320 07/22/15 0014  GLUCAP 157* 158* 148* 160* 137*    Iron Studies: No results for input(s): IRON, TIBC, TRANSFERRIN, FERRITIN in the last 72 hours. Studies/Results: Dg Chest Port 1 View  07/21/2015  CLINICAL DATA:  Respiratory distress. EXAM: PORTABLE CHEST 1 VIEW COMPARISON:  07/20/2015. FINDINGS: Support tubes and lines are stable. BILATERAL chest tubes without pneumothorax. Slight improvement aeration with clearing of vascular congestion. Lower lobe opacities redemonstrated. IMPRESSION: Slight improvement aeration, some clearing of vascular congestion. Electronically Signed   By: Staci Righter M.D.   On: 07/21/2015 07:51   Dg Abd Portable 1v  07/21/2015  CLINICAL  DATA:  Ileus EXAM: PORTABLE ABDOMEN - 1 VIEW COMPARISON:  Portable exam 0707 hours compared to 07/20/2015 FINDINGS: Feeding tube coiled in stomach. Tip of nasogastric tube projects over gastric antrum. LEFT femoral line. Penile prosthesis with reservoir in RIGHT pelvis. Brachytherapy seed implants in prostate bed. Paucity of bowel gas. No obvious bowel dilatation identified. IMPRESSION: Paucity of bowel gas without gross bowel dilatation. Electronically Signed   By: Lavonia Dana M.D.   On: 07/21/2015 07:51   Dg Abd Portable 1v  07/20/2015  CLINICAL DATA:  Feeding tube placement EXAM: PORTABLE ABDOMEN - 1 VIEW COMPARISON:  07/20/2015 FINDINGS: There is paucity of bowel gas suspicious for ileus. Bilateral chest tube again noted. There is NG feeding tube with tip in distal stomach/pyloric region. IMPRESSION: NG feeding tube with tip in distal stomach/ pyloric region. Electronically Signed   By: Lahoma Crocker M.D.   On: 07/20/2015 11:42   Dg Abd Portable 1v  07/20/2015  CLINICAL DATA:  Feeding tube placement EXAM: PORTABLE ABDOMEN - 1 VIEW COMPARISON:  July 19, 2015 FINDINGS: Feeding tube tip is in the distal stomach. There is also a nasogastric tube with the tip and side-port in the mid the distal stomach. There is a paucity of gas. There is a femoral catheter on the left with the tip in the mid pelvic region, stable. IMPRESSION: Tube positions as described. There is a paucity of bowel gas. Paucity of gas may be normal but also may be indicative of a degree of enteritis or early ileus. Electronically Signed   By: Lowella Grip III M.D.   On: 07/20/2015 09:17   Medications: Infusions: . sodium chloride    . sodium chloride    . sodium chloride 20 mL/hr at 07/21/15 0800  . amiodarone 30 mg/hr (07/22/15 0627)  . calcium gluconate infusion for CRRT 20 g (07/22/15 0720)  . cisatracurium (NIMBEX) infusion Stopped (07/21/15 1500)  . EPINEPHrine 4 mg in dextrose 5% 250 mL infusion (16 mcg/mL) 15 mcg/min  (07/22/15 0720)  . fentaNYL infusion INTRAVENOUS 100 mcg/hr (07/22/15 0722)  . insulin (NOVOLIN-R) infusion 6.6 Units/hr (07/22/15 0718)  .  lactated ringers 20 mL/hr at 08/04/2015 2200  . lactated ringers 20 mL/hr at 08/03/2015 2200  . lidocaine Stopped (07/16/15 0800)  . midazolam (VERSED) infusion 2 mg/hr (07/22/15 0721)  . milrinone 0.125 mcg/kg/min (07/22/15 0600)  . norepinephrine (LEVOPHED) Adult infusion 3 mcg/min (07/22/15 0630)  . dialysis replacement fluid (prismasate) 300 mL/hr at 07/22/15 0726  . dialysate (PRISMASATE) 2,000 mL/hr at 07/22/15 0337  . sodium citrate 2 %/dextrose 2.5% solution 3000 mL 380 mL/hr at 07/22/15 0621  . vasopressin (PITRESSIN) infusion - *FOR SHOCK* Stopped (07/20/15 1700)    Scheduled Medications: . sodium chloride   Intravenous Once  . sodium chloride   Intravenous Once  . amiodarone  150 mg Intravenous Once  . antiseptic oral rinse  7 mL Mouth Rinse 10 times per day  . artificial tears  1 application Both Eyes [id number]  . bisacodyl  10 mg Oral Daily   Or  . bisacodyl  10 mg Rectal Daily  . chlorhexidine gluconate (SAGE KIT)  15 mL Mouth Rinse BID  . cisatracurium  0.05 mg/kg Intravenous Once  . diatrizoate meglumine-sodium  30 mL Oral Once  . docusate sodium  200 mg Oral Daily  . famotidine (PEPCID) IV  20 mg Intravenous Q24H  . feeding supplement (NEPRO CARB STEADY)  1,000 mL Per Tube Q24H  . feeding supplement (PRO-STAT SUGAR FREE 64)  60 mL Oral QID  . fentaNYL (SUBLIMAZE) injection  100 mcg Intravenous Once  . insulin regular  0-10 Units Intravenous TID WC  . magic mouthwash  10 mL Oral TID  . magnesium sulfate  4 g Intravenous Once  . metoCLOPramide (REGLAN) injection  10 mg Intravenous Q6H  . midazolam  2 mg Intravenous Once  . multivitamin  1 tablet Oral QHS  . pantoprazole  40 mg Oral Daily  . piperacillin-tazobactam (ZOSYN)  IV  3.375 g Intravenous Q6H  . sodium chloride flush  3 mL Intravenous Q12H  . vancomycin  1,000 mg Intravenous  Q24H    have reviewed scheduled and prn medications.  Physical Exam: General:sedated on vent Heart: RRR Lungs: CBS bilat Abdomen: distended Extremities: dependent pitting edema Dialysis Access: left groin vascath placed 6/8- also left forearm AVF     07/22/2015,7:29 AM  LOS: 16 days

## 2015-07-22 NOTE — Progress Notes (Signed)
PULMONARY / CRITICAL CARE MEDICINE   Name: Joseph [name] MRN: [id number] DOB: 02/12/1945    ADMISSION DATE:  07/01/2015 CONSULTATION DATE:  07/16/2015  REFERRING MD:  Nils Pyle  CHIEF COMPLAINT:  Post cardiothoracic surgery ventilator management  HISTORY OF PRESENT ILLNESS:   70 y/o male with ESRD and significant CAD (CABG 2001, DES 2016, EF 40-45%) was admitted on 5/29 with NSTEMI and had persistent chest pain during his hospitalization so he was taken for a redo CABG on 6/8 (SVG to diag and SVG to OM, IABP placement and CVL placement).  Post operatively he had excessive bleeding from all chest drains (pleural and mediastinal) so he was taken back to the OR on 6/9 for re-exploration.  PCCM was consulted for ventilator management.    SUBJECTIVE:  Remains critically ill, on vent, IABP, and CVVHD.  On multiple pressors including epi but slowly weaning.  Going back to the OR for closure supposedly tomorrow.  No further events overnight.  VITAL SIGNS: BP 137/47 mmHg  Pulse 43  Temp(Src) 98.4 F (36.9 C) (Core (Comment))  Resp 36  Ht 5\' 11"  (1.803 m)  Wt 108.7 kg (239 lb 10.2 oz)  BMI 33.44 kg/m2  SpO2 100%  HEMODYNAMICS: PAP: (28-40)/(19-31) 34/26 mmHg CVP:  [7 mmHg-19 mmHg] 15 mmHg CO:  [6.5 L/min-7.5 L/min] 7.5 L/min CI:  [3.1 L/min/m2-3.5 L/min/m2] 3.5 L/min/m2  VENTILATOR SETTINGS: Vent Mode:  [-] PRVC FiO2 (%):  [40 %-50 %] 40 % Set Rate:  [35 bmp] 35 bmp Vt Set:  [550 mL] 550 mL PEEP:  [8 cmH20] 8 cmH20 Plateau Pressure:  [23 cmH20-31 cmH20] 31 cmH20  INTAKE / OUTPUT: I/O last 3 completed shifts: In: 7577.9 [I.V.:5737.9; NG/GT:1180; IV Piggyback:660] Out: [id number] [Emesis/NG output:1710; [id number]; Chest Tube:220]  PHYSICAL EXAMINATION: General:  Critically ill on vent, CVVHD, jaundiced Neuro:  Heavily sedated / paralyzed on vent HEENT:  Diffuse facial edema improved, ETT in place Cardiovascular:  Balloon pump, median sternotomy dressing dry, atrial paced,  decreased drainage out of mediastinal and pleural tubes Lungs:  resps even non labored on vent, rales throughout, diminished bibasilar  Abdomen:  Distended, some focal superficial bruising, -bs  Musculoskeletal:  No bony abnormalities Skin:  Bruising all over extensor surfaces and belly, generalized 2++ edema  LABS:  BMET  Recent Labs Lab 07/21/15 1630  07/22/15 0402 07/22/15 0403 07/22/15 0759  NA 133*  < > 138 138 138  K 4.3  < > 4.2 4.2 4.3  CL 101  < > 97* 97* 98*  CO2 22  --  26 27  --   BUN 47*  < > 44* 45* 54*  CREATININE 2.95*  < > 2.59* 2.53* 3.00*  GLUCOSE 169*  < > 106* 107* 88  < > = values in this interval not displayed.  Electrolytes  Recent Labs Lab 07/20/15 0400  07/21/15 0358 07/21/15 1630 07/22/15 0402 07/22/15 0403  CALCIUM 7.6*  7.6*  < > 7.4* 8.0* 9.0 8.9  MG 2.5*  --  2.5*  --  2.5*  --   PHOS 3.9  3.9  < > 4.7* 4.4 3.6 3.6  < > = values in this interval not displayed.  CBC  Recent Labs Lab 07/21/15 0358  07/21/15 1630  07/22/15 0353 07/22/15 0402 07/22/15 0759  WBC 17.9*  --  18.2*  --   --  19.0*  --   HGB 9.4*  < > 9.3*  < > 11.6* 9.6* 21.8*  HCT 27.7*  < > 27.5*  < >  34.0* 28.2* 64.0*  PLT 48*  --  51*  --   --  52*  --   < > = values in this interval not displayed.  Coag's  Recent Labs Lab 07/20/2015 1929 07/17/2015 2050 07/16/15 0053  APTT 142* 94* 52*  INR 1.60* 1.43 1.05    Sepsis Markers  Recent Labs Lab 07/20/2015 1530 07/20/15 0400 07/21/15 0358  PROCALCITON 40.69 34.30 24.90    ABG  Recent Labs Lab 07/21/15 0415 07/21/15 1638 07/22/15 0325  PHART 7.429 7.438 7.531*  PCO2ART 35.6 34.7* 34.0*  PO2ART 89.8 98.0 84.0    Liver Enzymes  Recent Labs Lab 07/20/15 0400  07/21/15 0358 07/21/15 1630 07/22/15 0402 07/22/15 0403  AST 936*  --  419*  --  230*  --   ALT 2098*  --  1509*  --  1105*  --   ALKPHOS 127*  --  141*  --  190*  --   BILITOT 8.2*  --  13.2*  --  20.0*  --   ALBUMIN 2.1*  2.1*   < > 2.0* 1.9* 1.9* 1.8*  < > = values in this interval not displayed.  Cardiac Enzymes No results for input(s): TROPONINI, PROBNP in the last 168 hours.  Glucose  Recent Labs Lab 07/21/15 1752 07/21/15 1856 07/21/15 2011 07/21/15 2131 07/21/15 2320 07/22/15 0014  GLUCAP 172* 157* 158* 148* 160* 137*    Imaging Dg Chest Port 1 View  07/22/2015  CLINICAL DATA:  Re- do of CABG EXAM: PORTABLE CHEST 1 VIEW COMPARISON:  Portable chest of 07/21/2015 FINDINGS: Bilateral chest tubes are present. No pneumothorax is seen. There is bibasilar atelectasis right greater than left. Cardiomegaly is stable. The tip of the endotracheal tube is approximately 6.1 cm above the carina. Swan-Ganz catheter tip is in the region of the main pulmonary artery. NG tube extends below the hemidiaphragm. IMPRESSION: 1. Bilateral chest tubes.  No pneumothorax. 2. Bibasilar atelectasis. 3. Endotracheal tube tip approximately 6.1 cm above the carina. Electronically Signed   By: Ivar Drape M.D.   On: 07/22/2015 08:13   Dg Abd Portable 1v  07/22/2015  CLINICAL DATA:  Re- do of CABG, ileus, followup EXAM: PORTABLE ABDOMEN - 1 VIEW COMPARISON:  Portable abdomen of 07/21/2015 FINDINGS: As noted previously, there is a paucity of bowel gas. The entire abdomen was not included on this portable film. No definite ileus or obstruction is seen. Left femoral vascular catheters are noted. Feeding tube coils in the antrum of the stomach. IMPRESSION: 1. Paucity of bowel gas.  No definite ileus or obstruction. 2. Feeding tube coils in the antrum of the stomach. Electronically Signed   By: Ivar Drape M.D.   On: 07/22/2015 08:15     STUDIES:  6/8 TEE >  CULTURES: Sputum 6/10 >> neg  ANTIBIOTICS: 6/8 Vanc >> 6/10 Zosyn >>  SIGNIFICANT EVENTS: 6/8 Redo CABG 6/8 take back to OR emergently for diffuse bleeding  LINES/TUBES: Left forearm fistula 6/8 R IJ introducer >> 6/8 Multiple chest tubes (mediastinal and pleural bilateral)  >> 6/8 CVC left femoral vein >> 6/8 R radial arterial line >> 6/8 L IJ Gordy Councilman >>  DISCUSSION: 70 y/o male s/p redo CABG 6/8 complicated by significant post operative bleeding in the setting of thrombocytopenia brought back to the Surgical ICU on 6/9 with hypoxemic respiratory failure.  He has baseline ESRD and DM2.  ASSESSMENT / PLAN:  PULMONARY A: Acute respiratory failure with hypoxemia> Pulm edema, at risk for TRALI or  TACO Post operative pleural bleeding Respiratory acidosis - suspect mostly V/Q mismatch from cardiogenic shock, improved Small R Pleural Effusion P:   Volume removal as able per CVVHD Wean FiO2 for PaO2 >80 FiO2 40% and PEEP 8, will maintain. F/u ABG in am  Continue current vent settings. Intermittent CXR Chest tubes per thoracic  CARDIOVASCULAR A:  Severe Cardiogenic and hemorrhagic shock CAD s/p redo CABG P:  Vasopressors epi down to 15, norepi down to 1 and vaso off IABP per cardiology/TCTS. Pacing per TCTS. Tele monitoring.  RENAL A:   ESRD P:   Continue CVVHD per renal -- tolerating volume removal on pressors Renal following Replace electrolytes as indicated.  GASTROINTESTINAL A:   No acute issues P:   OG tube TF per nutrition. H2 blocker for stress ulcer prophylaxis  HEMATOLOGIC A:   Diffuse oozing due to coagulopathy from blood loss, thrombocytopenia P:  Transfuse per TCTS guidelines. Trend CBC, coags.  INFECTIOUS A:   ?HCAP - doubt, respiratory culture negative P:   Empiric abx vanc/zosyn. Monitor fever curve / WBC. D8/x abx, will continue for now.  ENDOCRINE A:   DM2   P:   Monitor glucose Insulin gtt per TCTS post op protocol  NEUROLOGIC A:   Sedation needs for vent synchrony P:   RASS goal: -5 Fentanyl gtt  Versed gtt   FAMILY  - Updates:  No family at bedside 6/15.   - Inter-disciplinary family meet or Palliative Care meeting due by:  day Eads, NP 07/22/2015  9:23 AM Pager: [phone]  [phone] or [phone]  Attending Note:  69 year old male with extensive PMH s/p CABG whose sternum is still open.  Remains intubated but with negative balance lungs are progressively clearer on exam.  I reviewed CXR myself, slowly clearing and ETT ok.  Discussed with PCCM-NP.  Will continue full vent support.  Anticipate will go to the OR for closure in AM then can begin weaning efforts.  In the meantime, continue abx, full vent support, pressor support and will re-evaluate post closure.  The patient is critically ill with multiple organ systems failure and requires high complexity decision making for assessment and support, frequent evaluation and titration of therapies, application of advanced monitoring technologies and extensive interpretation of multiple databases.   Critical Care Time devoted to patient care services described in this note is  35  Minutes. This time reflects time of care of this signee Dr [name]. This critical care time does not reflect procedure time, or teaching time or supervisory time of PA/NP/Med student/Med Resident etc but could involve care discussion time.  Rush Farmer, M.D. [facility] Pulmonary/Critical Care Medicine. Pager: [phone]. After hours pager: [phone].

## 2015-07-22 NOTE — Progress Notes (Signed)
CRITICAL VALUE ALERT  Critical value received:  Total Bilirubin 20  Date of notification:  07/22/15  Time of notification:  0501  Critical value read back: yes  Nurse who received alert:  A. Darcel Smalling RN  MD notified (1st page):  Will notify MD during AM rounds.

## 2015-07-22 NOTE — Progress Notes (Signed)
Nutrition Follow-up  DOCUMENTATION CODES:   Obesity unspecified  INTERVENTION:  Initiate Nepro formula at goal rate of 20 ml/h (480 ml per day) and Prostat 60 ml QID to provide 1664 kcals, 159 gm protein, 349 ml free water daily.   NUTRITION DIAGNOSIS:   Inadequate oral intake related to inability to eat as evidenced by NPO status.  Ongoing   GOAL:   Patient will meet greater than or equal to 90% of their needs  Meeting  MONITOR:   TF tolerance, Vent status, Labs, Weight trends, Skin, I & O's   ASSESSMENT:   Pt s/p redo CABG 6/8 complicated by significant post operative bleeding in the setting of thrombocytopenia brought back to the Surgical ICU on 6/9 with hypoxemic respiratory failure. He has baseline ESRD and DM2.  Pt remains on vent. Pt off paralytics. Temp (24hrs), Avg:98.1 F (36.7 C), Min:97 F (36.1 C), Max:99.9 F (37.7 C) CRRT OR tomorrow for sternal closure.  Chest tube output 170 ml on 6/14.  Gastric residual @ 600 ml; TF stopped; OGT to continuous suction.   Per RN, 1 L OGT suction this am. Per MD, TF to start today @ 12 pm today @ 10 ml/hr. Will not advance as pt will be going to OR tomorrow for sternal closure.  No abdominal distention noted by RN.  Weight trending down since 6/11. Per MD note, edema getting better.   Labs reviewed; Cl 98, creat 3.0, BUN 54, ca 1.08, K and phos WNL, mag 1.8. Meds reviewed; mag sulfate, MVI, reglan, calcium gluconate,    Diet Order:  Diet NPO time specified  Skin:  Wound (see comment) (Chest incisions )  Last BM:  unknown  Height:   Ht Readings from Last 1 Encounters:  06/26/2015 5\' 11"  (1.803 m)    Weight:   Wt Readings from Last 1 Encounters:  07/22/15 239 lb 10.2 oz (108.7 kg)    Ideal Body Weight:  78.1 kg  BMI:  Body mass index is 33.44 kg/(m^2).  Estimated Nutritional Needs:   Kcal:  [id number]   Protein:  150-160 g  Fluid:  per MD  EDUCATION NEEDS:   No education needs identified at  this time  Geoffery Lyons, [place] Dietetic Intern Pager [phone]

## 2015-07-22 NOTE — Plan of Care (Signed)
Pacer quit capturing AAI, reversed polarity, battery good. Tried to DDD pace 90 with V senistivity to 4, levophed increased to support BP. Dr. Darcey Nora paged

## 2015-07-22 NOTE — Plan of Care (Signed)
Unable to start tube feeding until ultrasound done

## 2015-07-22 NOTE — Progress Notes (Signed)
Dr. Prescott Gum made aware of gastric residual of 600cc. Order received to stop tube feed and to hook up OG tube to low continuous wall suction. Will continue to closely monitor. A. Darcel Smalling

## 2015-07-23 ENCOUNTER — Inpatient Hospital Stay (HOSPITAL_COMMUNITY): Payer: Medicare Other | Admitting: Anesthesiology

## 2015-07-23 ENCOUNTER — Inpatient Hospital Stay (HOSPITAL_COMMUNITY): Payer: Medicare Other

## 2015-07-23 ENCOUNTER — Encounter (HOSPITAL_COMMUNITY): Admission: EM | Disposition: E | Payer: Self-pay | Source: Home / Self Care | Attending: Cardiothoracic Surgery

## 2015-07-23 DIAGNOSIS — I9789 Other postprocedural complications and disorders of the circulatory system, not elsewhere classified: Secondary | ICD-10-CM

## 2015-07-23 DIAGNOSIS — I25110 Atherosclerotic heart disease of native coronary artery with unstable angina pectoris: Secondary | ICD-10-CM

## 2015-07-23 DIAGNOSIS — J9601 Acute respiratory failure with hypoxia: Secondary | ICD-10-CM | POA: Insufficient documentation

## 2015-07-23 HISTORY — PX: STERNAL CLOSURE: SHX6203

## 2015-07-23 HISTORY — PX: TEE WITHOUT CARDIOVERSION: SHX5443

## 2015-07-23 LAB — GLUCOSE, CAPILLARY
GLUCOSE-CAPILLARY: 104 mg/dL — AB (ref 65–99)
GLUCOSE-CAPILLARY: 103 mg/dL — AB (ref 65–99)
GLUCOSE-CAPILLARY: 100 mg/dL — AB (ref 65–99)
GLUCOSE-CAPILLARY: 89 mg/dL (ref 65–99)
GLUCOSE-CAPILLARY: 104 mg/dL — AB (ref 65–99)
GLUCOSE-CAPILLARY: 116 mg/dL — AB (ref 65–99)
GLUCOSE-CAPILLARY: 123 mg/dL — AB (ref 65–99)
GLUCOSE-CAPILLARY: 158 mg/dL — AB (ref 65–99)
GLUCOSE-CAPILLARY: 154 mg/dL — AB (ref 65–99)
Glucose-Capillary: 115 mg/dL — ABNORMAL HIGH (ref 65–99)
Glucose-Capillary: 104 mg/dL — ABNORMAL HIGH (ref 65–99)
Glucose-Capillary: 106 mg/dL — ABNORMAL HIGH (ref 65–99)
Glucose-Capillary: 88 mg/dL (ref 65–99)
Glucose-Capillary: 106 mg/dL — ABNORMAL HIGH (ref 65–99)
Glucose-Capillary: 97 mg/dL (ref 65–99)
Glucose-Capillary: 117 mg/dL — ABNORMAL HIGH (ref 65–99)
Glucose-Capillary: 161 mg/dL — ABNORMAL HIGH (ref 65–99)
Glucose-Capillary: 154 mg/dL — ABNORMAL HIGH (ref 65–99)
Glucose-Capillary: 151 mg/dL — ABNORMAL HIGH (ref 65–99)
Glucose-Capillary: 133 mg/dL — ABNORMAL HIGH (ref 65–99)
Glucose-Capillary: 117 mg/dL — ABNORMAL HIGH (ref 65–99)
Glucose-Capillary: 121 mg/dL — ABNORMAL HIGH (ref 65–99)
Glucose-Capillary: 147 mg/dL — ABNORMAL HIGH (ref 65–99)
Glucose-Capillary: 168 mg/dL — ABNORMAL HIGH (ref 65–99)

## 2015-07-23 LAB — TYPE AND SCREEN
ABO/RH(D): A POS
Antibody Screen: NEGATIVE
Unit division: 0
Unit division: 0
Unit division: 0
Unit division: 0

## 2015-07-23 LAB — POCT I-STAT, CHEM 8
BUN: 31 mg/dL — ABNORMAL HIGH (ref 6–20)
BUN: 32 mg/dL — ABNORMAL HIGH (ref 6–20)
BUN: 34 mg/dL — ABNORMAL HIGH (ref 6–20)
BUN: 34 mg/dL — ABNORMAL HIGH (ref 6–20)
BUN: 38 mg/dL — ABNORMAL HIGH (ref 6–20)
BUN: 40 mg/dL — ABNORMAL HIGH (ref 6–20)
BUN: 41 mg/dL — AB (ref 6–20)
BUN: 41 mg/dL — ABNORMAL HIGH (ref 6–20)
BUN: 48 mg/dL — AB (ref 6–20)
CALCIUM ION: 0.61 mmol/L — AB (ref 1.13–1.30)
CALCIUM ION: 0.62 mmol/L — AB (ref 1.13–1.30)
CALCIUM ION: 1.06 mmol/L — AB (ref 1.13–1.30)
CALCIUM ION: 1.07 mmol/L — AB (ref 1.13–1.30)
CHLORIDE: 91 mmol/L — AB (ref 101–111)
CHLORIDE: 91 mmol/L — AB (ref 101–111)
CHLORIDE: 93 mmol/L — AB (ref 101–111)
CREATININE: 2.1 mg/dL — AB (ref 0.61–1.24)
CREATININE: 2.7 mg/dL — AB (ref 0.61–1.24)
CREATININE: 2.8 mg/dL — AB (ref 0.61–1.24)
Calcium, Ion: 0.6 mmol/L — CL (ref 1.13–1.30)
Calcium, Ion: 1.12 mmol/L — ABNORMAL LOW (ref 1.13–1.30)
Calcium, Ion: 0.6 mmol/L — CL (ref 1.13–1.30)
Calcium, Ion: 1.08 mmol/L — ABNORMAL LOW (ref 1.13–1.30)
Calcium, Ion: 1.19 mmol/L (ref 1.13–1.30)
Chloride: 88 mmol/L — ABNORMAL LOW (ref 101–111)
Chloride: 89 mmol/L — ABNORMAL LOW (ref 101–111)
Chloride: 91 mmol/L — ABNORMAL LOW (ref 101–111)
Chloride: 89 mmol/L — ABNORMAL LOW (ref 101–111)
Chloride: 91 mmol/L — ABNORMAL LOW (ref 101–111)
Chloride: 92 mmol/L — ABNORMAL LOW (ref 101–111)
Creatinine, Ser: 2 mg/dL — ABNORMAL HIGH (ref 0.61–1.24)
Creatinine, Ser: 2 mg/dL — ABNORMAL HIGH (ref 0.61–1.24)
Creatinine, Ser: 2.1 mg/dL — ABNORMAL HIGH (ref 0.61–1.24)
Creatinine, Ser: 2.6 mg/dL — ABNORMAL HIGH (ref 0.61–1.24)
Creatinine, Ser: 2.6 mg/dL — ABNORMAL HIGH (ref 0.61–1.24)
Creatinine, Ser: 2.7 mg/dL — ABNORMAL HIGH (ref 0.61–1.24)
GLUCOSE: 111 mg/dL — AB (ref 65–99)
GLUCOSE: 114 mg/dL — AB (ref 65–99)
GLUCOSE: 93 mg/dL (ref 65–99)
GLUCOSE: 104 mg/dL — AB (ref 65–99)
GLUCOSE: 100 mg/dL — AB (ref 65–99)
Glucose, Bld: 189 mg/dL — ABNORMAL HIGH (ref 65–99)
Glucose, Bld: 84 mg/dL (ref 65–99)
Glucose, Bld: 98 mg/dL (ref 65–99)
Glucose, Bld: 124 mg/dL — ABNORMAL HIGH (ref 65–99)
HCT: 28 % — ABNORMAL LOW (ref 39.0–52.0)
HCT: 32 % — ABNORMAL LOW (ref 39.0–52.0)
HCT: 33 % — ABNORMAL LOW (ref 39.0–52.0)
HCT: 29 % — ABNORMAL LOW (ref 39.0–52.0)
HCT: 30 % — ABNORMAL LOW (ref 39.0–52.0)
HEMATOCRIT: 28 % — AB (ref 39.0–52.0)
HEMATOCRIT: 31 % — AB (ref 39.0–52.0)
HEMATOCRIT: 31 % — AB (ref 39.0–52.0)
HEMATOCRIT: 32 % — AB (ref 39.0–52.0)
HEMOGLOBIN: 10.2 g/dL — AB (ref 13.0–17.0)
HEMOGLOBIN: 10.5 g/dL — AB (ref 13.0–17.0)
HEMOGLOBIN: 10.5 g/dL — AB (ref 13.0–17.0)
HEMOGLOBIN: 10.9 g/dL — AB (ref 13.0–17.0)
HEMOGLOBIN: 10.9 g/dL — AB (ref 13.0–17.0)
HEMOGLOBIN: 11.2 g/dL — AB (ref 13.0–17.0)
HEMOGLOBIN: 9.5 g/dL — AB (ref 13.0–17.0)
HEMOGLOBIN: 9.9 g/dL — AB (ref 13.0–17.0)
Hemoglobin: 9.5 g/dL — ABNORMAL LOW (ref 13.0–17.0)
POTASSIUM: 4.2 mmol/L (ref 3.5–5.1)
POTASSIUM: 4.6 mmol/L (ref 3.5–5.1)
POTASSIUM: 4.1 mmol/L (ref 3.5–5.1)
POTASSIUM: 4.4 mmol/L (ref 3.5–5.1)
Potassium: 4.1 mmol/L (ref 3.5–5.1)
Potassium: 4.2 mmol/L (ref 3.5–5.1)
Potassium: 4.3 mmol/L (ref 3.5–5.1)
Potassium: 4.5 mmol/L (ref 3.5–5.1)
Potassium: 4.4 mmol/L (ref 3.5–5.1)
SODIUM: 137 mmol/L (ref 135–145)
SODIUM: 138 mmol/L (ref 135–145)
SODIUM: 138 mmol/L (ref 135–145)
SODIUM: 140 mmol/L (ref 135–145)
SODIUM: 141 mmol/L (ref 135–145)
Sodium: 137 mmol/L (ref 135–145)
Sodium: 138 mmol/L (ref 135–145)
Sodium: 141 mmol/L (ref 135–145)
Sodium: 137 mmol/L (ref 135–145)
TCO2: 33 mmol/L (ref 0–100)
TCO2: 34 mmol/L (ref 0–100)
TCO2: 34 mmol/L (ref 0–100)
TCO2: 35 mmol/L (ref 0–100)
TCO2: 35 mmol/L (ref 0–100)
TCO2: 35 mmol/L (ref 0–100)
TCO2: 35 mmol/L (ref 0–100)
TCO2: 35 mmol/L (ref 0–100)
TCO2: 39 mmol/L (ref 0–100)

## 2015-07-23 LAB — RENAL FUNCTION PANEL
Albumin: 2.2 g/dL — ABNORMAL LOW (ref 3.5–5.0)
Albumin: 2.1 g/dL — ABNORMAL LOW (ref 3.5–5.0)
Anion gap: 12 (ref 5–15)
Anion gap: 13 (ref 5–15)
BUN: 39 mg/dL — ABNORMAL HIGH (ref 6–20)
BUN: 53 mg/dL — ABNORMAL HIGH (ref 6–20)
CO2: 33 mmol/L — ABNORMAL HIGH (ref 22–32)
CO2: 31 mmol/L (ref 22–32)
Calcium: 10 mg/dL (ref 8.9–10.3)
Calcium: 9 mg/dL (ref 8.9–10.3)
Chloride: 94 mmol/L — ABNORMAL LOW (ref 101–111)
Chloride: 95 mmol/L — ABNORMAL LOW (ref 101–111)
Creatinine, Ser: 2.37 mg/dL — ABNORMAL HIGH (ref 0.61–1.24)
Creatinine, Ser: 3.18 mg/dL — ABNORMAL HIGH (ref 0.61–1.24)
GFR calc Af Amer: 31 mL/min — ABNORMAL LOW (ref >60)
GFR calc Af Amer: 22 mL/min — ABNORMAL LOW (ref >60)
GFR calc non Af Amer: 27 mL/min — ABNORMAL LOW (ref >60)
GFR calc non Af Amer: 19 mL/min — ABNORMAL LOW (ref >60)
Glucose, Bld: 99 mg/dL (ref 65–99)
Glucose, Bld: 125 mg/dL — ABNORMAL HIGH (ref 65–99)
Phosphorus: 4.1 mg/dL (ref 2.5–4.6)
Phosphorus: 4.8 mg/dL — ABNORMAL HIGH (ref 2.5–4.6)
Potassium: 4.4 mmol/L (ref 3.5–5.1)
Potassium: 4.5 mmol/L (ref 3.5–5.1)
Sodium: 139 mmol/L (ref 135–145)
Sodium: 139 mmol/L (ref 135–145)

## 2015-07-23 LAB — POCT I-STAT 3, ART BLOOD GAS (G3+)
ACID-BASE EXCESS: 14 mmol/L — AB (ref 0.0–2.0)
ACID-BASE EXCESS: 20 mmol/L — AB (ref 0.0–2.0)
ACID-BASE EXCESS: 13 mmol/L — AB (ref 0.0–2.0)
Acid-Base Excess: 14 mmol/L — ABNORMAL HIGH (ref 0.0–2.0)
BICARBONATE: 45.6 meq/L — AB (ref 20.0–24.0)
BICARBONATE: 37.7 meq/L — AB (ref 20.0–24.0)
Bicarbonate: 37.6 mEq/L — ABNORMAL HIGH (ref 20.0–24.0)
Bicarbonate: 36.7 mEq/L — ABNORMAL HIGH (ref 20.0–24.0)
O2 SAT: 96 %
O2 SAT: 99 %
O2 SAT: 96 %
O2 Saturation: 93 %
PCO2 ART: 40.9 mmHg (ref 35.0–45.0)
PH ART: 7.548 — AB (ref 7.350–7.450)
PH ART: 7.573 — AB (ref 7.350–7.450)
PO2 ART: 77 mmHg — AB (ref 80.0–100.0)
PO2 ART: 152 mmHg — AB (ref 80.0–100.0)
PO2 ART: 76 mmHg — AB (ref 80.0–100.0)
TCO2: 39 mmol/L (ref 0–100)
TCO2: 47 mmol/L (ref 0–100)
TCO2: 38 mmol/L (ref 0–100)
TCO2: 39 mmol/L (ref 0–100)
pCO2 arterial: 43.6 mmHg (ref 35.0–45.0)
pCO2 arterial: 58.8 mmHg (ref 35.0–45.0)
pCO2 arterial: 43.4 mmHg (ref 35.0–45.0)
pH, Arterial: 7.497 — ABNORMAL HIGH (ref 7.350–7.450)
pH, Arterial: 7.536 — ABNORMAL HIGH (ref 7.350–7.450)
pO2, Arterial: 59 mmHg — ABNORMAL LOW (ref 80.0–100.0)

## 2015-07-23 LAB — MAGNESIUM: Magnesium: 2.2 mg/dL (ref 1.7–2.4)

## 2015-07-23 LAB — PROTIME-INR
INR: 1.86 — ABNORMAL HIGH (ref 0.00–1.49)
Prothrombin Time: 21.3 seconds — ABNORMAL HIGH (ref 11.6–15.2)

## 2015-07-23 LAB — CBC
HCT: 26.4 % — ABNORMAL LOW (ref 39.0–52.0)
HCT: 24 % — ABNORMAL LOW (ref 39.0–52.0)
Hemoglobin: 8.8 g/dL — ABNORMAL LOW (ref 13.0–17.0)
Hemoglobin: 8 g/dL — ABNORMAL LOW (ref 13.0–17.0)
MCH: 28.5 pg (ref 26.0–34.0)
MCH: 29.3 pg (ref 26.0–34.0)
MCHC: 33.3 g/dL (ref 30.0–36.0)
MCHC: 33.3 g/dL (ref 30.0–36.0)
MCV: 85.4 fL (ref 78.0–100.0)
MCV: 87.9 fL (ref 78.0–100.0)
Platelets: 75 10*3/uL — ABNORMAL LOW (ref 150–400)
Platelets: 60 10*3/uL — ABNORMAL LOW (ref 150–400)
RBC: 3.09 MIL/uL — ABNORMAL LOW (ref 4.22–5.81)
RBC: 2.73 MIL/uL — ABNORMAL LOW (ref 4.22–5.81)
RDW: 18.2 % — ABNORMAL HIGH (ref 11.5–15.5)
RDW: 18.6 % — ABNORMAL HIGH (ref 11.5–15.5)
WBC: 18.9 10*3/uL — ABNORMAL HIGH (ref 4.0–10.5)
WBC: 19.3 10*3/uL — ABNORMAL HIGH (ref 4.0–10.5)

## 2015-07-23 LAB — COMPREHENSIVE METABOLIC PANEL
ALT: 605 U/L — ABNORMAL HIGH (ref 17–63)
AST: 136 U/L — ABNORMAL HIGH (ref 15–41)
Albumin: 2.1 g/dL — ABNORMAL LOW (ref 3.5–5.0)
Alkaline Phosphatase: 178 U/L — ABNORMAL HIGH (ref 38–126)
Anion gap: 14 (ref 5–15)
BUN: 39 mg/dL — ABNORMAL HIGH (ref 6–20)
CO2: 33 mmol/L — ABNORMAL HIGH (ref 22–32)
Calcium: 10 mg/dL (ref 8.9–10.3)
Chloride: 92 mmol/L — ABNORMAL LOW (ref 101–111)
Creatinine, Ser: 2.43 mg/dL — ABNORMAL HIGH (ref 0.61–1.24)
GFR calc Af Amer: 30 mL/min — ABNORMAL LOW (ref >60)
GFR calc non Af Amer: 26 mL/min — ABNORMAL LOW (ref >60)
Glucose, Bld: 99 mg/dL (ref 65–99)
Potassium: 4.4 mmol/L (ref 3.5–5.1)
Sodium: 139 mmol/L (ref 135–145)
Total Bilirubin: 27.5 mg/dL (ref 0.3–1.2)
Total Protein: 5.4 g/dL — ABNORMAL LOW (ref 6.5–8.1)

## 2015-07-23 LAB — CARBOXYHEMOGLOBIN
Carboxyhemoglobin: 1.9 % — ABNORMAL HIGH (ref 0.5–1.5)
Carboxyhemoglobin: 2.1 % — ABNORMAL HIGH (ref 0.5–1.5)
Methemoglobin: 0.7 % (ref 0.0–1.5)
Methemoglobin: 0.4 % (ref 0.0–1.5)
O2 Saturation: 50.8 %
O2 Saturation: 74.6 %
Total hemoglobin: 9.2 g/dL — ABNORMAL LOW (ref 13.5–18.0)
Total hemoglobin: 8.8 g/dL — ABNORMAL LOW (ref 13.5–18.0)

## 2015-07-23 LAB — POCT I-STAT 4, (NA,K, GLUC, HGB,HCT)
Glucose, Bld: 122 mg/dL — ABNORMAL HIGH (ref 65–99)
HCT: 26 % — ABNORMAL LOW (ref 39.0–52.0)
HEMOGLOBIN: 8.8 g/dL — AB (ref 13.0–17.0)
POTASSIUM: 4.4 mmol/L (ref 3.5–5.1)
SODIUM: 139 mmol/L (ref 135–145)

## 2015-07-23 LAB — PREPARE RBC (CROSSMATCH)

## 2015-07-23 LAB — APTT: aPTT: 34 seconds (ref 24–37)

## 2015-07-23 LAB — CALCIUM, IONIZED
Calcium, Ionized, Serum: 4.7 mg/dL (ref 4.5–5.6)
Calcium, Ionized, Serum: 4.9 mg/dL (ref 4.5–5.6)

## 2015-07-23 LAB — PHOSPHORUS: Phosphorus: 4 mg/dL (ref 2.5–4.6)

## 2015-07-23 LAB — PROCALCITONIN: PROCALCITONIN: 11.41 ng/mL

## 2015-07-23 SURGERY — CLOSURE, STERNUM
Anesthesia: General | Site: Chest

## 2015-07-23 MED ORDER — DEXTROSE 5 % IV SOLN
4000.0000 ug | INTRAVENOUS | Status: DC | PRN
Start: 2015-07-23 — End: 2015-07-23
  Administered 2015-07-23: 15 ug/min via INTRAVENOUS

## 2015-07-23 MED ORDER — FENTANYL CITRATE (PF) 250 MCG/5ML IJ SOLN
INTRAMUSCULAR | Status: AC
  Filled 2015-07-23: qty 5

## 2015-07-23 MED ORDER — SODIUM CHLORIDE 0.9 % IV SOLN
INTRAVENOUS | Status: DC | PRN
  Administered 2015-07-23: 08:00:00 via INTRAVENOUS

## 2015-07-23 MED ORDER — ACETAMINOPHEN 10 MG/ML IV SOLN
1000.0000 mg | Freq: Once | INTRAVENOUS | Status: AC
  Administered 2015-07-23: 1000 mg via INTRAVENOUS
  Filled 2015-07-23: qty 100

## 2015-07-23 MED ORDER — FENTANYL CITRATE (PF) 2500 MCG/50ML IJ SOLN
2500.0000 ug | INTRAMUSCULAR | Status: DC | PRN
  Administered 2015-07-23: 250 ug/h via INTRAVENOUS

## 2015-07-23 MED ORDER — NEPRO/CARBSTEADY PO LIQD
1000.0000 mL | ORAL | Status: DC
  Administered 2015-07-23: 1000 mL
  Filled 2015-07-23 (×2): qty 1000

## 2015-07-23 MED ORDER — ROCURONIUM BROMIDE 100 MG/10ML IV SOLN
INTRAVENOUS | Status: DC | PRN
  Administered 2015-07-23 (×3): 50 mg via INTRAVENOUS

## 2015-07-23 MED ORDER — HEMOSTATIC AGENTS (NO CHARGE) OPTIME
TOPICAL | Status: DC | PRN
  Administered 2015-07-23: 1 via TOPICAL

## 2015-07-23 MED ORDER — 0.9 % SODIUM CHLORIDE (POUR BTL) OPTIME
TOPICAL | Status: DC | PRN
  Administered 2015-07-23: 4000 mL

## 2015-07-23 MED ORDER — FLUCONAZOLE 100 MG PO TABS
100.0000 mg | ORAL_TABLET | Freq: Once | ORAL | Status: AC
  Administered 2015-07-23: 100 mg via ORAL
  Filled 2015-07-23: qty 1

## 2015-07-23 MED ORDER — SODIUM CHLORIDE 0.9 % IJ SOLN
OROMUCOSAL | Status: DC | PRN
  Administered 2015-07-23 (×2): 4 mL via TOPICAL

## 2015-07-23 MED ORDER — ALBUMIN HUMAN 5 % IV SOLN
INTRAVENOUS | Status: DC | PRN
  Administered 2015-07-23: 09:00:00 via INTRAVENOUS

## 2015-07-23 MED ORDER — PIPERACILLIN-TAZOBACTAM IN DEX 2-0.25 GM/50ML IV SOLN
2.2500 g | Freq: Three times a day (TID) | INTRAVENOUS | Status: DC
  Administered 2015-07-23 – 2015-07-24 (×3): 2.25 g via INTRAVENOUS
  Filled 2015-07-23 (×5): qty 50

## 2015-07-23 MED ORDER — ROCURONIUM BROMIDE 50 MG/5ML IV SOLN
INTRAVENOUS | Status: AC
  Filled 2015-07-23: qty 1

## 2015-07-23 MED ORDER — ALBUMIN HUMAN 5 % IV SOLN
12.5000 g | Freq: Once | INTRAVENOUS | Status: AC
  Administered 2015-07-23: 12.5 g via INTRAVENOUS

## 2015-07-23 MED ORDER — SODIUM CHLORIDE 0.9 % IR SOLN
Status: DC | PRN
  Administered 2015-07-23: 500 mL

## 2015-07-23 MED ORDER — ALBUMIN HUMAN 5 % IV SOLN
INTRAVENOUS | Status: AC
  Filled 2015-07-23: qty 250

## 2015-07-23 MED ORDER — VANCOMYCIN HCL 1000 MG IV SOLR
INTRAVENOUS | Status: DC | PRN
  Administered 2015-07-23: 1000 mL

## 2015-07-23 MED ORDER — SODIUM CHLORIDE 0.9 % IV SOLN
INTRAVENOUS | Status: DC
  Filled 2015-07-23: qty 1000

## 2015-07-23 MED ORDER — PROPOFOL 10 MG/ML IV BOLUS
INTRAVENOUS | Status: AC
  Filled 2015-07-23: qty 20

## 2015-07-23 MED ORDER — METHYLPREDNISOLONE SODIUM SUCC 125 MG IJ SOLR
80.0000 mg | Freq: Two times a day (BID) | INTRAMUSCULAR | Status: AC
  Administered 2015-07-23 – 2015-07-24 (×2): 80 mg via INTRAVENOUS
  Filled 2015-07-23 (×2): qty 2

## 2015-07-23 MED ORDER — ALTEPLASE 2 MG IJ SOLR
4.0000 mg | Freq: Once | INTRAMUSCULAR | Status: AC
  Administered 2015-07-23: 4 mg
  Filled 2015-07-23: qty 4

## 2015-07-23 MED ORDER — SODIUM CHLORIDE 0.9 % IV SOLN
Freq: Once | INTRAVENOUS | Status: AC
  Administered 2015-07-26: 11:00:00 via INTRAVENOUS

## 2015-07-23 MED ORDER — SODIUM CHLORIDE 0.9 % IV SOLN
50.0000 mg | INTRAVENOUS | Status: DC | PRN
  Administered 2015-07-23: 4 mg/h via INTRAVENOUS

## 2015-07-23 MED ORDER — MIDAZOLAM HCL 2 MG/2ML IJ SOLN
INTRAMUSCULAR | Status: AC
  Filled 2015-07-23: qty 2

## 2015-07-23 MED ORDER — MIDAZOLAM HCL 50 MG/10ML IJ SOLN
INTRAMUSCULAR | Status: AC
Start: 2015-07-23 — End: 2015-07-23
  Filled 2015-07-23: qty 1

## 2015-07-23 MED ORDER — NOREPINEPHRINE BITARTRATE 1 MG/ML IV SOLN
4000.0000 ug | INTRAVENOUS | Status: DC | PRN
  Administered 2015-07-23: 10 ug/min via INTRAVENOUS

## 2015-07-23 MED FILL — Sodium Chloride IV Soln 0.9%: INTRAVENOUS | Qty: 3000 | Status: AC

## 2015-07-23 MED FILL — Heparin Sodium (Porcine) Inj 1000 Unit/ML: INTRAMUSCULAR | Qty: 30 | Status: AC

## 2015-07-23 SURGICAL SUPPLY — 77 items
BAG DECANTER FOR FLEXI CONT (MISCELLANEOUS) ×8 IMPLANT
BENZOIN TINCTURE PRP APPL 2/3 (GAUZE/BANDAGES/DRESSINGS) ×4 IMPLANT
BLADE SAW SAG 29X58X.64 (BLADE) IMPLANT
CABLE PACING FASLOC BIEGE (MISCELLANEOUS) ×4 IMPLANT
CABLE PACING FASLOC BLUE (MISCELLANEOUS) ×4 IMPLANT
CANISTER SUCTION 2500CC (MISCELLANEOUS) ×4 IMPLANT
CATH THORACIC 28FR RT ANG (CATHETERS) IMPLANT
CATH THORACIC 36FR (CATHETERS) IMPLANT
CATH THORACIC 36FR RT ANG (CATHETERS) ×8 IMPLANT
CONN ST 1/4X3/8  BEN (MISCELLANEOUS) ×2
CONN ST 1/4X3/8 BEN (MISCELLANEOUS) ×2 IMPLANT
CONN Y 3/8X3/8X3/8  BEN (MISCELLANEOUS) ×4
CONN Y 3/8X3/8X3/8 BEN (MISCELLANEOUS) ×4 IMPLANT
CONT SPEC 4OZ CLIKSEAL STRL BL (MISCELLANEOUS) IMPLANT
DRAIN CHANNEL 28F RND 3/8 FF (WOUND CARE) ×4 IMPLANT
DRAPE LAPAROSCOPIC ABDOMINAL (DRAPES) ×4 IMPLANT
DRAPE SLUSH/WARMER DISC (DRAPES) ×4 IMPLANT
DRSG AQUACEL AG ADV 3.5X14 (GAUZE/BANDAGES/DRESSINGS) ×4 IMPLANT
DRSG PAD ABDOMINAL 8X10 ST (GAUZE/BANDAGES/DRESSINGS) IMPLANT
ELECT REM PT RETURN 9FT ADLT (ELECTROSURGICAL) ×8
ELECTRODE REM PT RTRN 9FT ADLT (ELECTROSURGICAL) ×4 IMPLANT
FELT TEFLON 1X6 (MISCELLANEOUS) ×4 IMPLANT
GAUZE SPONGE 4X4 12PLY STRL (GAUZE/BANDAGES/DRESSINGS) ×4 IMPLANT
GAUZE XEROFORM 5X9 LF (GAUZE/BANDAGES/DRESSINGS) ×4 IMPLANT
GLOVE BIO SURGEON STRL SZ 6 (GLOVE) ×4 IMPLANT
GLOVE BIO SURGEON STRL SZ 6.5 (GLOVE) ×3 IMPLANT
GLOVE BIO SURGEON STRL SZ7.5 (GLOVE) ×16 IMPLANT
GLOVE BIO SURGEONS STRL SZ 6.5 (GLOVE) ×1
GLOVE BIOGEL PI IND STRL 6 (GLOVE) ×2 IMPLANT
GLOVE BIOGEL PI IND STRL 6.5 (GLOVE) ×4 IMPLANT
GLOVE BIOGEL PI IND STRL 7.0 (GLOVE) ×2 IMPLANT
GLOVE BIOGEL PI INDICATOR 6 (GLOVE) ×2
GLOVE BIOGEL PI INDICATOR 6.5 (GLOVE) ×4
GLOVE BIOGEL PI INDICATOR 7.0 (GLOVE) ×2
GOWN STRL REUS W/ TWL LRG LVL3 (GOWN DISPOSABLE) ×10 IMPLANT
GOWN STRL REUS W/TWL LRG LVL3 (GOWN DISPOSABLE) ×10
HEMOSTAT POWDER SURGIFOAM 1G (HEMOSTASIS) ×8 IMPLANT
HEMOSTAT SURGICEL 2X14 (HEMOSTASIS) IMPLANT
KIT BASIN OR (CUSTOM PROCEDURE TRAY) ×4 IMPLANT
KIT REMOVER STAPLE SKIN (MISCELLANEOUS) ×8 IMPLANT
KIT ROOM TURNOVER OR (KITS) ×4 IMPLANT
KIT SUCTION CATH 14FR (SUCTIONS) IMPLANT
NS IRRIG 1000ML POUR BTL (IV SOLUTION) ×16 IMPLANT
PACK CHEST (CUSTOM PROCEDURE TRAY) ×4 IMPLANT
PAD ARMBOARD 7.5X6 YLW CONV (MISCELLANEOUS) ×8 IMPLANT
PIN SAFETY STERILE (MISCELLANEOUS) IMPLANT
RUBBERBAND STERILE (MISCELLANEOUS) IMPLANT
SPONGE LAP 18X18 X RAY DECT (DISPOSABLE) IMPLANT
STAPLER VISISTAT 35W (STAPLE) ×4 IMPLANT
STRAP MONTGOMERY 1.25X11-1/8 (MISCELLANEOUS) IMPLANT
SURGIFLO W/THROMBIN 8M KIT (HEMOSTASIS) ×4 IMPLANT
SUT ETHILON 3 0 FSL (SUTURE) ×12 IMPLANT
SUT PROLENE 3 0 SH DA (SUTURE) ×4 IMPLANT
SUT PROLENE 4 0 RB 1 (SUTURE) ×2
SUT PROLENE 4-0 RB1 .5 CRCL 36 (SUTURE) ×2 IMPLANT
SUT PROLENE 5 0 C 1 36 (SUTURE) ×8 IMPLANT
SUT PROLENE 6 0 C 1 30 (SUTURE) ×4 IMPLANT
SUT SILK  1 MH (SUTURE) ×6
SUT SILK 1 MH (SUTURE) ×6 IMPLANT
SUT SILK 2 0 SH CR/8 (SUTURE) ×4 IMPLANT
SUT STEEL 6MS V (SUTURE) IMPLANT
SUT STEEL STERNAL CCS#1 18IN (SUTURE) IMPLANT
SUT STEEL SZ 6 DBL 3X14 BALL (SUTURE) IMPLANT
SUT STERNA BAND STERNOTOMY (SUTURE) IMPLANT
SUT VIC AB 1 CTX 18 (SUTURE) IMPLANT
SUT VIC AB 1 CTX 36 (SUTURE) ×6
SUT VIC AB 1 CTX36XBRD ANBCTR (SUTURE) ×6 IMPLANT
SUT VIC AB 2-0 CTX 27 (SUTURE) ×8 IMPLANT
SUT VIC AB 3-0 SH 18 (SUTURE) ×4 IMPLANT
SUT VIC AB 3-0 X1 27 (SUTURE) ×8 IMPLANT
SWAB COLLECTION DEVICE MRSA (MISCELLANEOUS) ×4 IMPLANT
SYRINGE 10CC LL (SYRINGE) ×4 IMPLANT
SYSTEM SAHARA CHEST DRAIN ATS (WOUND CARE) ×4 IMPLANT
TOWEL OR 17X24 6PK STRL BLUE (TOWEL DISPOSABLE) ×4 IMPLANT
TOWEL OR 17X26 10 PK STRL BLUE (TOWEL DISPOSABLE) ×4 IMPLANT
TUBE ANAEROBIC SPECIMEN COL (MISCELLANEOUS) IMPLANT
WATER STERILE IRR 1000ML POUR (IV SOLUTION) ×8 IMPLANT

## 2015-07-23 NOTE — Progress Notes (Signed)
Pharmacy Antibiotic Note  Joseph [name] is a 70 y.o. male s/p redo CABG on 6/9 who continues on vancomycin and zosyn for open sternum. Now s/p sternal closure today. CRRT was stopped for the OR and will remain on hold until tomorrow.    Plan: - Hold vancomycin for now - f/u AM renal plan for further doses - Change zosyn to 2.25gm IV Q8H while CRRT is on hold - F/u renal plans, C&S, clinical status and trough at SS   Height: 5\' 11"  (180.3 cm) Weight: 233 lb 14.5 oz (106.1 kg) IBW/kg (Calculated) : 75.3  Temp (24hrs), Avg:99.8 F (37.7 C), Min:98.1 F (36.7 C), Max:101.7 F (38.7 C)   Recent Labs Lab 07/20/15 0400  07/21/15 0358  07/21/15 0920  07/21/15 1630  07/22/15 0402  08/03/2015 0355 07/09/2015 0359 07/29/2015 0405 07/09/2015 0611 07/22/2015 0615 07/22/2015 1100  WBC 17.5*  < > 17.9*  --   --   --  18.2*  --  19.0*  --   --   --  18.9*  --   --  19.3*  CREATININE 2.73*  2.83*  < > 2.75*  < >  --   < > 2.95*  < > 2.59*  < > 2.60* 2.10* 2.43*  2.37* 2.70* 2.10*  --   VANCOTROUGH  --   --   --   --  25*  --   --   --   --   --   --   --   --   --   --   --   VANCORANDOM 24  --  28  --   --   --   --   --   --   --   --   --   --   --   --   --   < > = values in this interval not displayed.  Estimated Creatinine Clearance: 42.3 mL/min (by C-G formula based on Cr of 2.1).    Allergies  Allergen Reactions  . Sulfa Antibiotics Hives and Itching  . Morphine And Related     Makes patient feel weird    Antimicrobials this admission: 6/8 Vanc >> (Rx dosing 6/11) 6/9 Zosyn>>  Dose adjustments this admission: 6/10 AM VR (CVTS ordered): 26 (only received 2 doses, CRRT off x 10 hours when level drawn) 6/11 AM VR (CVTS): 14 (no dose 6/10) - start 1250mg  q24 6/14 VT = 25 on 1250mg  q24 with CRRT (drawn ~ 1 hour early)  Microbiology results: 6/10 TA: negative, final 6/7 surg mrsa pcr: neg  Thank you for allowing pharmacy to be a part of this patient's care.  Nalaya Wojdyla, Rande Lawman 07/26/2015 12:27 PM

## 2015-07-23 NOTE — Brief Op Note (Addendum)
06/07/2015 - 07/29/2015  10:58 AM  PATIENT:  Joseph [name]  70 y.o. male  PRE-OPERATIVE DIAGNOSIS:  OPEN CHEST POST REDO CABG  POST-OPERATIVE DIAGNOSIS:  OPEN CHEST POST REDO CABG  PROCEDURE:  Procedure(s): STERNAL CLOSURE WITH PUMP STANDBY (N/A) TRANSESOPHAGEAL ECHOCARDIOGRAM (TEE) (N/A) Placement of central line  Patient tolerated sternal closure w/o hemodynamic effect TEE shows improved ventricular fx  SURGEON:  Surgeon(s) and Role:    * Ivin Poot, MD - Primary  PHYSICIAN ASSISTANT: 0  ASSISTANTS: none   ANESTHESIA:   general  EBL:  Total I/O In: 1383 [I.V.:600; Blood:283; IV Piggyback:500] Out: 100 [Blood:100]  BLOOD ADMINISTERED:none  DRAINS: 3 Chest Tube(s) in the mediastinum, L and R pleural spaces   LOCAL MEDICATIONS USED:  NONE  SPECIMEN:  No Specimen  DISPOSITION OF SPECIMEN:  N/A  COUNTS:  YES  TOURNIQUET:  * No tourniquets in log *  DICTATION: .Dragon Dictation  PLAN OF CARE: return to SICU  PATIENT DISPOSITION:  ICU - intubated and hemodynamically stable.   Delay start of Pharmacological VTE agent (>24hrs) due to surgical blood loss or risk of bleeding: yes

## 2015-07-23 NOTE — Plan of Care (Signed)
Dr. Darcey Nora made aware of temp, orders received, to draw blood culture from new central line

## 2015-07-23 NOTE — Transfer of Care (Signed)
Immediate Anesthesia Transfer of Care Note  Patient: Joseph [name]  Procedure(s) Performed: Procedure(s): STERNAL CLOSURE WITH PUMP STANDBY (N/A) TRANSESOPHAGEAL ECHOCARDIOGRAM (TEE) (N/A)  Patient Location: SICU  Anesthesia Type:General  Level of Consciousness: Patient remains intubated per anesthesia plan  Airway & Oxygen Therapy: Patient remains intubated per anesthesia plan  Post-op Assessment: Report given to RN and Post -op Vital signs reviewed and stable  Post vital signs: Reviewed and stable  Last Vitals:  Filed Vitals:   07/21/2015 0730 08/03/2015 0745  BP:    Pulse: 52 103  Temp: 38.6 C   Resp: 35 24    Last Pain:  Filed Vitals:   07/25/2015 0750  PainSc: Asleep      Patients Stated Pain Goal: 0 ([id number])  Complications: No apparent anesthesia complications

## 2015-07-23 NOTE — Progress Notes (Addendum)
The patient was examined and preop studies reviewed. There has been no change from the prior exam and the patient is ready for surgery.  plan sternal closure on Joseph [name]  I discussed the patients current condition including pulmonary,hepatic acute on chronic failure , potential need for trach, plan for sternal closure,plan for IABP wean and guarded overall prognosis but still  with potential for recovery at this point with the family at the bedside

## 2015-07-23 NOTE — Progress Notes (Signed)
  Echocardiogram 2D Echocardiogram has been performed.  Darlina Sicilian M 07/14/2015, 8:58 AM

## 2015-07-23 NOTE — Progress Notes (Signed)
PULMONARY / CRITICAL CARE MEDICINE   Name: Joseph [name] MRN: [id number] DOB: Jan 07, 1946    ADMISSION DATE:  06/28/2015 CONSULTATION DATE:  07/16/2015  REFERRING MD:  Nils Pyle  CHIEF COMPLAINT:  Post cardiothoracic surgery ventilator management  HISTORY OF PRESENT ILLNESS:   70 y/o male with ESRD and significant CAD (CABG 2001, DES 2016, EF 40-45%) was admitted on 5/29 with NSTEMI and had persistent chest pain during his hospitalization so he was taken for a redo CABG on 6/8 (SVG to diag and SVG to OM, IABP placement and CVL placement).  Post operatively he had excessive bleeding from all chest drains (pleural and mediastinal) so he was taken back to the OR on 6/9 for re-exploration.  PCCM was consulted for ventilator management.    SUBJECTIVE:  Remains critically ill, on vent, IABP, and CVVHD.  On multiple pressors including epi but slowly weaning.  Back to OR this am for sternum closure. No issues overnight.  Tolerated surgery this am.   VITAL SIGNS: BP 94/44 mmHg  Pulse 103  Temp(Src) 101.5 F (38.6 C) (Core (Comment))  Resp 24  Ht 5\' 11"  (1.803 m)  Wt 106.1 kg (233 lb 14.5 oz)  BMI 32.64 kg/m2  SpO2 100%  HEMODYNAMICS: PAP: (27-46)/(16-31) 27/17 mmHg CVP:  [10 mmHg-27 mmHg] 14 mmHg CO:  [6.3 L/min-10 L/min] 9.2 L/min CI:  [3 L/min/m2-4.7 L/min/m2] 4.3 L/min/m2  VENTILATOR SETTINGS: Vent Mode:  [-] PRVC FiO2 (%):  [40 %] 40 % Set Rate:  [35 bmp] 35 bmp Vt Set:  [550 mL] 550 mL PEEP:  [8 cmH20] 8 cmH20 Plateau Pressure:  [22 cmH20-30 cmH20] 25 cmH20  INTAKE / OUTPUT: I/O last 3 completed shifts: In: 9138.3 [I.V.:7279.3; Blood:489; NG/GT:670; IV Piggyback:700] Out: [id number] [Emesis/NG output:1760; AS:1085572; Chest Tube:200]  PHYSICAL EXAMINATION: General:  Critically ill on vent, CVVHD, jaundiced Neuro:  Heavily sedated / paralyzed on vent HEENT:  Diffuse facial edema improved, ETT in place Cardiovascular:  Balloon pump, median sternotomy dressing dry, atrial  paced, decreased drainage out of mediastinal and pleural tubes Lungs:  resps even non labored on vent, rales throughout, diminished bibasilar  Abdomen:  Distended, some focal superficial bruising, -bs  Musculoskeletal:  No bony abnormalities Skin:  Bruising all over extensor surfaces and belly, generalized 2++ edema  LABS:  BMET  Recent Labs Lab 07/22/15 0403  07/22/15 1515  07/24/2015 0405 07/11/2015 0611 08/02/2015 0615  NA 138  < > 139  < > 139  139 138 141  K 4.2  < > 4.1  < > 4.4  4.4 4.4 4.1  CL 97*  < > 96*  < > 92*  94* 91* 89*  CO2 27  --  30  --  33*  33*  --   --   BUN 45*  < > 42*  < > 39*  39* 41* 34*  CREATININE 2.53*  < > 2.49*  < > 2.43*  2.37* 2.70* 2.10*  GLUCOSE 107*  < > 107*  < > 99  99 104* 124*  < > = values in this interval not displayed.  Electrolytes  Recent Labs Lab 07/21/15 0358  07/22/15 0402 07/22/15 0403 07/22/15 1515 07/22/2015 0405  CALCIUM 7.4*  < > 9.0 8.9 9.0 10.0  10.0  MG 2.5*  --  2.5*  --   --  2.2  PHOS 4.7*  < > 3.6 3.6 4.4 4.0  4.1  < > = values in this interval not displayed.  CBC  Recent Labs  Lab 07/21/15 1630  07/22/15 0402  07/26/2015 0405 08/01/2015 0611 07/22/2015 0615  WBC 18.2*  --  19.0*  --  18.9*  --   --   HGB 9.3*  < > 9.6*  < > 8.8* 9.9* 10.2*  HCT 27.5*  < > 28.2*  < > 26.4* 29.0* 30.0*  PLT 51*  --  52*  --  75*  --   --   < > = values in this interval not displayed.  Coag's No results for input(s): APTT, INR in the last 168 hours.  Sepsis Markers  Recent Labs Lab 07/28/2015 1530 07/20/15 0400 07/21/15 0358  PROCALCITON 40.69 34.30 24.90    ABG  Recent Labs Lab 07/22/15 0325 07/22/15 1514 07/09/2015 0410  PHART 7.531* 7.508* 7.548*  PCO2ART 34.0* 44.0 43.6  PO2ART 84.0 90.0 77.0*    Liver Enzymes  Recent Labs Lab 07/21/15 0358  07/22/15 0402 07/22/15 0403 07/22/15 0938 07/22/15 1515 08/06/2015 0405  AST 419*  --  230*  --   --   --  136*  ALT 1509*  --  1105*  --   --   --  605*   ALKPHOS 141*  --  190*  --   --   --  178*  BILITOT 13.2*  --  20.0*  --  21.2*  --  27.5*  ALBUMIN 2.0*  < > 1.9* 1.8*  --  2.2* 2.1*  2.2*  < > = values in this interval not displayed.  Cardiac Enzymes No results for input(s): TROPONINI, PROBNP in the last 168 hours.  Glucose  Recent Labs Lab 08/03/2015 0101 07/13/2015 0204 08/03/2015 0308 08/03/2015 0352 08/03/2015 0509 07/08/2015 0609  GLUCAP 89 88 106* 97 104* 116*    Imaging Dg Chest Port 1 View  07/17/2015  CLINICAL DATA:  70 year old male status post redo CABG. Initial encounter. EXAM: PORTABLE CHEST 1 VIEW COMPARISON:  07/22/2015 and earlier. FINDINGS: Portable AP semi upright view at 0617 hours. Endotracheal tube remains in place, tip at the level the clavicles. NG type tube and probably also a separate feeding tube remain in place coursing to the abdomen, tips not included. Bilateral chest tubes remain. Mediastinal tube remains. Left IJ approach Swan-Ganz catheter still in place, tip at the level of the main pulmonary artery. Intra aortic balloon pump in place, marker projects just below the aortic arch level. Stable cardiac size and mediastinal contours. No pneumothorax or pulmonary edema. Stable patchy opacity at the right lung base. No definite pleural effusion. No other confluent opacity. IMPRESSION: 1.  Stable lines and tubes. 2. Stable ventilation.  No pneumothorax or pulmonary edema. Electronically Signed   By: Genevie Ann M.D.   On: 07/10/2015 08:03   US Abdomen Limited Ruq  07/22/2015  CLINICAL DATA:  Jaundice. EXAM: US ABDOMEN LIMITED - RIGHT UPPER QUADRANT COMPARISON:  Abdominal radiograph 07/22/2015 FINDINGS: Gallbladder: There is gallbladder wall thickening which measures up to 4.5 mm. No calculi year are seen. Common bile duct: Diameter: 5.4 mm Liver: No focal lesion identified. Within normal limits in parenchymal echogenicity. Normal direction of flow of the portal vein noted. Small amount of ascites is noted. There is a small  right pleural effusion. IMPRESSION: No evidence of cholelithiasis, given the technical limitations of this exam. Diffuse gallbladder wall thickening, which measures up to 4.5 mm. Given the presence of ascites this may represent reactive gallbladder wall thickening. Acalculous cholecystitis is also a diagnostic consideration in the appropriate clinical setting. Electronically Signed   By:  Fidela Salisbury M.D.   On: 07/22/2015 18:51     STUDIES:  6/8 TEE >  CULTURES: Sputum 6/10 >> neg  ANTIBIOTICS: 6/8 Vanc >> 6/10 Zosyn >>  SIGNIFICANT EVENTS: 6/8 Redo CABG 6/8 take back to OR emergently for diffuse bleeding 6/16 back to OR to close sternum  LINES/TUBES: Left forearm fistula 6/8 R IJ introducer >> 6/8 Multiple chest tubes (mediastinal and pleural bilateral) >> 6/8 CVC left femoral vein >> 6/8 R radial arterial line >> 6/8 L IJ Gordy Councilman >>  DISCUSSION: 70 y/o male s/p redo CABG 6/8 complicated by significant post operative bleeding in the setting of thrombocytopenia brought back to the Surgical ICU on 6/9 with hypoxemic respiratory failure.  He has baseline ESRD and DM2.  ASSESSMENT / PLAN:  PULMONARY A: Acute hypoxemic respiratory failure > Pulm edema, at risk for TRALI or TACO Post operative pleural bleeding Respiratory acidosis - suspect mostly V/Q mismatch from cardiogenic shock, improved Small R Pleural Effusion P:   Volume removal as able per CVVHD FiO2 80% pot op and PEEP 8 > try to cut down Fio2.  Intermittent CXR Chest tubes per thoracic No weaning today  Possible trache next week. Need to ask who will do it TCVS or PCCM.   CARDIOVASCULAR A:  Severe Cardiogenic and hemorrhagic shock CAD s/p redo CABG P:  Vasopressors > cont to wean off if able. On epi and levophed drips.  IABP per cardiology/TCTS. Pacing per TCTS. Tele monitoring.  RENAL A:   ESRD CKD V P:   Continue CVVHD per renal -- tolerating volume removal on pressors. Resume cvvh in am.   Renal following Replace electrolytes as indicated.  GASTROINTESTINAL A:   No acute issues P:   OG tube TF per nutrition. H2 blocker for stress ulcer prophylaxis  HEMATOLOGIC A:   Diffuse oozing due to coagulopathy from blood loss, thrombocytopenia P:  Transfuse per TCTS guidelines. Trend CBC, coags.  INFECTIOUS A:   ?HCAP - doubt, respiratory culture negative P:   Empiric abx vanc/zosyn. Monitor fever curve / WBC. D8/x abx, will continue for now. PCT elevated on 6/14. Will rpt today and see trend.   ENDOCRINE A:   DM2   P:   Monitor glucose Insulin gtt per TCTS post op protocol  NEUROLOGIC A:   Sedation needs for vent synchrony P:   RASS goal: -5 Fentanyl gtt  Versed gtt   FAMILY  - Updates:  Daughter updated at bedside.   - Inter-disciplinary family meet or Palliative Care meeting due by:  day 7   Critical Care Time devoted to patient care services described in this note is  35  Minutes.   Monica Becton, MD 07/10/2015, 1:29 PM [facility] Pager [phone] After 3 pm or if no answer, call [phone]

## 2015-07-23 NOTE — Anesthesia Postprocedure Evaluation (Signed)
Anesthesia Post Note  Patient: Joseph [name]  Procedure(s) Performed: Procedure(s) (LRB): STERNAL CLOSURE WITH PUMP STANDBY (N/A) TRANSESOPHAGEAL ECHOCARDIOGRAM (TEE) (N/A)  Patient location during evaluation: SICU Anesthesia Type: General Level of consciousness: sedated Pain management: pain level controlled Vital Signs Assessment: post-procedure vital signs reviewed and stable Respiratory status: patient remains intubated per anesthesia plan Cardiovascular status: stable Anesthetic complications: no    Last Vitals:  Filed Vitals:   07/20/2015 1330 07/28/2015 1345  BP:    Pulse: 99 99  Temp: 38.3 C 38.2 C  Resp: 35 35    Last Pain:  Filed Vitals:   07/28/2015 1348  PainSc: Asleep                 Joseph [name]

## 2015-07-23 NOTE — Progress Notes (Signed)
Pt transported on vent at this time, no complications.

## 2015-07-23 NOTE — OR Nursing (Signed)
Twenty minute call to SICU charge nurse at 1009. Spoke to [employer].

## 2015-07-23 NOTE — Progress Notes (Signed)
Subjective:  Going to OR for sternal wound closure this AM- CRRT removed 3 liters overnight- BP very low Objective Vital signs in last 24 hours: Filed Vitals:   07/31/2015 0630 07/18/2015 0645 07/18/2015 0700 07/24/2015 0715  BP:      Pulse: 103 103 102 51  Temp: 100.9 F (38.3 C) 100.8 F (38.2 C) 101.1 F (38.4 C) 101.3 F (38.5 C)  TempSrc:      Resp: 39 34 28 32  Height:      Weight:      SpO2: 98% 99% 96% 96%   Weight change: -2.6 kg (-5 lb 11.7 oz)  Intake/Output Summary (Last 24 hours) at 07/17/2015 0743 Last data filed at 07/21/2015 0700  Gross per 24 hour  Intake 6824.35 ml  Output   9616 ml  Net -2791.65 ml    Assessment/ Plan: Pt is a 70 y.o. yo male with ESRD who was admitted on 07/04/2015 with CP and subsequent redo CABG that has been complicated by blood loss req transfusion and hemodynamic instability - balloon pump  Assessment/Plan: 1. CAD- complicated redo CABG- on pressors, milrinone, amiodarone- balloon pump - planning for sternal closure today 2. ESRD - normally TTS [facility]- has been off and on CRRT here- CRRT currently running via left groin HD cath (placed 6/8)- citrate protocol-  also with left forearm AVF - elytes stable on 4 K bath- Mag actually 2.2.   off CRRT to go to OR today- will hold overnight 3. Anemia- complicating issue- has required transfusions- have added darbe- trending up 4. HTN/volume- was tolerating removing 100-150 per hour- CVP now around 10 and is on 40 %fio2- so feel comfortable holding CRRT overnight  5. Inc LFTs- shock liver ? - trending better except bili  Joseph [name] A    Labs: Basic Metabolic Panel:  Recent Labs Lab 07/22/15 0403  07/22/15 1515  07/15/2015 0405 07/11/2015 0611 07/27/2015 0615  NA 138  < > 139  < > 139  139 138 141  K 4.2  < > 4.1  < > 4.4  4.4 4.4 4.1  CL 97*  < > 96*  < > 92*  94* 91* 89*  CO2 27  --  30  --  33*  33*  --   --   GLUCOSE 107*  < > 107*  < > 99  99 104* 124*  BUN 45*  < > 42*   < > 39*  39* 41* 34*  CREATININE 2.53*  < > 2.49*  < > 2.43*  2.37* 2.70* 2.10*  CALCIUM 8.9  --  9.0  --  10.0  10.0  --   --   PHOS 3.6  --  4.4  --  4.0  4.1  --   --   < > = values in this interval not displayed. Liver Function Tests:  Recent Labs Lab 07/21/15 0358  07/22/15 0402 07/22/15 0403 07/22/15 0938 07/22/15 1515 08/01/2015 0405  AST 419*  --  230*  --   --   --  136*  ALT 1509*  --  1105*  --   --   --  605*  ALKPHOS 141*  --  190*  --   --   --  178*  BILITOT 13.2*  --  20.0*  --  21.2*  --  27.5*  PROT 5.2*  --  5.2*  --   --   --  5.4*  ALBUMIN 2.0*  < > 1.9* 1.8*  --  2.2* 2.1*  2.2*  < > = values in this interval not displayed. No results for input(s): LIPASE, AMYLASE in the last 168 hours. No results for input(s): AMMONIA in the last 168 hours. CBC:  Recent Labs Lab 07/20/15 1517  07/21/15 0358  07/21/15 1630  07/22/15 0402  08/04/2015 0405 07/29/2015 0611 08/03/2015 0615  WBC 17.6*  --  17.9*  --  18.2*  --  19.0*  --  18.9*  --   --   HGB 9.5*  < > 9.4*  < > 9.3*  < > 9.6*  < > 8.8* 9.9* 10.2*  HCT 28.2*  < > 27.7*  < > 27.5*  < > 28.2*  < > 26.4* 29.0* 30.0*  MCV 85.2  --  83.2  --  84.1  --  83.7  --  85.4  --   --   PLT 45*  --  48*  --  51*  --  52*  --  75*  --   --   < > = values in this interval not displayed. Cardiac Enzymes: No results for input(s): CKTOTAL, CKMB, CKMBINDEX, TROPONINI in the last 168 hours. CBG:  Recent Labs Lab 07/22/2015 0204 08/05/2015 0308 08/05/2015 0352 07/20/2015 0509 07/10/2015 0609  GLUCAP 88 106* 97 104* 116*    Iron Studies: No results for input(s): IRON, TIBC, TRANSFERRIN, FERRITIN in the last 72 hours. Studies/Results: Dg Chest Port 1 View  07/22/2015  CLINICAL DATA:  Re- do of CABG EXAM: PORTABLE CHEST 1 VIEW COMPARISON:  Portable chest of 07/21/2015 FINDINGS: Bilateral chest tubes are present. No pneumothorax is seen. There is bibasilar atelectasis right greater than left. Cardiomegaly is stable. The tip of the  endotracheal tube is approximately 6.1 cm above the carina. Swan-Ganz catheter tip is in the region of the main pulmonary artery. NG tube extends below the hemidiaphragm. IMPRESSION: 1. Bilateral chest tubes.  No pneumothorax. 2. Bibasilar atelectasis. 3. Endotracheal tube tip approximately 6.1 cm above the carina. Electronically Signed   By: Ivar Drape M.D.   On: 07/22/2015 08:13   Dg Abd Portable 1v  07/22/2015  CLINICAL DATA:  Re- do of CABG, ileus, followup EXAM: PORTABLE ABDOMEN - 1 VIEW COMPARISON:  Portable abdomen of 07/21/2015 FINDINGS: As noted previously, there is a paucity of bowel gas. The entire abdomen was not included on this portable film. No definite ileus or obstruction is seen. Left femoral vascular catheters are noted. Feeding tube coils in the antrum of the stomach. IMPRESSION: 1. Paucity of bowel gas.  No definite ileus or obstruction. 2. Feeding tube coils in the antrum of the stomach. Electronically Signed   By: Ivar Drape M.D.   On: 07/22/2015 08:15   US Abdomen Limited Ruq  07/22/2015  CLINICAL DATA:  Jaundice. EXAM: US ABDOMEN LIMITED - RIGHT UPPER QUADRANT COMPARISON:  Abdominal radiograph 07/22/2015 FINDINGS: Gallbladder: There is gallbladder wall thickening which measures up to 4.5 mm. No calculi year are seen. Common bile duct: Diameter: 5.4 mm Liver: No focal lesion identified. Within normal limits in parenchymal echogenicity. Normal direction of flow of the portal vein noted. Small amount of ascites is noted. There is a small right pleural effusion. IMPRESSION: No evidence of cholelithiasis, given the technical limitations of this exam. Diffuse gallbladder wall thickening, which measures up to 4.5 mm. Given the presence of ascites this may represent reactive gallbladder wall thickening. Acalculous cholecystitis is also a diagnostic consideration in the appropriate clinical setting. Electronically Signed   By: Thomas Hoff  Dimitrova M.D.   On: 07/22/2015 18:51    Medications: Infusions: . sodium chloride    . sodium chloride    . sodium chloride 20 mL/hr at 07/22/15 2000  . calcium gluconate infusion for CRRT 20 g (07/22/15 2256)  . EPINEPHrine 4 mg in dextrose 5% 250 mL infusion (16 mcg/mL) 15 mcg/min (07/13/2015 0336)  . fentaNYL infusion INTRAVENOUS 200 mcg/hr (07/22/15 2307)  . insulin (NOVOLIN-R) infusion 3.5 Units/hr (07/18/2015 0700)  . lactated ringers 20 mL/hr at 08/03/2015 2200  . lactated ringers 20 mL/hr at 08/05/2015 2200  . midazolam (VERSED) infusion 4 mg/hr (08/03/2015 0438)  . milrinone 0.125 mcg/kg/min (07/22/15 2000)  . norepinephrine (LEVOPHED) Adult infusion 10 mcg/min (07/16/2015 0717)  . dialysis replacement fluid (prismasate) 300 mL/hr at 08/03/2015 0130  . dialysate (PRISMASATE) 2,000 mL/hr at 07/18/2015 0230  . sodium citrate 2 %/dextrose 2.5% solution 3000 mL 500 mL/hr at 07/31/2015 0427    Scheduled Medications: . sodium chloride   Intravenous Once  . albumin human      . anticoagulant sodium citrate  5 mL Intravenous Once  . antiseptic oral rinse  7 mL Mouth Rinse 10 times per day  . bisacodyl  10 mg Oral Daily   Or  . bisacodyl  10 mg Rectal Daily  . chlorhexidine gluconate (SAGE KIT)  15 mL Mouth Rinse BID  . cisatracurium  0.05 mg/kg Intravenous Once  . darbepoetin (ARANESP) injection - NON-DIALYSIS  100 mcg Subcutaneous Q Fri-1800  . docusate sodium  200 mg Oral Daily  . famotidine (PEPCID) IV  20 mg Intravenous Q24H  . feeding supplement (NEPRO CARB STEADY)  1,000 mL Per Tube Q24H  . feeding supplement (PRO-STAT SUGAR FREE 64)  60 mL Oral QID  . insulin regular  0-10 Units Intravenous TID WC  . magnesium sulfate  4 g Intravenous Once  . metoCLOPramide (REGLAN) injection  10 mg Intravenous Q6H  . multivitamin  1 tablet Oral QHS  . pantoprazole  40 mg Oral Daily  . piperacillin-tazobactam (ZOSYN)  IV  3.375 g Intravenous Q6H  . sodium chloride flush  3 mL Intravenous Q12H  . vancomycin 1000 mg in NS (1000 ml)  irrigation for Dr. Roxy Manns case   Irrigation To OR  . vancomycin  1,000 mg Intravenous Q24H    have reviewed scheduled and prn medications.  Physical Exam: General:sedated on vent Heart: RRR Lungs: CBS bilat Abdomen: distended Extremities: dependent pitting edema Dialysis Access: left groin vascath placed 6/8- also left forearm AVF     07/08/2015,7:43 AM  LOS: 17 days

## 2015-07-23 NOTE — Plan of Care (Signed)
Sputum to lab, blood culture to lab

## 2015-07-23 NOTE — OR Nursing (Signed)
Thirty minute call to SICU at 0957. Spoke to [place].

## 2015-07-23 NOTE — Op Note (Signed)
Joseph [name], Joseph [name] NO.:  [id number]  MEDICAL RECORD NO.:  [id number]  LOCATION:  2S07C                        FACILITY:  [facility]  PHYSICIAN:  Ivin Poot, M.D.  DATE OF BIRTH:  [birth year]  DATE OF PROCEDURE:  07/23/2014 DATE OF DISCHARGE:                              OPERATIVE REPORT   OPERATIONS: 1. Sternal closure. 2. Placement of left subclavian vein central line.  SURGEON:  Ivin Poot, M.D.  PREOPERATIVE DIAGNOSIS:  Open sternum after redo sternotomy, redo coronary artery bypass graft and coagulopathy.  POSTOPERATIVE DIAGNOSIS:  Open sternum after redo sternotomy, redo coronary artery bypass graft and coagulopathy.  ANESTHESIA:  General.  DESCRIPTION OF PROCEDURE:  The patient was brought directly from the ICU to the operating room on ventilator support.  The transport was completed with the patient in stable condition.  The patient was placed on the OR table.  General anesthesia was induced.  A transesophageal echo probe was placed by the anesthesiologist.  This showed improved global ventricular function.  The previously-placed chest tubes were all removed.  The patient's chest and abdomen were prepped and draped as a sterile field.  A proper time-out was performed.  The skin staples were removed.  The subcutaneous tissue and chest wall fascia were opened.  There was minimal amount of clotted and non-clotted blood in the anterior mediastinum.  This was removed and the anterior mediastinum was irrigated.  The bypass grafts placed 1 week ago were visible and each had a good pulse and were patent.  There was no active bleeding from the heart.  I placed new bilateral pleural tubes and an anterior mediastinal tube and brought those out through the old chest tube sites and secured them to the skin.  The wound was irrigated with vancomycin irrigation.  Double gauge steel wire sutures were placed in the sternum.  The sternum was closed.  The  patient remained hemodynamically stable.  The echo showed stable ventricular function. The pectoralis fascia was closed with a running #1 Vicryl.  The subcutaneous layer was closed with a 2-0 Vicryl and the skin was closed with staples.  I placed a new triple-lumen catheter in the left subclavian vein using the Seldinger technique.  Followup chest x-ray showed this to be in good position.  The patient was then transported back on the ventilator and balloon pump support to the ICU.  Blood loss was approximately 150 mL.     Ivin Poot, M.D.     PV/MEDQ  D:  07/24/2015  T:  07/11/2015  Job:  [id number]

## 2015-07-23 NOTE — Progress Notes (Signed)
Patient ID: Joseph [name], male   DOB: Jul 14, 1945, 70 y.o.   MRN: [id number]  SICU Evening Rounds:   Hemodynamically stable  CI = 4.5 on milrinone 0.125, leveophed 10, epi 15. IABP on 1:3, plan to remove tomorrow.  Sedated on vent.   CRRT is off now. Plan to restart tomorrow.  CT output low  CBC    Component Value Date/Time   WBC 19.3* 07/20/2015 1100   WBC 4.9 03/18/2015 1539   WBC 4.7 05/26/2014 1024   RBC 2.73* 07/08/2015 1100   RBC 2.91* 03/18/2015 1539   RBC 3.02* 11/06/2014 0421   RBC 3.61* 05/26/2014 1024   RBC 5.35 03/23/2008 1117   HGB 8.0* 07/16/2015 1100   HGB 8.8* 07/30/2015 1100   HGB 10.2* 05/26/2014 1024   HGB 15.8 03/23/2008 1117   HCT 24.0* 07/30/2015 1100   HCT 26.0* 07/30/2015 1100   HCT 25.8* 03/18/2015 1539   HCT 30.6* 05/26/2014 1024   HCT 46.2 03/23/2008 1117   PLT 60* 07/24/2015 1100   PLT 99* 03/18/2015 1539   PLT 80* 05/26/2014 1024   PLT 93* 03/23/2008 1117   MCV 87.9 08/06/2015 1100   MCV 89 03/18/2015 1539   MCV 85 05/26/2014 1024   MCV 86 03/23/2008 1117   MCH 29.3 07/25/2015 1100   MCH 29.2 03/18/2015 1539   MCH 28.3 05/26/2014 1024   MCH 29.5 03/23/2008 1117   MCHC 33.3 07/11/2015 1100   MCHC 32.9 03/18/2015 1539   MCHC 33.4 05/26/2014 1024   MCHC 34.1 03/23/2008 1117   RDW 18.6* 07/16/2015 1100   RDW 16.8* 03/18/2015 1539   RDW 15.7* 05/26/2014 1024   RDW 12.7 03/23/2008 1117   LYMPHSABS 0.7* 05/25/2015 1343   LYMPHSABS 0.6* 09/11/2014 0000   LYMPHSABS 0.7* 05/26/2014 1024   LYMPHSABS 1.3 03/23/2008 1117   MONOABS 0.4 05/25/2015 1343   MONOABS 0.3 05/26/2014 1024   EOSABS 0.0 05/25/2015 1343   EOSABS 0.1 09/11/2014 0000   EOSABS 0.1 05/26/2014 1024   EOSABS 0.1 03/23/2008 1117   BASOSABS 0.0 05/25/2015 1343   BASOSABS 0.0 09/11/2014 0000   BASOSABS 0.0 05/26/2014 1024   BASOSABS 0.1 03/23/2008 1117     BMET    Component Value Date/Time   NA 139 07/10/2015 1603   NA 143 09/11/2014 1549   NA 143 01/05/2014 1334    NA 139 02/04/2008 1320   K 4.5 07/22/2015 1603   K 5.5* 01/05/2014 1334   K 4.3 02/04/2008 1320   CL 95* 07/16/2015 1603   CL 112* 01/05/2014 1334   CL 102 02/04/2008 1320   CO2 31 07/20/2015 1603   CO2 21 01/05/2014 1334   CO2 28 02/04/2008 1320   GLUCOSE 125* 07/27/2015 1603   GLUCOSE 200* 09/11/2014 1549   GLUCOSE 104* 01/05/2014 1334   GLUCOSE 240* 02/04/2008 1320   BUN 53* 07/16/2015 1603   BUN 57* 09/11/2014 1549   BUN 88* 01/05/2014 1334   BUN 18 02/04/2008 1320   CREATININE 3.18* 07/15/2015 1603   CREATININE 3.52* 05/26/2014 1024   CREATININE 1.3* 02/04/2008 1320   CALCIUM 9.0 08/05/2015 1603   CALCIUM 7.5* 11/06/2014 0421   CALCIUM 6.9* 05/26/2014 1024   CALCIUM 8.8 02/04/2008 1320   GFRNONAA 19* 07/22/2015 1603   GFRNONAA 17* 05/26/2014 1024   GFRNONAA 19* 01/05/2014 1334   GFRAA 22* 07/11/2015 1603   GFRAA 20* 05/26/2014 1024   GFRAA 23* 01/05/2014 1334     A/P:  Stable postop course after  chest closure today. Continue current plans

## 2015-07-24 ENCOUNTER — Inpatient Hospital Stay (HOSPITAL_COMMUNITY): Payer: Medicare Other

## 2015-07-24 DIAGNOSIS — R570 Cardiogenic shock: Secondary | ICD-10-CM | POA: Insufficient documentation

## 2015-07-24 LAB — POCT I-STAT, CHEM 8
BUN: 61 mg/dL — AB (ref 6–20)
BUN: 73 mg/dL — ABNORMAL HIGH (ref 6–20)
BUN: 58 mg/dL — AB (ref 6–20)
BUN: 72 mg/dL — AB (ref 6–20)
BUN: 57 mg/dL — AB (ref 6–20)
BUN: 68 mg/dL — AB (ref 6–20)
BUN: 55 mg/dL — ABNORMAL HIGH (ref 6–20)
BUN: 65 mg/dL — AB (ref 6–20)
BUN: 52 mg/dL — AB (ref 6–20)
BUN: 63 mg/dL — ABNORMAL HIGH (ref 6–20)
CALCIUM ION: 0.6 mmol/L — AB (ref 1.13–1.30)
CALCIUM ION: 0.59 mmol/L — AB (ref 1.13–1.30)
CALCIUM ION: 1.01 mmol/L — AB (ref 1.13–1.30)
CHLORIDE: 91 mmol/L — AB (ref 101–111)
CHLORIDE: 93 mmol/L — AB (ref 101–111)
CHLORIDE: 91 mmol/L — AB (ref 101–111)
CHLORIDE: 92 mmol/L — AB (ref 101–111)
CHLORIDE: 89 mmol/L — AB (ref 101–111)
CHLORIDE: 89 mmol/L — AB (ref 101–111)
CHLORIDE: 90 mmol/L — AB (ref 101–111)
CREATININE: 4.5 mg/dL — AB (ref 0.61–1.24)
CREATININE: 3.5 mg/dL — AB (ref 0.61–1.24)
CREATININE: 4.4 mg/dL — AB (ref 0.61–1.24)
CREATININE: 3.9 mg/dL — AB (ref 0.61–1.24)
CREATININE: 3 mg/dL — AB (ref 0.61–1.24)
CREATININE: 4.1 mg/dL — AB (ref 0.61–1.24)
Calcium, Ion: 0.98 mmol/L — ABNORMAL LOW (ref 1.13–1.30)
Calcium, Ion: 0.56 mmol/L — CL (ref 1.13–1.30)
Calcium, Ion: 1.01 mmol/L — ABNORMAL LOW (ref 1.13–1.30)
Calcium, Ion: 0.55 mmol/L — CL (ref 1.13–1.30)
Calcium, Ion: 1.01 mmol/L — ABNORMAL LOW (ref 1.13–1.30)
Calcium, Ion: 0.51 mmol/L — CL (ref 1.13–1.30)
Calcium, Ion: 1.01 mmol/L — ABNORMAL LOW (ref 1.13–1.30)
Chloride: 91 mmol/L — ABNORMAL LOW (ref 101–111)
Chloride: 92 mmol/L — ABNORMAL LOW (ref 101–111)
Chloride: 92 mmol/L — ABNORMAL LOW (ref 101–111)
Creatinine, Ser: 3.5 mg/dL — ABNORMAL HIGH (ref 0.61–1.24)
Creatinine, Ser: 3.1 mg/dL — ABNORMAL HIGH (ref 0.61–1.24)
Creatinine, Ser: 4.2 mg/dL — ABNORMAL HIGH (ref 0.61–1.24)
Creatinine, Ser: 3 mg/dL — ABNORMAL HIGH (ref 0.61–1.24)
GLUCOSE: 166 mg/dL — AB (ref 65–99)
GLUCOSE: 188 mg/dL — AB (ref 65–99)
GLUCOSE: 156 mg/dL — AB (ref 65–99)
GLUCOSE: 169 mg/dL — AB (ref 65–99)
GLUCOSE: 141 mg/dL — AB (ref 65–99)
Glucose, Bld: 189 mg/dL — ABNORMAL HIGH (ref 65–99)
Glucose, Bld: 135 mg/dL — ABNORMAL HIGH (ref 65–99)
Glucose, Bld: 175 mg/dL — ABNORMAL HIGH (ref 65–99)
Glucose, Bld: 126 mg/dL — ABNORMAL HIGH (ref 65–99)
Glucose, Bld: 184 mg/dL — ABNORMAL HIGH (ref 65–99)
HCT: 27 % — ABNORMAL LOW (ref 39.0–52.0)
HCT: 30 % — ABNORMAL LOW (ref 39.0–52.0)
HCT: 28 % — ABNORMAL LOW (ref 39.0–52.0)
HCT: 28 % — ABNORMAL LOW (ref 39.0–52.0)
HCT: 26 % — ABNORMAL LOW (ref 39.0–52.0)
HCT: 29 % — ABNORMAL LOW (ref 39.0–52.0)
HEMATOCRIT: 29 % — AB (ref 39.0–52.0)
HEMATOCRIT: 31 % — AB (ref 39.0–52.0)
HEMATOCRIT: 27 % — AB (ref 39.0–52.0)
HEMATOCRIT: 30 % — AB (ref 39.0–52.0)
HEMOGLOBIN: 9.2 g/dL — AB (ref 13.0–17.0)
HEMOGLOBIN: 9.2 g/dL — AB (ref 13.0–17.0)
Hemoglobin: 10.2 g/dL — ABNORMAL LOW (ref 13.0–17.0)
Hemoglobin: 9.5 g/dL — ABNORMAL LOW (ref 13.0–17.0)
Hemoglobin: 9.5 g/dL — ABNORMAL LOW (ref 13.0–17.0)
Hemoglobin: 10.5 g/dL — ABNORMAL LOW (ref 13.0–17.0)
Hemoglobin: 9.9 g/dL — ABNORMAL LOW (ref 13.0–17.0)
Hemoglobin: 10.2 g/dL — ABNORMAL LOW (ref 13.0–17.0)
Hemoglobin: 8.8 g/dL — ABNORMAL LOW (ref 13.0–17.0)
Hemoglobin: 9.9 g/dL — ABNORMAL LOW (ref 13.0–17.0)
POTASSIUM: 4.2 mmol/L (ref 3.5–5.1)
POTASSIUM: 3.7 mmol/L (ref 3.5–5.1)
POTASSIUM: 3.7 mmol/L (ref 3.5–5.1)
POTASSIUM: 3.6 mmol/L (ref 3.5–5.1)
POTASSIUM: 3.7 mmol/L (ref 3.5–5.1)
Potassium: 4.2 mmol/L (ref 3.5–5.1)
Potassium: 3.8 mmol/L (ref 3.5–5.1)
Potassium: 3.8 mmol/L (ref 3.5–5.1)
Potassium: 3.7 mmol/L (ref 3.5–5.1)
Potassium: 3.8 mmol/L (ref 3.5–5.1)
SODIUM: 137 mmol/L (ref 135–145)
Sodium: 136 mmol/L (ref 135–145)
Sodium: 138 mmol/L (ref 135–145)
Sodium: 137 mmol/L (ref 135–145)
Sodium: 138 mmol/L (ref 135–145)
Sodium: 139 mmol/L (ref 135–145)
Sodium: 135 mmol/L (ref 135–145)
Sodium: 139 mmol/L (ref 135–145)
Sodium: 137 mmol/L (ref 135–145)
Sodium: 139 mmol/L (ref 135–145)
TCO2: 30 mmol/L (ref 0–100)
TCO2: 30 mmol/L (ref 0–100)
TCO2: 30 mmol/L (ref 0–100)
TCO2: 31 mmol/L (ref 0–100)
TCO2: 31 mmol/L (ref 0–100)
TCO2: 31 mmol/L (ref 0–100)
TCO2: 31 mmol/L (ref 0–100)
TCO2: 32 mmol/L (ref 0–100)
TCO2: 31 mmol/L (ref 0–100)
TCO2: 31 mmol/L (ref 0–100)

## 2015-07-24 LAB — PROTIME-INR
INR: 2.1 — ABNORMAL HIGH (ref 0.00–1.49)
Prothrombin Time: 23.4 seconds — ABNORMAL HIGH (ref 11.6–15.2)

## 2015-07-24 LAB — PREPARE FRESH FROZEN PLASMA
Unit division: 0
Unit division: 0

## 2015-07-24 LAB — RENAL FUNCTION PANEL
Albumin: 2.2 g/dL — ABNORMAL LOW (ref 3.5–5.0)
Anion gap: 15 (ref 5–15)
BUN: 79 mg/dL — ABNORMAL HIGH (ref 6–20)
CO2: 29 mmol/L (ref 22–32)
Calcium: 8.8 mg/dL — ABNORMAL LOW (ref 8.9–10.3)
Chloride: 93 mmol/L — ABNORMAL LOW (ref 101–111)
Creatinine, Ser: 4 mg/dL — ABNORMAL HIGH (ref 0.61–1.24)
GFR calc Af Amer: 16 mL/min — ABNORMAL LOW (ref >60)
GFR calc non Af Amer: 14 mL/min — ABNORMAL LOW (ref >60)
Glucose, Bld: 132 mg/dL — ABNORMAL HIGH (ref 65–99)
Phosphorus: 6 mg/dL — ABNORMAL HIGH (ref 2.5–4.6)
Potassium: 3.8 mmol/L (ref 3.5–5.1)
Sodium: 137 mmol/L (ref 135–145)

## 2015-07-24 LAB — COMPREHENSIVE METABOLIC PANEL
ALT: 358 U/L — ABNORMAL HIGH (ref 17–63)
AST: 96 U/L — ABNORMAL HIGH (ref 15–41)
Albumin: 2.1 g/dL — ABNORMAL LOW (ref 3.5–5.0)
Alkaline Phosphatase: 148 U/L — ABNORMAL HIGH (ref 38–126)
Anion gap: 16 — ABNORMAL HIGH (ref 5–15)
BUN: 75 mg/dL — ABNORMAL HIGH (ref 6–20)
CO2: 29 mmol/L (ref 22–32)
Calcium: 8.5 mg/dL — ABNORMAL LOW (ref 8.9–10.3)
Chloride: 92 mmol/L — ABNORMAL LOW (ref 101–111)
Creatinine, Ser: 4.33 mg/dL — ABNORMAL HIGH (ref 0.61–1.24)
GFR calc Af Amer: 15 mL/min — ABNORMAL LOW (ref >60)
GFR calc non Af Amer: 13 mL/min — ABNORMAL LOW (ref >60)
Glucose, Bld: 131 mg/dL — ABNORMAL HIGH (ref 65–99)
Potassium: 4.7 mmol/L (ref 3.5–5.1)
Sodium: 137 mmol/L (ref 135–145)
Total Bilirubin: 32 mg/dL (ref 0.3–1.2)
Total Protein: 5.3 g/dL — ABNORMAL LOW (ref 6.5–8.1)

## 2015-07-24 LAB — GLUCOSE, CAPILLARY
GLUCOSE-CAPILLARY: 125 mg/dL — AB (ref 65–99)
GLUCOSE-CAPILLARY: 129 mg/dL — AB (ref 65–99)
GLUCOSE-CAPILLARY: 131 mg/dL — AB (ref 65–99)
GLUCOSE-CAPILLARY: 132 mg/dL — AB (ref 65–99)
GLUCOSE-CAPILLARY: 132 mg/dL — AB (ref 65–99)
GLUCOSE-CAPILLARY: 135 mg/dL — AB (ref 65–99)
GLUCOSE-CAPILLARY: 135 mg/dL — AB (ref 65–99)
GLUCOSE-CAPILLARY: 141 mg/dL — AB (ref 65–99)
GLUCOSE-CAPILLARY: 145 mg/dL — AB (ref 65–99)
GLUCOSE-CAPILLARY: 146 mg/dL — AB (ref 65–99)
GLUCOSE-CAPILLARY: 148 mg/dL — AB (ref 65–99)
GLUCOSE-CAPILLARY: 157 mg/dL — AB (ref 65–99)
GLUCOSE-CAPILLARY: 159 mg/dL — AB (ref 65–99)
GLUCOSE-CAPILLARY: 164 mg/dL — AB (ref 65–99)
GLUCOSE-CAPILLARY: 165 mg/dL — AB (ref 65–99)
Glucose-Capillary: 160 mg/dL — ABNORMAL HIGH (ref 65–99)
Glucose-Capillary: 154 mg/dL — ABNORMAL HIGH (ref 65–99)
Glucose-Capillary: 146 mg/dL — ABNORMAL HIGH (ref 65–99)
Glucose-Capillary: 141 mg/dL — ABNORMAL HIGH (ref 65–99)
Glucose-Capillary: 143 mg/dL — ABNORMAL HIGH (ref 65–99)

## 2015-07-24 LAB — TYPE AND SCREEN
ABO/RH(D): A POS
Antibody Screen: NEGATIVE
Unit division: 0

## 2015-07-24 LAB — CBC
HCT: 25.4 % — ABNORMAL LOW (ref 39.0–52.0)
Hemoglobin: 8.4 g/dL — ABNORMAL LOW (ref 13.0–17.0)
MCH: 28.3 pg (ref 26.0–34.0)
MCHC: 33.1 g/dL (ref 30.0–36.0)
MCV: 85.5 fL (ref 78.0–100.0)
Platelets: 90 10*3/uL — ABNORMAL LOW (ref 150–400)
RBC: 2.97 MIL/uL — ABNORMAL LOW (ref 4.22–5.81)
RDW: 18.8 % — ABNORMAL HIGH (ref 11.5–15.5)
WBC: 21.3 10*3/uL — ABNORMAL HIGH (ref 4.0–10.5)

## 2015-07-24 LAB — PREPARE PLATELET PHERESIS
UNIT DIVISION: 0
Unit division: 0
Unit division: 0

## 2015-07-24 LAB — APTT: aPTT: 38 seconds — ABNORMAL HIGH (ref 24–37)

## 2015-07-24 LAB — POCT I-STAT 3, ART BLOOD GAS (G3+)
Acid-Base Excess: 9 mmol/L — ABNORMAL HIGH (ref 0.0–2.0)
BICARBONATE: 31.9 meq/L — AB (ref 20.0–24.0)
O2 Saturation: 96 %
PO2 ART: 74 mmHg — AB (ref 80.0–100.0)
TCO2: 33 mmol/L (ref 0–100)
pCO2 arterial: 37.7 mmHg (ref 35.0–45.0)
pH, Arterial: 7.538 — ABNORMAL HIGH (ref 7.350–7.450)

## 2015-07-24 LAB — PHOSPHORUS: Phosphorus: 5.9 mg/dL — ABNORMAL HIGH (ref 2.5–4.6)

## 2015-07-24 LAB — CARBOXYHEMOGLOBIN
Carboxyhemoglobin: 1.4 % (ref 0.5–1.5)
Methemoglobin: 0.6 % (ref 0.0–1.5)
O2 Saturation: 65.6 %
Total hemoglobin: 8.3 g/dL — ABNORMAL LOW (ref 13.5–18.0)

## 2015-07-24 LAB — VANCOMYCIN, RANDOM: Vancomycin Rm: 35 ug/mL

## 2015-07-24 LAB — MAGNESIUM: Magnesium: 2.6 mg/dL — ABNORMAL HIGH (ref 1.7–2.4)

## 2015-07-24 LAB — PROCALCITONIN: Procalcitonin: 12.6 ng/mL

## 2015-07-24 MED ORDER — SODIUM CHLORIDE 0.9 % IV SOLN
500.0000 mg | Freq: Three times a day (TID) | INTRAVENOUS | Status: DC
  Administered 2015-07-24 – 2015-08-04 (×34): 500 mg via INTRAVENOUS
  Filled 2015-07-24 (×36): qty 500

## 2015-07-24 MED ORDER — DEXTROSE 5 % IV SOLN
Status: DC
  Administered 2015-07-24 – 2015-07-26 (×7): via INTRAVENOUS_CENTRAL
  Filled 2015-07-24 (×10): qty 1500

## 2015-07-24 MED ORDER — SODIUM CHLORIDE 0.9 % FOR CRRT
INTRAVENOUS_CENTRAL | Status: DC | PRN
  Administered 2015-07-26 – 2015-07-29 (×11): via INTRAVENOUS_CENTRAL
  Filled 2015-07-24 (×12): qty 1000

## 2015-07-24 MED ORDER — TRACE MINERALS CR-CU-MN-SE-ZN 10-1000-500-60 MCG/ML IV SOLN
INTRAVENOUS | Status: AC
  Administered 2015-07-24: 19:00:00 via INTRAVENOUS
  Filled 2015-07-24: qty 1000

## 2015-07-24 MED ORDER — SODIUM CHLORIDE 0.9% FLUSH: 10.0000 mL | INTRAVENOUS | Status: DC | PRN

## 2015-07-24 MED ORDER — PRISMASOL BGK 4/2.5 32-4-2.5 MEQ/L IV SOLN
INTRAVENOUS | Status: DC
  Administered 2015-07-24 – 2015-08-09 (×20): via INTRAVENOUS_CENTRAL
  Filled 2015-07-24 (×26): qty 5000

## 2015-07-24 MED ORDER — DEXTROSE 5 % IV SOLN
20.0000 g | INTRAVENOUS | Status: DC
  Administered 2015-07-24 – 2015-07-26 (×3): 20 g via INTRAVENOUS_CENTRAL
  Filled 2015-07-24 (×4): qty 200

## 2015-07-24 MED ORDER — SODIUM CHLORIDE 0.9 % IV SOLN
Freq: Once | INTRAVENOUS | Status: AC
  Administered 2015-07-24: 10:00:00 via INTRAVENOUS

## 2015-07-24 MED ORDER — PRISMASOL BGK 4/2.5 32-4-2.5 MEQ/L IV SOLN
INTRAVENOUS | Status: DC
  Administered 2015-07-24 – 2015-08-10 (×94): via INTRAVENOUS_CENTRAL
  Filled 2015-07-24 (×133): qty 5000

## 2015-07-24 MED ORDER — HEPARIN SODIUM (PORCINE) 1000 UNIT/ML DIALYSIS
1000.0000 [IU] | INTRAMUSCULAR | Status: DC | PRN
  Filled 2015-07-24: qty 6

## 2015-07-24 NOTE — Progress Notes (Signed)
CT Surgery  IABP pulled HAND held pressure over L femoral artery 50 minutes L foot doppler pulse strong Will check Hb this afternoon Swan, sleeve pulled today Vancomycin per pharm ordered

## 2015-07-24 NOTE — Progress Notes (Signed)
PARENTERAL NUTRITION CONSULT NOTE - INITIAL  Pharmacy Consult for TPN Indication:   Allergies  Allergen Reactions  . Sulfa Antibiotics Hives and Itching  . Morphine And Related     Makes patient feel weird   Patient Measurements: Height: '5\' 11"'$  (180.3 cm) Weight: 240 lb 15.4 oz (109.3 kg) IBW/kg (Calculated) : 75.3  Vital Signs: Temp: 99.1 F (37.3 C) (06/17 0830) Temp Source: Core (Comment) (06/17 0830) Pulse Rate: 88 (06/17 0830) Intake/Output from previous day: 06/16 0701 - 06/17 0700 In: 5423.6 [I.V.:3181.6; Blood:762; NG/GT:530; IV Piggyback:950] Out: 450 [Blood:100; Chest Tube:290] Intake/Output from this shift: Total I/O In: 171.5 [I.V.:101.5; Blood:30; NG/GT:40] Out: -   Labs:  Recent Labs  07/12/2015 0405  07/08/2015 0830 08/01/2015 1100 07/24/15 0430  WBC 18.9*  --   --  19.3* 21.3*  HGB 8.8*  < > 9.5* 8.0*  8.8* 8.4*  HCT 26.4*  < > 28.0* 24.0*  26.0* 25.4*  PLT 75*  --   --  60* 90*  APTT  --   --   --  34 38*  INR  --   --   --  1.86* 2.10*  < > = values in this interval not displayed.   Recent Labs  07/22/15 0402  07/22/15 0938  08/05/2015 0405  08/03/2015 0830 07/26/2015 1100 07/30/2015 1603 07/24/15 0430  NA 138  < >  --   < > 139  139  < > 137 139 139 137  K 4.2  < >  --   < > 4.4  4.4  < > 4.4 4.4 4.5 4.7  CL 97*  < >  --   < > 92*  94*  < > 92*  --  95* 92*  CO2 26  < >  --   < > 33*  33*  --   --   --  31 29  GLUCOSE 106*  < >  --   < > 99  99  < > 100* 122* 125* 131*  BUN 44*  < >  --   < > 39*  39*  < > 48*  --  53* 75*  CREATININE 2.59*  < >  --   < > 2.43*  2.37*  < > 2.80*  --  3.18* 4.33*  CALCIUM 9.0  < >  --   < > 10.0  10.0  --   --   --  9.0 8.5*  MG 2.5*  --   --   --  2.2  --   --   --   --  2.6*  PHOS 3.6  < >  --   < > 4.0  4.1  --   --   --  4.8* 5.9*  PROT 5.2*  --   --   --  5.4*  --   --   --   --  5.3*  ALBUMIN 1.9*  < >  --   < > 2.1*  2.2*  --   --   --  2.1* 2.1*  AST 230*  --   --   --  136*  --   --   --    --  96*  ALT 1105*  --   --   --  605*  --   --   --   --  358*  ALKPHOS 190*  --   --   --  178*  --   --   --   --  148*  BILITOT 20.0*  --  21.2*  --  27.5*  --   --   --   --  32.0*  BILIDIR  --   --  14.8*  --   --   --   --   --   --   --   IBILI  --   --  6.4*  --   --   --   --   --   --   --   < > = values in this interval not displayed. Estimated Creatinine Clearance: 20.8 mL/min (by C-G formula based on Cr of 4.33).    Recent Labs  07/24/15 0522 07/24/15 0557 07/24/15 0654  GLUCAP 135* 129* 132*    Medical History: Past Medical History  Diagnosis Date  . Hypertensive heart disease   . Hypercholesterolemia   . Prostate cancer ([place])   . Hepatosplenomegaly 2015    fibrosis, no cirrhosis on 06/2013 liver biopsy  . Thrombocytopenia ([place]) 2015  . Depression with anxiety   . IDDM (insulin dependent diabetes mellitus) ([place])     INSULIN DEPENDENT  . GERD (gastroesophageal reflux disease)   . History of shingles   . Ischemic cardiomyopathy     a. echo 08/06/2014: EF 25-30%, multiple WMA, mod DD, PASP 49 mm Hg;  b. 12/2014 Echo: EF 30-35%, diff HK, mild MR.; Echo 03/2015 - ? LV thrombus (not seen in 3/27 - but EF ? 40-45%.   . CKD (chronic kidney disease), stage IV ([place])     a. previously on dialysis 11/2013->04/2014;  b. 12/2014 Dialysis resumed.  . Coronary artery disease, occlusive     a. 2001 MI s/p 4v CABG (LIMA-LAD, SVG-D2, SVG-OM1, SVG-right PDA, SVG-right PL1); b. cath 07/30/2014 vein grafts all down, patent LIMA to LAD, significant LM and ost LCx dx; c. 08/07/2014 PCI Synergy DES 2.5 mm x 18 mm to dist LM and ost LCx, rec lifelong DAPT.; 02/2015 - Patent LIMA-LAD with L-R Collaterals, 60% ISR of LM-Cx stent (non-occlusive)  . Chronic combined systolic and diastolic CHF, NYHA class 2 ([place])     a. 08/2013 Echo: EF 40-45%, impaired relaxation;  b. 07/2014 Echo: EF 25-30%;  c. 12/2014 Echo: EF 30-35%, diff HK, mild MR.  . Gallstone pancreatitis 2015  . ESRD (end stage renal  disease) ([place])    Insulin Requirements in the past 24 hours:  IDDM - currently on an insulin drip with rate at 13.7 units/hr   Current Nutrition:  NPO - 6/9 >> started Nepro at 76m/hr on 6/12, Prostat supplement [id number] (LD 9PM 6/16)  Admit:  [id number] with significant PMH of CAD presents with STEMI with continued CP  Surgeries/Procedures: 6/8 Redo sternotomy, CABG - IABP (hope to remove today)  GI: Jaundice - shocked liver -- Albumin 2.1, AST/ALT elevated 96/358, alk phos is 148.  Tbili = 32, NGT with [id number] - Has IV Pepcid on board  Endo:. CBG's 129-146 - good control  Lytes: Electrolytes WNL except slightly elevated Phos - getting Calcium Gluc 20gm with CRRT  Renal: ESRD - Anuric - CVVHD ongoing - will use Clinimix without electrolytes and follow renal for replacement  Pulm: Intubated postop due to acute hypoxemic failure - CXR = no pneumothroax, atelectasis decreasing  Cards: Hx. CAD - CABG 2001, CHF/PCI, Dyslipidemia - NSR - Remains on Epinephrine/Milrinone, continues to receive FFP this AM for bleeding issues 10.2 >8.4  Hepatobil: Hypercoagulable - 23.4/2.1, Darbo every Friday - surgery transfusions - RBC #18, Cryo -  6units, Plasma -8 units, platelets 11 units.  Has continued to require ongoing support for oozing and thrombocytopenia issues.  Neuro: Intubated and sedated with Fent/Versed - GCS = 9 and RASS of -2  ID: ? Of HCAP - started empiric antibiotics.  WBC - 21.3,  PCT 12.6, tmax to 102.9 - now 99.3  Best Practices: SCD  TPN Access: CVC 6/16 TPN start date: 6/17>>  Nutritional Goals:  1315-1673 Kcal and 150-160gm protein - RD assessment  Plan:  Will begin Clinimix 5/15 without electrolytes at 77m/hr x 24 hours to see how he responds, will titrate to goal of 644mhr to meet kcal needs but will be unable to meet protein needs at this rate. Will start Lipids tomorrow since he has been on TF Continue insulin as ordered Will add Pepcid to TPN tomorrow to  decrease fluid volumes  NiRober MinionPharmD., MS Clinical Pharmacist Pager:  [phone] you for allowing pharmacy to be part of this patients care team. 07/24/2015,8:46 AM

## 2015-07-24 NOTE — Progress Notes (Signed)
Subjective:  s/p sternal wound closure and new central line-- CRRT off for 24 hours- CVP up and back on 60% fio2 on vent- also febrile Objective Vital signs in last 24 hours: Filed Vitals:   07/24/15 0630 07/24/15 0645 07/24/15 0700 07/24/15 0715  BP:      Pulse: 91 90 89 88  Temp: 99.1 F (37.3 C) 99.1 F (37.3 C) 99.1 F (37.3 C) 99.1 F (37.3 C)  TempSrc:      Resp: 36 36 36 37  Height:      Weight:      SpO2: 99% 98% 98% 98%   Weight change: 3.2 kg (7 lb 0.9 oz)  Intake/Output Summary (Last 24 hours) at 07/24/15 0747 Last data filed at 07/24/15 0700  Gross per 24 hour  Intake 5173.63 ml  Output    450 ml  Net 4723.63 ml    Assessment/ Plan: Pt is Joseph [name] 70 y.o. yo male with ESRD who was admitted on 06/07/2015 with CP and subsequent redo CABG that has been complicated by blood loss req transfusion and hemodynamic instability - balloon pump  Assessment/Plan: 1. CAD- complicated redo CABG- on pressors, milrinone, amiodarone- balloon pump -  2. ESRD - normally TTS [facility]- has been off and on CRRT here- to resume CRRT today via left groin HD cath (placed 6/8)- citrate protocol-  also with left forearm AVF - elytes stable on 4 K bath- Mag actually 2.6.    3. Anemia- complicating issue- has required transfusions- have added darbe- trending up 4. HTN/volume- resume CRRT with volume removal given rising CVP 5. Inc LFTs- shock liver ? - trending better except bili  Joseph Joseph [name]    Labs: Basic Metabolic Panel:  Recent Labs Lab 07/09/2015 0405  07/18/2015 0830 07/14/2015 1100 07/26/2015 1603 07/24/15 0430  NA 139  139  < > 137 139 139 137  K 4.4  4.4  < > 4.4 4.4 4.5 4.7  CL 92*  94*  < > 92*  --  95* 92*  CO2 33*  33*  --   --   --  31 29  GLUCOSE 99  99  < > 100* 122* 125* 131*  BUN 39*  39*  < > 48*  --  53* 75*  CREATININE 2.43*  2.37*  < > 2.80*  --  3.18* 4.33*  CALCIUM 10.0  10.0  --   --   --  9.0 8.5*  PHOS 4.0  4.1  --   --   --  4.8* 5.9*   < > = values in this interval not displayed. Liver Function Tests:  Recent Labs Lab 07/22/15 0402  07/22/15 0938  07/12/2015 0405 07/16/2015 1603 07/24/15 0430  AST 230*  --   --   --  136*  --  96*  ALT 1105*  --   --   --  605*  --  358*  ALKPHOS 190*  --   --   --  178*  --  148*  BILITOT 20.0*  --  21.2*  --  27.5*  --  32.0*  PROT 5.2*  --   --   --  5.4*  --  5.3*  ALBUMIN 1.9*  < >  --   < > 2.1*  2.2* 2.1* 2.1*  < > = values in this interval not displayed. No results for input(s): LIPASE, AMYLASE in the last 168 hours. No results for input(s): AMMONIA in the last 168 hours. CBC:  Recent Labs  Lab 07/21/15 1630  07/22/15 0402  07/19/2015 0405  08/03/2015 0830 07/16/2015 1100 07/24/15 0430  WBC 18.2*  --  19.0*  --  18.9*  --   --  19.3* 21.3*  HGB 9.3*  < > 9.6*  < > 8.8*  < > 9.5* 8.0*  8.8* 8.4*  HCT 27.5*  < > 28.2*  < > 26.4*  < > 28.0* 24.0*  26.0* 25.4*  MCV 84.1  --  83.7  --  85.4  --   --  87.9 85.5  PLT 51*  --  52*  --  75*  --   --  60* 90*  < > = values in this interval not displayed. Cardiac Enzymes: No results for input(s): CKTOTAL, CKMB, CKMBINDEX, TROPONINI in the last 168 hours. CBG:  Recent Labs Lab 07/24/15 0328 07/24/15 0417 07/24/15 0522 07/24/15 0557 07/24/15 0654  GLUCAP 145* 146* 135* 129* 132*    Iron Studies: No results for input(s): IRON, TIBC, TRANSFERRIN, FERRITIN in the last 72 hours. Studies/Results: Dg Chest Port 1 View  07/18/2015  CLINICAL DATA:  70 year old male status post redo CABG. Initial encounter. EXAM: PORTABLE CHEST 1 VIEW COMPARISON:  0617 hours today and earlier. FINDINGS: Portable AP supine view at 1202 hours. Endotracheal tube and visible enteric tubes remain stable. Left IJ approach Swan-Ganz catheter appears stable. There is now Joseph [name] left subclavian approach central line in place. The tip projects about 1 vertebral body below the level of the carina. Visible bilateral chest tubes appear stable. No pneumothorax or  pulmonary edema identified. Mildly increased bibasilar and bilateral infrahilar opacity. Stable cardiac size and mediastinal contours. IMPRESSION: 1. Left subclavian central line placed, tip at the lower SVC level. Otherwise stable lines and tubes. 2. No pneumothorax identified. Mildly increased bibasilar opacity, favor atelectasis. Electronically Signed   By: Genevie Ann M.D.   On: 08/01/2015 11:19   Dg Chest Port 1 View  07/30/2015  CLINICAL DATA:  70 year old male status post redo CABG. Initial encounter. EXAM: PORTABLE CHEST 1 VIEW COMPARISON:  07/22/2015 and earlier. FINDINGS: Portable AP semi upright view at 0617 hours. Endotracheal tube remains in place, tip at the level the clavicles. NG type tube and probably also Joseph [name] separate feeding tube remain in place coursing to the abdomen, tips not included. Bilateral chest tubes remain. Mediastinal tube remains. Left IJ approach Swan-Ganz catheter still in place, tip at the level of the main pulmonary artery. Intra aortic balloon pump in place, marker projects just below the aortic arch level. Stable cardiac size and mediastinal contours. No pneumothorax or pulmonary edema. Stable patchy opacity at the right lung base. No definite pleural effusion. No other confluent opacity. IMPRESSION: 1.  Stable lines and tubes. 2. Stable ventilation.  No pneumothorax or pulmonary edema. Electronically Signed   By: Genevie Ann M.D.   On: 07/21/2015 08:03   US Abdomen Limited Ruq  07/22/2015  CLINICAL DATA:  Jaundice. EXAM: US ABDOMEN LIMITED - RIGHT UPPER QUADRANT COMPARISON:  Abdominal radiograph 07/22/2015 FINDINGS: Gallbladder: There is gallbladder wall thickening which measures up to 4.5 mm. No calculi year are seen. Common bile duct: Diameter: 5.4 mm Liver: No focal lesion identified. Within normal limits in parenchymal echogenicity. Normal direction of flow of the portal vein noted. Small amount of ascites is noted. There is Joseph [name] small right pleural effusion. IMPRESSION: No  evidence of cholelithiasis, given the technical limitations of this exam. Diffuse gallbladder wall thickening, which measures up to 4.5 mm. Given the presence of ascites  this may represent reactive gallbladder wall thickening. Acalculous cholecystitis is also Joseph [name] diagnostic consideration in the appropriate clinical setting. Electronically Signed   By: Fidela Salisbury M.D.   On: 07/22/2015 18:51   Medications: Infusions: . sodium chloride    . sodium chloride    . sodium chloride 20 mL/hr at 07/25/2015 2000  . calcium gluconate infusion for CRRT Stopped (07/20/2015 1100)  . EPINEPHrine 4 mg in dextrose 5% 250 mL infusion (16 mcg/mL) 15 mcg/min (08/02/2015 2339)  . fentaNYL infusion INTRAVENOUS 50 mcg/hr (07/24/15 0600)  . insulin (NOVOLIN-R) infusion 15.7 Units/hr (07/24/15 0420)  . lactated ringers 20 mL/hr at 07/24/2015 2200  . lactated ringers 20 mL/hr at 07/29/2015 2200  . midazolam (VERSED) infusion 1 mg/hr (07/24/15 0600)  . milrinone 0.125 mcg/kg/min (07/24/15 0214)  . norepinephrine (LEVOPHED) Adult infusion Stopped (07/24/15 0630)  . dialysis replacement fluid (prismasate) 300 mL/hr at 08/03/2015 0130  . dialysate (PRISMASATE) 2,000 mL/hr at 08/04/2015 0230  . sodium citrate 2 %/dextrose 2.5% solution 3000 mL 500 mL/hr at 07/26/2015 0427    Scheduled Medications: . sodium chloride   Intravenous Once  . sodium chloride   Intravenous Once  . antiseptic oral rinse  7 mL Mouth Rinse 10 times per day  . bisacodyl  10 mg Oral Daily   Or  . bisacodyl  10 mg Rectal Daily  . chlorhexidine gluconate (SAGE KIT)  15 mL Mouth Rinse BID  . cisatracurium  0.05 mg/kg Intravenous Once  . darbepoetin (ARANESP) injection - NON-DIALYSIS  100 mcg Subcutaneous Q Fri-1800  . docusate sodium  200 mg Oral Daily  . famotidine (PEPCID) IV  20 mg Intravenous Q24H  . feeding supplement (NEPRO CARB STEADY)  1,000 mL Per Tube Q24H  . feeding supplement (PRO-STAT SUGAR FREE 64)  60 mL Oral QID  . insulin regular  0-10  Units Intravenous TID WC  . magnesium sulfate  4 g Intravenous Once  . metoCLOPramide (REGLAN) injection  10 mg Intravenous Q6H  . multivitamin  1 tablet Oral QHS  . pantoprazole  40 mg Oral Daily  . piperacillin-tazobactam (ZOSYN)  IV  2.25 g Intravenous Q8H  . sodium chloride flush  3 mL Intravenous Q12H    have reviewed scheduled and prn medications.  Physical Exam: General:sedated on vent Heart: RRR Lungs: CBS bilat Abdomen: distended Extremities: dependent pitting edema Dialysis Access: left groin vascath placed 6/8- also left forearm AVF     07/24/2015,7:47 AM  LOS: 18 days

## 2015-07-24 NOTE — Procedures (Signed)
Arterial Catheter Insertion Procedure Note Joseph [name] [id number] Jun 13, 1945  Procedure: Insertion of Arterial Catheter  Indications: Blood pressure monitoring  Procedure Details Consent: Unable to obtain consent because of altered level of consciousness. Time Out: Verified patient identification, verified procedure, site/side was marked, verified correct patient position, special equipment/implants available, medications/allergies/relevent history reviewed, required imaging and test results available.  Performed  Maximum sterile technique was used including antiseptics, cap, gloves, gown, hand hygiene, mask and sheet. Skin prep: Chlorhexidine; local anesthetic administered 20 gauge catheter was inserted into right radial artery using the Seldinger technique.  Evaluation Blood flow good; BP tracing good. Complications: No apparent complications.   Joseph [name] 07/24/2015

## 2015-07-24 NOTE — Progress Notes (Signed)
PULMONARY / CRITICAL CARE MEDICINE   Name: Joseph [name] MRN: [id number] DOB: 09-Mar-1945    ADMISSION DATE:  06/28/2015 CONSULTATION DATE:  07/16/2015  REFERRING MD:  Nils Pyle  CHIEF COMPLAINT:  Post cardiothoracic surgery ventilator management  HISTORY OF PRESENT ILLNESS:   70 y/o male with ESRD and significant CAD (CABG 2001, DES 2016, EF 40-45%) was admitted on 5/29 with NSTEMI and had persistent chest pain during his hospitalization so he was taken for a redo CABG on 6/8 (SVG to diag and SVG to OM, IABP placement and CVL placement).  Post operatively he had excessive bleeding from all chest drains (pleural and mediastinal) so he was taken back to the OR on 6/9 for re-exploration.  PCCM was consulted for ventilator management.    SUBJECTIVE:  Remains critically ill, jaundiced.  VITAL SIGNS: BP 116/50 mmHg  Pulse 89  Temp(Src) 99.1 F (37.3 C) (Core (Comment))  Resp 35  Ht 5\' 11"  (1.803 m)  Wt 109.3 kg (240 lb 15.4 oz)  BMI 33.62 kg/m2  SpO2 98%  HEMODYNAMICS: PAP: (31-43)/(23-36) 37/29 mmHg CVP:  [15 mmHg-26 mmHg] 21 mmHg CO:  [8.8 L/min-10.2 L/min] 8.8 L/min CI:  [4.1 L/min/m2-4.8 L/min/m2] 4.1 L/min/m2  VENTILATOR SETTINGS: Vent Mode:  [-] PRVC FiO2 (%):  [60 %-80 %] 60 % Set Rate:  [35 bmp] 35 bmp Vt Set:  [550 mL] 550 mL PEEP:  [8 cmH20] 8 cmH20 Plateau Pressure:  [26 cmH20-30 cmH20] 26 cmH20  INTAKE / OUTPUT: I/O last 3 completed shifts: In: 9060.4 [I.V.:5929.4; Blood:1251; NG/GT:780; IV Piggyback:1100] Out: 5419 [Emesis/NG output:200; JZ:[id number]; Blood:100; Chest Tube:380]  PHYSICAL EXAMINATION: General:  Critically ill on vent, CVVHD, jaundiced Neuro:  Heavily sedated, no longer paralyzed. HEENT:  Diffuse facial edema improved, ETT in place Cardiovascular:  Balloon pump, median sternotomy dressing dry, atrial paced, decreased drainage out of mediastinal and pleural tubes Lungs:  resps even non labored on vent, rales throughout, diminished bibasilar   Abdomen:  Distended, some focal superficial bruising, -bs  Musculoskeletal:  No bony abnormalities Skin:  Bruising all over extensor surfaces and belly, generalized 2++ edema  LABS:  BMET  Recent Labs Lab 07/24/2015 0405  08/05/2015 0830 07/22/2015 1100 07/21/2015 1603 07/24/15 0430  NA 139  139  < > 137 139 139 137  K 4.4  4.4  < > 4.4 4.4 4.5 4.7  CL 92*  94*  < > 92*  --  95* 92*  CO2 33*  33*  --   --   --  31 29  BUN 39*  39*  < > 48*  --  53* 75*  CREATININE 2.43*  2.37*  < > 2.80*  --  3.18* 4.33*  GLUCOSE 99  99  < > 100* 122* 125* 131*  < > = values in this interval not displayed.  Electrolytes  Recent Labs Lab 07/22/15 0402  07/11/2015 0405 07/11/2015 1603 07/24/15 0430  CALCIUM 9.0  < > 10.0  10.0 9.0 8.5*  MG 2.5*  --  2.2  --  2.6*  PHOS 3.6  < > 4.0  4.1 4.8* 5.9*  < > = values in this interval not displayed.  CBC  Recent Labs Lab 08/01/2015 0405  07/16/2015 0830 07/15/2015 1100 07/24/15 0430  WBC 18.9*  --   --  19.3* 21.3*  HGB 8.8*  < > 9.5* 8.0*  8.8* 8.4*  HCT 26.4*  < > 28.0* 24.0*  26.0* 25.4*  PLT 75*  --   --  60*  90*  < > = values in this interval not displayed.  Coag's  Recent Labs Lab 08/06/2015 1100 07/24/15 0430  APTT 34 38*  INR 1.86* 2.10*    Sepsis Markers  Recent Labs Lab 07/21/15 0358 07/19/2015 1100 07/24/15 0430  PROCALCITON 24.90 11.41 12.60    ABG  Recent Labs Lab 08/04/2015 1101 07/31/2015 1555 07/24/15 0418  PHART 7.536* 7.573* 7.538*  PCO2ART 43.4 40.9 37.7  PO2ART 76.0* 59.0* 74.0*    Liver Enzymes  Recent Labs Lab 07/22/15 0402  07/22/15 0938  07/29/2015 0405 07/10/2015 1603 07/24/15 0430  AST 230*  --   --   --  136*  --  96*  ALT 1105*  --   --   --  605*  --  358*  ALKPHOS 190*  --   --   --  178*  --  148*  BILITOT 20.0*  --  21.2*  --  27.5*  --  32.0*  ALBUMIN 1.9*  < >  --   < > 2.1*  2.2* 2.1* 2.1*  < > = values in this interval not displayed.  Cardiac Enzymes No results for input(s):  TROPONINI, PROBNP in the last 168 hours.  Glucose  Recent Labs Lab 07/24/15 0229 07/24/15 0328 07/24/15 0417 07/24/15 0522 07/24/15 0557 07/24/15 0654  GLUCAP 148* 145* 146* 135* 129* 132*    Imaging Dg Chest Port 1 View  07/24/2015  CLINICAL DATA:  Status post CABG EXAM: PORTABLE CHEST 1 VIEW COMPARISON:  Chest radiograph from one day prior. FINDINGS: Endotracheal tube tip is 4.9 cm above the carina. Enteric tube terminates in the gastric fundus. Left internal jugular Swan-Ganz catheter terminates in the main pulmonary artery. Left subclavian central venous catheter terminates in the lower third of the superior vena cava. Stable position of the bilateral chest tubes. Sternotomy wires appear aligned and intact. Skin staples overlie the midline chest. Stable cardiomediastinal silhouette with mild cardiomegaly. No pneumothorax. No pleural effusion. No overt pulmonary edema. Mild bibasilar atelectasis, decreased. IMPRESSION: 1. Support structures as described. 2. No pneumothorax. 3. Mild bibasilar atelectasis, decreased. 4. Stable mild cardiomegaly without overt pulmonary edema. Electronically Signed   By: Ilona Sorrel M.D.   On: 07/24/2015 07:54   Dg Chest Port 1 View  07/10/2015  CLINICAL DATA:  70 year old male status post redo CABG. Initial encounter. EXAM: PORTABLE CHEST 1 VIEW COMPARISON:  0617 hours today and earlier. FINDINGS: Portable AP supine view at 1202 hours. Endotracheal tube and visible enteric tubes remain stable. Left IJ approach Swan-Ganz catheter appears stable. There is now a left subclavian approach central line in place. The tip projects about 1 vertebral body below the level of the carina. Visible bilateral chest tubes appear stable. No pneumothorax or pulmonary edema identified. Mildly increased bibasilar and bilateral infrahilar opacity. Stable cardiac size and mediastinal contours. IMPRESSION: 1. Left subclavian central line placed, tip at the lower SVC level. Otherwise  stable lines and tubes. 2. No pneumothorax identified. Mildly increased bibasilar opacity, favor atelectasis. Electronically Signed   By: Genevie Ann M.D.   On: 07/31/2015 11:19     STUDIES:  6/8 TEE >  CULTURES: Sputum 6/10 >> neg  ANTIBIOTICS: 6/8 Vanc >> 6/10 Zosyn >>  SIGNIFICANT EVENTS: 6/8 Redo CABG 6/8 take back to OR emergently for diffuse bleeding 6/16 back to OR to close sternum  LINES/TUBES: Left forearm fistula 6/8 R IJ introducer >> 6/8 Multiple chest tubes (mediastinal and pleural bilateral) >> 6/8 CVC left femoral vein >>out 6/8  R radial arterial line >>6/17>>>6/17>>> 6/8 L IJ Swan Ganz >>6/17 L Rosalie TLC 6/16>>>  DISCUSSION: 70 y/o male s/p redo CABG 6/8 complicated by significant post operative bleeding in the setting of thrombocytopenia brought back to the Surgical ICU on 6/9 with hypoxemic respiratory failure.  He has baseline ESRD and DM2.  ASSESSMENT / PLAN:  PULMONARY A: Acute hypoxemic respiratory failure > Pulm edema, at risk for TRALI or TACO Post operative pleural bleeding Respiratory acidosis - suspect mostly V/Q mismatch from cardiogenic shock, improved Small R Pleural Effusion P:   Restart CRRT FiO2 60% pot op and PEEP 8. Intermittent CXR Chest tubes per thoracic surgery, replaced CT per surgery. No weaning due to hemodynamics. Possible trach next week. Need to ask who will do it TCVS or PCCM.   CARDIOVASCULAR A:  Severe Cardiogenic and hemorrhagic shock CAD s/p redo CABG P:  D/C levophed. Epi to 15 IABP out today after FFP Pacing per TCTS. Tele monitoring.  RENAL A:   ESRD CKD V P:   Continue CVVHD per renal -- tolerating volume removal on pressors. Resume cvvh in am.  Renal following Replace electrolytes as indicated.  GASTROINTESTINAL A:   Jaundice and coagulopathy due to shocked liver. P:   OG tube TPN per pharmacy due to high residuals. H2 blocker for stress ulcer prophylaxis Abdominal U/S to R/O  obstruction.  HEMATOLOGIC A:   Diffuse oozing due to coagulopathy from blood loss, thrombocytopenia P:  Transfuse per TCTS guidelines. Trend CBC, coags.  INFECTIOUS A:   ?HCAP - doubt, respiratory culture negative P:   Empiric abx vanc/zosyn. Monitor fever curve / WBC. D8/x abx, will continue for now. PCT elevated on 6/14. Will rpt today and see trend.   ENDOCRINE A:   DM2   P:   Monitor glucose Insulin gtt per TCTS post op protocol  NEUROLOGIC A:   Sedation needs for vent synchrony P:   RASS goal: -2 Fentanyl gtt  Versed gtt  FAMILY  - Updates:  No family bedside 6/17.   - Inter-disciplinary family meet or Palliative Care meeting due by:  day 7  The patient is critically ill with multiple organ systems failure and requires high complexity decision making for assessment and support, frequent evaluation and titration of therapies, application of advanced monitoring technologies and extensive interpretation of multiple databases.   Critical Care Time devoted to patient care services described in this note is  35  Minutes. This time reflects time of care of this signee Dr [name]. This critical care time does not reflect procedure time, or teaching time or supervisory time of PA/NP/Med student/Med Resident etc but could involve care discussion time.  Rush Farmer, M.D. [facility] Pulmonary/Critical Care Medicine. Pager: [phone]. After hours pager: [phone].

## 2015-07-24 NOTE — Progress Notes (Signed)
Pharmacy Antibiotic Note  Joseph [name] is a 70 y.o. male s/p redo CABG on 6/9 who continues on vancomycin and zosyn for open sternum. Now s/p sternal closure today. CRRT was restarted today and VR is supratherapeutic at 35 with last vancomycin 1g dose given yesterday at 1135.  Plan: Continue to hold vancomycin  Primaxin 500mg  IV q8h Monitor culture data, CRRT plans and clinical course VR tomorrow  Height: 5\' 11"  (180.3 cm) Weight: 240 lb 15.4 oz (109.3 kg) IBW/kg (Calculated) : 75.3  Temp (24hrs), Avg:100.7 F (38.2 C), Min:98.1 F (36.7 C), Max:102.9 F (39.4 C)   Recent Labs Lab 07/21/15 0358  07/21/15 0920  07/21/15 1630  07/22/15 0402  07/22/2015 0405 07/08/2015 0611 07/24/2015 0615 07/12/2015 0830 07/10/2015 1100 08/06/2015 1603 07/24/15 0430 07/24/15 1130  WBC 17.9*  --   --   --  18.2*  --  19.0*  --  18.9*  --   --   --  19.3*  --  21.3*  --   CREATININE 2.75*  < >  --   < > 2.95*  < > 2.59*  < > 2.43*  2.37* 2.70* 2.10* 2.80*  --  3.18* 4.33*  --   VANCOTROUGH  --   --  25*  --   --   --   --   --   --   --   --   --   --   --   --   --   VANCORANDOM 28  --   --   --   --   --   --   --   --   --   --   --   --   --   --  35  < > = values in this interval not displayed.  Estimated Creatinine Clearance: 20.8 mL/min (by C-G formula based on Cr of 4.33).    Allergies  Allergen Reactions  . Sulfa Antibiotics Hives and Itching  . Morphine And Related     Makes patient feel weird    Antimicrobials this admission: 6/8 Vanc >> (Rx dosing 6/11) 6/9 Zosyn>>6/17 6/17 Primaxin>>  Dose adjustments this admission: 6/10 AM VR (CVTS ordered): 26 (only received 2 doses, CRRT off x 10 hours when level drawn) 6/11 AM VR (CVTS): 14 (no dose 6/10) - start 1250mg  q24 6/14 VT = 25 on 1250mg  q24 with CRRT (drawn ~ 1 hour early) 6/17 VR: 35 after vancomycin 1g 6/16 at 1135 (hold and recheck tomorrow AM)  Microbiology results: 6/10 TA: negative, final 6/7 surg mrsa pcr:  neg   Andrey Cota. Diona Foley, PharmD, BCPS Clinical Pharmacist Pager [phone] 07/24/2015 12:14 PM

## 2015-07-24 NOTE — Progress Notes (Signed)
1 Day Post-Op Procedure(s) (LRB): STERNAL CLOSURE WITH PUMP STANDBY (N/A) TRANSESOPHAGEAL ECHOCARDIOGRAM (TEE) (N/A) Subjective:  Sedated on vent.  Objective: Vital signs in last 24 hours: Temp:  [99.1 F (37.3 C)-102.9 F (39.4 C)] 99.3 F (37.4 C) (06/17 0930) Pulse Rate:  [87-101] 89 (06/17 0930) Cardiac Rhythm:  [-] Normal sinus rhythm (06/17 0845) Resp:  [0-37] 35 (06/17 0930) BP: (76-120)/(41-57) 112/57 mmHg (06/17 0835) SpO2:  [92 %-100 %] 100 % (06/17 0930) Arterial Line BP: (75-152)/(41-61) 140/43 mmHg (06/17 0930) FiO2 (%):  [60 %-80 %] 60 % (06/17 0930) Weight:  [109.3 kg (240 lb 15.4 oz)] 109.3 kg (240 lb 15.4 oz) (06/17 0500)  Hemodynamic parameters for last 24 hours: PAP: (31-43)/(23-36) 40/31 mmHg CVP:  [15 mmHg-26 mmHg] 23 mmHg CO:  [8.8 L/min-10.2 L/min] 8.8 L/min CI:  [4.1 L/min/m2-4.8 L/min/m2] 4.1 L/min/m2  Intake/Output from previous day: 06/16 0701 - 06/17 0700 In: 5423.6 [I.V.:3181.6; Blood:762; NG/GT:530; IV Piggyback:950] Out: 450 [Blood:100; Chest Tube:290] Intake/Output this shift: Total I/O In: 368.2 [I.V.:131.5; Blood:196.7; NG/GT:40] Out: -   General appearance: jaundiced, anasarca Neurologic: sedated on vent Heart: regular rate and rhythm, S1, S2 normal, no murmur, click, rub or gallop Lungs: clear to auscultation bilaterally Abdomen: soft, bowel sounds hypoactive, high tube feed residuals. Extremities: warm Wound: dressing dry small air leak from the chest tube.  Lab Results:  Recent Labs  08/03/2015 1100 07/24/15 0430  WBC 19.3* 21.3*  HGB 8.0*  8.8* 8.4*  HCT 24.0*  26.0* 25.4*  PLT 60* 90*   BMET:  Recent Labs  07/14/2015 1603 07/24/15 0430  NA 139 137  K 4.5 4.7  CL 95* 92*  CO2 31 29  GLUCOSE 125* 131*  BUN 53* 75*  CREATININE 3.18* 4.33*  CALCIUM 9.0 8.5*    PT/INR:  Recent Labs  07/24/15 0430  LABPROT 23.4*  INR 2.10*   ABG    Component Value Date/Time   PHART 7.538* 07/24/2015 0418   HCO3 31.9*  07/24/2015 0418   TCO2 33 07/24/2015 0418   ACIDBASEDEF 0.6 07/21/2015 0415   O2SAT 65.6 07/24/2015 0558   CBG (last 3)   Recent Labs  07/24/15 0522 07/24/15 0557 07/24/15 0654  GLUCAP 135* 129* 132*   CLINICAL DATA: Status post CABG  EXAM: PORTABLE CHEST 1 VIEW  COMPARISON: Chest radiograph from one day prior.  FINDINGS: Endotracheal tube tip is 4.9 cm above the carina. Enteric tube terminates in the gastric fundus. Left internal jugular Swan-Ganz catheter terminates in the main pulmonary artery. Left subclavian central venous catheter terminates in the lower third of the superior vena cava. Stable position of the bilateral chest tubes. Sternotomy wires appear aligned and intact. Skin staples overlie the midline chest. Stable cardiomediastinal silhouette with mild cardiomegaly. No pneumothorax. No pleural effusion. No overt pulmonary edema. Mild bibasilar atelectasis, decreased.  IMPRESSION: 1. Support structures as described. 2. No pneumothorax. 3. Mild bibasilar atelectasis, decreased. 4. Stable mild cardiomegaly without overt pulmonary edema.   Electronically Signed  By: Ilona Sorrel M.D.  On: 07/24/2015 07:54  Assessment/Plan: S/P Procedure(s) (LRB): STERNAL CLOSURE WITH PUMP STANDBY (N/A) TRANSESOPHAGEAL ECHOCARDIOGRAM (TEE) (N/A)  1. CV: Hemodynamically stable on milrinone 0.125 and epi 15. Levophed is off. CI 4.0 and Co-ox 66%. IABP weaned and ready for removal once INR corrected with FFP.  2. VDRF: continue present support for now.  3. Renal: ESRD. CRRT to resume this am.  4. ID: Septic profile with high CO, procalcitonin 12.6 and WBC 21.3. Repeat cultures from yesterday  pending. Arterial line changed today. Will remove old swan and sleeve. Left subclavian line is new from yesterday. Has old left femoral dialysis catheter. On Zosyn and diflucan. Will discuss adding vancomycin.  5. Nutrition: tube feeds held this am due to high residuals and  he has hypoactive bowel sounds. Will monitor. TNA ordered.   LOS: 18 days    Gaye Pollack 07/24/2015

## 2015-07-24 NOTE — Progress Notes (Signed)
Patient ID: Joseph [name], male   DOB: 16-Apr-1945, 70 y.o.   MRN: [id number]  SICU Evening Rounds:  Hemodynamically stable  CVP 15 On Milrinone 0.125, epi down to 11 mcg  IABP removed today.  Sats 99% on 50% FiO2.  Remains afebrile, no new culture results.  Plan to check vanc trough in the am before re-dosing.  BMP Latest Ref Rng 07/24/2015 07/24/2015 07/24/2015  Glucose 65 - 99 mg/dL 132(H) 175(H) 135(H)  BUN 6 - 20 mg/dL 79(H) 57(H) 68(H)  Creatinine 0.61 - 1.24 mg/dL 4.00(H) 3.10(H) 4.20(H)  Sodium 135 - 145 mmol/L 137 139 137  Potassium 3.5 - 5.1 mmol/L 3.8 3.7 3.8  Chloride 101 - 111 mmol/L 93(L) 91(L) 92(L)  CO2 22 - 32 mmol/L 29 - -  Calcium 8.9 - 10.3 mg/dL 8.8(L) - -   CBC Latest Ref Rng 07/24/2015 07/24/2015 07/24/2015  WBC 4.0 - 10.5 K/uL - - -  Hemoglobin 13.0 - 17.0 g/dL 10.5(L) 9.9(L) 9.5(L)  Hematocrit 39.0 - 52.0 % 31.0(L) 29.0(L) 28.0(L)  Platelets 150 - 400 K/uL - - -   CRRT is running. Removing 100 cc/hr.

## 2015-07-25 ENCOUNTER — Inpatient Hospital Stay (HOSPITAL_COMMUNITY): Payer: Medicare Other

## 2015-07-25 LAB — GLUCOSE, CAPILLARY
GLUCOSE-CAPILLARY: 161 mg/dL — AB (ref 65–99)
GLUCOSE-CAPILLARY: 138 mg/dL — AB (ref 65–99)
GLUCOSE-CAPILLARY: 149 mg/dL — AB (ref 65–99)
GLUCOSE-CAPILLARY: 137 mg/dL — AB (ref 65–99)
GLUCOSE-CAPILLARY: 136 mg/dL — AB (ref 65–99)
GLUCOSE-CAPILLARY: 149 mg/dL — AB (ref 65–99)
Glucose-Capillary: 127 mg/dL — ABNORMAL HIGH (ref 65–99)
Glucose-Capillary: 135 mg/dL — ABNORMAL HIGH (ref 65–99)
Glucose-Capillary: 136 mg/dL — ABNORMAL HIGH (ref 65–99)
Glucose-Capillary: 136 mg/dL — ABNORMAL HIGH (ref 65–99)
Glucose-Capillary: 137 mg/dL — ABNORMAL HIGH (ref 65–99)
Glucose-Capillary: 140 mg/dL — ABNORMAL HIGH (ref 65–99)
Glucose-Capillary: 141 mg/dL — ABNORMAL HIGH (ref 65–99)
Glucose-Capillary: 141 mg/dL — ABNORMAL HIGH (ref 65–99)
Glucose-Capillary: 144 mg/dL — ABNORMAL HIGH (ref 65–99)
Glucose-Capillary: 144 mg/dL — ABNORMAL HIGH (ref 65–99)
Glucose-Capillary: 146 mg/dL — ABNORMAL HIGH (ref 65–99)

## 2015-07-25 LAB — POCT I-STAT, CHEM 8
BUN: 38 mg/dL — AB (ref 6–20)
BUN: 40 mg/dL — ABNORMAL HIGH (ref 6–20)
BUN: 40 mg/dL — ABNORMAL HIGH (ref 6–20)
BUN: 41 mg/dL — ABNORMAL HIGH (ref 6–20)
BUN: 43 mg/dL — AB (ref 6–20)
BUN: 43 mg/dL — ABNORMAL HIGH (ref 6–20)
BUN: 43 mg/dL — ABNORMAL HIGH (ref 6–20)
BUN: 43 mg/dL — ABNORMAL HIGH (ref 6–20)
BUN: 44 mg/dL — ABNORMAL HIGH (ref 6–20)
BUN: 46 mg/dL — AB (ref 6–20)
BUN: 46 mg/dL — AB (ref 6–20)
BUN: 46 mg/dL — ABNORMAL HIGH (ref 6–20)
BUN: 47 mg/dL — AB (ref 6–20)
BUN: 48 mg/dL — AB (ref 6–20)
BUN: 48 mg/dL — AB (ref 6–20)
BUN: 48 mg/dL — AB (ref 6–20)
BUN: 49 mg/dL — AB (ref 6–20)
BUN: 49 mg/dL — AB (ref 6–20)
BUN: 49 mg/dL — AB (ref 6–20)
BUN: 49 mg/dL — ABNORMAL HIGH (ref 6–20)
BUN: 50 mg/dL — ABNORMAL HIGH (ref 6–20)
BUN: 50 mg/dL — ABNORMAL HIGH (ref 6–20)
BUN: 50 mg/dL — ABNORMAL HIGH (ref 6–20)
BUN: 51 mg/dL — AB (ref 6–20)
BUN: 51 mg/dL — ABNORMAL HIGH (ref 6–20)
BUN: 52 mg/dL — AB (ref 6–20)
BUN: 53 mg/dL — AB (ref 6–20)
BUN: 53 mg/dL — ABNORMAL HIGH (ref 6–20)
BUN: 54 mg/dL — ABNORMAL HIGH (ref 6–20)
BUN: 55 mg/dL — AB (ref 6–20)
BUN: 55 mg/dL — AB (ref 6–20)
BUN: 55 mg/dL — ABNORMAL HIGH (ref 6–20)
BUN: 57 mg/dL — AB (ref 6–20)
BUN: 57 mg/dL — ABNORMAL HIGH (ref 6–20)
BUN: 58 mg/dL — ABNORMAL HIGH (ref 6–20)
BUN: 59 mg/dL — AB (ref 6–20)
BUN: 60 mg/dL — ABNORMAL HIGH (ref 6–20)
BUN: 60 mg/dL — ABNORMAL HIGH (ref 6–20)
BUN: 63 mg/dL — ABNORMAL HIGH (ref 6–20)
BUN: 66 mg/dL — AB (ref 6–20)
CALCIUM ION: 0.3 mmol/L — AB (ref 1.13–1.30)
CALCIUM ION: 0.34 mmol/L — AB (ref 1.13–1.30)
CALCIUM ION: 0.36 mmol/L — AB (ref 1.13–1.30)
CALCIUM ION: 0.36 mmol/L — AB (ref 1.13–1.30)
CALCIUM ION: 0.37 mmol/L — AB (ref 1.13–1.30)
CALCIUM ION: 0.39 mmol/L — AB (ref 1.13–1.30)
CALCIUM ION: 0.41 mmol/L — AB (ref 1.13–1.30)
CALCIUM ION: 0.42 mmol/L — AB (ref 1.13–1.30)
CALCIUM ION: 0.43 mmol/L — AB (ref 1.13–1.30)
CALCIUM ION: 0.44 mmol/L — AB (ref 1.13–1.30)
CALCIUM ION: 0.44 mmol/L — AB (ref 1.13–1.30)
CALCIUM ION: 0.44 mmol/L — AB (ref 1.13–1.30)
CALCIUM ION: 0.45 mmol/L — AB (ref 1.13–1.30)
CALCIUM ION: 0.45 mmol/L — AB (ref 1.13–1.30)
CALCIUM ION: 0.48 mmol/L — AB (ref 1.13–1.30)
CALCIUM ION: 0.48 mmol/L — AB (ref 1.13–1.30)
CALCIUM ION: 0.95 mmol/L — AB (ref 1.13–1.30)
CALCIUM ION: 0.96 mmol/L — AB (ref 1.13–1.30)
CALCIUM ION: 0.96 mmol/L — AB (ref 1.13–1.30)
CALCIUM ION: 0.96 mmol/L — AB (ref 1.13–1.30)
CALCIUM ION: 0.97 mmol/L — AB (ref 1.13–1.30)
CALCIUM ION: 0.97 mmol/L — AB (ref 1.13–1.30)
CALCIUM ION: 0.97 mmol/L — AB (ref 1.13–1.30)
CALCIUM ION: 0.98 mmol/L — AB (ref 1.13–1.30)
CALCIUM ION: 0.99 mmol/L — AB (ref 1.13–1.30)
CALCIUM ION: 1 mmol/L — AB (ref 1.13–1.30)
CALCIUM ION: 1.01 mmol/L — AB (ref 1.13–1.30)
CALCIUM ION: 1.01 mmol/L — AB (ref 1.13–1.30)
CALCIUM ION: 1.01 mmol/L — AB (ref 1.13–1.30)
CALCIUM ION: 1.02 mmol/L — AB (ref 1.13–1.30)
CHLORIDE: 82 mmol/L — AB (ref 101–111)
CHLORIDE: 82 mmol/L — AB (ref 101–111)
CHLORIDE: 82 mmol/L — AB (ref 101–111)
CHLORIDE: 83 mmol/L — AB (ref 101–111)
CHLORIDE: 84 mmol/L — AB (ref 101–111)
CHLORIDE: 85 mmol/L — AB (ref 101–111)
CHLORIDE: 85 mmol/L — AB (ref 101–111)
CHLORIDE: 85 mmol/L — AB (ref 101–111)
CHLORIDE: 86 mmol/L — AB (ref 101–111)
CHLORIDE: 87 mmol/L — AB (ref 101–111)
CHLORIDE: 87 mmol/L — AB (ref 101–111)
CHLORIDE: 88 mmol/L — AB (ref 101–111)
CHLORIDE: 88 mmol/L — AB (ref 101–111)
CHLORIDE: 88 mmol/L — AB (ref 101–111)
CHLORIDE: 88 mmol/L — AB (ref 101–111)
CHLORIDE: 89 mmol/L — AB (ref 101–111)
CHLORIDE: 89 mmol/L — AB (ref 101–111)
CHLORIDE: 90 mmol/L — AB (ref 101–111)
CHLORIDE: 91 mmol/L — AB (ref 101–111)
CREATININE: 1.9 mg/dL — AB (ref 0.61–1.24)
CREATININE: 2 mg/dL — AB (ref 0.61–1.24)
CREATININE: 2.2 mg/dL — AB (ref 0.61–1.24)
CREATININE: 2.3 mg/dL — AB (ref 0.61–1.24)
CREATININE: 2.5 mg/dL — AB (ref 0.61–1.24)
CREATININE: 2.5 mg/dL — AB (ref 0.61–1.24)
CREATININE: 2.6 mg/dL — AB (ref 0.61–1.24)
CREATININE: 2.6 mg/dL — AB (ref 0.61–1.24)
CREATININE: 2.6 mg/dL — AB (ref 0.61–1.24)
CREATININE: 2.6 mg/dL — AB (ref 0.61–1.24)
CREATININE: 2.7 mg/dL — AB (ref 0.61–1.24)
CREATININE: 2.7 mg/dL — AB (ref 0.61–1.24)
CREATININE: 2.8 mg/dL — AB (ref 0.61–1.24)
CREATININE: 2.9 mg/dL — AB (ref 0.61–1.24)
CREATININE: 2.9 mg/dL — AB (ref 0.61–1.24)
CREATININE: 3.1 mg/dL — AB (ref 0.61–1.24)
CREATININE: 3.4 mg/dL — AB (ref 0.61–1.24)
CREATININE: 3.5 mg/dL — AB (ref 0.61–1.24)
CREATININE: 3.6 mg/dL — AB (ref 0.61–1.24)
CREATININE: 3.6 mg/dL — AB (ref 0.61–1.24)
CREATININE: 3.8 mg/dL — AB (ref 0.61–1.24)
CREATININE: 3.9 mg/dL — AB (ref 0.61–1.24)
Calcium, Ion: 0.42 mmol/L — CL (ref 1.13–1.30)
Calcium, Ion: 0.42 mmol/L — CL (ref 1.13–1.30)
Calcium, Ion: 0.43 mmol/L — CL (ref 1.13–1.30)
Calcium, Ion: 0.47 mmol/L — CL (ref 1.13–1.30)
Calcium, Ion: 0.98 mmol/L — ABNORMAL LOW (ref 1.13–1.30)
Calcium, Ion: 0.98 mmol/L — ABNORMAL LOW (ref 1.13–1.30)
Calcium, Ion: 0.98 mmol/L — ABNORMAL LOW (ref 1.13–1.30)
Calcium, Ion: 0.99 mmol/L — ABNORMAL LOW (ref 1.13–1.30)
Calcium, Ion: 1.01 mmol/L — ABNORMAL LOW (ref 1.13–1.30)
Calcium, Ion: 1.02 mmol/L — ABNORMAL LOW (ref 1.13–1.30)
Chloride: 83 mmol/L — ABNORMAL LOW (ref 101–111)
Chloride: 83 mmol/L — ABNORMAL LOW (ref 101–111)
Chloride: 83 mmol/L — ABNORMAL LOW (ref 101–111)
Chloride: 84 mmol/L — ABNORMAL LOW (ref 101–111)
Chloride: 85 mmol/L — ABNORMAL LOW (ref 101–111)
Chloride: 85 mmol/L — ABNORMAL LOW (ref 101–111)
Chloride: 86 mmol/L — ABNORMAL LOW (ref 101–111)
Chloride: 86 mmol/L — ABNORMAL LOW (ref 101–111)
Chloride: 86 mmol/L — ABNORMAL LOW (ref 101–111)
Chloride: 86 mmol/L — ABNORMAL LOW (ref 101–111)
Chloride: 87 mmol/L — ABNORMAL LOW (ref 101–111)
Chloride: 88 mmol/L — ABNORMAL LOW (ref 101–111)
Chloride: 88 mmol/L — ABNORMAL LOW (ref 101–111)
Chloride: 88 mmol/L — ABNORMAL LOW (ref 101–111)
Chloride: 88 mmol/L — ABNORMAL LOW (ref 101–111)
Chloride: 88 mmol/L — ABNORMAL LOW (ref 101–111)
Chloride: 89 mmol/L — ABNORMAL LOW (ref 101–111)
Chloride: 90 mmol/L — ABNORMAL LOW (ref 101–111)
Chloride: 90 mmol/L — ABNORMAL LOW (ref 101–111)
Chloride: 90 mmol/L — ABNORMAL LOW (ref 101–111)
Chloride: 91 mmol/L — ABNORMAL LOW (ref 101–111)
Creatinine, Ser: 2.1 mg/dL — ABNORMAL HIGH (ref 0.61–1.24)
Creatinine, Ser: 2.2 mg/dL — ABNORMAL HIGH (ref 0.61–1.24)
Creatinine, Ser: 2.4 mg/dL — ABNORMAL HIGH (ref 0.61–1.24)
Creatinine, Ser: 2.5 mg/dL — ABNORMAL HIGH (ref 0.61–1.24)
Creatinine, Ser: 2.5 mg/dL — ABNORMAL HIGH (ref 0.61–1.24)
Creatinine, Ser: 2.6 mg/dL — ABNORMAL HIGH (ref 0.61–1.24)
Creatinine, Ser: 2.6 mg/dL — ABNORMAL HIGH (ref 0.61–1.24)
Creatinine, Ser: 2.7 mg/dL — ABNORMAL HIGH (ref 0.61–1.24)
Creatinine, Ser: 2.8 mg/dL — ABNORMAL HIGH (ref 0.61–1.24)
Creatinine, Ser: 2.8 mg/dL — ABNORMAL HIGH (ref 0.61–1.24)
Creatinine, Ser: 2.9 mg/dL — ABNORMAL HIGH (ref 0.61–1.24)
Creatinine, Ser: 3 mg/dL — ABNORMAL HIGH (ref 0.61–1.24)
Creatinine, Ser: 3.1 mg/dL — ABNORMAL HIGH (ref 0.61–1.24)
Creatinine, Ser: 3.2 mg/dL — ABNORMAL HIGH (ref 0.61–1.24)
Creatinine, Ser: 3.2 mg/dL — ABNORMAL HIGH (ref 0.61–1.24)
Creatinine, Ser: 3.4 mg/dL — ABNORMAL HIGH (ref 0.61–1.24)
Creatinine, Ser: 3.6 mg/dL — ABNORMAL HIGH (ref 0.61–1.24)
Creatinine, Ser: 3.7 mg/dL — ABNORMAL HIGH (ref 0.61–1.24)
GLUCOSE: 133 mg/dL — AB (ref 65–99)
GLUCOSE: 135 mg/dL — AB (ref 65–99)
GLUCOSE: 139 mg/dL — AB (ref 65–99)
GLUCOSE: 142 mg/dL — AB (ref 65–99)
GLUCOSE: 142 mg/dL — AB (ref 65–99)
GLUCOSE: 143 mg/dL — AB (ref 65–99)
GLUCOSE: 145 mg/dL — AB (ref 65–99)
GLUCOSE: 146 mg/dL — AB (ref 65–99)
GLUCOSE: 146 mg/dL — AB (ref 65–99)
GLUCOSE: 146 mg/dL — AB (ref 65–99)
GLUCOSE: 147 mg/dL — AB (ref 65–99)
GLUCOSE: 147 mg/dL — AB (ref 65–99)
GLUCOSE: 148 mg/dL — AB (ref 65–99)
GLUCOSE: 149 mg/dL — AB (ref 65–99)
GLUCOSE: 150 mg/dL — AB (ref 65–99)
GLUCOSE: 194 mg/dL — AB (ref 65–99)
GLUCOSE: 195 mg/dL — AB (ref 65–99)
GLUCOSE: 198 mg/dL — AB (ref 65–99)
GLUCOSE: 201 mg/dL — AB (ref 65–99)
GLUCOSE: 202 mg/dL — AB (ref 65–99)
GLUCOSE: 203 mg/dL — AB (ref 65–99)
GLUCOSE: 203 mg/dL — AB (ref 65–99)
GLUCOSE: 204 mg/dL — AB (ref 65–99)
GLUCOSE: 206 mg/dL — AB (ref 65–99)
GLUCOSE: 208 mg/dL — AB (ref 65–99)
GLUCOSE: 209 mg/dL — AB (ref 65–99)
GLUCOSE: 214 mg/dL — AB (ref 65–99)
GLUCOSE: 217 mg/dL — AB (ref 65–99)
GLUCOSE: 218 mg/dL — AB (ref 65–99)
GLUCOSE: 220 mg/dL — AB (ref 65–99)
Glucose, Bld: 149 mg/dL — ABNORMAL HIGH (ref 65–99)
Glucose, Bld: 195 mg/dL — ABNORMAL HIGH (ref 65–99)
Glucose, Bld: 149 mg/dL — ABNORMAL HIGH (ref 65–99)
Glucose, Bld: 197 mg/dL — ABNORMAL HIGH (ref 65–99)
Glucose, Bld: 203 mg/dL — ABNORMAL HIGH (ref 65–99)
Glucose, Bld: 147 mg/dL — ABNORMAL HIGH (ref 65–99)
Glucose, Bld: 209 mg/dL — ABNORMAL HIGH (ref 65–99)
Glucose, Bld: 145 mg/dL — ABNORMAL HIGH (ref 65–99)
Glucose, Bld: 206 mg/dL — ABNORMAL HIGH (ref 65–99)
Glucose, Bld: 154 mg/dL — ABNORMAL HIGH (ref 65–99)
HCT: 27 % — ABNORMAL LOW (ref 39.0–52.0)
HCT: 27 % — ABNORMAL LOW (ref 39.0–52.0)
HCT: 27 % — ABNORMAL LOW (ref 39.0–52.0)
HCT: 27 % — ABNORMAL LOW (ref 39.0–52.0)
HCT: 28 % — ABNORMAL LOW (ref 39.0–52.0)
HCT: 28 % — ABNORMAL LOW (ref 39.0–52.0)
HCT: 28 % — ABNORMAL LOW (ref 39.0–52.0)
HCT: 28 % — ABNORMAL LOW (ref 39.0–52.0)
HCT: 29 % — ABNORMAL LOW (ref 39.0–52.0)
HCT: 29 % — ABNORMAL LOW (ref 39.0–52.0)
HCT: 29 % — ABNORMAL LOW (ref 39.0–52.0)
HCT: 29 % — ABNORMAL LOW (ref 39.0–52.0)
HCT: 30 % — ABNORMAL LOW (ref 39.0–52.0)
HCT: 30 % — ABNORMAL LOW (ref 39.0–52.0)
HCT: 30 % — ABNORMAL LOW (ref 39.0–52.0)
HCT: 31 % — ABNORMAL LOW (ref 39.0–52.0)
HCT: 31 % — ABNORMAL LOW (ref 39.0–52.0)
HCT: 31 % — ABNORMAL LOW (ref 39.0–52.0)
HCT: 31 % — ABNORMAL LOW (ref 39.0–52.0)
HCT: 31 % — ABNORMAL LOW (ref 39.0–52.0)
HCT: 31 % — ABNORMAL LOW (ref 39.0–52.0)
HCT: 32 % — ABNORMAL LOW (ref 39.0–52.0)
HCT: 33 % — ABNORMAL LOW (ref 39.0–52.0)
HCT: 33 % — ABNORMAL LOW (ref 39.0–52.0)
HCT: 34 % — ABNORMAL LOW (ref 39.0–52.0)
HEMATOCRIT: 26 % — AB (ref 39.0–52.0)
HEMATOCRIT: 30 % — AB (ref 39.0–52.0)
HEMATOCRIT: 26 % — AB (ref 39.0–52.0)
HEMATOCRIT: 30 % — AB (ref 39.0–52.0)
HEMATOCRIT: 30 % — AB (ref 39.0–52.0)
HEMATOCRIT: 33 % — AB (ref 39.0–52.0)
HEMATOCRIT: 29 % — AB (ref 39.0–52.0)
HEMATOCRIT: 32 % — AB (ref 39.0–52.0)
HEMATOCRIT: 30 % — AB (ref 39.0–52.0)
HEMATOCRIT: 30 % — AB (ref 39.0–52.0)
HEMATOCRIT: 32 % — AB (ref 39.0–52.0)
HEMATOCRIT: 31 % — AB (ref 39.0–52.0)
HEMATOCRIT: 30 % — AB (ref 39.0–52.0)
HEMATOCRIT: 33 % — AB (ref 39.0–52.0)
HEMATOCRIT: 28 % — AB (ref 39.0–52.0)
HEMOGLOBIN: 10.2 g/dL — AB (ref 13.0–17.0)
HEMOGLOBIN: 10.2 g/dL — AB (ref 13.0–17.0)
HEMOGLOBIN: 10.5 g/dL — AB (ref 13.0–17.0)
HEMOGLOBIN: 10.5 g/dL — AB (ref 13.0–17.0)
HEMOGLOBIN: 10.5 g/dL — AB (ref 13.0–17.0)
HEMOGLOBIN: 10.5 g/dL — AB (ref 13.0–17.0)
HEMOGLOBIN: 10.5 g/dL — AB (ref 13.0–17.0)
HEMOGLOBIN: 10.9 g/dL — AB (ref 13.0–17.0)
HEMOGLOBIN: 11.2 g/dL — AB (ref 13.0–17.0)
HEMOGLOBIN: 8.8 g/dL — AB (ref 13.0–17.0)
HEMOGLOBIN: 9.2 g/dL — AB (ref 13.0–17.0)
HEMOGLOBIN: 9.5 g/dL — AB (ref 13.0–17.0)
HEMOGLOBIN: 9.5 g/dL — AB (ref 13.0–17.0)
HEMOGLOBIN: 9.9 g/dL — AB (ref 13.0–17.0)
HEMOGLOBIN: 9.9 g/dL — AB (ref 13.0–17.0)
HEMOGLOBIN: 9.9 g/dL — AB (ref 13.0–17.0)
HEMOGLOBIN: 9.9 g/dL — AB (ref 13.0–17.0)
Hemoglobin: 10.2 g/dL — ABNORMAL LOW (ref 13.0–17.0)
Hemoglobin: 10.2 g/dL — ABNORMAL LOW (ref 13.0–17.0)
Hemoglobin: 10.2 g/dL — ABNORMAL LOW (ref 13.0–17.0)
Hemoglobin: 10.2 g/dL — ABNORMAL LOW (ref 13.0–17.0)
Hemoglobin: 10.2 g/dL — ABNORMAL LOW (ref 13.0–17.0)
Hemoglobin: 10.2 g/dL — ABNORMAL LOW (ref 13.0–17.0)
Hemoglobin: 10.2 g/dL — ABNORMAL LOW (ref 13.0–17.0)
Hemoglobin: 10.5 g/dL — ABNORMAL LOW (ref 13.0–17.0)
Hemoglobin: 10.5 g/dL — ABNORMAL LOW (ref 13.0–17.0)
Hemoglobin: 10.9 g/dL — ABNORMAL LOW (ref 13.0–17.0)
Hemoglobin: 10.9 g/dL — ABNORMAL LOW (ref 13.0–17.0)
Hemoglobin: 11.2 g/dL — ABNORMAL LOW (ref 13.0–17.0)
Hemoglobin: 11.2 g/dL — ABNORMAL LOW (ref 13.0–17.0)
Hemoglobin: 11.2 g/dL — ABNORMAL LOW (ref 13.0–17.0)
Hemoglobin: 11.6 g/dL — ABNORMAL LOW (ref 13.0–17.0)
Hemoglobin: 8.8 g/dL — ABNORMAL LOW (ref 13.0–17.0)
Hemoglobin: 9.2 g/dL — ABNORMAL LOW (ref 13.0–17.0)
Hemoglobin: 9.2 g/dL — ABNORMAL LOW (ref 13.0–17.0)
Hemoglobin: 9.2 g/dL — ABNORMAL LOW (ref 13.0–17.0)
Hemoglobin: 9.5 g/dL — ABNORMAL LOW (ref 13.0–17.0)
Hemoglobin: 9.5 g/dL — ABNORMAL LOW (ref 13.0–17.0)
Hemoglobin: 9.5 g/dL — ABNORMAL LOW (ref 13.0–17.0)
Hemoglobin: 9.9 g/dL — ABNORMAL LOW (ref 13.0–17.0)
POTASSIUM: 3.6 mmol/L (ref 3.5–5.1)
POTASSIUM: 3.6 mmol/L (ref 3.5–5.1)
POTASSIUM: 3.6 mmol/L (ref 3.5–5.1)
POTASSIUM: 3.6 mmol/L (ref 3.5–5.1)
POTASSIUM: 3.6 mmol/L (ref 3.5–5.1)
POTASSIUM: 3.6 mmol/L (ref 3.5–5.1)
POTASSIUM: 3.6 mmol/L (ref 3.5–5.1)
POTASSIUM: 3.6 mmol/L (ref 3.5–5.1)
POTASSIUM: 3.6 mmol/L (ref 3.5–5.1)
POTASSIUM: 3.6 mmol/L (ref 3.5–5.1)
POTASSIUM: 3.6 mmol/L (ref 3.5–5.1)
POTASSIUM: 3.6 mmol/L (ref 3.5–5.1)
POTASSIUM: 3.7 mmol/L (ref 3.5–5.1)
POTASSIUM: 3.7 mmol/L (ref 3.5–5.1)
POTASSIUM: 3.7 mmol/L (ref 3.5–5.1)
POTASSIUM: 3.7 mmol/L (ref 3.5–5.1)
POTASSIUM: 3.7 mmol/L (ref 3.5–5.1)
POTASSIUM: 3.7 mmol/L (ref 3.5–5.1)
POTASSIUM: 3.7 mmol/L (ref 3.5–5.1)
POTASSIUM: 3.7 mmol/L (ref 3.5–5.1)
POTASSIUM: 3.8 mmol/L (ref 3.5–5.1)
POTASSIUM: 3.9 mmol/L (ref 3.5–5.1)
Potassium: 3.6 mmol/L (ref 3.5–5.1)
Potassium: 3.6 mmol/L (ref 3.5–5.1)
Potassium: 3.6 mmol/L (ref 3.5–5.1)
Potassium: 3.6 mmol/L (ref 3.5–5.1)
Potassium: 3.6 mmol/L (ref 3.5–5.1)
Potassium: 3.7 mmol/L (ref 3.5–5.1)
Potassium: 3.7 mmol/L (ref 3.5–5.1)
Potassium: 3.7 mmol/L (ref 3.5–5.1)
Potassium: 3.7 mmol/L (ref 3.5–5.1)
Potassium: 3.7 mmol/L (ref 3.5–5.1)
Potassium: 3.7 mmol/L (ref 3.5–5.1)
Potassium: 3.7 mmol/L (ref 3.5–5.1)
Potassium: 3.8 mmol/L (ref 3.5–5.1)
Potassium: 3.8 mmol/L (ref 3.5–5.1)
Potassium: 3.8 mmol/L (ref 3.5–5.1)
Potassium: 3.8 mmol/L (ref 3.5–5.1)
Potassium: 3.9 mmol/L (ref 3.5–5.1)
Potassium: 3.9 mmol/L (ref 3.5–5.1)
SODIUM: 137 mmol/L (ref 135–145)
SODIUM: 137 mmol/L (ref 135–145)
SODIUM: 137 mmol/L (ref 135–145)
SODIUM: 137 mmol/L (ref 135–145)
SODIUM: 137 mmol/L (ref 135–145)
SODIUM: 137 mmol/L (ref 135–145)
SODIUM: 137 mmol/L (ref 135–145)
SODIUM: 137 mmol/L (ref 135–145)
SODIUM: 138 mmol/L (ref 135–145)
SODIUM: 138 mmol/L (ref 135–145)
SODIUM: 139 mmol/L (ref 135–145)
SODIUM: 139 mmol/L (ref 135–145)
SODIUM: 139 mmol/L (ref 135–145)
SODIUM: 139 mmol/L (ref 135–145)
SODIUM: 139 mmol/L (ref 135–145)
SODIUM: 139 mmol/L (ref 135–145)
Sodium: 136 mmol/L (ref 135–145)
Sodium: 136 mmol/L (ref 135–145)
Sodium: 136 mmol/L (ref 135–145)
Sodium: 136 mmol/L (ref 135–145)
Sodium: 136 mmol/L (ref 135–145)
Sodium: 136 mmol/L (ref 135–145)
Sodium: 137 mmol/L (ref 135–145)
Sodium: 137 mmol/L (ref 135–145)
Sodium: 137 mmol/L (ref 135–145)
Sodium: 137 mmol/L (ref 135–145)
Sodium: 137 mmol/L (ref 135–145)
Sodium: 137 mmol/L (ref 135–145)
Sodium: 137 mmol/L (ref 135–145)
Sodium: 137 mmol/L (ref 135–145)
Sodium: 138 mmol/L (ref 135–145)
Sodium: 138 mmol/L (ref 135–145)
Sodium: 138 mmol/L (ref 135–145)
Sodium: 138 mmol/L (ref 135–145)
Sodium: 139 mmol/L (ref 135–145)
Sodium: 139 mmol/L (ref 135–145)
Sodium: 139 mmol/L (ref 135–145)
Sodium: 139 mmol/L (ref 135–145)
Sodium: 139 mmol/L (ref 135–145)
Sodium: 139 mmol/L (ref 135–145)
TCO2: 31 mmol/L (ref 0–100)
TCO2: 32 mmol/L (ref 0–100)
TCO2: 32 mmol/L (ref 0–100)
TCO2: 32 mmol/L (ref 0–100)
TCO2: 32 mmol/L (ref 0–100)
TCO2: 32 mmol/L (ref 0–100)
TCO2: 32 mmol/L (ref 0–100)
TCO2: 32 mmol/L (ref 0–100)
TCO2: 32 mmol/L (ref 0–100)
TCO2: 32 mmol/L (ref 0–100)
TCO2: 32 mmol/L (ref 0–100)
TCO2: 32 mmol/L (ref 0–100)
TCO2: 33 mmol/L (ref 0–100)
TCO2: 33 mmol/L (ref 0–100)
TCO2: 33 mmol/L (ref 0–100)
TCO2: 34 mmol/L (ref 0–100)
TCO2: 34 mmol/L (ref 0–100)
TCO2: 34 mmol/L (ref 0–100)
TCO2: 34 mmol/L (ref 0–100)
TCO2: 34 mmol/L (ref 0–100)
TCO2: 34 mmol/L (ref 0–100)
TCO2: 35 mmol/L (ref 0–100)
TCO2: 35 mmol/L (ref 0–100)
TCO2: 35 mmol/L (ref 0–100)
TCO2: 35 mmol/L (ref 0–100)
TCO2: 36 mmol/L (ref 0–100)
TCO2: 36 mmol/L (ref 0–100)
TCO2: 36 mmol/L (ref 0–100)
TCO2: 37 mmol/L (ref 0–100)
TCO2: 37 mmol/L (ref 0–100)
TCO2: 37 mmol/L (ref 0–100)
TCO2: 38 mmol/L (ref 0–100)
TCO2: 38 mmol/L (ref 0–100)
TCO2: 38 mmol/L (ref 0–100)
TCO2: 38 mmol/L (ref 0–100)
TCO2: 39 mmol/L (ref 0–100)
TCO2: 40 mmol/L (ref 0–100)
TCO2: 40 mmol/L (ref 0–100)
TCO2: 41 mmol/L (ref 0–100)
TCO2: 41 mmol/L (ref 0–100)

## 2015-07-25 LAB — POCT I-STAT 3, ART BLOOD GAS (G3+)
ACID-BASE EXCESS: 14 mmol/L — AB (ref 0.0–2.0)
BICARBONATE: 37.5 meq/L — AB (ref 20.0–24.0)
O2 SAT: 96 %
PH ART: 7.563 — AB (ref 7.350–7.450)
PO2 ART: 70 mmHg — AB (ref 80.0–100.0)
TCO2: 39 mmol/L (ref 0–100)
pCO2 arterial: 41.4 mmHg (ref 35.0–45.0)

## 2015-07-25 LAB — PREALBUMIN: Prealbumin: 15 mg/dL — ABNORMAL LOW (ref 18–38)

## 2015-07-25 LAB — PREPARE FRESH FROZEN PLASMA
Unit division: 0
Unit division: 0

## 2015-07-25 LAB — DIFFERENTIAL
Basophils Absolute: 0 10*3/uL (ref 0.0–0.1)
Basophils Relative: 0 %
Eosinophils Absolute: 0 10*3/uL (ref 0.0–0.7)
Eosinophils Relative: 0 %
Lymphocytes Relative: 2 %
Lymphs Abs: 0.6 10*3/uL — ABNORMAL LOW (ref 0.7–4.0)
Monocytes Absolute: 0.6 10*3/uL (ref 0.1–1.0)
Monocytes Relative: 2 %
Neutro Abs: 30.6 10*3/uL — ABNORMAL HIGH (ref 1.7–7.7)
Neutrophils Relative %: 96 %

## 2015-07-25 LAB — CBC
HCT: 25.8 % — ABNORMAL LOW (ref 39.0–52.0)
Hemoglobin: 8.7 g/dL — ABNORMAL LOW (ref 13.0–17.0)
MCH: 29 pg (ref 26.0–34.0)
MCHC: 33.7 g/dL (ref 30.0–36.0)
MCV: 86 fL (ref 78.0–100.0)
Platelets: 128 10*3/uL — ABNORMAL LOW (ref 150–400)
RBC: 3 MIL/uL — ABNORMAL LOW (ref 4.22–5.81)
RDW: 20.3 % — ABNORMAL HIGH (ref 11.5–15.5)
WBC: 31.8 10*3/uL — ABNORMAL HIGH (ref 4.0–10.5)

## 2015-07-25 LAB — RENAL FUNCTION PANEL
Albumin: 1.9 g/dL — ABNORMAL LOW (ref 3.5–5.0)
Anion gap: 15 (ref 5–15)
BUN: 55 mg/dL — ABNORMAL HIGH (ref 6–20)
CO2: 36 mmol/L — ABNORMAL HIGH (ref 22–32)
Calcium: 9.3 mg/dL (ref 8.9–10.3)
Chloride: 87 mmol/L — ABNORMAL LOW (ref 101–111)
Creatinine, Ser: 2.57 mg/dL — ABNORMAL HIGH (ref 0.61–1.24)
GFR calc Af Amer: 28 mL/min — ABNORMAL LOW (ref >60)
GFR calc non Af Amer: 24 mL/min — ABNORMAL LOW (ref >60)
Glucose, Bld: 132 mg/dL — ABNORMAL HIGH (ref 65–99)
Phosphorus: 4.6 mg/dL (ref 2.5–4.6)
Potassium: 4 mmol/L (ref 3.5–5.1)
Sodium: 138 mmol/L (ref 135–145)

## 2015-07-25 LAB — COMPREHENSIVE METABOLIC PANEL
ALT: 296 U/L — ABNORMAL HIGH (ref 17–63)
AST: 104 U/L — ABNORMAL HIGH (ref 15–41)
Albumin: 2.1 g/dL — ABNORMAL LOW (ref 3.5–5.0)
Alkaline Phosphatase: 154 U/L — ABNORMAL HIGH (ref 38–126)
Anion gap: 18 — ABNORMAL HIGH (ref 5–15)
BUN: 66 mg/dL — ABNORMAL HIGH (ref 6–20)
CO2: 31 mmol/L (ref 22–32)
Calcium: 9.3 mg/dL (ref 8.9–10.3)
Chloride: 89 mmol/L — ABNORMAL LOW (ref 101–111)
Creatinine, Ser: 3.24 mg/dL — ABNORMAL HIGH (ref 0.61–1.24)
GFR calc Af Amer: 21 mL/min — ABNORMAL LOW (ref >60)
GFR calc non Af Amer: 18 mL/min — ABNORMAL LOW (ref >60)
Glucose, Bld: 140 mg/dL — ABNORMAL HIGH (ref 65–99)
Potassium: 3.7 mmol/L (ref 3.5–5.1)
Sodium: 138 mmol/L (ref 135–145)
Total Bilirubin: 36.8 mg/dL (ref 0.3–1.2)
Total Protein: 5.7 g/dL — ABNORMAL LOW (ref 6.5–8.1)

## 2015-07-25 LAB — CARBOXYHEMOGLOBIN
Carboxyhemoglobin: 1.7 % — ABNORMAL HIGH (ref 0.5–1.5)
Methemoglobin: 0.3 % (ref 0.0–1.5)
O2 Saturation: 74.5 %
Total hemoglobin: 9.2 g/dL — ABNORMAL LOW (ref 13.5–18.0)

## 2015-07-25 LAB — PROCALCITONIN: Procalcitonin: 7.21 ng/mL

## 2015-07-25 LAB — PROTIME-INR
INR: 1.89 — AB (ref 0.00–1.49)
PROTHROMBIN TIME: 21.6 s — AB (ref 11.6–15.2)

## 2015-07-25 LAB — MAGNESIUM: Magnesium: 2.3 mg/dL (ref 1.7–2.4)

## 2015-07-25 LAB — TRIGLYCERIDES: Triglycerides: 188 mg/dL — ABNORMAL HIGH (ref <150)

## 2015-07-25 LAB — VANCOMYCIN, RANDOM: Vancomycin Rm: 21 ug/mL

## 2015-07-25 LAB — PHOSPHORUS: Phosphorus: 5.2 mg/dL — ABNORMAL HIGH (ref 2.5–4.6)

## 2015-07-25 MED ORDER — TRACE MINERALS CR-CU-MN-SE-ZN 10-1000-500-60 MCG/ML IV SOLN
INTRAVENOUS | Status: AC
  Administered 2015-07-25: 18:00:00 via INTRAVENOUS
  Filled 2015-07-25: qty 1440

## 2015-07-25 MED ORDER — VANCOMYCIN HCL IN DEXTROSE 1-5 GM/200ML-% IV SOLN
1000.0000 mg | INTRAVENOUS | Status: DC
  Administered 2015-07-25 – 2015-07-28 (×4): 1000 mg via INTRAVENOUS
  Filled 2015-07-25 (×5): qty 200

## 2015-07-25 MED ORDER — FAT EMULSION 20 % IV EMUL
240.0000 mL | INTRAVENOUS | Status: AC
  Administered 2015-07-25: 240 mL via INTRAVENOUS
  Filled 2015-07-25: qty 250

## 2015-07-25 NOTE — Progress Notes (Signed)
2 Days Post-Op Procedure(s) (LRB): STERNAL CLOSURE WITH PUMP STANDBY (N/A) TRANSESOPHAGEAL ECHOCARDIOGRAM (TEE) (N/A) Subjective: Intubated and sedated  Objective: Vital signs in last 24 hours: Temp:  [96 F (35.6 C)-97.5 F (36.4 C)] 97.4 F (36.3 C) (06/18 1200) Pulse Rate:  [73-87] 85 (06/18 1215) Cardiac Rhythm:  [-] Normal sinus rhythm (06/18 1215) Resp:  [15-38] 25 (06/18 1215) BP: (121-153)/(48-55) 121/50 mmHg (06/18 1151) SpO2:  [92 %-100 %] 100 % (06/18 1215) Arterial Line BP: (95-200)/(40-67) 116/49 mmHg (06/18 1215) FiO2 (%):  [40 %-50 %] 40 % (06/18 1215) Weight:  [106.4 kg (234 lb 9.1 oz)] 106.4 kg (234 lb 9.1 oz) (06/18 0600)  Hemodynamic parameters for last 24 hours: CVP:  [13 mmHg-16 mmHg] 13 mmHg  Intake/Output from previous day: 06/17 0701 - 06/18 0700 In: 5198.8 [I.V.:3808; Blood:401.7; NG/GT:130; IV Piggyback:350; TPN:509.1] Out: 7185 [Emesis/NG output:600; Chest Tube:320] Intake/Output this shift: Total I/O In: 1045.7 [I.V.:840.7; TPN:205] Out: 1970 [Other:1970]  General appearance: jaundiced, sedated on vent Neurologic: moves all extremities according to nurse but not following commands Heart: regular rate and rhythm, S1, S2 normal, no murmur, click, rub or gallop Lungs: clear to auscultation bilaterally Abdomen: no bowel sounds, soft, non-distended Extremities: anasarca Wound: dressings dry   Lab Results:  Recent Labs  07/24/15 0430  07/25/15 0355  07/25/15 0808 07/25/15 0813  WBC 21.3*  --  31.8*  --   --   --   HGB 8.4*  < > 8.7*  < > 10.2* 11.2*  HCT 25.4*  < > 25.8*  < > 30.0* 33.0*  PLT 90*  --  128*  --   --   --   < > = values in this interval not displayed. BMET:  Recent Labs  07/24/15 1620  07/25/15 0355  07/25/15 0808 07/25/15 0813  NA 137  < > 138  < > 137 139  K 3.8  < > 3.7  < > 3.7 3.6  CL 93*  < > 89*  < > 88* 85*  CO2 29  --  31  --   --   --   GLUCOSE 132*  < > 140*  < > 147* 209*  BUN 79*  < > 66*  < > 55* 46*   CREATININE 4.00*  < > 3.24*  < > 3.20* 2.50*  CALCIUM 8.8*  --  9.3  --   --   --   < > = values in this interval not displayed.  PT/INR:  Recent Labs  07/25/15 0355  LABPROT 21.6*  INR 1.89*   ABG    Component Value Date/Time   PHART 7.563* 07/25/2015 0358   HCO3 37.5* 07/25/2015 0358   TCO2 34 07/25/2015 0813   ACIDBASEDEF 0.6 07/21/2015 0415   O2SAT 96.0 07/25/2015 0358   CBG (last 3)   Recent Labs  07/25/15 0907 07/25/15 1101 07/25/15 1259  GLUCAP 137* 144* 136*   CLINICAL DATA: Jaundice. Intubated.  EXAM: PORTABLE CHEST 1 VIEW  COMPARISON: Chest radiograph 07/24/2015.  FINDINGS: ET tube terminates in the mid trachea. Left subclavian central venous catheter tip projects over the superior vena cava. Bilateral chest tubes are in place. Enteric tube courses inferior to the diaphragm. Stable cardiomegaly. Interval removal left subclavian central venous catheter. Low lung volumes. Basilar airspace opacities, unchanged. Small left pneumothorax.  IMPRESSION: New small left pneumothorax with bilateral chest tubes in place.  ET tube terminates in the mid trachea.  Cardiomegaly.  Stable bibasilar opacities favored represent atelectasis.  Critical  Value/emergent results were called by telephone at the time of interpretation on 07/25/2015 at 8:37 am to Nurse Lily Kocher, who verbally acknowledged these results.   Electronically Signed  By: Lovey Newcomer M.D.  On: 07/25/2015 08:40  Assessment/Plan: S/P Procedure(s) (LRB): STERNAL CLOSURE WITH PUMP STANDBY (N/A) TRANSESOPHAGEAL ECHOCARDIOGRAM (TEE) (N/A)  1. CV: Hemodynamically stable on milrinone 0.125 and epi 14. Epi was down to 11 but had to go up this am. Co-ox 75%.   2. VDRF: continue present support for now. CXR looks good. There is tiny left ptx and small air leak from chest tube. The left tube is at the base. Peep dropped to 5.  3. Renal: ESRD. CRRT going. Were able to take 100 cc  per hr off but this am keeping even due to rising epi requirement. CVP lower at 13. Probably intravascularly euvolemic to dry.   4. ID: On Zosyn, Vanc and Diflucan. Vanc trough 21 this am so does not need another dose for a while.  5. Nutrition: On TNA   6. DM: on insulin drip at 27.5 units per hr due to TNA, catecholemines, sepsis. Glucose 150-209.   LOS: 19 days    Gaye Pollack 07/25/2015

## 2015-07-25 NOTE — Progress Notes (Signed)
Spoke with rounding MD Dr Cyndia Bent about having to increase epi drip, CRRT goal was -150, will now make goal keep even. Will cont to monitor and assess pt.

## 2015-07-25 NOTE — Progress Notes (Signed)
Subjective:  CRRT running- was close to 2 liters negative- pressors down a little  Objective Vital signs in last 24 hours: Filed Vitals:   07/25/15 0730 07/25/15 0738 07/25/15 0745 07/25/15 0808  BP:  144/51    Pulse: 75 74 76   Temp:    96 F (35.6 C)  TempSrc:    Axillary  Resp: 35 35 35   Height:      Weight:      SpO2: 100% 100% 100%    Weight change: -2.9 kg (-6 lb 6.3 oz)  Intake/Output Summary (Last 24 hours) at 07/25/15 5009 Last data filed at 07/25/15 0700  Gross per 24 hour  Intake 5057.27 ml  Output   7135 ml  Net -2077.73 ml    Assessment/ Plan: Pt is a 70 y.o. yo male with ESRD who was admitted on 07/06/2015 with CP and subsequent redo CABG that has been complicated by blood loss req transfusion and hemodynamic instability - balloon pump  Assessment/Plan: 1. CAD- complicated redo CABG- on pressors, milrinone- previous balloon pump - now out- hemodynamics appear to be improving 2. ESRD - normally TTS [facility]- has been off and on CRRT here-  resumed CRRT 6/17 via left groin HD cath (placed 6/8)- citrate protocol-  also with left forearm AVF - elytes stable on 4 K bath- Mag actually 2.3.    3. Anemia- complicating issue- has required transfusions- have added darbe- trending up 4. HTN/volume- resumed CRRT with volume removal 150 per hour - CVP 15- may be able to decrease rate of UF soon 5. Inc LFTs- shock liver ? - trending better except bili 6. ID- rising WBC- lines changed- per primary team  Kenlee Vogt A    Labs: Basic Metabolic Panel:  Recent Labs Lab 07/24/15 0430  07/24/15 1620  07/25/15 0355  07/25/15 0500 07/25/15 0601 07/25/15 0607  NA 137  < > 137  < > 138  < > 136 138 137  K 4.7  < > 3.8  < > 3.7  < > 3.7 3.7 3.7  CL 92*  < > 93*  < > 89*  < > 90* 86* 88*  CO2 29  --  29  --  31  --   --   --   --   GLUCOSE 131*  < > 132*  < > 140*  < > 146* 202* 146*  BUN 75*  < > 79*  < > 66*  < > 55* 48* 54*  CREATININE 4.33*  < > 4.00*   < > 3.24*  < > 3.40* 2.60* 3.10*  CALCIUM 8.5*  --  8.8*  --  9.3  --   --   --   --   PHOS 5.9*  --  6.0*  --  5.2*  --   --   --   --   < > = values in this interval not displayed. Liver Function Tests:  Recent Labs Lab 08/05/2015 0405  07/24/15 0430 07/24/15 1620 07/25/15 0355  AST 136*  --  96*  --  104*  ALT 605*  --  358*  --  296*  ALKPHOS 178*  --  148*  --  154*  BILITOT 27.5*  --  32.0*  --  36.8*  PROT 5.4*  --  5.3*  --  5.7*  ALBUMIN 2.1*  2.2*  < > 2.1* 2.2* 2.1*  < > = values in this interval not displayed. No results for input(s): LIPASE, AMYLASE in  the last 168 hours. No results for input(s): AMMONIA in the last 168 hours. CBC:  Recent Labs Lab 07/22/15 0402  07/27/2015 0405  08/03/2015 1100 07/24/15 0430  07/25/15 0355  07/25/15 0500 07/25/15 0601 07/25/15 0607  WBC 19.0*  --  18.9*  --  19.3* 21.3*  --  31.8*  --   --   --   --   NEUTROABS  --   --   --   --   --   --   --  30.6*  --   --   --   --   HGB 9.6*  < > 8.8*  < > 8.0*  8.8* 8.4*  < > 8.7*  < > 9.5* 11.6* 10.5*  HCT 28.2*  < > 26.4*  < > 24.0*  26.0* 25.4*  < > 25.8*  < > 28.0* 34.0* 31.0*  MCV 83.7  --  85.4  --  87.9 85.5  --  86.0  --   --   --   --   PLT 52*  --  75*  --  60* 90*  --  128*  --   --   --   --   < > = values in this interval not displayed. Cardiac Enzymes: No results for input(s): CKTOTAL, CKMB, CKMBINDEX, TROPONINI in the last 168 hours. CBG:  Recent Labs Lab 07/25/15 0300 07/25/15 0353 07/25/15 0458 07/25/15 0605 07/25/15 0659  GLUCAP 149* 137* 135* 136* 144*    Iron Studies: No results for input(s): IRON, TIBC, TRANSFERRIN, FERRITIN in the last 72 hours. Studies/Results: Dg Chest Port 1 View  07/24/2015  CLINICAL DATA:  Status post CABG EXAM: PORTABLE CHEST 1 VIEW COMPARISON:  Chest radiograph from one day prior. FINDINGS: Endotracheal tube tip is 4.9 cm above the carina. Enteric tube terminates in the gastric fundus. Left internal jugular Swan-Ganz catheter  terminates in the main pulmonary artery. Left subclavian central venous catheter terminates in the lower third of the superior vena cava. Stable position of the bilateral chest tubes. Sternotomy wires appear aligned and intact. Skin staples overlie the midline chest. Stable cardiomediastinal silhouette with mild cardiomegaly. No pneumothorax. No pleural effusion. No overt pulmonary edema. Mild bibasilar atelectasis, decreased. IMPRESSION: 1. Support structures as described. 2. No pneumothorax. 3. Mild bibasilar atelectasis, decreased. 4. Stable mild cardiomegaly without overt pulmonary edema. Electronically Signed   By: Ilona Sorrel M.D.   On: 07/24/2015 07:54   Dg Chest Port 1 View  07/24/2015  CLINICAL DATA:  70 year old male status post redo CABG. Initial encounter. EXAM: PORTABLE CHEST 1 VIEW COMPARISON:  0617 hours today and earlier. FINDINGS: Portable AP supine view at 1202 hours. Endotracheal tube and visible enteric tubes remain stable. Left IJ approach Swan-Ganz catheter appears stable. There is now a left subclavian approach central line in place. The tip projects about 1 vertebral body below the level of the carina. Visible bilateral chest tubes appear stable. No pneumothorax or pulmonary edema identified. Mildly increased bibasilar and bilateral infrahilar opacity. Stable cardiac size and mediastinal contours. IMPRESSION: 1. Left subclavian central line placed, tip at the lower SVC level. Otherwise stable lines and tubes. 2. No pneumothorax identified. Mildly increased bibasilar opacity, favor atelectasis. Electronically Signed   By: Genevie Ann M.D.   On: 08/03/2015 11:19   Medications: Infusions: . sodium chloride    . sodium chloride    . calcium gluconate infusion for CRRT 20 g (07/25/15 0700)  . EPINEPHrine 4 mg in dextrose 5% 250  mL infusion (16 mcg/mL) 11 mcg/min (07/25/15 0700)  . fentaNYL infusion INTRAVENOUS 100 mcg/hr (07/25/15 0700)  . insulin (NOVOLIN-R) infusion 27 Units/hr  (07/25/15 0700)  . lactated ringers 20 mL/hr at 07/27/2015 2200  . midazolam (VERSED) infusion 3 mg/hr (07/25/15 0700)  . milrinone 0.125 mcg/kg/min (07/25/15 0700)  . norepinephrine (LEVOPHED) Adult infusion Stopped (07/24/15 0630)  . dialysis replacement fluid (prismasate) 300 mL/hr at 07/25/15 0304  . dialysate (PRISMASATE) 1,500 mL/hr at 07/25/15 0618  . sodium citrate 2 %/dextrose 2.5% solution 3000 mL 500 mL/hr at 07/14/2015 0427  . sodium citrate 2 %/dextrose 2.5% solution 3000 mL 440 mL/hr at 07/25/15 0421  . TPN (CLINIMIX) Adult without lytes 41 mL/hr at 07/25/15 0700    Scheduled Medications: . sodium chloride   Intravenous Once  . sodium chloride   Intravenous Once  . antiseptic oral rinse  7 mL Mouth Rinse 10 times per day  . bisacodyl  10 mg Oral Daily   Or  . bisacodyl  10 mg Rectal Daily  . chlorhexidine gluconate (SAGE KIT)  15 mL Mouth Rinse BID  . darbepoetin (ARANESP) injection - NON-DIALYSIS  100 mcg Subcutaneous Q Fri-1800  . docusate sodium  200 mg Oral Daily  . famotidine (PEPCID) IV  20 mg Intravenous Q24H  . feeding supplement (PRO-STAT SUGAR FREE 64)  60 mL Oral QID  . imipenem-cilastatin  500 mg Intravenous Q8H  . insulin regular  0-10 Units Intravenous TID WC  . magnesium sulfate  4 g Intravenous Once  . metoCLOPramide (REGLAN) injection  10 mg Intravenous Q6H  . pantoprazole  40 mg Oral Daily    have reviewed scheduled and prn medications.  Physical Exam: General:sedated on vent Heart: RRR Lungs: CBS bilat Abdomen: distended Extremities: dependent pitting edema Dialysis Access: left groin vascath placed 6/8- also left forearm AVF is patent   07/25/2015,8:08 AM  LOS: 19 days

## 2015-07-25 NOTE — Progress Notes (Signed)
PULMONARY / CRITICAL CARE MEDICINE   Name: Joseph [name] MRN: [id number] DOB: [birth year]    ADMISSION DATE:  06/22/2015 CONSULTATION DATE:  07/16/2015  REFERRING MD:  Nils Pyle  CHIEF COMPLAINT:  Post cardiothoracic surgery ventilator management  HISTORY OF PRESENT ILLNESS:   70 y/o male with ESRD and significant CAD (CABG 2001, DES 2016, EF 40-45%) was admitted on 5/29 with NSTEMI and had persistent chest pain during his hospitalization so he was taken for a redo CABG on 6/8 (SVG to diag and SVG to OM, IABP placement and CVL placement).  Post operatively he had excessive bleeding from all chest drains (pleural and mediastinal) so he was taken back to the OR on 6/9 for re-exploration.  PCCM was consulted for ventilator management.    SUBJECTIVE:  Remains critically ill, jaundiced, no events overnight.  VITAL SIGNS: BP 144/51 mmHg  Pulse 78  Temp(Src) 97.5 F (36.4 C) (Axillary)  Resp 35  Ht 5\' 11"  (1.803 m)  Wt 106.4 kg (234 lb 9.1 oz)  BMI 32.73 kg/m2  SpO2 100%  HEMODYNAMICS: PAP: (37-41)/(25-32) 37/25 mmHg CVP:  [13 mmHg-24 mmHg] 14 mmHg  VENTILATOR SETTINGS: Vent Mode:  [-] PRVC FiO2 (%):  [40 %-60 %] 40 % Set Rate:  [35 bmp] 35 bmp Vt Set:  [550 mL] 550 mL PEEP:  [8 cmH20] 8 cmH20 Plateau Pressure:  [20 [id number] cmH20] 25 cmH20  INTAKE / OUTPUT: I/O last 3 completed shifts: In: 7041.3 [I.V.:5250.6; Blood:401.7; NG/GT:430; IV Piggyback:450] Out: 7225 [Emesis/NG output:600; XP:[id number]; Chest Tube:360]  PHYSICAL EXAMINATION: General:  Critically ill on vent, CVVHD, jaundiced and worsening. Neuro:  Heavily sedated, no longer paralyzed. HEENT:  Diffuse facial edema improved, ETT in place Cardiovascular:  Balloon pump, median sternotomy dressing dry, atrial paced, decreased drainage out of mediastinal and pleural tubes Lungs:  resps even non labored on vent, rales throughout, diminished bibasilar  Abdomen:  Distended, some focal superficial bruising, -bs   Musculoskeletal:  No bony abnormalities Skin:  Bruising all over extensor surfaces and belly, generalized 2++ edema  LABS:  BMET  Recent Labs Lab 07/24/15 0430  07/24/15 1620  07/25/15 0355  07/25/15 0702 07/25/15 0808 07/25/15 0813  NA 137  < > 137  < > 138  < > 137 137 139  K 4.7  < > 3.8  < > 3.7  < > 3.7 3.7 3.6  CL 92*  < > 93*  < > 89*  < > 87* 88* 85*  CO2 29  --  29  --  31  --   --   --   --   BUN 75*  < > 79*  < > 66*  < > 55* 55* 46*  CREATININE 4.33*  < > 4.00*  < > 3.24*  < > 3.20* 3.20* 2.50*  GLUCOSE 131*  < > 132*  < > 140*  < > 149* 147* 209*  < > = values in this interval not displayed.  Electrolytes  Recent Labs Lab 07/15/2015 0405  07/24/15 0430 07/24/15 1620 07/25/15 0355  CALCIUM 10.0  10.0  < > 8.5* 8.8* 9.3  MG 2.2  --  2.6*  --  2.3  PHOS 4.0  4.1  < > 5.9* 6.0* 5.2*  < > = values in this interval not displayed.  CBC  Recent Labs Lab 07/11/2015 1100 07/24/15 0430  07/25/15 0355  07/25/15 0702 07/25/15 0808 07/25/15 0813  WBC 19.3* 21.3*  --  31.8*  --   --   --   --  HGB 8.0*  8.8* 8.4*  < > 8.7*  < > 10.2* 10.2* 11.2*  HCT 24.0*  26.0* 25.4*  < > 25.8*  < > 30.0* 30.0* 33.0*  PLT 60* 90*  --  128*  --   --   --   --   < > = values in this interval not displayed.  Coag's  Recent Labs Lab 08/04/2015 1100 07/24/15 0430 07/25/15 0355  APTT 34 38*  --   INR 1.86* 2.10* 1.89*   Sepsis Markers  Recent Labs Lab 07/20/2015 1100 07/24/15 0430 07/25/15 0355  PROCALCITON 11.41 12.60 7.21   ABG  Recent Labs Lab 08/06/2015 1555 07/24/15 0418 07/25/15 0358  PHART 7.573* 7.538* 7.563*  PCO2ART 40.9 37.7 41.4  PO2ART 59.0* 74.0* 70.0*    Liver Enzymes  Recent Labs Lab 07/27/2015 0405  07/24/15 0430 07/24/15 1620 07/25/15 0355  AST 136*  --  96*  --  104*  ALT 605*  --  358*  --  296*  ALKPHOS 178*  --  148*  --  154*  BILITOT 27.5*  --  32.0*  --  36.8*  ALBUMIN 2.1*  2.2*  < > 2.1* 2.2* 2.1*  < > = values in this  interval not displayed.  Cardiac Enzymes No results for input(s): TROPONINI, PROBNP in the last 168 hours.  Glucose  Recent Labs Lab 07/25/15 0300 07/25/15 0353 07/25/15 0458 07/25/15 0605 07/25/15 0659 07/25/15 0907  GLUCAP 149* 137* 135* 136* 144* 137*    Imaging Dg Chest Port 1 View  07/25/2015  CLINICAL DATA:  Jaundice.  Intubated. EXAM: PORTABLE CHEST 1 VIEW COMPARISON:  Chest radiograph 07/24/2015. FINDINGS: ET tube terminates in the mid trachea. Left subclavian central venous catheter tip projects over the superior vena cava. Bilateral chest tubes are in place. Enteric tube courses inferior to the diaphragm. Stable cardiomegaly. Interval removal left subclavian central venous catheter. Low lung volumes. Basilar airspace opacities, unchanged. Small left pneumothorax. IMPRESSION: New small left pneumothorax with bilateral chest tubes in place. ET tube terminates in the mid trachea. Cardiomegaly. Stable bibasilar opacities favored represent atelectasis. Critical Value/emergent results were called by telephone at the time of interpretation on 07/25/2015 at 8:37 am to Nurse Lily Kocher, who verbally acknowledged these results. Electronically Signed   By: Lovey Newcomer M.D.   On: 07/25/2015 08:40   STUDIES:  6/14 TTE >EF 30-35%  CULTURES: Sputum 6/10 >> neg  ANTIBIOTICS: 6/8 Vanc >>6/16 6/10 Zosyn >>6/16 Primaxin 6/16>>>  SIGNIFICANT EVENTS: 6/8 Redo CABG 6/8 take back to OR emergently for diffuse bleeding 6/16 back to OR to close sternum  LINES/TUBES: Left forearm fistula 6/8 R IJ introducer >> 6/8 Multiple chest tubes (mediastinal and pleural bilateral) >> 6/8 CVC left femoral vein >>out 6/8 R radial arterial line >>6/17>>>6/17>>> 6/8 L IJ Swan Ganz >>6/17 L Weidman TLC 6/16>>> ETT 6/8>>>  DISCUSSION: 70 y/o male s/p redo CABG 6/8 complicated by significant post operative bleeding in the setting of thrombocytopenia brought back to the Surgical ICU on 6/9 with hypoxemic  respiratory failure.  He has baseline ESRD and DM2.  ASSESSMENT / PLAN:  PULMONARY A: Acute hypoxemic respiratory failure > Pulm edema, at risk for TRALI or TACO Post operative pleural bleeding Respiratory acidosis - suspect mostly V/Q mismatch from cardiogenic shock, improved Small R Pleural Effusion P:   Continue CRRT FiO2 40% pot op and PEEP 5. Intermittent CXR Chest tubes per thoracic surgery, replaced CT per surgery. No weaning due to hemodynamics. Possible trach next  week. Need to ask who will do it TCVS or PCCM.   CARDIOVASCULAR A:  Severe Cardiogenic and hemorrhagic shock CAD s/p redo CABG P:  D/C levophed. Epi to 12 IABP out today after FFP Pacing per TCTS. Tele monitoring.  RENAL A:   ESRD CKD V P:   Continue CVVHD per renal -- tolerating volume removal on pressors, -150 ml/hr. Renal following Replace electrolytes as indicated.  GASTROINTESTINAL A:   Jaundice and coagulopathy due to shocked liver. P:   OG tube TPN per pharmacy due to high residuals. H2 blocker for stress ulcer prophylaxis Abdominal U/S negative for obstruction, likely shocked liver.  HEMATOLOGIC A:   Diffuse oozing due to coagulopathy from blood loss, thrombocytopenia P:  Transfuse per TCTS guidelines. Trend CBC, coags.  INFECTIOUS A:   ?HCAP - doubt, respiratory culture negative P:   Empiric abx primaxin Monitor fever curve / WBC. D9/x abx, will continue for now. PCT elevated on 6/14.  ENDOCRINE A:   DM2   P:   Monitor glucose Insulin gtt per TCTS post op protocol  NEUROLOGIC A:   Sedation needs for vent synchrony P:   RASS goal: -2 Fentanyl gtt  Versed gtt  FAMILY  - Updates:  No family bedside 6/18.  Will need trach if we are to continue support, easily doable bedside when CVTS is ready.  - Inter-disciplinary family meet or Palliative Care meeting due by:  day 7  The patient is critically ill with multiple organ systems failure and requires high complexity  decision making for assessment and support, frequent evaluation and titration of therapies, application of advanced monitoring technologies and extensive interpretation of multiple databases.   Critical Care Time devoted to patient care services described in this note is  35  Minutes. This time reflects time of care of this signee Dr [name]. This critical care time does not reflect procedure time, or teaching time or supervisory time of PA/NP/Med student/Med Resident etc but could involve care discussion time.  Rush Farmer, M.D. [facility] Pulmonary/Critical Care Medicine. Pager: [phone]. After hours pager: [phone].

## 2015-07-25 NOTE — Progress Notes (Signed)
Pharmacy Antibiotic Note  KOFI PRINKEY is a 70 y.o. male s/p redo CABG on 6/9 who continues on vancomycin and zosyn for open sternum. Now s/p sternal closure. CRRT was restarted 6/17. Random vancomycin level today is slightly subtherapeutic at 21 mcg/ml after restarting CRRT. Will wait appropriately 6h and restart vancomycin 10mg /kg q24h.  Plan: Vancomycin 1g IV q24h  Primaxin 500mg  IV q8h Monitor culture data, CRRT plans and clinical course VR prn  Height: 5\' 11"  (180.3 cm) Weight: 234 lb 9.1 oz (106.4 kg) IBW/kg (Calculated) : 75.3  Temp (24hrs), Avg:96.9 F (36.1 C), Min:96 F (35.6 C), Max:97.5 F (36.4 C)   Recent Labs Lab 07/21/15 0358  07/21/15 0920  07/22/15 0402  07/26/2015 0405  07/22/2015 1100  07/24/15 0430 07/24/15 1130  07/25/15 0355  07/25/15 0607 07/25/15 0655 07/25/15 0702 07/25/15 0808 07/25/15 0813  WBC 17.9*  --   --   < > 19.0*  --  18.9*  --  19.3*  --  21.3*  --   --  31.8*  --   --   --   --   --   --   CREATININE 2.75*  < >  --   < > 2.59*  < > 2.43*  2.37*  < >  --   < > 4.33*  --   < > 3.24*  < > 3.10* 2.50* 3.20* 3.20* 2.50*  VANCOTROUGH  --   --  25*  --   --   --   --   --   --   --   --   --   --   --   --   --   --   --   --   --   VANCORANDOM 28  --   --   --   --   --   --   --   --   --   --  35  --   --   --   --   --   --   --   --   < > = values in this interval not displayed.  Estimated Creatinine Clearance: 35.6 mL/min (by C-G formula based on Cr of 2.5).    Allergies  Allergen Reactions  . Sulfa Antibiotics Hives and Itching  . Morphine And Related     Makes patient feel weird    Antimicrobials this admission: 6/8 Vanc>> 6/9 Zosyn>>6/17 6/17 Primaxin>>  Dose adjustments this admission: 6/10 AM VR (CVTS ordered): 26 (only received 2 doses, CRRT off x 10 hours when level drawn) 6/11 AM VR (CVTS): 14 (no dose 6/10) - start 1250mg  q24 6/14 VT = 25 on 1250mg  q24 with CRRT (drawn ~ 1 hour early) 6/17 VR: 35 after vancomycin  1g 6/16 at 1135 (hold and recheck tomorrow AM) 6/18 VR: 21 after holding vancomycin  Microbiology results: 6/10 TA: neg 6/7 MRSA PCR: neg   Andrey Cota. Diona Foley, PharmD, [facility] Clinical Pharmacist Pager [phone] 07/25/2015 12:35 PM

## 2015-07-25 NOTE — Progress Notes (Signed)
Patient ID: Joseph [name], male   DOB: 08-09-1945, 70 y.o.   MRN: [id number]  SICU Evening Rounds:  Hemodynamically stable this pm. Epi back down to 11. CVP 14 and starting to remove fluid with CRRT again.  PM labs ok

## 2015-07-25 NOTE — Progress Notes (Signed)
PARENTERAL NUTRITION CONSULT NOTE - INITIAL  Pharmacy Consult for TPN Indication:   Allergies  Allergen Reactions  . Sulfa Antibiotics Hives and Itching  . Morphine And Related     Makes patient feel weird   Patient Measurements: Height: '5\' 11"'$  (180.3 cm) Weight: 234 lb 9.1 oz (106.4 kg) IBW/kg (Calculated) : 75.3  Vital Signs: Temp: 96 F (35.6 C) (06/18 0808) Temp Source: Axillary (06/18 0808) BP: 144/51 mmHg (06/18 0738) Pulse Rate: 76 (06/18 0745) Intake/Output from previous day: 06/17 0701 - 06/18 0700 In: 5198.8 [I.V.:3808; Blood:401.7; NG/GT:130; IV Piggyback:350; TPN:509.1] Out: 7185 [Emesis/NG output:600; Chest Tube:320] Intake/Output from this shift: Total I/O In: 60 [I.V.:60] Out: 360 [Other:360]  Labs:  Recent Labs  08/06/2015 1100 07/24/15 0430  07/25/15 0355  07/25/15 0702 07/25/15 0808 07/25/15 0813  WBC 19.3* 21.3*  --  31.8*  --   --   --   --   HGB 8.0*  8.8* 8.4*  < > 8.7*  < > 10.2* 10.2* 11.2*  HCT 24.0*  26.0* 25.4*  < > 25.8*  < > 30.0* 30.0* 33.0*  PLT 60* 90*  --  128*  --   --   --   --   APTT 34 38*  --   --   --   --   --   --   INR 1.86* 2.10*  --  1.89*  --   --   --   --   < > = values in this interval not displayed.   Recent Labs  07/22/15 0938  07/27/2015 0405  07/24/15 0430  07/24/15 1620  07/25/15 0355  07/25/15 0702 07/25/15 0808 07/25/15 0813  NA  --   < > 139  139  < > 137  < > 137  < > 138  < > 137 137 139  K  --   < > 4.4  4.4  < > 4.7  < > 3.8  < > 3.7  < > 3.7 3.7 3.6  CL  --   < > 92*  94*  < > 92*  < > 93*  < > 89*  < > 87* 88* 85*  CO2  --   < > 33*  33*  < > 29  --  29  --  31  --   --   --   --   GLUCOSE  --   < > 99  99  < > 131*  < > 132*  < > 140*  < > 149* 147* 209*  BUN  --   < > 39*  39*  < > 75*  < > 79*  < > 66*  < > 55* 55* 46*  CREATININE  --   < > 2.43*  2.37*  < > 4.33*  < > 4.00*  < > 3.24*  < > 3.20* 3.20* 2.50*  CALCIUM  --   < > 10.0  10.0  < > 8.5*  --  8.8*  --  9.3  --   --   --    --   MG  --   --  2.2  --  2.6*  --   --   --  2.3  --   --   --   --   PHOS  --   < > 4.0  4.1  < > 5.9*  --  6.0*  --  5.2*  --   --   --   --  PROT  --   --  5.4*  --  5.3*  --   --   --  5.7*  --   --   --   --   ALBUMIN  --   < > 2.1*  2.2*  < > 2.1*  --  2.2*  --  2.1*  --   --   --   --   AST  --   --  136*  --  96*  --   --   --  104*  --   --   --   --   ALT  --   --  605*  --  358*  --   --   --  296*  --   --   --   --   ALKPHOS  --   --  178*  --  148*  --   --   --  154*  --   --   --   --   BILITOT 21.2*  --  27.5*  --  32.0*  --   --   --  36.8*  --   --   --   --   BILIDIR 14.8*  --   --   --   --   --   --   --   --   --   --   --   --   IBILI 6.4*  --   --   --   --   --   --   --   --   --   --   --   --   PREALBUMIN  --   --   --   --   --   --   --   --  15.0*  --   --   --   --   TRIG  --   --   --   --   --   --   --   --  188*  --   --   --   --   < > = values in this interval not displayed. Estimated Creatinine Clearance: 35.6 mL/min (by C-G formula based on Cr of 2.5).    Recent Labs  07/25/15 0458 07/25/15 0605 07/25/15 0659  GLUCAP 135* 136* 144*    Medical History: Past Medical History  Diagnosis Date  . Hypertensive heart disease   . Hypercholesterolemia   . Prostate cancer (HCC)   . Hepatosplenomegaly 2015    fibrosis, no cirrhosis on 06/2013 liver biopsy  . Thrombocytopenia (HCC) 2015  . Depression with anxiety   . IDDM (insulin dependent diabetes mellitus) (HCC)     INSULIN DEPENDENT  . GERD (gastroesophageal reflux disease)   . History of shingles   . Ischemic cardiomyopathy     a. echo 08/06/2014: EF 25-30%, multiple WMA, mod DD, PASP 49 mm Hg;  b. 12/2014 Echo: EF 30-35%, diff HK, mild MR.; Echo 03/2015 - ? LV thrombus (not seen in 3/27 - but EF ? 40-45%.   . CKD (chronic kidney disease), stage IV (HCC)     a. previously on dialysis 11/2013->04/2014;  b. 12/2014 Dialysis resumed.  . Coronary artery disease, occlusive     a. 2001 MI s/p  4v CABG (LIMA-LAD, SVG-D2, SVG-OM1, SVG-right PDA, SVG-right PL1); b. cath 07/30/2014 vein grafts all down, patent LIMA to LAD, significant LM and ost LCx dx; c. 08/07/2014 PCI Synergy DES 2.5 mm  x 18 mm to dist LM and ost LCx, rec lifelong DAPT.; 02/2015 - Patent LIMA-LAD with L-R Collaterals, 60% ISR of LM-Cx stent (non-occlusive)  . Chronic combined systolic and diastolic CHF, NYHA class 2 ([place])     a. 08/2013 Echo: EF 40-45%, impaired relaxation;  b. 07/2014 Echo: EF 25-30%;  c. 12/2014 Echo: EF 30-35%, diff HK, mild MR.  . Gallstone pancreatitis 2015  . ESRD (end stage renal disease) ([place])    Insulin Requirements in the past 24 hours:  IDDM - currently on an insulin drip with rate at 27 units/hr   Current Nutrition:  NPO - 6/9 >> started Nepro at 64m/hr on 6/12, Prostat supplement [id number] (LD 9PM 6/16)  Admit:  [age]yoale with significant PMH of CAD presents with STEMI with continued CP  Surgeries/Procedures: 6/8 Redo sternotomy, CABG - IABP - now out  GI: Jaundice - shocked liver -- Albumin 2.1, AST/ALT elevated but trending down, alk phos is 154.  Tbili = 32>>36.8 NGT with [id number] - Has IV Pepcid on board  Endo:. CBG's 135-149 - good control  Lytes: Electrolytes WNL except slightly elevated Phos - getting Calcium Gluc 20gm with CRRT  Renal: ESRD - Anuric - CVVHD ongoing - will use Clinimix without electrolytes and follow renal for replacement  Pulm: Intubated postop due to acute hypoxemic failure - CXR = now with small pneumothroax, atelectasis decreasing  Cards: Hx. CAD - CABG 2001, CHF/PCI, Dyslipidemia - NSR - Remains on Epinephrine at 80m41min - Milrinone at 0.125mc21m/min  Heme: Hypercoagulable - 2.1>>1.89, Darbo every Friday - surgery transfusions - RBC #18, Cryo - 6units, Plasma -8 units, platelets 11 units.  Has continued to require ongoing support for oozing and thrombocytopenia issues - H/H up today - hopefully stabilizing.  Platelets up some - 128K  Neuro: Intubated  and sedated with Fent/Versed - GCS = 9 and RASS of -2 Fent at 100mcg87m Versed at '3mg'$ /hr  ID: ? Of HCAP - started empiric antibiotics.  WBC - 21.3>>31.8,  PCT 12.6>>7.21, tmax to 102.9 - now 96 Primaxin 6/18 >>  Best Practices: SCD  TPN Access: CVC 6/16 TPN start date: 6/17>>  Nutritional Goals:  1315-1673 Kcal and 150-160gm protein - RD assessment  Plan:  Will increase Clinimix 5/15 without electrolytes to 60ml/h55md see how he responds then titrate to goal rate of 70 ml/hr.  This will meet kcal needs but will be unable to meet protein needs at this rate. (Total kcal with Lipids at 60ml/hr39m500 and 72gm protein) Lipids at 10ml/hr 77m10 units of insulin to TPN  Will add Pepcid to TPN to decrease fluid  Memphis Creswell JohnRober Minion, MS Clinical Pharmacist Pager:  [phone] for allowing pharmacy to be part of this patients care team. 07/25/2015,9:04 AM

## 2015-07-26 ENCOUNTER — Encounter (HOSPITAL_COMMUNITY): Payer: Self-pay | Admitting: Cardiothoracic Surgery

## 2015-07-26 ENCOUNTER — Inpatient Hospital Stay (HOSPITAL_COMMUNITY): Payer: Medicare Other

## 2015-07-26 DIAGNOSIS — J189 Pneumonia, unspecified organism: Secondary | ICD-10-CM

## 2015-07-26 LAB — GLUCOSE, CAPILLARY
GLUCOSE-CAPILLARY: 129 mg/dL — AB (ref 65–99)
GLUCOSE-CAPILLARY: 114 mg/dL — AB (ref 65–99)
GLUCOSE-CAPILLARY: 117 mg/dL — AB (ref 65–99)
GLUCOSE-CAPILLARY: 110 mg/dL — AB (ref 65–99)
GLUCOSE-CAPILLARY: 99 mg/dL (ref 65–99)
GLUCOSE-CAPILLARY: 88 mg/dL (ref 65–99)
GLUCOSE-CAPILLARY: 105 mg/dL — AB (ref 65–99)
GLUCOSE-CAPILLARY: 121 mg/dL — AB (ref 65–99)
GLUCOSE-CAPILLARY: 112 mg/dL — AB (ref 65–99)
Glucose-Capillary: 122 mg/dL — ABNORMAL HIGH (ref 65–99)
Glucose-Capillary: 124 mg/dL — ABNORMAL HIGH (ref 65–99)
Glucose-Capillary: 128 mg/dL — ABNORMAL HIGH (ref 65–99)
Glucose-Capillary: 134 mg/dL — ABNORMAL HIGH (ref 65–99)
Glucose-Capillary: 143 mg/dL — ABNORMAL HIGH (ref 65–99)
Glucose-Capillary: 114 mg/dL — ABNORMAL HIGH (ref 65–99)
Glucose-Capillary: 114 mg/dL — ABNORMAL HIGH (ref 65–99)
Glucose-Capillary: 85 mg/dL (ref 65–99)

## 2015-07-26 LAB — POCT I-STAT, CHEM 8
BUN: 37 mg/dL — ABNORMAL HIGH (ref 6–20)
BUN: 46 mg/dL — ABNORMAL HIGH (ref 6–20)
BUN: 34 mg/dL — AB (ref 6–20)
BUN: 43 mg/dL — AB (ref 6–20)
BUN: 35 mg/dL — ABNORMAL HIGH (ref 6–20)
BUN: 41 mg/dL — ABNORMAL HIGH (ref 6–20)
CALCIUM ION: 0.32 mmol/L — AB (ref 1.13–1.30)
CALCIUM ION: 0.97 mmol/L — AB (ref 1.13–1.30)
CHLORIDE: 81 mmol/L — AB (ref 101–111)
CHLORIDE: 83 mmol/L — AB (ref 101–111)
CHLORIDE: 84 mmol/L — AB (ref 101–111)
CREATININE: 1.9 mg/dL — AB (ref 0.61–1.24)
CREATININE: 1.8 mg/dL — AB (ref 0.61–1.24)
CREATININE: 2.4 mg/dL — AB (ref 0.61–1.24)
CREATININE: 2.3 mg/dL — AB (ref 0.61–1.24)
Calcium, Ion: 0.96 mmol/L — ABNORMAL LOW (ref 1.13–1.30)
Calcium, Ion: <0.3 mmol/L — CL (ref 1.13–1.30)
Calcium, Ion: 0.98 mmol/L — ABNORMAL LOW (ref 1.13–1.30)
Calcium, Ion: <0.3 mmol/L — CL (ref 1.13–1.30)
Chloride: 82 mmol/L — ABNORMAL LOW (ref 101–111)
Chloride: 83 mmol/L — ABNORMAL LOW (ref 101–111)
Chloride: 87 mmol/L — ABNORMAL LOW (ref 101–111)
Creatinine, Ser: 2.6 mg/dL — ABNORMAL HIGH (ref 0.61–1.24)
Creatinine, Ser: 1.7 mg/dL — ABNORMAL HIGH (ref 0.61–1.24)
GLUCOSE: 203 mg/dL — AB (ref 65–99)
GLUCOSE: 143 mg/dL — AB (ref 65–99)
Glucose, Bld: 121 mg/dL — ABNORMAL HIGH (ref 65–99)
Glucose, Bld: 128 mg/dL — ABNORMAL HIGH (ref 65–99)
Glucose, Bld: 99 mg/dL (ref 65–99)
Glucose, Bld: 120 mg/dL — ABNORMAL HIGH (ref 65–99)
HCT: 31 % — ABNORMAL LOW (ref 39.0–52.0)
HEMATOCRIT: 28 % — AB (ref 39.0–52.0)
HEMATOCRIT: 31 % — AB (ref 39.0–52.0)
HEMATOCRIT: 32 % — AB (ref 39.0–52.0)
HEMATOCRIT: 35 % — AB (ref 39.0–52.0)
HEMATOCRIT: 40 % (ref 39.0–52.0)
HEMOGLOBIN: 9.5 g/dL — AB (ref 13.0–17.0)
HEMOGLOBIN: 11.9 g/dL — AB (ref 13.0–17.0)
HEMOGLOBIN: 13.6 g/dL (ref 13.0–17.0)
Hemoglobin: 10.5 g/dL — ABNORMAL LOW (ref 13.0–17.0)
Hemoglobin: 10.5 g/dL — ABNORMAL LOW (ref 13.0–17.0)
Hemoglobin: 10.9 g/dL — ABNORMAL LOW (ref 13.0–17.0)
POTASSIUM: 3.4 mmol/L — AB (ref 3.5–5.1)
POTASSIUM: 3.4 mmol/L — AB (ref 3.5–5.1)
POTASSIUM: 3.5 mmol/L (ref 3.5–5.1)
Potassium: 3.6 mmol/L (ref 3.5–5.1)
Potassium: 3.6 mmol/L (ref 3.5–5.1)
Potassium: 3.5 mmol/L (ref 3.5–5.1)
SODIUM: 137 mmol/L (ref 135–145)
SODIUM: 138 mmol/L (ref 135–145)
SODIUM: 137 mmol/L (ref 135–145)
SODIUM: 136 mmol/L (ref 135–145)
SODIUM: 142 mmol/L (ref 135–145)
Sodium: 141 mmol/L (ref 135–145)
TCO2: 40 mmol/L (ref 0–100)
TCO2: 43 mmol/L (ref 0–100)
TCO2: 40 mmol/L (ref 0–100)
TCO2: 41 mmol/L (ref 0–100)
TCO2: 39 mmol/L (ref 0–100)
TCO2: 42 mmol/L (ref 0–100)

## 2015-07-26 LAB — POCT I-STAT 3, ART BLOOD GAS (G3+)
ACID-BASE EXCESS: 23 mmol/L — AB (ref 0.0–2.0)
Acid-Base Excess: 22 mmol/L — ABNORMAL HIGH (ref 0.0–2.0)
Acid-Base Excess: 21 mmol/L — ABNORMAL HIGH (ref 0.0–2.0)
BICARBONATE: 44.7 meq/L — AB (ref 20.0–24.0)
Bicarbonate: 46 mEq/L — ABNORMAL HIGH (ref 20.0–24.0)
Bicarbonate: 46.4 mEq/L — ABNORMAL HIGH (ref 20.0–24.0)
O2 SAT: 93 %
O2 Saturation: 96 %
O2 Saturation: 97 %
PCO2 ART: 44.5 mmHg (ref 35.0–45.0)
PH ART: 7.61 — AB (ref 7.350–7.450)
PO2 ART: 56 mmHg — AB (ref 80.0–100.0)
PO2 ART: 76 mmHg — AB (ref 80.0–100.0)
Patient temperature: 98
TCO2: 47 mmol/L (ref 0–100)
TCO2: 48 mmol/L (ref 0–100)
TCO2: 46 mmol/L (ref 0–100)
pCO2 arterial: 44.1 mmHg (ref 35.0–45.0)
pCO2 arterial: 45.3 mmHg — ABNORMAL HIGH (ref 35.0–45.0)
pH, Arterial: 7.625 (ref 7.350–7.450)
pH, Arterial: 7.618 (ref 7.350–7.450)
pO2, Arterial: 65 mmHg — ABNORMAL LOW (ref 80.0–100.0)

## 2015-07-26 LAB — DIFFERENTIAL
Band Neutrophils: 0 %
Basophils Absolute: 0 10*3/uL (ref 0.0–0.1)
Basophils Relative: 0 %
Blasts: 0 %
Eosinophils Absolute: 0 10*3/uL (ref 0.0–0.7)
Eosinophils Relative: 0 %
Lymphocytes Relative: 1 %
Lymphs Abs: 0.3 10*3/uL — ABNORMAL LOW (ref 0.7–4.0)
Metamyelocytes Relative: 0 %
Monocytes Absolute: 0.8 10*3/uL (ref 0.1–1.0)
Monocytes Relative: 3 %
Myelocytes: 0 %
Neutro Abs: 25.1 10*3/uL — ABNORMAL HIGH (ref 1.7–7.7)
Neutrophils Relative %: 96 %
Other: 0 %
Promyelocytes Absolute: 0 %
nRBC: 2 /100 WBC — ABNORMAL HIGH

## 2015-07-26 LAB — RENAL FUNCTION PANEL
ANION GAP: 9 (ref 5–15)
Albumin: 1.9 g/dL — ABNORMAL LOW (ref 3.5–5.0)
BUN: 48 mg/dL — ABNORMAL HIGH (ref 6–20)
CHLORIDE: 90 mmol/L — AB (ref 101–111)
CO2: 39 mmol/L — AB (ref 22–32)
CREATININE: 2.17 mg/dL — AB (ref 0.61–1.24)
Calcium: 8.6 mg/dL — ABNORMAL LOW (ref 8.9–10.3)
GFR, EST AFRICAN AMERICAN: 34 mL/min — AB (ref >60)
GFR, EST NON AFRICAN AMERICAN: 30 mL/min — AB (ref >60)
Glucose, Bld: 102 mg/dL — ABNORMAL HIGH (ref 65–99)
POTASSIUM: 3.9 mmol/L (ref 3.5–5.1)
Phosphorus: 3.4 mg/dL (ref 2.5–4.6)
Sodium: 138 mmol/L (ref 135–145)

## 2015-07-26 LAB — CARBOXYHEMOGLOBIN
Carboxyhemoglobin: 2.1 % — ABNORMAL HIGH (ref 0.5–1.5)
Methemoglobin: 0.3 % (ref 0.0–1.5)
O2 Saturation: 56.8 %
Total hemoglobin: 10.1 g/dL — ABNORMAL LOW (ref 13.5–18.0)

## 2015-07-26 LAB — CBC
HCT: 25.4 % — ABNORMAL LOW (ref 39.0–52.0)
Hemoglobin: 8.6 g/dL — ABNORMAL LOW (ref 13.0–17.0)
MCH: 29.1 pg (ref 26.0–34.0)
MCHC: 33.9 g/dL (ref 30.0–36.0)
MCV: 85.8 fL (ref 78.0–100.0)
Platelets: 108 10*3/uL — ABNORMAL LOW (ref 150–400)
RBC: 2.96 MIL/uL — ABNORMAL LOW (ref 4.22–5.81)
RDW: 21.7 % — ABNORMAL HIGH (ref 11.5–15.5)
WBC: 26.2 10*3/uL — ABNORMAL HIGH (ref 4.0–10.5)

## 2015-07-26 LAB — AEROBIC CULTURE W GRAM STAIN (SUPERFICIAL SPECIMEN): Culture: NO GROWTH

## 2015-07-26 LAB — COMPREHENSIVE METABOLIC PANEL
ALT: 237 U/L — ABNORMAL HIGH (ref 17–63)
AST: 110 U/L — ABNORMAL HIGH (ref 15–41)
Albumin: 1.9 g/dL — ABNORMAL LOW (ref 3.5–5.0)
Alkaline Phosphatase: 161 U/L — ABNORMAL HIGH (ref 38–126)
Anion gap: 14 (ref 5–15)
BUN: 47 mg/dL — ABNORMAL HIGH (ref 6–20)
CO2: 39 mmol/L — ABNORMAL HIGH (ref 22–32)
Calcium: 9.2 mg/dL (ref 8.9–10.3)
Chloride: 85 mmol/L — ABNORMAL LOW (ref 101–111)
Creatinine, Ser: 2.16 mg/dL — ABNORMAL HIGH (ref 0.61–1.24)
GFR calc Af Amer: 35 mL/min — ABNORMAL LOW (ref >60)
GFR calc non Af Amer: 30 mL/min — ABNORMAL LOW (ref >60)
Glucose, Bld: 113 mg/dL — ABNORMAL HIGH (ref 65–99)
Potassium: 3.7 mmol/L (ref 3.5–5.1)
Sodium: 138 mmol/L (ref 135–145)
Total Bilirubin: 24.2 mg/dL (ref 0.3–1.2)
Total Protein: 5.2 g/dL — ABNORMAL LOW (ref 6.5–8.1)

## 2015-07-26 LAB — PREALBUMIN: Prealbumin: 19.2 mg/dL (ref 18–38)

## 2015-07-26 LAB — TRIGLYCERIDES: Triglycerides: 258 mg/dL — ABNORMAL HIGH (ref <150)

## 2015-07-26 LAB — CULTURE, RESPIRATORY W GRAM STAIN
Culture: NO GROWTH
Special Requests: NORMAL

## 2015-07-26 LAB — CALCIUM, IONIZED
Calcium, Ionized, Serum: 4.7 mg/dL (ref 4.5–5.6)
Calcium, Ionized, Serum: 4.6 mg/dL (ref 4.5–5.6)

## 2015-07-26 LAB — PHOSPHORUS: Phosphorus: 3.6 mg/dL (ref 2.5–4.6)

## 2015-07-26 LAB — MAGNESIUM: Magnesium: 1.9 mg/dL (ref 1.7–2.4)

## 2015-07-26 LAB — AEROBIC CULTURE  (SUPERFICIAL SPECIMEN)

## 2015-07-26 MED ORDER — VECURONIUM BROMIDE 10 MG IV SOLR
10.0000 mg | Freq: Once | INTRAVENOUS | Status: AC
  Administered 2015-07-27: 10 mg via INTRAVENOUS

## 2015-07-26 MED ORDER — POTASSIUM CHLORIDE 10 MEQ/50ML IV SOLN
10.0000 meq | INTRAVENOUS | Status: AC
  Administered 2015-07-26 (×2): 10 meq via INTRAVENOUS
  Filled 2015-07-26: qty 50

## 2015-07-26 MED ORDER — ZINC TRACE METAL 1 MG/ML IV SOLN
INTRAVENOUS | Status: AC
  Administered 2015-07-26: 18:00:00 via INTRAVENOUS
  Filled 2015-07-26: qty 1440

## 2015-07-26 MED ORDER — PROPOFOL 500 MG/50ML IV EMUL: 50.0000 mL | Freq: Once | INTRAVENOUS | Status: DC

## 2015-07-26 MED ORDER — LEVALBUTEROL HCL 1.25 MG/0.5ML IN NEBU
1.2500 mg | INHALATION_SOLUTION | Freq: Four times a day (QID) | RESPIRATORY_TRACT | Status: DC
  Administered 2015-07-26 – 2015-08-10 (×61): 1.25 mg via RESPIRATORY_TRACT
  Filled 2015-07-26 (×60): qty 0.5

## 2015-07-26 MED ORDER — INSULIN ASPART 100 UNIT/ML ~~LOC~~ SOLN: 0.0000 [IU] | Freq: Four times a day (QID) | SUBCUTANEOUS | Status: DC

## 2015-07-26 MED ORDER — PANTOPRAZOLE SODIUM 40 MG IV SOLR
40.0000 mg | INTRAVENOUS | Status: DC
  Administered 2015-07-26 – 2015-08-04 (×10): 40 mg via INTRAVENOUS
  Filled 2015-07-26 (×10): qty 40

## 2015-07-26 MED ORDER — HEPARIN SODIUM (PORCINE) 1000 UNIT/ML DIALYSIS
1000.0000 [IU] | INTRAMUSCULAR | Status: DC | PRN
  Filled 2015-07-26: qty 6

## 2015-07-26 MED ORDER — AMIODARONE HCL IN DEXTROSE 360-4.14 MG/200ML-% IV SOLN
15.0000 mg/h | INTRAVENOUS | Status: DC
  Administered 2015-07-26 – 2015-07-28 (×6): 30 mg/h via INTRAVENOUS
  Administered 2015-07-28: 15 mg/h via INTRAVENOUS
  Filled 2015-07-26 (×6): qty 200

## 2015-07-26 MED ORDER — ETOMIDATE 2 MG/ML IV SOLN
40.0000 mg | Freq: Once | INTRAVENOUS | Status: AC
  Administered 2015-07-27: 20 mg via INTRAVENOUS

## 2015-07-26 MED ORDER — INSULIN GLARGINE 100 UNIT/ML ~~LOC~~ SOLN
15.0000 [IU] | Freq: Two times a day (BID) | SUBCUTANEOUS | Status: DC
  Administered 2015-07-26 – 2015-07-27 (×4): 15 [IU] via SUBCUTANEOUS
  Filled 2015-07-26 (×7): qty 0.15

## 2015-07-26 MED ORDER — SODIUM CHLORIDE 0.9 % IV SOLN
INTRAVENOUS | Status: DC
  Administered 2015-07-26 – 2015-07-31 (×3): via INTRAVENOUS
  Administered 2015-08-07: 20 mL/h via INTRAVENOUS

## 2015-07-26 MED ORDER — FENTANYL CITRATE (PF) 100 MCG/2ML IJ SOLN
200.0000 ug | Freq: Once | INTRAMUSCULAR | Status: DC
  Filled 2015-07-26: qty 4

## 2015-07-26 MED ORDER — MIDAZOLAM HCL 2 MG/2ML IJ SOLN
4.0000 mg | Freq: Once | INTRAMUSCULAR | Status: AC
  Administered 2015-07-27: 2 mg via INTRAVENOUS
  Filled 2015-07-26: qty 4

## 2015-07-26 MED ORDER — FLUCONAZOLE IN SODIUM CHLORIDE 100-0.9 MG/50ML-% IV SOLN
100.0000 mg | INTRAVENOUS | Status: AC
  Administered 2015-07-26 – 2015-07-27 (×2): 100 mg via INTRAVENOUS
  Filled 2015-07-26 (×7): qty 50

## 2015-07-26 MED ORDER — SODIUM BICARBONATE 8.4 % IV SOLN
INTRAVENOUS | Status: DC
  Filled 2015-07-26 (×3): qty 150

## 2015-07-26 NOTE — Plan of Care (Signed)
Dr. Darcey Nora in and aware of trach tomorrow. Daughter signed consent.

## 2015-07-26 NOTE — Progress Notes (Signed)
  [facility] PCCM  Awaiting response from TCVS Dr. Nils Pyle if OK to do perc trache tomorrow am.  I also called daughter to get consent > no answer. Spoke to bedside RN.  If TCVS and daughter OK for trache, RN to call Warren Lacy and Warren Lacy will place NPO order and let Dr. Unknown Jim know. Dr. Unknown Jim has tentatively scheduled pt for perc trache tomorrow at [place], MD 07/26/2015, 3:53 PM [facility] Pager [phone] After 3 pm or if no answer, call [phone]

## 2015-07-26 NOTE — Progress Notes (Signed)
[facility] NOTE  Pharmacy Consult for TPN Indication: increased residuals  Allergies  Allergen Reactions  . Sulfa Antibiotics Hives and Itching  . Morphine And Related     Makes patient feel weird   Patient Measurements: Height: '5\' 11"'$  (180.3 cm) Weight: 233 lb 4 oz (105.8 kg) IBW/kg (Calculated) : 75.3  Vital Signs: Temp: 98 F (36.7 C) (06/19 0409) Temp Source: Axillary (06/19 0409) BP: 158/56 mmHg (06/19 0755) Pulse Rate: 87 (06/19 0915) Intake/Output from previous day: 06/18 0701 - 06/19 0700 In: 5680.4 [I.V.:3732.4; NG/GT:100; IV Piggyback:500; TPN:1348] Out: 5603 [Emesis/NG output:500; Chest Tube:350] Intake/Output from this shift: Total I/O In: 650 [I.V.:410; IV Piggyback:100; TPN:140] Out: 567 [Other:567]  Labs:  Recent Labs  07/22/2015 1100 07/24/15 0430  07/25/15 0355  07/26/15 0453 07/26/15 0646 07/26/15 0648  WBC 19.3* 21.3*  --  31.8*  --  26.2*  --   --   HGB 8.0*  8.8* 8.4*  < > 8.7*  < > 8.6* 10.9* 10.5*  HCT 24.0*  26.0* 25.4*  < > 25.8*  < > 25.4* 32.0* 31.0*  PLT 60* 90*  --  128*  --  108*  --   --   APTT 34 38*  --   --   --   --   --   --   INR 1.86* 2.10*  --  1.89*  --   --   --   --   < > = values in this interval not displayed.   Recent Labs  07/24/15 0430  07/25/15 0355  07/25/15 1612  07/26/15 0453 07/26/15 0646 07/26/15 0648  NA 137  < > 138  < > 138  < > 138 141 137  K 4.7  < > 3.7  < > 4.0  < > 3.7 3.4* 3.5  CL 92*  < > 89*  < > 87*  < > 85* 84* 83*  CO2 29  < > 31  --  36*  --  39*  --   --   GLUCOSE 131*  < > 140*  < > 132*  < > 113* 128* 99  BUN 75*  < > 66*  < > 55*  < > 47* 34* 43*  CREATININE 4.33*  < > 3.24*  < > 2.57*  < > 2.16* 1.80* 2.40*  CALCIUM 8.5*  < > 9.3  --  9.3  --  9.2  --   --   MG 2.6*  --  2.3  --   --   --  1.9  --   --   PHOS 5.9*  < > 5.2*  --  4.6  --  3.6  --   --   PROT 5.3*  --  5.7*  --   --   --  5.2*  --   --   ALBUMIN 2.1*  < > 2.1*  --  1.9*  --  1.9*  --   --    AST 96*  --  104*  --   --   --  110*  --   --   ALT 358*  --  296*  --   --   --  237*  --   --   ALKPHOS 148*  --  154*  --   --   --  161*  --   --   BILITOT 32.0*  --  36.8*  --   --   --  24.2*  --   --  PREALBUMIN  --   --  15.0*  --   --   --  19.2  --   --   TRIG  --   --  188*  --   --   --  258*  --   --   < > = values in this interval not displayed. Estimated Creatinine Clearance: 37 mL/min (by C-G formula based on Cr of 2.4).    Recent Labs  07/26/15 0604 07/26/15 0646 07/26/15 0739  GLUCAP 110* 99 85    Medical History: Past Medical History  Diagnosis Date  . Hypertensive heart disease   . Hypercholesterolemia   . Prostate cancer ([place])   . Hepatosplenomegaly 2015    fibrosis, no cirrhosis on 06/2013 liver biopsy  . Thrombocytopenia ([place]) 2015  . Depression with anxiety   . IDDM (insulin dependent diabetes mellitus) ([place])     INSULIN DEPENDENT  . GERD (gastroesophageal reflux disease)   . History of shingles   . Ischemic cardiomyopathy     a. echo 08/06/2014: EF 25-30%, multiple WMA, mod DD, PASP 49 mm Hg;  b. 12/2014 Echo: EF 30-35%, diff HK, mild MR.; Echo 03/2015 - ? LV thrombus (not seen in 3/27 - but EF ? 40-45%.   . CKD (chronic kidney disease), stage IV ([place])     a. previously on dialysis 11/2013->04/2014;  b. 12/2014 Dialysis resumed.  . Coronary artery disease, occlusive     a. 2001 MI s/p 4v CABG (LIMA-LAD, SVG-D2, SVG-OM1, SVG-right PDA, SVG-right PL1); b. cath 07/30/2014 vein grafts all down, patent LIMA to LAD, significant LM and ost LCx dx; c. 08/07/2014 PCI Synergy DES 2.5 mm x 18 mm to dist LM and ost LCx, rec lifelong DAPT.; 02/2015 - Patent LIMA-LAD with L-R Collaterals, 60% ISR of LM-Cx stent (non-occlusive)  . Chronic combined systolic and diastolic CHF, NYHA class 2 ([place])     a. 08/2013 Echo: EF 40-45%, impaired relaxation;  b. 07/2014 Echo: EF 25-30%;  c. 12/2014 Echo: EF 30-35%, diff HK, mild MR.  . Gallstone pancreatitis 2015  . ESRD (end stage  renal disease) ([place])    Admit:  70yo male with significant PMH of CAD presents with STEMI with continued CP. TPN for increased residuals  Insulin Requirements in the past 24 hours:  Ins drip >> Lantus 15 units QHS per CVTS + 10 units ins in TPN bag No SSI ordered  Surgeries/Procedures: 6/8 Redo sternotomy, CABG - IABP - now out  GI: Jaundice - shock liver -- Albumin 1.9, AST/ALT elevated but trending down, alk phos up 161. TG 188>258. Tbili = 36.8>24.2. NGT with 789m/output - Pepcid, reglan, PPI IV  Endo: CBG's 88-122 (trending low) on ins drip (transitioning to lantus per CVTS), no SSI  Lytes: Electrolytes WNL - getting Calcium Gluc infusion with CRRT. CVTS ordered 2 k runs 6/19  Renal: ESRD - Anuric - CVVHD ongoing - will use Clinimix without electrolytes and follow renal for replacement  Pulm: Intubated postop due to acute hypoxemic failure - CXR = now with small pneumothroax, atelectasis decreasing. Will need Trach per CCM -Xopenex  Cards: Hx. CAD - CABG 2001, CHF/PCI, Dyslipidemia - NSR - Remains on Epinephrine at 353m/min - Milrinone at 0.12557mkg/min. Amio gtt started 6/19  Heme: Hypercoagulable. Darbo every Friday - surgery transfusions - RBC #18, Cryo - 6units, Plasma -8 units, platelets 11 units. Has continued to require ongoing support for oozing and thrombocytopenia issues - Hg stable, Platelets down 108  Neuro: Intubated and sedated with  Fent/Versed (weaning)  ID: ?HCAP - empiric antibiotics.  WBC - wbc down 26.2. PCT 12.6>>7.21, low temps (CRRT). Cultures neg to date 6/8 Vanc>>6/16; 6/1 >>  6/10 Zosyn>>6/16 Primaxin 6/16>>> Fluconazole 6/19>>  Best Practices: SCDs, MC, PPI/H2B  TPN Access: CVC 6/16 TPN start date: 6/17>>  Current Nutrition:  NPO Clinimix 5/15 NO LYTES at 74m/h + lipids at 152mh - provides 1500 kcal + 72g protein  Fluids: NS at 10 ml/h  Nutritional Goals:  1315-1673 Kcal and 150-160gm protein - RD assessment  Plan:  -Continue  Clinimix 5/15 NO LYTES (CRRT) at 6068m with CBGs trending low. Will remove lipids for now w/ pt in the ICU, TG trending up. Titrate to goal rate as tolerated. Provides 72g protein + 1022 kcal. Will be unable to meet protein needs due to limitations of Clinimix -Add MVI to TPN bag -Remove trace elements; add back Zn '5mg'$ /d + chromium 6m41m + selenium 60mc64m-Take ins out of TPN bag, add SSI-sensitive q6h and monitor CBGs -Add Pepcid '20mg'$  to TPN to decrease fluid -F/u AM labs   HaleyElicia LamprmD, BCPS [facility] Pharmacist Pager [phone] 9:28 AM

## 2015-07-26 NOTE — Progress Notes (Signed)
TCTS BRIEF SICU PROGRESS NOTE  3 Days Post-Op  S/P Procedure(s) (LRB): STERNAL CLOSURE WITH PUMP STANDBY (N/A) TRANSESOPHAGEAL ECHOCARDIOGRAM (TEE) (N/A)   Unresponsive, sedated on vent Sinus rhythm Remains on high dose Epi drip and milrinonoe O2 sats 100% on 40% FiO2 Tolerating CVVHD  Plan: Continue current plan  Rexene Alberts, MD 07/26/2015 7:07 PM

## 2015-07-26 NOTE — Progress Notes (Signed)
PULMONARY / CRITICAL CARE MEDICINE   Name: DEANNA HANISH MRN: [id number] DOB: 1945/07/04    ADMISSION DATE:  06/20/2015 CONSULTATION DATE:  07/16/2015  REFERRING MD:  Nils Pyle  CHIEF COMPLAINT:  Post cardiothoracic surgery ventilator management  HISTORY OF PRESENT ILLNESS:   70 y/o male with ESRD and significant CAD (CABG 2001, DES 2016, EF 40-45%) was admitted on 5/29 with NSTEMI and had persistent chest pain during his hospitalization so he was taken for a redo CABG on 6/8 (SVG to diag and SVG to OM, IABP placement and CVL placement).  Post operatively he had excessive bleeding from all chest drains (pleural and mediastinal) so he was taken back to the OR on 6/9 for re-exploration.  PCCM was consulted for ventilator management.    SUBJECTIVE:  Remains critically ill, jaundiced, no events over the weekend. Frequent PVCs > started on amio drip. Tolerating cvvh  VITAL SIGNS: BP 158/56 mmHg  Pulse 85  Temp(Src) 98 F (36.7 C) (Axillary)  Resp 26  Ht 5\' 11"  (1.803 m)  Wt 105.8 kg (233 lb 4 oz)  BMI 32.55 kg/m2  SpO2 92%  HEMODYNAMICS: CVP:  [13 mmHg-17 mmHg] 17 mmHg  VENTILATOR SETTINGS: Vent Mode:  [-] PRVC FiO2 (%):  [40 %] 40 % Set Rate:  [24 bmp] 24 bmp Vt Set:  [550 mL] 550 mL PEEP:  [5 cmH20] 5 cmH20 Plateau Pressure:  [20 [id number] cmH20] 20 cmH20  INTAKE / OUTPUT: I/O last 3 completed shifts: In: 8550.2 [I.V.:5820.2; NG/GT:190; IV Piggyback:700] Out: [id number] [Emesis/NG output:650; II:2016032; Chest Tube:440]  PHYSICAL EXAMINATION: General:  Critically ill on vent, CVVHD, jaundiced. Icteric.  Neuro:  Sedated. (-) purposeful mm noted.  HEENT:  Diffuse facial edema improved, ETT in place Cardiovascular:  median sternotomy dressing dry, decreased drainage out of mediastinal and pleural tubes. Tachycardic. Variable s1. (-) s3/m/r/g Lungs:  resps even non labored on vent, rales throughout, diminished bibasilar  Abdomen:  Distended, some focal superficial bruising, dec  BS Musculoskeletal:  No bony abnormalitiies Skin:  Bruising all over extensor surfaces and belly, generalized 2++ edema  LABS:  BMET  Recent Labs Lab 07/25/15 0355  07/25/15 1612  07/26/15 0453 07/26/15 0646 07/26/15 0648  NA 138  < > 138  < > 138 141 137  K 3.7  < > 4.0  < > 3.7 3.4* 3.5  CL 89*  < > 87*  < > 85* 84* 83*  CO2 31  --  36*  --  39*  --   --   BUN 66*  < > 55*  < > 47* 34* 43*  CREATININE 3.24*  < > 2.57*  < > 2.16* 1.80* 2.40*  GLUCOSE 140*  < > 132*  < > 113* 128* 99  < > = values in this interval not displayed.  Electrolytes  Recent Labs Lab 07/24/15 0430  07/25/15 0355 07/25/15 1612 07/26/15 0453  CALCIUM 8.5*  < > 9.3 9.3 9.2  MG 2.6*  --  2.3  --  1.9  PHOS 5.9*  < > 5.2* 4.6 3.6  < > = values in this interval not displayed.  CBC  Recent Labs Lab 07/24/15 0430  07/25/15 0355  07/26/15 0453 07/26/15 0646 07/26/15 0648  WBC 21.3*  --  31.8*  --  26.2*  --   --   HGB 8.4*  < > 8.7*  < > 8.6* 10.9* 10.5*  HCT 25.4*  < > 25.8*  < > 25.4* 32.0* 31.0*  PLT 90*  --  128*  --  108*  --   --   < > = values in this interval not displayed.  Coag's  Recent Labs Lab 07/29/2015 1100 07/24/15 0430 07/25/15 0355  APTT 34 38*  --   INR 1.86* 2.10* 1.89*   Sepsis Markers  Recent Labs Lab 07/25/2015 1100 07/24/15 0430 07/25/15 0355  PROCALCITON 11.41 12.60 7.21   ABG  Recent Labs Lab 07/24/15 0418 07/25/15 0358 07/26/15 0405  PHART 7.538* 7.563* 7.625*  PCO2ART 37.7 41.4 44.1  PO2ART 74.0* 70.0* 65.0*    Liver Enzymes  Recent Labs Lab 07/24/15 0430  07/25/15 0355 07/25/15 1612 07/26/15 0453  AST 96*  --  104*  --  110*  ALT 358*  --  296*  --  237*  ALKPHOS 148*  --  154*  --  161*  BILITOT 32.0*  --  36.8*  --  24.2*  ALBUMIN 2.1*  < > 2.1* 1.9* 1.9*  < > = values in this interval not displayed.  Cardiac Enzymes No results for input(s): TROPONINI, PROBNP in the last 168 hours.  Glucose  Recent Labs Lab  07/26/15 0251 07/26/15 0404 07/26/15 0459 07/26/15 0604 07/26/15 0646 07/26/15 0739  GLUCAP 117* 114* 114* 110* 99 85    Imaging Dg Chest Port 1 View  07/26/2015  CLINICAL DATA:  Intubation. EXAM: PORTABLE CHEST 1 VIEW COMPARISON:  07/25/2015. FINDINGS: Endotracheal tube tip 5 cm above the carina. NG tube tip below left hemidiaphragm. Left IJ line stable position. Bilateral chest tubes in stable position. Prior CABG. Cardiomegaly with pulmonary vascular prominence and bilateral interstitial prominence consistent congestive heart failure. Bibasilar atelectasis and/or infiltrates/ edema noted. Interim near complete resolution of small left pneumothorax. Surgical staples right upper chest. IMPRESSION: 1. Lines and tubes including bilateral chest tubes in stable position. Interim near complete resolution of small left pneumothorax. 2. Prior CABG. Persistent cardiomegaly with pulmonary venous congestion and bilateral interstitial prominence suggesting congestive heart failure. Bibasilar atelectasis and/or infiltrates/edema noted. These findings have progressed slightly from prior exam. Electronically Signed   By: [place]   On: 07/26/2015 07:30   STUDIES:  6/14 TTE >EF 30-35%  CULTURES: Sputum 6/10 >> neg  ANTIBIOTICS: 6/8 Vanc >>6/16; 6/18 >>  6/10 Zosyn >>6/16 Primaxin 6/16>>>  SIGNIFICANT EVENTS: 6/8 Redo CABG 6/8 take back to OR emergently for diffuse bleeding 6/16 back to OR to close sternum  LINES/TUBES: Left forearm fistula 6/8 R IJ introducer >> 6/8 Multiple chest tubes (mediastinal and pleural bilateral) >> 6/8 CVC left femoral vein >>out 6/8 R radial arterial line >>6/17>>>6/17>>> 6/8 L IJ [place] >>6/17 L [place] TLC 6/16>>> ETT 6/8>>>  DISCUSSION: 70 y/o male s/p redo CABG 6/8 complicated by significant post operative bleeding in the setting of thrombocytopenia brought back to the Surgical ICU on 6/9 with hypoxemic respiratory failure.  He has baseline ESRD and  DM2.  ASSESSMENT / PLAN:  PULMONARY A: Acute hypoxemic respiratory failure 2/2  Pulm edema Post operative pleural bleeding Respiratory acidosis - suspect mostly V/Q mismatch from cardiogenic shock, improved Small R Pleural Effusion P:   Cont vent support. Pt is alkalemic with a pH of 7.6. Will dec MV and check abg. Goal is pH 7.4-7.45 Continue CRRT FiO2 40% post op and PEEP 5. Intermittent CXR Chest tubes per thoracic surgery, replaced CT per surgery. No weaning due to hemodynamics/sensorium Will need a trache.  Will ask if TCVS or PCCM will do it.   CARDIOVASCULAR A:  Severe Cardiogenic and hemorrhagic shock  CAD s/p redo CABG\ IABP d/c'd on 6/18 fPVCs P:  D/C levophed. Epi to 12 Pacing per TCTS. Tele monitoring. Started amiodarone drip 6/19  RENAL A:   ESRD CKD V P:   Continue CVVHD per renal -- tolerating volume removal on pressors, -150 ml/hr. Renal following Replace electrolytes as indicated.  GASTROINTESTINAL A:   Jaundice and coagulopathy due to shocked liver. P:   OG tube TPN per pharmacy due to high residuals. H2 blocker for stress ulcer prophylaxis Abdominal U/S negative for obstruction, likely shocked liver.  HEMATOLOGIC A:   Diffuse oozing due to coagulopathy from blood loss, thrombocytopenia. Improved.  P:  Transfuse per TCTS guidelines. Trend CBC, coags.  INFECTIOUS A:   ?HCAP - doubt, respiratory culture negative P:   Empiric abx primaxin Monitor fever curve / WBC. PCT elevated on 6/18 at 7.21  ENDOCRINE A:   DM2   P:   Monitor glucose Insulin gtt per TCTS post op protocol  NEUROLOGIC A:   Sedation needs for vent synchrony P:   RASS goal: -2 Fentanyl gtt  Versed gtt > try to wean off versed.   FAMILY  - Updates:  No family bedside 6/19.  Will need trach if we are to continue support, easily doable bedside when CVTS is ready.  - Inter-disciplinary family meet or Palliative Care meeting due by:  day 7  Critical care time  with this patient today: 32 minutes.   Monica Becton, MD 07/26/2015, 8:55 AM [facility] Pager [phone] After 3 pm or if no answer, call [phone]

## 2015-07-26 NOTE — Progress Notes (Signed)
3 Days Post-Op Procedure(s) (LRB): STERNAL CLOSURE WITH PUMP STANDBY (N/A) TRANSESOPHAGEAL ECHOCARDIOGRAM (TEE) (N/A)  Back on CRT WBC 25 k- on vanc, imipenum, diflucan Lfts, bilirubin getting better No purposeful movement- will need perc trach this week  Objective: Vital signs in last 24 hours: Temp:  [96 F (35.6 C)-98.2 F (36.8 C)] 98 F (36.7 C) (06/19 0409) Pulse Rate:  [74-94] 91 (06/19 0755) Cardiac Rhythm:  [-] Normal sinus rhythm (06/18 2000) Resp:  [20-35] 31 (06/19 0755) BP: (107-158)/(49-56) 158/56 mmHg (06/19 0755) SpO2:  [94 %-100 %] 100 % (06/19 0715) Arterial Line BP: (92-303)/(42-72) 125/51 mmHg (06/19 0715) FiO2 (%):  [40 %] 40 % (06/19 0755) Weight:  [233 lb 4 oz (105.8 kg)] 233 lb 4 oz (105.8 kg) (06/19 0500)  Hemodynamic parameters for last 24 hours: CVP:  [13 mmHg-17 mmHg] 17 mmHg  Intake/Output from previous day: 06/18 0701 - 06/19 0700 In: 5680.4 [I.V.:3732.4; NG/GT:100; IV Piggyback:500; TPN:1348] Out: 5603 [Emesis/NG output:500; Chest Tube:350] Intake/Output this shift:    Coarse breath sounds extrem warm  Lab Results:  Recent Labs  07/25/15 0355  07/26/15 0453 07/26/15 0646 07/26/15 0648  WBC 31.8*  --  26.2*  --   --   HGB 8.7*  < > 8.6* 10.9* 10.5*  HCT 25.8*  < > 25.4* 32.0* 31.0*  PLT 128*  --  108*  --   --   < > = values in this interval not displayed. BMET:  Recent Labs  07/25/15 1612  07/26/15 0453 07/26/15 0646 07/26/15 0648  NA 138  < > 138 141 137  K 4.0  < > 3.7 3.4* 3.5  CL 87*  < > 85* 84* 83*  CO2 36*  --  39*  --   --   GLUCOSE 132*  < > 113* 128* 99  BUN 55*  < > 47* 34* 43*  CREATININE 2.57*  < > 2.16* 1.80* 2.40*  CALCIUM 9.3  --  9.2  --   --   < > = values in this interval not displayed.  PT/INR:  Recent Labs  07/25/15 0355  LABPROT 21.6*  INR 1.89*   ABG    Component Value Date/Time   PHART 7.625* 07/26/2015 0405   HCO3 46.0* 07/26/2015 0405   TCO2 41 07/26/2015 0648   ACIDBASEDEF 0.6  07/21/2015 0415   O2SAT 56.8 07/26/2015 0406   CBG (last 3)   Recent Labs  07/26/15 0459 07/26/15 0604 07/26/15 0646  GLUCAP 114* 110* 99    Assessment/Plan: S/P Procedure(s) (LRB): STERNAL CLOSURE WITH PUMP STANDBY (N/A) TRANSESOPHAGEAL ECHOCARDIOGRAM (TEE) (N/A) Cont CRT TPN   LOS: 20 days    Tharon Aquas Trigt III 07/26/2015

## 2015-07-26 NOTE — Plan of Care (Signed)
Dr. Larkin Ina aware of ABG

## 2015-07-26 NOTE — Care Management Important Message (Signed)
Important Message  Patient Details  Name: Joseph [name] MRN: [id number] Date of Birth: [birth year]   Medicare Important Message Given:  Yes    Case Vassell Abena 07/26/2015, 11:09 AM

## 2015-07-26 NOTE — Progress Notes (Signed)
Assessment/ Plan: Pt is a 70 y.o. yo male with ESRD who was admitted on 06/18/2015 with CP and subsequent redo CABG that has been complicated by blood loss req transfusion and hemodynamic instability -s/p balloon pump, VDRF, shock, CRRT Assessment/Plan: 1. CAD- complicated redo CABG- on pressors, milrinone 2. ESRD - normally TTS [facility]- has been off and on CRRT here- resumed CRRT 6/17 via left groin HD cath (placed 6/8)- citrate protocol- also with left forearm AVF - pH 7.6 today so will stop citrate 3. Anemia- complicating issue- has required transfusions- trending up 4. HTN/volume- CRRT with volume removal, try 100-200 ml/hr 5. Inc LFTs- shock liver ? - trending better except bili   Subjective: Interval History: Unrepsonsive  Objective: Vital signs in last 24 hours: Temp:  [96 F (35.6 C)-98.2 F (36.8 C)] 98 F (36.7 C) (06/19 0409) Pulse Rate:  [81-94] 85 (06/19 1045) Resp:  [20-32] 29 (06/19 1045) BP: (107-158)/(49-56) 158/56 mmHg (06/19 0755) SpO2:  [92 %-100 %] 99 % (06/19 1045) Arterial Line BP: (92-303)/(42-72) 109/43 mmHg (06/19 1045) FiO2 (%):  [40 %] 40 % (06/19 0834) Weight:  [105.8 kg (233 lb 4 oz)] 105.8 kg (233 lb 4 oz) (06/19 0500) Weight change: -0.6 kg (-1 lb 5.2 oz)  Intake/Output from previous day: 06/18 0701 - 06/19 0700 In: 5680.4 [I.V.:3732.4; NG/GT:100; IV Piggyback:500; TPN:1348] Out: 5603 [Emesis/NG output:500; Chest Tube:350] Intake/Output this shift: Total I/O In: 914 [I.V.:554; IV Piggyback:150; TPN:210] Out: 935 [Other:935]  General appearance: unresponsive Head: Normocephalic, without obvious abnormality, atraumatic GI: soft, non-tender; bowel sounds normal; no masses,  no organomegaly Extremities: edema 1-2+ Skin: icteric  Lab Results:  Recent Labs  07/25/15 0355  07/26/15 0453  07/26/15 1017 07/26/15 1022  WBC 31.8*  --  26.2*  --   --   --   HGB 8.7*  < > 8.6*  < > 13.6 11.9*  HCT 25.8*  < > 25.4*  < > 40.0 35.0*   PLT 128*  --  108*  --   --   --   < > = values in this interval not displayed. BMET:  Recent Labs  07/25/15 1612  07/26/15 0453  07/26/15 1017 07/26/15 1022  NA 138  < > 138  < > 136 142  K 4.0  < > 3.7  < > 3.4* 3.5  CL 87*  < > 85*  < > 87* 83*  CO2 36*  --  39*  --   --   --   GLUCOSE 132*  < > 113*  < > 120* 143*  BUN 55*  < > 47*  < > 41* 35*  CREATININE 2.57*  < > 2.16*  < > 2.30* 1.70*  CALCIUM 9.3  --  9.2  --   --   --   < > = values in this interval not displayed. No results for input(s): PTH in the last 72 hours. Iron Studies: No results for input(s): IRON, TIBC, TRANSFERRIN, FERRITIN in the last 72 hours. Studies/Results: Dg Chest Port 1 View  07/26/2015  CLINICAL DATA:  Intubation. EXAM: PORTABLE CHEST 1 VIEW COMPARISON:  07/25/2015. FINDINGS: Endotracheal tube tip 5 cm above the carina. NG tube tip below left hemidiaphragm. Left IJ line stable position. Bilateral chest tubes in stable position. Prior CABG. Cardiomegaly with pulmonary vascular prominence and bilateral interstitial prominence consistent congestive heart failure. Bibasilar atelectasis and/or infiltrates/ edema noted. Interim near complete resolution of small left pneumothorax. Surgical staples right upper chest. IMPRESSION: 1. Lines  and tubes including bilateral chest tubes in stable position. Interim near complete resolution of small left pneumothorax. 2. Prior CABG. Persistent cardiomegaly with pulmonary venous congestion and bilateral interstitial prominence suggesting congestive heart failure. Bibasilar atelectasis and/or infiltrates/edema noted. These findings have progressed slightly from prior exam. Electronically Signed   By: [place]   On: 07/26/2015 07:30   Dg Chest Port 1 View  07/25/2015  CLINICAL DATA:  Jaundice.  Intubated. EXAM: PORTABLE CHEST 1 VIEW COMPARISON:  Chest radiograph 07/24/2015. FINDINGS: ET tube terminates in the mid trachea. Left subclavian central venous catheter tip  projects over the superior vena cava. Bilateral chest tubes are in place. Enteric tube courses inferior to the diaphragm. Stable cardiomegaly. Interval removal left subclavian central venous catheter. Low lung volumes. Basilar airspace opacities, unchanged. Small left pneumothorax. IMPRESSION: New small left pneumothorax with bilateral chest tubes in place. ET tube terminates in the mid trachea. Cardiomegaly. Stable bibasilar opacities favored represent atelectasis. Critical Value/emergent results were called by telephone at the time of interpretation on 07/25/2015 at 8:37 am to Nurse Lily Kocher, who verbally acknowledged these results. Electronically Signed   By: Lovey Newcomer M.D.   On: 07/25/2015 08:40    Scheduled: . sodium chloride   Intravenous Once  . sodium chloride   Intravenous Once  . antiseptic oral rinse  7 mL Mouth Rinse 10 times per day  . bisacodyl  10 mg Oral Daily   Or  . bisacodyl  10 mg Rectal Daily  . chlorhexidine gluconate (SAGE KIT)  15 mL Mouth Rinse BID  . darbepoetin (ARANESP) injection - NON-DIALYSIS  100 mcg Subcutaneous Q Fri-1800  . docusate sodium  200 mg Oral Daily  . feeding supplement (PRO-STAT SUGAR FREE 64)  60 mL Oral QID  . fluconazole (DIFLUCAN) IV  100 mg Intravenous Q24H  . imipenem-cilastatin  500 mg Intravenous Q8H  . insulin aspart  0-9 Units Subcutaneous Q6H  . insulin glargine  15 Units Subcutaneous BID  . insulin regular  0-10 Units Intravenous TID WC  . levalbuterol  1.25 mg Nebulization Q6H  . magnesium sulfate  4 g Intravenous Once  . metoCLOPramide (REGLAN) injection  10 mg Intravenous Q6H  . pantoprazole (PROTONIX) IV  40 mg Intravenous Q24H  . vancomycin  1,000 mg Intravenous Q24H    LOS: 20 days   Keysean Savino C 07/26/2015,10:56 AM

## 2015-07-26 NOTE — Plan of Care (Signed)
Wasted 240 cc fentanyl from expired drip in sink, witnessed by [facility] RN

## 2015-07-27 ENCOUNTER — Inpatient Hospital Stay (HOSPITAL_COMMUNITY): Payer: Medicare Other

## 2015-07-27 ENCOUNTER — Encounter (HOSPITAL_COMMUNITY): Payer: Self-pay | Admitting: Cardiothoracic Surgery

## 2015-07-27 ENCOUNTER — Encounter (HOSPITAL_COMMUNITY): Payer: Medicare Other

## 2015-07-27 DIAGNOSIS — L8990 Pressure ulcer of unspecified site, unspecified stage: Secondary | ICD-10-CM | POA: Insufficient documentation

## 2015-07-27 LAB — COMPREHENSIVE METABOLIC PANEL
ALT: 187 U/L — ABNORMAL HIGH (ref 17–63)
AST: 84 U/L — ABNORMAL HIGH (ref 15–41)
Albumin: 1.9 g/dL — ABNORMAL LOW (ref 3.5–5.0)
Alkaline Phosphatase: 195 U/L — ABNORMAL HIGH (ref 38–126)
Anion gap: 11 (ref 5–15)
BUN: 50 mg/dL — ABNORMAL HIGH (ref 6–20)
CO2: 31 mmol/L (ref 22–32)
Calcium: 8.2 mg/dL — ABNORMAL LOW (ref 8.9–10.3)
Chloride: 94 mmol/L — ABNORMAL LOW (ref 101–111)
Creatinine, Ser: 2.05 mg/dL — ABNORMAL HIGH (ref 0.61–1.24)
GFR calc Af Amer: 37 mL/min — ABNORMAL LOW (ref >60)
GFR calc non Af Amer: 32 mL/min — ABNORMAL LOW (ref >60)
Glucose, Bld: 116 mg/dL — ABNORMAL HIGH (ref 65–99)
Potassium: 4.4 mmol/L (ref 3.5–5.1)
Sodium: 136 mmol/L (ref 135–145)
Total Bilirubin: 38.4 mg/dL (ref 0.3–1.2)
Total Protein: 5.4 g/dL — ABNORMAL LOW (ref 6.5–8.1)

## 2015-07-27 LAB — GLUCOSE, CAPILLARY
GLUCOSE-CAPILLARY: 105 mg/dL — AB (ref 65–99)
GLUCOSE-CAPILLARY: 106 mg/dL — AB (ref 65–99)
GLUCOSE-CAPILLARY: 108 mg/dL — AB (ref 65–99)
GLUCOSE-CAPILLARY: 109 mg/dL — AB (ref 65–99)
GLUCOSE-CAPILLARY: 109 mg/dL — AB (ref 65–99)
GLUCOSE-CAPILLARY: 110 mg/dL — AB (ref 65–99)
GLUCOSE-CAPILLARY: 111 mg/dL — AB (ref 65–99)
GLUCOSE-CAPILLARY: 111 mg/dL — AB (ref 65–99)
GLUCOSE-CAPILLARY: 111 mg/dL — AB (ref 65–99)
GLUCOSE-CAPILLARY: 114 mg/dL — AB (ref 65–99)
GLUCOSE-CAPILLARY: 115 mg/dL — AB (ref 65–99)
GLUCOSE-CAPILLARY: 116 mg/dL — AB (ref 65–99)
GLUCOSE-CAPILLARY: 117 mg/dL — AB (ref 65–99)
GLUCOSE-CAPILLARY: 117 mg/dL — AB (ref 65–99)
GLUCOSE-CAPILLARY: 119 mg/dL — AB (ref 65–99)
GLUCOSE-CAPILLARY: 130 mg/dL — AB (ref 65–99)
GLUCOSE-CAPILLARY: 133 mg/dL — AB (ref 65–99)
GLUCOSE-CAPILLARY: 133 mg/dL — AB (ref 65–99)
GLUCOSE-CAPILLARY: 98 mg/dL (ref 65–99)
Glucose-Capillary: 102 mg/dL — ABNORMAL HIGH (ref 65–99)
Glucose-Capillary: 96 mg/dL (ref 65–99)
Glucose-Capillary: 142 mg/dL — ABNORMAL HIGH (ref 65–99)
Glucose-Capillary: 118 mg/dL — ABNORMAL HIGH (ref 65–99)
Glucose-Capillary: 115 mg/dL — ABNORMAL HIGH (ref 65–99)
Glucose-Capillary: 110 mg/dL — ABNORMAL HIGH (ref 65–99)
Glucose-Capillary: 95 mg/dL (ref 65–99)
Glucose-Capillary: 107 mg/dL — ABNORMAL HIGH (ref 65–99)
Glucose-Capillary: 118 mg/dL — ABNORMAL HIGH (ref 65–99)
Glucose-Capillary: 115 mg/dL — ABNORMAL HIGH (ref 65–99)
Glucose-Capillary: 114 mg/dL — ABNORMAL HIGH (ref 65–99)
Glucose-Capillary: 119 mg/dL — ABNORMAL HIGH (ref 65–99)
Glucose-Capillary: 105 mg/dL — ABNORMAL HIGH (ref 65–99)
Glucose-Capillary: 108 mg/dL — ABNORMAL HIGH (ref 65–99)
Glucose-Capillary: 108 mg/dL — ABNORMAL HIGH (ref 65–99)
Glucose-Capillary: 106 mg/dL — ABNORMAL HIGH (ref 65–99)

## 2015-07-27 LAB — POCT I-STAT 3, ART BLOOD GAS (G3+)
Acid-Base Excess: 11 mmol/L — ABNORMAL HIGH (ref 0.0–2.0)
BICARBONATE: 34.6 meq/L — AB (ref 20.0–24.0)
O2 Saturation: 98 %
PCO2 ART: 40.9 mmHg (ref 35.0–45.0)
PH ART: 7.535 — AB (ref 7.350–7.450)
TCO2: 36 mmol/L (ref 0–100)
pO2, Arterial: 90 mmHg (ref 80.0–100.0)

## 2015-07-27 LAB — CBC
HCT: 29 % — ABNORMAL LOW (ref 39.0–52.0)
Hemoglobin: 9.6 g/dL — ABNORMAL LOW (ref 13.0–17.0)
MCH: 29.4 pg (ref 26.0–34.0)
MCHC: 33.1 g/dL (ref 30.0–36.0)
MCV: 88.7 fL (ref 78.0–100.0)
Platelets: 104 10*3/uL — ABNORMAL LOW (ref 150–400)
RBC: 3.27 MIL/uL — ABNORMAL LOW (ref 4.22–5.81)
RDW: 22.6 % — ABNORMAL HIGH (ref 11.5–15.5)
WBC: 22.7 10*3/uL — ABNORMAL HIGH (ref 4.0–10.5)

## 2015-07-27 LAB — RENAL FUNCTION PANEL
ALBUMIN: 1.9 g/dL — AB (ref 3.5–5.0)
Anion gap: 9 (ref 5–15)
BUN: 52 mg/dL — AB (ref 6–20)
CO2: 27 mmol/L (ref 22–32)
CREATININE: 2.12 mg/dL — AB (ref 0.61–1.24)
Calcium: 8 mg/dL — ABNORMAL LOW (ref 8.9–10.3)
Chloride: 98 mmol/L — ABNORMAL LOW (ref 101–111)
GFR calc Af Amer: 35 mL/min — ABNORMAL LOW (ref >60)
GFR, EST NON AFRICAN AMERICAN: 31 mL/min — AB (ref >60)
Glucose, Bld: 102 mg/dL — ABNORMAL HIGH (ref 65–99)
PHOSPHORUS: 4.9 mg/dL — AB (ref 2.5–4.6)
Potassium: 4.2 mmol/L (ref 3.5–5.1)
Sodium: 134 mmol/L — ABNORMAL LOW (ref 135–145)

## 2015-07-27 LAB — TYPE AND SCREEN
ABO/RH(D): A POS
ANTIBODY SCREEN: NEGATIVE

## 2015-07-27 LAB — MAGNESIUM: Magnesium: 2 mg/dL (ref 1.7–2.4)

## 2015-07-27 LAB — CALCIUM, IONIZED: Calcium, Ionized, Serum: 4.5 mg/dL (ref 4.5–5.6)

## 2015-07-27 LAB — PHOSPHORUS: Phosphorus: 4.2 mg/dL (ref 2.5–4.6)

## 2015-07-27 LAB — APTT: aPTT: 30 seconds (ref 24–37)

## 2015-07-27 LAB — CARBOXYHEMOGLOBIN
Carboxyhemoglobin: 2.7 % — ABNORMAL HIGH (ref 0.5–1.5)
Methemoglobin: 0.3 % (ref 0.0–1.5)
O2 Saturation: 73.9 %
Total hemoglobin: 9.4 g/dL — ABNORMAL LOW (ref 13.5–18.0)

## 2015-07-27 LAB — PROTIME-INR
INR: 1.32 (ref 0.00–1.49)
Prothrombin Time: 16.5 seconds — ABNORMAL HIGH (ref 11.6–15.2)

## 2015-07-27 MED ORDER — SODIUM CHLORIDE 0.9 % IV SOLN
20.0000 ug | Freq: Once | INTRAVENOUS | Status: AC
  Administered 2015-07-27: 20 ug via INTRAVENOUS
  Filled 2015-07-27 (×2): qty 5

## 2015-07-27 MED ORDER — SODIUM CHLORIDE 0.9 % IV SOLN
Freq: Once | INTRAVENOUS | Status: AC
  Administered 2015-08-10: 11:00:00 via INTRAVENOUS

## 2015-07-27 MED ORDER — ZINC TRACE METAL 1 MG/ML IV SOLN
INTRAVENOUS | Status: AC
  Administered 2015-07-27: 18:00:00 via INTRAVENOUS
  Filled 2015-07-27: qty 1920

## 2015-07-27 MED ORDER — CLINIMIX/DEXTROSE (5/15) 5 % IV SOLN: INTRAVENOUS | Status: DC

## 2015-07-27 MED ORDER — ZINC TRACE METAL 1 MG/ML IV SOLN
INTRAVENOUS | Status: DC
  Filled 2015-07-27: qty 1920

## 2015-07-27 NOTE — Procedures (Signed)
Bedside Tracheostomy Insertion Procedure Note   Patient Details:   Name: CALEN ANGLADE DOB: 10-15-1945 MRN: [id number]  Procedure: Tracheostomy  Pre Procedure Assessment: ET Tube Size:7.5 ET Tube secured at lip (cm):24 Bite block in place: No Breath Sounds: Diminished  Post Procedure Assessment: BP 85/45 mmHg  Pulse 74  Temp(Src) 97.8 F (36.6 C) (Oral)  Resp 27  Ht 5\' 11"  (1.803 m)  Wt 234 lb 9.1 oz (106.4 kg)  BMI 32.73 kg/m2  SpO2 100% O2 sats: stable throughout Complications: No apparent complications Patient did tolerate procedure well Tracheostomy Brand:Shiley Tracheostomy Style:Cuffed Tracheostomy Size: 6.0 CUFFED Tracheostomy Secured [id number], velcro Tracheostomy Placement Confirmation:Trach cuff visualized and in place, cxr taken for placement    Sherrin Stahle Ann 07/27/2015, 3:27 PM

## 2015-07-27 NOTE — Progress Notes (Signed)
PULMONARY / CRITICAL CARE MEDICINE   Name: Joseph [name] MRN: [id number] DOB: 01/07/1946    ADMISSION DATE:  06/07/2015 CONSULTATION DATE:  07/16/2015  REFERRING MD:  Nils Pyle  CHIEF COMPLAINT:  Post cardiothoracic surgery ventilator management  HISTORY OF PRESENT ILLNESS:   70 y/o male with ESRD and significant CAD (CABG 2001, DES 2016, EF 40-45%) was admitted on 5/29 with NSTEMI and had persistent chest pain during his hospitalization so he was taken for a redo CABG on 6/8 (SVG to diag and SVG to OM, IABP placement and CVL placement).  Post operatively he had excessive bleeding from all chest drains (pleural and mediastinal) so he was taken back to the OR on 6/9 for re-exploration.  PCCM was consulted for ventilator management.    SUBJECTIVE:  Remains critically ill, jaundiced. On cvvh. On higher epi drip.  VITAL SIGNS: BP 122/43 mmHg  Pulse 78  Temp(Src) 97.8 F (36.6 C) (Axillary)  Resp 31  Ht 5\' 11"  (1.803 m)  Wt 106.4 kg (234 lb 9.1 oz)  BMI 32.73 kg/m2  SpO2 100%  HEMODYNAMICS: CVP:  [12 mmHg-16 mmHg] 15 mmHg  VENTILATOR SETTINGS: Vent Mode:  [-] PRVC FiO2 (%):  [40 %] 40 % Set Rate:  [16 bmp] 16 bmp Vt Set:  [500 mL] 500 mL PEEP:  [5 cmH20] 5 cmH20 Plateau Pressure:  [15 cmH20-24 cmH20] 23 cmH20  INTAKE / OUTPUT: I/O last 3 completed shifts: In: 7721.8 [I.V.:4331.8; NG/GT:160; IV Piggyback:850] Out: [id number] [Emesis/NG output:750; MY:[id number]; Stool:1; Chest Tube:540]  PHYSICAL EXAMINATION: General:  Critically ill on vent, CVVHD, jaundiced. Icteric.  Neuro:  Sedated. (-) purposeful mm noted.  HEENT:  Diffuse facial edema improved, ETT in place Cardiovascular:  median sternotomy dressing dry, decreased drainage out of mediastinal and pleural tubes. Tachycardic. Variable s1. (-) s3/m/r/g Lungs:  resps even non labored on vent, rales throughout, diminished bibasilar  Abdomen:  Distended, some focal superficial bruising, dec BS Musculoskeletal:  No bony  abnormalitiies Skin:  Bruising all over extensor surfaces and belly, generalized 2++ edema  LABS:  BMET  Recent Labs Lab 07/26/15 0453  07/26/15 1022 07/26/15 1528 07/27/15 0310  NA 138  < > 142 138 136  K 3.7  < > 3.5 3.9 4.4  CL 85*  < > 83* 90* 94*  CO2 39*  --   --  39* 31  BUN 47*  < > 35* 48* 50*  CREATININE 2.16*  < > 1.70* 2.17* 2.05*  GLUCOSE 113*  < > 143* 102* 116*  < > = values in this interval not displayed.  Electrolytes  Recent Labs Lab 07/25/15 0355  07/26/15 0453 07/26/15 1528 07/27/15 0310  CALCIUM 9.3  < > 9.2 8.6* 8.2*  MG 2.3  --  1.9  --  2.0  PHOS 5.2*  < > 3.6 3.4 4.2  < > = values in this interval not displayed.  CBC  Recent Labs Lab 07/25/15 0355  07/26/15 0453  07/26/15 1017 07/26/15 1022 07/27/15 0310  WBC 31.8*  --  26.2*  --   --   --  22.7*  HGB 8.7*  < > 8.6*  < > 13.6 11.9* 9.6*  HCT 25.8*  < > 25.4*  < > 40.0 35.0* 29.0*  PLT 128*  --  108*  --   --   --  104*  < > = values in this interval not displayed.  Coag's  Recent Labs Lab 07/17/2015 1100 07/24/15 0430 07/25/15 0355 07/27/15 0310  APTT  34 38*  --  30  INR 1.86* 2.10* 1.89* 1.32   Sepsis Markers  Recent Labs Lab 07/24/2015 1100 07/24/15 0430 07/25/15 0355  PROCALCITON 11.41 12.60 7.21   ABG  Recent Labs Lab 07/26/15 0405 07/26/15 1010 07/26/15 1525  PHART 7.625* 7.618* 7.610*  PCO2ART 44.1 45.3* 44.5  PO2ART 65.0* 56.0* 76.0*    Liver Enzymes  Recent Labs Lab 07/25/15 0355  07/26/15 0453 07/26/15 1528 07/27/15 0310  AST 104*  --  110*  --  84*  ALT 296*  --  237*  --  187*  ALKPHOS 154*  --  161*  --  195*  BILITOT 36.8*  --  24.2*  --  38.4*  ALBUMIN 2.1*  < > 1.9* 1.9* 1.9*  < > = values in this interval not displayed.  Cardiac Enzymes No results for input(s): TROPONINI, PROBNP in the last 168 hours.  Glucose  Recent Labs Lab 07/27/15 0158 07/27/15 0320 07/27/15 0411 07/27/15 0457 07/27/15 0550 07/27/15 0644  GLUCAP 108*  130* 107* 118* 115* 117*    Imaging Dg Chest Port 1 View  07/27/2015  CLINICAL DATA:  Status post CABG on July 15, 2015 with sternal closure on July 23, 2015, intubated patient EXAM: PORTABLE CHEST 1 VIEW COMPARISON:  Portable chest x-ray of July 26, 2015. FINDINGS: The lungs are borderline hypoinflated. Persistent increased interstitial densities noted throughout the left lung and in the right infrahilar region. Bilateral chest tubes are in stable position. No left-sided pneumothorax is evident. A stable faint pleural line in the right pulmonary apex is noted. There is no pneumothorax nor large pleural effusion. The cardiac silhouette remains enlarged. The pulmonary vascularity is prominent centrally. A mediastinal drain is present and stable. The endotracheal tube tip lies approximately 4.6 cm above the carina. The esophagogastric tube and the feeding tube tips project below the inferior margin of the image. Surgical skin staples are present in the midline to the left and over the right pectoral region. IMPRESSION: Stable appearance of the support tubes. Slight interval improvement in the pulmonary interstitium since yesterday's study may reflect decreasing interstitial edema. Interval resolution of the left pneumothorax. Tiny less than 5% right apical pneumothorax persists. Electronically Signed   By: David  Martinique M.D.   On: 07/27/2015 07:34   STUDIES:  6/14 TTE >EF 30-35%  CULTURES: Sputum 6/10 >> neg  ANTIBIOTICS: 6/8 Vanc >>6/16; 6/18 >>  6/10 Zosyn >>6/16 Primaxin 6/16>>>  SIGNIFICANT EVENTS: 6/8 Redo CABG 6/8 take back to OR emergently for diffuse bleeding 6/16 back to OR to close sternum  LINES/TUBES: Left forearm fistula 6/8 R IJ introducer >> 6/8 Multiple chest tubes (mediastinal and pleural bilateral) >> 6/8 CVC left femoral vein >>out 6/8 R radial arterial line >>6/17>>>6/17>>> 6/8 L IJ [place] >>6/17 L Remy TLC 6/16>>> ETT 6/8>>>  DISCUSSION: 70 y/o male s/p redo CABG  6/8 complicated by significant post operative bleeding in the setting of thrombocytopenia brought back to the Surgical ICU on 6/9 with hypoxemic respiratory failure.  He has baseline ESRD and DM2.  ASSESSMENT / PLAN:  PULMONARY A: Acute hypoxemic respiratory failure 2/2  Pulm edema Post operative pleural bleeding Respiratory acidosis - suspect mostly V/Q mismatch from cardiogenic shock, improved. Now with resp alkalosis related to cvvh.  Small R Pleural Effusion P:   Cont vent support.  Check abg this am. May need to adjust rate and TV again.  Alkalosis 2/2 cvvh citrate. Citrate has been discontinued.  Continue CRRT FiO2 40% post op  and PEEP 5. Intermittent CXR Chest tubes per thoracic surgery, replaced CT per surgery. No weaning due to hemodynamics/sensorium Plan for trache this am c/o Dr. Nelda Marseille. Ordered plt and DDAVP.   CARDIOVASCULAR A:  Severe Cardiogenic and hemorrhagic shock CAD s/p redo CABG IABP d/c'd on 6/18 fPVCs P:  D/C levophed. Epi drip has been increased.  Pacing per TCTS. Tele monitoring. Started amiodarone drip 6/19  RENAL A:   ESRD CKD V P:   Continue CVVHD per renal -- tolerating volume removal on pressors, -150 ml/hr. Renal following Replace electrolytes as indicated.  GASTROINTESTINAL A:   Jaundice and coagulopathy due to shocked liver. P:   OG tube TPN per pharmacy due to high residuals. H2 blocker for stress ulcer prophylaxis Abdominal U/S negative for obstruction, likely shocked liver.  HEMATOLOGIC A:   Diffuse oozing due to coagulopathy from blood loss, thrombocytopenia. Improved.  P:  Transfuse per TCTS guidelines. Trend CBC, coags. Will give plt and ddavp prior to trache.   INFECTIOUS A:   ?HCAP - doubt, respiratory culture negative P:   Empiric abx primaxin Monitor fever curve / WBC. PCT elevated on 6/18 at 7.21  ENDOCRINE A:   DM2   P:   Monitor glucose Insulin gtt per TCTS post op protocol  NEUROLOGIC A:    Sedation needs for vent synchrony P:   RASS goal: -2 Fentanyl gtt  Versed gtt has been dc'd on 6/19/   FAMILY  - Updates:  No family bedside 6/20.  Plan for trache today.  - Inter-disciplinary family meet or Palliative Care meeting due by:  day 7  Critical care time with this patient today: 35 minutes.   Monica Becton, MD 07/27/2015, 8:23 AM [facility] Pager [phone] After 3 pm or if no answer, call [phone]

## 2015-07-27 NOTE — Progress Notes (Signed)
Patient ID: Joseph [name], male   DOB: 1945-07-24, 70 y.o.   MRN: [id number] EVENING ROUNDS NOTE :     [address]       [name] [address]             [phone]                 4 Days Post-Op Procedure(s) (LRB): STERNAL CLOSURE WITH PUMP STANDBY (N/A) TRANSESOPHAGEAL ECHOCARDIOGRAM (TEE) (N/A)  Total Length of Stay:  LOS: 21 days  BP 85/45 mmHg  Pulse 74  Temp(Src) 97.8 F (36.6 C) (Axillary)  Resp 30  Ht 5\' 11"  (1.803 m)  Wt 234 lb 9.1 oz (106.4 kg)  BMI 32.73 kg/m2  SpO2 100%  .Intake/Output      06/19 0701 - 06/20 0700 06/20 0701 - 06/21 0700   I.V. (mL/kg) 2532 (23.8) 1073.2 (10.1)   Blood  197   NG/GT 60    IV Piggyback 650 400   TPN 1540 680   Total Intake(mL/kg) 4782 (44.9) 2350.2 (22.1)   Emesis/NG output 250    Other 6735 3565   Stool 1    Chest Tube 250 60   Total Output 7236 3625   Net -2454 -1274.8        Stool Occurrence 1 x      . sodium chloride 20 mL/hr at 07/27/15 1800  . amiodarone 30 mg/hr (07/27/15 1800)  . EPINEPHrine 4 mg in dextrose 5% 250 mL infusion (16 mcg/mL) 12 mcg/min (07/27/15 1800)  . fentaNYL infusion INTRAVENOUS 100 mcg/hr (07/27/15 1800)  . insulin (NOVOLIN-R) infusion 6.4 Units/hr (07/27/15 1800)  . midazolam (VERSED) infusion Stopped (07/26/15 0852)  . milrinone 0.125 mcg/kg/min (07/27/15 1800)  . norepinephrine (LEVOPHED) Adult infusion Stopped (07/24/15 0630)  . dialysis replacement fluid (prismasate) 300 mL/hr at 07/27/15 0543  . dialysate (PRISMASATE) 1,500 mL/hr at 07/27/15 1550  . TPN [facility]) Adult without lytes 80 mL/hr at 07/27/15 1800     Lab Results  Component Value Date   WBC 22.7* 07/27/2015   HGB 9.6* 07/27/2015   HCT 29.0* 07/27/2015   PLT 104* 07/27/2015   GLUCOSE 102* 07/27/2015   CHOL 88 07/06/2015   TRIG 258* 07/26/2015   HDL 26* 07/06/2015   LDLCALC 25 07/06/2015   ALT 187* 07/27/2015   AST 84* 07/27/2015   NA 134* 07/27/2015   K 4.2 07/27/2015   CL 98* 07/27/2015   CREATININE 2.12* 07/27/2015   BUN 52* 07/27/2015   CO2 27 07/27/2015   TSH 2.026 11/04/2014   PSA <0.1 02/20/2013   INR 1.32 07/27/2015   HGBA1C 6.9* 06/29/2015   Elevated wbc, trach done today T bili 38.4  Grace Isaac MD  Beeper [phone] Office [phone] 07/27/2015 6:31 PM

## 2015-07-27 NOTE — Plan of Care (Signed)
Wasted 50 cc expired fentanyl from drip in sink witnessed by [employer]

## 2015-07-27 NOTE — Plan of Care (Signed)
TNA pharmacist made  aware that tonight's bag of TNA will be the last one.

## 2015-07-27 NOTE — Progress Notes (Signed)
Assessment/ Plan: Pt is a 70 y.o. yo male with ESRD who was admitted on 06/08/2015 with CP and subsequent redo CABG that has been complicated by blood loss req transfusion and hemodynamic instability -s/p balloon pump, VDRF, shock, CRRT Assessment/Plan: 1. CAD- complicated redo CABG- on pressors, milrinone 2. ESRD - normally TTS [facility]- has been off and on CRRT here- resumed CRRT 6/17 via left groin HD cath (placed 6/8)- off citrate protocol due to alkalosis. Has left forearm AVF -  3. Anemia- complicating issue- has required transfusions-  4. HTN/volume- CRRT with volume removal, try 100-200 ml/hr 5. Inc LFTs/hyperbilirubinemia- shock liver  6. Alkalosis, improved off citrate  Subjective: Interval History: For trach today  Objective: Vital signs in last 24 hours: Temp:  [97.4 F (36.3 C)-99 F (37.2 C)] 97.8 F (36.6 C) (06/20 0753) Pulse Rate:  [71-215] 78 (06/20 0815) Resp:  [19-34] 31 (06/20 0815) BP: (122)/(43) 122/43 mmHg (06/19 1200) SpO2:  [91 %-100 %] 100 % (06/20 0815) Arterial Line BP: (89-163)/(37-63) 130/48 mmHg (06/20 0815) FiO2 (%):  [40 %] 40 % (06/20 0400) Weight:  [106.4 kg (234 lb 9.1 oz)] 106.4 kg (234 lb 9.1 oz) (06/20 0200) Weight change: 0.6 kg (1 lb 5.2 oz)  Intake/Output from previous day: 06/19 0701 - 06/20 0700 In: 4782 [I.V.:2532; NG/GT:60; IV Piggyback:650; TPN:1540] Out: 7236 [Emesis/NG output:250; Stool:1; Chest Tube:250] Intake/Output this shift: Total I/O In: 158.9 [I.V.:98.9; TPN:60] Out: 270 [Other:270]  General appearance: Sedated Eyes: icteric Extremities: 2+ edema, post op venctomies Skin: icteric  Lab Results:  Recent Labs  07/26/15 0453  07/26/15 1022 07/27/15 0310  WBC 26.2*  --   --  22.7*  HGB 8.6*  < > 11.9* 9.6*  HCT 25.4*  < > 35.0* 29.0*  PLT 108*  --   --  104*  < > = values in this interval not displayed. BMET:  Recent Labs  07/26/15 1528 07/27/15 0310  NA 138 136  K 3.9 4.4  CL 90* 94*  CO2  39* 31  GLUCOSE 102* 116*  BUN 48* 50*  CREATININE 2.17* 2.05*  CALCIUM 8.6* 8.2*   No results for input(s): PTH in the last 72 hours. Iron Studies: No results for input(s): IRON, TIBC, TRANSFERRIN, FERRITIN in the last 72 hours. Studies/Results: Dg Chest Port 1 View  07/27/2015  CLINICAL DATA:  Status post CABG on July 15, 2015 with sternal closure on July 23, 2015, intubated patient EXAM: PORTABLE CHEST 1 VIEW COMPARISON:  Portable chest x-ray of July 26, 2015. FINDINGS: The lungs are borderline hypoinflated. Persistent increased interstitial densities noted throughout the left lung and in the right infrahilar region. Bilateral chest tubes are in stable position. No left-sided pneumothorax is evident. A stable faint pleural line in the right pulmonary apex is noted. There is no pneumothorax nor large pleural effusion. The cardiac silhouette remains enlarged. The pulmonary vascularity is prominent centrally. A mediastinal drain is present and stable. The endotracheal tube tip lies approximately 4.6 cm above the carina. The esophagogastric tube and the feeding tube tips project below the inferior margin of the image. Surgical skin staples are present in the midline to the left and over the right pectoral region. IMPRESSION: Stable appearance of the support tubes. Slight interval improvement in the pulmonary interstitium since yesterday's study may reflect decreasing interstitial edema. Interval resolution of the left pneumothorax. Tiny less than 5% right apical pneumothorax persists. Electronically Signed   By: David  Martinique M.D.   On: 07/27/2015 07:34  Dg Chest Port 1 View  07/26/2015  CLINICAL DATA:  Intubation. EXAM: PORTABLE CHEST 1 VIEW COMPARISON:  07/25/2015. FINDINGS: Endotracheal tube tip 5 cm above the carina. NG tube tip below left hemidiaphragm. Left IJ line stable position. Bilateral chest tubes in stable position. Prior CABG. Cardiomegaly with pulmonary vascular prominence and bilateral  interstitial prominence consistent congestive heart failure. Bibasilar atelectasis and/or infiltrates/ edema noted. Interim near complete resolution of small left pneumothorax. Surgical staples right upper chest. IMPRESSION: 1. Lines and tubes including bilateral chest tubes in stable position. Interim near complete resolution of small left pneumothorax. 2. Prior CABG. Persistent cardiomegaly with pulmonary venous congestion and bilateral interstitial prominence suggesting congestive heart failure. Bibasilar atelectasis and/or infiltrates/edema noted. These findings have progressed slightly from prior exam. Electronically Signed   By: [place]   On: 07/26/2015 07:30    Scheduled: . sodium chloride   Intravenous Once  . antiseptic oral rinse  7 mL Mouth Rinse 10 times per day  . bisacodyl  10 mg Oral Daily   Or  . bisacodyl  10 mg Rectal Daily  . chlorhexidine gluconate (SAGE KIT)  15 mL Mouth Rinse BID  . darbepoetin (ARANESP) injection - NON-DIALYSIS  100 mcg Subcutaneous Q Fri-1800  . desmopressin (DDAVP) IV  20 mcg Intravenous Once  . docusate sodium  200 mg Oral Daily  . etomidate  40 mg Intravenous Once  . fentaNYL (SUBLIMAZE) injection  200 mcg Intravenous Once  . fluconazole (DIFLUCAN) IV  100 mg Intravenous Q24H  . imipenem-cilastatin  500 mg Intravenous Q8H  . insulin aspart  0-9 Units Subcutaneous Q6H  . insulin glargine  15 Units Subcutaneous BID  . insulin regular  0-10 Units Intravenous TID WC  . levalbuterol  1.25 mg Nebulization Q6H  . metoCLOPramide (REGLAN) injection  10 mg Intravenous Q6H  . midazolam  4 mg Intravenous Once  . pantoprazole (PROTONIX) IV  40 mg Intravenous Q24H  . propofol  50 mL Intravenous Once  . vancomycin  1,000 mg Intravenous Q24H  . vecuronium  10 mg Intravenous Once    LOS: 21 days   Blossie Raffel C 07/27/2015,8:36 AM

## 2015-07-27 NOTE — Procedures (Signed)
Percutaneous Tracheostomy Placement  Consent from family.  Patient sedated, paralyzed and position.  Placed on 100% FiO2 and RR matched.  Area cleaned and draped.  Lidocaine/epi injected.  Skin incision done followed by blunt dissection.  Trachea palpated then punctured, catheter passed and visualized bronchoscopically.  Wire placed and visualized.  Catheter removed.  Airway then entered and dilated.  Size 6 cuffed shiley trach placed and visualized bronchoscopically well above carina.  Good volume returns.  Patient tolerated the procedure well without complications.  Minimal blood loss.  CXR ordered and pending.  Wesam G. Yacoub, M.D. [place] [facility]. Pager: [phone]. After hours pager: [phone].  

## 2015-07-27 NOTE — Progress Notes (Signed)
4 Days Post-Op Procedure(s) (LRB): STERNAL CLOSURE WITH PUMP STANDBY (N/A) TRANSESOPHAGEAL ECHOCARDIOGRAM (TEE) (N/A) Subjective: Patient remains hemodynamically stable on medium dose epinephrine Percutaneous tracheostomy performed today Chest x-ray with improved edema taken after tracheostomy placement Patient continues sinus rhythm on low-dose amiodarone Jaundice continues to worsen-will transition from TPN to postpyloric tube feeding with Nepro Chest tubes drained  250 cc overnight-we'll leave in place Objective: Vital signs in last 24 hours: Temp:  [97.4 F (36.3 C)-98.1 F (36.7 C)] 98.1 F (36.7 C) (06/20 1941) Pulse Rate:  [70-215] 80 (06/20 1915) Cardiac Rhythm:  [-] Normal sinus rhythm (06/20 0724) Resp:  [13-37] 35 (06/20 1915) BP: (85-181)/(44-61) 85/45 mmHg (06/20 1202) SpO2:  [96 %-100 %] 100 % (06/20 1929) Arterial Line BP: (87-196)/(38-75) 121/48 mmHg (06/20 1915) FiO2 (%):  [40 %] 40 % (06/20 1929) Weight:  [234 lb 9.1 oz (106.4 kg)] 234 lb 9.1 oz (106.4 kg) (06/20 0200)  Hemodynamic parameters for last 24 hours: CVP:  [13 mmHg-17 mmHg] 17 mmHg  Intake/Output from previous day: 06/19 0701 - 06/20 0700 In: 4782 [I.V.:2532; NG/GT:60; IV Piggyback:650; TPN:1540] Out: 7236 [Emesis/NG output:250; Stool:1; Chest Tube:250] Intake/Output this shift:    Jaundiced More alert but nonpurposeful Extremities warm Abdomen soft Minimal bleeding from trach site  Lab Results:  Recent Labs  07/26/15 0453  07/26/15 1022 07/27/15 0310  WBC 26.2*  --   --  22.7*  HGB 8.6*  < > 11.9* 9.6*  HCT 25.4*  < > 35.0* 29.0*  PLT 108*  --   --  104*  < > = values in this interval not displayed. BMET:  Recent Labs  07/27/15 0310 07/27/15 1545  NA 136 134*  K 4.4 4.2  CL 94* 98*  CO2 31 27  GLUCOSE 116* 102*  BUN 50* 52*  CREATININE 2.05* 2.12*  CALCIUM 8.2* 8.0*    PT/INR:  Recent Labs  07/27/15 0310  LABPROT 16.5*  INR 1.32   ABG    Component Value  Date/Time   PHART 7.535* 07/27/2015 0849   HCO3 34.6* 07/27/2015 0849   TCO2 36 07/27/2015 0849   ACIDBASEDEF 0.6 07/21/2015 0415   O2SAT 98.0 07/27/2015 0849   CBG (last 3)   Recent Labs  07/27/15 1643 07/27/15 1746 07/27/15 1824  GLUCAP 108* 119* 133*    Assessment/Plan: S/P Procedure(s) (LRB): STERNAL CLOSURE WITH PUMP STANDBY (N/A) TRANSESOPHAGEAL ECHOCARDIOGRAM (TEE) (N/A) Place postpyloric feeding tube by IR and resume attempts at tube feeds with Nepro Continue broad-spectrum antibiotics for leukocytosis, fever 4 days ago and possible pneumonia Continue CRT while on epinephrine   LOS: 21 days    Tharon Aquas Trigt III 07/27/2015

## 2015-07-27 NOTE — Plan of Care (Signed)
Gave fentanyl 200 mcg bolus from bag for trach procedure per Dr. Nelda Marseille at 1000.

## 2015-07-27 NOTE — Plan of Care (Signed)
Wasted 45 cc versed from expired drip in sink witnessed by [employer]

## 2015-07-27 NOTE — Progress Notes (Signed)
[place] NOTE  Pharmacy Consult for TPN Indication: increased residuals  Allergies  Allergen Reactions  . Sulfa Antibiotics Hives and Itching  . Morphine And Related     Makes patient feel weird   Patient Measurements: Height: _0  (180.3 cm) Weight: 234 lb 9.1 oz (106.4 kg) IBW/kg (Calculated) : 75.3  Vital Signs: Temp: 97.8 F (36.6 C) (06/20 0753) Temp Source: Axillary (06/20 0753) Pulse Rate: 78 (06/20 0815) Intake/Output from previous day: 06/19 0701 - 06/20 0700 In: 4782 [I.V.:2532; NG/GT:60; IV Piggyback:650; TPN:1540] Out: 7236 [Emesis/NG output:250; Stool:1; Chest Tube:250] Intake/Output from this shift: Total I/O In: 158.9 [I.V.:98.9; TPN:60] Out: 270 [Other:270]  Labs:  Recent Labs  07/25/15 0355  07/26/15 0453  07/26/15 1017 07/26/15 1022 07/27/15 0310  WBC 31.8*  --  26.2*  --   --   --  22.7*  HGB 8.7*  < > 8.6*  < > 13.6 11.9* 9.6*  HCT 25.8*  < > 25.4*  < > 40.0 35.0* 29.0*  PLT 128*  --  108*  --   --   --  104*  APTT  --   --   --   --   --   --  30  INR 1.89*  --   --   --   --   --  1.32  < > = values in this interval not displayed.   Recent Labs  07/25/15 0355  07/26/15 0453  07/26/15 1022 07/26/15 1528 07/27/15 0310  NA 138  < > 138  < > 142 138 136  K 3.7  < > 3.7  < > 3.5 3.9 4.4  CL 89*  < > 85*  < > 83* 90* 94*  CO2 31  < > 39*  --   --  39* 31  GLUCOSE 140*  < > 113*  < > 143* 102* 116*  BUN 66*  < > 47*  < > 35* 48* 50*  CREATININE 3.24*  < > 2.16*  < > 1.70* 2.17* 2.05*  CALCIUM 9.3  < > 9.2  --   --  8.6* 8.2*  MG 2.3  --  1.9  --   --   --  2.0  PHOS 5.2*  < > 3.6  --   --  3.4 4.2  PROT 5.7*  --  5.2*  --   --   --  5.4*  ALBUMIN 2.1*  < > 1.9*  --   --  1.9* 1.9*  AST 104*  --  110*  --   --   --  84*  ALT 296*  --  237*  --   --   --  187*  ALKPHOS 154*  --  161*  --   --   --  195*  BILITOT 36.8*  --  24.2*  --   --   --  38.4*  PREALBUMIN 15.0*  --  19.2  --   --   --   --   TRIG 188*  --   258*  --   --   --   --   < > = values in this interval not displayed. Estimated Creatinine Clearance: 43.4 mL/min (by C-G formula based on Cr of 2.05).    Recent Labs  07/27/15 0550 07/27/15 0644 07/27/15 0735  GLUCAP 115* 117* 115*    Medical History: Past Medical History  Diagnosis Date  . Hypertensive heart disease   . Hypercholesterolemia   .  Prostate cancer ([place])   . Hepatosplenomegaly 2015    fibrosis, no cirrhosis on 06/2013 liver biopsy  . Thrombocytopenia ([place]) 2015  . Depression with anxiety   . IDDM (insulin dependent diabetes mellitus) ([place])     INSULIN DEPENDENT  . GERD (gastroesophageal reflux disease)   . History of shingles   . Ischemic cardiomyopathy     a. echo 08/06/2014: EF 25-30%, multiple WMA, mod DD, PASP 49 mm Hg;  b. 12/2014 Echo: EF 30-35%, diff HK, mild MR.; Echo 03/2015 - ? LV thrombus (not seen in 3/27 - but EF ? 40-45%.   . CKD (chronic kidney disease), stage IV ([place])     a. previously on dialysis 11/2013->04/2014;  b. 12/2014 Dialysis resumed.  . Coronary artery disease, occlusive     a. 2001 MI s/p 4v CABG (LIMA-LAD, SVG-D2, SVG-OM1, SVG-right PDA, SVG-right PL1); b. cath 07/30/2014 vein grafts all down, patent LIMA to LAD, significant LM and ost LCx dx; c. 08/07/2014 PCI Synergy DES 2.5 mm x 18 mm to dist LM and ost LCx, rec lifelong DAPT.; 02/2015 - Patent LIMA-LAD with L-R Collaterals, 60% ISR of LM-Cx stent (non-occlusive)  . Chronic combined systolic and diastolic CHF, NYHA class 2 ([place])     a. 08/2013 Echo: EF 40-45%, impaired relaxation;  b. 07/2014 Echo: EF 25-30%;  c. 12/2014 Echo: EF 30-35%, diff HK, mild MR.  . Gallstone pancreatitis 2015  . ESRD (end stage renal disease) ([place])    Admit:  70yo male with significant PMH of CAD presents with STEMI with continued CP. TPN for increased residuals  Insulin Requirements in the past 24 hours:  Ins drip + Lantus 15 units BID per CVTS 0 units SSI-sensitive q6h in 24h  Surgeries/Procedures: 6/8  Redo sternotomy, CABG - IABP - now out  GI: Jaundice - shock liver -- Albumin 1.9, AST/ALT elevated but trending down, alk phos up 195. TG 188>258. Tbili 24.2>38.4. LBM 6/20. NGT with 6168m/output - Pepcid, reglan, PPI IV  Endo: CBG's 102-143 (trending low) on ins drip (transitioning to lantus per CVTS)+SSI  Lytes: Electrolytes WNL - Ca Gluc infusion with CRRT d/c'd. CVTS ordered 2 k runs 6/19 > K 4.4  Renal: ESRD - Anuric - CVVHD ongoing - will use Clinimix without electrolytes  Pulm: Intubated postop due to acute hypoxemic failure - CXR = now with small pneumothroax, atelectasis decreasing. For trach 6/20 -Xopenex  Cards: Hx. CAD - CABG 2001, CHF/PCI, Dyslipidemia - NSR - Remains on Epinephrine at 446m/min - Milrinone at 0.12534mkg/min. Amio gtt started 6/19  Heme: Hypercoagulable. Darbo every Friday - surgery transfusions - RBC #18, Cryo - 6units, Plasma -8 units, platelets 11 units. Has continued to require ongoing support for oozing and thrombocytopenia issues - Hg down 9.6, Platelets low stable 104  Neuro: Intubated and sedated with Fent/Versed (weaning)  ID: ?HCAP - empiric antibiotics.  WBC - wbc down 22.7. PCT 12.6>>7.21, low temps (CRRT). Cultures neg to date 6/8 Vanc>>6/16; 6/1 >>  6/10 Zosyn>>6/16 Primaxin 6/16>>> Fluconazole 6/19>>  Best Practices: SCDs, MC, PPI/H2B  TPN Access: CVC 6/16 TPN start date: 6/17>>  Current Nutrition:  NPO Clinimix 5/15 NO LYTES at 49m61m- provides 1022 kcal + 72g protein  Fluids: NS at 20 ml/h  Nutritional Goals:  1315-1673 Kcal and 150-160gm protein - RD assessment  Plan:  -Increase Clinimix 5/15 NO LYTES (CRRT) to 80ml21mWill remove lipids for now w/ pt in the ICU D#2/7. Titrate to goal rate as tolerated. -Provides 96g protein + 1363kcal. Will  be unable to meet protein needs due to limitations of Clinimix -Add MVI to TPN bag -Remove trace elements; add back Zn 29m/d + chromium 18m/d + selenium 6051md -Continue  SSI-sensitive q6h and monitor CBGs on ins drip+lantus -Add Pepcid 32m30m TPN bag -F/u AM labs   HaleElicia LamparmD, BCPSThe Orthopedic Surgical Center Of Montananical Pharmacist Pager [phone] 8:26 AM

## 2015-07-27 NOTE — Procedures (Signed)
Bronchoscopy Procedure Note Joseph [name] [id number] [birth year]  Procedure: Bronchoscopy Indications: Tracheostomy Placement  Procedure Details Consent: Risks of procedure as well as the alternatives and risks of each were explained to the (patient/caregiver).  Consent for procedure obtained. Time Out: Verified patient identification, verified procedure, site/side was marked, verified correct patient position, special equipment/implants available, medications/allergies/relevent history reviewed, required imaging and test results available.  Performed  In preparation for procedure, patient was given 100% FiO2 and bronchoscope lubricated. Sedation: Benzodiazepines, Muscle relaxants, Etomidate and Fentanyl  Airway entered and the following bronchi were examined:  Upper airway examined for tracheostomy placement. Procedures performed: Brushings performed Bronchoscope removed.    Evaluation Hemodynamic Status: BP stable throughout; O2 sats: stable throughout Patient's Current Condition: stable Specimens:  None Complications: No apparent complications Patient tolerated procedure well.   Procedure performed under direct supervision of Dr. Nelda Marseille.   Joseph Gens, NP-C [facility] Pgr: [phone] or if no answer [phone] 07/27/2015, 10:40 AM  Rush Farmer, M.D. [facility]. Pager: [phone]. After hours pager: [phone].

## 2015-07-28 ENCOUNTER — Inpatient Hospital Stay (HOSPITAL_COMMUNITY): Payer: Medicare Other

## 2015-07-28 DIAGNOSIS — N179 Acute kidney failure, unspecified: Secondary | ICD-10-CM | POA: Insufficient documentation

## 2015-07-28 LAB — BLOOD GAS, ARTERIAL
ACID-BASE EXCESS: 0.5 mmol/L (ref 0.0–2.0)
Bicarbonate: 24.7 mEq/L — ABNORMAL HIGH (ref 20.0–24.0)
FIO2: 0.21
O2 SAT: 96.1 %
Patient temperature: 98.6
TCO2: 25.9 mmol/L (ref 0–100)
pCO2 arterial: 40.4 mmHg (ref 35.0–45.0)
pH, Arterial: 7.403 (ref 7.350–7.450)
pO2, Arterial: 86.7 mmHg (ref 80.0–100.0)

## 2015-07-28 LAB — RENAL FUNCTION PANEL
ALBUMIN: 2.1 g/dL — AB (ref 3.5–5.0)
ANION GAP: 10 (ref 5–15)
BUN: 54 mg/dL — AB (ref 6–20)
CALCIUM: 7.9 mg/dL — AB (ref 8.9–10.3)
CO2: 23 mmol/L (ref 22–32)
Chloride: 99 mmol/L — ABNORMAL LOW (ref 101–111)
Creatinine, Ser: 2.17 mg/dL — ABNORMAL HIGH (ref 0.61–1.24)
GFR calc Af Amer: 34 mL/min — ABNORMAL LOW (ref >60)
GFR calc non Af Amer: 30 mL/min — ABNORMAL LOW (ref >60)
GLUCOSE: 159 mg/dL — AB (ref 65–99)
PHOSPHORUS: 5.4 mg/dL — AB (ref 2.5–4.6)
Potassium: 4.5 mmol/L (ref 3.5–5.1)
SODIUM: 132 mmol/L — AB (ref 135–145)

## 2015-07-28 LAB — GLUCOSE, CAPILLARY
GLUCOSE-CAPILLARY: 103 mg/dL — AB (ref 65–99)
GLUCOSE-CAPILLARY: 114 mg/dL — AB (ref 65–99)
GLUCOSE-CAPILLARY: 120 mg/dL — AB (ref 65–99)
GLUCOSE-CAPILLARY: 131 mg/dL — AB (ref 65–99)
GLUCOSE-CAPILLARY: 142 mg/dL — AB (ref 65–99)
GLUCOSE-CAPILLARY: 165 mg/dL — AB (ref 65–99)
GLUCOSE-CAPILLARY: 171 mg/dL — AB (ref 65–99)
Glucose-Capillary: 101 mg/dL — ABNORMAL HIGH (ref 65–99)
Glucose-Capillary: 105 mg/dL — ABNORMAL HIGH (ref 65–99)
Glucose-Capillary: 105 mg/dL — ABNORMAL HIGH (ref 65–99)
Glucose-Capillary: 106 mg/dL — ABNORMAL HIGH (ref 65–99)
Glucose-Capillary: 109 mg/dL — ABNORMAL HIGH (ref 65–99)
Glucose-Capillary: 111 mg/dL — ABNORMAL HIGH (ref 65–99)
Glucose-Capillary: 113 mg/dL — ABNORMAL HIGH (ref 65–99)
Glucose-Capillary: 114 mg/dL — ABNORMAL HIGH (ref 65–99)
Glucose-Capillary: 134 mg/dL — ABNORMAL HIGH (ref 65–99)
Glucose-Capillary: 158 mg/dL — ABNORMAL HIGH (ref 65–99)
Glucose-Capillary: 162 mg/dL — ABNORMAL HIGH (ref 65–99)

## 2015-07-28 LAB — CARBOXYHEMOGLOBIN
Carboxyhemoglobin: 1.7 % — ABNORMAL HIGH (ref 0.5–1.5)
Carboxyhemoglobin: 2.3 % — ABNORMAL HIGH (ref 0.5–1.5)
Methemoglobin: 0.6 % (ref 0.0–1.5)
Methemoglobin: 0.2 % (ref 0.0–1.5)
O2 Saturation: 59.9 %
O2 Saturation: 73.4 %
Total hemoglobin: 16.5 g/dL (ref 13.5–18.0)
Total hemoglobin: 11 g/dL — ABNORMAL LOW (ref 13.5–18.0)

## 2015-07-28 LAB — POCT I-STAT 3, ART BLOOD GAS (G3+)
ACID-BASE EXCESS: 3 mmol/L — AB (ref 0.0–2.0)
Bicarbonate: 27 mEq/L — ABNORMAL HIGH (ref 20.0–24.0)
O2 SAT: 97 %
TCO2: 28 mmol/L (ref 0–100)
pCO2 arterial: 38.2 mmHg (ref 35.0–45.0)
pH, Arterial: 7.457 — ABNORMAL HIGH (ref 7.350–7.450)
pO2, Arterial: 81 mmHg (ref 80.0–100.0)

## 2015-07-28 LAB — PREPARE PLATELET PHERESIS: Unit division: 0

## 2015-07-28 LAB — CULTURE, BLOOD (SINGLE): Culture: NO GROWTH

## 2015-07-28 LAB — COMPREHENSIVE METABOLIC PANEL
ALT: 116 U/L — ABNORMAL HIGH (ref 17–63)
AST: 80 U/L — ABNORMAL HIGH (ref 15–41)
Albumin: 1.8 g/dL — ABNORMAL LOW (ref 3.5–5.0)
Alkaline Phosphatase: 196 U/L — ABNORMAL HIGH (ref 38–126)
Anion gap: 9 (ref 5–15)
BUN: 51 mg/dL — ABNORMAL HIGH (ref 6–20)
CO2: 25 mmol/L (ref 22–32)
Calcium: 7.7 mg/dL — ABNORMAL LOW (ref 8.9–10.3)
Chloride: 100 mmol/L — ABNORMAL LOW (ref 101–111)
Creatinine, Ser: 2.08 mg/dL — ABNORMAL HIGH (ref 0.61–1.24)
GFR calc Af Amer: 36 mL/min — ABNORMAL LOW (ref >60)
GFR calc non Af Amer: 31 mL/min — ABNORMAL LOW (ref >60)
Glucose, Bld: 99 mg/dL (ref 65–99)
Potassium: 4.6 mmol/L (ref 3.5–5.1)
Sodium: 134 mmol/L — ABNORMAL LOW (ref 135–145)
Total Bilirubin: 37.7 mg/dL (ref 0.3–1.2)
Total Protein: 5.2 g/dL — ABNORMAL LOW (ref 6.5–8.1)

## 2015-07-28 LAB — CBC
HCT: 26.2 % — ABNORMAL LOW (ref 39.0–52.0)
Hemoglobin: 8.7 g/dL — ABNORMAL LOW (ref 13.0–17.0)
MCH: 28.6 pg (ref 26.0–34.0)
MCHC: 33.2 g/dL (ref 30.0–36.0)
MCV: 86.2 fL (ref 78.0–100.0)
Platelets: 119 10*3/uL — ABNORMAL LOW (ref 150–400)
RBC: 3.04 MIL/uL — ABNORMAL LOW (ref 4.22–5.81)
RDW: 23.7 % — ABNORMAL HIGH (ref 11.5–15.5)
WBC: 21.2 10*3/uL — ABNORMAL HIGH (ref 4.0–10.5)

## 2015-07-28 LAB — PHOSPHORUS: Phosphorus: 5.3 mg/dL — ABNORMAL HIGH (ref 2.5–4.6)

## 2015-07-28 LAB — MAGNESIUM: Magnesium: 2.1 mg/dL (ref 1.7–2.4)

## 2015-07-28 LAB — VANCOMYCIN, TROUGH: Vancomycin Tr: 22 ug/mL — ABNORMAL HIGH (ref 10.0–20.0)

## 2015-07-28 LAB — CALCIUM, IONIZED: Calcium, Ionized, Serum: 4.7 mg/dL (ref 4.5–5.6)

## 2015-07-28 MED ORDER — DIATRIZOATE MEGLUMINE & SODIUM 66-10 % PO SOLN
ORAL | Status: AC
  Administered 2015-07-28: 15 mL via ORAL
  Filled 2015-07-28: qty 30

## 2015-07-28 MED ORDER — NEPRO/CARBSTEADY PO LIQD
1000.0000 mL | ORAL | Status: DC
  Administered 2015-07-28: 1000 mL
  Filled 2015-07-28 (×3): qty 1000

## 2015-07-28 MED ORDER — NEPRO/CARBSTEADY PO LIQD
237.0000 mL | Freq: Three times a day (TID) | ORAL | Status: DC
  Filled 2015-07-28 (×4): qty 237

## 2015-07-28 MED ORDER — DIATRIZOATE MEGLUMINE & SODIUM 66-10 % PO SOLN
30.0000 mL | Freq: Once | ORAL | Status: AC
  Administered 2015-07-28: 15 mL via ORAL
  Filled 2015-07-28: qty 30

## 2015-07-28 MED ORDER — LIDOCAINE VISCOUS 2 % MT SOLN
15.0000 mL | Freq: Once | OROMUCOSAL | Status: AC
  Administered 2015-07-28: 6 mL via OROMUCOSAL

## 2015-07-28 MED ORDER — LIDOCAINE VISCOUS 2 % MT SOLN
OROMUCOSAL | Status: AC
  Administered 2015-07-28: 6 mL via OROMUCOSAL
  Filled 2015-07-28: qty 15

## 2015-07-28 MED ORDER — INSULIN GLARGINE 100 UNIT/ML ~~LOC~~ SOLN
20.0000 [IU] | Freq: Two times a day (BID) | SUBCUTANEOUS | Status: DC
  Administered 2015-07-28 – 2015-07-29 (×3): 20 [IU] via SUBCUTANEOUS
  Filled 2015-07-28 (×4): qty 0.2

## 2015-07-28 MED ORDER — ALBUMIN HUMAN 25 % IV SOLN
12.5000 g | Freq: Four times a day (QID) | INTRAVENOUS | Status: AC
  Administered 2015-07-28 (×2): 12.5 g via INTRAVENOUS
  Filled 2015-07-28 (×2): qty 50

## 2015-07-28 NOTE — Progress Notes (Signed)
TCTS BRIEF SICU PROGRESS NOTE  5 Days Post-Op  S/P Procedure(s) (LRB): STERNAL CLOSURE WITH PUMP STANDBY (N/A) TRANSESOPHAGEAL ECHOCARDIOGRAM (TEE) (N/A)   Post-pyloric feeding tube in place BP somewhat labile, increases w/ stimulation but still requiring high dose pressors O2 sats 100% on 40% FiO2  Plan: Continue current plan.  Starting tube feeds.  Rexene Alberts, MD 07/28/2015 6:51 PM

## 2015-07-28 NOTE — Progress Notes (Addendum)
SBP steadily dropping into the 60s, Epi increased from 12 to 15, Levo added. Attempted pacing, pt did not tolerate- frequent ectopy, remaining hypotensive. Keeping positive CRRT at this time. SBP > 90 map 50-55. MD Sood notified. Will cont to monitor, titrate gtts and update MD as necessary. Rebekkah Powless L

## 2015-07-28 NOTE — Progress Notes (Addendum)
[place] NOTE  Pharmacy Consult for TPN Indication: increased residuals  Allergies  Allergen Reactions  . Sulfa Antibiotics Hives and Itching  . Morphine And Related     Makes patient feel weird   Patient Measurements: Height: 5' 11" (180.3 cm) Weight: 233 lb 4 oz (105.8 kg) IBW/kg (Calculated) : 75.3  Vital Signs: Temp: 98.2 F (36.8 C) (06/21 0840) Temp Source: Axillary (06/21 0840) BP: 141/57 mmHg (06/21 0723) Pulse Rate: 77 (06/21 0723) Intake/Output from previous day: 06/20 0701 - 06/21 0700 In: 4929.8 [I.V.:2502.8; Blood:197; IV Piggyback:510; TPN:1720] Out: 7264 [Chest Tube:400] Intake/Output from this shift: Total I/O In: 180.3 [I.V.:100.3; TPN:80] Out: 349 [Other:299; Chest Tube:50]  Labs:  Recent Labs  07/26/15 0453  07/26/15 1017 07/26/15 1022 07/27/15 0310  WBC 26.2*  --   --   --  22.7*  HGB 8.6*  < > 13.6 11.9* 9.6*  HCT 25.4*  < > 40.0 35.0* 29.0*  PLT 108*  --   --   --  104*  APTT  --   --   --   --  30  INR  --   --   --   --  1.32  < > = values in this interval not displayed.   Recent Labs  07/26/15 0453  07/27/15 0310 07/27/15 1545 07/28/15 0255  NA 138  < > 136 134* 134*  K 3.7  < > 4.4 4.2 4.6  CL 85*  < > 94* 98* 100*  CO2 39*  < > _0 GLUCOSE 113*  < > 116* 102* 99  BUN 47*  < > 50* 52* 51*  CREATININE 2.16*  < > 2.05* 2.12* 2.08*  CALCIUM 9.2  < > 8.2* 8.0* 7.7*  MG 1.9  --  2.0  --  2.1  PHOS 3.6  < > 4.2 4.9* 5.3*  PROT 5.2*  --  5.4*  --  5.2*  ALBUMIN 1.9*  < > 1.9* 1.9* 1.8*  AST 110*  --  84*  --  80*  ALT 237*  --  187*  --  116*  ALKPHOS 161*  --  195*  --  196*  BILITOT 24.2*  --  38.4*  --  37.7*  PREALBUMIN 19.2  --   --   --   --   TRIG 258*  --   --   --   --   < > = values in this interval not displayed. Estimated Creatinine Clearance: 42.7 mL/min (by C-G formula based on Cr of 2.08).    Recent Labs  07/27/15 2151 07/27/15 2253 07/28/15 0007  GLUCAP 106* 109* 105*     Medical History: Past Medical History  Diagnosis Date  . Hypertensive heart disease   . Hypercholesterolemia   . Prostate cancer ([place])   . Hepatosplenomegaly 2015    fibrosis, no cirrhosis on 06/2013 liver biopsy  . Thrombocytopenia ([place]) 2015  . Depression with anxiety   . IDDM (insulin dependent diabetes mellitus) ([place])     INSULIN DEPENDENT  . GERD (gastroesophageal reflux disease)   . History of shingles   . Ischemic cardiomyopathy     a. echo 08/06/2014: EF 25-30%, multiple WMA, mod DD, PASP 49 mm Hg;  b. 12/2014 Echo: EF 30-35%, diff HK, mild MR.; Echo 03/2015 - ? LV thrombus (not seen in 3/27 - but EF ? 40-45%.   . CKD (chronic kidney disease), stage IV ([place])     a. previously on dialysis  11/2013->04/2014;  b. 12/2014 Dialysis resumed.  . Coronary artery disease, occlusive     a. 2001 MI s/p 4v CABG (LIMA-LAD, SVG-D2, SVG-OM1, SVG-right PDA, SVG-right PL1); b. cath 07/30/2014 vein grafts all down, patent LIMA to LAD, significant LM and ost LCx dx; c. 08/07/2014 PCI Synergy DES 2.5 mm x 18 mm to dist LM and ost LCx, rec lifelong DAPT.; 02/2015 - Patent LIMA-LAD with L-R Collaterals, 60% ISR of LM-Cx stent (non-occlusive)  . Chronic combined systolic and diastolic CHF, NYHA class 2 ([facility])     a. 08/2013 Echo: EF 40-45%, impaired relaxation;  b. 07/2014 Echo: EF 25-30%;  c. 12/2014 Echo: EF 30-35%, diff HK, mild MR.  . Gallstone pancreatitis 2015  . ESRD (end stage renal disease) ([facility])    Admit:  70yo male with significant PMH of CAD presents with STEMI with continued CP. TPN for increased residuals  Insulin Requirements in the past 24 hours:  Ins drip + Lantus 20 units BID per CVTS 0 units SSI-sensitive q6h in 24h  Surgeries/Procedures: 6/8 Redo sternotomy, CABG - IABP - now out  GI: Jaundice - shock liver -- Albumin 1.9, AST/ALT elevated but trending down, alk phos 196. TG 188>258. Tbili 37.7. LBM 6/20. NGT with 6957ml/output. Post-pyloric tube placed - Pepcid, reglan, PPI  IV  Endo: CBG's 99-133 on ins drip+lantus+SSI  Lytes: Na 134, phos 5.3, Mag 2.1, coCa~9.5  Renal: ESRD - Anuric - CVVHD ongoing - will use Clinimix without electrolytes  Pulm: Intubated postop due to acute hypoxemic failure - CXR = now with small pneumothroax, atelectasis decreasing. S/p trach 6/20 -Xopenex  Cards: Hx. CAD - CABG 2001, CHF/PCI, Dyslipidemia - NSR - Remains on Epinephrine at 45mcg/min - Milrinone at 0.125mcg/kg/min. Amio gtt started 6/19  Heme: Hypercoagulable. Darbo every Friday - surgery transfusions - RBC #18, Cryo - 6units, Plasma -8 units, platelets 11 units. Has continued to require ongoing support for oozing and thrombocytopenia issues - Hg down 9.6, Platelets low stable 104  Neuro: Intubated and sedated with Fent/Versed (weaning)  ID: ?HCAP - empiric antibiotics.  WBC - wbc down 22.7. PCT 12.6>>7.21, low temps (CRRT). Cultures neg to date 6/8 Vanc>>6/16; 6/1 >>  6/10 Zosyn>>6/16 Primaxin 6/16>>> Fluconazole 6/19>>  Best Practices: SCDs, MC, PPI/H2B  TPN Access: CVC 6/16 TPN start date: 6/17>>  Current Nutrition:  NPO - Nephro Carb Steady ordered, not yet given Clinimix 5/15 NO LYTES at 80ml/h - provides 1363 kcal + 96g protein  Fluids: NS at 20 ml/h  Nutritional Goals:  1315-1673 Kcal and 150-160gm protein - RD assessment  Plan: -Per Dr. Van Trigt, current bag of TPN will be the last. Pt to transition off TPN today.  -Continue Clinimix 5/15 NO LYTES (CRRT) at 80ml/h until 1700, then turn down rate to 40ml/h until bag runs out tonight with TF starting. No lipids for now w/ pt in the ICU D#3/7.  -Add MVI to TPN bag -Remove trace elements; add back Zn 5mg/d + chromium 10mcg/d + selenium 60mcg/d -Continue SSI-sensitive q6h and monitor CBGs on ins drip+lantus -Add Pepcid 20mg to TPN bag -Monitor labs    [place], PharmD, BCPS Clinical Pharmacist Pager [phone] 07/28/2015 9:31 AM   

## 2015-07-28 NOTE — Anesthesia Postprocedure Evaluation (Signed)
Anesthesia Post Note  Patient: Joseph [name]  Procedure(s) Performed: Procedure(s) (LRB): REDO CORONARY ARTERY BYPASS GRAFTING (CABG) times two using right greater saphenous vein harvested by endovein, cryovein saphenous vein implant  (N/A) TRANSESOPHAGEAL ECHOCARDIOGRAM (TEE) (N/A)  Patient location during evaluation: SICU Anesthesia Type: General Level of consciousness: patient remains intubated per anesthesia plan Pain management: pain level controlled Vital Signs Assessment: post-procedure vital signs reviewed and stable Respiratory status: patient remains intubated per anesthesia plan Cardiovascular status: stable Anesthetic complications: no    Last Vitals:  Filed Vitals:   07/28/15 1835 07/28/15 1845  BP:    Pulse: 74 74  Temp:    Resp: 23 24    Last Pain:  Filed Vitals:   07/28/15 1846  PainSc: 0-No pain                 EDWARDS,Neil Brickell

## 2015-07-28 NOTE — Progress Notes (Signed)
70 y.o. male with ESRD who was admitted 07/06/2015 with CP and subsequent redo CABG complicated by blood loss req transfusion and hemodynamic instability -s/p balloon pump, VDRF, shock, CRRT Assessment/Plan: 1. CAD- complicated redo CABG- on pressors, milrinone 2. ESRD - normally [place]-  off and on CRRT here- resumed CRRT 6/17 via left groin HD cath (placed 6/8)- off citrate protocol due to alkalosis. Has left forearm AVF -  3. Anemia- has required transfusions-  4. HTN/volume- CRRT with volume removal 5. Inc LFTs/hyperbilirubinemia- shock liver  6. Alkalosis, improved off citrate  Subjective: Interval History: Remains on CRRT  Objective: Vital signs in last 24 hours: Temp:  [97.8 F (36.6 [name])-98.7 F (37.1 [name])] 98.2 F (36.8 [name]) (06/21 0840) Pulse Rate:  [31-84] 71 (06/21 1015) Resp:  [24-41] 30 (06/21 1015) BP: (85-181)/(45-61) 141/57 mmHg (06/21 0723) SpO2:  [100 %] 100 % (06/21 1015) Arterial Line BP: (87-197)/(38-75) 100/42 mmHg (06/21 1015) FiO2 (%):  [40 %] 40 % (06/21 0800) Weight:  [105.8 kg (233 lb 4 oz)] 105.8 kg (233 lb 4 oz) (06/21 0200) Weight change: -0.6 kg (-1 lb 5.2 oz)  Intake/Output from previous day: 06/20 0701 - 06/21 0700 In: 4929.8 [I.V.:2502.8; Blood:197; IV Piggyback:510; TPN:1720] Out: 0370 [Chest Tube:400] Intake/Output this shift: Total I/O In: 574 [I.V.:324; IV Piggyback:50; TPN:200] Out: 771 [Other:701; Chest Tube:70]  General appearance: sedated Extremities: edema 2+ Skin: icteric  Lab Results:  Recent Labs  07/26/15 0453  07/26/15 1022 07/27/15 0310  WBC 26.2*  --   --  22.7*  HGB 8.6*  < > 11.9* 9.6*  HCT 25.4*  < > 35.0* 29.0*  PLT 108*  --   --  104*  < > = values in this interval not displayed. BMET:  Recent Labs  07/27/15 1545 07/28/15 0255  NA 134* 134*  K 4.2 4.6  CL 98* 100*  CO2 27 25  GLUCOSE 102* 99  BUN 52* 51*  CREATININE 2.12* 2.08*  CALCIUM 8.0* 7.7*   No results for input(s): PTH in the  last 72 hours. Iron Studies: No results for input(s): IRON, TIBC, TRANSFERRIN, FERRITIN in the last 72 hours. Studies/Results: Dg Chest Port 1 View  07/27/2015  CLINICAL DATA:  Acute respiratory failure. Tracheostomy tube placement. EXAM: PORTABLE CHEST 1 VIEW COMPARISON:  07/27/2015 FINDINGS: Compared to prior study earlier today, a new tracheostomy tube is seen in appropriate position. Left subclavian central venous catheter, feeding tube, and bilateral chest tubes remain in place. No residual pneumothorax visualized on current exam. Low lung volumes and bibasilar pulmonary opacity show no significant change, likely due to atelectasis. Cardiomegaly also remains stable. IMPRESSION: New tracheostomy tube in appropriate position. No definite residual pneumothorax visualized with bilateral chest tubes remain in place. No significant change in cardiomegaly and bibasilar atelectasis. Electronically Signed   By: Earle Gell M.D.   On: 07/27/2015 11:40   Dg Chest Port 1 View  07/27/2015  CLINICAL DATA:  Status post CABG on July 15, 2015 with sternal closure on July 23, 2015, intubated patient EXAM: PORTABLE CHEST 1 VIEW COMPARISON:  Portable chest x-ray of July 26, 2015. FINDINGS: The lungs are borderline hypoinflated. Persistent increased interstitial densities noted throughout the left lung and in the right infrahilar region. Bilateral chest tubes are in stable position. No left-sided pneumothorax is evident. A stable faint pleural line in the right pulmonary apex is noted. There is no pneumothorax nor large pleural effusion. The cardiac silhouette remains enlarged. The pulmonary vascularity is prominent centrally.  A mediastinal drain is present and stable. The endotracheal tube tip lies approximately 4.6 cm above the carina. The esophagogastric tube and the feeding tube tips project below the inferior margin of the image. Surgical skin staples are present in the midline to the left and over the right pectoral  region. IMPRESSION: Stable appearance of the support tubes. Slight interval improvement in the pulmonary interstitium since yesterday's study may reflect decreasing interstitial edema. Interval resolution of the left pneumothorax. Tiny less than 5% right apical pneumothorax persists. Electronically Signed   By: David  Martinique M.D.   On: 07/27/2015 07:34    Scheduled: . sodium chloride   Intravenous Once  . albumin human  12.5 g Intravenous Q6H  . antiseptic oral rinse  7 mL Mouth Rinse 10 times per day  . bisacodyl  10 mg Oral Daily   Or  . bisacodyl  10 mg Rectal Daily  . chlorhexidine gluconate (SAGE KIT)  15 mL Mouth Rinse BID  . darbepoetin (ARANESP) injection - NON-DIALYSIS  100 mcg Subcutaneous Q Fri-1800  . docusate sodium  200 mg Oral Daily  . feeding supplement (NEPRO CARB STEADY)  1,000 mL Per Tube Q24H  . fentaNYL (SUBLIMAZE) injection  200 mcg Intravenous Once  . imipenem-cilastatin  500 mg Intravenous Q8H  . insulin aspart  0-9 Units Subcutaneous Q6H  . insulin glargine  20 Units Subcutaneous BID  . levalbuterol  1.25 mg Nebulization Q6H  . metoCLOPramide (REGLAN) injection  10 mg Intravenous Q6H  . pantoprazole (PROTONIX) IV  40 mg Intravenous Q24H  . propofol  50 mL Intravenous Once  . vancomycin  1,000 mg Intravenous Q24H    LOS: 22 days   Joseph [name] 07/28/2015,11:27 AM

## 2015-07-28 NOTE — Progress Notes (Signed)
5 Days Post-Op Procedure(s) (LRB): STERNAL CLOSURE WITH PUMP STANDBY (N/A) TRANSESOPHAGEAL ECHOCARDIOGRAM (TEE) (N/A) Subjective: Status post high risk redo CABG with past history positive for renal failure on dialysis, thrombocytopenia with coagulopathy, ischemic LV cardiomyopathy, COPD  Postop coagulopathy and massive transfusion requirements with RV dysfunction and delayed sternal closure  Postoperative jaundice from massive transfusion requirements, preop  history of hepatic fibrosis  Postoperative leukocytosis with fever, possible pneumonia on broad-spectrum IV antibiotics-cultures nonrevealing  Status post tracheostomy June 20 for prolonged ventilator dependence  Renal replacement therapy with CVVH, temporary HD catheter left femoral vein  Objective: Vital signs in last 24 hours: Temp:  [97.8 F (36.6 C)-98.7 F (37.1 C)] 98.2 F (36.8 C) (06/21 0840) Pulse Rate:  [31-92] 77 (06/21 0723) Cardiac Rhythm:  [-] Normal sinus rhythm (06/21 0400) Resp:  [13-41] 35 (06/21 0723) BP: (85-181)/(45-61) 141/57 mmHg (06/21 0723) SpO2:  [100 %] 100 % (06/21 0715) Arterial Line BP: (87-196)/(38-75) 115/49 mmHg (06/21 0715) FiO2 (%):  [40 %] 40 % (06/21 0723) Weight:  [233 lb 4 oz (105.8 kg)] 233 lb 4 oz (105.8 kg) (06/21 0200)  Hemodynamic parameters for last 24 hours: CVP:  [9 mmHg-17 mmHg] 9 mmHg  Intake/Output from previous day: 06/20 0701 - 06/21 0700 In: 4929.8 [I.V.:2502.8; Blood:197; IV Piggyback:510; TPN:1720] Out: [id number] [Chest Tube:400] Intake/Output this shift: Total I/O In: 383.7 [I.V.:223.7; TPN:160] Out: 349 [Other:299; Chest Tube:50]  Moves all extremities but nonpurposeful Some tachypnea-dyssynchrony with the ventilator managed by level of sedation Minimal bleeding from tracheostomy site Surgical incisions clear and dry Deep jaundice with bilirubin slightly improved today  Lab Results:  Recent Labs  07/26/15 0453  07/26/15 1022 07/27/15 0310  WBC 26.2*  --    --  22.7*  HGB 8.6*  < > 11.9* 9.6*  HCT 25.4*  < > 35.0* 29.0*  PLT 108*  --   --  104*  < > = values in this interval not displayed. BMET:  Recent Labs  07/27/15 1545 07/28/15 0255  NA 134* 134*  K 4.2 4.6  CL 98* 100*  CO2 27 25  GLUCOSE 102* 99  BUN 52* 51*  CREATININE 2.12* 2.08*  CALCIUM 8.0* 7.7*    PT/INR:  Recent Labs  07/27/15 0310  LABPROT 16.5*  INR 1.32   ABG    Component Value Date/Time   PHART 7.403 07/28/2015 0855   HCO3 24.7* 07/28/2015 0855   TCO2 25.9 07/28/2015 0855   ACIDBASEDEF 0.6 07/21/2015 0415   O2SAT 96.1 07/28/2015 0855   CBG (last 3)   Recent Labs  07/27/15 2151 07/27/15 2253 07/28/15 0007  GLUCAP 106* 109* 105*    Assessment/Plan: S/P Procedure(s) (LRB): STERNAL CLOSURE WITH PUMP STANDBY (N/A) TRANSESOPHAGEAL ECHOCARDIOGRAM (TEE) (N/A) Continue low-dose levo fed and epinephrine for blood pressure support while on CRT-wean as possible  Plan on transitioning from TPN to enteral feedings with Nepro after postpyloric tube placed under fluoroscopy by IR  Finish ten-day course of IV antibiotics-vancomycin plus imipenem   LOS: 22 days    Tharon Aquas Trigt III 07/28/2015

## 2015-07-28 NOTE — Progress Notes (Signed)
PULMONARY / CRITICAL CARE MEDICINE   Name: Joseph [name] MRN: [id number] DOB: 1945/04/30    ADMISSION DATE:  06/12/2015 CONSULTATION DATE:  07/16/2015  REFERRING MD:  Nils Pyle  CHIEF COMPLAINT:  Post cardiothoracic surgery ventilator management  HISTORY OF PRESENT ILLNESS:   70 y/o male with ESRD and significant CAD (CABG 2001, DES 2016, EF 40-45%) was admitted on 5/29 with NSTEMI and had persistent chest pain during his hospitalization so he was taken for a redo CABG on 6/8 (SVG to diag and SVG to OM, IABP placement and CVL placement).  Post operatively he had excessive bleeding from all chest drains (pleural and mediastinal) so he was taken back to the OR on 6/9 for re-exploration.  PCCM was consulted for ventilator management.    SUBJECTIVE:  Remains critically ill, jaundiced. On cvvh. On higher epi drip.  No issues overnight. Higher epi drip overnight. On levophed as well.   VITAL SIGNS: BP 141/57 mmHg  Pulse 77  Temp(Src) 98.7 F (37.1 C) (Oral)  Resp 35  Ht 5\' 11"  (1.803 m)  Wt 105.8 kg (233 lb 4 oz)  BMI 32.55 kg/m2  SpO2 100%  HEMODYNAMICS: CVP:  [12 mmHg-17 mmHg] 12 mmHg  VENTILATOR SETTINGS: Vent Mode:  [-] PRVC FiO2 (%):  [40 %] 40 % Set Rate:  [16 bmp] 16 bmp Vt Set:  [450 mL-500 mL] 450 mL PEEP:  [5 cmH20] 5 cmH20 Plateau Pressure:  [21 cmH20] 21 cmH20  INTAKE / OUTPUT: I/O last 3 completed shifts: In: 7066.7 [I.V.:3659.7; Blood:197; NG/GT:60; IV Piggyback:710] Out: [id number] [Emesis/NG output:50; Other:9995; Chest Tube:600]  PHYSICAL EXAMINATION: General:  Critically ill on vent, CVVHD, jaundiced. Icteric.  Neuro:  Sedated. (-) purposeful mm noted.  HEENT:  Diffuse facial edema improved, ETT in place Cardiovascular:  median sternotomy dressing dry, decreased drainage out of mediastinal and pleural tubes. Tachycardic. Variable s1. (-) s3/m/r/g Lungs:  resps  labored on vent, rales throughout, diminished bibasilar crackles Abdomen:  Distended, some focal  superficial bruising, dec BS Musculoskeletal:  No bony abnormalitiies Skin:  Bruising all over extensor surfaces and belly, generalized 2++ edema  LABS:  BMET  Recent Labs Lab 07/27/15 0310 07/27/15 1545 07/28/15 0255  NA 136 134* 134*  K 4.4 4.2 4.6  CL 94* 98* 100*  CO2 31 27 25   BUN 50* 52* 51*  CREATININE 2.05* 2.12* 2.08*  GLUCOSE 116* 102* 99    Electrolytes  Recent Labs Lab 07/26/15 0453  07/27/15 0310 07/27/15 1545 07/28/15 0255  CALCIUM 9.2  < > 8.2* 8.0* 7.7*  MG 1.9  --  2.0  --  2.1  PHOS 3.6  < > 4.2 4.9* 5.3*  < > = values in this interval not displayed.  CBC  Recent Labs Lab 07/25/15 0355  07/26/15 0453  07/26/15 1017 07/26/15 1022 07/27/15 0310  WBC 31.8*  --  26.2*  --   --   --  22.7*  HGB 8.7*  < > 8.6*  < > 13.6 11.9* 9.6*  HCT 25.8*  < > 25.4*  < > 40.0 35.0* 29.0*  PLT 128*  --  108*  --   --   --  104*  < > = values in this interval not displayed.  Coag's  Recent Labs Lab 08/03/2015 1100 07/24/15 0430 07/25/15 0355 07/27/15 0310  APTT 34 38*  --  30  INR 1.86* 2.10* 1.89* 1.32   Sepsis Markers  Recent Labs Lab 07/26/2015 1100 07/24/15 0430 07/25/15 0355  PROCALCITON 11.41  12.60 7.21   ABG  Recent Labs Lab 07/26/15 1525 07/27/15 0849 07/28/15 0256  PHART 7.610* 7.535* 7.457*  PCO2ART 44.5 40.9 38.2  PO2ART 76.0* 90.0 81.0    Liver Enzymes  Recent Labs Lab 07/26/15 0453  07/27/15 0310 07/27/15 1545 07/28/15 0255  AST 110*  --  84*  --  80*  ALT 237*  --  187*  --  116*  ALKPHOS 161*  --  195*  --  196*  BILITOT 24.2*  --  38.4*  --  37.7*  ALBUMIN 1.9*  < > 1.9* 1.9* 1.8*  < > = values in this interval not displayed.  Cardiac Enzymes No results for input(s): TROPONINI, PROBNP in the last 168 hours.  Glucose  Recent Labs Lab 07/27/15 1824 07/27/15 2007 07/27/15 2105 07/27/15 2151 07/27/15 2253 07/28/15 0007  GLUCAP 133* 108* 111* 106* 109* 105*    Imaging Dg Chest Port 1 View  07/27/2015   CLINICAL DATA:  Acute respiratory failure. Tracheostomy tube placement. EXAM: PORTABLE CHEST 1 VIEW COMPARISON:  07/27/2015 FINDINGS: Compared to prior study earlier today, a new tracheostomy tube is seen in appropriate position. Left subclavian central venous catheter, feeding tube, and bilateral chest tubes remain in place. No residual pneumothorax visualized on current exam. Low lung volumes and bibasilar pulmonary opacity show no significant change, likely due to atelectasis. Cardiomegaly also remains stable. IMPRESSION: New tracheostomy tube in appropriate position. No definite residual pneumothorax visualized with bilateral chest tubes remain in place. No significant change in cardiomegaly and bibasilar atelectasis. Electronically Signed   By: Earle Gell M.D.   On: 07/27/2015 11:40   STUDIES:  6/14 TTE >EF 30-35%  CULTURES: Sputum 6/10 >> neg  ANTIBIOTICS: 6/8 Vanc >>6/16; 6/18 >>  6/10 Zosyn >>6/16 Primaxin 6/16>>>  SIGNIFICANT EVENTS: 6/8 Redo CABG 6/8 take back to OR emergently for diffuse bleeding 6/16 back to OR to close sternum  LINES/TUBES: Left forearm fistula 6/8 R IJ introducer >> 6/8 Multiple chest tubes (mediastinal and pleural bilateral) >> 6/8 CVC left femoral vein >>out 6/8 R radial arterial line >>6/17>>>6/17>>> 6/8 L IJ [place] >>6/17 L [place] TLC 6/16>>> ETT 6/8>>>  DISCUSSION: 70 y/o male s/p redo CABG 6/8 complicated by significant post operative bleeding in the setting of thrombocytopenia brought back to the Surgical ICU on 6/9 with hypoxemic respiratory failure.  He has baseline ESRD and DM2.  ASSESSMENT / PLAN:  PULMONARY A: Acute hypoxemic respiratory failure 2/2  Pulm edema, possible HCAP S/P Trache 6/20 by [facility] Post operative pleural bleeding Respiratory acidosis - suspect mostly V/Q mismatch from cardiogenic shock, improved. Now with resp alkalosis related to cvvh. ABG better on 6/21 Small R Pleural Effusion P:   Cont vent support. Not ready for  wean. Continue CRRT FiO2 40% post op and PEEP 5. Intermittent CXR Chest tubes per thoracic surgery, replaced CT per surgery.  CARDIOVASCULAR A:  Severe Cardiogenic and hemorrhagic shock CAD s/p redo CABG IABP d/c'd on 6/18 fPVCs P:  Epi drip has been increased. Cont levophed.  Pacing per TCTS. Tele monitoring. Started amiodarone drip 6/19  RENAL A:   ESRD CKD V P:   Continue CVVHD per renal -- tolerating volume removal on pressors, -150 ml/hr. Renal following Replace electrolytes as indicated.  GASTROINTESTINAL A:   Jaundice and coagulopathy due to shocked liver. P:   OG tube TPN per pharmacy due to high residuals. H2 blocker for stress ulcer prophylaxis Abdominal U/S negative for obstruction, likely shocked liver. Plan for IR guided  insertion of dubhoff  HEMATOLOGIC A:   Diffuse oozing due to coagulopathy from blood loss, thrombocytopenia. Improved.  P:  Transfuse per TCTS guidelines. Trend CBC, coags. Will give plt and ddavp prior to trache.   INFECTIOUS A:   ?HCAP - doubt, respiratory culture negative P:   Empiric abx primaxin Monitor fever curve / WBC. PCT elevated on 6/18 at 7.21  ENDOCRINE A:   DM2   P:   Monitor glucose Insulin gtt per TCTS post op protocol  NEUROLOGIC A:   Sedation needed  for vent synchrony P:   RASS goal: -2 Fentanyl gtt  Versed pushes  FAMILY  - Updates:  No family bedside 6/21.   - Inter-disciplinary family meet or Palliative Care meeting due by:  day 7  Critical care time with this patient today: 35 minutes.   Monica Becton, MD 07/28/2015, 7:38 AM [facility] Pager [phone] After 3 pm or if no answer, call [phone]

## 2015-07-28 NOTE — Progress Notes (Signed)
Pharmacy Antibiotic Note  Joseph [name] is a 70 y.o. male s/p redo CABG on 6/9 who continues on vancomycin and Primaxin s/p open sternum and closure.  Continues on CRRT with a vancomycin trough level of 22 mcg / dl (drawn approximately 2 hours early)  Plan: Continue Vancomycin 1g IV q24h  Continue Primaxin 500mg  IV q8h Monitor culture data, CRRT plans and clinical course   Height: 5\' 11"  (180.3 cm) Weight: 233 lb 4 oz (105.8 kg) IBW/kg (Calculated) : 75.3  Temp (24hrs), Avg:98.3 F (36.8 C), Min:97.9 F (36.6 C), Max:98.7 F (37.1 C)   Recent Labs Lab 07/24/15 0430 07/24/15 1130  07/25/15 0355  07/25/15 1223  07/26/15 0453  07/26/15 1528 07/27/15 0310 07/27/15 1545 07/28/15 0255 07/28/15 1118 07/28/15 1600  WBC 21.3*  --   --  31.8*  --   --   --  26.2*  --   --  22.7*  --   --  21.2*  --   CREATININE 4.33*  --   < > 3.24*  < >  --   < > 2.16*  < > 2.17* 2.05* 2.12* 2.08*  --  2.17*  VANCOTROUGH  --   --   --   --   --   --   --   --   --   --   --   --   --   --  22*  VANCORANDOM  --  35  --   --   --  21  --   --   --   --   --   --   --   --   --   < > = values in this interval not displayed.  Estimated Creatinine Clearance: 40.9 mL/min (by C-G formula based on Cr of 2.17).    Allergies  Allergen Reactions  . Sulfa Antibiotics Hives and Itching  . Morphine And Related     Makes patient feel weird    Antimicrobials this admission: 6/8 Vanc>> 6/9 Zosyn>>6/17 6/17 Primaxin>>  Dose adjustments this admission: 6/10 AM VR (CVTS ordered): 26 (only received 2 doses, CRRT off x 10 hours when level drawn) 6/11 AM VR (CVTS): 14 (no dose 6/10) - start 1250mg  q24 6/14 VT = 25 on 1250mg  q24 with CRRT (drawn ~ 1 hour early) 6/17 VR: 35 after vancomycin 1g 6/16 at 1135 (hold and recheck tomorrow AM) 6/18 VR: 21 after holding vancomycin 6/21 VT: 22 (drawn 2 hours early)  Microbiology results: 6/10 TA: neg 6/7 MRSA PCR: neg  Thank you Anette Guarneri,  PharmD [phone]  07/28/2015 4:53 PM

## 2015-07-29 ENCOUNTER — Inpatient Hospital Stay (HOSPITAL_COMMUNITY): Payer: Medicare Other

## 2015-07-29 LAB — RENAL FUNCTION PANEL
ALBUMIN: 2 g/dL — AB (ref 3.5–5.0)
ALBUMIN: 2.1 g/dL — AB (ref 3.5–5.0)
ANION GAP: 10 (ref 5–15)
Anion gap: 10 (ref 5–15)
BUN: 51 mg/dL — ABNORMAL HIGH (ref 6–20)
BUN: 50 mg/dL — AB (ref 6–20)
CALCIUM: 8 mg/dL — AB (ref 8.9–10.3)
CO2: 22 mmol/L (ref 22–32)
CO2: 20 mmol/L — ABNORMAL LOW (ref 22–32)
CREATININE: 2.24 mg/dL — AB (ref 0.61–1.24)
Calcium: 8.6 mg/dL — ABNORMAL LOW (ref 8.9–10.3)
Chloride: 102 mmol/L (ref 101–111)
Chloride: 102 mmol/L (ref 101–111)
Creatinine, Ser: 2.09 mg/dL — ABNORMAL HIGH (ref 0.61–1.24)
GFR calc non Af Amer: 31 mL/min — ABNORMAL LOW (ref >60)
GFR, EST AFRICAN AMERICAN: 36 mL/min — AB (ref >60)
GFR, EST AFRICAN AMERICAN: 33 mL/min — AB (ref >60)
GFR, EST NON AFRICAN AMERICAN: 29 mL/min — AB (ref >60)
GLUCOSE: 118 mg/dL — AB (ref 65–99)
Glucose, Bld: 130 mg/dL — ABNORMAL HIGH (ref 65–99)
PHOSPHORUS: 6.1 mg/dL — AB (ref 2.5–4.6)
PHOSPHORUS: 5.7 mg/dL — AB (ref 2.5–4.6)
Potassium: 4.6 mmol/L (ref 3.5–5.1)
Potassium: 4.5 mmol/L (ref 3.5–5.1)
Sodium: 134 mmol/L — ABNORMAL LOW (ref 135–145)
Sodium: 132 mmol/L — ABNORMAL LOW (ref 135–145)

## 2015-07-29 LAB — GLUCOSE, CAPILLARY
GLUCOSE-CAPILLARY: 107 mg/dL — AB (ref 65–99)
GLUCOSE-CAPILLARY: 127 mg/dL — AB (ref 65–99)
GLUCOSE-CAPILLARY: 133 mg/dL — AB (ref 65–99)
GLUCOSE-CAPILLARY: 144 mg/dL — AB (ref 65–99)
GLUCOSE-CAPILLARY: 149 mg/dL — AB (ref 65–99)
GLUCOSE-CAPILLARY: 151 mg/dL — AB (ref 65–99)
GLUCOSE-CAPILLARY: 154 mg/dL — AB (ref 65–99)
GLUCOSE-CAPILLARY: 158 mg/dL — AB (ref 65–99)
GLUCOSE-CAPILLARY: 196 mg/dL — AB (ref 65–99)
GLUCOSE-CAPILLARY: 93 mg/dL (ref 65–99)
Glucose-Capillary: 175 mg/dL — ABNORMAL HIGH (ref 65–99)
Glucose-Capillary: 147 mg/dL — ABNORMAL HIGH (ref 65–99)
Glucose-Capillary: 118 mg/dL — ABNORMAL HIGH (ref 65–99)
Glucose-Capillary: 133 mg/dL — ABNORMAL HIGH (ref 65–99)
Glucose-Capillary: 111 mg/dL — ABNORMAL HIGH (ref 65–99)
Glucose-Capillary: 117 mg/dL — ABNORMAL HIGH (ref 65–99)
Glucose-Capillary: 133 mg/dL — ABNORMAL HIGH (ref 65–99)
Glucose-Capillary: 127 mg/dL — ABNORMAL HIGH (ref 65–99)
Glucose-Capillary: 131 mg/dL — ABNORMAL HIGH (ref 65–99)
Glucose-Capillary: 142 mg/dL — ABNORMAL HIGH (ref 65–99)
Glucose-Capillary: 144 mg/dL — ABNORMAL HIGH (ref 65–99)
Glucose-Capillary: 148 mg/dL — ABNORMAL HIGH (ref 65–99)

## 2015-07-29 LAB — COMPREHENSIVE METABOLIC PANEL
ALT: 82 U/L — ABNORMAL HIGH (ref 17–63)
AST: 87 U/L — ABNORMAL HIGH (ref 15–41)
Albumin: 2.1 g/dL — ABNORMAL LOW (ref 3.5–5.0)
Alkaline Phosphatase: 332 U/L — ABNORMAL HIGH (ref 38–126)
Anion gap: 10 (ref 5–15)
BUN: 52 mg/dL — ABNORMAL HIGH (ref 6–20)
CO2: 21 mmol/L — ABNORMAL LOW (ref 22–32)
Calcium: 8 mg/dL — ABNORMAL LOW (ref 8.9–10.3)
Chloride: 101 mmol/L (ref 101–111)
Creatinine, Ser: 2.05 mg/dL — ABNORMAL HIGH (ref 0.61–1.24)
GFR calc Af Amer: 37 mL/min — ABNORMAL LOW (ref >60)
GFR calc non Af Amer: 32 mL/min — ABNORMAL LOW (ref >60)
Glucose, Bld: 122 mg/dL — ABNORMAL HIGH (ref 65–99)
Potassium: 4.6 mmol/L (ref 3.5–5.1)
Sodium: 132 mmol/L — ABNORMAL LOW (ref 135–145)
Total Bilirubin: 40.6 mg/dL (ref 0.3–1.2)
Total Protein: 5.6 g/dL — ABNORMAL LOW (ref 6.5–8.1)

## 2015-07-29 LAB — CBC
HCT: 25.4 % — ABNORMAL LOW (ref 39.0–52.0)
Hemoglobin: 8.4 g/dL — ABNORMAL LOW (ref 13.0–17.0)
MCH: 29.2 pg (ref 26.0–34.0)
MCHC: 33.1 g/dL (ref 30.0–36.0)
MCV: 88.2 fL (ref 78.0–100.0)
Platelets: 108 10*3/uL — ABNORMAL LOW (ref 150–400)
RBC: 2.88 MIL/uL — ABNORMAL LOW (ref 4.22–5.81)
RDW: 24.5 % — ABNORMAL HIGH (ref 11.5–15.5)
WBC: 19.7 10*3/uL — ABNORMAL HIGH (ref 4.0–10.5)

## 2015-07-29 LAB — POCT I-STAT 3, ART BLOOD GAS (G3+)
ACID-BASE DEFICIT: 2 mmol/L (ref 0.0–2.0)
BICARBONATE: 23.2 meq/L (ref 20.0–24.0)
O2 SAT: 96 %
PCO2 ART: 38.6 mmHg (ref 35.0–45.0)
PO2 ART: 81 mmHg (ref 80.0–100.0)
Patient temperature: 98.4
TCO2: 24 mmol/L (ref 0–100)
pH, Arterial: 7.386 (ref 7.350–7.450)

## 2015-07-29 LAB — CARBOXYHEMOGLOBIN
Carboxyhemoglobin: 1.9 % — ABNORMAL HIGH (ref 0.5–1.5)
Methemoglobin: 0.3 % (ref 0.0–1.5)
O2 Saturation: 64.6 %
Total hemoglobin: 8.5 g/dL — ABNORMAL LOW (ref 13.5–18.0)

## 2015-07-29 LAB — MAGNESIUM: Magnesium: 2.2 mg/dL (ref 1.7–2.4)

## 2015-07-29 LAB — VANCOMYCIN, TROUGH: Vancomycin Tr: 39 ug/mL (ref 10.0–20.0)

## 2015-07-29 LAB — AMMONIA: Ammonia: 15 umol/L (ref 9–35)

## 2015-07-29 LAB — CALCIUM, IONIZED: Calcium, Ionized, Serum: 4.5 mg/dL (ref 4.5–5.6)

## 2015-07-29 MED ORDER — PRISMASOL BGK 4/2.5 32-4-2.5 MEQ/L IV SOLN
INTRAVENOUS | Status: DC
  Administered 2015-07-29 – 2015-08-10 (×17): via INTRAVENOUS_CENTRAL
  Filled 2015-07-29 (×23): qty 5000

## 2015-07-29 MED ORDER — ALBUMIN HUMAN 5 % IV SOLN
12.5000 g | Freq: Once | INTRAVENOUS | Status: AC
  Administered 2015-07-29: 12.5 g via INTRAVENOUS
  Filled 2015-07-29: qty 250

## 2015-07-29 MED ORDER — SODIUM CHLORIDE 0.9 % IV SOLN
1.0000 g | Freq: Once | INTRAVENOUS | Status: DC
  Filled 2015-07-29: qty 10

## 2015-07-29 MED ORDER — INSULIN ASPART 100 UNIT/ML ~~LOC~~ SOLN
0.0000 [IU] | SUBCUTANEOUS | Status: DC
  Administered 2015-07-29 – 2015-07-30 (×4): 1 [IU] via SUBCUTANEOUS
  Administered 2015-07-30: 2 [IU] via SUBCUTANEOUS
  Administered 2015-07-30: 1 [IU] via SUBCUTANEOUS
  Administered 2015-07-30 – 2015-07-31 (×2): 2 [IU] via SUBCUTANEOUS
  Administered 2015-07-31: 1 [IU] via SUBCUTANEOUS
  Administered 2015-07-31: 2 [IU] via SUBCUTANEOUS
  Administered 2015-07-31: 1 [IU] via SUBCUTANEOUS
  Administered 2015-07-31 – 2015-08-01 (×3): 2 [IU] via SUBCUTANEOUS
  Administered 2015-08-01 – 2015-08-02 (×2): 1 [IU] via SUBCUTANEOUS
  Administered 2015-08-02: 2 [IU] via SUBCUTANEOUS
  Administered 2015-08-02: 1 [IU] via SUBCUTANEOUS
  Administered 2015-08-02: 2 [IU] via SUBCUTANEOUS
  Administered 2015-08-02: 1 [IU] via SUBCUTANEOUS
  Administered 2015-08-02 – 2015-08-03 (×2): 2 [IU] via SUBCUTANEOUS
  Administered 2015-08-03: 1 [IU] via SUBCUTANEOUS
  Administered 2015-08-03 (×2): 2 [IU] via SUBCUTANEOUS
  Administered 2015-08-04 (×2): 1 [IU] via SUBCUTANEOUS
  Administered 2015-08-04 – 2015-08-05 (×2): 2 [IU] via SUBCUTANEOUS
  Administered 2015-08-05: 1 [IU] via SUBCUTANEOUS
  Administered 2015-08-05 – 2015-08-06 (×4): 2 [IU] via SUBCUTANEOUS
  Administered 2015-08-06: 1 [IU] via SUBCUTANEOUS
  Administered 2015-08-06 (×2): 2 [IU] via SUBCUTANEOUS
  Administered 2015-08-06 – 2015-08-08 (×8): 1 [IU] via SUBCUTANEOUS
  Administered 2015-08-08: 2 [IU] via SUBCUTANEOUS
  Administered 2015-08-09 – 2015-08-10 (×5): 1 [IU] via SUBCUTANEOUS

## 2015-07-29 MED ORDER — NEPRO/CARBSTEADY PO LIQD
1000.0000 mL | ORAL | Status: DC
  Administered 2015-07-29: 1000 mL
  Filled 2015-07-29 (×3): qty 1000

## 2015-07-29 MED ORDER — CALCIUM CHLORIDE 10 % IV SOLN
1.0000 g | Freq: Once | INTRAVENOUS | Status: AC
  Administered 2015-07-29: 1 g via INTRAVENOUS
  Filled 2015-07-29: qty 10

## 2015-07-29 MED ORDER — PRO-STAT SUGAR FREE PO LIQD
60.0000 mL | Freq: Four times a day (QID) | ORAL | Status: DC
  Administered 2015-07-29 – 2015-08-07 (×37): 60 mL
  Filled 2015-07-29 (×33): qty 60

## 2015-07-29 MED ORDER — INSULIN GLARGINE 100 UNIT/ML ~~LOC~~ SOLN
25.0000 [IU] | Freq: Two times a day (BID) | SUBCUTANEOUS | Status: DC
Start: 2015-07-29 — End: 2015-08-10
  Administered 2015-07-29 – 2015-08-07 (×19): 25 [IU] via SUBCUTANEOUS
  Filled 2015-07-29 (×26): qty 0.25

## 2015-07-29 MED ORDER — INSULIN ASPART 100 UNIT/ML ~~LOC~~ SOLN: 0.0000 [IU] | SUBCUTANEOUS | Status: DC

## 2015-07-29 MED ORDER — NEPRO/CARBSTEADY PO LIQD
1000.0000 mL | ORAL | Status: DC
  Filled 2015-07-29: qty 1000

## 2015-07-29 MED ORDER — HEPARIN SODIUM (PORCINE) 1000 UNIT/ML DIALYSIS
1000.0000 [IU] | INTRAMUSCULAR | Status: DC | PRN
  Administered 2015-07-30: 2400 [IU] via INTRAVENOUS_CENTRAL
  Filled 2015-07-29: qty 6
  Filled 2015-07-29: qty 4
  Filled 2015-07-29: qty 6

## 2015-07-29 NOTE — Progress Notes (Signed)
6 Days Post-Op Procedure(s) (LRB): STERNAL CLOSURE WITH PUMP STANDBY (N/A) TRANSESOPHAGEAL ECHOCARDIOGRAM (TEE) (N/A) Subjective: Status post high risk redo CABG with past history positive for chronic renal failure on dialysis, thrombocytopenia with coagulopathy, ischemic LV cardiomyopathy, COPD, hepatic fibrosis  Severe postoperative coagulopathy and massive transfusion requirement Postoperative RV dysfunction with delayed sternal closure Postoperative fever with elevated white count, possible pneumonia on imipenem and vancomycin Status post tracheostomy for prolonged ventilator dependence Postoperative hepatic insufficiency from underlying hepatic fibrosis and massive transfusion requirements, bilirubin greater than 30 Postoperative temporary CRT via femoral vein dialysis catheter Postpyloric feeding tube placed yesterday by interventional radiology Objective: Vital signs in last 24 hours: Temp:  [97.4 F (36.3 C)-98.7 F (37.1 C)] 98.1 F (36.7 C) (06/22 0822) Pulse Rate:  [30-86] 83 (06/22 1115) Cardiac Rhythm:  [-] Normal sinus rhythm (06/22 0800) Resp:  [14-39] 19 (06/22 1115) BP: (92-134)/(47-78) 92/47 mmHg (06/22 1115) SpO2:  [100 %] 100 % (06/22 1115) Arterial Line BP: (70-191)/(28-72) 109/44 mmHg (06/22 1100) FiO2 (%):  [40 %] 40 % (06/22 1115) Weight:  [223 lb 15.8 oz (101.6 kg)] 223 lb 15.8 oz (101.6 kg) (06/22 0500)  Hemodynamic parameters for last 24 hours: CVP:  [11 mmHg-21 mmHg] 21 mmHg  Intake/Output from previous day: 06/21 0701 - 06/22 0700 In: 4163.9 [I.V.:2558; NG/GT:245.8; IV Piggyback:600; TPN:760] Out: [id number] [Chest Tube:300] Intake/Output this shift: Total I/O In: 475.3 [I.V.:415.3; NG/GT:60] Out: 495 [Other:465; Chest Tube:30]  Patient without purposeful response on ventilator and high-dose fentanyl sedation to prevent ventilator dyssynchrony Surgical incisions healing Mild peripheral edema Deep jaundice  Lab Results:  Recent Labs   07/28/15 1118 07/29/15 0355  WBC 21.2* 19.7*  HGB 8.7* 8.4*  HCT 26.2* 25.4*  PLT 119* 108*   BMET:  Recent Labs  07/28/15 1600 07/29/15 0355  NA 132* 132*  134*  K 4.5 4.6  4.6  CL 99* 101  102  CO2 23 21*  22  GLUCOSE 159* 122*  118*  BUN 54* 52*  51*  CREATININE 2.17* 2.05*  2.09*  CALCIUM 7.9* 8.0*  8.0*    PT/INR:  Recent Labs  07/27/15 0310  LABPROT 16.5*  INR 1.32   ABG    Component Value Date/Time   PHART 7.386 07/29/2015 0219   HCO3 23.2 07/29/2015 0219   TCO2 24 07/29/2015 0219   ACIDBASEDEF 2.0 07/29/2015 0219   O2SAT 64.6 07/29/2015 0406   CBG (last 3)   Recent Labs  07/29/15 0707 07/29/15 0810 07/29/15 0914  GLUCAP 131* 133* 144*    Assessment/Plan: S/P Procedure(s) (LRB): STERNAL CLOSURE WITH PUMP STANDBY (N/A) TRANSESOPHAGEAL ECHOCARDIOGRAM (TEE) (N/A) Advanced tube feeds with Nepro slowly Continue broad-spectrum antibiotics Stop amiodarone because of rising bilirubin, patient maintaining sinus rhythm Repeat blood cultures because of persistent elevated white count, chest x-ray is improving   LOS: 23 days    Tharon Aquas Trigt III 07/29/2015

## 2015-07-29 NOTE — Progress Notes (Signed)
      [address]       [place] [address]             [phone]      Sedation stopped, following commands  BP 104/58 mmHg  Pulse 74  Temp(Src) 98.6 F (37 C) (Axillary)  Resp 23  Ht 5\' 11"  (1.803 m)  Wt 223 lb 15.8 oz (101.6 kg)  BMI 31.25 kg/m2  SpO2 100%  Weaning epi and levophed  Creatinine up slightly to 2.24, K= 4.5   Intake/Output Summary (Last 24 hours) at 07/29/15 1821 Last data filed at 07/29/15 1800  Gross per 24 hour  Intake 3250.58 ml  Output   3464 ml  Net -213.42 ml    Continue current care  Steven C. Roxan Hockey, MD [facility] [phone]

## 2015-07-29 NOTE — Progress Notes (Signed)
Pharmacy Antibiotic Note  Joseph [name] is a 70 y.o. male s/p redo CABG on 6/9 who continues on vancomycin and Primaxin s/p open sternum and closure.    Continues on CRRT today. Random vancomycin level checked this afternoon by MD resulted at 39 mcg / dl (drawn approximately 4 hours early). Unclear why today's level is so drastically different from yesterday, removal rates do not appear to differ that much. Will hold further dosing and recheck random level in am.  Plan: Hold vancomycin for now - recheck random level in am Continue Primaxin 500mg  IV q8h Monitor culture data, CRRT plans and clinical course   Height: 5\' 11"  (180.3 cm) Weight: 223 lb 15.8 oz (101.6 kg) IBW/kg (Calculated) : 75.3  Temp (24hrs), Avg:98.3 F (36.8 C), Min:97.4 F (36.3 C), Max:98.7 F (37.1 C)   Recent Labs Lab 07/24/15 1130  07/25/15 0355  07/25/15 1223  07/26/15 0453  07/27/15 0310 07/27/15 1545 07/28/15 0255 07/28/15 1118 07/28/15 1600 07/29/15 0355 07/29/15 1400  WBC  --   --  31.8*  --   --   --  26.2*  --  22.7*  --   --  21.2*  --  19.7*  --   CREATININE  --   < > 3.24*  < >  --   < > 2.16*  < > 2.05* 2.12* 2.08*  --  2.17* 2.05*  2.09*  --   VANCOTROUGH  --   --   --   --   --   --   --   --   --   --   --   --  22*  --  39*  VANCORANDOM 35  --   --   --  21  --   --   --   --   --   --   --   --   --   --   < > = values in this interval not displayed.  Estimated Creatinine Clearance: 41.6 mL/min (by C-G formula based on Cr of 2.09).    Allergies  Allergen Reactions  . Sulfa Antibiotics Hives and Itching  . Morphine And Related     Makes patient feel weird    Antimicrobials this admission: 6/8 Vanc>> 6/9 Zosyn>>6/17 6/17 Primaxin>>  Dose adjustments this admission: 6/10 AM VR (CVTS ordered): 26 (only received 2 doses, CRRT off x 10 hours when level drawn) 6/11 AM VR (CVTS): 14 (no dose 6/10) - start 1250mg  q24 6/14 VT = 25 on 1250mg  q24 with CRRT (drawn ~ 1 hour  early) 6/17 VR: 35 after vancomycin 1g 6/16 at 1135 (hold and recheck tomorrow AM) 6/18 VR: 21 after holding vancomycin 6/21 VT: 22 (drawn 2 hours early) 6/22 VR: 39 drawn 4 hours early  Microbiology results: 6/10 TA: neg 6/7 MRSA PCR: neg  Thank you, Erin Hearing PharmD., BCPS Clinical Pharmacist Pager [phone] 07/29/2015 3:41 PM

## 2015-07-29 NOTE — Progress Notes (Signed)
eLink Physician-Brief Progress Note Patient Name: KOLBY GARRON DOB: 1945/11/15 MRN: [id number]   Date of Service  07/29/2015  HPI/Events of Note  Mild hypotension, vent dyssynchrony  eICU Interventions  Versed 1mg  x 1, check abg in am     Intervention Category Major Interventions: Respiratory failure - evaluation and management  Kynadi Dragos 07/29/2015, 12:15 AM

## 2015-07-29 NOTE — Progress Notes (Signed)
Nutrition Follow-up  DOCUMENTATION CODES:   Obesity unspecified  INTERVENTION:    Continue Nepro formula at goal rate of 20 ml/hr   Add Prostat liquid protein 60 ml QID   Total TF regimen to provide 1664 kcals, 159 gm protein, 349 ml free water daily  NUTRITION DIAGNOSIS:   Inadequate oral intake related to inability to eat as evidenced by NPO status, ongoing  GOAL:   Patient will meet greater than or equal to 90% of their needs, progressing  MONITOR:   TF tolerance, Vent status, Labs, Weight trends, Skin, I & O's  ASSESSMENT:   Pt s/p redo CABG 6/8 complicated by significant post operative bleeding in the setting of thrombocytopenia brought back to the Surgical ICU on 6/9 with hypoxemic respiratory failure. He has baseline ESRD and DM2.    Patient s/p procedure 6/16: STERNAL CLOSURE PLACEMENT OF LEFT SUBCLAVIAN VEIN CENTRAL LINE  Patient is currently on ventilator support >> trach  Temp (24hrs), Avg:98.3 F (36.8 C), Min:97.4 F (36.3 C), Max:98.7 F (37.1 C)   Pt continues on CVVHD. TPN discontinued 6/21. Nepro formula infusing at 20 ml/hr via small bore feeding (tip at proximal jejunum) providing 864 kcals, 39 gm protein, 349 ml of free water. On IV Reglan.  Diet Order:  Diet NPO time specified  Skin:  Wound (see comment) (Chest incisions )  Last BM:  6/20  Height:   Ht Readings from Last 1 Encounters:  06/23/2015 5\' 11"  (1.803 m)    Weight:   Wt Readings from Last 1 Encounters:  07/29/15 223 lb 15.8 oz (101.6 kg)    Ideal Body Weight:  78.1 kg  BMI:  Body mass index is 31.25 kg/(m^2).  Estimated Nutritional Needs:   Kcal:  [id number]   Protein:  150-160 gm  Fluid:  per MD  EDUCATION NEEDS:   No education needs identified at this time  Arthur Holms, RD, LDN Pager #: [phone] After-Hours Pager #: [phone]

## 2015-07-29 NOTE — Progress Notes (Addendum)
Elink notified of increased vent. Dyssynchrony despite sedation increase (fentyal currently at max) and lack of abg order in the a.m. Increase in sedation also requiring more pressor support. Will continue sedation, titrate drips as ordered, and continue to monitor closely.  Eleonore Chiquito RN 2 [employer]

## 2015-07-29 NOTE — Progress Notes (Signed)
PULMONARY / CRITICAL CARE MEDICINE   Name: Joseph [name] MRN: [id number] DOB: Feb 07, 1946    ADMISSION DATE:  06/29/2015 CONSULTATION DATE:  07/16/2015  REFERRING MD:  Nils Pyle  CHIEF COMPLAINT:  Post cardiothoracic surgery ventilator management  HISTORY OF PRESENT ILLNESS:   70 y/o male with ESRD and significant CAD (CABG 2001, DES 2016, EF 40-45%) was admitted on 5/29 with NSTEMI and had persistent chest pain during his hospitalization so he was taken for a redo CABG on 6/8 (SVG to diag and SVG to OM, IABP placement and CVL placement).  Post operatively he had excessive bleeding from all chest drains (pleural and mediastinal) so he was taken back to the OR on 6/9 for re-exploration.  PCCM was consulted for ventilator management.    SUBJECTIVE:  Remains critically ill, jaundiced. On cvvh.  Had vent dyssynchrony all night per RN.  RT switched to CPAP mode and pt looked more comfortable.  We switched back to VC mode and he was comfortable as well.  Still on pressors. Cutting down on pressors.   VITAL SIGNS: BP 100/57 mmHg  Pulse 78  Temp(Src) 98.1 F (36.7 C) (Oral)  Resp 18  Ht 5\' 11"  (1.803 m)  Wt 101.6 kg (223 lb 15.8 oz)  BMI 31.25 kg/m2  SpO2 100%  HEMODYNAMICS: CVP:  [11 mmHg-21 mmHg] 21 mmHg  VENTILATOR SETTINGS: Vent Mode:  [-] CPAP;PSV FiO2 (%):  [40 %] 40 % Set Rate:  [16 bmp] 16 bmp Vt Set:  [450 mL] 450 mL PEEP:  [5 cmH20] 5 cmH20 Pressure Support:  [5 cmH20-10 cmH20] 10 cmH20 Plateau Pressure:  [15 cmH20] 15 cmH20  INTAKE / OUTPUT: I/O last 3 completed shifts: In: 6560.3 [I.V.:3884.4; NG/GT:245.8; IV Piggyback:710] Out: [id number] [Other:7509; Chest Tube:540]  PHYSICAL EXAMINATION: General:  Critically ill on vent, CVVHD, jaundiced. Icteric.  Neuro:  Sedated. (-) purposeful mm noted.  HEENT:  Diffuse facial edema improved, ETT in place Cardiovascular:  median sternotomy dressing dry, decreased drainage out of mediastinal and pleural tubes. Tachycardic.  Variable s1. (-) s3/m/r/g Lungs:  resps  labored on vent, rales throughout, diminished bibasilar crackles Abdomen:  Distended, some focal superficial bruising, dec BS Musculoskeletal:  No bony abnormalitiies Skin:  Bruising all over extensor surfaces and belly, generalized 2++ edema  LABS:  BMET  Recent Labs Lab 07/28/15 0255 07/28/15 1600 07/29/15 0355  NA 134* 132* 132*  134*  K 4.6 4.5 4.6  4.6  CL 100* 99* 101  102  CO2 25 23 21*  22  BUN 51* 54* 52*  51*  CREATININE 2.08* 2.17* 2.05*  2.09*  GLUCOSE 99 159* 122*  118*    Electrolytes  Recent Labs Lab 07/27/15 0310  07/28/15 0255 07/28/15 1600 07/29/15 0355  CALCIUM 8.2*  < > 7.7* 7.9* 8.0*  8.0*  MG 2.0  --  2.1  --  2.2  PHOS 4.2  < > 5.3* 5.4* 6.1*  < > = values in this interval not displayed.  CBC  Recent Labs Lab 07/27/15 0310 07/28/15 1118 07/29/15 0355  WBC 22.7* 21.2* 19.7*  HGB 9.6* 8.7* 8.4*  HCT 29.0* 26.2* 25.4*  PLT 104* 119* 108*    Coag's  Recent Labs Lab 07/12/2015 1100 07/24/15 0430 07/25/15 0355 07/27/15 0310  APTT 34 38*  --  30  INR 1.86* 2.10* 1.89* 1.32   Sepsis Markers  Recent Labs Lab 08/06/2015 1100 07/24/15 0430 07/25/15 0355  PROCALCITON 11.41 12.60 7.21   ABG  Recent Labs Lab 07/28/15  0256 07/28/15 0855 07/29/15 0219  PHART 7.457* 7.403 7.386  PCO2ART 38.2 40.4 38.6  PO2ART 81.0 86.7 81.0    Liver Enzymes  Recent Labs Lab 07/27/15 0310  07/28/15 0255 07/28/15 1600 07/29/15 0355  AST 84*  --  80*  --  87*  ALT 187*  --  116*  --  82*  ALKPHOS 195*  --  196*  --  332*  BILITOT 38.4*  --  37.7*  --  40.6*  ALBUMIN 1.9*  < > 1.8* 2.1* 2.1*  2.0*  < > = values in this interval not displayed.  Cardiac Enzymes No results for input(s): TROPONINI, PROBNP in the last 168 hours.  Glucose  Recent Labs Lab 07/29/15 0355 07/29/15 0507 07/29/15 0602 07/29/15 0707 07/29/15 0810 07/29/15 0914  GLUCAP 117* 133* 127* 131* 133* 144*     Imaging Dg Abd 1 View  07/28/2015  CLINICAL DATA:  Feeding tube placement EXAM: ABDOMEN - 1 VIEW COMPARISON:  07/22/2015 FINDINGS: Feeding tube is present traversing stomach with tip projecting over LEFT upper quadrant. Injected contrast material opacifies a proximal jejunal loop. IMPRESSION: Tip of feeding tube at proximal jejunum. Electronically Signed   By: Lavonia Dana M.D.   On: 07/28/2015 15:02   Dg Chest Port 1 View  07/29/2015  CLINICAL DATA:  CABG.  Chest tube position EXAM: PORTABLE CHEST 1 VIEW COMPARISON:  07/27/2015 FINDINGS: Tracheostomy remains in good position. Feeding tube in place with the tip not visualized. Right basilar chest tube removed. Left basilar chest tube remains in place. No pneumothorax Bibasilar atelectasis slightly improved from the prior study. No significant pleural effusion. Central venous catheter tip in the SVC unchanged. IMPRESSION: Support lines remain in good position.  No pneumothorax Improvement in bibasilar atelectasis. Electronically Signed   By: Franchot Gallo M.D.   On: 07/29/2015 07:18   Dg Addison Bailey G Tube Plc W/fl-no Rad  07/28/2015  CLINICAL DATA:  NASO G TUBE PLACEMENT WITH FLUORO Fluoroscopy was utilized by the requesting physician.  No radiographic interpretation.   STUDIES:  6/14 TTE >EF 30-35%  CULTURES: Sputum 6/10 >> neg  ANTIBIOTICS: 6/8 Vanc >>6/16; 6/18 >>  6/10 Zosyn >>6/16 Primaxin 6/16>>>  SIGNIFICANT EVENTS: 6/8 Redo CABG 6/8 take back to OR emergently for diffuse bleeding 6/16 back to OR to close sternum  LINES/TUBES: Left forearm fistula 6/8 R IJ introducer >> 6/8 Multiple chest tubes (mediastinal and pleural bilateral) >> 6/8 CVC left femoral vein >>out 6/8 R radial arterial line >>6/17>>>6/17>>> 6/8 L IJ [place] >>6/17 L [place] TLC 6/16>>> ETT 6/8>>>  DISCUSSION: 70 y/o male s/p redo CABG 6/8 complicated by significant post operative bleeding in the setting of thrombocytopenia brought back to the Surgical ICU on  6/9 with hypoxemic respiratory failure.  He has baseline ESRD and DM2.  ASSESSMENT / PLAN:  PULMONARY A: Acute hypoxemic respiratory failure 2/2  Pulm edema, possible HCAP S/P Trache 6/20 by H[facility] Bible Post operative pleural bleeding  P:   Cont vent support. Daily PST as tolerated. Sedation being cut down.  Continue CRRT FiO2 40% post op and PEEP 5. Intermittent CXR Chest tubes per thoracic surgery, replaced CT per surgery.  CARDIOVASCULAR A:  Severe Cardiogenic and hemorrhagic shock CAD s/p redo CABG IABP d/c'd on 6/18 P:  Cont levophed and epi drips.  Pacing per TCTS. Tele monitoring. Started amiodarone drip 6/19  RENAL A:   ESRD CKD V P:   Continue CVVHD per renal -- tolerating volume removal on pressors Renal following Replace  electrolytes as indicated.  GASTROINTESTINAL A:   Jaundice and coagulopathy due to shocked liver. P:   OG tube TPN per pharmacy due to high residuals. H2 blocker for stress ulcer prophylaxis Abdominal U/S negative for obstruction, likely shocked liver.   HEMATOLOGIC A:   Diffuse oozing due to coagulopathy from blood loss, thrombocytopenia. Improved.  P:  Transfuse per TCTS guidelines. Trend CBC, coags.   INFECTIOUS A:   ?HCAP - doubt, respiratory culture negative P:   Empiric abx primaxin Monitor fever curve / WBC. PCT elevated on 6/18 at 7.21. Will check PCT in am.   ENDOCRINE A:   DM2   P:   Monitor glucose Insulin gtt per TCTS post op protocol  NEUROLOGIC A:   Sedation needed  for vent synchrony P:   RASS goal: -2 Fentanyl gtt > cutting back  Versed pushes  FAMILY  - Updates:  No family bedside 6/22 - Inter-disciplinary family meet or Palliative Care meeting due by:  day 7  Critical care time with this patient today: 35 minutes.   Monica Becton, MD 07/29/2015, 10:48 AM [place] [facility] Pager [phone] After 3 pm or if no answer, call [phone]

## 2015-07-29 NOTE — Progress Notes (Signed)
70 y.o. male with ESRD who was admitted 06/20/2015 with CP and subsequent redo CABG complicated by blood loss req transfusion and hemodynamic instability -s/p balloon pump, VDRF, shock, CRRT Assessment/Plan: 1. CAD- complicated redo CABG- on pressors, milrinone 2. ESRD - normally [place]- off and on CRRT here- resumed CRRT 6/17 via left groin HD cath (placed 6/8)- off citrate protocol due to alkalosis. Has left forearm AVF - Will increase pre filter fluid to prevent clotting 3. Anemia- has required transfusions-  4. HTN/volume- CRRT with volume removal 5. Inc LFTs/hyperbilirubinemia- shock  liver  6. Alkalosis, improved off citrate  Subjective: Interval History: Clotting CRRT filter  Objective: Vital signs in last 24 hours: Temp:  [97.4 F (36.3 C)-98.7 F (37.1 C)] 98.1 F (36.7 C) (06/22 0822) Pulse Rate:  [30-86] 77 (06/22 0715) Resp:  [17-39] 21 (06/22 0715) BP: (101-134)/(47-78) 134/78 mmHg (06/22 0700) SpO2:  [100 %] 100 % (06/22 0724) Arterial Line BP: (70-198)/(28-72) 83/36 mmHg (06/22 0715) FiO2 (%):  [40 %] 40 % (06/22 0724) Weight:  [101.6 kg (223 lb 15.8 oz)] 101.6 kg (223 lb 15.8 oz) (06/22 0500) Weight change: -4.2 kg (-9 lb 4.2 oz)  Intake/Output from previous day: 06/21 0701 - 06/22 0700 In: 4163.9 [I.V.:2558; NG/GT:245.8; IV Piggyback:600; TPN:760] Out: 4724 [Chest Tube:300] Intake/Output this shift: Total I/O In: 126.2 [I.V.:116.2; NG/GT:10] Out: 147 [Other:117; Chest Tube:30]  Skin: jaundiced Neurologic: Mental status: Alert, oriented, thought content appropriate, agitate  Lab Results:  Recent Labs  07/28/15 1118 07/29/15 0355  WBC 21.2* 19.7*  HGB 8.7* 8.4*  HCT 26.2* 25.4*  PLT 119* 108*   BMET:  Recent Labs  07/28/15 1600 07/29/15 0355  NA 132* 132*  134*  K 4.5 4.6  4.6  CL 99* 101  102  CO2 23 21*  22  GLUCOSE 159* 122*  118*  BUN 54* 52*  51*  CREATININE 2.17* 2.05*  2.09*  CALCIUM 7.9* 8.0*  8.0*   No  results for input(s): PTH in the last 72 hours. Iron Studies: No results for input(s): IRON, TIBC, TRANSFERRIN, FERRITIN in the last 72 hours. Studies/Results: Dg Abd 1 View  07/28/2015  CLINICAL DATA:  Feeding tube placement EXAM: ABDOMEN - 1 VIEW COMPARISON:  07/22/2015 FINDINGS: Feeding tube is present traversing stomach with tip projecting over LEFT upper quadrant. Injected contrast material opacifies a proximal jejunal loop. IMPRESSION: Tip of feeding tube at proximal jejunum. Electronically Signed   By: Lavonia Dana M.D.   On: 07/28/2015 15:02   Dg Chest Port 1 View  07/29/2015  CLINICAL DATA:  CABG.  Chest tube position EXAM: PORTABLE CHEST 1 VIEW COMPARISON:  07/27/2015 FINDINGS: Tracheostomy remains in good position. Feeding tube in place with the tip not visualized. Right basilar chest tube removed. Left basilar chest tube remains in place. No pneumothorax Bibasilar atelectasis slightly improved from the prior study. No significant pleural effusion. Central venous catheter tip in the SVC unchanged. IMPRESSION: Support lines remain in good position.  No pneumothorax Improvement in bibasilar atelectasis. Electronically Signed   By: Franchot Gallo M.D.   On: 07/29/2015 07:18   Dg Chest Port 1 View  07/27/2015  CLINICAL DATA:  Acute respiratory failure. Tracheostomy tube placement. EXAM: PORTABLE CHEST 1 VIEW COMPARISON:  07/27/2015 FINDINGS: Compared to prior study earlier today, a new tracheostomy tube is seen in appropriate position. Left subclavian central venous catheter, feeding tube, and bilateral chest tubes remain in place. No residual pneumothorax visualized on current exam. Low lung volumes and  bibasilar pulmonary opacity show no significant change, likely due to atelectasis. Cardiomegaly also remains stable. IMPRESSION: New tracheostomy tube in appropriate position. No definite residual pneumothorax visualized with bilateral chest tubes remain in place. No significant change in  cardiomegaly and bibasilar atelectasis. Electronically Signed   By: Earle Gell M.D.   On: 07/27/2015 11:40   Dg Addison Bailey G Tube Plc W/fl-no Rad  07/28/2015  CLINICAL DATA:  NASO G TUBE PLACEMENT WITH FLUORO Fluoroscopy was utilized by the requesting physician.  No radiographic interpretation.    Scheduled: . sodium chloride   Intravenous Once  . antiseptic oral rinse  7 mL Mouth Rinse 10 times per day  . bisacodyl  10 mg Oral Daily   Or  . bisacodyl  10 mg Rectal Daily  . chlorhexidine gluconate (SAGE KIT)  15 mL Mouth Rinse BID  . darbepoetin (ARANESP) injection - NON-DIALYSIS  100 mcg Subcutaneous Q Fri-1800  . docusate sodium  200 mg Oral Daily  . feeding supplement (NEPRO CARB STEADY)  1,000 mL Per Tube Q24H  . fentaNYL (SUBLIMAZE) injection  200 mcg Intravenous Once  . imipenem-cilastatin  500 mg Intravenous Q8H  . insulin aspart  0-9 Units Subcutaneous Q6H  . insulin glargine  20 Units Subcutaneous BID  . levalbuterol  1.25 mg Nebulization Q6H  . metoCLOPramide (REGLAN) injection  10 mg Intravenous Q6H  . pantoprazole (PROTONIX) IV  40 mg Intravenous Q24H  . propofol  50 mL Intravenous Once  . vancomycin  1,000 mg Intravenous Q24H       LOS: 23 days   Azia Toutant C 07/29/2015,8:31 AM

## 2015-07-30 ENCOUNTER — Inpatient Hospital Stay (HOSPITAL_COMMUNITY): Payer: Medicare Other

## 2015-07-30 LAB — COMPREHENSIVE METABOLIC PANEL
ALT: 92 U/L — ABNORMAL HIGH (ref 17–63)
AST: 179 U/L — ABNORMAL HIGH (ref 15–41)
Albumin: 2.1 g/dL — ABNORMAL LOW (ref 3.5–5.0)
Alkaline Phosphatase: 565 U/L — ABNORMAL HIGH (ref 38–126)
Anion gap: 7 (ref 5–15)
BUN: 48 mg/dL — ABNORMAL HIGH (ref 6–20)
CO2: 23 mmol/L (ref 22–32)
Calcium: 8.5 mg/dL — ABNORMAL LOW (ref 8.9–10.3)
Chloride: 104 mmol/L (ref 101–111)
Creatinine, Ser: 2.15 mg/dL — ABNORMAL HIGH (ref 0.61–1.24)
GFR calc Af Amer: 35 mL/min — ABNORMAL LOW (ref >60)
GFR calc non Af Amer: 30 mL/min — ABNORMAL LOW (ref >60)
Glucose, Bld: 117 mg/dL — ABNORMAL HIGH (ref 65–99)
Potassium: 4.6 mmol/L (ref 3.5–5.1)
Sodium: 134 mmol/L — ABNORMAL LOW (ref 135–145)
Total Bilirubin: 46.5 mg/dL (ref 0.3–1.2)
Total Protein: 5.6 g/dL — ABNORMAL LOW (ref 6.5–8.1)

## 2015-07-30 LAB — CARBOXYHEMOGLOBIN
Carboxyhemoglobin: 2.4 % — ABNORMAL HIGH (ref 0.5–1.5)
Carboxyhemoglobin: 2.2 % — ABNORMAL HIGH (ref 0.5–1.5)
Methemoglobin: 0.2 % (ref 0.0–1.5)
Methemoglobin: 0 % (ref 0.0–1.5)
O2 Saturation: 71.8 %
O2 Saturation: 58.2 %
Total hemoglobin: 7.3 g/dL — ABNORMAL LOW (ref 13.5–18.0)
Total hemoglobin: 8 g/dL — ABNORMAL LOW (ref 13.5–18.0)

## 2015-07-30 LAB — GLUCOSE, CAPILLARY
GLUCOSE-CAPILLARY: 105 mg/dL — AB (ref 65–99)
GLUCOSE-CAPILLARY: 121 mg/dL — AB (ref 65–99)
Glucose-Capillary: 100 mg/dL — ABNORMAL HIGH (ref 65–99)
Glucose-Capillary: 119 mg/dL — ABNORMAL HIGH (ref 65–99)
Glucose-Capillary: 162 mg/dL — ABNORMAL HIGH (ref 65–99)

## 2015-07-30 LAB — POCT I-STAT 3, ART BLOOD GAS (G3+)
ACID-BASE EXCESS: 1 mmol/L (ref 0.0–2.0)
Acid-base deficit: 2 mmol/L (ref 0.0–2.0)
BICARBONATE: 26.8 meq/L — AB (ref 20.0–24.0)
Bicarbonate: 23.2 mEq/L (ref 20.0–24.0)
O2 SAT: 97 %
O2 SAT: 100 %
PCO2 ART: 38.3 mmHg (ref 35.0–45.0)
PCO2 ART: 45.6 mmHg — AB (ref 35.0–45.0)
PH ART: 7.391 (ref 7.350–7.450)
PO2 ART: 269 mmHg — AB (ref 80.0–100.0)
Patient temperature: 99
Patient temperature: 98.5
TCO2: 24 mmol/L (ref 0–100)
TCO2: 28 mmol/L (ref 0–100)
pH, Arterial: 7.377 (ref 7.350–7.450)
pO2, Arterial: 96 mmHg (ref 80.0–100.0)

## 2015-07-30 LAB — RENAL FUNCTION PANEL
ANION GAP: 9 (ref 5–15)
Albumin: 1.9 g/dL — ABNORMAL LOW (ref 3.5–5.0)
BUN: 49 mg/dL — ABNORMAL HIGH (ref 6–20)
CHLORIDE: 103 mmol/L (ref 101–111)
CO2: 21 mmol/L — AB (ref 22–32)
Calcium: 8.2 mg/dL — ABNORMAL LOW (ref 8.9–10.3)
Creatinine, Ser: 2.12 mg/dL — ABNORMAL HIGH (ref 0.61–1.24)
GFR calc non Af Amer: 31 mL/min — ABNORMAL LOW (ref >60)
GFR, EST AFRICAN AMERICAN: 35 mL/min — AB (ref >60)
Glucose, Bld: 119 mg/dL — ABNORMAL HIGH (ref 65–99)
POTASSIUM: 4.6 mmol/L (ref 3.5–5.1)
Phosphorus: 5.3 mg/dL — ABNORMAL HIGH (ref 2.5–4.6)
Sodium: 133 mmol/L — ABNORMAL LOW (ref 135–145)

## 2015-07-30 LAB — CBC
HCT: 24.4 % — ABNORMAL LOW (ref 39.0–52.0)
Hemoglobin: 8 g/dL — ABNORMAL LOW (ref 13.0–17.0)
MCH: 28.9 pg (ref 26.0–34.0)
MCHC: 32.8 g/dL (ref 30.0–36.0)
MCV: 88.1 fL (ref 78.0–100.0)
Platelets: 115 10*3/uL — ABNORMAL LOW (ref 150–400)
RBC: 2.77 MIL/uL — ABNORMAL LOW (ref 4.22–5.81)
RDW: 25.6 % — ABNORMAL HIGH (ref 11.5–15.5)
WBC: 13.7 10*3/uL — ABNORMAL HIGH (ref 4.0–10.5)

## 2015-07-30 LAB — CALCIUM, IONIZED: Calcium, Ionized, Serum: 4.5 mg/dL (ref 4.5–5.6)

## 2015-07-30 LAB — MAGNESIUM: Magnesium: 2.7 mg/dL — ABNORMAL HIGH (ref 1.7–2.4)

## 2015-07-30 LAB — VANCOMYCIN, RANDOM: Vancomycin Rm: 22 ug/mL

## 2015-07-30 LAB — PHOSPHORUS: Phosphorus: 5.7 mg/dL — ABNORMAL HIGH (ref 2.5–4.6)

## 2015-07-30 MED ORDER — DEXTROSE 10 % IV SOLN
INTRAVENOUS | Status: DC
  Administered 2015-07-30: 08:00:00 via INTRAVENOUS

## 2015-07-30 MED ORDER — QUETIAPINE FUMARATE 25 MG PO TABS
50.0000 mg | ORAL_TABLET | Freq: Two times a day (BID) | ORAL | Status: DC
  Administered 2015-07-30 – 2015-08-07 (×17): 50 mg via ORAL
  Filled 2015-07-30 (×17): qty 2

## 2015-07-30 MED ORDER — NEPRO/CARBSTEADY PO LIQD
1000.0000 mL | ORAL | Status: DC
  Administered 2015-07-30: 1000 mL
  Filled 2015-07-30 (×3): qty 1000

## 2015-07-30 MED ORDER — NEPRO/CARBSTEADY PO LIQD
1000.0000 mL | ORAL | Status: DC
  Filled 2015-07-30 (×2): qty 1000

## 2015-07-30 MED ORDER — VANCOMYCIN HCL IN DEXTROSE 1-5 GM/200ML-% IV SOLN
1000.0000 mg | Freq: Once | INTRAVENOUS | Status: AC
  Administered 2015-07-30: 1000 mg via INTRAVENOUS
  Filled 2015-07-30: qty 200

## 2015-07-30 NOTE — Care Management Important Message (Signed)
Important Message  Patient Details  Name: Joseph [name] MRN: [id number] Date of Birth: [birth year]   Medicare Important Message Given:  Yes    [name] May 07/30/2015, 10:27 AM

## 2015-07-30 NOTE — Progress Notes (Addendum)
PULMONARY / CRITICAL CARE MEDICINE   Name: Joseph [name] MRN: [id number] DOB: 04-30-1945    ADMISSION DATE:  06/14/2015 CONSULTATION DATE:  07/16/2015  REFERRING MD:  Nils Pyle  CHIEF COMPLAINT:  Post cardiothoracic surgery ventilator management  HISTORY OF PRESENT ILLNESS:   70 y/o male with ESRD and significant CAD (CABG 2001, DES 2016, EF 40-45%) was admitted on 5/29 with NSTEMI and had persistent chest pain during his hospitalization so he was taken for a redo CABG on 6/8 (SVG to diag and SVG to OM, IABP placement and CVL placement).  Post operatively he had excessive bleeding from all chest drains (pleural and mediastinal) so he was taken back to the OR on 6/9 for re-exploration.  PCCM was consulted for ventilator management.    SUBJECTIVE:  Remains critically ill, jaundiced. On cvvh.  Weaning off sedation > sticks out his tongue per RN.  Pt is comfortable on cpap mode Inc trache secretions   VITAL SIGNS: BP 77/42 mmHg  Pulse 78  Temp(Src) 98.5 F (36.9 C) (Axillary)  Resp 17  Ht 5\' 11"  (1.803 m)  Wt 99.6 kg (219 lb 9.3 oz)  BMI 30.64 kg/m2  SpO2 100%  HEMODYNAMICS: CVP:  [11 mmHg-27 mmHg] 14 mmHg  VENTILATOR SETTINGS: Vent Mode:  [-] CPAP;PSV FiO2 (%):  [40 %] 40 % PEEP:  [5 cmH20] 5 cmH20 Pressure Support:  [10 cmH20] 10 cmH20  INTAKE / OUTPUT: I/O last 3 completed shifts: In: 4443.4 [I.V.:3268.4; NG/GT:650; IV Piggyback:525] Out: [id number] [Other:4416; Chest Tube:330]  PHYSICAL EXAMINATION: General:  Critically ill on vent, CVVHD, jaundiced. Icteric.  Neuro:  Sedated. (-) purposeful mm noted per my exam HEENT:  Diffuse facial edema improved, ETT in place Cardiovascular:  median sternotomy dressing dry, decreased drainage out of mediastinal and pleural tubes. Good s1/s2. (-) s3/m/r/g Lungs:  Fair ae. Rhonchi in BLF which decrease with suctioning Abdomen:  Distended, some focal superficial bruising, dec BS Musculoskeletal:  No bony abnormalitiies Skin:   Bruising all over extensor surfaces and belly, Gr 1 edema  LABS:  BMET  Recent Labs Lab 07/29/15 0355 07/29/15 1600 07/30/15 0403  NA 132*  134* 132* 134*  K 4.6  4.6 4.5 4.6  CL 101  102 102 104  CO2 21*  22 20* 23  BUN 52*  51* 50* 48*  CREATININE 2.05*  2.09* 2.24* 2.15*  GLUCOSE 122*  118* 130* 117*    Electrolytes  Recent Labs Lab 07/28/15 0255  07/29/15 0355 07/29/15 1600 07/30/15 0403  CALCIUM 7.7*  < > 8.0*  8.0* 8.6* 8.5*  MG 2.1  --  2.2  --  2.7*  PHOS 5.3*  < > 6.1* 5.7* 5.7*  < > = values in this interval not displayed.  CBC  Recent Labs Lab 07/28/15 1118 07/29/15 0355 07/30/15 0403  WBC 21.2* 19.7* 13.7*  HGB 8.7* 8.4* 8.0*  HCT 26.2* 25.4* 24.4*  PLT 119* 108* 115*    Coag's  Recent Labs Lab 08/03/2015 1100 07/24/15 0430 07/25/15 0355 07/27/15 0310  APTT 34 38*  --  30  INR 1.86* 2.10* 1.89* 1.32   Sepsis Markers  Recent Labs Lab 07/13/2015 1100 07/24/15 0430 07/25/15 0355  PROCALCITON 11.41 12.60 7.21   ABG  Recent Labs Lab 07/28/15 0855 07/29/15 0219 07/30/15 0414  PHART 7.403 7.386 7.391  PCO2ART 40.4 38.6 38.3  PO2ART 86.7 81.0 96.0    Liver Enzymes  Recent Labs Lab 07/28/15 0255  07/29/15 0355 07/29/15 1600 07/30/15 0403  AST 80*  --  87*  --  179*  ALT 116*  --  82*  --  92*  ALKPHOS 196*  --  332*  --  565*  BILITOT 37.7*  --  40.6*  --  46.5*  ALBUMIN 1.8*  < > 2.1*  2.0* 2.1* 2.1*  < > = values in this interval not displayed.  Cardiac Enzymes No results for input(s): TROPONINI, PROBNP in the last 168 hours.  Glucose  Recent Labs Lab 07/29/15 1110 07/29/15 1209 07/29/15 1315 07/29/15 1929 07/29/15 2333 07/30/15 0358  GLUCAP 151* 144* 148* 149* 154* 100*    Imaging No results found. STUDIES:  6/14 TTE >EF 30-35%  CULTURES: Sputum 6/10 >> neg  ANTIBIOTICS: 6/8 Vanc >>6/16; 6/18 >>  6/10 Zosyn >>6/16 Primaxin 6/16>>>  SIGNIFICANT EVENTS: 6/8 Redo CABG 6/8 take back to  OR emergently for diffuse bleeding 6/16 back to OR to close sternum  LINES/TUBES: Left forearm fistula 6/8 R IJ introducer >> 6/8 Multiple chest tubes (mediastinal and pleural bilateral) >> 6/8 CVC left femoral vein >>out 6/8 R radial arterial line >>6/17>>>6/17>>> 6/8 L IJ [place] >>6/17 L [place] TLC 6/16>>> ETT 6/8>>>  DISCUSSION: 70 y/o male s/p redo CABG 6/8 complicated by significant post operative bleeding in the setting of thrombocytopenia brought back to the Surgical ICU on 6/9 with hypoxemic respiratory failure.  He has baseline ESRD and DM2.  ASSESSMENT / PLAN:  PULMONARY A: Acute hypoxemic respiratory failure 2/2  Pulm edema, possible HCAP S/P Trache 6/20 by [facility] Post operative pleural bleeding  P:   Cont vent support. Daily PST as tolerated. Sedation being cut down.  Currently looks comfortable on PST 10/5 > plan to dec PS today and try TC if he tolerates it.  Inc in secretions per trache > observe for now; (-) fever, (-) inc in WBC Continue CRRT FiO2 40% post op and PEEP 5. Intermittent CXR Chest tubes per thoracic surgery, replaced CT per surgery.  CARDIOVASCULAR A:  Severe Cardiogenic and hemorrhagic shock CAD s/p redo CABG IABP d/c'd on 6/18 P:  Cont levophed and epi drips.  Pacing per TCTS. Tele monitoring. Started amiodarone drip 6/19  RENAL A:   ESRD CKD V P:   Continue CVVHD per renal -- tolerating volume removal on pressors Renal following Replace electrolytes as indicated. Plan for [facility] HD cath today per TCVS.   GASTROINTESTINAL A:   Jaundice and coagulopathy due to shocked liver. P:   OG tube TPN per pharmacy due to high residuals. H2 blocker for stress ulcer prophylaxis Abdominal U/S negative for obstruction, likely shocked liver.   HEMATOLOGIC A:   Diffuse oozing due to coagulopathy from blood loss, thrombocytopenia. Improved.  P:  Transfuse per TCTS guidelines. Trend CBC, coags.   INFECTIOUS A:   ?HCAP - doubt, respiratory  culture negative P:   Empiric abx primaxin (D7) Monitor fever curve / WBC. Inc in trache secretions today > will observe.  PCT elevated on 6/18 at 7.21.   ENDOCRINE A:   DM2   P:   Monitor glucose Insulin gtt per TCTS post op protocol  NEUROLOGIC A:   Sedation needed  for vent synchrony P:   RASS goal: -2 Fentanyl gtt > cutting back  Versed pushes  FAMILY  - Updates:  No family bedside 6/23 - Inter-disciplinary family meet or Palliative Care meeting due by:  day 7  Critical care time with this patient today: 31  minutes.   Monica Becton, MD 07/30/2015, 8:09 AM [facility] Pager [phone] After 3 pm or if no answer, call [phone]

## 2015-07-30 NOTE — Progress Notes (Signed)
70 y.o. male with ESRD who was admitted 06/28/2015 with CP and subsequent redo CABG complicated by blood loss req transfusion and hemodynamic instability -s/p balloon pump, VDRF, shock, CRRT Assessment/Plan: 1. CAD- complicated redo CABG- on pressors, milrinone 2. ESRD - normally [place]- off and on CRRT here- resumed CRRT 6/17 via left groin HD cath (placed 6/8)- off citrate protocol due to alkalosis. Has left forearm AVF - Increased fluids pre filter fluid to prevent clotting.  Cont CRRT same Rx 3. Anemia- has required transfusions-  4. HTN/volume- CRRT with volume removal 5. Inc LFTs/hyperbilirubinemia  6. Alkalosis, improved off citrate, problematic usage with liver disease  Subjective: Interval History: lightening sedation  Objective: Vital signs in last 24 hours: Temp:  [98.4 F (36.9 C)-99.1 F (37.3 C)] 98.4 F (36.9 C) (06/23 1213) Pulse Rate:  [69-99] 85 (06/23 1145) Resp:  [14-40] 30 (06/23 1145) BP: (75-131)/(37-70) 91/37 mmHg (06/23 1115) SpO2:  [100 %] 100 % (06/23 1145) Arterial Line BP: (74-192)/(19-61) 110/43 mmHg (06/23 1145) FiO2 (%):  [40 %] 40 % (06/23 1115) Weight:  [99.6 kg (219 lb 9.3 oz)] 99.6 kg (219 lb 9.3 oz) (06/23 0500) Weight change: -2 kg (-4 lb 6.6 oz)  Intake/Output from previous day: 06/22 0701 - 06/23 0700 In: 2899.2 [I.V.:2004.2; NG/GT:470; IV Piggyback:425] Out: 2987 [Chest Tube:170] Intake/Output this shift: Total I/O In: 663.1 [I.V.:433.1; NG/GT:30; IV Piggyback:200] Out: 596 [Other:596]  jaundiced  Reportedly followed some commands, but not for me   Lab Results:  Recent Labs  07/29/15 0355 07/30/15 0403  WBC 19.7* 13.7*  HGB 8.4* 8.0*  HCT 25.4* 24.4*  PLT 108* 115*   BMET:  Recent Labs  07/29/15 1600 07/30/15 0403  NA 132* 134*  K 4.5 4.6  CL 102 104  CO2 20* 23  GLUCOSE 130* 117*  BUN 50* 48*  CREATININE 2.24* 2.15*  CALCIUM 8.6* 8.5*   No results for input(s): PTH in the last 72 hours. Iron  Studies: No results for input(s): IRON, TIBC, TRANSFERRIN, FERRITIN in the last 72 hours. Studies/Results: Dg Abd 1 View  07/28/2015  CLINICAL DATA:  Feeding tube placement EXAM: ABDOMEN - 1 VIEW COMPARISON:  07/22/2015 FINDINGS: Feeding tube is present traversing stomach with tip projecting over LEFT upper quadrant. Injected contrast material opacifies a proximal jejunal loop. IMPRESSION: Tip of feeding tube at proximal jejunum. Electronically Signed   By: Lavonia Dana M.D.   On: 07/28/2015 15:02   Dg Chest Port 1 View  07/30/2015  CLINICAL DATA:  Ventilator dependence. EXAM: PORTABLE CHEST 1 VIEW COMPARISON:  07/29/2015 FINDINGS: 0554 hours. Tracheostomy tube remains in place. A feeding tube passes into the stomach although the distal tip position is not included on the film. Left chest tube again noted without substantial change. Left subclavian central line tip overlies the mid to distal SVC level. Lung volumes remain low with vascular congestion and basilar atelectasis. No pneumothorax. The cardio pericardial silhouette is enlarged. Patient is status post median sternotomy. Telemetry leads overlie the chest. IMPRESSION: No substantial change. Vascular congestion with basilar atelectasis. Electronically Signed   By: Misty Stanley M.D.   On: 07/30/2015 08:12   Dg Chest Port 1 View  07/29/2015  CLINICAL DATA:  CABG.  Chest tube position EXAM: PORTABLE CHEST 1 VIEW COMPARISON:  07/27/2015 FINDINGS: Tracheostomy remains in good position. Feeding tube in place with the tip not visualized. Right basilar chest tube removed. Left basilar chest tube remains in place. No pneumothorax Bibasilar atelectasis slightly improved from the  prior study. No significant pleural effusion. Central venous catheter tip in the SVC unchanged. IMPRESSION: Support lines remain in good position.  No pneumothorax Improvement in bibasilar atelectasis. Electronically Signed   By: Franchot Gallo M.D.   On: 07/29/2015 07:18   Dg Addison Bailey G  Tube Plc W/fl-no Rad  07/28/2015  CLINICAL DATA:  NASO G TUBE PLACEMENT WITH FLUORO Fluoroscopy was utilized by the requesting physician.  No radiographic interpretation.    Scheduled: . sodium chloride   Intravenous Once  . antiseptic oral rinse  7 mL Mouth Rinse 10 times per day  . bisacodyl  10 mg Oral Daily   Or  . bisacodyl  10 mg Rectal Daily  . chlorhexidine gluconate (SAGE KIT)  15 mL Mouth Rinse BID  . darbepoetin (ARANESP) injection - NON-DIALYSIS  100 mcg Subcutaneous Q Fri-1800  . docusate sodium  200 mg Oral Daily  . feeding supplement (NEPRO CARB STEADY)  1,000 mL Per Tube Q24H  . feeding supplement (PRO-STAT SUGAR FREE 64)  60 mL Per Tube QID  . fentaNYL (SUBLIMAZE) injection  200 mcg Intravenous Once  . imipenem-cilastatin  500 mg Intravenous Q8H  . insulin aspart  0-9 Units Subcutaneous Q4H  . insulin glargine  25 Units Subcutaneous BID  . levalbuterol  1.25 mg Nebulization Q6H  . metoCLOPramide (REGLAN) injection  10 mg Intravenous Q6H  . pantoprazole (PROTONIX) IV  40 mg Intravenous Q24H  . propofol  50 mL Intravenous Once     LOS: 24 days   Darivs Lunden C 07/30/2015,12:25 PM

## 2015-07-30 NOTE — Progress Notes (Signed)
Patient ID: Joseph [name], male   DOB: 19-Sep-1945, 70 y.o.   MRN: [id number] EVENING ROUNDS NOTE :     [address]       [name] [address]             [phone]                 7 Days Post-Op Procedure(s) (LRB): STERNAL CLOSURE WITH PUMP STANDBY (N/A) TRANSESOPHAGEAL ECHOCARDIOGRAM (TEE) (N/A)  Total Length of Stay:  LOS: 24 days  BP 105/46 mmHg  Pulse 86  Temp(Src) 99 F (37.2 C) (Axillary)  Resp 24  Ht 5\' 11"  (1.803 m)  Wt 219 lb 9.3 oz (99.6 kg)  BMI 30.64 kg/m2  SpO2 100%  .Intake/Output      06/23 0701 - 06/24 0700   I.V. (mL/kg) 1208.7 (12.1)   NG/GT 692   IV Piggyback 300   Total Intake(mL/kg) 2200.7 (22.1)   Other 1844   Chest Tube 50   Total Output 1894   Net +306.7         . sodium chloride 20 mL/hr at 07/30/15 2000  . EPINEPHrine 4 mg in dextrose 5% 250 mL infusion (16 mcg/mL) 8 mcg/min (07/30/15 2000)  . fentaNYL infusion INTRAVENOUS 25 mcg/hr (07/30/15 2000)  . midazolam (VERSED) infusion Stopped (07/26/15 0852)  . milrinone 0.125 mcg/kg/min (07/30/15 2000)  . norepinephrine (LEVOPHED) Adult infusion 34.987 mcg/min (07/30/15 2000)  . dialysis replacement fluid (prismasate) 300 mL/hr at 07/30/15 2039  . dialysis replacement fluid (prismasate) 350 mL/hr at 07/30/15 1526  . dialysate (PRISMASATE) 1,500 mL/hr at 07/30/15 1839     Lab Results  Component Value Date   WBC 13.7* 07/30/2015   HGB 8.0* 07/30/2015   HCT 24.4* 07/30/2015   PLT 115* 07/30/2015   GLUCOSE 119* 07/30/2015   CHOL 88 07/06/2015   TRIG 258* 07/26/2015   HDL 26* 07/06/2015   LDLCALC 25 07/06/2015   ALT 92* 07/30/2015   AST 179* 07/30/2015   NA 133* 07/30/2015   K 4.6 07/30/2015   CL 103 07/30/2015   CREATININE 2.12* 07/30/2015   BUN 49* 07/30/2015   CO2 21* 07/30/2015   TSH 2.026 11/04/2014   PSA <0.1 02/20/2013   INR 1.32 07/27/2015   HGBA1C 6.9* 06/28/2015    still requiring pressors, weaning vent some    Grace Isaac MD  Beeper  [phone] Office [phone] 07/30/2015 9:04 PM

## 2015-07-30 NOTE — Progress Notes (Signed)
Orthopedic Tech Progress Note Patient Details:  Joseph [name] 07/23/1945 [id number] Applied bilateral ankle contracture boots. Ortho Devices Type of Ortho Device: Other (comment) Ortho Device/Splint Location: Bilateral  Ankle Contracture Boots Ortho Device/Splint Interventions: Application   Darrol Poke 07/30/2015, 4:34 PM

## 2015-07-30 NOTE — Progress Notes (Signed)
7 Days Post-Op Procedure(s) (LRB): STERNAL CLOSURE WITH PUMP STANDBY (N/A) TRANSESOPHAGEAL ECHOCARDIOGRAM (TEE) (N/A) Subjective: Status post high risk redo CABG for unstable angina and 95% stenosis of left main stent Preoperative history of chronic renal failure on HD,  thrombocytopenia with coagulopathy, ischemic  cardiomyopathy, COPD, hepatic fibrosis  Status post tracheostomy for prolonged ventilator dependence Postoperative hepatic insufficiency and severe jaundice from underlying hepatic fibrosis and massive transfusion requirements for surgery-coagulopathy  Postoperative CRT placed via temporary dialysis catheter, new catheter placed in right subclavian vein June 23  Postpyloric feeding tube placed by IR under fluoroscopy now on Nepro tube feeds  Patient with improved mental status and at times purposeful and responsive. Inotropes including epinephrine and norepinephrine have been weaned but not stopped.  Patient remains in a sinus rhythm or atrially paced. Surgical incisions remain clean and dry.  Objective: Vital signs in last 24 hours: Temp:  [98.4 F (36.9 C)-99.1 F (37.3 C)] 98.5 F (36.9 C) (06/23 1559) Pulse Rate:  [69-99] 80 (06/23 1630) Cardiac Rhythm:  [-] Normal sinus rhythm (06/23 1200) Resp:  [14-41] 30 (06/23 1630) BP: (75-131)/(37-70) 105/46 mmHg (06/23 1555) SpO2:  [100 %] 100 % (06/23 1630) Arterial Line BP: (66-192)/(30-61) 90/39 mmHg (06/23 1630) FiO2 (%):  [40 %] 40 % (06/23 1555) Weight:  [219 lb 9.3 oz (99.6 kg)] 219 lb 9.3 oz (99.6 kg) (06/23 0500)  Hemodynamic parameters for last 24 hours: CVP:  [10 mmHg-27 mmHg] 10 mmHg  Intake/Output from previous day: 06/22 0701 - 06/23 0700 In: 2899.2 [I.V.:2004.2; NG/GT:470; IV Piggyback:425] Out: 2987 [Chest Tube:170] Intake/Output this shift: Total I/O In: 1526.5 [I.V.:884.5; NG/GT:342; IV Piggyback:300] Out: 1308 [Other:1288; Chest Tube:20]  Severely jaundiced Scattered rhonchi No  murmur Sternum stable Abdomen soft Extremities warm  Lab Results:  Recent Labs  07/29/15 0355 07/30/15 0403  WBC 19.7* 13.7*  HGB 8.4* 8.0*  HCT 25.4* 24.4*  PLT 108* 115*   BMET:  Recent Labs  07/30/15 0403 07/30/15 1559  NA 134* 133*  K 4.6 4.6  CL 104 103  CO2 23 21*  GLUCOSE 117* 119*  BUN 48* 49*  CREATININE 2.15* 2.12*  CALCIUM 8.5* 8.2*    PT/INR: No results for input(s): LABPROT, INR in the last 72 hours. ABG    Component Value Date/Time   PHART 7.377 07/30/2015 1741   HCO3 26.8* 07/30/2015 1741   TCO2 28 07/30/2015 1741   ACIDBASEDEF 2.0 07/30/2015 0414   O2SAT 100.0 07/30/2015 1741   CBG (last 3)   Recent Labs  07/30/15 0740 07/30/15 1212 07/30/15 1557  GLUCAP 119* 162* 105*    Assessment/Plan: S/P Procedure(s) (LRB): STERNAL CLOSURE WITH PUMP STANDBY (N/A) TRANSESOPHAGEAL ECHOCARDIOGRAM (TEE) (N/A) Patient tolerated pressure support weaning today but not trach collar We'll remove groin HD catheter after placement of new right subclavian vein try analysis catheter Continue tube feeds and advanced to 40 cc/h Nepro Platelet count has been greater than 100,000 the past 3 days, significant improvement IV vancomycin and imipenem for 10 days after chest closure   LOS: 24 days    Joseph [name] 07/30/2015

## 2015-07-30 NOTE — Progress Notes (Signed)
eLink Physician-Brief Progress Note Patient Name: Joseph [name] DOB: March 22, 1945 MRN: [id number]   Date of Service  07/30/2015  HPI/Events of Note  Camera check on patient with respiratory failure and ongoing shock. Bedside nurse reports patient having loose bowel movements resembling tube feeds. Increased agitation requiring increased sedation. Holding on further diuresis with CVVHD. Vasopressor support increased as well. Increased work of breathing with respiratory rate 26 on pressure support 10/5.   eICU Interventions  1. Switching patient back to full vent support with PRVC 2. Continue current plan of care      Intervention Category Major Interventions: Shock - evaluation and management;Respiratory failure - evaluation and management  Tera Partridge 07/30/2015, 11:43 PM

## 2015-07-30 NOTE — Progress Notes (Signed)
   07/30/15 1000  Clinical Encounter Type  Visited With Patient and family together  Visit Type Spiritual support  Referral From Nurse  Spiritual Encounters  Spiritual Needs Prayer;Emotional  Stress Factors  Family Stress Factors Health changes;Exhausted  Chaplain introduced self to daughter and briefly discussed patient's status and the recent signs of progress. Patient's own pastor has not been visiting due to a desire to keep number of visitors down. Chaplain prayed with daughter April and patient and nurse. Kirin Brandenburger, Chaplain

## 2015-07-30 NOTE — Progress Notes (Signed)
Pharmacy Antibiotic Note  Joseph [name] is a 70 y.o. male s/p redo CABG on 6/9 who continues on vancomycin and Primaxin s/p open sternum and closure.    Continues on CRRT today. Random vancomycin level checked this morning resulted at 22 mcg/dl. Nurse reports that patient hasn't required long breaks from CRRT. Will provide a dose of vancomycin today and check a 24h random level tomorrow.  Plan: Vancomycin 1g IV once Continue Primaxin 500mg  IV q8h Monitor culture data, CRRT plans and clinical course 24h VR   Height: 5\' 11"  (180.3 cm) Weight: 219 lb 9.3 oz (99.6 kg) IBW/kg (Calculated) : 75.3  Temp (24hrs), Avg:98.7 F (37.1 C), Min:98.1 F (36.7 C), Max:99.1 F (37.3 C)   Recent Labs Lab 07/25/15 1223  07/26/15 0453  07/27/15 0310  07/28/15 0255 07/28/15 1118 07/28/15 1600 07/29/15 0355 07/29/15 1400 07/29/15 1600 07/30/15 0403 07/30/15 0440  WBC  --   --  26.2*  --  22.7*  --   --  21.2*  --  19.7*  --   --  13.7*  --   CREATININE  --   < > 2.16*  < > 2.05*  < > 2.08*  --  2.17* 2.05*  2.09*  --  2.24* 2.15*  --   VANCOTROUGH  --   --   --   --   --   --   --   --  22*  --  39*  --   --   --   VANCORANDOM 21  --   --   --   --   --   --   --   --   --   --   --   --  22  < > = values in this interval not displayed.  Estimated Creatinine Clearance: 40.1 mL/min (by C-G formula based on Cr of 2.15).    Allergies  Allergen Reactions  . Sulfa Antibiotics Hives and Itching  . Morphine And Related     Makes patient feel weird    Antimicrobials this admission: 6/8 Vanc>> 6/9 Zosyn>>6/17 6/17 Primaxin>>  Dose adjustments this admission: 6/10 AM VR (CVTS ordered): 26 (only received 2 doses, CRRT off x 10 hours when level drawn) 6/11 AM VR (CVTS): 14 (no dose 6/10) - start 1250mg  q24 6/14 VT = 25 on 1250mg  q24 with CRRT (drawn ~ 1 hour early) 6/17 VR: 35 after vancomycin 1g 6/16 at 1135 (hold and recheck tomorrow AM) 6/18 VR: 21 after holding vancomycin 6/21 VT:  22 (drawn 2 hours early) 6/22 VR: 39 drawn 4 hours early 6/23: VR: 22 after holding vancomycin  Microbiology results: 6/10 TA: neg 6/7 MRSA PCR: neg  Andrey Cota. Diona Foley, PharmD, BCPS Clinical Pharmacist Pager [phone]  07/30/2015 8:12 AM

## 2015-07-31 ENCOUNTER — Inpatient Hospital Stay (HOSPITAL_COMMUNITY): Payer: Medicare Other

## 2015-07-31 LAB — CBC
HCT: 24.3 % — ABNORMAL LOW (ref 39.0–52.0)
Hemoglobin: 7.9 g/dL — ABNORMAL LOW (ref 13.0–17.0)
MCH: 28.9 pg (ref 26.0–34.0)
MCHC: 32.5 g/dL (ref 30.0–36.0)
MCV: 89 fL (ref 78.0–100.0)
Platelets: 136 10*3/uL — ABNORMAL LOW (ref 150–400)
RBC: 2.73 MIL/uL — ABNORMAL LOW (ref 4.22–5.81)
RDW: 25.3 % — ABNORMAL HIGH (ref 11.5–15.5)
WBC: 11.7 10*3/uL — ABNORMAL HIGH (ref 4.0–10.5)

## 2015-07-31 LAB — MAGNESIUM: Magnesium: 2.6 mg/dL — ABNORMAL HIGH (ref 1.7–2.4)

## 2015-07-31 LAB — COMPREHENSIVE METABOLIC PANEL
ALT: 87 U/L — ABNORMAL HIGH (ref 17–63)
AST: 200 U/L — ABNORMAL HIGH (ref 15–41)
Albumin: 1.9 g/dL — ABNORMAL LOW (ref 3.5–5.0)
Alkaline Phosphatase: 647 U/L — ABNORMAL HIGH (ref 38–126)
Anion gap: 10 (ref 5–15)
BUN: 49 mg/dL — ABNORMAL HIGH (ref 6–20)
CO2: 22 mmol/L (ref 22–32)
Calcium: 8.2 mg/dL — ABNORMAL LOW (ref 8.9–10.3)
Chloride: 101 mmol/L (ref 101–111)
Creatinine, Ser: 2.04 mg/dL — ABNORMAL HIGH (ref 0.61–1.24)
GFR calc Af Amer: 37 mL/min — ABNORMAL LOW (ref >60)
GFR calc non Af Amer: 32 mL/min — ABNORMAL LOW (ref >60)
Glucose, Bld: 115 mg/dL — ABNORMAL HIGH (ref 65–99)
Potassium: 4.5 mmol/L (ref 3.5–5.1)
Sodium: 133 mmol/L — ABNORMAL LOW (ref 135–145)
Total Bilirubin: 45.2 mg/dL (ref 0.3–1.2)
Total Protein: 5.3 g/dL — ABNORMAL LOW (ref 6.5–8.1)

## 2015-07-31 LAB — RENAL FUNCTION PANEL
ALBUMIN: 1.7 g/dL — AB (ref 3.5–5.0)
ANION GAP: 9 (ref 5–15)
BUN: 54 mg/dL — ABNORMAL HIGH (ref 6–20)
CO2: 22 mmol/L (ref 22–32)
Calcium: 8 mg/dL — ABNORMAL LOW (ref 8.9–10.3)
Chloride: 102 mmol/L (ref 101–111)
Creatinine, Ser: 2.2 mg/dL — ABNORMAL HIGH (ref 0.61–1.24)
GFR, EST AFRICAN AMERICAN: 34 mL/min — AB (ref >60)
GFR, EST NON AFRICAN AMERICAN: 29 mL/min — AB (ref >60)
Glucose, Bld: 155 mg/dL — ABNORMAL HIGH (ref 65–99)
PHOSPHORUS: 5.2 mg/dL — AB (ref 2.5–4.6)
POTASSIUM: 4.3 mmol/L (ref 3.5–5.1)
Sodium: 133 mmol/L — ABNORMAL LOW (ref 135–145)

## 2015-07-31 LAB — GLUCOSE, CAPILLARY
GLUCOSE-CAPILLARY: 112 mg/dL — AB (ref 65–99)
GLUCOSE-CAPILLARY: 138 mg/dL — AB (ref 65–99)
Glucose-Capillary: 135 mg/dL — ABNORMAL HIGH (ref 65–99)
Glucose-Capillary: 140 mg/dL — ABNORMAL HIGH (ref 65–99)
Glucose-Capillary: 155 mg/dL — ABNORMAL HIGH (ref 65–99)
Glucose-Capillary: 156 mg/dL — ABNORMAL HIGH (ref 65–99)

## 2015-07-31 LAB — POCT I-STAT 3, ART BLOOD GAS (G3+)
Acid-Base Excess: 2 mmol/L (ref 0.0–2.0)
BICARBONATE: 25 meq/L — AB (ref 20.0–24.0)
O2 Saturation: 98 %
PCO2 ART: 33.4 mmHg — AB (ref 35.0–45.0)
PH ART: 7.484 — AB (ref 7.350–7.450)
PO2 ART: 92 mmHg (ref 80.0–100.0)
Patient temperature: 99.2
TCO2: 26 mmol/L (ref 0–100)

## 2015-07-31 LAB — PHOSPHORUS: PHOSPHORUS: 5.1 mg/dL — AB (ref 2.5–4.6)

## 2015-07-31 LAB — CARBOXYHEMOGLOBIN
Carboxyhemoglobin: 1.9 % — ABNORMAL HIGH (ref 0.5–1.5)
Carboxyhemoglobin: 1.8 % — ABNORMAL HIGH (ref 0.5–1.5)
Methemoglobin: 0.4 % (ref 0.0–1.5)
Methemoglobin: 0.4 % (ref 0.0–1.5)
O2 Saturation: 64.8 %
O2 Saturation: 64.8 %
Total hemoglobin: 8.1 g/dL — ABNORMAL LOW (ref 13.5–18.0)
Total hemoglobin: 8.5 g/dL — ABNORMAL LOW (ref 13.5–18.0)

## 2015-07-31 LAB — PREPARE RBC (CROSSMATCH)

## 2015-07-31 LAB — CALCIUM, IONIZED: Calcium, Ionized, Serum: 4.9 mg/dL (ref 4.5–5.6)

## 2015-07-31 LAB — VANCOMYCIN, RANDOM: Vancomycin Rm: 21 ug/mL

## 2015-07-31 MED ORDER — NEPRO/CARBSTEADY PO LIQD
1000.0000 mL | ORAL | Status: DC
  Administered 2015-08-01 – 2015-08-07 (×7): 1000 mL
  Filled 2015-07-31 (×14): qty 1000

## 2015-07-31 MED ORDER — VANCOMYCIN HCL IN DEXTROSE 1-5 GM/200ML-% IV SOLN
1000.0000 mg | Freq: Once | INTRAVENOUS | Status: AC
  Administered 2015-07-31: 1000 mg via INTRAVENOUS
  Filled 2015-07-31: qty 200

## 2015-07-31 NOTE — Progress Notes (Signed)
CRITICAL VALUE ALERT  Critical value received:  Respiratory culture  Date of notification: 07/31/15  Time of notification:  [id number]  Critical value read back: yes  Nurse who received alert:  Tamera Punt  MD notified (1st page):  Dr. Lamonte Sakai  Time of first page:  1237  MD notified (2nd page):  Time of second page:  Responding MD:  Dr. Lamonte Sakai  Time MD responded:  [phone]

## 2015-07-31 NOTE — Progress Notes (Signed)
Patient ID: Joseph [name], male   DOB: Sep 23, 1945, 70 y.o.   MRN: [id number] EVENING ROUNDS NOTE :     [address]       [place] [address]             [phone]                 8 Days Post-Op Procedure(s) (LRB): STERNAL CLOSURE WITH PUMP STANDBY (N/A) TRANSESOPHAGEAL ECHOCARDIOGRAM (TEE) (N/A)  Total Length of Stay:  LOS: 25 days  BP 130/39 mmHg  Pulse 87  Temp(Src) 99.6 F (37.6 C) (Axillary)  Resp 37  Ht 5\' 11"  (1.803 m)  Wt 216 lb 1.6 oz (98.022 kg)  BMI 30.15 kg/m2  SpO2 100%  .Intake/Output      06/24 0701 - 06/25 0700   I.V. (mL/kg) 921.9 (9.4)   Blood 286   Other 30   NG/GT 280.8   IV Piggyback 500   Total Intake(mL/kg) 2018.7 (20.6)   Other 1696   Stool 350   Chest Tube 40   Total Output 2086   Net -67.3         . sodium chloride 20 mL/hr at 07/31/15 1835  . EPINEPHrine 4 mg in dextrose 5% 250 mL infusion (16 mcg/mL) 6 mcg/min (07/31/15 1834)  . fentaNYL infusion INTRAVENOUS 25 mcg/hr (07/31/15 1840)  . midazolam (VERSED) infusion Stopped (07/26/15 0852)  . milrinone 0.125 mcg/kg/min (07/31/15 1835)  . norepinephrine (LEVOPHED) Adult infusion 30 mcg/min (07/31/15 1834)  . dialysis replacement fluid (prismasate) 300 mL/hr at 07/31/15 1512  . dialysis replacement fluid (prismasate) 350 mL/hr at 07/31/15 0509  . dialysate (PRISMASATE) 1,500 mL/hr at 07/31/15 1511     Lab Results  Component Value Date   WBC 11.7* 07/31/2015   HGB 7.9* 07/31/2015   HCT 24.3* 07/31/2015   PLT 136* 07/31/2015   GLUCOSE 155* 07/31/2015   CHOL 88 07/06/2015   TRIG 258* 07/26/2015   HDL 26* 07/06/2015   LDLCALC 25 07/06/2015   ALT 87* 07/31/2015   AST 200* 07/31/2015   NA 133* 07/31/2015   K 4.3 07/31/2015   CL 102 07/31/2015   CREATININE 2.20* 07/31/2015   BUN 54* 07/31/2015   CO2 22 07/31/2015   TSH 2.026 11/04/2014   PSA <0.1 02/20/2013   INR 1.32 07/27/2015   HGBA1C 6.9* 06/23/2015   Epi down slightly, tolerating increased tube  feeding   Grace Isaac MD  Beeper [phone] Office [phone] 07/31/2015 7:31 PM

## 2015-07-31 NOTE — Progress Notes (Signed)
Pharmacy Antibiotic Note  Joseph [name] is a 70 y.o. male s/p redo CABG on 6/9 who continues on vancomycin and Primaxin s/p open sternum and closure.    Continues on CRRT today. Random vancomycin level checked this morning resulted at 21 mcg/dl very close to goal. Will give another dose of vanc this afternoon and follow up in am.  Plan: Vancomycin 1g IV once this afternoon Continue Primaxin 500mg  IV q8h Monitor culture data, CRRT plans and clinical course 24h VR Abx stop date of 6/27   Height: 5\' 11"  (180.3 cm) Weight: 216 lb 1.6 oz (98.022 kg) IBW/kg (Calculated) : 75.3  Temp (24hrs), Avg:98.9 F (37.2 C), Min:98.4 F (36.9 C), Max:99.2 F (37.3 C)   Recent Labs Lab 07/27/15 0310  07/28/15 1118 07/28/15 1600 07/29/15 0355 07/29/15 1400 07/29/15 1600 07/30/15 0403 07/30/15 0440 07/30/15 1559 07/31/15 0333 07/31/15 1030  WBC 22.7*  --  21.2*  --  19.7*  --   --  13.7*  --   --  11.7*  --   CREATININE 2.05*  < >  --  2.17* 2.05*  2.09*  --  2.24* 2.15*  --  2.12* 2.04*  --   VANCOTROUGH  --   --   --  22*  --  39*  --   --   --   --   --   --   VANCORANDOM  --   --   --   --   --   --   --   --  22  --   --  21  < > = values in this interval not displayed.  Estimated Creatinine Clearance: 41.9 mL/min (by C-G formula based on Cr of 2.04).    Allergies  Allergen Reactions  . Sulfa Antibiotics Hives and Itching  . Morphine And Related     Makes patient feel weird    Antimicrobials this admission: 6/8 Vanc>> 6/9 Zosyn>>6/17 6/17 Primaxin>>  Dose adjustments this admission: 6/10 AM VR (CVTS ordered): 26 (only received 2 doses, CRRT off x 10 hours when level drawn) 6/11 AM VR (CVTS): 14 (no dose 6/10) - start 1250mg  q24 6/14 VT = 25 on 1250mg  q24 with CRRT (drawn ~ 1 hour early) 6/17 VR: 35 after vancomycin 1g 6/16 at 1135 (hold and recheck tomorrow AM) 6/18 VR: 21 after holding vancomycin 6/21 VT: 22 (drawn 2 hours early) 6/22 VR: 39 drawn 4 hours  early 6/23: VR: 22 after holding vancomycin 6/24: VR:  21 - give 1g later today ~30hrs from last dose  Microbiology results: 6/10 TA: neg 6/7 MRSA PCR: neg  Erin Hearing PharmD., BCPS Clinical Pharmacist Pager [phone] 07/31/2015 11:45 AM

## 2015-07-31 NOTE — Progress Notes (Signed)
70 y.o. male with ESRD who was admitted 06/14/2015 with CP and subsequent redo CABG complicated by blood loss req transfusion and hemodynamic instability -s/p balloon pump, VDRF, shock, CRRT Assessment/Plan: 1. CAD- complicated redo CABG- on pressors 2. ESRD -TTS [facility]-on CRRT now since 6/17. Has left forearm AVF -Increased fluids pre filter fluid to prevent clotting. CRRT same Rx 3. Anemia- has required transfusions-  4. HTN/volume- CRRT with volume removal 5. Inc LFTs/hyperbilirubinemia  6. Alkalosis, improved off citrate, problematic usage with liver disease  Subjective: Interval History: No change  Objective: Vital signs in last 24 hours: Temp:  [98.5 F (36.9 C)-100 F (37.8 C)] 100 F (37.8 C) (06/24 1225) Pulse Rate:  [56-91] 80 (06/24 1400) Resp:  [17-44] 22 (06/24 1400) BP: (100-105)/(39-46) 100/39 mmHg (06/24 1111) SpO2:  [100 %] 100 % (06/24 1400) Arterial Line BP: (69-163)/(32-55) 114/40 mmHg (06/24 1400) FiO2 (%):  [30 %-40 %] 40 % (06/24 1200) Weight:  [98.022 kg (216 lb 1.6 oz)] 98.022 kg (216 lb 1.6 oz) (06/24 0545) Weight change: -1.578 kg (-3 lb 7.7 oz)  Intake/Output from previous day: 06/23 0701 - 06/24 0700 In: 3937.1 [I.V.:2205.1; NG/GT:1232; IV Piggyback:500] Out: 0630 [Stool:550; Chest Tube:80] Intake/Output this shift: Total I/O In: 1360.2 [I.V.:493.4; Blood:286; NG/GT:280.8; IV Piggyback:300] Out: 1136 [Other:1116; Chest Tube:20]  General appearance: unresponsive toady Neck: trach Skin: jaundiced  Lab Results:  Recent Labs  07/30/15 0403 07/31/15 0333  WBC 13.7* 11.7*  HGB 8.0* 7.9*  HCT 24.4* 24.3*  PLT 115* 136*   BMET:  Recent Labs  07/30/15 1559 07/31/15 0333  NA 133* 133*  K 4.6 4.5  CL 103 101  CO2 21* 22  GLUCOSE 119* 115*  BUN 49* 49*  CREATININE 2.12* 2.04*  CALCIUM 8.2* 8.2*   No results for input(s): PTH in the last 72 hours. Iron Studies: No results for input(s): IRON, TIBC, TRANSFERRIN, FERRITIN in  the last 72 hours. Studies/Results: Dg Chest Port 1 View  07/31/2015  CLINICAL DATA:  Status post CABG x3. EXAM: PORTABLE CHEST 1 VIEW COMPARISON:  Chest x-ray dated 07/30/2015. FINDINGS: Cardiomegaly is stable. Median sternotomy wires in place. Left subclavian central line is stable in position with tip at the level of the mid SVC. Right subclavian catheter is stable in position with tip at the level of the upper SVC. Enteric tube passes below the diaphragm. Left-sided chest tube stable in position at the left lung base. Tracheostomy remains well position. There is mild central pulmonary vascular congestion and mild interstitial edema, without overt alveolar pulmonary edema. No pleural effusion or pneumothorax seen. IMPRESSION: 1. Postsurgical changes as above. Support apparatus appears appropriately positioned. 2. Mild central pulmonary vascular congestion and mild interstitial edema without overt alveolar pulmonary edema. 3. Cardiomegaly, stable. Electronically Signed   By: Franki Cabot M.D.   On: 07/31/2015 09:53   Dg Chest Port 1 View  07/30/2015  CLINICAL DATA:  Encounter for central line placement EXAM: PORTABLE CHEST - 1 VIEW COMPARISON:  Earlier film of the same day FINDINGS: Right subclavian dialysis scratch the right subclavian central venous catheter some placed to the proximal SVC. No pneumothorax. Stable position of tracheostomy device, feeding tube, left subclavian central line. Previous CABG. Mild cardiomegaly. Left chest tube stable in position, with no pneumothorax. No definite effusion. Patchy atelectasis in the lung bases, slightly improved since previous exam. IMPRESSION: 1. Interval placement of right subclavian central venous catheter, tip in the proximal SVC, no pneumothorax. 2.  Support hardware stable in position. 3.  Patchy bibasilar atelectasis, slightly improved since earlier exam. Electronically Signed   By: Lucrezia Europe M.D.   On: 07/30/2015 15:20   Dg Chest Port 1  View  07/30/2015  CLINICAL DATA:  Ventilator dependence. EXAM: PORTABLE CHEST 1 VIEW COMPARISON:  07/29/2015 FINDINGS: 0554 hours. Tracheostomy tube remains in place. A feeding tube passes into the stomach although the distal tip position is not included on the film. Left chest tube again noted without substantial change. Left subclavian central line tip overlies the mid to distal SVC level. Lung volumes remain low with vascular congestion and basilar atelectasis. No pneumothorax. The cardio pericardial silhouette is enlarged. Patient is status post median sternotomy. Telemetry leads overlie the chest. IMPRESSION: No substantial change. Vascular congestion with basilar atelectasis. Electronically Signed   By: Misty Stanley M.D.   On: 07/30/2015 08:12    Scheduled: . sodium chloride   Intravenous Once  . antiseptic oral rinse  7 mL Mouth Rinse 10 times per day  . bisacodyl  10 mg Oral Daily   Or  . bisacodyl  10 mg Rectal Daily  . chlorhexidine gluconate (SAGE KIT)  15 mL Mouth Rinse BID  . darbepoetin (ARANESP) injection - NON-DIALYSIS  100 mcg Subcutaneous Q Fri-1800  . docusate sodium  200 mg Oral Daily  . feeding supplement (NEPRO CARB STEADY)  1,000 mL Per Tube Q24H  . feeding supplement (PRO-STAT SUGAR FREE 64)  60 mL Per Tube QID  . fentaNYL (SUBLIMAZE) injection  200 mcg Intravenous Once  . imipenem-cilastatin  500 mg Intravenous Q8H  . insulin aspart  0-9 Units Subcutaneous Q4H  . insulin glargine  25 Units Subcutaneous BID  . levalbuterol  1.25 mg Nebulization Q6H  . metoCLOPramide (REGLAN) injection  10 mg Intravenous Q6H  . pantoprazole (PROTONIX) IV  40 mg Intravenous Q24H  . propofol  50 mL Intravenous Once  . QUEtiapine  50 mg Oral BID  . vancomycin  1,000 mg Intravenous Once    LOS: 25 days   Orlando Thalmann C 07/31/2015,2:37 PM

## 2015-07-31 NOTE — Progress Notes (Signed)
Patient ID: Joseph [name], male   DOB: 1945-11-30, 70 y.o.   MRN: [id number] TCTS DAILY ICU PROGRESS NOTE                   [address]            [place] [id number]          [phone]   8 Days Post-Op Procedure(s) (LRB): STERNAL CLOSURE WITH PUMP STANDBY (N/A) TRANSESOPHAGEAL ECHOCARDIOGRAM (TEE) (N/A)  Total Length of Stay:  LOS: 25 days   Subjective: Sedated on vent, very jaundiced  Preoperative history of chronic renal failure on HD, thrombocytopenia with coagulopathy, ischemic cardiomyopathy, COPD, hepatic fibrosis  Objective: Vital signs in last 24 hours: Temp:  [98.4 F (36.9 C)-99.2 F (37.3 C)] 99.2 F (37.3 C) (06/24 0357) Pulse Rate:  [56-92] 84 (06/24 0715) Cardiac Rhythm:  [-] Normal sinus rhythm (06/24 0630) Resp:  [17-43] 30 (06/24 0715) BP: (91-105)/(37-46) 105/46 mmHg (06/23 1555) SpO2:  [100 %] 100 % (06/24 0715) Arterial Line BP: (66-170)/(32-55) 115/44 mmHg (06/24 0715) FiO2 (%):  [30 %-40 %] 40 % (06/24 0419) Weight:  [216 lb 1.6 oz (98.022 kg)] 216 lb 1.6 oz (98.022 kg) (06/24 0545)  Filed Weights   07/29/15 0500 07/30/15 0500 07/31/15 0545  Weight: 223 lb 15.8 oz (101.6 kg) 219 lb 9.3 oz (99.6 kg) 216 lb 1.6 oz (98.022 kg)    Weight change: -3 lb 7.7 oz (-1.578 kg)   Hemodynamic parameters for last 24 hours: CVP:  [2 mmHg-23 mmHg] 15 mmHg  Intake/Output from previous day: 06/23 0701 - 06/24 0700 In: 3937.1 [I.V.:2205.1; NG/GT:1232; IV Piggyback:500] Out: 2130 [Stool:300; Chest Tube:80]  Intake/Output this shift:    Current Meds: Scheduled Meds: . sodium chloride   Intravenous Once  . antiseptic oral rinse  7 mL Mouth Rinse 10 times per day  . bisacodyl  10 mg Oral Daily   Or  . bisacodyl  10 mg Rectal Daily  . chlorhexidine gluconate (SAGE KIT)  15 mL Mouth Rinse BID  . darbepoetin (ARANESP) injection - NON-DIALYSIS  100 mcg Subcutaneous Q Fri-1800  . docusate sodium  200 mg Oral Daily  . feeding supplement  (NEPRO CARB STEADY)  1,000 mL Per Tube Q24H  . feeding supplement (PRO-STAT SUGAR FREE 64)  60 mL Per Tube QID  . fentaNYL (SUBLIMAZE) injection  200 mcg Intravenous Once  . imipenem-cilastatin  500 mg Intravenous Q8H  . insulin aspart  0-9 Units Subcutaneous Q4H  . insulin glargine  25 Units Subcutaneous BID  . levalbuterol  1.25 mg Nebulization Q6H  . metoCLOPramide (REGLAN) injection  10 mg Intravenous Q6H  . pantoprazole (PROTONIX) IV  40 mg Intravenous Q24H  . propofol  50 mL Intravenous Once  . QUEtiapine  50 mg Oral BID   Continuous Infusions: . sodium chloride 20 mL/hr at 07/31/15 0700  . EPINEPHrine 4 mg in dextrose 5% 250 mL infusion (16 mcg/mL) 8 mcg/min (07/31/15 0700)  . fentaNYL infusion INTRAVENOUS 50 mcg/hr (07/31/15 0700)  . midazolam (VERSED) infusion Stopped (07/26/15 0852)  . milrinone 0.125 mcg/kg/min (07/31/15 0700)  . norepinephrine (LEVOPHED) Adult infusion 32.96 mcg/min (07/31/15 0700)  . dialysis replacement fluid (prismasate) 300 mL/hr at 07/30/15 2039  . dialysis replacement fluid (prismasate) 350 mL/hr at 07/31/15 0509  . dialysate (PRISMASATE) 1,500 mL/hr at 07/31/15 0439   PRN Meds:.fentaNYL, fentaNYL (SUBLIMAZE) injection, heparin, metoprolol, midazolam, ondansetron (ZOFRAN) IV, sodium chloride, sodium chloride flush, traMADol  General appearance: slowed mentation  and opens eyes and follows some commands  Neurologic: hard to fully evaluate but no laterlizing signs Heart: paced Lungs: diminished breath sounds bilaterally Abdomen: mildly distended  Extremities: edema bilaterial Wound: intact  Lab Results: CBC: Recent Labs  07/30/15 0403 07/31/15 0333  WBC 13.7* 11.7*  HGB 8.0* 7.9*  HCT 24.4* 24.3*  PLT 115* 136*   BMET:  Recent Labs  07/30/15 1559 07/31/15 0333  NA 133* 133*  K 4.6 4.5  CL 103 101  CO2 21* 22  GLUCOSE 119* 115*  BUN 49* 49*  CREATININE 2.12* 2.04*  CALCIUM 8.2* 8.2*    PT/INR: No results for input(s): LABPROT,  INR in the last 72 hours. Radiology: Dg Chest Port 1 View  07/30/2015  CLINICAL DATA:  Encounter for central line placement EXAM: PORTABLE CHEST - 1 VIEW COMPARISON:  Earlier film of the same day FINDINGS: Right subclavian dialysis scratch the right subclavian central venous catheter some placed to the proximal SVC. No pneumothorax. Stable position of tracheostomy device, feeding tube, left subclavian central line. Previous CABG. Mild cardiomegaly. Left chest tube stable in position, with no pneumothorax. No definite effusion. Patchy atelectasis in the lung bases, slightly improved since previous exam. IMPRESSION: 1. Interval placement of right subclavian central venous catheter, tip in the proximal SVC, no pneumothorax. 2.  Support hardware stable in position. 3. Patchy bibasilar atelectasis, slightly improved since earlier exam. Electronically Signed   By: Lucrezia Europe M.D.   On: 07/30/2015 15:20     Assessment/Plan: S/P Procedure(s) (LRB): STERNAL CLOSURE WITH PUMP STANDBY (N/A) TRANSESOPHAGEAL ECHOCARDIOGRAM (TEE) (N/A) Continue tube feeds increase to 40 Platelet count has been greater than 100,000 the past 4 days, now 136,000 IV vancomycin and imipenem for 10 days after chest closure- vanco levels elevated dose held     Grace Isaac 07/31/2015 8:03 AM

## 2015-07-31 NOTE — Progress Notes (Signed)
PULMONARY / CRITICAL CARE MEDICINE   Name: Joseph [name] MRN: [id number] DOB: 1945-07-03    ADMISSION DATE:  06/18/2015 CONSULTATION DATE:  07/16/2015  REFERRING MD:  Nils Pyle  CHIEF COMPLAINT:  Post cardiothoracic surgery ventilator management  HISTORY OF PRESENT ILLNESS:   70 y/o male with ESRD and significant CAD (CABG 2001, DES 2016, EF 40-45%) was admitted on 5/29 with NSTEMI and had persistent chest pain during his hospitalization so he was taken for a redo CABG on 6/8 (SVG to diag and SVG to OM, IABP placement and CVL placement).  Post operatively he had excessive bleeding from all chest drains (pleural and mediastinal) so he was taken back to the OR on 6/9 for re-exploration.  PCCM was consulted for ventilator management.    SUBJECTIVE:   Diarrhea o/n Pressors > norepi 33, epi 8 CVVHD running He did PSV until about 11pm 6/23   VITAL SIGNS: BP 105/46 mmHg  Pulse 84  Temp(Src) 99.2 F (37.3 C) (Axillary)  Resp 30  Ht 5\' 11"  (1.803 m)  Wt 98.022 kg (216 lb 1.6 oz)  BMI 30.15 kg/m2  SpO2 100%  HEMODYNAMICS: CVP:  [2 mmHg-23 mmHg] 15 mmHg  VENTILATOR SETTINGS: Vent Mode:  [-] PRVC FiO2 (%):  [30 %-40 %] 40 % Set Rate:  [16 bmp] 16 bmp Vt Set:  [500 mL] 500 mL PEEP:  [5 cmH20] 5 cmH20 Pressure Support:  [5 cmH20-10 cmH20] 10 cmH20  INTAKE / OUTPUT: I/O last 3 completed shifts: In: [id number] [I.V.:3076; MD:6327369; IV Piggyback:700] Out: [id number] [Other:5019; Stool:300; Chest Tube:180]  PHYSICAL EXAMINATION: General:  Critically ill on vent, CVVHD, jaundiced. Icteric.  Neuro:  Sedate. Nodded to voice, no other movement HEENT:  Scleral icterus. ETT in place Cardiovascular:  median sternotomy dressing dr. Kermit Balo s1/s2. (-) s3/m/r/g Lungs:  Fair ae. Rhonchi B  Abdomen:  Distended, some focal superficial bruising, dec BS Musculoskeletal:  No bony abnormalitiies Skin:  Bruising all over extensor surfaces and belly, Gr 1 edema  LABS:  BMET  Recent Labs Lab  07/30/15 0403 07/30/15 1559 07/31/15 0333  NA 134* 133* 133*  K 4.6 4.6 4.5  CL 104 103 101  CO2 23 21* 22  BUN 48* 49* 49*  CREATININE 2.15* 2.12* 2.04*  GLUCOSE 117* 119* 115*    Electrolytes  Recent Labs Lab 07/29/15 0355  07/30/15 0403 07/30/15 1559 07/31/15 0333 07/31/15 0400  CALCIUM 8.0*  8.0*  < > 8.5* 8.2* 8.2*  --   MG 2.2  --  2.7*  --  2.6*  --   PHOS 6.1*  < > 5.7* 5.3*  --  5.1*  < > = values in this interval not displayed.  CBC  Recent Labs Lab 07/29/15 0355 07/30/15 0403 07/31/15 0333  WBC 19.7* 13.7* 11.7*  HGB 8.4* 8.0* 7.9*  HCT 25.4* 24.4* 24.3*  PLT 108* 115* 136*    Coag's  Recent Labs Lab 07/25/15 0355 07/27/15 0310  APTT  --  30  INR 1.89* 1.32   Sepsis Markers  Recent Labs Lab 07/25/15 0355  PROCALCITON 7.21   ABG  Recent Labs Lab 07/30/15 0414 07/30/15 1741 07/31/15 0340  PHART 7.391 7.377 7.484*  PCO2ART 38.3 45.6* 33.4*  PO2ART 96.0 269.0* 92.0    Liver Enzymes  Recent Labs Lab 07/29/15 0355  07/30/15 0403 07/30/15 1559 07/31/15 0333  AST 87*  --  179*  --  200*  ALT 82*  --  92*  --  87*  ALKPHOS 332*  --  565*  --  647*  BILITOT 40.6*  --  46.5*  --  45.2*  ALBUMIN 2.1*  2.0*  < > 2.1* 1.9* 1.9*  < > = values in this interval not displayed.  Cardiac Enzymes No results for input(s): TROPONINI, PROBNP in the last 168 hours.  Glucose  Recent Labs Lab 07/30/15 0740 07/30/15 1212 07/30/15 1557 07/30/15 1947 07/30/15 2349 07/31/15 0356  GLUCAP 119* 162* 105* 121* 140* 112*    Imaging Dg Chest Port 1 View  07/30/2015  CLINICAL DATA:  Encounter for central line placement EXAM: PORTABLE CHEST - 1 VIEW COMPARISON:  Earlier film of the same day FINDINGS: Right subclavian dialysis scratch the right subclavian central venous catheter some placed to the proximal SVC. No pneumothorax. Stable position of tracheostomy device, feeding tube, left subclavian central line. Previous CABG. Mild cardiomegaly.  Left chest tube stable in position, with no pneumothorax. No definite effusion. Patchy atelectasis in the lung bases, slightly improved since previous exam. IMPRESSION: 1. Interval placement of right subclavian central venous catheter, tip in the proximal SVC, no pneumothorax. 2.  Support hardware stable in position. 3. Patchy bibasilar atelectasis, slightly improved since earlier exam. Electronically Signed   By: Lucrezia Europe M.D.   On: 07/30/2015 15:20   STUDIES:  6/14 TTE >EF 30-35%  CULTURES: Sputum 6/10 >> neg  ANTIBIOTICS: 6/8 Vanc >>6/16; 6/18 >>  6/10 Zosyn >>6/16 Primaxin 6/16>>>  SIGNIFICANT EVENTS: 6/8 Redo CABG 6/8 take back to OR emergently for diffuse bleeding 6/16 back to OR to close sternum  LINES/TUBES: Left forearm fistula 6/8 Multiple chest tubes (mediastinal and pleural bilateral) >> 6/8 CVC left femoral vein >>out 6/8 R radial arterial line >>6/17>>>6/17>>> 6/8 L IJ [place] >>6/17 L [place] TLC 6/16>>> R [facility] HD catheter 6/23 >>  Trach 6/21 >>   DISCUSSION: 70 y/o male s/p redo CABG 6/8 complicated by significant post operative bleeding in the setting of thrombocytopenia brought back to the Surgical ICU on 6/9 with hypoxemic respiratory failure.  He has baseline ESRD and DM2.  ASSESSMENT / PLAN:  PULMONARY A: Acute hypoxemic respiratory failure 2/2  Pulm edema, possible HCAP S/P Trache 6/20 by Hyman Bible Post operative pleural bleeding  P:   Cont vent support. Daily PST as tolerated. Sedation being cut down. Consider making maintenance mode high PS given asynchrony w PRVC PSV trials daily, goal to ATC but has not tolerated yet Inc in secretions per trache > observe for now; (-) fever, (-) inc in WBC Continue CRRT FiO2 40% post op and PEEP 5. Intermittent CXR Chest tubes per thoracic surgery, replaced CT per surgery.  CARDIOVASCULAR A:  Severe Cardiogenic and hemorrhagic shock CAD s/p redo CABG IABP d/c'd on 6/18 P:  Cont levophed and epi drips, wean as  able Tele monitoring. Amiodarone off  RENAL A:   ESRD CKD V P:   Continue CVVHD per renal -- tolerating volume removal on pressors Renal following Replace electrolytes as indicated.  GASTROINTESTINAL A:   Jaundice and coagulopathy due to shocked liver. P:   OG tube TPN per pharmacy due to high residuals. H2 blocker for stress ulcer prophylaxis Abdominal U/S negative for obstruction, likely shocked liver.   HEMATOLOGIC A:   Diffuse oozing due to coagulopathy from blood loss, thrombocytopenia. Improved.  P:  Transfuse per TCTS guidelines. Trend CBC, coags.   INFECTIOUS A:   ?HCAP - doubt, respiratory culture negative P:   Empiric abx primaxin (D8) and vanco (D6) Would d/c abx and follow clinically >  will defer to TCTS  ENDOCRINE A:   DM2   P:   Monitor glucose Insulin gtt per TCTS post op protocol  NEUROLOGIC A:   Sedation needed  for vent synchrony P:   RASS goal: -2 Work to decrease sedation as able.   FAMILY  - Updates:  No family bedside 6/23 - Inter-disciplinary family meet or Palliative Care meeting due by:   Critical care time with this patient today: 31  minutes.    Baltazar Apo, MD, PhD 07/31/2015, 8:06 AM [facility] [phone] or if no answer [phone]

## 2015-08-01 ENCOUNTER — Inpatient Hospital Stay (HOSPITAL_COMMUNITY): Payer: Medicare Other

## 2015-08-01 LAB — GLUCOSE, CAPILLARY
GLUCOSE-CAPILLARY: 110 mg/dL — AB (ref 65–99)
GLUCOSE-CAPILLARY: 146 mg/dL — AB (ref 65–99)
GLUCOSE-CAPILLARY: 160 mg/dL — AB (ref 65–99)
GLUCOSE-CAPILLARY: 161 mg/dL — AB (ref 65–99)
Glucose-Capillary: 113 mg/dL — ABNORMAL HIGH (ref 65–99)
Glucose-Capillary: 136 mg/dL — ABNORMAL HIGH (ref 65–99)
Glucose-Capillary: 140 mg/dL — ABNORMAL HIGH (ref 65–99)

## 2015-08-01 LAB — COMPREHENSIVE METABOLIC PANEL
ALT: 101 U/L — ABNORMAL HIGH (ref 17–63)
AST: 285 U/L — ABNORMAL HIGH (ref 15–41)
Albumin: 1.8 g/dL — ABNORMAL LOW (ref 3.5–5.0)
Alkaline Phosphatase: 877 U/L — ABNORMAL HIGH (ref 38–126)
Anion gap: 9 (ref 5–15)
BUN: 50 mg/dL — ABNORMAL HIGH (ref 6–20)
CO2: 23 mmol/L (ref 22–32)
Calcium: 8.1 mg/dL — ABNORMAL LOW (ref 8.9–10.3)
Chloride: 100 mmol/L — ABNORMAL LOW (ref 101–111)
Creatinine, Ser: 1.95 mg/dL — ABNORMAL HIGH (ref 0.61–1.24)
GFR calc Af Amer: 39 mL/min — ABNORMAL LOW (ref >60)
GFR calc non Af Amer: 34 mL/min — ABNORMAL LOW (ref >60)
Glucose, Bld: 117 mg/dL — ABNORMAL HIGH (ref 65–99)
Potassium: 4.3 mmol/L (ref 3.5–5.1)
Sodium: 132 mmol/L — ABNORMAL LOW (ref 135–145)
Total Bilirubin: 43.4 mg/dL (ref 0.3–1.2)
Total Protein: 5.5 g/dL — ABNORMAL LOW (ref 6.5–8.1)

## 2015-08-01 LAB — POCT I-STAT 3, ART BLOOD GAS (G3+)
ACID-BASE EXCESS: 2 mmol/L (ref 0.0–2.0)
Bicarbonate: 24.9 mEq/L — ABNORMAL HIGH (ref 20.0–24.0)
O2 SAT: 98 %
PCO2 ART: 34.1 mmHg — AB (ref 35.0–45.0)
PO2 ART: 95 mmHg (ref 80.0–100.0)
Patient temperature: 99.2
TCO2: 26 mmol/L (ref 0–100)
pH, Arterial: 7.473 — ABNORMAL HIGH (ref 7.350–7.450)

## 2015-08-01 LAB — RENAL FUNCTION PANEL
ALBUMIN: 1.7 g/dL — AB (ref 3.5–5.0)
ANION GAP: 8 (ref 5–15)
BUN: 48 mg/dL — AB (ref 6–20)
CALCIUM: 8 mg/dL — AB (ref 8.9–10.3)
CO2: 24 mmol/L (ref 22–32)
CREATININE: 1.81 mg/dL — AB (ref 0.61–1.24)
Chloride: 102 mmol/L (ref 101–111)
GFR calc Af Amer: 43 mL/min — ABNORMAL LOW (ref >60)
GFR calc non Af Amer: 37 mL/min — ABNORMAL LOW (ref >60)
GLUCOSE: 140 mg/dL — AB (ref 65–99)
PHOSPHORUS: 4.4 mg/dL (ref 2.5–4.6)
Potassium: 4.3 mmol/L (ref 3.5–5.1)
SODIUM: 134 mmol/L — AB (ref 135–145)

## 2015-08-01 LAB — MAGNESIUM: Magnesium: 2.6 mg/dL — ABNORMAL HIGH (ref 1.7–2.4)

## 2015-08-01 LAB — PHOSPHORUS: Phosphorus: 5.1 mg/dL — ABNORMAL HIGH (ref 2.5–4.6)

## 2015-08-01 LAB — CBC
HCT: 26.7 % — ABNORMAL LOW (ref 39.0–52.0)
Hemoglobin: 8.6 g/dL — ABNORMAL LOW (ref 13.0–17.0)
MCH: 29.2 pg (ref 26.0–34.0)
MCHC: 32.2 g/dL (ref 30.0–36.0)
MCV: 90.5 fL (ref 78.0–100.0)
Platelets: 126 10*3/uL — ABNORMAL LOW (ref 150–400)
RBC: 2.95 MIL/uL — ABNORMAL LOW (ref 4.22–5.81)
RDW: 23.4 % — ABNORMAL HIGH (ref 11.5–15.5)
WBC: 12.4 10*3/uL — ABNORMAL HIGH (ref 4.0–10.5)

## 2015-08-01 LAB — CARBOXYHEMOGLOBIN
Carboxyhemoglobin: 1.7 % — ABNORMAL HIGH (ref 0.5–1.5)
Methemoglobin: 0.5 % (ref 0.0–1.5)
O2 Saturation: 63.7 %
Total hemoglobin: 9 g/dL — ABNORMAL LOW (ref 13.5–18.0)

## 2015-08-01 LAB — CALCIUM, IONIZED: Calcium, Ionized, Serum: 4.8 mg/dL (ref 4.5–5.6)

## 2015-08-01 MED ORDER — VANCOMYCIN HCL IN DEXTROSE 1-5 GM/200ML-% IV SOLN
1000.0000 mg | Freq: Once | INTRAVENOUS | Status: AC
  Administered 2015-08-01: 1000 mg via INTRAVENOUS
  Filled 2015-08-01: qty 200

## 2015-08-01 MED ORDER — SODIUM CHLORIDE 0.9 % IV BOLUS (SEPSIS)
500.0000 mL | Freq: Once | INTRAVENOUS | Status: AC
  Administered 2015-08-01: 500 mL via INTRAVENOUS

## 2015-08-01 NOTE — Progress Notes (Signed)
PULMONARY / CRITICAL CARE MEDICINE   Name: Joseph [name] MRN: [id number] DOB: July 10, 1945    ADMISSION DATE:  06/08/2015 CONSULTATION DATE:  07/16/2015  REFERRING MD:  Nils Pyle  CHIEF COMPLAINT:  Post cardiothoracic surgery ventilator management  HISTORY OF PRESENT ILLNESS:   69 y/o male with ESRD and significant CAD (CABG 2001, DES 2016, EF 40-45%) was admitted on 5/29 with NSTEMI and had persistent chest pain during his hospitalization so he was taken for a redo CABG on 6/8 (SVG to diag and SVG to OM, IABP placement and CVL placement).  Post operatively he had excessive bleeding from all chest drains (pleural and mediastinal) so he was taken back to the OR on 6/9 for re-exploration.  PCCM was consulted for ventilator management.    SUBJECTIVE:   Pressors > norepi up to 40, epi down to 6 CVVHD running He tolerates PSV, is tachypneic no matter what vent mode mode we use Note rare fungus on resp cx   VITAL SIGNS: BP 130/39 mmHg  Pulse 86  Temp(Src) 99.3 F (37.4 C) (Axillary)  Resp 35  Ht 5\' 11"  (1.803 m)  Wt 97.5 kg (214 lb 15.2 oz)  BMI 29.99 kg/m2  SpO2 100%  HEMODYNAMICS: CVP:  [12 mmHg-23 mmHg] 16 mmHg  VENTILATOR SETTINGS: Vent Mode:  [-] PRVC FiO2 (%):  [40 %] 40 % Set Rate:  [16 bmp] 16 bmp Vt Set:  [500 mL] 500 mL PEEP:  [5 cmH20] 5 cmH20 Pressure Support:  [12 cmH20-20 cmH20] 20 cmH20  INTAKE / OUTPUT: I/O last 3 completed shifts: In: 6038.9 [I.V.:2982.1; Blood:286; Other:30; NG/GT:1840.8; IV Piggyback:900] Out: MY:120206 [Other:5187; Stool:1400; Chest Tube:90]  PHYSICAL EXAMINATION: General:  Critically ill on vent, CVVHD, jaundiced. Icteric.  Neuro:  Sedate. Nodded to voice, no other movement HEENT:  Scleral icterus. ETT in place Cardiovascular:  median sternotomy dressing dr. Kermit Balo s1/s2. (-) s3/m/r/g Lungs:  Fair ae. Rhonchi B  Abdomen:  Distended, some focal superficial bruising, dec BS Musculoskeletal:  No bony abnormalitiies Skin:  Bruising all over  extensor surfaces and belly, Gr 1 edema  LABS:  BMET  Recent Labs Lab 07/31/15 0333 07/31/15 1605 08/01/15 0345  NA 133* 133* 132*  K 4.5 4.3 4.3  CL 101 102 100*  CO2 22 22 23   BUN 49* 54* 50*  CREATININE 2.04* 2.20* 1.95*  GLUCOSE 115* 155* 117*    Electrolytes  Recent Labs Lab 07/30/15 0403  07/31/15 0333 07/31/15 0400 07/31/15 1605 08/01/15 0345  CALCIUM 8.5*  < > 8.2*  --  8.0* 8.1*  MG 2.7*  --  2.6*  --   --  2.6*  PHOS 5.7*  < >  --  5.1* 5.2* 5.1*  < > = values in this interval not displayed.  CBC  Recent Labs Lab 07/30/15 0403 07/31/15 0333 08/01/15 0345  WBC 13.7* 11.7* 12.4*  HGB 8.0* 7.9* 8.6*  HCT 24.4* 24.3* 26.7*  PLT 115* 136* 126*    Coag's  Recent Labs Lab 07/27/15 0310  APTT 30  INR 1.32   Sepsis Markers No results for input(s): LATICACIDVEN, PROCALCITON, O2SATVEN in the last 168 hours. ABG  Recent Labs Lab 07/30/15 1741 07/31/15 0340 08/01/15 0336  PHART 7.377 7.484* 7.473*  PCO2ART 45.6* 33.4* 34.1*  PO2ART 269.0* 92.0 95.0    Liver Enzymes  Recent Labs Lab 07/30/15 0403  07/31/15 0333 07/31/15 1605 08/01/15 0345  AST 179*  --  200*  --  285*  ALT 92*  --  87*  --  101*  ALKPHOS 565*  --  647*  --  877*  BILITOT 46.5*  --  45.2*  --  43.4*  ALBUMIN 2.1*  < > 1.9* 1.7* 1.8*  < > = values in this interval not displayed.  Cardiac Enzymes No results for input(s): TROPONINI, PROBNP in the last 168 hours.  Glucose  Recent Labs Lab 07/31/15 0823 07/31/15 1224 07/31/15 1619 07/31/15 1933 07/31/15 2354 08/01/15 0357  GLUCAP 135* 155* 156* 138* 160* 110*    Imaging Dg Chest Port 1 View  08/01/2015  CLINICAL DATA:  Chest tube in place. EXAM: PORTABLE CHEST 1 VIEW COMPARISON:  07/31/2015 FINDINGS: Tracheostomy tube remains in place. Enteric tube courses into the left upper abdomen with tip not imaged. Sternotomy wires and skin staples remain in place. Right subclavian catheter terminates over the upper SVC  and left subclavian catheter terminates over the mid SVC, both unchanged. A chest tube remains in the left lung base. The cardiac silhouette remains enlarged. Lung volumes are slightly greater than on yesterday's examination. There is mild pulmonary vascular congestion which has mildly improved. No overt alveolar edema is identified. No sizable pleural effusion or definite pneumothorax is identified, with the left lateral lung base slightly incompletely imaged. IMPRESSION: 1. Unchanged position of support devices. 2. Mild improvement of pulmonary vascular congestion. Electronically Signed   By: Logan Bores M.D.   On: 08/01/2015 07:51   STUDIES:  6/14 TTE >EF 30-35%  CULTURES: Sputum 6/10 >> neg  ANTIBIOTICS: 6/8 Vanc >>6/16; 6/18 >>  6/10 Zosyn >>6/16 Primaxin 6/16>>>  SIGNIFICANT EVENTS: 6/8 Redo CABG 6/8 take back to OR emergently for diffuse bleeding 6/16 back to OR to close sternum  LINES/TUBES: Left forearm fistula 6/8 Multiple chest tubes (mediastinal and pleural bilateral) >> 6/8 CVC left femoral vein >>out 6/8 R radial arterial line >>6/17>>>6/17>>> 6/8 L IJ [place] >>6/17 L [place] TLC 6/16>>> R [place] HD catheter 6/23 >>  Trach 6/21 >>   DISCUSSION: 70 y/o male s/p redo CABG 6/8 complicated by significant post operative bleeding in the setting of thrombocytopenia brought back to the Surgical ICU on 6/9 with hypoxemic respiratory failure.  He has baseline ESRD and DM2.  ASSESSMENT / PLAN:  PULMONARY A: Acute hypoxemic respiratory failure 2/2  Pulm edema, possible HCAP S/P Trache 6/20 by Hyman Bible Post operative pleural bleeding  P:   Cont vent support. Daily PST as tolerated. Sedation being cut down. Consider making maintenance mode high PS given asynchrony w PRVC PSV trials daily, goal to ATC but has not tolerated yet Continue CRRT FiO2 40% post op and PEEP 5. Intermittent CXR Chest tubes per thoracic surgery, replaced CT per surgery.  CARDIOVASCULAR A:  Severe  Cardiogenic and hemorrhagic shock CAD s/p redo CABG IABP d/c'd on 6/18 P:  Cont levophed and epi drips, wean as able Tele monitoring. Amiodarone off  RENAL A:   ESRD CKD V P:   Continue CVVHD per renal -- tolerating volume removal on pressors Renal following Replace electrolytes as indicated.  GASTROINTESTINAL A:   Jaundice and coagulopathy due to shocked liver. P:   OG tube TPN per pharmacy due to high residuals. H2 blocker for stress ulcer prophylaxis Abdominal U/S negative for obstruction, likely shocked liver.   HEMATOLOGIC A:   Diffuse oozing due to coagulopathy from blood loss, thrombocytopenia. Improved.  P:  Transfuse per TCTS guidelines. Trend CBC, coags.   INFECTIOUS A:   ?HCAP - doubt, respiratory culture negative Rare fungus in resp cx > suspect contaminant  P:   Empiric abx primaxin (D9) and vanco (D7); plan 10 days Hold off on fungal coverage at this point  ENDOCRINE A:   DM2   P:   Monitor glucose Insulin gtt per TCTS post op protocol  NEUROLOGIC A:   Sedation needed  for vent synchrony P:   RASS goal: -2 Work to decrease sedation as able.   FAMILY  - Updates:  No family bedside 6/23 - Inter-disciplinary family meet or Palliative Care meeting due by:   Critical care time with this patient today: 31  minutes.    Baltazar Apo, MD, PhD 08/01/2015, 8:28 AM [facility] [phone] or if no answer [phone]

## 2015-08-01 NOTE — Progress Notes (Signed)
Patient ID: Joseph [name], male   DOB: 1945/06/18, 70 y.o.   MRN: [id number] TCTS DAILY ICU PROGRESS NOTE                   [address]            [place] [address]          [phone]   9 Days Post-Op Procedure(s) (LRB): STERNAL CLOSURE WITH PUMP STANDBY (N/A) TRANSESOPHAGEAL ECHOCARDIOGRAM (TEE) (N/A)  Total Length of Stay:  LOS: 26 days   Subjective: Remains  minimally   responsive on vent   Objective: Vital signs in last 24 hours: Temp:  [98.9 F (37.2 C)-100 F (37.8 C)] 99.3 F (37.4 C) (06/25 0805) Pulse Rate:  [29-95] 86 (06/25 0730) Cardiac Rhythm:  [-] Normal sinus rhythm (06/25 0315) Resp:  [18-44] 35 (06/25 0730) BP: (100-130)/(39) 130/39 mmHg (06/24 1627) SpO2:  [100 %] 100 % (06/25 0730) Arterial Line BP: (79-170)/(30-57) 112/40 mmHg (06/25 0730) FiO2 (%):  [40 %] 40 % (06/25 0417) Weight:  [214 lb 15.2 oz (97.5 kg)] 214 lb 15.2 oz (97.5 kg) (06/25 0130)  Filed Weights   07/30/15 0500 07/31/15 0545 08/01/15 0130  Weight: 219 lb 9.3 oz (99.6 kg) 216 lb 1.6 oz (98.022 kg) 214 lb 15.2 oz (97.5 kg)    Weight change: -1 lb 2.4 oz (-0.522 kg)   Hemodynamic parameters for last 24 hours: CVP:  [12 mmHg-23 mmHg] 16 mmHg  Intake/Output from previous day: 06/24 0701 - 06/25 0700 In: 4153.7 [I.V.:1896.9; Blood:286; NG/GT:1240.8; IV Piggyback:700] Out: 5597 [Stool:850; Chest Tube:50]  Intake/Output this shift: Total I/O In: 161.6 [I.V.:91.6; NG/GT:70] Out: 161 [Other:151; Chest Tube:10]  Current Meds: Scheduled Meds: . sodium chloride   Intravenous Once  . antiseptic oral rinse  7 mL Mouth Rinse 10 times per day  . bisacodyl  10 mg Oral Daily   Or  . bisacodyl  10 mg Rectal Daily  . chlorhexidine gluconate (SAGE KIT)  15 mL Mouth Rinse BID  . darbepoetin (ARANESP) injection - NON-DIALYSIS  100 mcg Subcutaneous Q Fri-1800  . docusate sodium  200 mg Oral Daily  . feeding supplement (NEPRO CARB STEADY)  1,000 mL Per Tube Q24H  .  feeding supplement (PRO-STAT SUGAR FREE 64)  60 mL Per Tube QID  . fentaNYL (SUBLIMAZE) injection  200 mcg Intravenous Once  . imipenem-cilastatin  500 mg Intravenous Q8H  . insulin aspart  0-9 Units Subcutaneous Q4H  . insulin glargine  25 Units Subcutaneous BID  . levalbuterol  1.25 mg Nebulization Q6H  . metoCLOPramide (REGLAN) injection  10 mg Intravenous Q6H  . pantoprazole (PROTONIX) IV  40 mg Intravenous Q24H  . propofol  50 mL Intravenous Once  . QUEtiapine  50 mg Oral BID   Continuous Infusions: . sodium chloride 20 mL/hr at 08/01/15 0700  . EPINEPHrine 4 mg in dextrose 5% 250 mL infusion (16 mcg/mL) 6 mcg/min (08/01/15 0800)  . fentaNYL infusion INTRAVENOUS Stopped (08/01/15 0800)  . midazolam (VERSED) infusion Stopped (07/26/15 0852)  . milrinone 0.125 mcg/kg/min (08/01/15 0800)  . norepinephrine (LEVOPHED) Adult infusion 40 mcg/min (08/01/15 0800)  . dialysis replacement fluid (prismasate) 300 mL/hr at 07/31/15 1512  . dialysis replacement fluid (prismasate) 350 mL/hr at 07/31/15 2148  . dialysate (PRISMASATE) 1,500 mL/hr at 08/01/15 0738   PRN Meds:.fentaNYL, fentaNYL (SUBLIMAZE) injection, heparin, metoprolol, midazolam, ondansetron (ZOFRAN) IV, sodium chloride, sodium chloride flush, traMADol  General appearance: minimial responsive, not following commands, does not  open eyes, moved headt to name  Neurologic: as above Heart: regular rate and rhythm, S1, S2 normal, no murmur, click, rub or gallop Lungs: diminished breath sounds bibasilar Abdomen: soft, non-tender; bowel sounds normal; no masses,  no organomegaly Extremities: edema bilaterial Wound: sternum intact staples in place  Lab Results: Recent Results (from the past 240 hour(s))  Aerobic Culture (superficial specimen)     Status: None   Collection Time: 07/26/2015 10:04 AM  Result Value Ref Range Status   Specimen Description WOUND CHEST  Final   Special Requests STERNAL WOUND POF VANCO AND ZOSYN  Final    Gram Stain   Final    RARE WBC PRESENT,BOTH PMN AND MONONUCLEAR NO ORGANISMS SEEN    Culture NO GROWTH 2 DAYS  Final   Report Status 07/26/2015 FINAL  Final  Culture, respiratory (NON-Expectorated)     Status: None   Collection Time: 08/02/2015  3:36 PM  Result Value Ref Range Status   Specimen Description TRACHEAL ASPIRATE  Final   Special Requests Normal  Final   Gram Stain   Final    FEW WBC PRESENT,BOTH PMN AND MONONUCLEAR NO ORGANISMS SEEN    Culture NO GROWTH 2 DAYS  Final   Report Status 07/26/2015 FINAL  Final  Culture, blood (single) w Reflex to ID Panel     Status: None   Collection Time: 08/04/2015  6:48 PM  Result Value Ref Range Status   Specimen Description BLOOD CENTRAL LINE  Final   Special Requests BOTTLES DRAWN AEROBIC AND ANAEROBIC 10CC  Final   Culture NO GROWTH 5 DAYS  Final   Report Status 07/28/2015 FINAL  Final  Blood culture     Status: None (Preliminary result)   Collection Time: 07/29/15 12:02 PM  Result Value Ref Range Status   Specimen Description BLOOD RIGHT HAND  Final   Special Requests IN PEDIATRIC BOTTLE 3CC  Final   Culture NO GROWTH 2 DAYS  Final   Report Status PENDING  Incomplete  Culture, respiratory (NON-Expectorated)     Status: None (Preliminary result)   Collection Time: 07/29/15  3:22 PM  Result Value Ref Range Status   Specimen Description TRACHEAL ASPIRATE  Final   Special Requests NONE  Final   Gram Stain   Final    ABUNDANT WBC PRESENT,BOTH PMN AND MONONUCLEAR NO ORGANISMS SEEN    Culture   Final    RARE YEAST CRITICAL RESULT CALLED TO, READ BACK BY AND VERIFIED WITH: N ZAFARIS 07/31/15 @ 85 M VESTAL    Report Status PENDING  Incomplete        CBC: Recent Labs  07/31/15 0333 08/01/15 0345  WBC 11.7* 12.4*  HGB 7.9* 8.6*  HCT 24.3* 26.7*  PLT 136* 126*   BMET:  Recent Labs  07/31/15 1605 08/01/15 0345  NA 133* 132*  K 4.3 4.3  CL 102 100*  CO2 22 23  GLUCOSE 155* 117*  BUN 54* 50*  CREATININE 2.20*  1.95*  CALCIUM 8.0* 8.1*    T Bili 43.4   PT/INR: No results for input(s): LABPROT, INR in the last 72 hours. Radiology: Dg Chest Port 1 View  08/01/2015  CLINICAL DATA:  Chest tube in place. EXAM: PORTABLE CHEST 1 VIEW COMPARISON:  07/31/2015 FINDINGS: Tracheostomy tube remains in place. Enteric tube courses into the left upper abdomen with tip not imaged. Sternotomy wires and skin staples remain in place. Right subclavian catheter terminates over the upper SVC and left subclavian catheter terminates over the  mid SVC, both unchanged. A chest tube remains in the left lung base. The cardiac silhouette remains enlarged. Lung volumes are slightly greater than on yesterday's examination. There is mild pulmonary vascular congestion which has mildly improved. No overt alveolar edema is identified. No sizable pleural effusion or definite pneumothorax is identified, with the left lateral lung base slightly incompletely imaged. IMPRESSION: 1. Unchanged position of support devices. 2. Mild improvement of pulmonary vascular congestion. Electronically Signed   By: Logan Bores M.D.   On: 08/01/2015 07:51     Assessment/Plan: S/P Procedure(s) (LRB): STERNAL CLOSURE WITH PUMP STANDBY (N/A) TRANSESOPHAGEAL ECHOCARDIOGRAM (TEE) (N/A) Little change , epi slightly lower with adequate COX Rare yeast in sputum from 6/24, not noted 6/22- discussed with pulmonary, with toxicity of antifungal on liver,  will not add additional  Drugs at this time       Grace Isaac 08/01/2015 8:19 AM

## 2015-08-01 NOTE — Progress Notes (Signed)
Patient ID: Joseph [name], male   DOB: 07-24-1945, 70 y.o.   MRN: [id number] EVENING ROUNDS NOTE :     [address]       [place] [address]             [phone]                 9 Days Post-Op Procedure(s) (LRB): STERNAL CLOSURE WITH PUMP STANDBY (N/A) TRANSESOPHAGEAL ECHOCARDIOGRAM (TEE) (N/A)  Total Length of Stay:  LOS: 26 days  BP 123/42 mmHg  Pulse 86  Temp(Src) 98.9 F (37.2 C) (Axillary)  Resp 32  Ht 5\' 11"  (1.803 m)  Wt 214 lb 15.2 oz (97.5 kg)  BMI 29.99 kg/m2  SpO2 100%  .Intake/Output      06/24 0701 - 06/25 0700 06/25 0701 - 06/26 0700   I.V. (mL/kg) 1896.9 (19.5) 892.3 (9.2)   Blood 286    Other 30    NG/GT 1240.8 770   IV Piggyback 700 600   Total Intake(mL/kg) 4153.7 (42.6) 2262.3 (23.2)   Other 3288 2217   Stool 850 100   Chest Tube 50 10   Total Output 4188 2327   Net -34.3 -64.7          . sodium chloride 20 mL/hr at 08/01/15 1800  . EPINEPHrine 4 mg in dextrose 5% 250 mL infusion (16 mcg/mL) 6 mcg/min (08/01/15 1800)  . fentaNYL infusion INTRAVENOUS 50 mcg/hr (08/01/15 1800)  . midazolam (VERSED) infusion Stopped (07/26/15 0852)  . milrinone 0.125 mcg/kg/min (08/01/15 1800)  . norepinephrine (LEVOPHED) Adult infusion 38 mcg/min (08/01/15 1800)  . dialysis replacement fluid (prismasate) 300 mL/hr at 08/01/15 0840  . dialysis replacement fluid (prismasate) 350 mL/hr at 07/31/15 2148  . dialysate (PRISMASATE) 1,500 mL/hr at 08/01/15 1600     Lab Results  Component Value Date   WBC 12.4* 08/01/2015   HGB 8.6* 08/01/2015   HCT 26.7* 08/01/2015   PLT 126* 08/01/2015   GLUCOSE 140* 08/01/2015   CHOL 88 07/06/2015   TRIG 258* 07/26/2015   HDL 26* 07/06/2015   LDLCALC 25 07/06/2015   ALT 101* 08/01/2015   AST 285* 08/01/2015   NA 134* 08/01/2015   K 4.3 08/01/2015   CL 102 08/01/2015   CREATININE 1.81* 08/01/2015   BUN 48* 08/01/2015   CO2 24 08/01/2015   TSH 2.026 11/04/2014   PSA <0.1 02/20/2013   INR 1.32  07/27/2015   HGBA1C 6.9* 06/12/2015   Unchanged today, but with multi system organ failure  Grace Isaac MD  Beeper [phone] Office [phone] 08/01/2015 6:49 PM

## 2015-08-01 NOTE — Progress Notes (Signed)
70 y.o. male with ESRD who was admitted 06/20/2015 with CP and subsequent redo CABG complicated by blood loss req transfusion and hemodynamic instability -s/p balloon pump, VDRF, shock, CRRT Assessment/Plan: 1. CAD- complicated redo CABG- on pressors 2. ESRD -TTS [facility]-on CRRT now since 6/17. Has left forearm AVF -Increased fluids pre filter fluid to prevent clotting. Will bolus 500cc NS today and keep even with CRRT  3. Anemia- has required transfusions-  4. HTN/volume- CRRT with volume removal 5. Inc LFTs/hyperbilirubinemia  6. Alkalosis, improved off citrate, problematic usage with liver disease  Objective: Vital signs in last 24 hours: Temp:  [99 F (37.2 C)-100 F (37.8 C)] 99.3 F (37.4 C) (06/25 0805) Pulse Rate:  [29-95] 84 (06/25 0831) Resp:  [18-44] 36 (06/25 0831) BP: (100-130)/(39) 130/39 mmHg (06/24 1627) SpO2:  [100 %] 100 % (06/25 0831) Arterial Line BP: (79-170)/(30-57) 112/40 mmHg (06/25 0730) FiO2 (%):  [40 %] 40 % (06/25 0831) Weight:  [97.5 kg (214 lb 15.2 oz)] 97.5 kg (214 lb 15.2 oz) (06/25 0130) Weight change: -0.522 kg (-1 lb 2.4 oz)  Intake/Output from previous day: 06/24 0701 - 06/25 0700 In: 4153.7 [I.V.:1896.9; Blood:286; NG/GT:1240.8; IV Piggyback:700] Out: 4188 [Stool:850; Chest Tube:50] Intake/Output this shift: Total I/O In: 161.6 [I.V.:91.6; NG/GT:70] Out: 161 [Other:151; Chest Tube:10]  General appearance: opens eyes and turns to voice Extremities: wrinkles in LEs, AVF functionong LUE  Jaundiced  Lab Results:  Recent Labs  07/31/15 0333 08/01/15 0345  WBC 11.7* 12.4*  HGB 7.9* 8.6*  HCT 24.3* 26.7*  PLT 136* 126*   BMET:  Recent Labs  07/31/15 1605 08/01/15 0345  NA 133* 132*  K 4.3 4.3  CL 102 100*  CO2 22 23  GLUCOSE 155* 117*  BUN 54* 50*  CREATININE 2.20* 1.95*  CALCIUM 8.0* 8.1*   No results for input(s): PTH in the last 72 hours. Iron Studies: No results for input(s): IRON, TIBC, TRANSFERRIN,  FERRITIN in the last 72 hours. Studies/Results: Dg Chest Port 1 View  08/01/2015  CLINICAL DATA:  Chest tube in place. EXAM: PORTABLE CHEST 1 VIEW COMPARISON:  07/31/2015 FINDINGS: Tracheostomy tube remains in place. Enteric tube courses into the left upper abdomen with tip not imaged. Sternotomy wires and skin staples remain in place. Right subclavian catheter terminates over the upper SVC and left subclavian catheter terminates over the mid SVC, both unchanged. A chest tube remains in the left lung base. The cardiac silhouette remains enlarged. Lung volumes are slightly greater than on yesterday's examination. There is mild pulmonary vascular congestion which has mildly improved. No overt alveolar edema is identified. No sizable pleural effusion or definite pneumothorax is identified, with the left lateral lung base slightly incompletely imaged. IMPRESSION: 1. Unchanged position of support devices. 2. Mild improvement of pulmonary vascular congestion. Electronically Signed   By: Sebastian Ache M.D.   On: 08/01/2015 07:51   Dg Chest Port 1 View  07/31/2015  CLINICAL DATA:  Status post CABG x3. EXAM: PORTABLE CHEST 1 VIEW COMPARISON:  Chest x-ray dated 07/30/2015. FINDINGS: Cardiomegaly is stable. Median sternotomy wires in place. Left subclavian central line is stable in position with tip at the level of the mid SVC. Right subclavian catheter is stable in position with tip at the level of the upper SVC. Enteric tube passes below the diaphragm. Left-sided chest tube stable in position at the left lung base. Tracheostomy remains well position. There is mild central pulmonary vascular congestion and mild interstitial edema, without overt alveolar pulmonary edema.  No pleural effusion or pneumothorax seen. IMPRESSION: 1. Postsurgical changes as above. Support apparatus appears appropriately positioned. 2. Mild central pulmonary vascular congestion and mild interstitial edema without overt alveolar pulmonary edema.  3. Cardiomegaly, stable. Electronically Signed   By: Franki Cabot M.D.   On: 07/31/2015 09:53   Dg Chest Port 1 View  07/30/2015  CLINICAL DATA:  Encounter for central line placement EXAM: PORTABLE CHEST - 1 VIEW COMPARISON:  Earlier film of the same day FINDINGS: Right subclavian dialysis scratch the right subclavian central venous catheter some placed to the proximal SVC. No pneumothorax. Stable position of tracheostomy device, feeding tube, left subclavian central line. Previous CABG. Mild cardiomegaly. Left chest tube stable in position, with no pneumothorax. No definite effusion. Patchy atelectasis in the lung bases, slightly improved since previous exam. IMPRESSION: 1. Interval placement of right subclavian central venous catheter, tip in the proximal SVC, no pneumothorax. 2.  Support hardware stable in position. 3. Patchy bibasilar atelectasis, slightly improved since earlier exam. Electronically Signed   By: Lucrezia Europe M.D.   On: 07/30/2015 15:20    Scheduled: . sodium chloride   Intravenous Once  . antiseptic oral rinse  7 mL Mouth Rinse 10 times per day  . bisacodyl  10 mg Oral Daily   Or  . bisacodyl  10 mg Rectal Daily  . chlorhexidine gluconate (SAGE KIT)  15 mL Mouth Rinse BID  . darbepoetin (ARANESP) injection - NON-DIALYSIS  100 mcg Subcutaneous Q Fri-1800  . docusate sodium  200 mg Oral Daily  . feeding supplement (NEPRO CARB STEADY)  1,000 mL Per Tube Q24H  . feeding supplement (PRO-STAT SUGAR FREE 64)  60 mL Per Tube QID  . fentaNYL (SUBLIMAZE) injection  200 mcg Intravenous Once  . imipenem-cilastatin  500 mg Intravenous Q8H  . insulin aspart  0-9 Units Subcutaneous Q4H  . insulin glargine  25 Units Subcutaneous BID  . levalbuterol  1.25 mg Nebulization Q6H  . metoCLOPramide (REGLAN) injection  10 mg Intravenous Q6H  . pantoprazole (PROTONIX) IV  40 mg Intravenous Q24H  . propofol  50 mL Intravenous Once  . QUEtiapine  50 mg Oral BID       LOS: 26 days    Kimbely Whiteaker C 08/01/2015,8:57 AM

## 2015-08-02 ENCOUNTER — Inpatient Hospital Stay (HOSPITAL_COMMUNITY): Payer: Medicare Other

## 2015-08-02 DIAGNOSIS — J9620 Acute and chronic respiratory failure, unspecified whether with hypoxia or hypercapnia: Secondary | ICD-10-CM

## 2015-08-02 LAB — CBC
HCT: 22.9 % — ABNORMAL LOW (ref 39.0–52.0)
HCT: 27.9 % — ABNORMAL LOW (ref 39.0–52.0)
Hemoglobin: 7.1 g/dL — ABNORMAL LOW (ref 13.0–17.0)
Hemoglobin: 8.6 g/dL — ABNORMAL LOW (ref 13.0–17.0)
MCH: 29 pg (ref 26.0–34.0)
MCH: 28.9 pg (ref 26.0–34.0)
MCHC: 31 g/dL (ref 30.0–36.0)
MCHC: 30.8 g/dL (ref 30.0–36.0)
MCV: 93.5 fL (ref 78.0–100.0)
MCV: 93.6 fL (ref 78.0–100.0)
Platelets: 127 10*3/uL — ABNORMAL LOW (ref 150–400)
Platelets: 130 10*3/uL — ABNORMAL LOW (ref 150–400)
RBC: 2.45 MIL/uL — ABNORMAL LOW (ref 4.22–5.81)
RBC: 2.98 MIL/uL — ABNORMAL LOW (ref 4.22–5.81)
RDW: 23.2 % — ABNORMAL HIGH (ref 11.5–15.5)
RDW: 23 % — ABNORMAL HIGH (ref 11.5–15.5)
WBC: 12.9 10*3/uL — ABNORMAL HIGH (ref 4.0–10.5)
WBC: 16.3 10*3/uL — ABNORMAL HIGH (ref 4.0–10.5)

## 2015-08-02 LAB — BLOOD GAS, ARTERIAL
Acid-base deficit: 1 mmol/L (ref 0.0–2.0)
Bicarbonate: 23.1 mEq/L (ref 20.0–24.0)
Drawn by: 437071
FIO2: 0.4
MECHVT: 500 mL
O2 Saturation: 98.6 %
PEEP: 5 cmH2O
Patient temperature: 97.9
RATE: 16 resp/min
TCO2: 24.2 mmol/L (ref 0–100)
pCO2 arterial: 36.8 mmHg (ref 35.0–45.0)
pH, Arterial: 7.411 (ref 7.350–7.450)
pO2, Arterial: 112 mmHg — ABNORMAL HIGH (ref 80.0–100.0)

## 2015-08-02 LAB — CULTURE, RESPIRATORY W GRAM STAIN

## 2015-08-02 LAB — COMPREHENSIVE METABOLIC PANEL WITH GFR
ALT: 77 U/L — ABNORMAL HIGH (ref 17–63)
AST: 224 U/L — ABNORMAL HIGH (ref 15–41)
Albumin: 1.4 g/dL — ABNORMAL LOW (ref 3.5–5.0)
Alkaline Phosphatase: 760 U/L — ABNORMAL HIGH (ref 38–126)
Anion gap: 5 (ref 5–15)
BUN: 41 mg/dL — ABNORMAL HIGH (ref 6–20)
CO2: 20 mmol/L — ABNORMAL LOW (ref 22–32)
Calcium: 6.4 mg/dL — CL (ref 8.9–10.3)
Chloride: 113 mmol/L — ABNORMAL HIGH (ref 101–111)
Creatinine, Ser: 1.61 mg/dL — ABNORMAL HIGH (ref 0.61–1.24)
GFR calc Af Amer: 49 mL/min — ABNORMAL LOW
GFR calc non Af Amer: 43 mL/min — ABNORMAL LOW
Glucose, Bld: 127 mg/dL — ABNORMAL HIGH (ref 65–99)
Potassium: 3.3 mmol/L — ABNORMAL LOW (ref 3.5–5.1)
Sodium: 138 mmol/L (ref 135–145)
Total Protein: 4.1 g/dL — ABNORMAL LOW (ref 6.5–8.1)

## 2015-08-02 LAB — POCT I-STAT 3, ART BLOOD GAS (G3+)
Bicarbonate: 24.5 mEq/L — ABNORMAL HIGH (ref 20.0–24.0)
O2 SAT: 98 %
PCO2 ART: 37.3 mmHg (ref 35.0–45.0)
TCO2: 26 mmol/L (ref 0–100)
pH, Arterial: 7.428 (ref 7.350–7.450)
pO2, Arterial: 103 mmHg — ABNORMAL HIGH (ref 80.0–100.0)

## 2015-08-02 LAB — COMPREHENSIVE METABOLIC PANEL
ALT: 101 U/L — ABNORMAL HIGH (ref 17–63)
AST: 287 U/L — ABNORMAL HIGH (ref 15–41)
Albumin: 1.7 g/dL — ABNORMAL LOW (ref 3.5–5.0)
Alkaline Phosphatase: 1066 U/L — ABNORMAL HIGH (ref 38–126)
Anion gap: 9 (ref 5–15)
BUN: 48 mg/dL — ABNORMAL HIGH (ref 6–20)
CO2: 25 mmol/L (ref 22–32)
Calcium: 8.2 mg/dL — ABNORMAL LOW (ref 8.9–10.3)
Chloride: 101 mmol/L (ref 101–111)
Creatinine, Ser: 1.93 mg/dL — ABNORMAL HIGH (ref 0.61–1.24)
GFR calc Af Amer: 40 mL/min — ABNORMAL LOW (ref >60)
GFR calc non Af Amer: 34 mL/min — ABNORMAL LOW (ref >60)
Glucose, Bld: 148 mg/dL — ABNORMAL HIGH (ref 65–99)
Potassium: 4.3 mmol/L (ref 3.5–5.1)
Sodium: 135 mmol/L (ref 135–145)
Total Bilirubin: 41.9 mg/dL (ref 0.3–1.2)
Total Protein: 5.3 g/dL — ABNORMAL LOW (ref 6.5–8.1)

## 2015-08-02 LAB — GLUCOSE, CAPILLARY
GLUCOSE-CAPILLARY: 142 mg/dL — AB (ref 65–99)
GLUCOSE-CAPILLARY: 151 mg/dL — AB (ref 65–99)
GLUCOSE-CAPILLARY: 148 mg/dL — AB (ref 65–99)
GLUCOSE-CAPILLARY: 154 mg/dL — AB (ref 65–99)
Glucose-Capillary: 160 mg/dL — ABNORMAL HIGH (ref 65–99)

## 2015-08-02 LAB — RENAL FUNCTION PANEL
Albumin: 2 g/dL — ABNORMAL LOW (ref 3.5–5.0)
Anion gap: 11 (ref 5–15)
BUN: 51 mg/dL — ABNORMAL HIGH (ref 6–20)
CO2: 20 mmol/L — ABNORMAL LOW (ref 22–32)
Calcium: 8.6 mg/dL — ABNORMAL LOW (ref 8.9–10.3)
Chloride: 103 mmol/L (ref 101–111)
Creatinine, Ser: 1.92 mg/dL — ABNORMAL HIGH (ref 0.61–1.24)
GFR calc Af Amer: 40 mL/min — ABNORMAL LOW
GFR calc non Af Amer: 34 mL/min — ABNORMAL LOW
Glucose, Bld: 154 mg/dL — ABNORMAL HIGH (ref 65–99)
Phosphorus: 4.9 mg/dL — ABNORMAL HIGH (ref 2.5–4.6)
Potassium: 4.4 mmol/L (ref 3.5–5.1)
Sodium: 134 mmol/L — ABNORMAL LOW (ref 135–145)

## 2015-08-02 LAB — CALCIUM, IONIZED: Calcium, Ionized, Serum: 4.7 mg/dL (ref 4.5–5.6)

## 2015-08-02 LAB — CARBOXYHEMOGLOBIN
Carboxyhemoglobin: 2 % — ABNORMAL HIGH (ref 0.5–1.5)
Methemoglobin: 0.6 % (ref 0.0–1.5)
O2 Saturation: 62.4 %
Total hemoglobin: 8.6 g/dL — ABNORMAL LOW (ref 13.5–18.0)

## 2015-08-02 LAB — PHOSPHORUS
Phosphorus: 3.8 mg/dL (ref 2.5–4.6)
Phosphorus: 4.8 mg/dL — ABNORMAL HIGH (ref 2.5–4.6)

## 2015-08-02 LAB — MAGNESIUM
Magnesium: 2 mg/dL (ref 1.7–2.4)
Magnesium: 2.6 mg/dL — ABNORMAL HIGH (ref 1.7–2.4)

## 2015-08-02 MED ORDER — DEXMEDETOMIDINE HCL IN NACL 400 MCG/100ML IV SOLN
0.4000 ug/kg/h | INTRAVENOUS | Status: DC
Start: 2015-08-02 — End: 2015-08-03
  Administered 2015-08-02 (×3): 0.4 ug/kg/h via INTRAVENOUS
  Administered 2015-08-02: 0.2 ug/kg/h via INTRAVENOUS
  Administered 2015-08-03: 0.4 ug/kg/h via INTRAVENOUS
  Filled 2015-08-02 (×2): qty 50
  Filled 2015-08-02: qty 100
  Filled 2015-08-02 (×3): qty 50
  Filled 2015-08-02: qty 100

## 2015-08-02 MED ORDER — VANCOMYCIN HCL IN DEXTROSE 1-5 GM/200ML-% IV SOLN
1000.0000 mg | Freq: Two times a day (BID) | INTRAVENOUS | Status: DC
Start: 2015-08-02 — End: 2015-08-02

## 2015-08-02 MED ORDER — VANCOMYCIN HCL IN DEXTROSE 1-5 GM/200ML-% IV SOLN
1000.0000 mg | Freq: Once | INTRAVENOUS | Status: DC
  Filled 2015-08-02: qty 200

## 2015-08-02 MED ORDER — VASOPRESSIN 20 UNIT/ML IV SOLN
0.0100 [IU]/min | INTRAVENOUS | Status: DC
  Administered 2015-08-02 – 2015-08-03 (×2): 0.03 [IU]/min via INTRAVENOUS
  Administered 2015-08-05 – 2015-08-09 (×7): 0.01 [IU]/min via INTRAVENOUS
  Filled 2015-08-02 (×9): qty 2

## 2015-08-02 MED ORDER — ALBUMIN HUMAN 5 % IV SOLN
12.5000 g | Freq: Once | INTRAVENOUS | Status: AC
  Administered 2015-08-02: 12.5 g via INTRAVENOUS
  Filled 2015-08-02: qty 250

## 2015-08-02 MED ORDER — VANCOMYCIN HCL IN DEXTROSE 1-5 GM/200ML-% IV SOLN
1000.0000 mg | Freq: Once | INTRAVENOUS | Status: AC
  Administered 2015-08-02: 1000 mg via INTRAVENOUS
  Filled 2015-08-02 (×2): qty 200

## 2015-08-02 MED ORDER — SODIUM CHLORIDE 0.9% FLUSH: 10.0000 mL | INTRAVENOUS | Status: DC | PRN

## 2015-08-02 MED ORDER — GERHARDT'S BUTT CREAM
TOPICAL_CREAM | CUTANEOUS | Status: DC | PRN
  Filled 2015-08-02 (×3): qty 1

## 2015-08-02 MED ORDER — POTASSIUM CHLORIDE 20 MEQ/15ML (10%) PO SOLN
40.0000 meq | Freq: Once | ORAL | Status: DC
Start: 2015-08-02 — End: 2015-08-02

## 2015-08-02 MED ORDER — SODIUM CHLORIDE 0.9% FLUSH
10.0000 mL | Freq: Two times a day (BID) | INTRAVENOUS | Status: DC
  Administered 2015-08-02: 10 mL
  Administered 2015-08-02: 20 mL
  Administered 2015-08-02: 10 mL
  Administered 2015-08-03: 40 mL
  Administered 2015-08-04: 10 mL
  Administered 2015-08-04: 40 mL
  Administered 2015-08-05: 10 mL
  Administered 2015-08-05: 40 mL
  Administered 2015-08-06 – 2015-08-08 (×3): 10 mL
  Administered 2015-08-08 – 2015-08-09 (×2): 20 mL

## 2015-08-02 MED ORDER — FLUCONAZOLE IN SODIUM CHLORIDE 100-0.9 MG/50ML-% IV SOLN
100.0000 mg | Freq: Once | INTRAVENOUS | Status: AC
  Administered 2015-08-02: 100 mg via INTRAVENOUS
  Filled 2015-08-02: qty 50

## 2015-08-02 MED ORDER — CALCIUM CHLORIDE 10 % IV SOLN
INTRAVENOUS | Status: AC
  Administered 2015-08-02: 1000 mg
  Filled 2015-08-02: qty 10

## 2015-08-02 NOTE — Progress Notes (Signed)
Pt developed increased WOB, PS increased in increments up to 20cmH2O.  RN at bedside and aware.

## 2015-08-02 NOTE — Progress Notes (Signed)
Pt continued w/ increased WOB and developed PVC's, decreased BP. Pt placed back on full vent support. RN at bedside and aware.

## 2015-08-02 NOTE — Op Note (Signed)
NAMEAVRUM, RAMP NO.:  [id number]  MEDICAL RECORD NO.:  [id number]  LOCATION:                                 FACILITY:  PHYSICIAN:  Ivin Poot, M.D.  DATE OF BIRTH:  Jun 22, 1945  DATE OF PROCEDURE:  07/30/2015 DATE OF DISCHARGE:                              OPERATIVE REPORT   OPERATION:  Placement of Trialysis catheter-temporary hemodialysis catheter via right subclavian vein.  SURGEON:  Ivin Poot, MD  PREOPERATIVE DIAGNOSIS:  Acute-on-chronic renal failure requiring CRT after redo coronary artery bypass graft.  POSTOPERATIVE DIAGNOSIS:  Acute-on-chronic renal failure requiring CRT after redo coronary artery bypass graft.  ANESTHESIA:  Local, 1% lidocaine and IV conscious sedation.  OPERATIVE PROCEDURE:  The patient remains on CRT after redo CABG.  The catheter that was initially placed was via left femoral vein.  This catheter needs to be removed from the groin.  A consent for a new catheter in the subclavian vein was obtained from the family.  The platelet count today was satisfactory.  The right chest was prepped and draped as a sterile field.  A proper time-out was performed.  Local 1% lidocaine was infiltrated beneath the right clavicle.  Using the Seldinger technique, a guidewire was passed into the SVC.  Over the SVC, dilators were passed.  Over the guidewire, the Trialysis catheter was then passed and confirmed by x-ray to be in the SVC.  It was secured to the skin after it was flushed and a sterile dressing was applied.  No known complications.     Ivin Poot, M.D.   ______________________________ Ivin Poot, M.D.    PV/MEDQ  D:  07/30/2015  T:  07/30/2015  Job:  [id number]

## 2015-08-02 NOTE — Progress Notes (Signed)
eLink Physician-Brief Progress Note Patient Name: Joseph [name] DOB: [birth year] MRN: [id number]   Date of Service  08/02/2015  HPI/Events of Note  Hypokalemia 3.3. Creatinine 1.61 on CVVHD. Magnesium 2.0. Has CVL & NGT.  eICU Interventions  KCl 49mEq VT x1     Intervention Category Intermediate Interventions: Electrolyte abnormality - evaluation and management  Tera Partridge 08/02/2015, 5:21 AM

## 2015-08-02 NOTE — Progress Notes (Signed)
[facility] Progress Note    Assessment/ Plan:   70 y.o. male with ESRD who was admitted 06/11/2015 with CP and subsequent redo CABG complicated by blood loss req transfusion and hemodynamic instability -s/p balloon pump, VDRF, shock, CRRT  1. CAD- complicated redo CABG- continues to be on pressors; increasing Levophed doses. 2. ESRD -TTS [facility]-on CRRT now since 6/17. Has left forearm AVF -Increased fluids pre filter fluid to prevent clotting. S/p 500cc bolus on 6/25. For today would keep even with CRRT. 3. Anemia- has required transfusions- fairly stable h/h today. 4. HTN/volume- CRRT with volume removal (keep net even) 5. Inc LFTs/hyperbilirubinemia  6. Alkalosis, improved off citrate, problematic usage with liver disease  Subjective:   Continues to be on the vent with worsening pressor requirements. CVP mildly elevated. No events overnight.    Objective:   BP 123/42 mmHg  Pulse 76  Temp(Src) 98.3 F (36.8 C) (Oral)  Resp 19  Ht 5' 11" (1.803 m)  Wt 96.6 kg (212 lb 15.4 oz)  BMI 29.72 kg/m2  SpO2 100%  Intake/Output Summary (Last 24 hours) at 08/02/15 0746 Last data filed at 08/02/15 0700  Gross per 24 hour  Intake 4499.99 ml  Output   4683 ml  Net -183.01 ml   Weight change: -0.9 kg (-1 lb 15.8 oz)  Physical Exam: General appearance: On vent Extremities: trace in the lower extremities. Jaundiced  RRR Decreased BS Rales posteriorly, good air movement.  Imaging: Dg Chest Port 1 View  08/01/2015  CLINICAL DATA:  Chest tube in place. EXAM: PORTABLE CHEST 1 VIEW COMPARISON:  07/31/2015 FINDINGS: Tracheostomy tube remains in place. Enteric tube courses into the left upper abdomen with tip not imaged. Sternotomy wires and skin staples remain in place. Right subclavian catheter terminates over the upper SVC and left subclavian catheter terminates over the mid SVC, both unchanged. A chest tube remains in the left lung base. The cardiac  silhouette remains enlarged. Lung volumes are slightly greater than on yesterday's examination. There is mild pulmonary vascular congestion which has mildly improved. No overt alveolar edema is identified. No sizable pleural effusion or definite pneumothorax is identified, with the left lateral lung base slightly incompletely imaged. IMPRESSION: 1. Unchanged position of support devices. 2. Mild improvement of pulmonary vascular congestion. Electronically Signed   By: Logan Bores M.D.   On: 08/01/2015 07:51    Labs: BMET  Recent Labs Lab 07/30/15 1559 07/31/15 0333 07/31/15 0400 07/31/15 1605 08/01/15 0345 08/01/15 1553 08/02/15 0407 08/02/15 0520  NA 133* 133*  --  133* 132* 134* 138 135  K 4.6 4.5  --  4.3 4.3 4.3 3.3* 4.3  CL 103 101  --  102 100* 102 113* 101  CO2 21* 22  --  _0 20* 25  GLUCOSE 119* 115*  --  155* 117* 140* 127* 148*  BUN 49* 49*  --  54* 50* 48* 41* 48*  CREATININE 2.12* 2.04*  --  2.20* 1.95* 1.81* 1.61* 1.93*  CALCIUM 8.2* 8.2*  --  8.0* 8.1* 8.0* 6.4* 8.2*  PHOS 5.3*  --  5.1* 5.2* 5.1* 4.4 3.8 4.8*   CBC  Recent Labs Lab 07/31/15 0333 08/01/15 0345 08/02/15 0407 08/02/15 0520  WBC 11.7* 12.4* 12.9* 16.3*  HGB 7.9* 8.6* 7.1* 8.6*  HCT 24.3* 26.7* 22.9* 27.9*  MCV 89.0 90.5 93.5 93.6  PLT 136* 126* 127* 130*    Medications:    . sodium chloride   Intravenous Once  .  albumin human  12.5 g Intravenous Once  . antiseptic oral rinse  7 mL Mouth Rinse 10 times per day  . bisacodyl  10 mg Oral Daily   Or  . bisacodyl  10 mg Rectal Daily  . chlorhexidine gluconate (SAGE KIT)  15 mL Mouth Rinse BID  . darbepoetin (ARANESP) injection - NON-DIALYSIS  100 mcg Subcutaneous Q Fri-1800  . docusate sodium  200 mg Oral Daily  . feeding supplement (NEPRO CARB STEADY)  1,000 mL Per Tube Q24H  . feeding supplement (PRO-STAT SUGAR FREE 64)  60 mL Per Tube QID  . fentaNYL (SUBLIMAZE) injection  200 mcg Intravenous Once  . imipenem-cilastatin  500 mg  Intravenous Q8H  . insulin aspart  0-9 Units Subcutaneous Q4H  . insulin glargine  25 Units Subcutaneous BID  . levalbuterol  1.25 mg Nebulization Q6H  . metoCLOPramide (REGLAN) injection  10 mg Intravenous Q6H  . pantoprazole (PROTONIX) IV  40 mg Intravenous Q24H  . propofol  50 mL Intravenous Once  . QUEtiapine  50 mg Oral BID  . sodium chloride flush  10-40 mL Intracatheter Q12H      Otelia Santee, MD 08/02/2015, 7:46 AM

## 2015-08-02 NOTE — Progress Notes (Signed)
10 Days Post-Op Procedure(s) (LRB): STERNAL CLOSURE WITH PUMP STANDBY (N/A) TRANSESOPHAGEAL ECHOCARDIOGRAM (TEE) (N/A) Subjective: Over the past 24 hours the patient has developed evidence of sepsis with increased white count, increased pressor  requirements, deterioration in mental status, and worsening pulmonary status. The patient been cultured and broad-spectrum antibiotics are continuing.  Objective: Vital signs in last 24 hours: Temp:  [98.3 F (36.8 C)-99.5 F (37.5 C)] 98.9 F (37.2 C) (06/26 1647) Pulse Rate:  [28-93] 65 (06/26 1815) Cardiac Rhythm:  [-] Normal sinus rhythm (06/26 1600) Resp:  [17-52] 24 (06/26 1815) BP: (105-115)/(37-84) 115/84 mmHg (06/26 1616) SpO2:  [100 %] 100 % (06/26 1815) Arterial Line BP: (78-180)/(26-58) 111/29 mmHg (06/26 1815) FiO2 (%):  [40 %] 40 % (06/26 1616) Weight:  [212 lb 15.4 oz (96.6 kg)] 212 lb 15.4 oz (96.6 kg) (06/26 0427)  Hemodynamic parameters for last 24 hours: CVP:  [7 mmHg-13 mmHg] 12 mmHg  Intake/Output from previous day: 06/25 0701 - 06/26 0700 In: 4509.8 [I.V.:2139.8; NG/GT:1370; IV Piggyback:1000] Out: [id number] [Stool:475; Chest Tube:10] Intake/Output this shift: Total I/O In: 1384.7 [I.V.:474.7; NG/GT:760; IV Piggyback:150] Out: 2107 [Other:1632; Stool:475]  Sedated on ventilator with tracheostomy Coarse breath sounds Deeply jaundiced skin Surgical incisions clean and dry  Lab Results:  Recent Labs  08/02/15 0407 08/02/15 0520  WBC 12.9* 16.3*  HGB 7.1* 8.6*  HCT 22.9* 27.9*  PLT 127* 130*   BMET:  Recent Labs  08/02/15 0520 08/02/15 1408  NA 135 134*  K 4.3 4.4  CL 101 103  CO2 25 20*  GLUCOSE 148* 154*  BUN 48* 51*  CREATININE 1.93* 1.92*  CALCIUM 8.2* 8.6*    PT/INR: No results for input(s): LABPROT, INR in the last 72 hours. ABG    Component Value Date/Time   PHART 7.428 08/02/2015 1414   HCO3 24.5* 08/02/2015 1414   TCO2 26 08/02/2015 1414   ACIDBASEDEF 2.0 07/30/2015 0414   O2SAT  98.0 08/02/2015 1414   CBG (last 3)   Recent Labs  08/02/15 0752 08/02/15 1323 08/02/15 1646  GLUCAP 151* 148* 154*    Assessment/Plan: S/P Procedure(s) (LRB): STERNAL CLOSURE WITH PUMP STANDBY (N/A) TRANSESOPHAGEAL ECHOCARDIOGRAM (TEE) (N/A) Continue antibiotics and pressors -vasopressin added Add Diflucan for fungal coverage, check vancomycin level for vancomycin dosing  LOS: 27 days    Tharon Aquas Trigt III 08/02/2015

## 2015-08-02 NOTE — Progress Notes (Signed)
PULMONARY / CRITICAL CARE MEDICINE   Name: Joseph [name] MRN: [id number] DOB: Apr 28, 1945    ADMISSION DATE:  06/25/2015 CONSULTATION DATE:  07/16/2015  REFERRING MD:  Nils Pyle  CHIEF COMPLAINT:  Post cardiothoracic surgery ventilator management  subj   70 y/o male with ESRD and significant CAD (CABG 2001, DES 2016, EF 40-45%) was admitted on 5/29 with NSTEMI and had persistent chest pain during his hospitalization so he was taken for a redo CABG on 6/8 (SVG to diag and SVG to OM, IABP placement and CVL placement).  Post operatively he had excessive bleeding from all chest drains (pleural and mediastinal) so he was taken back to the OR on 6/9 for re-exploration.  PCCM was consulted for ventilator management.     STUDIES:  6/14 TTE >EF 30-35%  CULTURES: Sputum 6/10 >> neg  ANTIBIOTICS: 6/8 Vanc >>6/16; 6/18 >>  6/10 Zosyn >>6/16 Primaxin 6/16>>>  LINES/TUBES: Left forearm fistula 6/8 Multiple chest tubes (mediastinal and pleural bilateral) >> 6/8 CVC left femoral vein >>out 6/8 R radial arterial line >>6/17>>>6/17>>> 6/8 L IJ Swan Ganz >>6/17 L Helen TLC 6/16>>> R  HD catheter 6/23 >>  Trach 6/20 >>   SIGNIFICANT EVENTS: 6/8 Redo CABG 6/8 take back to OR emergently for diffuse bleeding 6/9 - Rx CRRT 6/10 - start paralysis continuous 6/14 - dc paralysis 6/16 back to OR to close sternum 6/17 - dc IABP 6/20 - s/p trach  YACOUB 07/28/15 - post pyloric tube 07/31/15  - rt subclavian trialysis catgh 6/25:  > norepi up to 40, epi down to 6. CVVHD running. He tolerates PSV, is tachypneic no matter what vent mode mode we use. Note rare fungus on resp cx   SUBJECTIVE/OVERNIGHT/INTERVAL HX 6/26: on trach. On CRRT;keeping even On pressors - epi 6. Levophed 40-50 for sbp > 100. On precedex. Not following commands. Off precerdex wob increases. On PRVC; RN says does well on day time SBT. VERY JAUNDICED   VITAL SIGNS: BP 105/37 mmHg  Pulse 81  Temp(Src) 99.5 F (37.5 C)  (Axillary)  Resp 26  Ht 5\' 11"  (1.803 m)  Wt 96.6 kg (212 lb 15.4 oz)  BMI 29.72 kg/m2  SpO2 100%  HEMODYNAMICS: CVP:  [7 mmHg-13 mmHg] 13 mmHg  VENTILATOR SETTINGS: Vent Mode:  [-] PRVC FiO2 (%):  [40 %] 40 % Set Rate:  [16 bmp] 16 bmp Vt Set:  [500 mL] 500 mL PEEP:  [5 cmH20] 5 cmH20  INTAKE / OUTPUT: I/O last 3 completed shifts: In: 6384.8 [I.V.:3094.8; NG/GT:2090; IV Piggyback:1200] Out: [id number] [Other:5790; Stool:975; Chest Tube:20]  PHYSICAL EXAMINATION: General:  Critically ill on vent, CVVHD, jaundiced. Icteric.  Neuro:  Sedate. Nodded to voice, no other movement HEENT:  Scleral icterus.  TRACH in place Cardiovascular:  median sternotomy dressing dr. Kermit Balo s1/s2. (-) s3/m/r/g Lungs:  Fair ae.On PRVC Abdomen:  Distended, some focal superficial bruising, dec BS Musculoskeletal:  No bony abnormalitiies Skin:  Bruising all over extensor surfaces and belly, Gr 1 edema  LABS:  PULMONARY  Recent Labs Lab 07/29/15 0219  07/30/15 0414 07/30/15 1741  07/31/15 0340 07/31/15 0406 07/31/15 1620 08/01/15 0330 08/01/15 0336 08/02/15 0410  PHART 7.386  --  7.391 7.377  --  7.484*  --   --   --  7.473*  --   PCO2ART 38.6  --  38.3 45.6*  --  33.4*  --   --   --  34.1*  --   PO2ART 81.0  --  96.0 269.0*  --  92.0  --   --   --  95.0  --   HCO3 23.2  --  23.2 26.8*  --  25.0*  --   --   --  24.9*  --   TCO2 24  --  24 28  --  26  --   --   --  26  --   O2SAT 96.0  < > 97.0 100.0  < > 98.0 64.8 64.8 63.7 98.0 62.4  < > = values in this interval not displayed.  CBC  Recent Labs Lab 08/01/15 0345 08/02/15 0407 08/02/15 0520  HGB 8.6* 7.1* 8.6*  HCT 26.7* 22.9* 27.9*  WBC 12.4* 12.9* 16.3*  PLT 126* 127* 130*    COAGULATION  Recent Labs Lab 07/27/15 0310  INR 1.32    CARDIAC  No results for input(s): TROPONINI in the last 168 hours. No results for input(s): PROBNP in the last 168 hours.   CHEMISTRY  Recent Labs Lab 07/30/15 0403  07/31/15 0333   07/31/15 1605 08/01/15 0345 08/01/15 1553 08/02/15 0407 08/02/15 0520  NA 134*  < > 133*  --  133* 132* 134* 138 135  K 4.6  < > 4.5  --  4.3 4.3 4.3 3.3* 4.3  CL 104  < > 101  --  102 100* 102 113* 101  CO2 23  < > 22  --  22 23 24  20* 25  GLUCOSE 117*  < > 115*  --  155* 117* 140* 127* 148*  BUN 48*  < > 49*  --  54* 50* 48* 41* 48*  CREATININE 2.15*  < > 2.04*  --  2.20* 1.95* 1.81* 1.61* 1.93*  CALCIUM 8.5*  < > 8.2*  --  8.0* 8.1* 8.0* 6.4* 8.2*  MG 2.7*  --  2.6*  --   --  2.6*  --  2.0 2.6*  PHOS 5.7*  < >  --   < > 5.2* 5.1* 4.4 3.8 4.8*  < > = values in this interval not displayed. Estimated Creatinine Clearance: 44 mL/min (by C-G formula based on Cr of 1.93).   LIVER  Recent Labs Lab 07/27/15 0310  07/30/15 0403  07/31/15 0333 07/31/15 1605 08/01/15 0345 08/01/15 1553 08/02/15 0407 08/02/15 0520  AST 84*  < > 179*  --  200*  --  285*  --  224* 287*  ALT 187*  < > 92*  --  87*  --  101*  --  77* 101*  ALKPHOS 195*  < > 565*  --  647*  --  877*  --  760* 1066*  BILITOT 38.4*  < > 46.5*  --  45.2*  --  43.4*  --  QUESTIONABLE RESULTS, RECOMMEND RECOLLECT TO VERIFY 41.9*  PROT 5.4*  < > 5.6*  --  5.3*  --  5.5*  --  4.1* 5.3*  ALBUMIN 1.9*  < > 2.1*  < > 1.9* 1.7* 1.8* 1.7* 1.4* 1.7*  INR 1.32  --   --   --   --   --   --   --   --   --   < > = values in this interval not displayed.   INFECTIOUS No results for input(s): LATICACIDVEN, PROCALCITON in the last 168 hours.   ENDOCRINE CBG (last 3)   Recent Labs  08/01/15 2345 08/02/15 0328 08/02/15 0752  GLUCAP 146* 142* 151*         IMAGING x48h  - image(s) personally  visualized  -   highlighted in bold Dg Chest Port 1 View  08/02/2015  CLINICAL DATA:  Shortness of breath EXAM: PORTABLE CHEST 1 VIEW COMPARISON:  August 01, 2015 FINDINGS: Endotracheal tube tip is 5.6 cm above the carina. Central catheter tips are in the superior vena cava. Feeding tube tip is below the diaphragm. No pneumothorax. Left  chest tube no longer present. There are small pleural effusions bilaterally with mild interstitial edema. No airspace consolidation. There is, however, left base atelectasis. Heart is enlarged. The pulmonary vascularity is within normal limits. No adenopathy evident. Patient is status post coronary artery bypass grafting. IMPRESSION: Cardiomegaly with mild interstitial edema. Suspect a degree of congestive heart failure. No airspace consolidation. There is, however, left base atelectasis. Tube and catheter positions as described without pneumothorax. Electronically Signed   By: Lowella Grip III M.D.   On: 08/02/2015 08:36   Dg Chest Port 1 View  08/01/2015  CLINICAL DATA:  Chest tube in place. EXAM: PORTABLE CHEST 1 VIEW COMPARISON:  07/31/2015 FINDINGS: Tracheostomy tube remains in place. Enteric tube courses into the left upper abdomen with tip not imaged. Sternotomy wires and skin staples remain in place. Right subclavian catheter terminates over the upper SVC and left subclavian catheter terminates over the mid SVC, both unchanged. A chest tube remains in the left lung base. The cardiac silhouette remains enlarged. Lung volumes are slightly greater than on yesterday's examination. There is mild pulmonary vascular congestion which has mildly improved. No overt alveolar edema is identified. No sizable pleural effusion or definite pneumothorax is identified, with the left lateral lung base slightly incompletely imaged. IMPRESSION: 1. Unchanged position of support devices. 2. Mild improvement of pulmonary vascular congestion. Electronically Signed   By: Logan Bores M.D.   On: 08/01/2015 07:51       DISCUSSION: 70 y/o male s/p redo CABG 6/8 complicated by significant post operative bleeding in the setting of thrombocytopenia brought back to the Surgical ICU on 6/9 with hypoxemic respiratory failure.  He has baseline ESRD and DM2.  ASSESSMENT / PLAN:  PULMONARY A: Acute hypoxemic respiratory  failure 2/2  Pulm edema, possible HCAP S/P Trache 6/20 by Hyman Bible Post operative pleural bleeding   - on PRVC  40% fio2, peep 5  P:   PSV as toleraed but not ready for PRVC Cont vent support. Daily PST as tolerated. Sedation being cut down. Consider making maintenance mode high PS given asynchrony w PRVC Intermittent CXR Chest tubes per thoracic surgery, replaced CT per surgery.  CARDIOVASCULAR A:  Severe Cardiogenic and hemorrhagic shock CAD s/p redo CABG IABP d/c'd on 6/18   on epi and levo gt P:  Cont levophed and epi drips, wean as able - goal sbp > 100 Tele monitoring. Amiodarone off  RENAL A:   ESRD CKD V   - on CRRT P:   Continue CVVHD per renal -- tolerating volume removal on pressors Renal following Replace electrolytes as indicated.  GASTROINTESTINAL A:   Jaundice and coagulopathy due to shocked liver. P:   OG tube TPN per pharmacy due to high residuals. H2 blocker for stress ulcer prophylaxis Abdominal U/S negative for obstruction, likely shocked liver.   HEMATOLOGIC A:   Diffuse oozing due to coagulopathy from blood loss, thrombocytopenia. Improved.  P:  Transfuse per TCTS guidelines. Trend CBC, coags.   INFECTIOUS A:   ?HCAP - doubt, respiratory culture negative Rare fungus in resp cx > suspect contaminant P:   Empiric abx primaxin (D10) and vanco (  D8); plan 10 days Hold off on fungal coverage at this point  ENDOCRINE A:   DM2   P:   Monitor glucose Insulin gtt per TCTS post op protocol  NEUROLOGIC A:   Sedation needed  for vent synchrony P:   RASS goal: -2 Work to decrease sedation as able.   FAMILY  - Updates:  No family bedside 6/23 , 08/02/15 - Inter-disciplinary family meet or Palliative Care meeting due by:     The patient is critically ill with multiple organ systems failure and requires high complexity decision making for assessment and support, frequent evaluation and titration of therapies, application of advanced  monitoring technologies and extensive interpretation of multiple databases.   Critical Care Time devoted to patient care services described in this note is  30  Minutes. This time reflects time of care of this signee Dr Brand Males. This critical care time does not reflect procedure time, or teaching time or supervisory time of PA/NP/Med student/Med Resident etc but could involve care discussion time    Dr. Brand Males, M.D., [facility].C.P Pulmonary and Critical Care Medicine Staff Physician [place] Pager: [phone], If no answer or between  15:00h - 7:00h: call [phone]  08/02/2015 9:38 AM

## 2015-08-02 NOTE — Progress Notes (Signed)
eLink Physician-Brief Progress Note Patient Name: Joseph [name] DOB: 1945/08/12 MRN: [id number]   Date of Service  08/02/2015  HPI/Events of Note  Patient continuing to require fentanyl & percent for sedation to promote ventilator synchrony. This results in hypotension. Patient continuing on epinephrine and Levophed infusions.   eICU Interventions  1. Increasing upper limit for Levophed infusion 2. Starting Precedex infusion      Intervention Category Major Interventions: Respiratory failure - evaluation and management;Delirium, psychosis, severe agitation - evaluation and management  Tera Partridge 08/02/2015, 1:55 AM

## 2015-08-02 NOTE — Care Management Important Message (Signed)
Important Message  Patient Details  Name: Joseph [name] MRN: [id number] Date of Birth: Jul 17, 1945   Medicare Important Message Given:  Yes    [name] May 08/02/2015, 10:32 AM

## 2015-08-03 ENCOUNTER — Inpatient Hospital Stay (HOSPITAL_COMMUNITY): Payer: Medicare Other

## 2015-08-03 DIAGNOSIS — J9311 Primary spontaneous pneumothorax: Secondary | ICD-10-CM

## 2015-08-03 DIAGNOSIS — K7460 Unspecified cirrhosis of liver: Secondary | ICD-10-CM

## 2015-08-03 LAB — COMPREHENSIVE METABOLIC PANEL
ALT: 95 U/L — ABNORMAL HIGH (ref 17–63)
AST: 262 U/L — ABNORMAL HIGH (ref 15–41)
Albumin: 1.8 g/dL — ABNORMAL LOW (ref 3.5–5.0)
Alkaline Phosphatase: 999 U/L — ABNORMAL HIGH (ref 38–126)
Anion gap: 7 (ref 5–15)
BUN: 48 mg/dL — ABNORMAL HIGH (ref 6–20)
CO2: 23 mmol/L (ref 22–32)
Calcium: 8.3 mg/dL — ABNORMAL LOW (ref 8.9–10.3)
Chloride: 102 mmol/L (ref 101–111)
Creatinine, Ser: 1.85 mg/dL — ABNORMAL HIGH (ref 0.61–1.24)
GFR calc Af Amer: 42 mL/min — ABNORMAL LOW (ref >60)
GFR calc non Af Amer: 36 mL/min — ABNORMAL LOW (ref >60)
Glucose, Bld: 158 mg/dL — ABNORMAL HIGH (ref 65–99)
Potassium: 4.1 mmol/L (ref 3.5–5.1)
Sodium: 132 mmol/L — ABNORMAL LOW (ref 135–145)
Total Bilirubin: 43.7 mg/dL (ref 0.3–1.2)
Total Protein: 5.3 g/dL — ABNORMAL LOW (ref 6.5–8.1)

## 2015-08-03 LAB — RENAL FUNCTION PANEL
ALBUMIN: 1.8 g/dL — AB (ref 3.5–5.0)
ANION GAP: 8 (ref 5–15)
Albumin: 1.8 g/dL — ABNORMAL LOW (ref 3.5–5.0)
Anion gap: 9 (ref 5–15)
BUN: 49 mg/dL — ABNORMAL HIGH (ref 6–20)
BUN: 46 mg/dL — AB (ref 6–20)
CHLORIDE: 103 mmol/L (ref 101–111)
CHLORIDE: 101 mmol/L (ref 101–111)
CO2: 22 mmol/L (ref 22–32)
CO2: 23 mmol/L (ref 22–32)
CREATININE: 1.85 mg/dL — AB (ref 0.61–1.24)
Calcium: 8.2 mg/dL — ABNORMAL LOW (ref 8.9–10.3)
Calcium: 8.5 mg/dL — ABNORMAL LOW (ref 8.9–10.3)
Creatinine, Ser: 1.82 mg/dL — ABNORMAL HIGH (ref 0.61–1.24)
GFR calc Af Amer: 42 mL/min — ABNORMAL LOW (ref >60)
GFR, EST AFRICAN AMERICAN: 43 mL/min — AB (ref >60)
GFR, EST NON AFRICAN AMERICAN: 37 mL/min — AB (ref >60)
GFR, EST NON AFRICAN AMERICAN: 36 mL/min — AB (ref >60)
GLUCOSE: 147 mg/dL — AB (ref 65–99)
Glucose, Bld: 155 mg/dL — ABNORMAL HIGH (ref 65–99)
POTASSIUM: 4.1 mmol/L (ref 3.5–5.1)
POTASSIUM: 4.6 mmol/L (ref 3.5–5.1)
Phosphorus: 4.5 mg/dL (ref 2.5–4.6)
Phosphorus: 5.2 mg/dL — ABNORMAL HIGH (ref 2.5–4.6)
Sodium: 133 mmol/L — ABNORMAL LOW (ref 135–145)
Sodium: 133 mmol/L — ABNORMAL LOW (ref 135–145)

## 2015-08-03 LAB — CULTURE, BLOOD (SINGLE): Culture: NO GROWTH

## 2015-08-03 LAB — POCT I-STAT 3, ART BLOOD GAS (G3+)
ACID-BASE EXCESS: 2 mmol/L (ref 0.0–2.0)
Acid-Base Excess: 2 mmol/L (ref 0.0–2.0)
BICARBONATE: 27 meq/L — AB (ref 20.0–24.0)
Bicarbonate: 25.8 mEq/L — ABNORMAL HIGH (ref 20.0–24.0)
Bicarbonate: 24.4 mEq/L — ABNORMAL HIGH (ref 20.0–24.0)
O2 SAT: 98 %
O2 SAT: 100 %
O2 Saturation: 98 %
PCO2 ART: 40.3 mmHg (ref 35.0–45.0)
PH ART: 7.476 — AB (ref 7.350–7.450)
PO2 ART: 94 mmHg (ref 80.0–100.0)
PO2 ART: 353 mmHg — AB (ref 80.0–100.0)
Patient temperature: 98
TCO2: 27 mmol/L (ref 0–100)
TCO2: 26 mmol/L (ref 0–100)
TCO2: 28 mmol/L (ref 0–100)
pCO2 arterial: 34.9 mmHg — ABNORMAL LOW (ref 35.0–45.0)
pCO2 arterial: 37 mmHg (ref 35.0–45.0)
pH, Arterial: 7.423 (ref 7.350–7.450)
pH, Arterial: 7.433 (ref 7.350–7.450)
pO2, Arterial: 92 mmHg (ref 80.0–100.0)

## 2015-08-03 LAB — C DIFFICILE QUICK SCREEN W PCR REFLEX
C Diff antigen: POSITIVE — AB
C Diff toxin: NEGATIVE

## 2015-08-03 LAB — CARBOXYHEMOGLOBIN
Carboxyhemoglobin: 2.1 % — ABNORMAL HIGH (ref 0.5–1.5)
Methemoglobin: 0.5 % (ref 0.0–1.5)
O2 Saturation: 61.2 %
Total hemoglobin: 8.1 g/dL — ABNORMAL LOW (ref 13.5–18.0)

## 2015-08-03 LAB — GLUCOSE, CAPILLARY
GLUCOSE-CAPILLARY: 102 mg/dL — AB (ref 65–99)
GLUCOSE-CAPILLARY: 125 mg/dL — AB (ref 65–99)
GLUCOSE-CAPILLARY: 150 mg/dL — AB (ref 65–99)
GLUCOSE-CAPILLARY: 157 mg/dL — AB (ref 65–99)
GLUCOSE-CAPILLARY: 162 mg/dL — AB (ref 65–99)
GLUCOSE-CAPILLARY: 169 mg/dL — AB (ref 65–99)
Glucose-Capillary: 64 mg/dL — ABNORMAL LOW (ref 65–99)

## 2015-08-03 LAB — CBC
HCT: 26.2 % — ABNORMAL LOW (ref 39.0–52.0)
Hemoglobin: 8.1 g/dL — ABNORMAL LOW (ref 13.0–17.0)
MCH: 29 pg (ref 26.0–34.0)
MCHC: 30.9 g/dL (ref 30.0–36.0)
MCV: 93.9 fL (ref 78.0–100.0)
Platelets: 129 10*3/uL — ABNORMAL LOW (ref 150–400)
RBC: 2.79 MIL/uL — ABNORMAL LOW (ref 4.22–5.81)
RDW: 23.1 % — ABNORMAL HIGH (ref 11.5–15.5)
WBC: 14.8 10*3/uL — ABNORMAL HIGH (ref 4.0–10.5)

## 2015-08-03 LAB — PROTIME-INR
INR: 1.58 — AB (ref 0.00–1.49)
Prothrombin Time: 18.9 seconds — ABNORMAL HIGH (ref 11.6–15.2)

## 2015-08-03 LAB — MAGNESIUM: Magnesium: 2.7 mg/dL — ABNORMAL HIGH (ref 1.7–2.4)

## 2015-08-03 LAB — VANCOMYCIN, TROUGH: Vancomycin Tr: 20 ug/mL (ref 15–20)

## 2015-08-03 LAB — AMMONIA: Ammonia: 18 umol/L (ref 9–35)

## 2015-08-03 LAB — CALCIUM, IONIZED: Calcium, Ionized, Serum: 4.1 mg/dL — ABNORMAL LOW (ref 4.5–5.6)

## 2015-08-03 MED ORDER — DEXTROSE 10 % IV SOLN
INTRAVENOUS | Status: DC
  Administered 2015-08-03: 09:00:00 via INTRAVENOUS

## 2015-08-03 MED ORDER — LIDOCAINE HCL (PF) 1 % IJ SOLN
INTRAMUSCULAR | Status: AC
  Administered 2015-08-03: 11:00:00
  Filled 2015-08-03: qty 30

## 2015-08-03 MED ORDER — FLUCONAZOLE IN SODIUM CHLORIDE 100-0.9 MG/50ML-% IV SOLN
100.0000 mg | Freq: Once | INTRAVENOUS | Status: AC
Start: 2015-08-03 — End: 2015-08-03
  Administered 2015-08-03: 100 mg via INTRAVENOUS
  Filled 2015-08-03: qty 50

## 2015-08-03 MED ORDER — METRONIDAZOLE 500 MG PO TABS
500.0000 mg | ORAL_TABLET | Freq: Three times a day (TID) | ORAL | Status: DC
Start: 2015-08-03 — End: 2015-08-04
  Administered 2015-08-03 – 2015-08-04 (×4): 500 mg via ORAL
  Filled 2015-08-03 (×4): qty 1

## 2015-08-03 MED ORDER — DEXTROSE 50 % IV SOLN
25.0000 mL | Freq: Once | INTRAVENOUS | Status: AC
  Administered 2015-08-03: 25 mL via INTRAVENOUS

## 2015-08-03 MED ORDER — DEXTROSE 50 % IV SOLN
INTRAVENOUS | Status: AC
  Filled 2015-08-03: qty 50

## 2015-08-03 MED ORDER — VANCOMYCIN HCL IN DEXTROSE 1-5 GM/200ML-% IV SOLN
1000.0000 mg | INTRAVENOUS | Status: DC
  Administered 2015-08-03 – 2015-08-05 (×3): 1000 mg via INTRAVENOUS
  Filled 2015-08-03 (×4): qty 200

## 2015-08-03 NOTE — Progress Notes (Addendum)
[facility] Progress Note    Assessment/ Plan:   70 y.o. male with ESRD who was admitted 06/21/2015 with CP and subsequent redo CABG complicated by blood loss req transfusion and hemodynamic instability -s/p balloon pump, VDRF, shock, CRRT  1. CAD- complicated redo CABG- continues to be on pressors; high  Levophed doses. 2. ESRD -TTS [facility]-on CRRT now since 6/17. Has left forearm AVF -Increased fluids pre filter fluid to prevent clotting. S/p 500cc bolus on 6/25. Continue to  keep even with CRRT. - bladder scan as req this AM --> 93m which is not unexpected. 3. Anemia- has required transfusions- fairly stable h/h today. 4. HTN/volume- CRRT with volume removal (keep net even); fortunately able to at least keep even over past 24hrs. 5. Inc LFTs/hyperbilirubinemia  6. Alkalosis, improved off citrate, problematic usage with liver disease  Subjective:   Continues to be on the vent with worsening pressor requirements. Levophed still at 238m Also on Epinephrine and Vasopressin CVP mildly elevated. No events overnight except drop in HR early this AM.   Objective:   BP 115/84 mmHg  Pulse 69  Temp(Src) 98.2 F (36.8 C) (Oral)  Resp 31  Ht _0  (1.803 m)  Wt 97 kg (213 lb 13.5 oz)  BMI 29.84 kg/m2  SpO2 100%  Intake/Output Summary (Last 24 hours) at 08/03/15 0739 Last data filed at 08/03/15 0700  Gross per 24 hour  Intake 4336.68 ml  Output   4473 ml  Net -136.32 ml   Weight change: 0.4 kg (14.1 oz)  Physical Exam: General appearance: On vent Extremities: trace in the lower extremities. Jaundiced  RRR No BS but soft abdomen Rales posteriorly  Imaging: Dg Chest Port 1 View  08/02/2015  CLINICAL DATA:  Shortness of breath EXAM: PORTABLE CHEST 1 VIEW COMPARISON:  August 01, 2015 FINDINGS: Endotracheal tube tip is 5.6 cm above the carina. Central catheter tips are in the superior vena cava. Feeding tube tip is below the diaphragm. No  pneumothorax. Left chest tube no longer present. There are small pleural effusions bilaterally with mild interstitial edema. No airspace consolidation. There is, however, left base atelectasis. Heart is enlarged. The pulmonary vascularity is within normal limits. No adenopathy evident. Patient is status post coronary artery bypass grafting. IMPRESSION: Cardiomegaly with mild interstitial edema. Suspect a degree of congestive heart failure. No airspace consolidation. There is, however, left base atelectasis. Tube and catheter positions as described without pneumothorax. Electronically Signed   By: WiLowella GripII M.D.   On: 08/02/2015 08:36    Labs: BMET  Recent Labs Lab 07/31/15 1605 08/01/15 0345 08/01/15 1553 08/02/15 0407 08/02/15 0520 08/02/15 1408 08/03/15 0435 08/03/15 0436  NA 133* 132* 134* 138 135 134* 132* 133*  K 4.3 4.3 4.3 3.3* 4.3 4.4 4.1 4.1  CL 102 100* 102 113* 101 103 102 103  CO2 _1 20* 25 20* 23 22  GLUCOSE 155* 117* 140* 127* 148* 154* 158* 155*  BUN 54* 50* 48* 41* 48* 51* 48* 49*  CREATININE 2.20* 1.95* 1.81* 1.61* 1.93* 1.92* 1.85* 1.82*  CALCIUM 8.0* 8.1* 8.0* 6.4* 8.2* 8.6* 8.3* 8.2*  PHOS 5.2* 5.1* 4.4 3.8 4.8* 4.9*  --  4.5   CBC  Recent Labs Lab 08/01/15 0345 08/02/15 0407 08/02/15 0520 08/03/15 0435  WBC 12.4* 12.9* 16.3* 14.8*  HGB 8.6* 7.1* 8.6* 8.1*  HCT 26.7* 22.9* 27.9* 26.2*  MCV 90.5 93.5 93.6 93.9  PLT 126* 127* 130* 129*    Medications:    .  sodium chloride   Intravenous Once  . antiseptic oral rinse  7 mL Mouth Rinse 10 times per day  . bisacodyl  10 mg Rectal Daily  . chlorhexidine gluconate (SAGE KIT)  15 mL Mouth Rinse BID  . darbepoetin (ARANESP) injection - NON-DIALYSIS  100 mcg Subcutaneous Q Fri-1800  . feeding supplement (NEPRO CARB STEADY)  1,000 mL Per Tube Q24H  . feeding supplement (PRO-STAT SUGAR FREE 64)  60 mL Per Tube QID  . fluconazole (DIFLUCAN) IV  100 mg Intravenous Once  . imipenem-cilastatin   500 mg Intravenous Q8H  . insulin aspart  0-9 Units Subcutaneous Q4H  . insulin glargine  25 Units Subcutaneous BID  . levalbuterol  1.25 mg Nebulization Q6H  . pantoprazole (PROTONIX) IV  40 mg Intravenous Q24H  . propofol  50 mL Intravenous Once  . QUEtiapine  50 mg Oral BID  . sodium chloride flush  10-40 mL Intracatheter Q12H    Seen on CRRT _0  Pre/Post 350/321m/hr [id number] Qb1503min  Even UF (net) Utilizing Rt SCV temp.  JamOtelia SanteeD 08/03/2015, 7:39 AM

## 2015-08-03 NOTE — Progress Notes (Signed)
11 Days Post-Op Procedure(s) (LRB): STERNAL CLOSURE WITH PUMP STANDBY (N/A) TRANSESOPHAGEAL ECHOCARDIOGRAM (TEE) (N/A) Subjective: Patient more alert today chest x-ray improved after right chest tube placement for 20% pneumothorax Tracheostomy exchanged today for cuff leak Patient remains deeply jaundiced, GI consult placed to help with management of acute on chronic liver failure  Objective: Vital signs in last 24 hours: Temp:  [96.5 F (35.8 C)-98.9 F (37.2 C)] 96.5 F (35.8 C) (06/27 1227) Pulse Rate:  [28-86] 77 (06/27 1515) Cardiac Rhythm:  [-] Normal sinus rhythm (06/27 1200) Resp:  [13-37] 19 (06/27 1515) BP: (106-155)/(30-41) 155/41 mmHg (06/27 1214) SpO2:  [100 %] 100 % (06/27 1516) Arterial Line BP: (86-218)/(24-60) 154/40 mmHg (06/27 1515) FiO2 (%):  [40 %] 40 % (06/27 1516) Weight:  [213 lb 13.5 oz (97 kg)] 213 lb 13.5 oz (97 kg) (06/27 0500)  Hemodynamic parameters for last 24 hours: CVP:  [6 mmHg-13 mmHg] 11 mmHg  Intake/Output from previous day: 06/26 0701 - 06/27 0700 In: 4336.7 [I.V.:2466.7; NG/GT:1520; IV Piggyback:350] Out: 4696 [Stool:725] Intake/Output this shift: Total I/O In: 1210 [I.V.:600; NG/GT:460; IV Piggyback:150] Out: 1746 [Other:1746]  Patient is more engaged today attempts to mouth words on trach Coarse breath sounds Sternal incision clean and dry Abdomen soft Extremities warm with minimal edema  Lab Results:  Recent Labs  08/02/15 0520 08/03/15 0435  WBC 16.3* 14.8*  HGB 8.6* 8.1*  HCT 27.9* 26.2*  PLT 130* 129*   BMET:  Recent Labs  08/03/15 0435 08/03/15 0436  NA 132* 133*  K 4.1 4.1  CL 102 103  CO2 23 22  GLUCOSE 158* 155*  BUN 48* 49*  CREATININE 1.85* 1.82*  CALCIUM 8.3* 8.2*    PT/INR: No results for input(s): LABPROT, INR in the last 72 hours. ABG    Component Value Date/Time   PHART 7.423 08/03/2015 1227   HCO3 24.4* 08/03/2015 1227   TCO2 26 08/03/2015 1227   ACIDBASEDEF 1.0 08/02/2015 2005   O2SAT  98.0 08/03/2015 1227   CBG (last 3)   Recent Labs  08/03/15 0803 08/03/15 1224 08/03/15 1304  GLUCAP 125* 64* 102*    Assessment/Plan: S/P Procedure(s) (LRB): STERNAL CLOSURE WITH PUMP STANDBY (N/A) TRANSESOPHAGEAL ECHOCARDIOGRAM (TEE) (N/A) Continue CRT Continue broad-spectrum antibiotics for probable pneumonia, C. difficile colitis, candida in sputum Wean vasopressin as patient appears to be recovering from last septic episode.   LOS: 28 days    Joseph [name] 08/03/2015

## 2015-08-03 NOTE — Consult Note (Signed)
Consultation  Referring Provider:   Dr. Kerby Less   Primary Care Physician:  Marden Noble, MD Primary Gastroenterologist: Dr. Virgel Manifold, Dr. Jeanann Lewandowsky  Reason for Consultation: Elevated LFT             HPI:   Joseph [name] is a 70 y.o. male with a complicated medical history includinghx of IDDM. HLD. MI/CAD; s/p 2001 CABG, s/p 08/2014 DES.maintained on low dose ASA and Plavix in the past. CHF with EF 35%,  STEMI 2017,Stage 4 CKD, ESRD and on HD , Prostate cancer, s/p radiation seeds. Anemia, on po iron. Cholelithiasis. Gallstone pancreatitis 04/2013, Hx thrombocytopenia and hepatosplenomegaly and MGUS.     Current multiple day hospital admission for unstable angina, patient is s/p 2 vessel CABG on 08/04/2015 and same day re- exploration for ongoing bleeding after time of procedure. Patient did require multiple blood products at that time for stabilization. 07/21/2015 had sternal closure. 6/20-trach placed.  07/31/15 signs of sepsis noted with increased WBC count, increased pressor requirements deterioration in mental status and worsening pulmonary status. Patient on broad spectrum abx. (see below for more details)  SIGNIFICANT EVENTS: 6/8 Redo CABG 6/8 take back to OR emergently for diffuse bleeding 6/9 - Rx CRRT 6/10 - start paralysis continuous 6/14 - dc paralysis 6/16 back to OR to close sternum 6/17 - dc IABP 6/20 - s/p trach YACOUB 07/28/15 - post pyloric tube 07/31/15 - rt subclavian trialysis catgh 6/25: > norepi up to 40, epi down to 6. CVVHD running. He tolerates PSV, is tachypneic no matter what vent mode mode we use. Note rare fungus on resp cx 6/26: on trach. On CRRT;keeping even On pressors - epi 6. Levophed 40-50 for sbp > 100. On precedex. Not following commands. Off precerdex wob increases. On PRVC; RN says does well on day time SBT. VERY JAUNDICED   We were consulted today regarding elevated LFT's. These appear to have steadily increased since the day of  surgery on 07/31/2015. On 07/14/15 Bili was normal at 0.7 and other LFT's were also normal. As of today, tbili 43.7, alk phos 1066 yesterday, 999 today, ast/alt 287/101 yesterday, 262/95 today.    07/27/15 PT 16.5, INR 1.32  Platelets stable 120-130's  U/S 07/22/15- Diffuse GB wall thickening, ascites. CBD 5.34m.  Question reactive gallbladder wall thickening, consider acalculous cholecystitis.  Patient is on ventilator and is sedated at time of interview, so history is garnered from previous notes.  Past GI Workup: 06/2013-Liver bx-showed early bridging fibrosis and no cirrhosis and increased iron stores-done for thrombocytopenia and hepatosplenomegaly 06/2013-Colonoscopy-done for diarrhea: adenomatous and hyperplastic polyps and sig divertciulosis  Garnered from previous records. Past Medical History  Diagnosis Date  . Hypertensive heart disease   . Hypercholesterolemia   . Prostate cancer ([facility]   . Hepatosplenomegaly 2015    fibrosis, no cirrhosis on 06/2013 liver biopsy  . Thrombocytopenia ([facility] [address] 2015  . Depression with anxiety   . IDDM (insulin dependent diabetes mellitus) ([place]     INSULIN DEPENDENT  . GERD (gastroesophageal reflux disease)   . History of shingles   . Ischemic cardiomyopathy     a. echo 08/06/2014: EF 25-30%, multiple WMA, mod DD, PASP 49 mm Hg;  b. 12/2014 Echo: EF 30-35%, diff HK, mild MR.; Echo 03/2015 - ? LV thrombus (not seen in 3/27 - but EF ? 40-45%.   . CKD (chronic kidney disease), stage IV ([place]     a. previously on dialysis 11/2013->04/2014;  b. 12/2014 Dialysis  resumed.  . Coronary artery disease, occlusive     a. 2001 MI s/p 4v CABG (LIMA-LAD, SVG-D2, SVG-OM1, SVG-right PDA, SVG-right PL1); b. cath 07/30/2014 vein grafts all down, patent LIMA to LAD, significant LM and ost LCx dx; c. 08/07/2014 PCI Synergy DES 2.5 mm x 18 mm to dist LM and ost LCx, rec lifelong DAPT.; 02/2015 - Patent LIMA-LAD with L-R Collaterals, 60% ISR of LM-Cx stent (non-occlusive)  . Chronic  combined systolic and diastolic CHF, NYHA class 2 ([name])     a. 08/2013 Echo: EF 40-45%, impaired relaxation;  b. 07/2014 Echo: EF 25-30%;  c. 12/2014 Echo: EF 30-35%, diff HK, mild MR.  . Gallstone pancreatitis 2015  . ESRD (end stage renal disease) [facility])     Past Surgical History  Procedure Laterality Date  . Cardiac surgery    . Shoulder arthroscopy    . Penile prosthesis implant    . Coronary angioplasty    . Coronary artery bypass graft  2001    LIMA-LAD, SVG-D2, SVG-OM 1, SVG-r PDA, SVG-r PL 1) --> as of January 2017, all grafts besides LIMA occluded.  . Colonoscopy    . Liver biopsy    . Av fistula placement Left 2015  . Cardiac catheterization N/A 07/30/2014    Procedure: Left Heart Cath and Cors/Grafts Angiography;  Surgeon: Dionisio David, MD;  Location: [place] [facility];  Service: Cardiovascular;  Laterality: N/A;  . Cardiac catheterization N/A 08/07/2014    Procedure: Coronary Stent Intervention;  Surgeon: Lorretta Harp, MD;  Location: [place] [facility];  Service: Cardiovascular;  Left Main-Ostial Circumflex PCI with Synergy DES 2.5 mm x 18 mm  . Cardiac catheterization N/A 03/09/2015    Procedure: Left Heart Cath and Coronary Angiography;  Surgeon: Sherren Mocha, MD;  Location: [place] [facility];  Service: Cardiovascular: 100% oLAD & pRCA, ~60% ISR in oCx. Patent LIMA-LAD with L-R collaterals to rPDA  . Transthoracic echocardiogram  07/2014-04/2015    a. echo 08/06/2014: EF 25-30%, multiple WMA, mod DD, PASP 49 mm Hg;  b. 12/2014 Echo: EF 30-35%, diff HK, mild MR.; Echo 03/2015 - ? LV thrombus (not seen in 3/27 - but EF ? 40-45%.   . Cardiac catheterization N/A 06/23/2015    Procedure: Left Heart Cath and Cors/Grafts Angiography;  Surgeon: Belva Crome, MD;  Location: [place] [facility];  Service: Cardiovascular;  Laterality: N/A;  . Exploration post operative open heart N/A 07/13/2015    Procedure: EXPLORATION POST OPERATIVE OPEN HEART;  Surgeon: Ivin Poot, MD;  Location:  [place];  Service: Open Heart Surgery;  Laterality: N/A;  . Sternal closure N/A 07/27/2015    Procedure: STERNAL CLOSURE WITH PUMP STANDBY;  Surgeon: Ivin Poot, MD;  Location: [place];  Service: Thoracic;  Laterality: N/A;  . Tee without cardioversion N/A 07/20/2015    Procedure: TRANSESOPHAGEAL ECHOCARDIOGRAM (TEE);  Surgeon: Ivin Poot, MD;  Location[facility];  Service: Thoracic;  Laterality: N/A;  . Coronary artery bypass graft N/A 07/29/2015    Procedure: REDO CORONARY ARTERY BYPASS GRAFTING (CABG) times two using right greater saphenous vein harvested by endovein, cryovein saphenous vein implant ;  Surgeon: Ivin Poot, MD;  Location: [place];  Service: Open Heart Surgery;  Laterality: N/A;  . Tee without cardioversion N/A 07/22/2015    Procedure: TRANSESOPHAGEAL ECHOCARDIOGRAM (TEE);  Surgeon: Ivin Poot, MD;  Location: [place];  Service: Open Heart Surgery;  Laterality: N/A;    Family History  Problem  Relation Age of Onset  . Acute myelogenous leukemia Brother   . CAD Brother   . Diabetes Mellitus II Brother   . Heart disease Father 54    CABG    Social History  Substance Use Topics  . Smoking status: Former Smoker    Quit date: 02/07/2008  . Smokeless tobacco: Former Systems developer  . Alcohol Use: No    Prior to Admission medications   Medication Sig Start Date End Date Taking? Authorizing Provider  aspirin EC 81 MG tablet Take 81 mg by mouth daily.   Yes Historical Provider, MD  calcium acetate (PHOSLO) 667 MG tablet Take 1,334 mg by mouth 3 (three) times daily with meals.  05/21/15  Yes Historical Provider, MD  carvedilol (COREG) 6.25 MG tablet Take 1 tablet (6.25 mg total) by mouth 2 (two) times daily with a meal. 03/11/15  Yes Brittainy Erie Noe, PA-C  furosemide (LASIX) 80 MG tablet Take 80 mg by mouth as needed for fluid.    Yes Historical Provider, MD  isosorbide mononitrate (IMDUR) 30 MG 24 hr tablet Take 30 mg by mouth daily. 06/24/15  Yes Historical Provider, MD    isosorbide mononitrate (IMDUR) 60 MG 24 hr tablet Take 60 mg by mouth daily.  04/01/15  Yes Historical Provider, MD  LANTUS SOLOSTAR 100 UNIT/ML Solostar Pen Inject 20 Units into the skin daily at 10 pm.  04/18/15  Yes Historical Provider, MD  lidocaine-prilocaine (EMLA) cream Apply 1 application topically Every Tuesday,Thursday,and Saturday with dialysis.  01/27/15  Yes Historical Provider, MD  naproxen sodium (ANAPROX) 220 MG tablet Take 220 mg by mouth 2 (two) times daily with a meal.   Yes Historical Provider, MD  nitroGLYCERIN (NITROLINGUAL) 0.4 MG/SPRAY spray Place 1 spray under the tongue every 5 (five) minutes x 3 doses as needed for chest pain. Do not use together with sublingual nitro 08/08/14  Yes Almyra Deforest, PA  NOVOLOG MIX 70/30 FLEXPEN (70-30) 100 UNIT/ML FlexPen Inject 25 Units into the skin daily with breakfast.  05/06/15  Yes Historical Provider, MD  pregabalin (LYRICA) 75 MG capsule Take 1 capsule (75 mg total) by mouth daily. Patient taking differently: Take 75 mg by mouth 2 (two) times daily.  01/04/15  Yes Nita Sells, MD  ranolazine (RANEXA) 500 MG 12 hr tablet Take 1 tablet (500 mg total) by mouth 2 (two) times daily. 02/25/15  Yes Rogelia Mire, NP  rosuvastatin (CRESTOR) 20 MG tablet Take 1 tablet (20 mg total) by mouth at bedtime. 02/25/15  Yes Rogelia Mire, NP  ticagrelor (BRILINTA) 90 MG TABS tablet Take 1 tablet (90 mg total) by mouth 2 (two) times daily. 12/11/14  Yes Brett Canales, PA-C    Current Facility-Administered Medications  Medication Dose Route Frequency Provider Last Rate Last Dose  . 0.9 %  sodium chloride infusion   Intravenous Continuous Ivin Poot, MD 20 mL/hr at 08/01/15 1900    . 0.9 %  sodium chloride infusion   Intravenous Once [employer], MD      . antiseptic oral rinse solution (CORINZ)  7 mL Mouth Rinse 10 times per day Ivin Poot, MD   7 mL at 08/03/15 1414  . chlorhexidine gluconate (SAGE KIT) (PERIDEX) 0.12 %  solution 15 mL  15 mL Mouth Rinse BID Ivin Poot, MD   15 mL at 08/03/15 0830  . Darbepoetin Alfa (ARANESP) injection 100 mcg  100 mcg Subcutaneous Q Fri-1800 Corliss Parish, MD   100  mcg at 07/30/15 1714  . dextrose 10 % infusion   Intravenous Continuous Ivin Poot, MD 40 mL/hr at 08/03/15 0900    . EPINEPHrine (ADRENALIN) 4 mg in dextrose 5 % 250 mL (0.016 mg/mL) infusion  0.5-15 mcg/min Intravenous Titrated Ivin Poot, MD 30 mL/hr at 08/03/15 1124 8 mcg/min at 08/03/15 1124  . feeding supplement (NEPRO CARB STEADY) liquid 1,000 mL  1,000 mL Per Tube Q24H Grace Isaac, MD   Stopped at 08/03/15 [phone]  . feeding supplement (PRO-STAT SUGAR FREE 64) liquid 60 mL  60 mL Per Tube QID Ivin Poot, MD   60 mL at 08/03/15 1408  . fentaNYL (SUBLIMAZE) 2,500 mcg in sodium chloride 0.9 % 250 mL (10 mcg/mL) infusion  100-300 mcg/hr Intravenous Continuous Juanito Doom, MD 7.5 mL/hr at 08/03/15 0830 75 mcg/hr at 08/03/15 0830  . fentaNYL (SUBLIMAZE) bolus via infusion 50 mcg  50 mcg Intravenous Q30 min PRN Juanito Doom, MD   50 mcg at 08/03/15 1043  . fentaNYL (SUBLIMAZE) injection 100 mcg  100 mcg Intravenous Once PRN Juanito Doom, MD      . Gerhardt's butt cream   Topical PRN Ivin Poot, MD      . heparin injection 1,000-6,000 Units  1,000-6,000 Units CRRT PRN Estanislado Emms, MD   2,400 Units at 07/30/15 1510  . imipenem-cilastatin (PRIMAXIN) 500 mg in sodium chloride 0.9 % 100 mL IVPB  500 mg Intravenous Q8H Ivin Poot, MD   500 mg at 08/03/15 1405  . insulin aspart (novoLOG) injection 0-9 Units  0-9 Units Subcutaneous Q4H Ivin Poot, MD   2 Units at 08/03/15 0450  . insulin glargine (LANTUS) injection 25 Units  25 Units Subcutaneous BID Ivin Poot, MD   25 Units at 08/03/15 [phone]  . levalbuterol (XOPENEX) nebulizer solution 1.25 mg  1.25 mg Nebulization Q6H Ivin Poot, MD   1.25 mg at 08/03/15 0849  . metoprolol (LOPRESSOR) injection 2.5-5 mg   2.5-5 mg Intravenous Q2H PRN Donielle Liston Alba, PA-C      . metroNIDAZOLE (FLAGYL) tablet 500 mg  500 mg Oral TID Ivin Poot, MD   500 mg at 08/03/15 0941  . midazolam (VERSED) 50 mg in sodium chloride 0.9 % 50 mL (1 mg/mL) infusion  2-10 mg/hr Intravenous Continuous Juanito Doom, MD   Stopped at 07/26/15 [phone]  . midazolam (VERSED) injection 2 mg  2 mg Intravenous Q1H PRN Nani Skillern, PA-C   1 mg at 08/03/15 1016  . milrinone (PRIMACOR) 20 MG/100 ML (0.2 mg/mL) infusion  0.125 mcg/kg/min Intravenous Continuous Ivin Poot, MD 3.5 mL/hr at 08/03/15 0700 0.125 mcg/kg/min at 08/03/15 0700  . norepinephrine (LEVOPHED) 16 mg in dextrose 5 % 250 mL (0.064 mg/mL) infusion  0-80 mcg/min Intravenous Titrated Javier Glazier, MD 32.8 mL/hr at 08/03/15 1346 35 mcg/min at 08/03/15 1346  . ondansetron (ZOFRAN) injection 4 mg  4 mg Intravenous Q6H PRN Donielle Liston Alba, PA-C      . pantoprazole (PROTONIX) injection 40 mg  40 mg Intravenous Q24H Ivin Poot, MD   40 mg at 08/03/15 0943  . prismasol BGK 4/2.5 5,000 mL dialysis replacement fluid   CRRT Continuous Corliss Parish, MD 300 mL/hr at 08/02/15 1929    . prismasol BGK 4/2.5 5,000 mL dialysis replacement fluid   CRRT Continuous Estanislado Emms, MD 350 mL/hr at 08/03/15 0520    . prismasol BGK 4/2.5 5,000 mL  dialysis solution   CRRT Continuous Corliss Parish, MD 1,500 mL/hr at 08/03/15 0845    . QUEtiapine (SEROQUEL) tablet 50 mg  50 mg Oral BID Ivin Poot, MD   50 mg at 08/03/15 [phone]  . sodium chloride 0.9 % primer fluid for CRRT   CRRT PRN Corliss Parish, MD      . sodium chloride flush (NS) 0.9 % injection 10-40 mL  10-40 mL Intracatheter PRN Ivin Poot, MD      . sodium chloride flush (NS) 0.9 % injection 10-40 mL  10-40 mL Intracatheter Q12H Ivin Poot, MD   10 mL at 08/02/15 2149  . sodium chloride flush (NS) 0.9 % injection 10-40 mL  10-40 mL Intracatheter PRN Ivin Poot, MD      .  traMADol Veatrice Bourbon) tablet 50 mg  50 mg Oral Q12H PRN Donielle Liston Alba, PA-C      . vasopressin (PITRESSIN) 40 Units in sodium chloride 0.9 % 250 mL (0.16 Units/mL) infusion  0.03 Units/min Intravenous Continuous Ivin Poot, MD 11.3 mL/hr at 08/03/15 1124 0.03 Units/min at 08/03/15 1124    Allergies as of 06/10/2015 - Review Complete 06/22/2015  Allergen Reaction Noted  . Sulfa antibiotics Hives and Itching 04/02/2013  . Morphine and related  11/03/2014     Review of Systems:    Unable to complete due to patient's mental status    Physical Exam:  Vital signs in last 24 hours: Temp:  [96.5 F (35.8 C)-98.9 F (37.2 C)] 96.5 F (35.8 C) (06/27 1227) Pulse Rate:  [28-86] 86 (06/27 1214) Resp:  [13-37] 24 (06/27 1214) BP: (106-155)/(30-84) 155/41 mmHg (06/27 1214) SpO2:  [100 %] 100 % (06/27 1214) Arterial Line BP: (86-218)/(24-60) 124/37 mmHg (06/27 1200) FiO2 (%):  [40 %] 40 % (06/27 1214) Weight:  [213 lb 13.5 oz (97 kg)] 213 lb 13.5 oz (97 kg) (06/27 0500) Last BM Date: 08/01/15 General:  Critically ill on vent and CVVHD Head:  Normocephalic and atraumatic. Eyes:   Icteric Ears:  Unable to assess Neck:  Supple Throat: Trach in place Lungs: On vent Heart: Normal S1, S2. No MRG. Regular rate and rhythm. Median Sternotomy with dressing Abdomen:  Distended, multiple bruises present, decreased bs all four quadrants Rectal:  Not performed.  Msk:  Symmetrical without gross deformities. Peripheral pulses intact.  Extremities:  Without edema, no deformity or joint abnormality. Normal ROM, normal sensation. Neurologic:  Sedated, able to nod head yes when I explained I was going to examine him Skin: Jaundiced, Multiple bruises Psychiatric: Sedated   LAB RESULTS:  Recent Labs  08/02/15 0407 08/02/15 0520 08/03/15 0435  WBC 12.9* 16.3* 14.8*  HGB 7.1* 8.6* 8.1*  HCT 22.9* 27.9* 26.2*  PLT 127* 130* 129*   BMET  Recent Labs  08/02/15 1408 08/03/15 0435  08/03/15 0436  NA 134* 132* 133*  K 4.4 4.1 4.1  CL 103 102 103  CO2 20* 23 22  GLUCOSE 154* 158* 155*  BUN 51* 48* 49*  CREATININE 1.92* 1.85* 1.82*  CALCIUM 8.6* 8.3* 8.2*   LFT  Recent Labs  08/03/15 0435 08/03/15 0436  PROT 5.3*  --   ALBUMIN 1.8* 1.8*  AST 262*  --   ALT 95*  --   ALKPHOS 999*  --   BILITOT 43.7*  --    PT/INR No results for input(s): LABPROT, INR in the last 72 hours.  STUDIES: Dg Chest Port 1 View  08/03/2015  CLINICAL DATA:  Right  side chest tube. EXAM: PORTABLE CHEST 1 VIEW COMPARISON:  08/03/2015 FINDINGS: Right chest tube remains in place, unchanged. No visible pneumothorax. Support devices are stable. Prior CABG. Cardiomegaly with vascular congestion and bibasilar atelectasis. IMPRESSION: No visible right pneumothorax currently. Cardiomegaly with vascular congestion and bibasilar atelectasis. Electronically Signed   By: Rolm Baptise M.D.   On: 08/03/2015 12:19   Dg Chest Port 1 View  08/03/2015  CLINICAL DATA:  Tracheostomy.  Respiratory distress. EXAM: PORTABLE CHEST 1 VIEW COMPARISON:  08/02/2015. FINDINGS: Heart is enlarged. Tracheostomy good position. Prior CABG. Interstitial edema may be slightly improved. LEFT chest tube has been removed.  There is no LEFT pneumothorax. Along the RIGHT lateral chest wall, RIGHT lung base, and RIGHT lung apex, there is an estimated 10% pneumothorax not visible on previous radiographs. Dual lumen catheter remains unchanged in position, proximal SVC from RIGHT subclavian approach. IMPRESSION: Interval development of a new RIGHT pneumothorax, 10% estimated. These results were called by telephone at the time of interpretation on 08/03/2015 at 7:50 am to Dr. Ivin Poot , who verbally acknowledged these results. Electronically Signed   By: Staci Righter M.D.   On: 08/03/2015 07:51   Dg Chest Port 1 View  08/02/2015  CLINICAL DATA:  Shortness of breath EXAM: PORTABLE CHEST 1 VIEW COMPARISON:  August 01, 2015 FINDINGS:  Endotracheal tube tip is 5.6 cm above the carina. Central catheter tips are in the superior vena cava. Feeding tube tip is below the diaphragm. No pneumothorax. Left chest tube no longer present. There are small pleural effusions bilaterally with mild interstitial edema. No airspace consolidation. There is, however, left base atelectasis. Heart is enlarged. The pulmonary vascularity is within normal limits. No adenopathy evident. Patient is status post coronary artery bypass grafting. IMPRESSION: Cardiomegaly with mild interstitial edema. Suspect a degree of congestive heart failure. No airspace consolidation. There is, however, left base atelectasis. Tube and catheter positions as described without pneumothorax. Electronically Signed   By: Lowella Grip III M.D.   On: 08/02/2015 08:36     PREVIOUS ENDOSCOPIES:            See HPI   Impression / Plan:  Impression: 1. Elevated LFT's and hyperbilirubinemia- Steady increase since day of CABG on 08/05/2015-Consider shock liver vs sequela of multiple blood transfusions vs acalculous cholecystitis 2. CAD-complicated redo CABG-continues on pressors; high Levophed doses 3. Anemia- due to massive post-op bleeding, has required multiple transfusions (26 prbcs, 6 cryo, 14 ffp and 15 plt) 4. HTN/volume-CRRT with volume removal 5. Alkalosis 6. ESRD on CVVH 7. H/o thrombocytopenia 8. H/o previous liver dz- 06/2013 liver bx showing fibrosis but no cirrhosis; per notes from [facility], h/o increased iron stores, but amyloid stains on the liver and on rectal bx were negative; tested neg for hep C and HIV. ANA neg. Hep B surface antigen neg.  Plan: 1. Ordered repeat PT/INR 2. Will discuss any further recommendations with Dr. Silverio Decamp, please await her recommendations  Thank you for your kind consultation, we will continue to follow.  Lavone Nian Rankin Coolman  08/03/2015, 3:10 PM Pager #: [phone]

## 2015-08-03 NOTE — Brief Op Note (Signed)
07/06/2015 - 08/01/2015  11:16 AM  PATIENT:  Eduardo Osier  70 y.o. male  PRE-OPERATIVE DIAGNOSIS: postop spontaneous Right pneumothorax , 25 %  POST-OPERATIVE DIAGNOSIS: same  PROCEDURE:   SURGEON:  Surgeon(s) and Role:    * Ivin Poot, MD - Primary  PHYSICIAN ASSISTANT: none  ASSISTANTS: none   ANESTHESIA:   local 1 % lidocaine  EBL:  Total I/O In: 280 [NG/GT:230; IV Piggyback:50] Out: 796 [Other:796]  BLOOD ADMINISTERED:none  DRAINS: 28 F  Chest Tube(s) in the R pleural space   LOCAL MEDICATIONS USED:  XYLOCAINE  and Amount: 8 ml  SPECIMEN:  No Specimen  DISPOSITION OF SPECIMEN:  N/A  COUNTS:  YES  TOURNIQUET:  * No tourniquets in log *  DICTATION: .Dragon Dictation  PLAN OF CARE: continue ICU care  PATIENT DISPOSITION:  cont ICU care   Delay start of Pharmacological VTE agent (>24hrs) due to surgical blood loss or risk of bleeding: not applicable

## 2015-08-03 NOTE — Progress Notes (Addendum)
ANTIBIOTIC CONSULT NOTE - F/U  Pharmacy Consult for Vancomycin, Primaxin Indication: Sepsis  Allergies  Allergen Reactions  . Sulfa Antibiotics Hives and Itching  . Morphine And Related     Makes patient feel weird    Patient Measurements: Height: 5\' 11"  (180.3 cm) Weight: 213 lb 13.5 oz (97 kg) IBW/kg (Calculated) : 75.3 Adjusted Body Weight:   Vital Signs: Temp: 96.5 F (35.8 C) (06/27 1227) Temp Source: Axillary (06/27 1227) BP: 155/41 mmHg (06/27 1214) Pulse Rate: 86 (06/27 1214) Intake/Output from previous day: 06/26 0701 - 06/27 0700 In: 4336.7 [I.V.:2466.7; NG/GT:1520; IV Piggyback:350] Out: 4696 [Stool:725] Intake/Output from this shift: Total I/O In: 280 [NG/GT:230; IV Piggyback:50] Out: 946 [Other:946]  Labs:  Recent Labs  08/02/15 0407 08/02/15 0520 08/02/15 1408 08/03/15 0435 08/03/15 0436  WBC 12.9* 16.3*  --  14.8*  --   HGB 7.1* 8.6*  --  8.1*  --   PLT 127* 130*  --  129*  --   CREATININE 1.61* 1.93* 1.92* 1.85* 1.82*   Estimated Creatinine Clearance: 46.8 mL/min (by C-G formula based on Cr of 1.82). No results for input(s): VANCOTROUGH, VANCOPEAK, VANCORANDOM, GENTTROUGH, GENTPEAK, GENTRANDOM, TOBRATROUGH, TOBRAPEAK, TOBRARND, AMIKACINPEAK, AMIKACINTROU, AMIKACIN in the last 72 hours.   Microbiology:   Medical History: Past Medical History  Diagnosis Date  . Hypertensive heart disease   . Hypercholesterolemia   . Prostate cancer ([place])   . Hepatosplenomegaly 2015    fibrosis, no cirrhosis on 06/2013 liver biopsy  . Thrombocytopenia ([place]) 2015  . Depression with anxiety   . IDDM (insulin dependent diabetes mellitus) ([place])     INSULIN DEPENDENT  . GERD (gastroesophageal reflux disease)   . History of shingles   . Ischemic cardiomyopathy     a. echo 08/06/2014: EF 25-30%, multiple WMA, mod DD, PASP 49 mm Hg;  b. 12/2014 Echo: EF 30-35%, diff HK, mild MR.; Echo 03/2015 - ? LV thrombus (not seen in 3/27 - but EF ? 40-45%.   . CKD (chronic  kidney disease), stage IV ([place])     a. previously on dialysis 11/2013->04/2014;  b. 12/2014 Dialysis resumed.  . Coronary artery disease, occlusive     a. 2001 MI s/p 4v CABG (LIMA-LAD, SVG-D2, SVG-OM1, SVG-right PDA, SVG-right PL1); b. cath 07/30/2014 vein grafts all down, patent LIMA to LAD, significant LM and ost LCx dx; c. 08/07/2014 PCI Synergy DES 2.5 mm x 18 mm to dist LM and ost LCx, rec lifelong DAPT.; 02/2015 - Patent LIMA-LAD with L-R Collaterals, 60% ISR of LM-Cx stent (non-occlusive)  . Chronic combined systolic and diastolic CHF, NYHA class 2 ([place])     a. 08/2013 Echo: EF 40-45%, impaired relaxation;  b. 07/2014 Echo: EF 25-30%;  c. 12/2014 Echo: EF 30-35%, diff HK, mild MR.  . Gallstone pancreatitis 2015  . ESRD (end stage renal disease) ([place])    Admit Complaint: 70 yo M with known CAD admitted 5/29 with CP > NSTEMI>CABG 6/8. back to OR emergently 6/8 for diffuse bleeding. Receive Factor VII and has received multiple transfusion.  Anticoagulation: SCDs, CBC remains relatively stable.  Infectious Disease: Sepsis. Hypothermic 96.5, WBC 14.8, continues on CRRT  6/8 Vanc >>  6/17 Primaxin >> 6/26 Fluconazole>6/27 (by MD, got 2 doses) 6/27 Flagyl>> 6/9 Zosyn>>6/17  6/23 VR 22, Vanco 1g x 1 6/24 VR 21 - Vanco 1g IV x 1 6/25: Vanco 1g IV x 1 (30.5 hr later) 6/26: Vanco 1g IV x 1 (22 hr later, RN gave 4 hrs earlier than  scheduled?) 6/27: VR _____________  6/10 TA: ngF 6/7 surg mrsa pcr: neg 6/16 bld - ngtd 6/16 resp cx - ngF 6/22 BCx2: ngtd 6/22 RCx: rare yeast 6/26: Cdiff  antigen+, toxin negative 6/26: Trach aspirate: Moderate yeast  Goal of Therapy:  Vancomycin trough level 15-20 mcg/ml  Plan:  Please note pt is on PPI IV and not famotidine (off since 6/18) Primaxin 500mg  IV q8hr Vancomycin "trough" tonight 24hrs after previous dose.-redose if level <20 Schedule Fluconazole per pharmacy??--100mg  IV q24h  Cassanda Walmer S. Alford Highland, PharmD, BCPS Clinical Staff  Pharmacist Pager [phone]  Eilene Ghazi Stillinger 08/03/2015,12:53 PM

## 2015-08-03 NOTE — Progress Notes (Signed)
PULMONARY / CRITICAL CARE MEDICINE   Name: Joseph [name] MRN: [id number] DOB: 1945/08/26    ADMISSION DATE:  07/01/2015 CONSULTATION DATE:  07/16/2015  REFERRING MD:  Nils Pyle  CHIEF COMPLAINT:  Post cardiothoracic surgery ventilator management  subj   70 y/o male with ESRD and significant CAD (CABG 2001, DES 2016, EF 40-45%) was admitted on 5/29 with NSTEMI and had persistent chest pain during his hospitalization so he was taken for a redo CABG on 6/8 (SVG to diag and SVG to OM, IABP placement and CVL placement).  Post operatively he had excessive bleeding from all chest drains (pleural and mediastinal) so he was taken back to the OR on 6/9 for re-exploration.  PCCM was consulted for ventilator management.     STUDIES:  6/14 TTE >EF 30-35%  CULTURES: Sputum 6/10 >> neg  ANTIBIOTICS: 6/8 Vanc >>6/16; 6/18 >> 6/26 ? Renal dose 6/10 Zosyn >>6/16 Primaxin 6/16>>> Flagyl 6/27>> Diflucan 1 dose 6/27> 6/27     LINES/TUBES: Left forearm fistula 6/8 Multiple chest tubes (mediastinal and pleural bilateral) >> 6/8 CVC left femoral vein >>out 6/8 R radial arterial line >>6/17>>>6/17>>> 6/8 L IJ [place] >>6/17 L [place] TLC 6/16>>> R [place] HD catheter 6/23 >>  Trach 6/20 >>   SIGNIFICANT EVENTS: 6/8 Redo CABG 6/8 take back to OR emergently for diffuse bleeding 6/9 - Rx CRRT 6/10 - start paralysis continuous 6/14 - dc paralysis 6/16 back to OR to close sternum 6/17 - dc IABP 6/20 - s/p trach  YACOUB 07/28/15 - post pyloric tube 07/31/15  - rt subclavian trialysis catgh 6/25:  > norepi up to 40, epi down to 6. CVVHD running. He tolerates PSV, is tachypneic no matter what vent mode mode we use. Note rare fungus on resp cx 6/26: on trach. On CRRT;keeping even On pressors - epi 6. Levophed 40-50 for sbp > 100. On precedex. Not following commands. Off precerdex wob increases. On PRVC; RN says does well on day time SBT. VERY JAUNDICED   . SUBJECTIVE/OVERNIGHT/INTERVAL HX 6/27  - LOS  28 days. 10% rt ptx and Dr Lucianne Lei Tright placed chest tube.  CRT contnues.  Per RN on levophed, epi, vasipresson and milrinone. Cuff leak with trach +   VITAL SIGNS: BP 106/30 mmHg  Pulse 72  Temp(Src) 97 F (36.1 C) (Oral)  Resp 27  Ht 5\' 11"  (1.803 m)  Wt 97 kg (213 lb 13.5 oz)  BMI 29.84 kg/m2  SpO2 100%  HEMODYNAMICS: CVP:  [6 mmHg-12 mmHg] 10 mmHg  VENTILATOR SETTINGS: Vent Mode:  [-] PRVC FiO2 (%):  [40 %] 40 % Set Rate:  [16 bmp] 16 bmp Vt Set:  [500 mL] 500 mL PEEP:  [5 cmH20] 5 cmH20 Plateau Pressure:  [14 cmH20-25 cmH20] 14 cmH20  INTAKE / OUTPUT: I/O last 3 completed shifts: In: 6457.6 [I.V.:3627.6; NG/GT:2080; IV Piggyback:750] Out: [id number] [Other:5815; Stool:1000]  PHYSICAL EXAMINATION: General:  Critically ill on vent, CVVHD, jaundiced. Icteric.  Neuro:  Sedated, no other movement HEENT:  Scleral icterus.  TRACH in place Cardiovascular:  median sternotomy dressing dr. Kermit Balo s1/s2. (-) s3/m/r/g Lungs:  Fair ae.On PRVC Abdomen:  Distended, some focal superficial bruising, dec BS Musculoskeletal:  No bony abnormalitiies Skin:  Bruising all over extensor surfaces and belly, Gr 1 edema  LABS:  PULMONARY  Recent Labs Lab 07/31/15 0340  08/01/15 0336 08/02/15 0410 08/02/15 1414 08/02/15 2005 08/03/15 0423 08/03/15 0432  PHART 7.484*  --  7.473*  --  7.428 7.411 7.476*  --  PCO2ART 33.4*  --  34.1*  --  37.3 36.8 34.9*  --   PO2ART 92.0  --  95.0  --  103.0* 112* 92.0  --   HCO3 25.0*  --  24.9*  --  24.5* 23.1 25.8*  --   TCO2 26  --  26  --  26 24.2 27  --   O2SAT 98.0  < > 98.0 62.4 98.0 98.6 98.0 61.2  < > = values in this interval not displayed.  CBC  Recent Labs Lab 08/02/15 0407 08/02/15 0520 08/03/15 0435  HGB 7.1* 8.6* 8.1*  HCT 22.9* 27.9* 26.2*  WBC 12.9* 16.3* 14.8*  PLT 127* 130* 129*    COAGULATION No results for input(s): INR in the last 168 hours.  CARDIAC  No results for input(s): TROPONINI in the last 168 hours. No  results for input(s): PROBNP in the last 168 hours.   CHEMISTRY  Recent Labs Lab 07/31/15 [id number]  08/01/15 0345 08/01/15 1553 08/02/15 0407 08/02/15 0520 08/02/15 1408 08/03/15 0435 08/03/15 0436  NA 133*  < > 132* 134* 138 135 134* 132* 133*  K 4.5  < > 4.3 4.3 3.3* 4.3 4.4 4.1 4.1  CL 101  < > 100* 102 113* 101 103 102 103  CO2 22  < > 23 24 20* 25 20* 23 22  GLUCOSE 115*  < > 117* 140* 127* 148* 154* 158* 155*  BUN 49*  < > 50* 48* 41* 48* 51* 48* 49*  CREATININE 2.04*  < > 1.95* 1.81* 1.61* 1.93* 1.92* 1.85* 1.82*  CALCIUM 8.2*  < > 8.1* 8.0* 6.4* 8.2* 8.6* 8.3* 8.2*  MG 2.6*  --  2.6*  --  2.0 2.6*  --  2.7*  --   PHOS  --   < > 5.1* 4.4 3.8 4.8* 4.9*  --  4.5  < > = values in this interval not displayed. Estimated Creatinine Clearance: 46.8 mL/min (by C-G formula based on Cr of 1.82).   LIVER  Recent Labs Lab 07/31/15 0333  08/01/15 0345  08/02/15 0407 08/02/15 0520 08/02/15 1408 08/03/15 0435 08/03/15 0436  AST 200*  --  285*  --  224* 287*  --  262*  --   ALT 87*  --  101*  --  77* 101*  --  95*  --   ALKPHOS 647*  --  877*  --  760* 1066*  --  999*  --   BILITOT 45.2*  --  43.4*  --  QUESTIONABLE RESULTS, RECOMMEND RECOLLECT TO VERIFY 41.9*  --  43.7*  --   PROT 5.3*  --  5.5*  --  4.1* 5.3*  --  5.3*  --   ALBUMIN 1.9*  < > 1.8*  < > 1.4* 1.7* 2.0* 1.8* 1.8*  < > = values in this interval not displayed.   INFECTIOUS No results for input(s): LATICACIDVEN, PROCALCITON in the last 168 hours.   ENDOCRINE CBG (last 3)   Recent Labs  08/02/15 2346 08/03/15 0405 08/03/15 0803  GLUCAP 169* 157* 125*         IMAGING x48h  - image(s) personally visualized  -   highlighted in bold Dg Chest Port 1 View  08/03/2015  CLINICAL DATA:  Tracheostomy.  Respiratory distress. EXAM: PORTABLE CHEST 1 VIEW COMPARISON:  08/02/2015. FINDINGS: Heart is enlarged. Tracheostomy good position. Prior CABG. Interstitial edema may be slightly improved. LEFT chest tube has  been removed.  There is no LEFT pneumothorax. Along  the RIGHT lateral chest wall, RIGHT lung base, and RIGHT lung apex, there is an estimated 10% pneumothorax not visible on previous radiographs. Dual lumen catheter remains unchanged in position, proximal SVC from RIGHT subclavian approach. IMPRESSION: Interval development of a new RIGHT pneumothorax, 10% estimated. These results were called by telephone at the time of interpretation on 08/03/2015 at 7:50 am to Dr. Ivin Poot , who verbally acknowledged these results. Electronically Signed   By: Staci Righter M.D.   On: 08/03/2015 07:51   Dg Chest Port 1 View  08/02/2015  CLINICAL DATA:  Shortness of breath EXAM: PORTABLE CHEST 1 VIEW COMPARISON:  August 01, 2015 FINDINGS: Endotracheal tube tip is 5.6 cm above the carina. Central catheter tips are in the superior vena cava. Feeding tube tip is below the diaphragm. No pneumothorax. Left chest tube no longer present. There are small pleural effusions bilaterally with mild interstitial edema. No airspace consolidation. There is, however, left base atelectasis. Heart is enlarged. The pulmonary vascularity is within normal limits. No adenopathy evident. Patient is status post coronary artery bypass grafting. IMPRESSION: Cardiomegaly with mild interstitial edema. Suspect a degree of congestive heart failure. No airspace consolidation. There is, however, left base atelectasis. Tube and catheter positions as described without pneumothorax. Electronically Signed   By: Lowella Grip III M.D.   On: 08/02/2015 08:36       DISCUSSION: 70 y/o male s/p redo CABG 6/8 complicated by significant post operative bleeding in the setting of thrombocytopenia brought back to the Surgical ICU on 6/9 with hypoxemic respiratory failure.  He has baseline ESRD and DM2.  ASSESSMENT / PLAN:  PULMONARY A: Acute hypoxemic respiratory failure 2/2  Pulm edema, possible HCAP S/P Trache 6/20 by H[facility] Bible Post operative pleural  bleeding   - on PRVC  40% fio2, peep 5. Curff leak + via trach. New Rt ptx - chest tube by CVTS  P:   Asked Dr Titus Mould to eval for trach change due to cuff leak Cont vent support. Daily PST as tolerated. Sedation being cut down. Consider making maintenance mode high PS given asynchrony w PRVC Intermittent CXR Chest tubes per thoracic surgery, replaced CT per surgery.  CARDIOVASCULAR A:  Severe Cardiogenic and hemorrhagic shock CAD s/p redo CABG IABP d/c'd on 6/18   on multiple pressors P:  Cont levophed and epi , vasio and milrinone drips, wean as able - goal sbp > 100 Tele monitoring. Amiodarone off  RENAL A:   ESRD CKD V   - on CRRT P:   Continue CVVHD per renal -- tolerating volume removal on pressors Renal following Replace electrolytes as indicated.  GASTROINTESTINAL A:   Jaundice and coagulopathy due to shocked liver. P:   OG tube TPN per pharmacy due to high residuals. H2 blocker for stress ulcer prophylaxis Abdominal U/S negative for obstruction, likely shocked liver.   HEMATOLOGIC A:   Diffuse oozing due to coagulopathy from blood loss, thrombocytopenia. Improved.  P:  Transfuse per TCTS guidelines. Trend CBC, coags.   INFECTIOUS A:   ?HCAP - doubt, respiratory culture negative Rare fungus in resp cx > suspect contaminant P:   Empiric abx primaxin (D11) and vanco (D9);    ENDOCRINE A:   DM2   P:   Monitor glucose Insulin gtt per TCTS post op protocol  NEUROLOGIC A:   Sedation needed  for vent synchrony P:   RASS goal: -2 to -3 Work to decrease sedation as able.   FAMILY  - Updates:  No  family bedside 6/23 , 08/02/15, 08/03/15 - Inter-disciplinary family meet or Palliative Care meeting due by:    GLOBAL Doubt he can survive past next few to several days   The patient is critically ill with multiple organ systems failure and requires high complexity decision making for assessment and support, frequent evaluation and titration of  therapies, application of advanced monitoring technologies and extensive interpretation of multiple databases.   Critical Care Time devoted to patient care services described in this note is  30  Minutes. This time reflects time of care of this signee Dr Brand Males. This critical care time does not reflect procedure time, or teaching time or supervisory time of PA/NP/Med student/Med Resident etc but could involve care discussion time    Dr. Brand Males, M.D., [facility].C.P Pulmonary and Critical Care Medicine Staff Physician [place] Pulmonary and Critical Care Pager: [phone], If no answer or between  15:00h - 7:00h: call [phone]  08/03/2015 11:22 AM

## 2015-08-03 NOTE — Progress Notes (Signed)
CCM NP removed # 6 Shiley due to slow cuff leak using a tube exchanger and replaced with a new #6 shiley with no complications. Pt stable at this time. Positive Color change noted on etco2, bilateral BS. Small amount of blood loss from pt coughing and re-insertion of new trach. Sutures removed from previous trach. Pt remains on 100% fio2 post procedural change and will be returned to previous settings in 10-15 minutes. RT will continue to monitor.

## 2015-08-03 NOTE — Progress Notes (Signed)
      [address]       [place] [address]             [phone]      Sedated on vent  Had chest tube placed earlier today. Also had trach changed out  BP 155/41 mmHg  Pulse 77  Temp(Src) 96.5 F (35.8 C) (Axillary)  Resp 19  Ht 5\' 11"  (1.803 m)  Wt 213 lb 13.5 oz (97 kg)  BMI 29.84 kg/m2  SpO2 100% Intermittently bradycardic- sometimes associated with suctioning  Intake/Output Summary (Last 24 hours) at 08/03/15 1805 Last data filed at 08/03/15 1700  Gross per 24 hour  Intake 3502.5 ml  Output   4481 ml  Net -978.5 ml    Remains on CVVHD  Joseph [name] C. Roxan Hockey, MD [facility] [phone]

## 2015-08-03 NOTE — Progress Notes (Signed)
ANTIBIOTIC CONSULT NOTE - Follow-Up  Pharmacy Consult for Vancomycin Indication: Sepsis, pneumonia  Allergies  Allergen Reactions  . Sulfa Antibiotics Hives and Itching  . Morphine And Related     Makes patient feel weird    Patient Measurements: Height: 5\' 11"  (180.3 cm) Weight: 213 lb 13.5 oz (97 kg) IBW/kg (Calculated) : 75.3 Adjusted Body Weight:   Vital Signs: Temp: 98.9 F (37.2 C) (06/27 1958) Temp Source: Oral (06/27 1958) BP: 155/41 mmHg (06/27 1214) Pulse Rate: 76 (06/27 2045) Intake/Output from previous day: 06/26 0701 - 06/27 0700 In: 4336.7 [I.V.:2466.7; NG/GT:1520; IV Piggyback:350] Out: 4696 [Stool:725] Intake/Output from this shift: Total I/O In: 376.3 [I.V.:226.3; NG/GT:150] Out: 239 [Other:169; Stool:50; Chest Tube:20]  Labs:  Recent Labs  08/02/15 0407 08/02/15 0520  08/03/15 0435 08/03/15 0436 08/03/15 1630  WBC 12.9* 16.3*  --  14.8*  --   --   HGB 7.1* 8.6*  --  8.1*  --   --   PLT 127* 130*  --  129*  --   --   CREATININE 1.61* 1.93*  < > 1.85* 1.82* 1.85*  < > = values in this interval not displayed. Estimated Creatinine Clearance: 46 mL/min (by C-G formula based on Cr of 1.85).  Recent Labs  08/03/15 2015  [place] 20     Microbiology:   Medical History: Past Medical History  Diagnosis Date  . Hypertensive heart disease   . Hypercholesterolemia   . Prostate cancer ([place])   . Hepatosplenomegaly 2015    fibrosis, no cirrhosis on 06/2013 liver biopsy  . Thrombocytopenia ([place]) 2015  . Depression with anxiety   . IDDM (insulin dependent diabetes mellitus) ([place])     INSULIN DEPENDENT  . GERD (gastroesophageal reflux disease)   . History of shingles   . Ischemic cardiomyopathy     a. echo 08/06/2014: EF 25-30%, multiple WMA, mod DD, PASP 49 mm Hg;  b. 12/2014 Echo: EF 30-35%, diff HK, mild MR.; Echo 03/2015 - ? LV thrombus (not seen in 3/27 - but EF ? 40-45%.   . CKD (chronic kidney disease), stage IV ([place])     a. previously  on dialysis 11/2013->04/2014;  b. 12/2014 Dialysis resumed.  . Coronary artery disease, occlusive     a. 2001 MI s/p 4v CABG (LIMA-LAD, SVG-D2, SVG-OM1, SVG-right PDA, SVG-right PL1); b. cath 07/30/2014 vein grafts all down, patent LIMA to LAD, significant LM and ost LCx dx; c. 08/07/2014 PCI Synergy DES 2.5 mm x 18 mm to dist LM and ost LCx, rec lifelong DAPT.; 02/2015 - Patent LIMA-LAD with L-R Collaterals, 60% ISR of LM-Cx stent (non-occlusive)  . Chronic combined systolic and diastolic CHF, NYHA class 2 ([place])     a. 08/2013 Echo: EF 40-45%, impaired relaxation;  b. 07/2014 Echo: EF 25-30%;  c. 12/2014 Echo: EF 30-35%, diff HK, mild MR.  . Gallstone pancreatitis 2015  . ESRD (end stage renal disease) ([place])    Admit Complaint: 70 yo M with known CAD admitted 5/29 with CP > NSTEMI>CABG 6/8. back to OR emergently 6/8 for diffuse bleeding. Receive Factor VII and has received multiple transfusion.  Anticoagulation: SCDs, CBC remains relatively stable.  Infectious Disease: Sepsis. Hypothermic 96.5, WBC 14.8, continues on CRRT  6/8 Vanc >>  6/17 Primaxin >> 6/26 Fluconazole>6/27 (by MD, got 2 doses) 6/27 Flagyl>> 6/9 Zosyn>>6/17  6/23 VR 22, Vanco 1g x 1 6/24 VR 21 - Vanco 1g IV x 1 6/25: Vanco 1g IV x 1 (30.5 hr later) 6/26: Vanco  1g IV x 1 (22 hr later, RN gave 4 hrs earlier than scheduled?) 6/27: VR 20  6/10 TA: ngF 6/7 surg mrsa pcr: neg 6/16 bld - ngtd 6/16 resp cx - ngF 6/22 BCx2: ngtd 6/22 RCx: rare yeast 6/26: Cdiff  antigen+, toxin negative 6/26: Trach aspirate: Moderate yeast  Goal of Therapy:  Vancomycin trough level 15-20 mcg/ml  Plan:  Will start Vancomycin 1gm IV q24h. Follow-up plans for antibiotic length of therapy.  Today is day #10 of abx post wound closure. Follow-up renal fxn as abx doses will change as patient transitions off CRRT.  [employer], Pharm.D., BCPS Clinical Pharmacist Pager [phone] 08/03/2015 9:17 PM

## 2015-08-04 ENCOUNTER — Inpatient Hospital Stay (HOSPITAL_COMMUNITY): Payer: Medicare Other

## 2015-08-04 DIAGNOSIS — J9621 Acute and chronic respiratory failure with hypoxia: Secondary | ICD-10-CM

## 2015-08-04 DIAGNOSIS — R17 Unspecified jaundice: Secondary | ICD-10-CM

## 2015-08-04 DIAGNOSIS — K7031 Alcoholic cirrhosis of liver with ascites: Secondary | ICD-10-CM

## 2015-08-04 DIAGNOSIS — Z951 Presence of aortocoronary bypass graft: Secondary | ICD-10-CM

## 2015-08-04 LAB — BLOOD GAS, ARTERIAL
Acid-Base Excess: 0.6 mmol/L (ref 0.0–2.0)
Bicarbonate: 24.7 mEq/L — ABNORMAL HIGH (ref 20.0–24.0)
Drawn by: 437071
FIO2: 0.4
MECHVT: 500 mL
O2 Saturation: 95.2 %
PEEP: 5 cmH2O
Patient temperature: 98.6
RATE: 16 resp/min
TCO2: 25.9 mmol/L (ref 0–100)
pCO2 arterial: 39.5 mmHg (ref 35.0–45.0)
pH, Arterial: 7.413 (ref 7.350–7.450)
pO2, Arterial: 76.4 mmHg — ABNORMAL LOW (ref 80.0–100.0)

## 2015-08-04 LAB — BILIRUBIN, DIRECT: Bilirubin, Direct: 28.9 mg/dL — ABNORMAL HIGH (ref 0.1–0.5)

## 2015-08-04 LAB — TYPE AND SCREEN
ABO/RH(D): A POS
Antibody Screen: NEGATIVE
Unit division: 0
Unit division: 0

## 2015-08-04 LAB — CBC
HCT: 27 % — ABNORMAL LOW (ref 39.0–52.0)
Hemoglobin: 8.2 g/dL — ABNORMAL LOW (ref 13.0–17.0)
MCH: 28.7 pg (ref 26.0–34.0)
MCHC: 30.4 g/dL (ref 30.0–36.0)
MCV: 94.4 fL (ref 78.0–100.0)
Platelets: 127 10*3/uL — ABNORMAL LOW (ref 150–400)
RBC: 2.86 MIL/uL — ABNORMAL LOW (ref 4.22–5.81)
RDW: 23.1 % — ABNORMAL HIGH (ref 11.5–15.5)
WBC: 17.8 10*3/uL — ABNORMAL HIGH (ref 4.0–10.5)

## 2015-08-04 LAB — GLUCOSE, CAPILLARY
GLUCOSE-CAPILLARY: 119 mg/dL — AB (ref 65–99)
GLUCOSE-CAPILLARY: 145 mg/dL — AB (ref 65–99)
GLUCOSE-CAPILLARY: 125 mg/dL — AB (ref 65–99)
GLUCOSE-CAPILLARY: 146 mg/dL — AB (ref 65–99)
Glucose-Capillary: 104 mg/dL — ABNORMAL HIGH (ref 65–99)
Glucose-Capillary: 114 mg/dL — ABNORMAL HIGH (ref 65–99)

## 2015-08-04 LAB — CARBOXYHEMOGLOBIN
Carboxyhemoglobin: 2.4 % — ABNORMAL HIGH (ref 0.5–1.5)
Methemoglobin: 0.4 % (ref 0.0–1.5)
O2 Saturation: 67.5 %
Total hemoglobin: 8.4 g/dL — ABNORMAL LOW (ref 13.5–18.0)

## 2015-08-04 LAB — MAGNESIUM: Magnesium: 2.6 mg/dL — ABNORMAL HIGH (ref 1.7–2.4)

## 2015-08-04 LAB — COMPREHENSIVE METABOLIC PANEL
ALT: 84 U/L — ABNORMAL HIGH (ref 17–63)
AST: 249 U/L — ABNORMAL HIGH (ref 15–41)
Albumin: 1.8 g/dL — ABNORMAL LOW (ref 3.5–5.0)
Alkaline Phosphatase: 1227 U/L — ABNORMAL HIGH (ref 38–126)
Anion gap: 9 (ref 5–15)
BUN: 43 mg/dL — ABNORMAL HIGH (ref 6–20)
CO2: 23 mmol/L (ref 22–32)
Calcium: 8.4 mg/dL — ABNORMAL LOW (ref 8.9–10.3)
Chloride: 100 mmol/L — ABNORMAL LOW (ref 101–111)
Creatinine, Ser: 1.66 mg/dL — ABNORMAL HIGH (ref 0.61–1.24)
GFR calc Af Amer: 48 mL/min — ABNORMAL LOW (ref >60)
GFR calc non Af Amer: 41 mL/min — ABNORMAL LOW (ref >60)
Glucose, Bld: 105 mg/dL — ABNORMAL HIGH (ref 65–99)
Potassium: 4 mmol/L (ref 3.5–5.1)
Sodium: 132 mmol/L — ABNORMAL LOW (ref 135–145)
Total Bilirubin: 45.2 mg/dL (ref 0.3–1.2)
Total Protein: 5.5 g/dL — ABNORMAL LOW (ref 6.5–8.1)

## 2015-08-04 LAB — CK TOTAL AND CKMB (NOT AT ARMC)
CK, MB: 11.8 ng/mL — ABNORMAL HIGH (ref 0.5–5.0)
Relative Index: 8.4 — ABNORMAL HIGH (ref 0.0–2.5)
Total CK: 141 U/L (ref 49–397)

## 2015-08-04 LAB — CLOSTRIDIUM DIFFICILE BY PCR: Toxigenic C. Difficile by PCR: POSITIVE — AB

## 2015-08-04 LAB — FERRITIN: Ferritin: >7500 ng/mL — ABNORMAL HIGH (ref 24–336)

## 2015-08-04 LAB — LACTIC ACID, PLASMA: Lactic Acid, Venous: 0.8 mmol/L (ref 0.5–1.9)

## 2015-08-04 LAB — PREPARE RBC (CROSSMATCH)

## 2015-08-04 LAB — CALCIUM, IONIZED: Calcium, Ionized, Serum: 4.9 mg/dL (ref 4.5–5.6)

## 2015-08-04 LAB — PHOSPHORUS: Phosphorus: 4.4 mg/dL (ref 2.5–4.6)

## 2015-08-04 MED ORDER — HYDROXYZINE HCL 25 MG PO TABS
25.0000 mg | ORAL_TABLET | Freq: Three times a day (TID) | ORAL | Status: DC | PRN
  Administered 2015-08-04 – 2015-08-07 (×6): 25 mg via ORAL
  Filled 2015-08-04 (×6): qty 1

## 2015-08-04 MED ORDER — VANCOMYCIN 50 MG/ML ORAL SOLUTION: 125.0000 mg | Freq: Four times a day (QID) | ORAL | Status: DC

## 2015-08-04 MED ORDER — ALBUMIN HUMAN 25 % IV SOLN
12.5000 g | Freq: Once | INTRAVENOUS | Status: AC
  Administered 2015-08-04: 12.5 g via INTRAVENOUS
  Filled 2015-08-04: qty 50

## 2015-08-04 MED ORDER — FLUCONAZOLE IN SODIUM CHLORIDE 100-0.9 MG/50ML-% IV SOLN
100.0000 mg | INTRAVENOUS | Status: DC
  Administered 2015-08-04 – 2015-08-10 (×7): 100 mg via INTRAVENOUS
  Filled 2015-08-04 (×7): qty 50

## 2015-08-04 MED ORDER — PANTOPRAZOLE SODIUM 40 MG PO PACK
40.0000 mg | PACK | Freq: Every day | ORAL | Status: DC
  Administered 2015-08-05 – 2015-08-07 (×3): 40 mg
  Filled 2015-08-04 (×3): qty 20

## 2015-08-04 MED ORDER — VANCOMYCIN 50 MG/ML ORAL SOLUTION
500.0000 mg | Freq: Four times a day (QID) | ORAL | Status: DC
  Administered 2015-08-04 – 2015-08-07 (×14): 500 mg via ORAL
  Filled 2015-08-04 (×29): qty 10

## 2015-08-04 NOTE — Progress Notes (Signed)
PULMONARY / CRITICAL CARE MEDICINE   Name: Joseph [name] MRN: [id number] DOB: Nov 20, 1945    ADMISSION DATE:  06/26/2015 CONSULTATION DATE:  07/16/2015  REFERRING MD:  Nils Pyle  CHIEF COMPLAINT:  Post cardiothoracic surgery ventilator management  subj   70 y/o male with ESRD and significant CAD (CABG 2001, DES 2016, EF 40-45%) was admitted on 5/29 with NSTEMI and had persistent chest pain during his hospitalization so he was taken for a redo CABG on 6/8 (SVG to diag and SVG to OM, IABP placement and CVL placement).  Post operatively he had excessive bleeding from all chest drains (pleural and mediastinal) so he was taken back to the OR on 6/9 for re-exploration.  PCCM was consulted for ventilator management.     STUDIES:  6/14 TTE >EF 30-35%  CULTURES: Sputum 6/10 >> neg  ANTIBIOTICS: 6/8 Vanc >>6/16; 6/18 >> 6/26 ? Renal dose 6/10 Zosyn >>6/16 Primaxin 6/16>>> Flagyl 6/27>> Diflucan 1 dose 6/27>      LINES/TUBES: Left forearm fistula 6/8 Multiple chest tubes (mediastinal and pleural bilateral) >> 6/8 CVC left femoral vein >>out 6/8 R radial arterial line >>6/17>>>6/17>>> 6/8 L IJ [place] >>6/17 L [place] TLC 6/16>>> R [place] HD catheter 6/23 >>  Trach 6/20 >> changed 6/27>>  SIGNIFICANT EVENTS: 6/8 Redo CABG 6/8 take back to OR emergently for diffuse bleeding 6/9 - Rx CRRT 6/10 - start paralysis continuous 6/14 - dc paralysis 6/16 back to OR to close sternum 6/17 - dc IABP 6/20 - s/p trach  YACOUB 07/28/15 - post pyloric tube 07/31/15  - rt subclavian trialysis catgh 6/25:  > norepi up to 40, epi down to 6. CVVHD running. He tolerates PSV, is tachypneic no matter what vent mode mode we use. Note rare fungus on resp cx 6/26: on trach. On CRRT;keeping even On pressors - epi 6. Levophed 40-50 for sbp > 100. On precedex. Not following commands. Off precerdex wob increases. On PRVC; RN says does well on day time SBT. VERY JAUNDICED 6/27 trach  exchange  . SUBJECTIVE/OVERNIGHT/INTERVAL HX 6/28  - LOS 29 days. 10% rt ptx and Dr Lucianne Lei Tright placed chest tube.  CRT contnues.  Per RN on levophed, epi, vasipresson and milrinone. Cuff leak with trach +, trach changed 6/27   VITAL SIGNS: BP 155/41 mmHg  Pulse 90  Temp(Src) 97.7 F (36.5 C) (Axillary)  Resp 36  Ht 5\' 11"  (1.803 m)  Wt 217 lb 6 oz (98.6 kg)  BMI 30.33 kg/m2  SpO2 100%  HEMODYNAMICS: CVP:  [8 mmHg-11 mmHg] 10 mmHg  VENTILATOR SETTINGS: Vent Mode:  [-] PRVC FiO2 (%):  [40 %-100 %] 40 % Set Rate:  [16 bmp] 16 bmp Vt Set:  [500 mL] 500 mL PEEP:  [5 cmH20] 5 cmH20 Plateau Pressure:  [5 cmH20-25 cmH20] 20 cmH20  INTAKE / OUTPUT: I/O last 3 completed shifts: In: 6042.7 [I.V.:3886.1; NG/GT:1406.7; IV Piggyback:750] Out: 6805 [Other:6005; Stool:450; Chest Tube:350]  PHYSICAL EXAMINATION: General:  Critically ill on vent, CVVHD, jaundiced. Icteric.  Neuro:  Sedated, follows commands HEENT:  Scleral icterus.  TRACH in place Cardiovascular:  median sternotomy dressing dr. Kermit Balo s1/s2. (-) s3/m/r/g Lungs:  Fair ae.On PRVC rr 40 Abdomen:  Distended, some focal superficial bruising, dec BS Musculoskeletal:  No bony abnormalitiies Skin:  Bruising all over extensor surfaces and belly, Gr 1 edema  LABS:  PULMONARY  Recent Labs Lab 08/02/15 2005 08/03/15 0423 08/03/15 0432 08/03/15 1227 08/03/15 2120 08/04/15 0355 08/04/15 0410  PHART 7.411 7.476*  --  7.423 7.433 7.413  --   PCO2ART 36.8 34.9*  --  37.0 40.3 39.5  --   PO2ART 112* 92.0  --  94.0 353.0* 76.4*  --   HCO3 23.1 25.8*  --  24.4* 27.0* 24.7*  --   TCO2 24.2 27  --  26 28 25.9  --   O2SAT 98.6 98.0 61.2 98.0 100.0 95.2 67.5    CBC  Recent Labs Lab 08/02/15 0520 08/03/15 0435 08/04/15 0400  HGB 8.6* 8.1* 8.2*  HCT 27.9* 26.2* 27.0*  WBC 16.3* 14.8* 17.8*  PLT 130* 129* 127*    COAGULATION  Recent Labs Lab 08/03/15 1815  INR 1.58*    CARDIAC  No results for input(s):  TROPONINI in the last 168 hours. No results for input(s): PROBNP in the last 168 hours.   CHEMISTRY  Recent Labs Lab 08/01/15 0345  08/02/15 0407 08/02/15 0520 08/02/15 1408 08/03/15 0435 08/03/15 0436 08/03/15 1630 08/04/15 0400  NA 132*  < > 138 135 134* 132* 133* 133* 132*  K 4.3  < > 3.3* 4.3 4.4 4.1 4.1 4.6 4.0  CL 100*  < > 113* 101 103 102 103 101 100*  CO2 23  < > 20* 25 20* 23 22 23 23   GLUCOSE 117*  < > 127* 148* 154* 158* 155* 147* 105*  BUN 50*  < > 41* 48* 51* 48* 49* 46* 43*  CREATININE 1.95*  < > 1.61* 1.93* 1.92* 1.85* 1.82* 1.85* 1.66*  CALCIUM 8.1*  < > 6.4* 8.2* 8.6* 8.3* 8.2* 8.5* 8.4*  MG 2.6*  --  2.0 2.6*  --  2.7*  --   --  2.6*  PHOS 5.1*  < > 3.8 4.8* 4.9*  --  4.5 5.2* 4.4  < > = values in this interval not displayed. Estimated Creatinine Clearance: 51.7 mL/min (by C-G formula based on Cr of 1.66).   LIVER  Recent Labs Lab 08/01/15 0345  08/02/15 0407 08/02/15 0520 08/02/15 1408 08/03/15 0435 08/03/15 0436 08/03/15 1630 08/03/15 1815 08/04/15 0400  AST 285*  --  224* 287*  --  262*  --   --   --  249*  ALT 101*  --  77* 101*  --  95*  --   --   --  84*  ALKPHOS 877*  --  760* 1066*  --  999*  --   --   --  1227*  BILITOT 43.4*  --  QUESTIONABLE RESULTS, RECOMMEND RECOLLECT TO VERIFY 41.9*  --  43.7*  --   --   --  45.2*  PROT 5.5*  --  4.1* 5.3*  --  5.3*  --   --   --  5.5*  ALBUMIN 1.8*  < > 1.4* 1.7* 2.0* 1.8* 1.8* 1.8*  --  1.8*  INR  --   --   --   --   --   --   --   --  1.58*  --   < > = values in this interval not displayed.   INFECTIOUS  Recent Labs Lab 08/04/15 0009  LATICACIDVEN 0.8     ENDOCRINE CBG (last 3)   Recent Labs  08/03/15 2353 08/04/15 0354 08/04/15 0804  GLUCAP 114* 104* 119*         IMAGING x48h  - image(s) personally visualized  -   highlighted in bold US Abdomen Complete  08/04/2015  CLINICAL DATA:  70 year old male with cirrhosis and elevated LFTs. EXAM: ABDOMEN ULTRASOUND COMPLETE  COMPARISON:  Ultrasound dated 07/22/2015 FINDINGS: Evaluation of the study is limited due to portable technique and vented patient who is not able to cooperate with the exam. Gallbladder: There is gallstone. There is no gallbladder wall thickening or pericholecystic fluid. Negative sonographic Murphy's sign. Common bile duct: Diameter: 5 mm Liver: There is irregular hepatic contour with coarsened echotexture compatible with morphologic changes of cirrhosis. IVC: No abnormality visualized. Pancreas: Not well visualized Spleen: Mildly enlarged measuring 15 cm in greatest length. A small splenule is noted. Right Kidney: Length: 10 cm. There is moderate renal parenchymal atrophy with slight increased echotexture. No hydronephrosis or echogenic stone. Multiple small hypoechoic lesions measuring up to 1 cm are not well evaluated but may represent cysts. Left Kidney: Length: 10 cm. There is mild diffuse cortical thinning with mild increased echotexture. No hydronephrosis or echogenic stone. Abdominal aorta: No definite aneurysm on limited evaluation. Other findings: There is small ascites. Small bilateral pleural effusions noted. IMPRESSION: Cirrhosis with evidence of portal hypertension, mild splenomegaly, and small ascites. Cholelithiasis without definite sonographic evidence acute cholecystitis. A hepatobiliary scintigraphy may provide better evaluation of the gallbladder if an acute cholecystitis is clinically suspected. No hydronephrosis or echogenic stone. Small bilateral pleural effusions. Electronically Signed   By: Anner Crete M.D.   On: 08/04/2015 04:04   Dg Chest Port 1 View  08/04/2015  CLINICAL DATA:  Chest tube. EXAM: PORTABLE CHEST 1 VIEW COMPARISON:  08/03/2015 FINDINGS: Tracheostomy in good position. Feeding tube enters the stomach with the tip not visualized. Right subclavian central venous catheter in the proximal SVC. Right chest tube in place. Small pneumothorax in the right lateral lung base  and right apex has progressed in the interval. Left subclavian central venous catheter tip in the SVC unchanged. Pulmonary vascular congestion unchanged. Bibasilar atelectasis unchanged. IMPRESSION: Progression of small right pneumothorax. Right chest tube remains in place Pulmonary vascular congestion unchanged Bibasilar atelectasis unchanged. Electronically Signed   By: Franchot Gallo M.D.   On: 08/04/2015 07:49   Dg Chest Port 1 View  08/03/2015  CLINICAL DATA:  Right side chest tube. EXAM: PORTABLE CHEST 1 VIEW COMPARISON:  08/03/2015 FINDINGS: Right chest tube remains in place, unchanged. No visible pneumothorax. Support devices are stable. Prior CABG. Cardiomegaly with vascular congestion and bibasilar atelectasis. IMPRESSION: No visible right pneumothorax currently. Cardiomegaly with vascular congestion and bibasilar atelectasis. Electronically Signed   By: Rolm Baptise M.D.   On: 08/03/2015 12:19   Dg Chest Port 1 View  08/03/2015  CLINICAL DATA:  Tracheostomy.  Respiratory distress. EXAM: PORTABLE CHEST 1 VIEW COMPARISON:  08/02/2015. FINDINGS: Heart is enlarged. Tracheostomy good position. Prior CABG. Interstitial edema may be slightly improved. LEFT chest tube has been removed.  There is no LEFT pneumothorax. Along the RIGHT lateral chest wall, RIGHT lung base, and RIGHT lung apex, there is an estimated 10% pneumothorax not visible on previous radiographs. Dual lumen catheter remains unchanged in position, proximal SVC from RIGHT subclavian approach. IMPRESSION: Interval development of a new RIGHT pneumothorax, 10% estimated. These results were called by telephone at the time of interpretation on 08/03/2015 at 7:50 am to Dr. Ivin Poot , who verbally acknowledged these results. Electronically Signed   By: Staci Righter M.D.   On: 08/03/2015 07:51       DISCUSSION: 70 y/o male s/p redo CABG 6/8 complicated by significant post operative bleeding in the setting of thrombocytopenia brought  back to the Surgical ICU on 6/9 with hypoxemic respiratory failure.  He has baseline  ESRD and DM2.  ASSESSMENT / PLAN:  PULMONARY A: Acute hypoxemic respiratory failure 2/2  Pulm edema, possible HCAP S/P Trache 6/20 by [facility] Post operative pleural bleeding   - on PRVC  40% fio2, peep 5. Curff leak + via trach. New Rt ptx - chest tube by CVTS  P:    Cont vent support. Daily PST as tolerated. Sedation being cut down. Consider making maintenance mode high PS given asynchrony w PRVC Intermittent CXR Chest tubes per thoracic surgery, replaced CT per surgery.  CARDIOVASCULAR A:  Severe Cardiogenic and hemorrhagic shock CAD s/p redo CABG IABP d/c'd on 6/18   on multiple pressors P:  Cont levophed and epi , vasio and milrinone drips, wean as able - goal sbp > 100 Tele monitoring. Amiodarone off  RENAL A:   ESRD CKD V   - on CRRT P:   Continue CVVHD per renal -- tolerating volume removal on pressors Renal following Replace electrolytes as indicated.  GASTROINTESTINAL A:   Jaundice and coagulopathy due to shocked liver. P:   OG tube TPN per pharmacy due to high residuals. H2 blocker for stress ulcer prophylaxis Abdominal U/S negative for obstruction, likely shocked liver.   HEMATOLOGIC  Recent Labs  08/03/15 0435 08/04/15 0400  HGB 8.1* 8.2*    A:   Diffuse oozing due to coagulopathy from blood loss, thrombocytopenia. Improved.  P:  Transfuse per TCTS guidelines. Trend CBC, coags.   INFECTIOUS A:   ?HCAP - doubt, respiratory culture negative Rare fungus in resp cx > suspect contaminant P:   Empiric abx primaxin (D12) and vanco (D10);  Diflucan day#0   ENDOCRINE CBG (last 3)   Recent Labs  08/03/15 2353 08/04/15 0354 08/04/15 0804  GLUCAP 114* 104* 119*     A:   DM2   P:   Monitor glucose Insulin gtt per TCTS post op protocol  NEUROLOGIC A:   Sedation needed  for vent synchrony P:   RASS goal: -2 to -3 Work to decrease sedation as  able.   FAMILY  - Updates:  No family bedside 6/23 , 08/02/15, 08/03/15, 6/28 - Inter-disciplinary family meet or Palliative Care meeting due by:    GLOBAL Doubt he can survive past next few to several days   [place] [facility] Pager [phone] till 3 pm If no answer page [phone] 08/04/2015, 10:58 AM

## 2015-08-04 NOTE — Progress Notes (Signed)
12 Days Post-Op Procedure(s) (LRB): STERNAL CLOSURE WITH PUMP STANDBY (N/A) TRANSESOPHAGEAL ECHOCARDIOGRAM (TEE) (N/A) Subjective: Appreciate gastroenterology consultation for patient's acute on chronic liver failure Bilirubin remains at 36. Repeat abdominal ultrasound shows no bile duct obstruction, evidence of portal hypertension without portal thrombosis. Ammonia level remains at 28 and patient remains somewhat responsive.  Continuing broad-spectrum antibiotics for sepsis-probable pneumonia with IV Primaxin, vancomycin, Diflucan. Oral Flagyl for positive C. difficile antigen Platelet count remains over 120,000  Weaning vasopressin slowly as patient's signs of sepsis improve  Because of persistent acute hepatic failure on underlying cirrhosis the prognosis is poor. We'll discuss limited DNR with family.  Objective: Vital signs in last 24 hours: Temp:  [96.5 F (35.8 C)-98.9 F (37.2 C)] 97.7 F (36.5 C) (06/28 0700) Pulse Rate:  [25-91] 87 (06/28 0900) Cardiac Rhythm:  [-] Normal sinus rhythm (06/28 0800) Resp:  [16-36] 26 (06/28 0900) BP: (155)/(41) 155/41 mmHg (06/27 1214) SpO2:  [100 %] 100 % (06/28 0900) Arterial Line BP: (68-186)/(23-58) 110/37 mmHg (06/28 0900) FiO2 (%):  [40 %-100 %] 40 % (06/28 0847) Weight:  [217 lb 6 oz (98.6 kg)] 217 lb 6 oz (98.6 kg) (06/28 0416)  Hemodynamic parameters for last 24 hours: CVP:  [8 mmHg-11 mmHg] 10 mmHg  Intake/Output from previous day: 06/27 0701 - 06/28 0700 In: 4094.8 [I.V.:2618.2; NG/GT:926.7; IV Piggyback:550] Out: [id number] [Stool:200; Chest Tube:350] Intake/Output this shift: Total I/O In: 294.2 [I.V.:184.2; NG/GT:110] Out: 278 [Other:278]  Opens eyes to voice command and appears engaged Deep jaundice unchanged Coarse breath sounds Sternal incision, right axillary incision healing clean and dry Minimal peripheral edema  Lab Results:  Recent Labs  08/03/15 0435 08/04/15 0400  WBC 14.8* 17.8*  HGB 8.1* 8.2*  HCT  26.2* 27.0*  PLT 129* 127*   BMET:  Recent Labs  08/03/15 1630 08/04/15 0400  NA 133* 132*  K 4.6 4.0  CL 101 100*  CO2 23 23  GLUCOSE 147* 105*  BUN 46* 43*  CREATININE 1.85* 1.66*  CALCIUM 8.5* 8.4*    PT/INR:  Recent Labs  08/03/15 1815  LABPROT 18.9*  INR 1.58*   ABG    Component Value Date/Time   PHART 7.413 08/04/2015 0355   HCO3 24.7* 08/04/2015 0355   TCO2 25.9 08/04/2015 0355   ACIDBASEDEF 1.0 08/02/2015 2005   O2SAT 67.5 08/04/2015 0410   CBG (last 3)   Recent Labs  08/03/15 2353 08/04/15 0354 08/04/15 0804  GLUCAP 114* 104* 119*    Assessment/Plan: S/P Procedure(s) (LRB): STERNAL CLOSURE WITH PUMP STANDBY (N/A) TRANSESOPHAGEAL ECHOCARDIOGRAM (TEE) (N/A) Continue tube feeds, antibiotics, and sedation protocol We'll discuss limited DNR with patient's family because of poor prognosis with persistent severe jaundice and underlying cirrhosis   LOS: 29 days    Tharon Aquas Trigt III 08/04/2015

## 2015-08-04 NOTE — Progress Notes (Addendum)
[facility] Progress Note    Assessment/ Plan:   70 y.o. male with ESRD who was admitted 07/04/2015 with CP and subsequent redo CABG complicated by blood loss req transfusion and hemodynamic instability -s/p balloon pump, VDRF, shock, CRRT  1. CAD- complicated redo CABG- continues to be on pressors; high Levophed doses. 2. ESRD -TTS [facility] -on CRRT now since 6/17. Has left forearm AVF -Increased fluids pre filter fluid to prevent clotting. S/p 500cc bolus on 6/25. Continue to keep even with CRRT. - I doubt he would tolerate net UF especially with the 3rd spacing and pressor requirements - bladder scan 6/27  --> 20m which is not unexpected. 3. Anemia- has required transfusions- fairly stable h/h today. 4. HTN/volume- CRRT with volume removal (keep net even); fortunately able to at least keep even over past 24hrs. 5. Inc LFTs/hyperbilirubinemia  6. Alkalosis, improved off citrate, problematic usage with liver disease  Subjective:   Continues to be on the vent with worsening pressor requirements. Levophed increased to 31 mcg Also on Epinephrine and Vasopressin CVP mildly elevated.  25% right PTX 6/27 --> CTwith rush of air return   Objective:   BP 155/41 mmHg  Pulse 87  Temp(Src) 97.7 F (36.5 C) (Axillary)  Resp 26  Ht '5\' 11"'$  (1.803 m)  Wt 98.6 kg (217 lb 6 oz)  BMI 30.33 kg/m2  SpO2 100%  Intake/Output Summary (Last 24 hours) at 08/04/15 0920 Last data filed at 08/04/15 0900  Gross per 24 hour  Intake 4197.74 ml  Output   4175 ml  Net  22.74 ml   Weight change: 1.6 kg (3 lb 8.4 oz)  Physical Exam: General appearance: On vent; eyes open but no purposeful movements Extremities: trace in the lower extremities. Jaundiced  RRR No BS but soft abdomen Rales posteriorly; decreased BS right posterior  Imaging: [place]domen Complete  08/04/2015  CLINICAL DATA:  70year old male with cirrhosis and elevated LFTs. EXAM: ABDOMEN ULTRASOUND  COMPLETE COMPARISON:  Ultrasound dated 07/22/2015 FINDINGS: Evaluation of the study is limited due to portable technique and vented patient who is not able to cooperate with the exam. Gallbladder: There is gallstone. There is no gallbladder wall thickening or pericholecystic fluid. Negative sonographic Murphy's sign. Common bile duct: Diameter: 5 mm Liver: There is irregular hepatic contour with coarsened echotexture compatible with morphologic changes of cirrhosis. IVC: No abnormality visualized. Pancreas: Not well visualized Spleen: Mildly enlarged measuring 15 cm in greatest length. A small splenule is noted. Right Kidney: Length: 10 cm. There is moderate renal parenchymal atrophy with slight increased echotexture. No hydronephrosis or echogenic stone. Multiple small hypoechoic lesions measuring up to 1 cm are not well evaluated but may represent cysts. Left Kidney: Length: 10 cm. There is mild diffuse cortical thinning with mild increased echotexture. No hydronephrosis or echogenic stone. Abdominal aorta: No definite aneurysm on limited evaluation. Other findings: There is small ascites. Small bilateral pleural effusions noted. IMPRESSION: Cirrhosis with evidence of portal hypertension, mild splenomegaly, and small ascites. Cholelithiasis without definite sonographic evidence acute cholecystitis. A hepatobiliary scintigraphy may provide better evaluation of the gallbladder if an acute cholecystitis is clinically suspected. No hydronephrosis or echogenic stone. Small bilateral pleural effusions. Electronically Signed   By: AAnner CreteM.D.   On: 08/04/2015 04:04   Dg Chest Port 1 View  08/04/2015  CLINICAL DATA:  Chest tube. EXAM: PORTABLE CHEST 1 VIEW COMPARISON:  08/03/2015 FINDINGS: Tracheostomy in good position. Feeding tube enters the stomach with the tip not visualized. Right  subclavian central venous catheter in the proximal SVC. Right chest tube in place. Small pneumothorax in the right lateral  lung base and right apex has progressed in the interval. Left subclavian central venous catheter tip in the SVC unchanged. Pulmonary vascular congestion unchanged. Bibasilar atelectasis unchanged. IMPRESSION: Progression of small right pneumothorax. Right chest tube remains in place Pulmonary vascular congestion unchanged Bibasilar atelectasis unchanged. Electronically Signed   By: Franchot Gallo M.D.   On: 08/04/2015 07:49   Dg Chest Port 1 View  08/03/2015  CLINICAL DATA:  Right side chest tube. EXAM: PORTABLE CHEST 1 VIEW COMPARISON:  08/03/2015 FINDINGS: Right chest tube remains in place, unchanged. No visible pneumothorax. Support devices are stable. Prior CABG. Cardiomegaly with vascular congestion and bibasilar atelectasis. IMPRESSION: No visible right pneumothorax currently. Cardiomegaly with vascular congestion and bibasilar atelectasis. Electronically Signed   By: Rolm Baptise M.D.   On: 08/03/2015 12:19   Dg Chest Port 1 View  08/03/2015  CLINICAL DATA:  Tracheostomy.  Respiratory distress. EXAM: PORTABLE CHEST 1 VIEW COMPARISON:  08/02/2015. FINDINGS: Heart is enlarged. Tracheostomy good position. Prior CABG. Interstitial edema may be slightly improved. LEFT chest tube has been removed.  There is no LEFT pneumothorax. Along the RIGHT lateral chest wall, RIGHT lung base, and RIGHT lung apex, there is an estimated 10% pneumothorax not visible on previous radiographs. Dual lumen catheter remains unchanged in position, proximal SVC from RIGHT subclavian approach. IMPRESSION: Interval development of a new RIGHT pneumothorax, 10% estimated. These results were called by telephone at the time of interpretation on 08/03/2015 at 7:50 am to Dr. Ivin Poot , who verbally acknowledged these results. Electronically Signed   By: Staci Righter M.D.   On: 08/03/2015 07:51    Labs: BMET  Recent Labs Lab 08/01/15 1553 08/02/15 0407 08/02/15 0520 08/02/15 1408 08/03/15 0435 08/03/15 0436  08/03/15 1630 08/04/15 0400  NA 134* 138 135 134* 132* 133* 133* 132*  K 4.3 3.3* 4.3 4.4 4.1 4.1 4.6 4.0  CL 102 113* 101 103 102 103 101 100*  CO2 24 20* 25 20* '23 22 23 23  '$ GLUCOSE 140* 127* 148* 154* 158* 155* 147* 105*  BUN 48* 41* 48* 51* 48* 49* 46* 43*  CREATININE 1.81* 1.61* 1.93* 1.92* 1.85* 1.82* 1.85* 1.66*  CALCIUM 8.0* 6.4* 8.2* 8.6* 8.3* 8.2* 8.5* 8.4*  PHOS 4.4 3.8 4.8* 4.9*  --  4.5 5.2* 4.4   CBC  Recent Labs Lab 08/02/15 0407 08/02/15 0520 08/03/15 0435 08/04/15 0400  WBC 12.9* 16.3* 14.8* 17.8*  HGB 7.1* 8.6* 8.1* 8.2*  HCT 22.9* 27.9* 26.2* 27.0*  MCV 93.5 93.6 93.9 94.4  PLT 127* 130* 129* 127*    Medications:    . sodium chloride   Intravenous Once  . antiseptic oral rinse  7 mL Mouth Rinse 10 times per day  . chlorhexidine gluconate (SAGE KIT)  15 mL Mouth Rinse BID  . darbepoetin (ARANESP) injection - NON-DIALYSIS  100 mcg Subcutaneous Q Fri-1800  . feeding supplement (NEPRO CARB STEADY)  1,000 mL Per Tube Q24H  . feeding supplement (PRO-STAT SUGAR FREE 64)  60 mL Per Tube QID  . imipenem-cilastatin  500 mg Intravenous Q8H  . insulin aspart  0-9 Units Subcutaneous Q4H  . insulin glargine  25 Units Subcutaneous BID  . levalbuterol  1.25 mg Nebulization Q6H  . metroNIDAZOLE  500 mg Oral TID  . pantoprazole (PROTONIX) IV  40 mg Intravenous Q24H  . QUEtiapine  50 mg Oral BID  .  sodium chloride flush  10-40 mL Intracatheter Q12H  . vancomycin  1,000 mg Intravenous Q24H   Seen on CRRT '@845am'$  Qb16m/min Pre/post 35/300 Qd 150108mmin Net UF even No changes; will not tolerate increase in UF.   JaOtelia SanteeMD 08/04/2015, 9:20 AM

## 2015-08-04 NOTE — Progress Notes (Addendum)
ID Note  70yo M with multiorgan failure, decompensated liver disease with coagulopathy. Recently he is found to have cdifficile infection ( cdiff PCR + in setting of diarrhea, leukocytosis ) - will recommend to change po metronidazole to high dose oral vancomycin 500mg  QID  Ideally, to help with treatment of cdifficile, other antibiotics should be stopped if possible. Consider discontinuation of imipenem if no other sources of infection found (such as peritonitis)  Call if you would like to have formal id consultation  Lasya Vetter B. [place] [facility] [phone]

## 2015-08-04 NOTE — Progress Notes (Signed)
Progress Note   Subjective  [name] is a 70 y/o [place] male with significant cardiac history CAD s/p CABG 2001 and STEMI, CHF, EF 35% s/p CABG 04/12/60 with complicated post operative course. We were consulted on 08/03/15 regarding Elevated LFT's and hyperbilirubinemia.  Pt is still sedated and on vent. No change per nursing overnight.   Objective   Vital signs in last 24 hours: Temp:  [96.5 F (35.8 C)-98.9 F (37.2 C)] 97.7 F (36.5 C) (06/28 0700) Pulse Rate:  [25-91] 87 (06/28 0900) Resp:  [16-36] 26 (06/28 0900) BP: (155)/(41) 155/41 mmHg (06/27 1214) SpO2:  [100 %] 100 % (06/28 0900) Arterial Line BP: (68-186)/(23-58) 110/37 mmHg (06/28 0900) FiO2 (%):  [40 %-100 %] 40 % (06/28 0847) Weight:  [217 lb 6 oz (98.6 kg)] 217 lb 6 oz (98.6 kg) (06/28 0416) Last BM Date: 08/03/15 General:  Critically ill caucasian male on vent and CVVHD Heart:  Regular rate and rhythm; no murmurs, Median sternotomy Lungs: on vent Abdomen: Distended, multiple bruises present, decreased bs all four quadrants Extremities:  Without edema. Neurologic: Sedated Psych: Sedated  Intake/Output from previous day: 06/27 0701 - 06/28 0700 In: 4094.8 [I.V.:2618.2; NG/GT:926.7; IV Piggyback:550] Out: 4218 [Stool:200; Chest Tube:350] Intake/Output this shift: Total I/O In: 294.2 [I.V.:184.2; NG/GT:110] Out: 278 [Other:278]  Lab Results:  Recent Labs  08/02/15 0520 08/03/15 0435 08/04/15 0400  WBC 16.3* 14.8* 17.8*  HGB 8.6* 8.1* 8.2*  HCT 27.9* 26.2* 27.0*  PLT 130* 129* 127*   BMET  Recent Labs  08/03/15 0436 08/03/15 1630 08/04/15 0400  NA 133* 133* 132*  K 4.1 4.6 4.0  CL 103 101 100*  CO2 '22 23 23  '$ GLUCOSE 155* 147* 105*  BUN 49* 46* 43*  CREATININE 1.82* 1.85* 1.66*  CALCIUM 8.2* 8.5* 8.4*   LFT  Recent Labs  08/04/15 0400  PROT 5.5*  ALBUMIN 1.8*  AST 249*  ALT 84*  ALKPHOS 1227*  BILITOT 45.2*  BILIDIR 28.9*   PT/INR  Recent Labs  08/03/15 1815    LABPROT 18.9*  INR 1.58*    Studies/Results: US Abdomen Complete  08/04/2015  CLINICAL DATA:  70 year old male with cirrhosis and elevated LFTs. EXAM: ABDOMEN ULTRASOUND COMPLETE COMPARISON:  Ultrasound dated 07/22/2015 FINDINGS: Evaluation of the study is limited due to portable technique and vented patient who is not able to cooperate with the exam. Gallbladder: There is gallstone. There is no gallbladder wall thickening or pericholecystic fluid. Negative sonographic Murphy's sign. Common bile duct: Diameter: 5 mm Liver: There is irregular hepatic contour with coarsened echotexture compatible with morphologic changes of cirrhosis. IVC: No abnormality visualized. Pancreas: Not well visualized Spleen: Mildly enlarged measuring 15 cm in greatest length. A small splenule is noted. Right Kidney: Length: 10 cm. There is moderate renal parenchymal atrophy with slight increased echotexture. No hydronephrosis or echogenic stone. Multiple small hypoechoic lesions measuring up to 1 cm are not well evaluated but may represent cysts. Left Kidney: Length: 10 cm. There is mild diffuse cortical thinning with mild increased echotexture. No hydronephrosis or echogenic stone. Abdominal aorta: No definite aneurysm on limited evaluation. Other findings: There is small ascites. Small bilateral pleural effusions noted. IMPRESSION: Cirrhosis with evidence of portal hypertension, mild splenomegaly, and small ascites. Cholelithiasis without definite sonographic evidence acute cholecystitis. A hepatobiliary scintigraphy may provide better evaluation of the gallbladder if an acute cholecystitis is clinically suspected. No hydronephrosis or echogenic stone. Small bilateral pleural effusions. Electronically Signed   By: Laren Everts.D.  On: 08/04/2015 04:04   Dg Chest Port 1 View  08/04/2015  CLINICAL DATA:  Chest tube. EXAM: PORTABLE CHEST 1 VIEW COMPARISON:  08/03/2015 FINDINGS: Tracheostomy in good position. Feeding tube  enters the stomach with the tip not visualized. Right subclavian central venous catheter in the proximal SVC. Right chest tube in place. Small pneumothorax in the right lateral lung base and right apex has progressed in the interval. Left subclavian central venous catheter tip in the SVC unchanged. Pulmonary vascular congestion unchanged. Bibasilar atelectasis unchanged. IMPRESSION: Progression of small right pneumothorax. Right chest tube remains in place Pulmonary vascular congestion unchanged Bibasilar atelectasis unchanged. Electronically Signed   By: Marlan Palau M.D.   On: 08/04/2015 07:49   Dg Chest Port 1 View  08/03/2015  CLINICAL DATA:  Right side chest tube. EXAM: PORTABLE CHEST 1 VIEW COMPARISON:  08/03/2015 FINDINGS: Right chest tube remains in place, unchanged. No visible pneumothorax. Support devices are stable. Prior CABG. Cardiomegaly with vascular congestion and bibasilar atelectasis. IMPRESSION: No visible right pneumothorax currently. Cardiomegaly with vascular congestion and bibasilar atelectasis. Electronically Signed   By: Charlett Nose M.D.   On: 08/03/2015 12:19   Dg Chest Port 1 View  08/03/2015  CLINICAL DATA:  Tracheostomy.  Respiratory distress. EXAM: PORTABLE CHEST 1 VIEW COMPARISON:  08/02/2015. FINDINGS: Heart is enlarged. Tracheostomy good position. Prior CABG. Interstitial edema may be slightly improved. LEFT chest tube has been removed.  There is no LEFT pneumothorax. Along the RIGHT lateral chest wall, RIGHT lung base, and RIGHT lung apex, there is an estimated 10% pneumothorax not visible on previous radiographs. Dual lumen catheter remains unchanged in position, proximal SVC from RIGHT subclavian approach. IMPRESSION: Interval development of a new RIGHT pneumothorax, 10% estimated. These results were called by telephone at the time of interpretation on 08/03/2015 at 7:50 am to Dr. Kerin Perna , who verbally acknowledged these results. Electronically Signed   By: Elsie Stain M.D.   On: 08/03/2015 07:51       Assessment / Plan:   Impression: 1. Elevated LFT's and hyperbilirubinemia- Steady increase since day of CABG on 07/09/2015, some decrease overnight: AST/ALT 262/95 down to 249/84, Alk phos incr 999 to 1227, bili inc 43.7 to 45.2;direct bili elevated at 28.9; ANA, ASMA, AMA, EBV and CMV pending, ferritin ordered today 2. CAD-complicated redo CABG-continues on pressors; high Levophed doses 3. Anemia- due to massive post-op bleeding, has required multiple transfusions (26 prbcs, 6 cryo, 14 ffp and 15 plt) 4. HTN/volume-CRRT with volume removal 5. Alkalosis 6. ESRD on CVVH 7. H/o thrombocytopenia 8. H/o previous liver dz- 06/2013 liver bx showing fibrosis but no cirrhosis; per notes from [place], h/o increased iron stores, but amyloid stains on the liver and on rectal bx were negative; tested neg for hep C and HIV. ANA neg. Hep B surface antigen neg.  Plan: 1. Ordered Ferritin 2. Continue supportive measures 3. Further liver labs pending as above 4. Will discuss any further recommendations with Dr. Lavon Paganini, please await her recommendations  Thank you for your kind consultation, we will continue to follow.  Principal Problem:   NSTEMI (non-ST elevated myocardial infarction) [facility]) Active Problems:   Type 2 diabetes mellitus with renal manifestations (HCC)   Thrombocytopenia (HCC)   Ischemic cardiomyopathy   CAD s/p CABG 2001, prior POBA, PCI/DES July 2016   Chronic combined systolic and diastolic heart failure (HCC)   Hypertensive heart disease with heart failure (HCC)   ESRD (end stage renal disease) (HCC)   Dyslipidemia,  goal LDL below 70   Qualitative platelet defects (HCC)   Hepatosplenomegaly   Anemia of chronic disease   CAD (coronary artery disease)   Acute respiratory failure with hypoxia (HCC)   Acute pulmonary edema (HCC)   Acute respiratory failure with hypoxemia (HCC)   Cardiogenic shock (HCC)   HCAP (healthcare-associated  pneumonia)   Pressure ulcer   AKI (acute kidney injury) ([place])   Acute on chronic respiratory failure ([place])   Cirrhosis of liver without ascites ([place])     LOS: 29 days   Levin Erp  08/04/2015, 9:52 AM  Pager # [phone]

## 2015-08-04 NOTE — Op Note (Signed)
[name], [name] NO.:  [id number]  MEDICAL RECORD NO.:  [id number]  LOCATION:  2S07C                        FACILITY:  [facility]  PHYSICIAN:  Ivin Poot, M.D.  DATE OF BIRTH:  [birth year]  DATE OF PROCEDURE:  08/03/2015 DATE OF DISCHARGE:                              OPERATIVE REPORT   OPERATION:  Placement of right chest tube.  PREOPERATIVE DIAGNOSIS:  Spontaneous right pneumothorax.  POSTOPERATIVE DIAGNOSIS:  Spontaneous right pneumothorax.  ANESTHESIA:  Local with 1% lidocaine.  SURGEON:  Ivin Poot, M.D.  CLINICAL NOTE:  The patient is a 70 year old male on dialysis with multiple medical problems, who underwent urgent redo CABG 3 weeks ago. He has been ventilator dependent and today on routine morning x-ray, a 25% right pneumothorax was identified.  An informed consent for right chest tube was obtained from the patient's daughter, April.  DESCRIPTION OF PROCEDURE:  The patient was given intravenous pain medication per his current orders.  The right chest was prepped and draped as a sterile field.  A proper time-out was performed.  Lidocaine 1% was infiltrated beneath the right breast crease.  A 1-inch incision was made.  Further lidocaine was infiltrated into the intercostal muscle.  A 28-French trocar chest tube was then placed through the fifth interspace in the anterior axillary line and directed to the apex.  An immediate outrush of air followed.  The tube drained some serosanguineous fluid with fluctuations indicating it was in a pleural position.  It was connected to the Pleur-evac chamber.  It was secured to the skin with suture.  Sterile dressing was applied.  Chest x-ray is pending.     Ivin Poot, M.D.     PV/MEDQ  D:  08/03/2015  T:  08/04/2015  Job:  [id number]

## 2015-08-05 ENCOUNTER — Inpatient Hospital Stay (HOSPITAL_COMMUNITY): Payer: Medicare Other

## 2015-08-05 ENCOUNTER — Ambulatory Visit: Payer: Medicare Other | Admitting: Internal Medicine

## 2015-08-05 LAB — COMPREHENSIVE METABOLIC PANEL
ALT: 81 U/L — ABNORMAL HIGH (ref 17–63)
AST: 238 U/L — ABNORMAL HIGH (ref 15–41)
Albumin: 1.8 g/dL — ABNORMAL LOW (ref 3.5–5.0)
Alkaline Phosphatase: 1259 U/L — ABNORMAL HIGH (ref 38–126)
Anion gap: 9 (ref 5–15)
BUN: 44 mg/dL — ABNORMAL HIGH (ref 6–20)
CO2: 24 mmol/L (ref 22–32)
Calcium: 8.3 mg/dL — ABNORMAL LOW (ref 8.9–10.3)
Chloride: 100 mmol/L — ABNORMAL LOW (ref 101–111)
Creatinine, Ser: 1.63 mg/dL — ABNORMAL HIGH (ref 0.61–1.24)
GFR calc Af Amer: 49 mL/min — ABNORMAL LOW (ref >60)
GFR calc non Af Amer: 42 mL/min — ABNORMAL LOW (ref >60)
Glucose, Bld: 121 mg/dL — ABNORMAL HIGH (ref 65–99)
Potassium: 4 mmol/L (ref 3.5–5.1)
Sodium: 133 mmol/L — ABNORMAL LOW (ref 135–145)
Total Bilirubin: 44.8 mg/dL (ref 0.3–1.2)
Total Protein: 5.1 g/dL — ABNORMAL LOW (ref 6.5–8.1)

## 2015-08-05 LAB — RENAL FUNCTION PANEL
ALBUMIN: 1.8 g/dL — AB (ref 3.5–5.0)
ANION GAP: 9 (ref 5–15)
Albumin: 2 g/dL — ABNORMAL LOW (ref 3.5–5.0)
Anion gap: 8 (ref 5–15)
BUN: 44 mg/dL — ABNORMAL HIGH (ref 6–20)
BUN: 42 mg/dL — AB (ref 6–20)
CALCIUM: 8.3 mg/dL — AB (ref 8.9–10.3)
CHLORIDE: 101 mmol/L (ref 101–111)
CO2: 24 mmol/L (ref 22–32)
CO2: 23 mmol/L (ref 22–32)
CREATININE: 1.66 mg/dL — AB (ref 0.61–1.24)
CREATININE: 1.78 mg/dL — AB (ref 0.61–1.24)
Calcium: 8.5 mg/dL — ABNORMAL LOW (ref 8.9–10.3)
Chloride: 100 mmol/L — ABNORMAL LOW (ref 101–111)
GFR calc Af Amer: 44 mL/min — ABNORMAL LOW (ref >60)
GFR calc non Af Amer: 41 mL/min — ABNORMAL LOW (ref >60)
GFR calc non Af Amer: 38 mL/min — ABNORMAL LOW (ref >60)
GFR, EST AFRICAN AMERICAN: 48 mL/min — AB (ref >60)
GLUCOSE: 121 mg/dL — AB (ref 65–99)
GLUCOSE: 148 mg/dL — AB (ref 65–99)
PHOSPHORUS: 4.7 mg/dL — AB (ref 2.5–4.6)
POTASSIUM: 4 mmol/L (ref 3.5–5.1)
Phosphorus: 4.8 mg/dL — ABNORMAL HIGH (ref 2.5–4.6)
Potassium: 4 mmol/L (ref 3.5–5.1)
SODIUM: 133 mmol/L — AB (ref 135–145)
Sodium: 132 mmol/L — ABNORMAL LOW (ref 135–145)

## 2015-08-05 LAB — POCT I-STAT 3, ART BLOOD GAS (G3+)
BICARBONATE: 25.4 meq/L — AB (ref 20.0–24.0)
O2 Saturation: 96 %
TCO2: 27 mmol/L (ref 0–100)
pCO2 arterial: 43.2 mmHg (ref 35.0–45.0)
pH, Arterial: 7.377 (ref 7.350–7.450)
pO2, Arterial: 88 mmHg (ref 80.0–100.0)

## 2015-08-05 LAB — TYPE AND SCREEN
ABO/RH(D): A POS
Antibody Screen: NEGATIVE
Unit division: 0

## 2015-08-05 LAB — GLUCOSE, CAPILLARY
GLUCOSE-CAPILLARY: 112 mg/dL — AB (ref 65–99)
GLUCOSE-CAPILLARY: 159 mg/dL — AB (ref 65–99)
GLUCOSE-CAPILLARY: 160 mg/dL — AB (ref 65–99)
GLUCOSE-CAPILLARY: 171 mg/dL — AB (ref 65–99)
Glucose-Capillary: 118 mg/dL — ABNORMAL HIGH (ref 65–99)
Glucose-Capillary: 123 mg/dL — ABNORMAL HIGH (ref 65–99)

## 2015-08-05 LAB — CBC
HCT: 28.6 % — ABNORMAL LOW (ref 39.0–52.0)
Hemoglobin: 8.8 g/dL — ABNORMAL LOW (ref 13.0–17.0)
MCH: 29.3 pg (ref 26.0–34.0)
MCHC: 30.8 g/dL (ref 30.0–36.0)
MCV: 95.3 fL (ref 78.0–100.0)
Platelets: 114 10*3/uL — ABNORMAL LOW (ref 150–400)
RBC: 3 MIL/uL — ABNORMAL LOW (ref 4.22–5.81)
RDW: 24.3 % — ABNORMAL HIGH (ref 11.5–15.5)
WBC: 16.1 10*3/uL — ABNORMAL HIGH (ref 4.0–10.5)

## 2015-08-05 LAB — CULTURE, RESPIRATORY W GRAM STAIN: Special Requests: NORMAL

## 2015-08-05 LAB — CARBOXYHEMOGLOBIN
Carboxyhemoglobin: 2.1 % — ABNORMAL HIGH (ref 0.5–1.5)
Methemoglobin: 0.6 % (ref 0.0–1.5)
O2 Saturation: 65.9 %
Total hemoglobin: 9 g/dL — ABNORMAL LOW (ref 13.5–18.0)

## 2015-08-05 LAB — PHOSPHORUS: PHOSPHORUS: 4.8 mg/dL — AB (ref 2.5–4.6)

## 2015-08-05 LAB — EPSTEIN-BARR VIRUS VCA, IGG: EBV VCA IgG: >600 U/mL — ABNORMAL HIGH (ref 0.0–17.9)

## 2015-08-05 LAB — EPSTEIN-BARR VIRUS VCA, IGM: EBV VCA IgM: <36 U/mL (ref 0.0–35.9)

## 2015-08-05 LAB — CMV IGM

## 2015-08-05 LAB — MITOCHONDRIAL ANTIBODIES: MITOCHONDRIAL M2 AB, IGG: 4.2 U (ref 0.0–20.0)

## 2015-08-05 LAB — MAGNESIUM: Magnesium: 2.5 mg/dL — ABNORMAL HIGH (ref 1.7–2.4)

## 2015-08-05 LAB — CALCIUM, IONIZED: Calcium, Ionized, Serum: 4.8 mg/dL (ref 4.5–5.6)

## 2015-08-05 LAB — ANTINUCLEAR ANTIBODIES, IFA: ANA Ab, IFA: NEGATIVE

## 2015-08-05 LAB — ANTI-SMOOTH MUSCLE ANTIBODY, IGG: F-Actin IgG: 11 Units (ref 0–19)

## 2015-08-05 MED ORDER — ALBUMIN HUMAN 5 % IV SOLN: 12.5000 g | Freq: Once | INTRAVENOUS | Status: AC

## 2015-08-05 MED ORDER — RIFAXIMIN 550 MG PO TABS
550.0000 mg | ORAL_TABLET | Freq: Three times a day (TID) | ORAL | Status: DC
  Administered 2015-08-05 – 2015-08-07 (×9): 550 mg via ORAL
  Filled 2015-08-05 (×9): qty 1

## 2015-08-05 MED ORDER — CALCIUM CHLORIDE 10 % IV SOLN
1.0000 g | Freq: Once | INTRAVENOUS | Status: AC
  Administered 2015-08-05: 1 g via INTRAVENOUS

## 2015-08-05 MED ORDER — ALBUMIN HUMAN 5 % IV SOLN
12.5000 g | Freq: Once | INTRAVENOUS | Status: AC
  Administered 2015-08-05: 12.5 g via INTRAVENOUS

## 2015-08-05 MED ORDER — ALBUMIN HUMAN 5 % IV SOLN
INTRAVENOUS | Status: AC
  Administered 2015-08-05: 12.5 g via INTRAVENOUS
  Filled 2015-08-05: qty 250

## 2015-08-05 MED ORDER — ALBUMIN HUMAN 5 % IV SOLN
INTRAVENOUS | Status: AC
  Administered 2015-08-05: 12.5 g
  Filled 2015-08-05: qty 250

## 2015-08-05 NOTE — Progress Notes (Signed)
Pt placed back on previous settings of PRVC 40% FiO2, Rate 16, PEEP 5, Tidal Volume 500. Will continue to monitor pt closely.

## 2015-08-05 NOTE — Progress Notes (Addendum)
[name] Progress Note    Assessment/ Plan:   70 y.o. [name] with ESRD who was admitted 06/17/2015 with CP and subsequent redo CABG complicated by blood loss req transfusion and hemodynamic instability -s/p balloon pump, VDRF, shock, CRRT  1. CAD- complicated redo CABG- continues to be on pressors; high Levophed doses. 2. ESRD -TTS [facility]-on CRRT now since 6/17. Has left forearm AVF -Increased fluids pre filter fluid to prevent clotting. S/p 500cc bolus on 6/25. Continue to keep even with CRRT. - I doubt he would tolerate net UF especially with the 3rd spacing and pressor requirements  --> continues to be on high Levophed doses (currently at 73mg); therefore, continue to keep even. - bladder scan 6/27 --> 473mwhich is not unexpected. 3. Anemia- has required transfusions- fairly stable h/h today. 4. HTN/volume- CRRT with volume removal (keep net even); fortunately able to at least keep even over past 24hrs. 5. Inc LFTs/hyperbilirubinemia  6. Alkalosis, improved off citrate, problematic usage with liver disease  Subjective:   Continues to be on the vent with worsening pressor requirements. Levophed increased to 31 mcg Also on Epinephrine and Vasopressin CVP mildly elevated.  25% right PTX 6/27 --> CTwith rush of air return Restless overnight requiring additional Fentanyl.   Objective:   BP 131/52 mmHg  Pulse 88  Temp(Src) 98.8 F (37.1 C) (Oral)  Resp 19  Ht '5\' 11"'$  (1.803 m)  Wt 97.8 kg (215 lb 9.8 oz)  BMI 30.08 kg/m2  SpO2 100%  Intake/Output Summary (Last 24 hours) at 08/05/15 0734 Last data filed at 08/05/15 0700  Gross per 24 hour  Intake 4584.09 ml  Output   4321 ml  Net 263.09 ml   Weight change: -0.8 kg (-1 lb 12.2 oz)  Physical Exam: General appearance: On vent; eyes open but no purposeful movements Extremities: trace in the lower extremities. Jaundiced  RRR No BS but soft abdomen Rales posteriorly; decreased BS right  posterior. Rt Chest tube  Imaging: UsKoreabdomen Complete  08/04/2015  CLINICAL DATA:  Joseph [name] with cirrhosis and elevated LFTs. EXAM: ABDOMEN ULTRASOUND COMPLETE COMPARISON:  Ultrasound dated 07/22/2015 FINDINGS: Evaluation of the study is limited due to portable technique and vented patient who is not able to cooperate with the exam. Gallbladder: There is gallstone. There is no gallbladder wall thickening or pericholecystic fluid. Negative sonographic Murphy's sign. Common bile duct: Diameter: 5 mm Liver: There is irregular hepatic contour with coarsened echotexture compatible with morphologic changes of cirrhosis. IVC: No abnormality visualized. Pancreas: Not well visualized Spleen: Mildly enlarged measuring 15 cm in greatest length. A small splenule is noted. Right Kidney: Length: 10 cm. There is moderate renal parenchymal atrophy with slight increased echotexture. No hydronephrosis or echogenic stone. Multiple small hypoechoic lesions measuring up to 1 cm are not well evaluated but may represent cysts. Left Kidney: Length: 10 cm. There is mild diffuse cortical thinning with mild increased echotexture. No hydronephrosis or echogenic stone. Abdominal aorta: No definite aneurysm on limited evaluation. Other findings: There is small ascites. Small bilateral pleural effusions noted. IMPRESSION: Cirrhosis with evidence of portal hypertension, mild splenomegaly, and small ascites. Cholelithiasis without definite sonographic evidence acute cholecystitis. A hepatobiliary scintigraphy may provide better evaluation of the gallbladder if an acute cholecystitis is clinically suspected. No hydronephrosis or echogenic stone. Small bilateral pleural effusions. Electronically Signed   By: ArAnner Crete.D.   On: 08/04/2015 04:04   Dg Chest Port 1 View  08/04/2015  CLINICAL DATA:  Chest tube. EXAM:  PORTABLE CHEST 1 VIEW COMPARISON:  08/03/2015 FINDINGS: Tracheostomy in good position. Feeding tube enters the  stomach with the tip not visualized. Right subclavian central venous catheter in the proximal SVC. Right chest tube in place. Small pneumothorax in the right lateral lung base and right apex has progressed in the interval. Left subclavian central venous catheter tip in the SVC unchanged. Pulmonary vascular congestion unchanged. Bibasilar atelectasis unchanged. IMPRESSION: Progression of small right pneumothorax. Right chest tube remains in place Pulmonary vascular congestion unchanged Bibasilar atelectasis unchanged. Electronically Signed   By: Franchot Gallo M.D.   On: 08/04/2015 07:49   Dg Chest Port 1 View  08/03/2015  CLINICAL DATA:  Right side chest tube. EXAM: PORTABLE CHEST 1 VIEW COMPARISON:  08/03/2015 FINDINGS: Right chest tube remains in place, unchanged. No visible pneumothorax. Support devices are stable. Prior CABG. Cardiomegaly with vascular congestion and bibasilar atelectasis. IMPRESSION: No visible right pneumothorax currently. Cardiomegaly with vascular congestion and bibasilar atelectasis. Electronically Signed   By: Rolm Baptise M.D.   On: 08/03/2015 12:19    Labs: BMET  Recent Labs Lab 08/02/15 0407 08/02/15 0520 08/02/15 1408 08/03/15 0435 08/03/15 0436 08/03/15 1630 08/04/15 0400 08/05/15 0410  NA 138 135 134* 132* 133* 133* 132* 133*  133*  K 3.3* 4.3 4.4 4.1 4.1 4.6 4.0 4.0  4.0  CL 113* 101 103 102 103 101 100* 100*  100*  CO2 20* 25 20* '23 22 23 23 24  24  '$ GLUCOSE 127* 148* 154* 158* 155* 147* 105* 121*  121*  BUN 41* 48* 51* 48* 49* 46* 43* 44*  44*  CREATININE 1.61* 1.93* 1.92* 1.85* 1.82* 1.85* 1.66* 1.63*  1.66*  CALCIUM 6.4* 8.2* 8.6* 8.3* 8.2* 8.5* 8.4* 8.3*  8.3*  PHOS 3.8 4.8* 4.9*  --  4.5 5.2* 4.4 4.8*  4.7*   CBC  Recent Labs Lab 08/02/15 0520 08/03/15 0435 08/04/15 0400 08/05/15 0410  WBC 16.3* 14.8* 17.8* 16.1*  HGB 8.6* 8.1* 8.2* 8.8*  HCT 27.9* 26.2* 27.0* 28.6*  MCV 93.6 93.9 94.4 95.3  PLT 130* 129* 127* 114*     Medications:    . sodium chloride   Intravenous Once  . antiseptic oral rinse  7 mL Mouth Rinse 10 times per day  . chlorhexidine gluconate (SAGE KIT)  15 mL Mouth Rinse BID  . darbepoetin (ARANESP) injection - NON-DIALYSIS  100 mcg Subcutaneous Q Fri-1800  . feeding supplement (NEPRO CARB STEADY)  1,000 mL Per Tube Q24H  . feeding supplement (PRO-STAT SUGAR FREE 64)  60 mL Per Tube QID  . fluconazole (DIFLUCAN) IV  100 mg Intravenous Q24H  . insulin aspart  0-9 Units Subcutaneous Q4H  . insulin glargine  25 Units Subcutaneous BID  . levalbuterol  1.25 mg Nebulization Q6H  . pantoprazole sodium  40 mg Per Tube Daily  . QUEtiapine  50 mg Oral BID  . sodium chloride flush  10-40 mL Intracatheter Q12H  . vancomycin  500 mg Oral Q6H  . vancomycin  1,000 mg Intravenous Q24H   Seen on CRRT '@735am'$  Qb122m/min Pre/post 350/300 ml/hr Qd 15093mhr Net UF even No changes; will not tolerate increase in UF.Seen on CRRT '@845am'$     JaOtelia SanteeMD 08/05/2015, 7:34 AM

## 2015-08-05 NOTE — Progress Notes (Signed)
13 Days Post-Op Procedure(s) (LRB): STERNAL CLOSURE WITH PUMP STANDBY (N/A) TRANSESOPHAGEAL ECHOCARDIOGRAM (TEE) (N/A) Subjective: Patient with little change with his status mainly affected by acute on chronic liver failure from cirrhosis He is deeply jaundiced with bilirubin stable at 45 for several days. He has vasodilatation on norepinephrine. He remains somnolent but does interact with his family and his nurses. Evaluation by GI is appreciated--prognosis for recovery of his liver function is poor.  I had a family conference with his daughter April and his girlfriend-fianc. They understand that his cirrhosis-liver failure is not expected to improve and that his likelihood of recovery is very small. The daughter does not wish to withdraw support since the patient is still interactive with her and shows clear recognition and engagement. Because of the poor prognosis from liver failure the family has agreed to proceed with a limited DNR   [no cardioversion, no CPR] but  wish to continue his current level support including hemodialysis.  Objective: Vital signs in last 24 hours: Temp:  [98.1 F (36.7 C)-99.2 F (37.3 C)] 99.2 F (37.3 C) (06/29 1617) Pulse Rate:  [39-92] 90 (06/29 1619) Cardiac Rhythm:  [-] Normal sinus rhythm (06/29 1745) Resp:  [12-42] 24 (06/29 1745) BP: (126-152)/(35-52) 152/47 mmHg (06/29 1619) SpO2:  [100 %] 100 % (06/29 1745) Arterial Line BP: (72-275)/(29-271) 107/35 mmHg (06/29 1745) FiO2 (%):  [40 %] 40 % (06/29 1745) Weight:  [215 lb 9.8 oz (97.8 kg)] 215 lb 9.8 oz (97.8 kg) (06/29 0424)  Hemodynamic parameters for last 24 hours: CVP:  [10 mmHg-14 mmHg] 14 mmHg  Intake/Output from previous day: 06/28 0701 - 06/29 0700 In: 4584.1 [I.V.:2479.1; Blood:250; NG/GT:1455; IV Piggyback:400] Out: [id number] [Stool:200; Chest Tube:170] Intake/Output this shift: Total I/O In: 1675.3 [I.V.:1045.3; NG/GT:580; IV Piggyback:50] Out: [id number]; Stool:100]  Deeply  jaundiced Intermittently opens his eyes but remains on ventilator  Lab Results:  Recent Labs  08/04/15 0400 08/05/15 0410  WBC 17.8* 16.1*  HGB 8.2* 8.8*  HCT 27.0* 28.6*  PLT 127* 114*   BMET:  Recent Labs  08/05/15 0410 08/05/15 1620  NA 133*  133* 132*  K 4.0  4.0 4.0  CL 100*  100* 101  CO2 24  24 23   GLUCOSE 121*  121* 148*  BUN 44*  44* 42*  CREATININE 1.63*  1.66* 1.78*  CALCIUM 8.3*  8.3* 8.5*    PT/INR:  Recent Labs  08/03/15 1815  LABPROT 18.9*  INR 1.58*   ABG    Component Value Date/Time   PHART 7.377 08/05/2015 0402   HCO3 25.4* 08/05/2015 0402   TCO2 27 08/05/2015 0402   ACIDBASEDEF 1.0 08/02/2015 2005   O2SAT 65.9 08/05/2015 0410   CBG (last 3)   Recent Labs  08/05/15 0809 08/05/15 1137 08/05/15 1614  GLUCAP 123* 112* 160*    Assessment/Plan: S/P Procedure(s) (LRB): STERNAL CLOSURE WITH PUMP STANDBY (N/A) TRANSESOPHAGEAL ECHOCARDIOGRAM (TEE) (N/A) Continue current level of care including hemodialysis and drips but no CPR or shocks   LOS: 30 days    Tharon Aquas Trigt III 08/05/2015

## 2015-08-05 NOTE — Progress Notes (Signed)
Daily Rounding Note  08/05/2015, 8:38 AM  LOS: 30 days   SUBJECTIVE:       No changes overnight  OBJECTIVE:         Vital signs in last 24 hours:    Temp:  [98.1 F (36.7 C)-99.2 F (37.3 C)] 99.1 F (37.3 C) (06/29 0816) Pulse Rate:  [39-93] 92 (06/29 0745) Resp:  [12-41] 32 (06/29 0800) BP: (126-131)/(35-52) 131/52 mmHg (06/29 0430) SpO2:  [100 %] 100 % (06/29 0807) Arterial Line BP: (83-275)/(30-271) 91/34 mmHg (06/29 0645) FiO2 (%):  [40 %] 40 % (06/29 0807) Weight:  [97.8 kg (215 lb 9.8 oz)] 97.8 kg (215 lb 9.8 oz) (06/29 0424) Last BM Date: 08/04/15 Filed Weights   08/03/15 0500 08/04/15 0416 08/05/15 0424  Weight: 97 kg (213 lb 13.5 oz) 98.6 kg (217 lb 6 oz) 97.8 kg (215 lb 9.8 oz)   General: jaundiced, critically ill.     Heart: RRR Chest: coarse BS, trach'd on vent   Abdomen: soft, tender in epigastrum/RUQ.  BS somewhat tympanitic and high pitched Extremities: edema in UE is non-pitting.   Neuro/Psych:  Opens eyes to voice.  Not following commands.  Moves all 4 limbs.   Intake/Output from previous day: 06/28 0701 - 06/29 0700 In: 4584.1 [I.V.:2479.1; Blood:250; NG/GT:1455; IV Piggyback:400] Out: 0814 [Stool:200; Chest Tube:170]  Intake/Output this shift: Total I/O In: 142.9 [I.V.:102.9; NG/GT:40] Out: 139 [Other:139]  Lab Results:  Recent Labs  08/03/15 0435 08/04/15 0400 08/05/15 0410  WBC 14.8* 17.8* 16.1*  HGB 8.1* 8.2* 8.8*  HCT 26.2* 27.0* 28.6*  PLT 129* 127* 114*   BMET  Recent Labs  08/03/15 1630 08/04/15 0400 08/05/15 0410  NA 133* 132* 133*  133*  K 4.6 4.0 4.0  4.0  CL 101 100* 100*  100*  CO2 '23 23 24  24  '$ GLUCOSE 147* 105* 121*  121*  BUN 46* 43* 44*  44*  CREATININE 1.85* 1.66* 1.63*  1.66*  CALCIUM 8.5* 8.4* 8.3*  8.3*   LFT  Recent Labs  08/03/15 0435  08/03/15 1630 08/04/15 0400 08/05/15 0410  PROT 5.3*  --   --  5.5* 5.1*  ALBUMIN 1.8*  < >  1.8* 1.8* 1.8*  1.8*  AST 262*  --   --  249* 238*  ALT 95*  --   --  84* 81*  ALKPHOS 999*  --   --  1227* 1259*  BILITOT 43.7*  --   --  45.2* 44.8*  BILIDIR  --   --   --  28.9*  --   < > = values in this interval not displayed. PT/INR  Recent Labs  08/03/15 1815  LABPROT 18.9*  INR 1.58*   Hepatitis Panel No results for input(s): HEPBSAG, HCVAB, HEPAIGM, HEPBIGM in the last 72 hours.  Studies/Results: US Abdomen Complete  08/04/2015  CLINICAL DATA:  70 year old male with cirrhosis and elevated LFTs. EXAM: ABDOMEN ULTRASOUND COMPLETE COMPARISON:  Ultrasound dated 07/22/2015 FINDINGS: Evaluation of the study is limited due to portable technique and vented patient who is not able to cooperate with the exam. Gallbladder: There is gallstone. There is no gallbladder wall thickening or pericholecystic fluid. Negative sonographic Murphy's sign. Common bile duct: Diameter: 5 mm Liver: There is irregular hepatic contour with coarsened echotexture compatible with morphologic changes of cirrhosis. IVC: No abnormality visualized. Pancreas: Not well visualized Spleen: Mildly enlarged measuring 15 cm in greatest length. A small splenule is noted.  Right Kidney: Length: 10 cm. There is moderate renal parenchymal atrophy with slight increased echotexture. No hydronephrosis or echogenic stone. Multiple small hypoechoic lesions measuring up to 1 cm are not well evaluated but may represent cysts. Left Kidney: Length: 10 cm. There is mild diffuse cortical thinning with mild increased echotexture. No hydronephrosis or echogenic stone. Abdominal aorta: No definite aneurysm on limited evaluation. Other findings: There is small ascites. Small bilateral pleural effusions noted. IMPRESSION: Cirrhosis with evidence of portal hypertension, mild splenomegaly, and small ascites. Cholelithiasis without definite sonographic evidence acute cholecystitis. A hepatobiliary scintigraphy may provide better evaluation of the  gallbladder if an acute cholecystitis is clinically suspected. No hydronephrosis or echogenic stone. Small bilateral pleural effusions. Electronically Signed   By: Anner Crete M.D.   On: 08/04/2015 04:04   Dg Chest Port 1 View  08/05/2015  CLINICAL DATA:  Respiratory failure EXAM: PORTABLE CHEST 1 VIEW COMPARISON:  08/04/2015 FINDINGS: Small right lateral and right apical pneumothorax is slightly larger. Right chest tube remains in place. Tracheostomy in good position. Central venous catheter tip in the proximal SVC. Feeding tube enters the stomach. Pulmonary vascular congestion with diffuse bilateral airspace disease is unchanged. Mild bibasilar atelectasis. No significant pleural effusion. IMPRESSION: Right pneumothorax is slightly larger today. Right chest tube remains in place. Diffuse bilateral airspace disease unchanged.  Probable edema. Electronically Signed   By: Franchot Gallo M.D.   On: 08/05/2015 07:38   Dg Chest Port 1 View  08/04/2015  CLINICAL DATA:  Chest tube. EXAM: PORTABLE CHEST 1 VIEW COMPARISON:  08/03/2015 FINDINGS: Tracheostomy in good position. Feeding tube enters the stomach with the tip not visualized. Right subclavian central venous catheter in the proximal SVC. Right chest tube in place. Small pneumothorax in the right lateral lung base and right apex has progressed in the interval. Left subclavian central venous catheter tip in the SVC unchanged. Pulmonary vascular congestion unchanged. Bibasilar atelectasis unchanged. IMPRESSION: Progression of small right pneumothorax. Right chest tube remains in place Pulmonary vascular congestion unchanged Bibasilar atelectasis unchanged. Electronically Signed   By: Franchot Gallo M.D.   On: 08/04/2015 07:49   Dg Chest Port 1 View  08/03/2015  CLINICAL DATA:  Right side chest tube. EXAM: PORTABLE CHEST 1 VIEW COMPARISON:  08/03/2015 FINDINGS: Right chest tube remains in place, unchanged. No visible pneumothorax. Support devices are  stable. Prior CABG. Cardiomegaly with vascular congestion and bibasilar atelectasis. IMPRESSION: No visible right pneumothorax currently. Cardiomegaly with vascular congestion and bibasilar atelectasis. Electronically Signed   By: Rolm Baptise M.D.   On: 08/03/2015 12:19   Scheduled Meds: . sodium chloride   Intravenous Once  . antiseptic oral rinse  7 mL Mouth Rinse 10 times per day  . chlorhexidine gluconate (SAGE KIT)  15 mL Mouth Rinse BID  . darbepoetin (ARANESP) injection - NON-DIALYSIS  100 mcg Subcutaneous Q Fri-1800  . feeding supplement (NEPRO CARB STEADY)  1,000 mL Per Tube Q24H  . feeding supplement (PRO-STAT SUGAR FREE 64)  60 mL Per Tube QID  . fluconazole (DIFLUCAN) IV  100 mg Intravenous Q24H  . insulin aspart  0-9 Units Subcutaneous Q4H  . insulin glargine  25 Units Subcutaneous BID  . levalbuterol  1.25 mg Nebulization Q6H  . pantoprazole sodium  40 mg Per Tube Daily  . QUEtiapine  50 mg Oral BID  . sodium chloride flush  10-40 mL Intracatheter Q12H  . vancomycin  500 mg Oral Q6H  . vancomycin  1,000 mg Intravenous Q24H   Continuous  Infusions: . sodium chloride 20 mL/hr at 08/05/15 0800  . EPINEPHrine 4 mg in dextrose 5% 250 mL infusion (16 mcg/mL) 8 mcg/min (08/05/15 0800)  . fentaNYL infusion INTRAVENOUS 100 mcg/hr (08/05/15 0800)  . midazolam (VERSED) infusion Stopped (07/26/15 0852)  . milrinone 0.125 mcg/kg/min (08/05/15 0800)  . norepinephrine (LEVOPHED) Adult infusion 36 mcg/min (08/05/15 0816)  . dialysis replacement fluid (prismasate) 300 mL/hr at 08/04/15 2246  . dialysis replacement fluid (prismasate) 350 mL/hr at 08/05/15 0151  . dialysate (PRISMASATE) 1,500 mL/hr at 08/05/15 0540  . vasopressin (PITRESSIN) infusion - *FOR SHOCK* 0.01 Units/min (08/05/15 0800)   PRN Meds:.fentaNYL, fentaNYL (SUBLIMAZE) injection, Gerhardt's butt cream, heparin, hydrOXYzine, metoprolol, midazolam, ondansetron (ZOFRAN) IV, sodium chloride, sodium chloride flush, sodium  chloride flush, traMADol  ASSESMENT:   *  Hyperbilirubinemia, predominantly indirect with elevated alk phos.  Cirrhosis, uncomplicated cholelithiasis, small ascites by ultrasound (bridging fibrosis on biopsy in 2015).   ? Acute rise in LFTs due to blood transfusion Previous extensive studies as to source of liver disease unrevealing, repeat studies ordered: CMV < 30. Ferritin > 7500.  AMA, ANA, Smooth muscle Ab, EBV all pending.      Coags slightly elevated but overall improved.  Ammonia normal.    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  On Aranesp  *  C diff +.  On oral vanc day 2.   *  Chronic, longstanding thrombocytopenia.   *  ESRD, currently on CVVH.    *  Protein cal malnutrition. On tube feeds.   *  resp failure.  Right ptx: chest tube in place. S/p trach, on vent. Bil airspace disease.    *  Shock, sepsis.  Increasing pressor requirements (epi, Vasopressin, Leophed).     PLAN   *  Per Dr Carita Pian  08/05/2015, 8:38 AM Pager: [phone]

## 2015-08-05 NOTE — Progress Notes (Signed)
PULMONARY / CRITICAL CARE MEDICINE   Name: Joseph [name] MRN: [id number] DOB: 18-Feb-1945    ADMISSION DATE:  07/04/2015 CONSULTATION DATE:  07/16/2015  REFERRING MD:  Nils Pyle  CHIEF COMPLAINT:  Post cardiothoracic surgery ventilator management  subj   70 y/o male with ESRD and significant CAD (CABG 2001, DES 2016, EF 40-45%) was admitted on 5/29 with NSTEMI and had persistent chest pain during his hospitalization so he was taken for a redo CABG on 6/8 (SVG to diag and SVG to OM, IABP placement and CVL placement).  Post operatively he had excessive bleeding from all chest drains (pleural and mediastinal) so he was taken back to the OR on 6/9 for re-exploration.  PCCM was consulted for ventilator management.     STUDIES:  6/14 TTE >EF 30-35%  CULTURES: Sputum 6/10 >> neg  ANTIBIOTICS: 6/8 Vanc >>6/16; 6/18 >> 6/26 ? Renal dose 6/10 Zosyn >>6/16 Primaxin 6/16>>> Flagyl 6/27>> Diflucan 1 dose 6/27>      LINES/TUBES: Left forearm fistula 6/8 Multiple chest tubes (mediastinal and pleural bilateral) >> 6/8 CVC left femoral vein >>out 6/8 R radial arterial line >>6/17>>>6/17>>> 6/8 L IJ [place] >>6/17 L [name] TLC 6/16>>> R  HD catheter 6/23 >>  Trach 6/20 >> changed 6/27>>  SIGNIFICANT EVENTS: 6/8 Redo CABG 6/8 take back to OR emergently for diffuse bleeding 6/9 - Rx CRRT 6/10 - start paralysis continuous 6/14 - dc paralysis 6/16 back to OR to close sternum 6/17 - dc IABP 6/20 - s/p trach  YACOUB 07/28/15 - post pyloric tube 07/31/15  - rt subclavian trialysis catgh 6/25:  > norepi up to 40, epi down to 6. CVVHD running. He tolerates PSV, is tachypneic no matter what vent mode mode we use. Note rare fungus on resp cx 6/26: on trach. On CRRT;keeping even On pressors - epi 6. Levophed 40-50 for sbp > 100. On precedex. Not following commands. Off precerdex wob increases. On PRVC; RN says does well on day time SBT. VERY JAUNDICED 6/27 trach exchange 6/28  - LOS 29 days.  10% rt ptx and Dr Lucianne Lei Tright placed chest tube.  CRT contnues.  Per RN on levophed, epi, vasipresson and milrinone. Cuff leak with trach +, trach changed 6/27    SUBJECTIVE/OVERNIGHT/INTERVAL HX 08/05/15 - LOS 30 days. Multiple pressors - levophed, epi and vasopression and milrinone. GI has dx with c diff and cirrhosis with MELD 40 . They are advising xifaxan. CRRT continues and daughte April at bedside witout questions  VITAL SIGNS: BP 131/52 mmHg  Pulse 92  Temp(Src) 99.1 F (37.3 C) (Axillary)  Resp 21  Ht 5\' 11"  (1.803 m)  Wt 97.8 kg (215 lb 9.8 oz)  BMI 30.08 kg/m2  SpO2 100%  HEMODYNAMICS: CVP:  [10 mmHg-12 mmHg] 12 mmHg  VENTILATOR SETTINGS: Vent Mode:  [-] PRVC FiO2 (%):  [40 %] 40 % Set Rate:  [16 bmp] 16 bmp Vt Set:  [500 mL] 500 mL PEEP:  [5 cmH20] 5 cmH20 Plateau Pressure:  [11 cmH20-20 cmH20] 11 cmH20  INTAKE / OUTPUT: I/O last 3 completed shifts: In: 6458.5 [I.V.:3663.5; Blood:250; NG/GT:1745; IV [id number] Out: [id number] [Other:5508; Stool:325; Chest Tube:270]  PHYSICAL EXAMINATION: General:  Critically ill on vent, CVVHD, jaundiced. Icteric.  Neuro:  Sedated, follows commands HEENT:  Scleral icterus.  TRACH in place Cardiovascular:  median sternotomy dressing dr. Kermit Balo s1/s2. (-) s3/m/r/g Lungs:  Fair ae.On PRVC rr 40 Abdomen:  Distended, some focal superficial bruising, dec BS Musculoskeletal:  No bony abnormalitiies  Skin:  Bruising all over extensor surfaces and belly, Gr 1 edema  LABS:  PULMONARY  Recent Labs Lab 08/03/15 0423  08/03/15 1227 08/03/15 2120 08/04/15 0355 08/04/15 0410 08/05/15 0402 08/05/15 0410  PHART 7.476*  --  7.423 7.433 7.413  --  7.377  --   PCO2ART 34.9*  --  37.0 40.3 39.5  --  43.2  --   PO2ART 92.0  --  94.0 353.0* 76.4*  --  88.0  --   HCO3 25.8*  --  24.4* 27.0* 24.7*  --  25.4*  --   TCO2 27  --  26 28 25.9  --  27  --   O2SAT 98.0  < > 98.0 100.0 95.2 67.5 96.0 65.9  < > = values in this interval not  displayed.  CBC  Recent Labs Lab 08/03/15 0435 08/04/15 0400 08/05/15 0410  HGB 8.1* 8.2* 8.8*  HCT 26.2* 27.0* 28.6*  WBC 14.8* 17.8* 16.1*  PLT 129* 127* 114*    COAGULATION  Recent Labs Lab 08/03/15 1815  INR 1.58*    CARDIAC  No results for input(s): TROPONINI in the last 168 hours. No results for input(s): PROBNP in the last 168 hours.   CHEMISTRY  Recent Labs Lab 08/02/15 0407 08/02/15 0520 08/02/15 1408 08/03/15 0435 08/03/15 0436 08/03/15 1630 08/04/15 0400 08/05/15 0410  NA 138 135 134* 132* 133* 133* 132* 133*  133*  K 3.3* 4.3 4.4 4.1 4.1 4.6 4.0 4.0  4.0  CL 113* 101 103 102 103 101 100* 100*  100*  CO2 20* 25 20* 23 22 23 23 24  24   GLUCOSE 127* 148* 154* 158* 155* 147* 105* 121*  121*  BUN 41* 48* 51* 48* 49* 46* 43* 44*  44*  CREATININE 1.61* 1.93* 1.92* 1.85* 1.82* 1.85* 1.66* 1.63*  1.66*  CALCIUM 6.4* 8.2* 8.6* 8.3* 8.2* 8.5* 8.4* 8.3*  8.3*  MG 2.0 2.6*  --  2.7*  --   --  2.6* 2.5*  PHOS 3.8 4.8* 4.9*  --  4.5 5.2* 4.4 4.8*  4.7*   Estimated Creatinine Clearance: 51.5 mL/min (by C-G formula based on Cr of 1.66).   LIVER  Recent Labs Lab 08/02/15 0407 08/02/15 0520  08/03/15 0435 08/03/15 0436 08/03/15 1630 08/03/15 1815 08/04/15 0400 08/05/15 0410  AST 224* 287*  --  262*  --   --   --  249* 238*  ALT 77* 101*  --  95*  --   --   --  84* 81*  ALKPHOS 760* 1066*  --  999*  --   --   --  1227* 1259*  BILITOT QUESTIONABLE RESULTS, RECOMMEND RECOLLECT TO VERIFY 41.9*  --  43.7*  --   --   --  45.2* 44.8*  PROT 4.1* 5.3*  --  5.3*  --   --   --  5.5* 5.1*  ALBUMIN 1.4* 1.7*  < > 1.8* 1.8* 1.8*  --  1.8* 1.8*  1.8*  INR  --   --   --   --   --   --  1.58*  --   --   < > = values in this interval not displayed.   INFECTIOUS  Recent Labs Lab 08/04/15 0009  LATICACIDVEN 0.8     ENDOCRINE CBG (last 3)   Recent Labs  08/04/15 2352 08/05/15 0354 08/05/15 0809  GLUCAP 171* 118* 123*         IMAGING  x48h  - image(s)  personally visualized  -   highlighted in bold US Abdomen Complete  08/04/2015  CLINICAL DATA:  70 year old male with cirrhosis and elevated LFTs. EXAM: ABDOMEN ULTRASOUND COMPLETE COMPARISON:  Ultrasound dated 07/22/2015 FINDINGS: Evaluation of the study is limited due to portable technique and vented patient who is not able to cooperate with the exam. Gallbladder: There is gallstone. There is no gallbladder wall thickening or pericholecystic fluid. Negative sonographic Murphy's sign. Common bile duct: Diameter: 5 mm Liver: There is irregular hepatic contour with coarsened echotexture compatible with morphologic changes of cirrhosis. IVC: No abnormality visualized. Pancreas: Not well visualized Spleen: Mildly enlarged measuring 15 cm in greatest length. A small splenule is noted. Right Kidney: Length: 10 cm. There is moderate renal parenchymal atrophy with slight increased echotexture. No hydronephrosis or echogenic stone. Multiple small hypoechoic lesions measuring up to 1 cm are not well evaluated but may represent cysts. Left Kidney: Length: 10 cm. There is mild diffuse cortical thinning with mild increased echotexture. No hydronephrosis or echogenic stone. Abdominal aorta: No definite aneurysm on limited evaluation. Other findings: There is small ascites. Small bilateral pleural effusions noted. IMPRESSION: Cirrhosis with evidence of portal hypertension, mild splenomegaly, and small ascites. Cholelithiasis without definite sonographic evidence acute cholecystitis. A hepatobiliary scintigraphy may provide better evaluation of the gallbladder if an acute cholecystitis is clinically suspected. No hydronephrosis or echogenic stone. Small bilateral pleural effusions. Electronically Signed   By: Anner Crete M.D.   On: 08/04/2015 04:04   Dg Chest Port 1 View  08/05/2015  CLINICAL DATA:  Respiratory failure EXAM: PORTABLE CHEST 1 VIEW COMPARISON:  08/04/2015 FINDINGS: Small right lateral and  right apical pneumothorax is slightly larger. Right chest tube remains in place. Tracheostomy in good position. Central venous catheter tip in the proximal SVC. Feeding tube enters the stomach. Pulmonary vascular congestion with diffuse bilateral airspace disease is unchanged. Mild bibasilar atelectasis. No significant pleural effusion. IMPRESSION: Right pneumothorax is slightly larger today. Right chest tube remains in place. Diffuse bilateral airspace disease unchanged.  Probable edema. Electronically Signed   By: Franchot Gallo M.D.   On: 08/05/2015 07:38   Dg Chest Port 1 View  08/04/2015  CLINICAL DATA:  Chest tube. EXAM: PORTABLE CHEST 1 VIEW COMPARISON:  08/03/2015 FINDINGS: Tracheostomy in good position. Feeding tube enters the stomach with the tip not visualized. Right subclavian central venous catheter in the proximal SVC. Right chest tube in place. Small pneumothorax in the right lateral lung base and right apex has progressed in the interval. Left subclavian central venous catheter tip in the SVC unchanged. Pulmonary vascular congestion unchanged. Bibasilar atelectasis unchanged. IMPRESSION: Progression of small right pneumothorax. Right chest tube remains in place Pulmonary vascular congestion unchanged Bibasilar atelectasis unchanged. Electronically Signed   By: Franchot Gallo M.D.   On: 08/04/2015 07:49   Dg Chest Port 1 View  08/03/2015  CLINICAL DATA:  Right side chest tube. EXAM: PORTABLE CHEST 1 VIEW COMPARISON:  08/03/2015 FINDINGS: Right chest tube remains in place, unchanged. No visible pneumothorax. Support devices are stable. Prior CABG. Cardiomegaly with vascular congestion and bibasilar atelectasis. IMPRESSION: No visible right pneumothorax currently. Cardiomegaly with vascular congestion and bibasilar atelectasis. Electronically Signed   By: Rolm Baptise M.D.   On: 08/03/2015 12:19       DISCUSSION: 70 y/o male s/p redo CABG 6/8 complicated by significant post operative  bleeding in the setting of thrombocytopenia brought back to the Surgical ICU on 6/9 with hypoxemic respiratory failure.  He has baseline ESRD and  DM2.  ASSESSMENT / PLAN:  PULMONARY A: Acute hypoxemic respiratory failure 2/2  Pulm edema, possible HCAP S/P Trache 6/20 by H[facility] Bible Post operative pleural bleeding   - on PRVC  40% fio2, peep 5. Curff leak + via trach. New Rt ptx - chest tube by CVTS  P:    Cont vent support. Daily PST as tolerated. Sedation being cut down. Consider making maintenance mode high PS given asynchrony w PRVC Intermittent CXR Chest tubes per thoracic surgery, replaced CT per surgery.  CARDIOVASCULAR A:  Severe Cardiogenic and hemorrhagic shock CAD s/p redo CABG IABP d/c'd on 6/18   on multiple pressors P:  Cont levophed and epi , vasio and milrinone drips, wean as able - goal sbp > 100 Tele monitoring. Amiodarone off  RENAL A:   ESRD CKD V   - on CRRT P:   Continue CVVHD per renal -- tolerating volume removal on pressors Renal following Replace electrolytes as indicated.  GASTROINTESTINAL A:   Jaundice and coagulopathy due to shocked liver. P:   OG tube TPN per pharmacy due to high residuals. H2 blocker for stress ulcer prophylaxis Abdominal U/S negative for obstruction, likely shocked liver.   HEMATOLOGIC  Recent Labs  08/04/15 0400 08/05/15 0410  HGB 8.2* 8.8*    A:   Diffuse oozing due to coagulopathy from blood loss, thrombocytopenia. Improved.  P:  Transfuse per TCTS guidelines. Trend CBC, coags.   INFECTIOUS Results for orders placed or performed during the hospital encounter of 06/25/2015  MRSA PCR Screening     Status: None   Collection Time: 06/11/2015  8:50 PM  Result Value Ref Range Status   MRSA by PCR NEGATIVE NEGATIVE Final    Comment:        The GeneXpert MRSA Assay (FDA approved for NASAL specimens only), is one component of a comprehensive MRSA colonization surveillance program. It is not intended to  diagnose MRSA infection nor to guide or monitor treatment for MRSA infections.   Surgical pcr screen     Status: None   Collection Time: 07/14/15  6:22 PM  Result Value Ref Range Status   MRSA, PCR NEGATIVE NEGATIVE Final   Staphylococcus aureus NEGATIVE NEGATIVE Final    Comment:        The Xpert SA Assay (FDA approved for NASAL specimens in patients over 89 years of age), is one component of a comprehensive surveillance program.  Test performance has been validated by [facility] for patients greater than or equal to 3 year old. It is not intended to diagnose infection nor to guide or monitor treatment.   Culture, respiratory (NON-Expectorated)     Status: None   Collection Time: 07/17/15  4:51 AM  Result Value Ref Range Status   Specimen Description TRACHEAL ASPIRATE  Final   Special Requests Normal  Final   Gram Stain   Final    RARE WBC PRESENT, PREDOMINANTLY MONONUCLEAR NO ORGANISMS SEEN    Culture NO GROWTH 2 DAYS  Final   Report Status 08/03/2015 FINAL  Final  Aerobic Culture (superficial specimen)     Status: None   Collection Time: 07/20/2015 10:04 AM  Result Value Ref Range Status   Specimen Description WOUND CHEST  Final   Special Requests STERNAL WOUND POF VANCO AND ZOSYN  Final   Gram Stain   Final    RARE WBC PRESENT,BOTH PMN AND MONONUCLEAR NO ORGANISMS SEEN    Culture NO GROWTH 2 DAYS  Final   Report Status 07/26/2015 FINAL  Final  Culture, respiratory (NON-Expectorated)     Status: None   Collection Time: 07/22/2015  3:36 PM  Result Value Ref Range Status   Specimen Description TRACHEAL ASPIRATE  Final   Special Requests Normal  Final   Gram Stain   Final    FEW WBC PRESENT,BOTH PMN AND MONONUCLEAR NO ORGANISMS SEEN    Culture NO GROWTH 2 DAYS  Final   Report Status 07/26/2015 FINAL  Final  Culture, blood (single) w Reflex to ID Panel     Status: None   Collection Time: 08/06/2015  6:48 PM  Result Value Ref Range Status   Specimen Description  BLOOD CENTRAL LINE  Final   Special Requests BOTTLES DRAWN AEROBIC AND ANAEROBIC 10CC  Final   Culture NO GROWTH 5 DAYS  Final   Report Status 07/28/2015 FINAL  Final  Blood culture     Status: None   Collection Time: 07/29/15 12:02 PM  Result Value Ref Range Status   Specimen Description BLOOD RIGHT HAND  Final   Special Requests IN PEDIATRIC BOTTLE 3CC  Final   Culture NO GROWTH 5 DAYS  Final   Report Status 08/03/2015 FINAL  Final  Culture, respiratory (NON-Expectorated)     Status: None   Collection Time: 07/29/15  3:22 PM  Result Value Ref Range Status   Specimen Description TRACHEAL ASPIRATE  Final   Special Requests NONE  Final   Gram Stain   Final    ABUNDANT WBC PRESENT,BOTH PMN AND MONONUCLEAR NO ORGANISMS SEEN    Culture   Final    RARE CANDIDA TROPICALIS CRITICAL RESULT CALLED TO, READ BACK BY AND VERIFIED WITH: N ZAFARIS 07/31/15 @ 71 M VESTAL    Report Status 08/02/2015 FINAL  Final  Culture, respiratory (NON-Expectorated)     Status: None (Preliminary result)   Collection Time: 08/02/15  9:08 AM  Result Value Ref Range Status   Specimen Description TRACHEAL ASPIRATE  Final   Special Requests Normal  Final   Gram Stain   Final    RARE WBC PRESENT,BOTH PMN AND MONONUCLEAR NO ORGANISMS SEEN    Culture MODERATE YEAST IDENTIFICATION TO FOLLOW   Final   Report Status PENDING  Incomplete  Blood culture     Status: None (Preliminary result)   Collection Time: 08/02/15  2:08 PM  Result Value Ref Range Status   Specimen Description BLOOD RIGHT HAND  Final   Special Requests IN PEDIATRIC BOTTLE 1CC  Final   Culture NO GROWTH 3 DAYS  Final   Report Status PENDING  Incomplete  C difficile quick scan w PCR reflex     Status: Abnormal   Collection Time: 08/02/15 10:20 PM  Result Value Ref Range Status   C Diff antigen POSITIVE (A) NEGATIVE Final   C Diff toxin NEGATIVE NEGATIVE Final   C Diff interpretation   Final    C. difficile present, but toxin not detected.  This indicates colonization. In most cases, this does not require treatment. If patient has signs and symptoms consistent with colitis, consider treatment. Requires ENTERIC precautions.  Clostridium Difficile by PCR     Status: Abnormal   Collection Time: 08/02/15 10:20 PM  Result Value Ref Range Status   Toxigenic C Difficile by pcr POSITIVE (A) NEGATIVE Final    Comment: CRITICAL RESULT CALLED TO, READ BACK BY AND VERIFIED WITH: Retia Passe MD 14:20 08/04/15 ([email or web address])     A:   ?HCAP - doubt, respiratory culture negative Rare fungus in resp cx  Also C Dif 0 new  P:   Anti-infectives    Start     Dose/Rate Route Frequency Ordered Stop   08/05/15 1145  rifaximin (XIFAXAN) tablet 550 mg     550 mg Oral 3 times daily 08/05/15 1131 08/12/15 0959   08/04/15 1800  vancomycin (VANCOCIN) 50 mg/mL oral solution 125 mg  Status:  Discontinued     125 mg Oral Every 6 hours 08/04/15 1559 08/04/15 1559   08/04/15 1800  vancomycin (VANCOCIN) 50 mg/mL oral solution 500 mg     500 mg Oral Every 6 hours 08/04/15 1559     08/04/15 1100  fluconazole (DIFLUCAN) IVPB 100 mg     100 mg 50 mL/hr over 60 Minutes Intravenous Every 24 hours 08/04/15 0942     08/03/15 2200  vancomycin (VANCOCIN) IVPB 1000 mg/200 mL premix     1,000 mg 200 mL/hr over 60 Minutes Intravenous Every 24 hours 08/03/15 2118     08/03/15 1000  metroNIDAZOLE (FLAGYL) tablet 500 mg  Status:  Discontinued     500 mg Oral 3 times daily 08/03/15 0753 08/04/15 1559   08/03/15 0730  fluconazole (DIFLUCAN) IVPB 100 mg     100 mg 50 mL/hr over 60 Minutes Intravenous  Once 08/03/15 0721 08/03/15 0930   08/03/15 0000  vancomycin (VANCOCIN) IVPB 1000 mg/200 mL premix  Status:  Discontinued     1,000 mg 200 mL/hr over 60 Minutes Intravenous  Once 08/02/15 1012 08/02/15 1843   08/03/15 0000  vancomycin (VANCOCIN) IVPB 1000 mg/200 mL premix     1,000 mg 200 mL/hr over 60 Minutes Intravenous  Once 08/02/15 1843 08/02/15 2118   08/02/15 1845   vancomycin (VANCOCIN) IVPB 1000 mg/200 mL premix  Status:  Discontinued     1,000 mg 200 mL/hr over 60 Minutes Intravenous Every 12 hours 08/02/15 1842 08/02/15 1843   08/02/15 1330  fluconazole (DIFLUCAN) IVPB 100 mg     100 mg 50 mL/hr over 60 Minutes Intravenous  Once 08/02/15 1318 08/02/15 1512   08/01/15 2200  vancomycin (VANCOCIN) IVPB 1000 mg/200 mL premix     1,000 mg 200 mL/hr over 60 Minutes Intravenous  Once 08/01/15 1130 08/01/15 2333   07/31/15 1600  vancomycin (VANCOCIN) IVPB 1000 mg/200 mL premix     1,000 mg 200 mL/hr over 60 Minutes Intravenous  Once 07/31/15 1144 07/31/15 1656   07/30/15 1000  vancomycin (VANCOCIN) IVPB 1000 mg/200 mL premix     1,000 mg 200 mL/hr over 60 Minutes Intravenous  Once 07/30/15 0826 07/30/15 1022   07/26/15 0830  fluconazole (DIFLUCAN) IVPB 100 mg     100 mg 50 mL/hr over 60 Minutes Intravenous Every 24 hours 07/26/15 0726 07/27/15 1031   07/25/15 1830  vancomycin (VANCOCIN) IVPB 1000 mg/200 mL premix  Status:  Discontinued     1,000 mg 200 mL/hr over 60 Minutes Intravenous Every 24 hours 07/25/15 1332 07/29/15 1538   07/24/15 1400  imipenem-cilastatin (PRIMAXIN) 500 mg in sodium chloride 0.9 % 100 mL IVPB  Status:  Discontinued     500 mg 200 mL/hr over 30 Minutes Intravenous Every 8 hours 07/24/15 1139 08/04/15 2037   07/10/2015 1545  fluconazole (DIFLUCAN) tablet 100 mg     100 mg Oral  Once 07/17/2015 1534 07/14/2015 1617   08/06/2015 1300  piperacillin-tazobactam (ZOSYN) IVPB 2.25 g  Status:  Discontinued     2.25 g 100 mL/hr over 30 Minutes Intravenous Every 8 hours 08/02/2015 1224 07/24/15 1142  07/21/2015 0927  vancomycin (VANCOCIN) 1,000 mg in sodium chloride 0.9 % 1,000 mL irrigation  Status:  Discontinued       As needed 07/11/2015 0928 07/30/2015 1039   07/29/2015 0926  polymyxin B 500,000 Units, bacitracin 50,000 Units in sodium chloride irrigation 0.9 % 500 mL irrigation  Status:  Discontinued       As needed 08/01/2015 0927 07/29/2015 1039    08/01/2015 0745  vancomycin (VANCOCIN) 1,000 mg in sodium chloride 0.9 % 1,000 mL irrigation  Status:  Discontinued      Irrigation To Surgery 07/08/2015 0733 07/14/2015 1047   07/21/15 1115  vancomycin (VANCOCIN) IVPB 1000 mg/200 mL premix  Status:  Discontinued     1,000 mg 200 mL/hr over 60 Minutes Intravenous Every 24 hours 07/21/15 1101 07/10/2015 1224   07/18/15 1045  vancomycin (VANCOCIN) 1,250 mg in sodium chloride 0.9 % 250 mL IVPB  Status:  Discontinued     1,250 mg 166.7 mL/hr over 90 Minutes Intravenous Every 24 hours 07/18/15 1043 07/21/15 1101   07/17/15 0400  piperacillin-tazobactam (ZOSYN) IVPB 3.375 g  Status:  Discontinued     3.375 g 100 mL/hr over 30 Minutes Intravenous Every 6 hours 07/16/15 2134 08/05/2015 1224   07/16/15 2030  piperacillin-tazobactam (ZOSYN) IVPB 3.375 g  Status:  Discontinued     3.375 g 12.5 mL/hr over 240 Minutes Intravenous Every 8 hours 07/16/15 1856 07/16/15 2134   07/16/15 2000  vancomycin (VANCOCIN) IVPB 1000 mg/200 mL premix     1,000 mg 200 mL/hr over 60 Minutes Intravenous  Once 07/16/15 1858 07/16/15 2046   07/12/2015 2315  vancomycin (VANCOCIN) IVPB 1000 mg/200 mL premix  Status:  Discontinued     1,000 mg 200 mL/hr over 60 Minutes Intravenous  Once 07/21/2015 1911 07/16/15 1858   07/09/2015 2100  cefUROXime (ZINACEF) 1.5 g in dextrose 5 % 50 mL IVPB  Status:  Discontinued     1.5 g 100 mL/hr over 30 Minutes Intravenous Every 12 hours 07/10/2015 1911 07/16/15 1856   07/27/2015 1130  cefUROXime (ZINACEF) 750 mg in dextrose 5 % 50 mL IVPB  Status:  Discontinued     750 mg 100 mL/hr over 30 Minutes Intravenous To Surgery 07/14/2015 1122 07/29/2015 1902   07/13/2015 0400  vancomycin (VANCOCIN) 1,250 mg in sodium chloride 0.9 % 250 mL IVPB  Status:  Discontinued     1,250 mg 166.7 mL/hr over 90 Minutes Intravenous To Surgery 07/14/15 1054 07/14/15 1435   07/31/2015 0400  cefUROXime (ZINACEF) 1.5 g in dextrose 5 % 50 mL IVPB  Status:  Discontinued     1.5 g 100 mL/hr  over 30 Minutes Intravenous To Surgery 07/14/15 1054 07/14/15 1435   07/15/2015 0400  cefUROXime (ZINACEF) 750 mg in dextrose 5 % 50 mL IVPB  Status:  Discontinued     750 mg 100 mL/hr over 30 Minutes Intravenous To Surgery 07/14/15 1053 07/14/15 1435   07/20/2015 0400  vancomycin (VANCOCIN) 1,500 mg in sodium chloride 0.9 % 250 mL IVPB     1,500 mg 125 mL/hr over 120 Minutes Intravenous To Surgery 07/14/15 1432 08/03/2015 0745   07/11/2015 0400  cefUROXime (ZINACEF) 1.5 g in dextrose 5 % 50 mL IVPB     1.5 g 100 mL/hr over 30 Minutes Intravenous To Surgery 07/14/15 1432 07/19/2015 1654   07/21/2015 0400  cefUROXime (ZINACEF) 750 mg in dextrose 5 % 50 mL IVPB  Status:  Discontinued     750 mg 100 mL/hr over 30 Minutes  Intravenous To Surgery 07/14/15 1429 08/06/2015 1902         ENDOCRINE CBG (last 3)   Recent Labs  08/04/15 2352 08/05/15 0354 08/05/15 0809  GLUCAP 171* 118* 123*     A:   DM2   P:   Monitor glucose Insulin gtt per TCTS post op protocol  NEUROLOGIC A:   Sedation needed  for vent synchrony P:   RASS goal: -2 to -3 Work to decrease sedation as able.   FAMILY  - Updates:  No family bedside 6/23 , 08/02/15, 08/03/15, 6/28. Daughter 08/05/15 denies questions - she plans to meet with dr Nils Pyle later -    GLOBAL Cirrhosis and MELD score 40 - 3 mo mortality is itself very ery high just on that. In addition PROVENT score - for 1 year mortality - age > 47, vent dependent > 21 days, platlets < 150, vasopressor need and CRRT  Suggests > 95- 100% 1 year mortality based on prior statisitics.   Very little contribution from pulmonary at this point. Consider EOL discussions    The patient is critically ill with multiple organ systems failure and requires high complexity decision making for assessment and support, frequent evaluation and titration of therapies, application of advanced monitoring technologies and extensive interpretation of multiple databases.   Critical Care  Time devoted to patient care services described in this note is  30  Minutes. This time reflects time of care of this signee Dr Brand Males. This critical care time does not reflect procedure time, or teaching time or supervisory time of PA/NP/Med student/Med Resident etc but could involve care discussion time    Dr. Brand Males, M.D., [facility]C.P Pulmonary and Critical Care Medicine Staff Physician [facility] Pager: [phone], If no answer or between  15:00h - 7:00h: call [phone]  08/05/2015 11:34 AM

## 2015-08-05 NOTE — Progress Notes (Addendum)
Nutrition Follow-up  DOCUMENTATION CODES:   Obesity unspecified  INTERVENTION:    Continue Nepro formula at goal rate of 20 ml/hr   Continue Prostat liquid protein 60 ml QID   Total TF regimen to provide 1664 kcals, 159 gm protein, 349 ml free water daily  NUTRITION DIAGNOSIS:   Inadequate oral intake related to inability to eat as evidenced by NPO status, ongoing  GOAL:   Patient will meet greater than or equal to 90% of their needs, met  MONITOR:   TF tolerance, Vent status, Labs, Weight trends, Skin, I & O's  ASSESSMENT:   Pt s/p redo CABG 6/8 complicated by significant post operative bleeding in the setting of thrombocytopenia brought back to the Surgical ICU on 6/9 with hypoxemic respiratory failure. He has baseline ESRD and DM2.    Patient s/p procedure 6/16: STERNAL CLOSURE PLACEMENT OF LEFT SUBCLAVIAN VEIN CENTRAL LINE  Patient is currently on ventilator support >> trach Temp (24hrs), Avg:98.6 F (37 C), Min:98.1 F (36.7 C), Max:99.2 F (37.3 C)   Pt continues on CVVHD, pressors. Nepro formula infusing at 20 ml/hr via CORTRAK small bore feeding (tip at proximal jejunum). Also receiving Prostat liquid protein 60 ml QID. Total TF regimen is providing 1664 kcals, 159 gm protein, 349 ml free water daily. Overall prognosis is poor.  Diet Order:  Diet NPO time specified  Skin:  Wound (see comment) (Chest incisions )  Last BM:  6/28  Height:   Ht Readings from Last 1 Encounters:  06/07/2015 '5\' 11"'$  (1.803 m)    Weight:   Wt Readings from Last 1 Encounters:  08/05/15 215 lb 9.8 oz (97.8 kg)    Ideal Body Weight:  78.1 kg  BMI:  Body mass index is 30.08 kg/(m^2).  Estimated Nutritional Needs:   Kcal:  [id number]   Protein:  150-160 gm  Fluid:  per MD  EDUCATION NEEDS:   No education needs identified at this time  Arthur Holms, RD, LDN Pager #: [phone] After-Hours Pager #: [phone]

## 2015-08-06 ENCOUNTER — Inpatient Hospital Stay (HOSPITAL_COMMUNITY): Payer: Medicare Other

## 2015-08-06 LAB — COMPREHENSIVE METABOLIC PANEL
ALT: 74 U/L — ABNORMAL HIGH (ref 17–63)
AST: 216 U/L — ABNORMAL HIGH (ref 15–41)
Albumin: 2 g/dL — ABNORMAL LOW (ref 3.5–5.0)
Alkaline Phosphatase: 1138 U/L — ABNORMAL HIGH (ref 38–126)
Anion gap: 10 (ref 5–15)
BUN: 43 mg/dL — ABNORMAL HIGH (ref 6–20)
CO2: 24 mmol/L (ref 22–32)
Calcium: 8.5 mg/dL — ABNORMAL LOW (ref 8.9–10.3)
Chloride: 98 mmol/L — ABNORMAL LOW (ref 101–111)
Creatinine, Ser: 1.69 mg/dL — ABNORMAL HIGH (ref 0.61–1.24)
GFR calc Af Amer: 47 mL/min — ABNORMAL LOW (ref >60)
GFR calc non Af Amer: 40 mL/min — ABNORMAL LOW (ref >60)
Glucose, Bld: 148 mg/dL — ABNORMAL HIGH (ref 65–99)
Potassium: 3.8 mmol/L (ref 3.5–5.1)
Sodium: 132 mmol/L — ABNORMAL LOW (ref 135–145)
Total Bilirubin: 42.4 mg/dL (ref 0.3–1.2)
Total Protein: 5.3 g/dL — ABNORMAL LOW (ref 6.5–8.1)

## 2015-08-06 LAB — CBC
HCT: 27.4 % — ABNORMAL LOW (ref 39.0–52.0)
Hemoglobin: 8.6 g/dL — ABNORMAL LOW (ref 13.0–17.0)
MCH: 30.7 pg (ref 26.0–34.0)
MCHC: 31.4 g/dL (ref 30.0–36.0)
MCV: 97.9 fL (ref 78.0–100.0)
Platelets: 108 10*3/uL — ABNORMAL LOW (ref 150–400)
RBC: 2.8 MIL/uL — ABNORMAL LOW (ref 4.22–5.81)
RDW: 24.2 % — ABNORMAL HIGH (ref 11.5–15.5)
WBC: 15.6 10*3/uL — ABNORMAL HIGH (ref 4.0–10.5)

## 2015-08-06 LAB — RENAL FUNCTION PANEL
ALBUMIN: 2 g/dL — AB (ref 3.5–5.0)
ALBUMIN: 2 g/dL — AB (ref 3.5–5.0)
ANION GAP: 10 (ref 5–15)
Anion gap: 9 (ref 5–15)
BUN: 43 mg/dL — ABNORMAL HIGH (ref 6–20)
BUN: 46 mg/dL — ABNORMAL HIGH (ref 6–20)
CALCIUM: 8.5 mg/dL — AB (ref 8.9–10.3)
CHLORIDE: 99 mmol/L — AB (ref 101–111)
CO2: 25 mmol/L (ref 22–32)
CO2: 22 mmol/L (ref 22–32)
CREATININE: 1.6 mg/dL — AB (ref 0.61–1.24)
Calcium: 8.5 mg/dL — ABNORMAL LOW (ref 8.9–10.3)
Chloride: 99 mmol/L — ABNORMAL LOW (ref 101–111)
Creatinine, Ser: 1.93 mg/dL — ABNORMAL HIGH (ref 0.61–1.24)
GFR calc non Af Amer: 34 mL/min — ABNORMAL LOW (ref >60)
GFR, EST AFRICAN AMERICAN: 50 mL/min — AB (ref >60)
GFR, EST AFRICAN AMERICAN: 40 mL/min — AB (ref >60)
GFR, EST NON AFRICAN AMERICAN: 43 mL/min — AB (ref >60)
Glucose, Bld: 146 mg/dL — ABNORMAL HIGH (ref 65–99)
Glucose, Bld: 170 mg/dL — ABNORMAL HIGH (ref 65–99)
PHOSPHORUS: 4.3 mg/dL (ref 2.5–4.6)
PHOSPHORUS: 5.1 mg/dL — AB (ref 2.5–4.6)
POTASSIUM: 3.8 mmol/L (ref 3.5–5.1)
POTASSIUM: 3.8 mmol/L (ref 3.5–5.1)
SODIUM: 131 mmol/L — AB (ref 135–145)
Sodium: 133 mmol/L — ABNORMAL LOW (ref 135–145)

## 2015-08-06 LAB — CARBOXYHEMOGLOBIN
Carboxyhemoglobin: 2.2 % — ABNORMAL HIGH (ref 0.5–1.5)
Methemoglobin: 0.4 % (ref 0.0–1.5)
O2 Saturation: 65 %
Total hemoglobin: 8.2 g/dL — ABNORMAL LOW (ref 13.5–18.0)

## 2015-08-06 LAB — GLUCOSE, CAPILLARY
GLUCOSE-CAPILLARY: 141 mg/dL — AB (ref 65–99)
GLUCOSE-CAPILLARY: 124 mg/dL — AB (ref 65–99)
GLUCOSE-CAPILLARY: 167 mg/dL — AB (ref 65–99)
Glucose-Capillary: 152 mg/dL — ABNORMAL HIGH (ref 65–99)
Glucose-Capillary: 165 mg/dL — ABNORMAL HIGH (ref 65–99)
Glucose-Capillary: 170 mg/dL — ABNORMAL HIGH (ref 65–99)

## 2015-08-06 LAB — POCT I-STAT 3, ART BLOOD GAS (G3+)
ACID-BASE DEFICIT: 3 mmol/L — AB (ref 0.0–2.0)
BICARBONATE: 23 meq/L (ref 20.0–24.0)
Bicarbonate: 25.1 mEq/L — ABNORMAL HIGH (ref 20.0–24.0)
O2 SAT: 94 %
O2 SAT: 93 %
PCO2 ART: 40.9 mmHg (ref 35.0–45.0)
PO2 ART: 73 mmHg — AB (ref 80.0–100.0)
Patient temperature: 98.5
TCO2: 26 mmol/L (ref 0–100)
TCO2: 24 mmol/L (ref 0–100)
pCO2 arterial: 43.9 mmHg (ref 35.0–45.0)
pH, Arterial: 7.396 (ref 7.350–7.450)
pH, Arterial: 7.327 — ABNORMAL LOW (ref 7.350–7.450)
pO2, Arterial: 73 mmHg — ABNORMAL LOW (ref 80.0–100.0)

## 2015-08-06 LAB — CALCIUM, IONIZED: Calcium, Ionized, Serum: 4.7 mg/dL (ref 4.5–5.6)

## 2015-08-06 LAB — VANCOMYCIN, TROUGH: Vancomycin Tr: 27 ug/mL (ref 15–20)

## 2015-08-06 LAB — MAGNESIUM: MAGNESIUM: 2.7 mg/dL — AB (ref 1.7–2.4)

## 2015-08-06 LAB — RSV(RESPIRATORY SYNCYTIAL VIRUS) AB, BLOOD

## 2015-08-06 MED ORDER — VANCOMYCIN HCL 10 G IV SOLR
1250.0000 mg | INTRAVENOUS | Status: DC
  Administered 2015-08-07 – 2015-08-09 (×2): 1250 mg via INTRAVENOUS
  Filled 2015-08-06 (×2): qty 1250

## 2015-08-06 NOTE — Progress Notes (Signed)
Daughter, April, expressed to RN the desire for pt's code status to change after counsel with Dr. Prescott Gum and discussion with family. Code status to be no CPR and no defib. RN communicated desire to MD. MD to change code status. Will continue to monitor closely.

## 2015-08-06 NOTE — Progress Notes (Signed)
ANTIBIOTIC CONSULT NOTE - Follow-Up  Pharmacy Consult for Vancomycin Indication: Sepsis, pneumonia  Allergies  Allergen Reactions  . Sulfa Antibiotics Hives and Itching  . Morphine And Related     Makes patient feel weird    Patient Measurements: Height: 5\' 11"  (180.3 cm) Weight: 211 lb 3.2 oz (95.8 kg) IBW/kg (Calculated) : 75.3 Adjusted Body Weight:   Vital Signs: Temp: 98.5 F (36.9 C) (06/30 2000) Temp Source: Axillary (06/30 2000) Pulse Rate: 76 (06/30 2045) Intake/Output from previous day: 06/29 0701 - 06/30 0700 In: 4089.3 [I.V.:2399.3; NG/GT:1440; IV Piggyback:250] Out: [id number] [Stool:400; Chest Tube:60] Intake/Output from this shift: Total I/O In: 608.7 [I.V.:308.7; NG/GT:300] Out: 528 [Other:428; Stool:100]  Labs:  Recent Labs  08/04/15 0400 08/05/15 0410 08/05/15 1620 08/06/15 0400 08/06/15 1528  WBC 17.8* 16.1*  --  15.6*  --   HGB 8.2* 8.8*  --  8.6*  --   PLT 127* 114*  --  108*  --   CREATININE 1.66* 1.63*  1.66* 1.78* 1.69*  1.60* 1.93*   Estimated Creatinine Clearance: 43.9 mL/min (by C-G formula based on Cr of 1.93).  Recent Labs  08/06/15 2110  VANCOTROUGH 79*     Admit Complaint: 70 yo M with known CAD admitted 5/29 with CP > NSTEMI>CABG 6/8. back to OR emergently 6/8 for diffuse bleeding. Received Factor VII and has received multiple transfusions.  VT is supratherapeutic at 27. Est CrCl ~30 ml/hr on CRRT.    6/8 Vanc >>  6/17 Primaxin >> 6/26 Fluconazole>6/27 (by MD, got 2 doses) 6/27 Flagyl>> 6/9 Zosyn>>6/17  6/23 VR 22, Vanco 1g x 1 6/24 VR 21 - Vanco 1g IV x 1 6/25: Vanco 1g IV x 1 (30.5 hr later) 6/26: Vanco 1g IV x 1 (22 hr later, RN gave 4 hrs earlier than scheduled?) 6/27: VR 20 6/30: VT 27 on 1g q24h  6/10 TA: ngF 6/7 surg mrsa pcr: neg 6/16 bld - ngtd 6/16 resp cx - ngF 6/22 BCx2: ngtd 6/22 RCx: rare yeast 6/26: Cdiff  antigen+, toxin negative 6/26: Trach aspirate: Moderate yeast  Goal of Therapy:   Vancomycin trough level 15-20 mcg/ml  Plan:  Will start vancomycin 1250 mg IV q48h tomorrow evening Follow-up plans for antibiotic length of therapy Follow-up plans for CRRT  [facility], Pharm.D., BCPS Clinical Pharmacist Pager: [phone] 08/06/2015 10:25 PM

## 2015-08-06 NOTE — Progress Notes (Signed)
[facility] Progress Note    Assessment/ Plan:   70 y.o. male with ESRD who was admitted 06/22/2015 with CP and subsequent redo CABG complicated by blood loss req transfusion and hemodynamic instability -s/p balloon pump, VDRF, shock, CRRT  1. CAD- complicated redo CABG- continues to be on pressors; high Levophed doses. 2. ESRD -TTS [facility]-on CRRT now since 6/17. Has left forearm AVF -Increased fluids pre filter fluid to prevent clotting. S/p 500cc bolus on 6/25. Continue to keep even with CRRT. - I doubt he would tolerate net UF especially with the 3rd spacing and pressor requirements --> continues to be on high Levophed doses (currently at 35mcg); therefore, continue to keep even. - bladder scan 6/27 --> 43ml which is not unexpected. - filter changed on 6/29  Seen on CRRT @1020am Qb100ml/min Pre/post 350/300 ml/hr Qd 1500ml/hr Net UF even No changes; will not tolerate increase in UF.   3. Anemia- has required transfusions- fairly stable h/h today. 4. HTN/volume- CRRT with volume removal (keep net even); fortunately able to at least keep even over past 24hrs. 5. Inc LFTs/hyperbilirubinemia  6. Alkalosis, improved off citrate, problematic usage with liver disease  Subjective:   Continues to be on the vent with worsening pressor requirements. Levophed increased to 31 mcg Also on Epinephrine and Vasopressin CVP mildly elevated.  25% right PTX 6/27 --> CTwith rush of air return Continues to be on Fentanyl.   Objective:   BP 152/47 mmHg  Pulse 82  Temp(Src) 98.4 F (36.9 C) (Axillary)  Resp 35  Ht 5' 11" (1.803 m)  Wt 95.8 kg (211 lb 3.2 oz)  BMI 29.47 kg/m2  SpO2 100%  Intake/Output Summary (Last 24 hours) at 08/06/15 1042 Last data filed at 08/06/15 1000  Gross per 24 hour  Intake 4236.61 ml  Output   4251 ml  Net -14.39 ml   Weight change: -2 kg (-4 lb 6.6 oz)  Physical Exam: General appearance: On vent Extremities: trace  to 1+ in the lower extremities. Jaundiced  RRR No BS but soft abdomen Rales posteriorly; decreased BS right posterior. Rt Chest tube  Imaging: Dg Chest Port 1 View  08/06/2015  CLINICAL DATA:  Chest tube.  CABG. EXAM: PORTABLE CHEST 1 VIEW COMPARISON:  07/17/2015. FINDINGS: Tracheostomy tube feeding tube bilateral central lines and right chest tube in stable position. Stable small right apical pneumothorax. Surgical staples right chest. Prior CABG. Cardiomegaly with progressive bilateral pulmonary infiltrates consistent pulmonary edema. Small right pleural effusion. IMPRESSION: 1. Lines and tubes including right chest tube in stable position. Stable right apical pneumothorax. 2. Prior CABG. Cardiomegaly with progressive bilateral pulmonary infiltrates consistent with progressive pulmonary edema. Small right pleural effusion. Electronically Signed   By: Thomas  Register   On: 08/06/2015 07:41   Dg Chest Port 1 View  08/05/2015  CLINICAL DATA:  Respiratory failure EXAM: PORTABLE CHEST 1 VIEW COMPARISON:  08/04/2015 FINDINGS: Small right lateral and right apical pneumothorax is slightly larger. Right chest tube remains in place. Tracheostomy in good position. Central venous catheter tip in the proximal SVC. Feeding tube enters the stomach. Pulmonary vascular congestion with diffuse bilateral airspace disease is unchanged. Mild bibasilar atelectasis. No significant pleural effusion. IMPRESSION: Right pneumothorax is slightly larger today. Right chest tube remains in place. Diffuse bilateral airspace disease unchanged.  Probable edema. Electronically Signed   By: Charles  Clark M.D.   On: 08/05/2015 07:38    Labs: BMET  Recent Labs Lab 08/02/15 1408 08/03/15 0435 08/03/15 0436 08/03/15 1630   08/04/15 0400 08/05/15 0410 08/05/15 1620 08/06/15 0400  NA 134* 132* 133* 133* 132* 133*  133* 132* 132*  133*  K 4.4 4.1 4.1 4.6 4.0 4.0  4.0 4.0 3.8  3.8  CL 103 102 103 101 100* 100*  100* 101  98*  99*  CO2 20* _0 GLUCOSE 154* 158* 155* 147* 105* 121*  121* 148* 148*  146*  BUN 51* 48* 49* 46* 43* 44*  44* 42* 43*  43*  CREATININE 1.92* 1.85* 1.82* 1.85* 1.66* 1.63*  1.66* 1.78* 1.69*  1.60*  CALCIUM 8.6* 8.3* 8.2* 8.5* 8.4* 8.3*  8.3* 8.5* 8.5*  8.5*  PHOS 4.9*  --  4.5 5.2* 4.4 4.8*  4.7* 4.8* 4.3   CBC  Recent Labs Lab 08/03/15 0435 08/04/15 0400 08/05/15 0410 08/06/15 0400  WBC 14.8* 17.8* 16.1* 15.6*  HGB 8.1* 8.2* 8.8* 8.6*  HCT 26.2* 27.0* 28.6* 27.4*  MCV 93.9 94.4 95.3 97.9  PLT 129* 127* 114* 108*    Medications:    . sodium chloride   Intravenous Once  . antiseptic oral rinse  7 mL Mouth Rinse 10 times per day  . chlorhexidine gluconate (SAGE KIT)  15 mL Mouth Rinse BID  . darbepoetin (ARANESP) injection - NON-DIALYSIS  100 mcg Subcutaneous Q Fri-1800  . feeding supplement (NEPRO CARB STEADY)  1,000 mL Per Tube Q24H  . feeding supplement (PRO-STAT SUGAR FREE 64)  60 mL Per Tube QID  . fluconazole (DIFLUCAN) IV  100 mg Intravenous Q24H  . insulin aspart  0-9 Units Subcutaneous Q4H  . insulin glargine  25 Units Subcutaneous BID  . levalbuterol  1.25 mg Nebulization Q6H  . pantoprazole sodium  40 mg Per Tube Daily  . QUEtiapine  50 mg Oral BID  . rifaximin  550 mg Oral TID  . sodium chloride flush  10-40 mL Intracatheter Q12H  . vancomycin  500 mg Oral Q6H  . vancomycin  1,000 mg Intravenous Q24H      Otelia Santee, MD 08/06/2015, 10:42 AM

## 2015-08-06 NOTE — Progress Notes (Signed)
PULMONARY / CRITICAL CARE MEDICINE   Name: Joseph [name] MRN: [id number] DOB: 05-11-1945    ADMISSION DATE:  06/12/2015 CONSULTATION DATE:  07/16/2015 LOS 83 days  REFERRING MD:  Nils Pyle  CHIEF COMPLAINT:  Post cardiothoracic surgery ventilator management  subj   70 y/o male with ESRD and significant CAD (CABG 2001, DES 2016, EF 40-45%) was admitted on 5/29 with NSTEMI and had persistent chest pain during his hospitalization so he was taken for a redo CABG on 6/8 (SVG to diag and SVG to OM, IABP placement and CVL placement).  Post operatively he had excessive bleeding from all chest drains (pleural and mediastinal) so he was taken back to the OR on 6/9 for re-exploration.  PCCM was consulted for ventilator management.     STUDIES:  6/14 TTE >EF 30-35%  CULTURES: Sputum 6/10 >> neg  ANTIBIOTICS: 6/8 Vanc >>6/16; 6/18 >> 6/26 ? Renal dose 6/10 Zosyn >>6/16 Primaxin 6/16>>> Flagyl 6/27>> Diflucan 1 dose 6/27>      LINES/TUBES: Left forearm fistula 6/8 Multiple chest tubes (mediastinal and pleural bilateral) >> 6/8 CVC left femoral vein >>out 6/8 R radial arterial line >>6/17>>>6/17>>> 6/8 L IJ Swan Ganz >>6/17 L [place] TLC 6/16>>> R [place] HD catheter 6/23 >>  Trach 6/20 >> changed 6/27>>  SIGNIFICANT EVENTS: 6/8 Redo CABG 6/8 take back to OR emergently for diffuse bleeding 6/9 - Rx CRRT 6/10 - start paralysis continuous 6/14 - dc paralysis 6/16 back to OR to close sternum 6/17 - dc IABP 6/20 - s/p trach  YACOUB 07/28/15 - post pyloric tube 07/31/15  - rt subclavian trialysis catgh 6/25:  > norepi up to 40, epi down to 6. CVVHD running. He tolerates PSV, is tachypneic no matter what vent mode mode we use. Note rare fungus on resp cx 6/26: on trach. On CRRT;keeping even On pressors - epi 6. Levophed 40-50 for sbp > 100. On precedex. Not following commands. Off precerdex wob increases. On PRVC; RN says does well on day time SBT. VERY JAUNDICED 6/27 trach exchange 6/28  -  LOS 29 days. 10% rt ptx and Dr Lucianne Lei Tright placed chest tube.  CRT contnues.  Per RN on levophed, epi, vasipresson and milrinone. Cuff leak with trach +, trach changed 6/27  08/05/15 - LOS 30 days. Multiple pressors - levophed, epi and vasopression and milrinone. GI has dx with c diff and cirrhosis with MELD 40 . They are advising xifaxan. CRRT continues and daughte April at bedside witout questions   SUBJECTIVE/OVERNIGHT/INTERVAL HX 08/06/15 - now DNR but still full medical care with pressors and CRRT. Daughter not ready to withdraw per CVTS notes. Per RN - No CPR not yet in notes because CVTS plans to talk to patient  VITAL SIGNS: BP 152/47 mmHg  Pulse 79  Temp(Src) 98.4 F (36.9 C) (Axillary)  Resp 23  Ht 5\' 11"  (1.803 m)  Wt 95.8 kg (211 lb 3.2 oz)  BMI 29.47 kg/m2  SpO2 100%  HEMODYNAMICS: CVP:  [11 mmHg-14 mmHg] 11 mmHg  VENTILATOR SETTINGS: Vent Mode:  [-] PRVC FiO2 (%):  [40 %] 40 % Set Rate:  [16 bmp] 16 bmp Vt Set:  [500 mL] 500 mL PEEP:  [5 cmH20] 5 cmH20 Plateau Pressure:  [19 cmH20-33 cmH20] 20 cmH20  INTAKE / OUTPUT: I/O last 3 completed shifts: In: [id number] [I.V.:3716.2; NG/GT:2235; IV Piggyback:500] Out: [id number] [Other:5813; Stool:600; Chest Tube:100]  PHYSICAL EXAMINATION: General:  Critically ill on vent, CVVHD, jaundiced. Icteric.  Neuro:  Sedated, follows commands  HEENT:  Scleral icterus.  TRACH in place Cardiovascular:  median sternotomy dressing dr. Kermit Balo s1/s2. (-) s3/m/r/g Lungs:  Fair ae.On PRVC rr 40 Abdomen:  Distended, some focal superficial bruising, dec BS Musculoskeletal:  No bony abnormalitiies Skin:  Bruising all over extensor surfaces and belly, Gr 1 edema  LABS:  PULMONARY  Recent Labs Lab 08/03/15 1227 08/03/15 2120 08/04/15 0355 08/04/15 0410 08/05/15 0402 08/05/15 0410 08/06/15 0407 08/06/15 0420  PHART 7.423 7.433 7.413  --  7.377  --  7.396  --   PCO2ART 37.0 40.3 39.5  --  43.2  --  40.9  --   PO2ART 94.0 353.0* 76.4*  --   88.0  --  73.0*  --   HCO3 24.4* 27.0* 24.7*  --  25.4*  --  25.1*  --   TCO2 26 28 25.9  --  27  --  26  --   O2SAT 98.0 100.0 95.2 67.5 96.0 65.9 94.0 65.0    CBC  Recent Labs Lab 08/04/15 0400 08/05/15 0410 08/06/15 0400  HGB 8.2* 8.8* 8.6*  HCT 27.0* 28.6* 27.4*  WBC 17.8* 16.1* 15.6*  PLT 127* 114* 108*    COAGULATION  Recent Labs Lab 08/03/15 1815  INR 1.58*    CARDIAC  No results for input(s): TROPONINI in the last 168 hours. No results for input(s): PROBNP in the last 168 hours.   CHEMISTRY  Recent Labs Lab 08/02/15 0520  08/03/15 0435  08/03/15 1630 08/04/15 0400 08/05/15 0410 08/05/15 1620 08/06/15 0400  NA 135  < > 132*  < > 133* 132* 133*  133* 132* 132*  133*  K 4.3  < > 4.1  < > 4.6 4.0 4.0  4.0 4.0 3.8  3.8  CL 101  < > 102  < > 101 100* 100*  100* 101 98*  99*  CO2 25  < > 23  < > 23 23 24  24 23 24  25   GLUCOSE 148*  < > 158*  < > 147* 105* 121*  121* 148* 148*  146*  BUN 48*  < > 48*  < > 46* 43* 44*  44* 42* 43*  43*  CREATININE 1.93*  < > 1.85*  < > 1.85* 1.66* 1.63*  1.66* 1.78* 1.69*  1.60*  CALCIUM 8.2*  < > 8.3*  < > 8.5* 8.4* 8.3*  8.3* 8.5* 8.5*  8.5*  MG 2.6*  --  2.7*  --   --  2.6* 2.5*  --  2.7*  PHOS 4.8*  < >  --   < > 5.2* 4.4 4.8*  4.7* 4.8* 4.3  < > = values in this interval not displayed. Estimated Creatinine Clearance: 52.9 mL/min (by C-G formula based on Cr of 1.6).   LIVER  Recent Labs Lab 08/02/15 0520  08/03/15 0435  08/03/15 1630 08/03/15 1815 08/04/15 0400 08/05/15 0410 08/05/15 1620 08/06/15 0400  AST 287*  --  262*  --   --   --  249* 238*  --  216*  ALT 101*  --  95*  --   --   --  84* 81*  --  74*  ALKPHOS 1066*  --  999*  --   --   --  1227* 1259*  --  1138*  BILITOT 41.9*  --  43.7*  --   --   --  45.2* 44.8*  --  42.4*  PROT 5.3*  --  5.3*  --   --   --  5.5* 5.1*  --  5.3*  ALBUMIN 1.7*  < > 1.8*  < > 1.8*  --  1.8* 1.8*  1.8* 2.0* 2.0*  2.0*  INR  --   --   --   --   --   1.58*  --   --   --   --   < > = values in this interval not displayed.   INFECTIOUS  Recent Labs Lab 08/04/15 0009  LATICACIDVEN 0.8     ENDOCRINE CBG (last 3)   Recent Labs  08/05/15 1925 08/05/15 2354 08/06/15 0353  GLUCAP 159* 170* 141*         IMAGING x48h  - image(s) personally visualized  -   highlighted in bold Dg Chest Port 1 View  08/06/2015  CLINICAL DATA:  Chest tube.  CABG. EXAM: PORTABLE CHEST 1 VIEW COMPARISON:  07/17/2015. FINDINGS: Tracheostomy tube feeding tube bilateral central lines and right chest tube in stable position. Stable small right apical pneumothorax. Surgical staples right chest. Prior CABG. Cardiomegaly with progressive bilateral pulmonary infiltrates consistent pulmonary edema. Small right pleural effusion. IMPRESSION: 1. Lines and tubes including right chest tube in stable position. Stable right apical pneumothorax. 2. Prior CABG. Cardiomegaly with progressive bilateral pulmonary infiltrates consistent with progressive pulmonary edema. Small right pleural effusion. Electronically Signed   By: Marcello Moores  Register   On: 08/06/2015 07:41   Dg Chest Port 1 View  08/05/2015  CLINICAL DATA:  Respiratory failure EXAM: PORTABLE CHEST 1 VIEW COMPARISON:  08/04/2015 FINDINGS: Small right lateral and right apical pneumothorax is slightly larger. Right chest tube remains in place. Tracheostomy in good position. Central venous catheter tip in the proximal SVC. Feeding tube enters the stomach. Pulmonary vascular congestion with diffuse bilateral airspace disease is unchanged. Mild bibasilar atelectasis. No significant pleural effusion. IMPRESSION: Right pneumothorax is slightly larger today. Right chest tube remains in place. Diffuse bilateral airspace disease unchanged.  Probable edema. Electronically Signed   By: Franchot Gallo M.D.   On: 08/05/2015 07:38       DISCUSSION: 70 y/o male s/p redo CABG 6/8 complicated by significant post operative bleeding in  the setting of thrombocytopenia brought back to the Surgical ICU on 6/9 with hypoxemic respiratory failure.  He has baseline ESRD and DM2.  ASSESSMENT / PLAN:  PULMONARY A: Acute hypoxemic respiratory failure 2/2  Pulm edema, possible HCAP S/P Trache 6/20 by JY and cuff change ? 08/04/15 by Dr Titus Mould Post operative pleural bleeding   - on PRVC  40% fio2, peep 5. Curff leak + via trach. New Rt ptx - chest tube by CVTS ? 6/28  P:   Full vent support with PSV as tolerated Chest tubes per thoracic surgery, replaced CT per surgery.  CARDIOVASCULAR A:  Severe Cardiogenic and hemorrhagic shock CAD s/p redo CABG IABP d/c'd on 6/18   on multiple pressors P:  Cont levophed and epi , vasio and milrinone drips, wean as able - goal sbp > 100 Tele monitoring. Amiodarone off  RENAL A:   ESRD CKD V   - on CRRT P:   Continue CVVHD per renal -- tolerating volume removal on pressors Renal following Replace electrolytes as indicated.  GASTROINTESTINAL A:   Jaundice and coagulopathy due to shocked liver. P:   OG tube TPN per pharmacy due to high residuals. H2 blocker for stress ulcer prophylaxis Abdominal U/S negative for obstruction, likely shocked liver.   HEMATOLOGIC  Recent Labs  08/05/15 0410 08/06/15 0400  HGB 8.8* 8.6*  A:   Diffuse oozing due to coagulopathy from blood loss, thrombocytopenia. Improved.  P:  Transfuse per TCTS guidelines. Trend CBC, coags.   INFECTIOUS Results for orders placed or performed during the hospital encounter of 06/08/2015  MRSA PCR Screening     Status: None   Collection Time: 06/24/2015  8:50 PM  Result Value Ref Range Status   MRSA by PCR NEGATIVE NEGATIVE Final    Comment:        The GeneXpert MRSA Assay (FDA approved for NASAL specimens only), is one component of a comprehensive MRSA colonization surveillance program. It is not intended to diagnose MRSA infection nor to guide or monitor treatment for MRSA infections.    Surgical pcr screen     Status: None   Collection Time: 07/14/15  6:22 PM  Result Value Ref Range Status   MRSA, PCR NEGATIVE NEGATIVE Final   Staphylococcus aureus NEGATIVE NEGATIVE Final    Comment:        The Xpert SA Assay (FDA approved for NASAL specimens in patients over 82 years of age), is one component of a comprehensive surveillance program.  Test performance has been validated by [facility] for patients greater than or equal to 34 year old. It is not intended to diagnose infection nor to guide or monitor treatment.   Culture, respiratory (NON-Expectorated)     Status: None   Collection Time: 07/17/15  4:51 AM  Result Value Ref Range Status   Specimen Description TRACHEAL ASPIRATE  Final   Special Requests Normal  Final   Gram Stain   Final    RARE WBC PRESENT, PREDOMINANTLY MONONUCLEAR NO ORGANISMS SEEN    Culture NO GROWTH 2 DAYS  Final   Report Status 07/31/2015 FINAL  Final  Aerobic Culture (superficial specimen)     Status: None   Collection Time: 07/13/2015 10:04 AM  Result Value Ref Range Status   Specimen Description WOUND CHEST  Final   Special Requests STERNAL WOUND POF VANCO AND ZOSYN  Final   Gram Stain   Final    RARE WBC PRESENT,BOTH PMN AND MONONUCLEAR NO ORGANISMS SEEN    Culture NO GROWTH 2 DAYS  Final   Report Status 07/26/2015 FINAL  Final  Culture, respiratory (NON-Expectorated)     Status: None   Collection Time: 07/31/2015  3:36 PM  Result Value Ref Range Status   Specimen Description TRACHEAL ASPIRATE  Final   Special Requests Normal  Final   Gram Stain   Final    FEW WBC PRESENT,BOTH PMN AND MONONUCLEAR NO ORGANISMS SEEN    Culture NO GROWTH 2 DAYS  Final   Report Status 07/26/2015 FINAL  Final  Culture, blood (single) w Reflex to ID Panel     Status: None   Collection Time: 07/26/2015  6:48 PM  Result Value Ref Range Status   Specimen Description BLOOD CENTRAL LINE  Final   Special Requests BOTTLES DRAWN AEROBIC AND ANAEROBIC  10CC  Final   Culture NO GROWTH 5 DAYS  Final   Report Status 07/28/2015 FINAL  Final  Blood culture     Status: None   Collection Time: 07/29/15 12:02 PM  Result Value Ref Range Status   Specimen Description BLOOD RIGHT HAND  Final   Special Requests IN PEDIATRIC BOTTLE 3CC  Final   Culture NO GROWTH 5 DAYS  Final   Report Status 08/03/2015 FINAL  Final  Culture, respiratory (NON-Expectorated)     Status: None   Collection Time: 07/29/15  3:22 PM  Result Value Ref Range Status   Specimen Description TRACHEAL ASPIRATE  Final   Special Requests NONE  Final   Gram Stain   Final    ABUNDANT WBC PRESENT,BOTH PMN AND MONONUCLEAR NO ORGANISMS SEEN    Culture   Final    RARE CANDIDA TROPICALIS CRITICAL RESULT CALLED TO, READ BACK BY AND VERIFIED WITH: N ZAFARIS 07/31/15 @ 22 M VESTAL    Report Status 08/02/2015 FINAL  Final  Culture, respiratory (NON-Expectorated)     Status: None   Collection Time: 08/02/15  9:08 AM  Result Value Ref Range Status   Specimen Description TRACHEAL ASPIRATE  Final   Special Requests Normal  Final   Gram Stain   Final    RARE WBC PRESENT,BOTH PMN AND MONONUCLEAR NO ORGANISMS SEEN    Culture MODERATE CANDIDA TROPICALIS  Final   Report Status 08/05/2015 FINAL  Final  Blood culture     Status: None (Preliminary result)   Collection Time: 08/02/15  2:08 PM  Result Value Ref Range Status   Specimen Description BLOOD RIGHT HAND  Final   Special Requests IN PEDIATRIC BOTTLE 1CC  Final   Culture NO GROWTH 3 DAYS  Final   Report Status PENDING  Incomplete  C difficile quick scan w PCR reflex     Status: Abnormal   Collection Time: 08/02/15 10:20 PM  Result Value Ref Range Status   C Diff antigen POSITIVE (A) NEGATIVE Final   C Diff toxin NEGATIVE NEGATIVE Final   C Diff interpretation   Final    C. difficile present, but toxin not detected. This indicates colonization. In most cases, this does not require treatment. If patient has signs and symptoms  consistent with colitis, consider treatment. Requires ENTERIC precautions.  Clostridium Difficile by PCR     Status: Abnormal   Collection Time: 08/02/15 10:20 PM  Result Value Ref Range Status   Toxigenic C Difficile by pcr POSITIVE (A) NEGATIVE Final    Comment: CRITICAL RESULT CALLED TO, READ BACK BY AND VERIFIED WITH: Retia Passe MD 14:20 08/04/15 ([email or web address])   Culture, respiratory (NON-Expectorated)     Status: None (Preliminary result)   Collection Time: 08/05/15 11:35 AM  Result Value Ref Range Status   Specimen Description TRACHEAL ASPIRATE  Final   Special Requests NONE  Final   Gram Stain   Final    MODERATE WBC PRESENT,BOTH PMN AND MONONUCLEAR FEW SQUAMOUS EPITHELIAL CELLS PRESENT NO ORGANISMS SEEN    Culture PENDING  Incomplete   Report Status PENDING  Incomplete    A:   ?HCAP - doubt, respiratory culture negative Rare fungus in resp cx Also C Dif 0 new  P:   Anti-infectives    Start     Dose/Rate Route Frequency Ordered Stop   08/05/15 1145  rifaximin (XIFAXAN) tablet 550 mg     550 mg Oral 3 times daily 08/05/15 1131 08/12/15 0959   08/04/15 1800  vancomycin (VANCOCIN) 50 mg/mL oral solution 125 mg  Status:  Discontinued     125 mg Oral Every 6 hours 08/04/15 1559 08/04/15 1559   08/04/15 1800  vancomycin (VANCOCIN) 50 mg/mL oral solution 500 mg     500 mg Oral Every 6 hours 08/04/15 1559     08/04/15 1100  fluconazole (DIFLUCAN) IVPB 100 mg     100 mg 50 mL/hr over 60 Minutes Intravenous Every 24 hours 08/04/15 0942     08/03/15 2200  vancomycin (VANCOCIN) IVPB 1000 mg/200 mL  premix     1,000 mg 200 mL/hr over 60 Minutes Intravenous Every 24 hours 08/03/15 2118     08/03/15 1000  metroNIDAZOLE (FLAGYL) tablet 500 mg  Status:  Discontinued     500 mg Oral 3 times daily 08/03/15 0753 08/04/15 1559   08/03/15 0730  fluconazole (DIFLUCAN) IVPB 100 mg     100 mg 50 mL/hr over 60 Minutes Intravenous  Once 08/03/15 0721 08/03/15 0930   08/03/15 0000  vancomycin  (VANCOCIN) IVPB 1000 mg/200 mL premix  Status:  Discontinued     1,000 mg 200 mL/hr over 60 Minutes Intravenous  Once 08/02/15 1012 08/02/15 1843   08/03/15 0000  vancomycin (VANCOCIN) IVPB 1000 mg/200 mL premix     1,000 mg 200 mL/hr over 60 Minutes Intravenous  Once 08/02/15 1843 08/02/15 2118   08/02/15 1845  vancomycin (VANCOCIN) IVPB 1000 mg/200 mL premix  Status:  Discontinued     1,000 mg 200 mL/hr over 60 Minutes Intravenous Every 12 hours 08/02/15 1842 08/02/15 1843   08/02/15 1330  fluconazole (DIFLUCAN) IVPB 100 mg     100 mg 50 mL/hr over 60 Minutes Intravenous  Once 08/02/15 1318 08/02/15 1512   08/01/15 2200  vancomycin (VANCOCIN) IVPB 1000 mg/200 mL premix     1,000 mg 200 mL/hr over 60 Minutes Intravenous  Once 08/01/15 1130 08/01/15 2333   07/31/15 1600  vancomycin (VANCOCIN) IVPB 1000 mg/200 mL premix     1,000 mg 200 mL/hr over 60 Minutes Intravenous  Once 07/31/15 1144 07/31/15 1656   07/30/15 1000  vancomycin (VANCOCIN) IVPB 1000 mg/200 mL premix     1,000 mg 200 mL/hr over 60 Minutes Intravenous  Once 07/30/15 0826 07/30/15 1022   07/26/15 0830  fluconazole (DIFLUCAN) IVPB 100 mg     100 mg 50 mL/hr over 60 Minutes Intravenous Every 24 hours 07/26/15 0726 07/27/15 1031   07/25/15 1830  vancomycin (VANCOCIN) IVPB 1000 mg/200 mL premix  Status:  Discontinued     1,000 mg 200 mL/hr over 60 Minutes Intravenous Every 24 hours 07/25/15 1332 07/29/15 1538   07/24/15 1400  imipenem-cilastatin (PRIMAXIN) 500 mg in sodium chloride 0.9 % 100 mL IVPB  Status:  Discontinued     500 mg 200 mL/hr over 30 Minutes Intravenous Every 8 hours 07/24/15 1139 08/04/15 2037   07/12/2015 1545  fluconazole (DIFLUCAN) tablet 100 mg     100 mg Oral  Once 08/03/2015 1534 07/20/2015 1617   08/04/2015 1300  piperacillin-tazobactam (ZOSYN) IVPB 2.25 g  Status:  Discontinued     2.25 g 100 mL/hr over 30 Minutes Intravenous Every 8 hours 07/27/2015 1224 07/24/15 1142   07/22/2015 0927  vancomycin  (VANCOCIN) 1,000 mg in sodium chloride 0.9 % 1,000 mL irrigation  Status:  Discontinued       As needed 07/12/2015 0928 07/17/2015 1039   07/18/2015 0926  polymyxin B 500,000 Units, bacitracin 50,000 Units in sodium chloride irrigation 0.9 % 500 mL irrigation  Status:  Discontinued       As needed 07/09/2015 0927 08/06/2015 1039   07/14/2015 0745  vancomycin (VANCOCIN) 1,000 mg in sodium chloride 0.9 % 1,000 mL irrigation  Status:  Discontinued      Irrigation To Surgery 07/17/2015 0733 08/03/2015 1047   07/21/15 1115  vancomycin (VANCOCIN) IVPB 1000 mg/200 mL premix  Status:  Discontinued     1,000 mg 200 mL/hr over 60 Minutes Intravenous Every 24 hours 07/21/15 1101 07/16/2015 1224   07/18/15 1045  vancomycin (VANCOCIN)  1,250 mg in sodium chloride 0.9 % 250 mL IVPB  Status:  Discontinued     1,250 mg 166.7 mL/hr over 90 Minutes Intravenous Every 24 hours 07/18/15 1043 07/21/15 1101   07/17/15 0400  piperacillin-tazobactam (ZOSYN) IVPB 3.375 g  Status:  Discontinued     3.375 g 100 mL/hr over 30 Minutes Intravenous Every 6 hours 07/16/15 2134 07/21/2015 1224   07/16/15 2030  piperacillin-tazobactam (ZOSYN) IVPB 3.375 g  Status:  Discontinued     3.375 g 12.5 mL/hr over 240 Minutes Intravenous Every 8 hours 07/16/15 1856 07/16/15 2134   07/16/15 2000  vancomycin (VANCOCIN) IVPB 1000 mg/200 mL premix     1,000 mg 200 mL/hr over 60 Minutes Intravenous  Once 07/16/15 1858 07/16/15 2046   07/22/2015 2315  vancomycin (VANCOCIN) IVPB 1000 mg/200 mL premix  Status:  Discontinued     1,000 mg 200 mL/hr over 60 Minutes Intravenous  Once 07/20/2015 1911 07/16/15 1858   07/28/2015 2100  cefUROXime (ZINACEF) 1.5 g in dextrose 5 % 50 mL IVPB  Status:  Discontinued     1.5 g 100 mL/hr over 30 Minutes Intravenous Every 12 hours 07/17/2015 1911 07/16/15 1856   08/03/2015 1130  cefUROXime (ZINACEF) 750 mg in dextrose 5 % 50 mL IVPB  Status:  Discontinued     750 mg 100 mL/hr over 30 Minutes Intravenous To Surgery 07/31/2015 1122  07/29/2015 1902   07/08/2015 0400  vancomycin (VANCOCIN) 1,250 mg in sodium chloride 0.9 % 250 mL IVPB  Status:  Discontinued     1,250 mg 166.7 mL/hr over 90 Minutes Intravenous To Surgery 07/14/15 1054 07/14/15 1435   07/18/2015 0400  cefUROXime (ZINACEF) 1.5 g in dextrose 5 % 50 mL IVPB  Status:  Discontinued     1.5 g 100 mL/hr over 30 Minutes Intravenous To Surgery 07/14/15 1054 07/14/15 1435   07/12/2015 0400  cefUROXime (ZINACEF) 750 mg in dextrose 5 % 50 mL IVPB  Status:  Discontinued     750 mg 100 mL/hr over 30 Minutes Intravenous To Surgery 07/14/15 1053 07/14/15 1435   07/10/2015 0400  vancomycin (VANCOCIN) 1,500 mg in sodium chloride 0.9 % 250 mL IVPB     1,500 mg 125 mL/hr over 120 Minutes Intravenous To Surgery 07/14/15 1432 07/08/2015 0745   07/22/2015 0400  cefUROXime (ZINACEF) 1.5 g in dextrose 5 % 50 mL IVPB     1.5 g 100 mL/hr over 30 Minutes Intravenous To Surgery 07/14/15 1432 08/04/2015 1654   08/05/2015 0400  cefUROXime (ZINACEF) 750 mg in dextrose 5 % 50 mL IVPB  Status:  Discontinued     750 mg 100 mL/hr over 30 Minutes Intravenous To Surgery 07/14/15 1429 07/24/2015 1902         ENDOCRINE CBG (last 3)   Recent Labs  08/05/15 1925 08/05/15 2354 08/06/15 0353  GLUCAP 159* 170* 141*     A:   DM2   P:   Monitor glucose Insulin gtt per TCTS post op protocol  NEUROLOGIC A:   Sedation needed  for vent synchrony   - intermittently interacts P:   RASS goal: -2 to -3 Work to decrease sedation as able.   FAMILY  - Updates:  No family bedside 6/23 , 08/02/15, 08/03/15, 6/28. Daughter 08/05/15 denies questions - she plans to meet with dr Nils Pyle later. No family 08/06/15 -    GLOBAL Cirrhosis and MELD score 40 - 3 mo mortality > 70% is itself very ery high just on that. In  addition PROVENT score - for 1 year mortality - age > 57, vent dependent > 21 days, platlets < 150, vasopressor need and CRRT  Suggests > 95- 100% 1 year mortality/   Very little contribution from  pulmonary at this point. Consider EOL discussions    The patient is critically ill with multiple organ systems failure and requires high complexity decision making for assessment and support, frequent evaluation and titration of therapies, application of advanced monitoring technologies and extensive interpretation of multiple databases.   Critical Care Time devoted to patient care services described in this note is  30  Minutes. This time reflects time of care of this signee Dr Brand Males. This critical care time does not reflect procedure time, or teaching time or supervisory time of PA/NP/Med student/Med Resident etc but could involve care discussion time    Dr. Brand Males, M.D., [facility].C.P Pulmonary and Critical Care Medicine Staff Physician [facility] Pager: [phone], If no answer or between  15:00h - 7:00h: call [phone]  08/06/2015 8:54 AM

## 2015-08-06 NOTE — Progress Notes (Signed)
      [address]       [place] [address]             [phone]      No new issues today  BP 152/47 mmHg  Pulse 86  Temp(Src) 99.9 F (37.7 C) (Axillary)  Resp 23  Ht 5\' 11"  (1.803 m)  Wt 211 lb 3.2 oz (95.8 kg)  BMI 29.47 kg/m2  SpO2 100%   Intake/Output Summary (Last 24 hours) at 08/06/15 1907 Last data filed at 08/06/15 1900  Gross per 24 hour  Intake   4273 ml  Output   4169 ml  Net    104 ml    On CRRT  Prognosis guarded, limited code  Remo Lipps C. Roxan Hockey, MD [facility] [phone]

## 2015-08-06 NOTE — Progress Notes (Signed)
   08/06/15 1400  Clinical Encounter Type  Visited With Family  Visit Type Follow-up  Referral From Nurse  Consult/Referral To Chaplain  Spiritual Encounters  Spiritual Needs Emotional  Stress Factors  Family Stress Factors Health changes;Lack of knowledge  Chaplain made follow up visit, family at bedside, patient has had some decline, was advised that patient's church [employer] had just visited. No further support needed at this time. Advised family that support services are available 24/7 upon request.

## 2015-08-07 LAB — POCT I-STAT 3, ART BLOOD GAS (G3+)
Acid-Base Excess: 1 mmol/L (ref 0.0–2.0)
BICARBONATE: 26.4 meq/L — AB (ref 20.0–24.0)
Bicarbonate: 25.8 mEq/L — ABNORMAL HIGH (ref 20.0–24.0)
O2 Saturation: 97 %
O2 Saturation: 97 %
PCO2 ART: 47 mmHg — AB (ref 35.0–45.0)
PH ART: 7.39 (ref 7.350–7.450)
PO2 ART: 96 mmHg (ref 80.0–100.0)
Patient temperature: 98
TCO2: 27 mmol/L (ref 0–100)
TCO2: 28 mmol/L (ref 0–100)
pCO2 arterial: 43.4 mmHg (ref 35.0–45.0)
pH, Arterial: 7.347 — ABNORMAL LOW (ref 7.350–7.450)
pO2, Arterial: 86 mmHg (ref 80.0–100.0)

## 2015-08-07 LAB — COMPREHENSIVE METABOLIC PANEL
ALT: 81 U/L — ABNORMAL HIGH (ref 17–63)
AST: 204 U/L — ABNORMAL HIGH (ref 15–41)
Albumin: 1.8 g/dL — ABNORMAL LOW (ref 3.5–5.0)
Alkaline Phosphatase: 1154 U/L — ABNORMAL HIGH (ref 38–126)
Anion gap: 10 (ref 5–15)
BUN: 41 mg/dL — ABNORMAL HIGH (ref 6–20)
CO2: 24 mmol/L (ref 22–32)
Calcium: 8.4 mg/dL — ABNORMAL LOW (ref 8.9–10.3)
Chloride: 99 mmol/L — ABNORMAL LOW (ref 101–111)
Creatinine, Ser: 1.76 mg/dL — ABNORMAL HIGH (ref 0.61–1.24)
GFR calc Af Amer: 44 mL/min — ABNORMAL LOW (ref >60)
GFR calc non Af Amer: 38 mL/min — ABNORMAL LOW (ref >60)
Glucose, Bld: 149 mg/dL — ABNORMAL HIGH (ref 65–99)
Potassium: 3.8 mmol/L (ref 3.5–5.1)
Sodium: 133 mmol/L — ABNORMAL LOW (ref 135–145)
Total Bilirubin: 44.8 mg/dL (ref 0.3–1.2)
Total Protein: 5.4 g/dL — ABNORMAL LOW (ref 6.5–8.1)

## 2015-08-07 LAB — CBC
HCT: 27.7 % — ABNORMAL LOW (ref 39.0–52.0)
Hemoglobin: 8.5 g/dL — ABNORMAL LOW (ref 13.0–17.0)
MCH: 29.8 pg (ref 26.0–34.0)
MCHC: 30.7 g/dL (ref 30.0–36.0)
MCV: 97.2 fL (ref 78.0–100.0)
Platelets: 106 10*3/uL — ABNORMAL LOW (ref 150–400)
RBC: 2.85 MIL/uL — ABNORMAL LOW (ref 4.22–5.81)
RDW: 24.3 % — ABNORMAL HIGH (ref 11.5–15.5)
WBC: 15.4 10*3/uL — ABNORMAL HIGH (ref 4.0–10.5)

## 2015-08-07 LAB — GLUCOSE, CAPILLARY
GLUCOSE-CAPILLARY: 131 mg/dL — AB (ref 65–99)
GLUCOSE-CAPILLARY: 126 mg/dL — AB (ref 65–99)
Glucose-Capillary: 121 mg/dL — ABNORMAL HIGH (ref 65–99)
Glucose-Capillary: 133 mg/dL — ABNORMAL HIGH (ref 65–99)
Glucose-Capillary: 140 mg/dL — ABNORMAL HIGH (ref 65–99)
Glucose-Capillary: 188 mg/dL — ABNORMAL HIGH (ref 65–99)

## 2015-08-07 LAB — RENAL FUNCTION PANEL
Albumin: 1.9 g/dL — ABNORMAL LOW (ref 3.5–5.0)
Anion gap: 8 (ref 5–15)
BUN: 46 mg/dL — AB (ref 6–20)
CHLORIDE: 101 mmol/L (ref 101–111)
CO2: 24 mmol/L (ref 22–32)
Calcium: 8.3 mg/dL — ABNORMAL LOW (ref 8.9–10.3)
Creatinine, Ser: 1.74 mg/dL — ABNORMAL HIGH (ref 0.61–1.24)
GFR calc Af Amer: 45 mL/min — ABNORMAL LOW (ref >60)
GFR calc non Af Amer: 39 mL/min — ABNORMAL LOW (ref >60)
GLUCOSE: 145 mg/dL — AB (ref 65–99)
POTASSIUM: 3.8 mmol/L (ref 3.5–5.1)
Phosphorus: 4.9 mg/dL — ABNORMAL HIGH (ref 2.5–4.6)
Sodium: 133 mmol/L — ABNORMAL LOW (ref 135–145)

## 2015-08-07 LAB — CARBOXYHEMOGLOBIN
Carboxyhemoglobin: 2.5 % — ABNORMAL HIGH (ref 0.5–1.5)
Methemoglobin: 0.4 % (ref 0.0–1.5)
O2 Saturation: 65.9 %
Total hemoglobin: 8.4 g/dL — ABNORMAL LOW (ref 13.5–18.0)

## 2015-08-07 LAB — CULTURE, BLOOD (SINGLE): Culture: NO GROWTH

## 2015-08-07 LAB — CALCIUM, IONIZED
Calcium, Ionized, Serum: 4.9 mg/dL (ref 4.5–5.6)
Calcium, Ionized, Serum: 4.7 mg/dL (ref 4.5–5.6)

## 2015-08-07 LAB — MAGNESIUM: MAGNESIUM: 2.6 mg/dL — AB (ref 1.7–2.4)

## 2015-08-07 MED ORDER — PANCRELIPASE (LIP-PROT-AMYL) 12000-38000 UNITS PO CPEP
2.0000 | ORAL_CAPSULE | Freq: Once | ORAL | Status: AC
  Administered 2015-08-08: 24000 [IU] via ORAL
  Filled 2015-08-07: qty 2

## 2015-08-07 MED ORDER — SODIUM BICARBONATE 650 MG PO TABS
650.0000 mg | ORAL_TABLET | Freq: Once | ORAL | Status: AC
  Administered 2015-08-08: 650 mg via ORAL
  Filled 2015-08-07: qty 1

## 2015-08-07 NOTE — Progress Notes (Signed)
15 Days Post-Op Procedure(s) (LRB): STERNAL CLOSURE WITH PUMP STANDBY (N/A) TRANSESOPHAGEAL ECHOCARDIOGRAM (TEE) (N/A) Subjective: Opens eyes, responds and follows commands  Objective: Vital signs in last 24 hours: Temp:  [98 F (36.7 C)-99.9 F (37.7 C)] 98.1 F (36.7 C) (07/01 0345) Pulse Rate:  [76-88] 81 (07/01 0845) Cardiac Rhythm:  [-] Normal sinus rhythm (07/01 0800) Resp:  [14-41] 20 (07/01 0845) SpO2:  [100 %] 100 % (07/01 0845) Arterial Line BP: (81-182)/(25-69) 111/35 mmHg (07/01 0845) FiO2 (%):  [40 %-50 %] 50 % (07/01 0500) Weight:  [208 lb 1.8 oz (94.4 kg)] 208 lb 1.8 oz (94.4 kg) (07/01 0145)  Hemodynamic parameters for last 24 hours: CVP:  [16 mmHg-17 mmHg] 17 mmHg  Intake/Output from previous day: 06/30 0701 - 07/01 0700 In: 4090.1 [I.V.:2480.1; NG/GT:1560; IV Piggyback:50] Out: 3689 [Stool:475] Intake/Output this shift: Total I/O In: 174.5 [I.V.:104.5; NG/GT:70] Out: 150 [Other:150]  General appearance: jaundiced Neurologic: follows commands Heart: regular rate and rhythm Lungs: clear anteriorly  Lab Results:  Recent Labs  08/06/15 0400 08/07/15 0346  WBC 15.6* 15.4*  HGB 8.6* 8.5*  HCT 27.4* 27.7*  PLT 108* 106*   BMET:  Recent Labs  08/07/15 0346 08/07/15 0347  NA 133* 133*  K 3.8 3.8  CL 99* 101  CO2 24 24  GLUCOSE 149* 145*  BUN 41* 46*  CREATININE 1.76* 1.74*  CALCIUM 8.4* 8.3*    PT/INR: No results for input(s): LABPROT, INR in the last 72 hours. ABG    Component Value Date/Time   PHART 7.347* 08/07/2015 0342   HCO3 25.8* 08/07/2015 0342   TCO2 27 08/07/2015 0342   ACIDBASEDEF 3.0* 08/06/2015 2231   O2SAT 65.9 08/07/2015 0400   CBG (last 3)   Recent Labs  08/06/15 2357 08/07/15 0344 08/07/15 0804  GLUCAP 188* 140* 131*    Assessment/Plan: S/P Procedure(s) (LRB): STERNAL CLOSURE WITH PUMP STANDBY (N/A) TRANSESOPHAGEAL ECHOCARDIOGRAM (TEE) (N/A) -  Remains critically ill with multiple organ system  dysfunction- liver, renal and respiratory failure Limited code but continuing with medical care On multiple pressors Prognosis is poor   LOS: 32 days    [name] 08/07/2015

## 2015-08-07 NOTE — Progress Notes (Addendum)
PULMONARY / CRITICAL CARE MEDICINE   Name: Joseph [name] MRN: [id number] DOB: January 13, 1946    ADMISSION DATE:  07/02/2015 CONSULTATION DATE:  07/16/2015 LOS 62 days  REFERRING MD:  Nils Pyle  CHIEF COMPLAINT:  Post cardiothoracic surgery ventilator management  subj   70 y/o male with ESRD and significant CAD (CABG 2001, DES 2016, EF 40-45%) was admitted on 5/29 with NSTEMI and had persistent chest pain during his hospitalization so he was taken for a redo CABG on 6/8 (SVG to diag and SVG to OM, IABP placement and CVL placement).  Post operatively he had excessive bleeding from all chest drains (pleural and mediastinal) so he was taken back to the OR on 6/9 for re-exploration.  PCCM was consulted for ventilator management.     STUDIES:  6/14 TTE >EF 30-35%  CULTURES: Sputum 6/10 >> neg  ANTIBIOTICS:  6/8 Vanc IV  >>  6/9 Zosyn>>6/17 6/17 Primaxin >>6/28 ............... 6/26 Fluconazole>6/27 (by MD, got 2 doses) ................. 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 ( 7-10 days)      LINES/TUBES: Left forearm fistula 6/8 Multiple chest tubes (mediastinal and pleural bilateral) >> 6/8 CVC left femoral vein >>out 6/8 R radial arterial line >>6/17>>>6/17>>> 6/8 L IJ [place] >>6/17 L [place] TLC 6/16>>> R [place] HD catheter 6/23 >>  Trach 6/20 >> changed 6/27>>  SIGNIFICANT EVENTS: 6/8 Redo CABG 6/8 take back to OR emergently for diffuse bleeding 6/9 - Rx CRRT 6/10 - start paralysis continuous 6/14 - dc paralysis 6/16 back to OR to close sternum 6/17 - dc IABP 6/20 - s/p trach  YACOUB 07/28/15 - post pyloric tube 07/31/15  - rt subclavian trialysis catgh 6/25:  > norepi up to 40, epi down to 6. CVVHD running. He tolerates PSV, is tachypneic no matter what vent mode mode we use. Note rare fungus on resp cx 6/26: on trach. On CRRT;keeping even On pressors - epi 6. Levophed 40-50 for sbp > 100. On precedex.  Not following commands. Off precerdex wob increases. On PRVC; RN says does well on day time SBT. VERY JAUNDICED 6/27 trach exchange 6/28  - LOS 29 days. 10% rt ptx and Dr Lucianne Lei Tright placed chest tube.  CRT contnues.  Per RN on levophed, epi, vasipresson and milrinone. Cuff leak with trach +, trach changed 6/27  08/05/15 - LOS 30 days. Multiple pressors - levophed, epi and vasopression and milrinone. GI has dx with c diff and cirrhosis with MELD 40 . They are advising xifaxan. CRRT continues and daughte April at bedside witout questions  08/06/15 - now DNR but still full medical care with pressors and CRRT. Daughter not ready to withdraw per CVTS notes. Per RN - No CPR not yet in notes because CVTS plans to talk to patient   SUBJECTIVE/OVERNIGHT/INTERVAL HX 08/07/15 - cofnirmed DNR but full medical care. No change. 4 pressor dependent. On fent gtt - can converse. On cdiff Rx and IV vanc  VITAL SIGNS: BP 152/47 mmHg  Pulse 81  Temp(Src) 98.1 F (36.7 C) (Axillary)  Resp 30  Ht '5\' 11"'$  (1.803 m)  Wt 94.4 kg (208 lb 1.8 oz)  BMI 29.04 kg/m2  SpO2 100%  HEMODYNAMICS: CVP:  [16 mmHg-17 mmHg] 17 mmHg  VENTILATOR SETTINGS: Vent Mode:  [-] PRVC FiO2 (%):  [40 %-50 %] 50 % Set Rate:  [16 bmp] 16 bmp Vt Set:  [500 mL] 500 mL PEEP:  [  [place] Pressure:  [8 [id number] cmH20] 8 cmH20  INTAKE / OUTPUT: I/O last 3 completed shifts: In: 6297.3 [I.V.:3667.3; NG/GT:2380; IV Piggyback:250] Out: 0277 [Other:5040; Stool:775; Chest Tube:40]  PHYSICAL EXAMINATION: General:  Critically ill on vent, CVVHD, jaundiced. Icteric.  Neuro:  Sedated, follows commands HEENT:  Scleral icterus.  TRACH in place Cardiovascular:  median sternotomy dressing dr. Kermit Balo s1/s2. (-) s3/m/r/g Lungs:  Fair ae.On PRVC rr 40 Abdomen:  Distended, some focal superficial bruising, dec BS Musculoskeletal:  No bony abnormalitiies Skin:  Bruising all over extensor surfaces and belly, Gr 1  edema  LABS:  PULMONARY  Recent Labs Lab 08/04/15 0355  08/05/15 0402  08/06/15 0407 08/06/15 0420 08/06/15 2231 08/07/15 0342 08/07/15 0400  PHART 7.413  --  7.377  --  7.396  --  7.327* 7.347*  --   PCO2ART 39.5  --  43.2  --  40.9  --  43.9 47.0*  --   PO2ART 76.4*  --  88.0  --  73.0*  --  73.0* 96.0  --   HCO3 24.7*  --  25.4*  --  25.1*  --  23.0 25.8*  --   TCO2 25.9  --  27  --  26  --  24 27  --   O2SAT 95.2  < > 96.0  < > 94.0 65.0 93.0 97.0 65.9  < > = values in this interval not displayed.  CBC  Recent Labs Lab 08/05/15 0410 08/06/15 0400 08/07/15 0346  HGB 8.8* 8.6* 8.5*  HCT 28.6* 27.4* 27.7*  WBC 16.1* 15.6* 15.4*  PLT 114* 108* 106*    COAGULATION  Recent Labs Lab 08/03/15 1815  INR 1.58*    CARDIAC  No results for input(s): TROPONINI in the last 168 hours. No results for input(s): PROBNP in the last 168 hours.   CHEMISTRY  Recent Labs Lab 08/03/15 0435  08/04/15 0400 08/05/15 0410 08/05/15 1620 08/06/15 0400 08/06/15 1528 08/07/15 0346 08/07/15 0347  NA 132*  < > 132* 133*  133* 132* 132*  133* 131* 133* 133*  K 4.1  < > 4.0 4.0  4.0 4.0 3.8  3.8 3.8 3.8 3.8  CL 102  < > 100* 100*  100* 101 98*  99* 99* 99* 101  CO2 23  < > '23 24  24 23 24  25 22 24 24  '$ GLUCOSE 158*  < > 105* 121*  121* 148* 148*  146* 170* 149* 145*  BUN 48*  < > 43* 44*  44* 42* 43*  43* 46* 41* 46*  CREATININE 1.85*  < > 1.66* 1.63*  1.66* 1.78* 1.69*  1.60* 1.93* 1.76* 1.74*  CALCIUM 8.3*  < > 8.4* 8.3*  8.3* 8.5* 8.5*  8.5* 8.5* 8.4* 8.3*  MG 2.7*  --  2.6* 2.5*  --  2.7*  --  2.6*  --   PHOS  --   < > 4.4 4.8*  4.7* 4.8* 4.3 5.1*  --  4.9*  < > = values in this interval not displayed. Estimated Creatinine Clearance: 48.3 mL/min (by C-G formula based on Cr of 1.74).   LIVER  Recent Labs Lab 08/03/15 0435  08/03/15 1815 08/04/15 0400 08/05/15 0410 08/05/15 1620 08/06/15 0400 08/06/15 1528 08/07/15 0346 08/07/15 0347  AST 262*   --   --  249* 238*  --  216*  --  204*  --   ALT 95*  --   --  84* 81*  --  74*  --  81*  --   ALKPHOS 999*  --   --  1227* 1259*  --  1138*  --  1154*  --   BILITOT 43.7*  --   --  45.2* 44.8*  --  42.4*  --  44.8*  --   PROT 5.3*  --   --  5.5* 5.1*  --  5.3*  --  5.4*  --   ALBUMIN 1.8*  < >  --  1.8* 1.8*  1.8* 2.0* 2.0*  2.0* 2.0* 1.8* 1.9*  INR  --   --  1.58*  --   --   --   --   --   --   --   < > = values in this interval not displayed.   INFECTIOUS  Recent Labs Lab 08/04/15 0009  LATICACIDVEN 0.8     ENDOCRINE CBG (last 3)   Recent Labs  08/06/15 2357 08/07/15 0344 08/07/15 0804  GLUCAP 188* 140* 131*         IMAGING x48h  - image(s) personally visualized  -   highlighted in bold Dg Chest Port 1 View  08/06/2015  CLINICAL DATA:  Chest tube.  CABG. EXAM: PORTABLE CHEST 1 VIEW COMPARISON:  07/17/2015. FINDINGS: Tracheostomy tube feeding tube bilateral central lines and right chest tube in stable position. Stable small right apical pneumothorax. Surgical staples right chest. Prior CABG. Cardiomegaly with progressive bilateral pulmonary infiltrates consistent pulmonary edema. Small right pleural effusion. IMPRESSION: 1. Lines and tubes including right chest tube in stable position. Stable right apical pneumothorax. 2. Prior CABG. Cardiomegaly with progressive bilateral pulmonary infiltrates consistent with progressive pulmonary edema. Small right pleural effusion. Electronically Signed   By: Marcello Moores  Register   On: 08/06/2015 07:41       DISCUSSION: 70 y/o male s/p redo CABG 6/8 complicated by significant post operative bleeding in the setting of thrombocytopenia brought back to the Surgical ICU on 6/9 with hypoxemic respiratory failure.  He has baseline ESRD and DM2.  ASSESSMENT / PLAN:  PULMONARY A: Acute hypoxemic respiratory failure 2/2  Pulm edema, possible HCAP S/P Trache 6/20 by JY and cuff change ? 08/04/15 by Dr Titus Mould Post operative pleural  bleeding   - on PRVC  40% fio2, peep 5. Curff leak + via trach. New Rt ptx - chest tube by CVTS ? 6/28 - unchanged 08/07/15  P:   Full vent support with PSV as tolerated Chest tubes per thoracic surgery, replaced CT per surgery.  CARDIOVASCULAR A:  Severe Cardiogenic and hemorrhagic shock CAD s/p redo CABG IABP d/c'd on 6/18   on multiple pressors P:  Cont levophed and epi , vasio and milrinone drips, wean as able - goal sbp > 100 Tele monitoring. Amiodarone off  RENAL A:   ESRD CKD V   - on CRRT P:   Continue CVVHD per renal -- tolerating volume removal on pressors Renal following Replace electrolytes as indicated.  GASTROINTESTINAL A:   Jaundice and coagulopathy due to shocked liver. P:   OG tube TPN per pharmacy due to high residuals. H2 blocker for stress ulcer prophylaxis Abdominal U/S negative for obstruction, likely shocked liver.   HEMATOLOGIC  Recent Labs  08/06/15 0400 08/07/15 0346  HGB 8.6* 8.5*    A:   Diffuse oozing due to coagulopathy from blood loss, thrombocytopenia. Improved.  P:  Transfuse per TCTS guidelines. Trend CBC, coags.   INFECTIOUS   A:   ?HCAP - doubt, respiratory culture negative Rare fungus  in resp cx Also C Dif -  new 6/27 P:   Anti-infectives    Start     Dose/Rate Route Frequency Ordered Stop   08/07/15 2100  vancomycin (VANCOCIN) 1,250 mg in sodium chloride 0.9 % 250 mL IVPB     1,250 mg 166.7 mL/hr over 90 Minutes Intravenous Every 48 hours 08/06/15 2240     08/05/15 1145  rifaximin (XIFAXAN) tablet 550 mg     550 mg Oral 3 times daily 08/05/15 1131 08/12/15 0959   08/04/15 1800  vancomycin (VANCOCIN) 50 mg/mL oral solution 125 mg  Status:  Discontinued     125 mg Oral Every 6 hours 08/04/15 1559 08/04/15 1559   08/04/15 1800  vancomycin (VANCOCIN) 50 mg/mL oral solution 500 mg     500 mg Oral Every 6 hours 08/04/15 1559     08/04/15 1100  fluconazole (DIFLUCAN) IVPB 100 mg     100 mg 50 mL/hr over 60  Minutes Intravenous Every 24 hours 08/04/15 0942     08/03/15 2200  vancomycin (VANCOCIN) IVPB 1000 mg/200 mL premix  Status:  Discontinued     1,000 mg 200 mL/hr over 60 Minutes Intravenous Every 24 hours 08/03/15 2118 08/06/15 2231   08/03/15 1000  metroNIDAZOLE (FLAGYL) tablet 500 mg  Status:  Discontinued     500 mg Oral 3 times daily 08/03/15 0753 08/04/15 1559   08/03/15 0730  fluconazole (DIFLUCAN) IVPB 100 mg     100 mg 50 mL/hr over 60 Minutes Intravenous  Once 08/03/15 0721 08/03/15 0930   08/03/15 0000  vancomycin (VANCOCIN) IVPB 1000 mg/200 mL premix  Status:  Discontinued     1,000 mg 200 mL/hr over 60 Minutes Intravenous  Once 08/02/15 1012 08/02/15 1843   08/03/15 0000  vancomycin (VANCOCIN) IVPB 1000 mg/200 mL premix     1,000 mg 200 mL/hr over 60 Minutes Intravenous  Once 08/02/15 1843 08/02/15 2118   08/02/15 1845  vancomycin (VANCOCIN) IVPB 1000 mg/200 mL premix  Status:  Discontinued     1,000 mg 200 mL/hr over 60 Minutes Intravenous Every 12 hours 08/02/15 1842 08/02/15 1843   08/02/15 1330  fluconazole (DIFLUCAN) IVPB 100 mg     100 mg 50 mL/hr over 60 Minutes Intravenous  Once 08/02/15 1318 08/02/15 1512   08/01/15 2200  vancomycin (VANCOCIN) IVPB 1000 mg/200 mL premix     1,000 mg 200 mL/hr over 60 Minutes Intravenous  Once 08/01/15 1130 08/01/15 2333   07/31/15 1600  vancomycin (VANCOCIN) IVPB 1000 mg/200 mL premix     1,000 mg 200 mL/hr over 60 Minutes Intravenous  Once 07/31/15 1144 07/31/15 1656   07/30/15 1000  vancomycin (VANCOCIN) IVPB 1000 mg/200 mL premix     1,000 mg 200 mL/hr over 60 Minutes Intravenous  Once 07/30/15 0826 07/30/15 1022   07/26/15 0830  fluconazole (DIFLUCAN) IVPB 100 mg     100 mg 50 mL/hr over 60 Minutes Intravenous Every 24 hours 07/26/15 0726 07/27/15 1031   07/25/15 1830  vancomycin (VANCOCIN) IVPB 1000 mg/200 mL premix  Status:  Discontinued     1,000 mg 200 mL/hr over 60 Minutes Intravenous Every 24 hours 07/25/15 1332  07/29/15 1538   07/24/15 1400  imipenem-cilastatin (PRIMAXIN) 500 mg in sodium chloride 0.9 % 100 mL IVPB  Status:  Discontinued     500 mg 200 mL/hr over 30 Minutes Intravenous Every 8 hours 07/24/15 1139 08/04/15 2037   08/02/2015 1545  fluconazole (DIFLUCAN) tablet 100 mg  100 mg Oral  Once 07/28/2015 1534 07/11/2015 1617   08/01/2015 1300  piperacillin-tazobactam (ZOSYN) IVPB 2.25 g  Status:  Discontinued     2.25 g 100 mL/hr over 30 Minutes Intravenous Every 8 hours 07/21/2015 1224 07/24/15 1142   07/28/2015 0927  vancomycin (VANCOCIN) 1,000 mg in sodium chloride 0.9 % 1,000 mL irrigation  Status:  Discontinued       As needed 07/15/2015 0928 07/29/2015 1039   07/28/2015 0926  polymyxin B 500,000 Units, bacitracin 50,000 Units in sodium chloride irrigation 0.9 % 500 mL irrigation  Status:  Discontinued       As needed 07/16/2015 0927 08/03/2015 1039   08/01/2015 0745  vancomycin (VANCOCIN) 1,000 mg in sodium chloride 0.9 % 1,000 mL irrigation  Status:  Discontinued      Irrigation To Surgery 07/14/2015 0733 07/10/2015 1047   07/21/15 1115  vancomycin (VANCOCIN) IVPB 1000 mg/200 mL premix  Status:  Discontinued     1,000 mg 200 mL/hr over 60 Minutes Intravenous Every 24 hours 07/21/15 1101 07/28/2015 1224   07/18/15 1045  vancomycin (VANCOCIN) 1,250 mg in sodium chloride 0.9 % 250 mL IVPB  Status:  Discontinued     1,250 mg 166.7 mL/hr over 90 Minutes Intravenous Every 24 hours 07/18/15 1043 07/21/15 1101   07/17/15 0400  piperacillin-tazobactam (ZOSYN) IVPB 3.375 g  Status:  Discontinued     3.375 g 100 mL/hr over 30 Minutes Intravenous Every 6 hours 07/16/15 2134 07/15/2015 1224   07/16/15 2030  piperacillin-tazobactam (ZOSYN) IVPB 3.375 g  Status:  Discontinued     3.375 g 12.5 mL/hr over 240 Minutes Intravenous Every 8 hours 07/16/15 1856 07/16/15 2134   07/16/15 2000  vancomycin (VANCOCIN) IVPB 1000 mg/200 mL premix     1,000 mg 200 mL/hr over 60 Minutes Intravenous  Once 07/16/15 1858 07/16/15 2046    07/20/2015 2315  vancomycin (VANCOCIN) IVPB 1000 mg/200 mL premix  Status:  Discontinued     1,000 mg 200 mL/hr over 60 Minutes Intravenous  Once 07/16/2015 1911 07/16/15 1858   07/28/2015 2100  cefUROXime (ZINACEF) 1.5 g in dextrose 5 % 50 mL IVPB  Status:  Discontinued     1.5 g 100 mL/hr over 30 Minutes Intravenous Every 12 hours 07/12/2015 1911 07/16/15 1856   07/27/2015 1130  cefUROXime (ZINACEF) 750 mg in dextrose 5 % 50 mL IVPB  Status:  Discontinued     750 mg 100 mL/hr over 30 Minutes Intravenous To Surgery 07/22/2015 1122 07/19/2015 1902   07/17/2015 0400  vancomycin (VANCOCIN) 1,250 mg in sodium chloride 0.9 % 250 mL IVPB  Status:  Discontinued     1,250 mg 166.7 mL/hr over 90 Minutes Intravenous To Surgery 07/14/15 1054 07/14/15 1435   07/14/2015 0400  cefUROXime (ZINACEF) 1.5 g in dextrose 5 % 50 mL IVPB  Status:  Discontinued     1.5 g 100 mL/hr over 30 Minutes Intravenous To Surgery 07/14/15 1054 07/14/15 1435   07/14/2015 0400  cefUROXime (ZINACEF) 750 mg in dextrose 5 % 50 mL IVPB  Status:  Discontinued     750 mg 100 mL/hr over 30 Minutes Intravenous To Surgery 07/14/15 1053 07/14/15 1435   08/01/2015 0400  vancomycin (VANCOCIN) 1,500 mg in sodium chloride 0.9 % 250 mL IVPB     1,500 mg 125 mL/hr over 120 Minutes Intravenous To Surgery 07/14/15 1432 08/06/2015 0745   07/11/2015 0400  cefUROXime (ZINACEF) 1.5 g in dextrose 5 % 50 mL IVPB     1.5 g  100 mL/hr over 30 Minutes Intravenous To Surgery 07/14/15 1432 07/27/2015 1654   07/12/2015 0400  cefUROXime (ZINACEF) 750 mg in dextrose 5 % 50 mL IVPB  Status:  Discontinued     750 mg 100 mL/hr over 30 Minutes Intravenous To Surgery 07/14/15 1429 07/28/2015 1902         ENDOCRINE CBG (last 3)   Recent Labs  08/06/15 2357 08/07/15 0344 08/07/15 0804  GLUCAP 188* 140* 131*     A:   DM2   P:   Monitor glucose Insulin gtt per TCTS post op protocol  NEUROLOGIC A:   Sedation needed  for vent synchrony   - intermittently interacts P:    RASS goal: -2 to -3 Work to decrease sedation as able.   FAMILY  - Updates:  No family bedside 6/23 , 08/02/15, 08/03/15, 6/28. Daughter 08/05/15 denies questions - she plans to meet with dr Nils Pyle later. No family 08/06/15 for pccm rounds but met with CVTS and DNR confirmed but full medical care -    GLOBAL Cirrhosis and MELD score 40 - 3 mo mortality > 70% is itself very ery high just on that. In addition PROVENT score - for 1 year mortality - age > 97, vent dependent > 21 days, platlets < 150, vasopressor need and CRRT  Suggests > 95- 100% 1 year mortality/   The patient is critically ill with multiple organ systems failure and requires high complexity decision making for assessment and support, frequent evaluation and titration of therapies, application of advanced monitoring technologies and extensive interpretation of multiple databases.   Critical Care Time devoted to patient care services described in this note is  30  Minutes. This time reflects time of care of this signee Dr Brand Males. This critical care time does not reflect procedure time, or teaching time or supervisory time of PA/NP/Med student/Med Resident etc but could involve care discussion time    Dr. Brand Males, M.D., [facility].C.P Pulmonary and Critical Care Medicine Staff Physician [facility] Pager: [phone], If no answer or between  15:00h - 7:00h: call [phone]  08/07/2015 8:31 AM

## 2015-08-07 NOTE — Progress Notes (Signed)
[facility] Progress Note    Assessment/ Plan:   70 y.o. male with ESRD who was admitted 07/01/2015 with CP and subsequent redo CABG complicated by blood loss req transfusion and hemodynamic instability -s/p balloon pump, VDRF, shock, CRRT  1. CAD- complicated redo CABG- continues to be on pressors; high Levophed doses (currently at 82mg). 2. ESRD -TTS [facility]-on CRRT now since 6/17. Has left forearm AVF -Increased fluids pre filter fluid to prevent clotting. S/p 500cc bolus on 6/25. Continue to keep even with CRRT. - I doubt he would tolerate net UF especially with the 3rd spacing and pressor requirements --> continues to be on high Levophed doses (currently at 372m); therefore, continue to keep even. - bladder scan 6/27 --> [id number] is not unexpected. - filter changed on 6/30  Seen on CRRT @ 725 am [id number] Pre/post 350/300 ml/hr Qd 1500ml75mNet UF even No changes; will not tolerate increase in UF.  3. Anemia- has required transfusions- fairly stable h/h today. 4. HTN/volume- CRRT with volume removal (keep net even); fortunately able to at least keep even over past 24hrs. 5. Inc LFTs/hyperbilirubinemia  6. Alkalosis, improved off citrate, problematic usage with liver disease  Subjective:   Continues to be on the vent with high pressor requirements. Levophed increased to 40 mcg Also on Epinephrine and Vasopressin CVP mildly elevated.  25% right PTX 6/27 --> CTwith rush of air return    Objective:   BP 152/47 mmHg  Pulse 81  Temp(Src) 98.1 F (36.7 C) (Axillary)  Resp 30  Ht '5\' 11"'$  (1.803 m)  Wt 94.4 kg (208 lb 1.8 oz)  BMI 29.04 kg/m2  SpO2 100%  Intake/Output Summary (Last 24 hours) at 08/07/15 0822 [id number] data filed at 08/07/15 0700  Gross per 24 hour  Intake 3919.64 ml  Output   3518 ml  Net 401.64 ml   Weight change: -1.4 kg (-3 lb 1.4 oz)  Physical Exam: General appearance: On vent, spontaneous eye opening but no  purposeful movement or response to commands. Extremities: trace to 1+ in the lower extremities. Jaundiced  RRR No BS but soft abdomen Rales posteriorly; decreased BS right posterior. Rt Chest tube  Imaging: Dg Chest Port 1 View  08/06/2015  CLINICAL DATA:  Chest tube.  CABG. EXAM: PORTABLE CHEST 1 VIEW COMPARISON:  07/17/2015. FINDINGS: Tracheostomy tube feeding tube bilateral central lines and right chest tube in stable position. Stable small right apical pneumothorax. Surgical staples right chest. Prior CABG. Cardiomegaly with progressive bilateral pulmonary infiltrates consistent pulmonary edema. Small right pleural effusion. IMPRESSION: 1. Lines and tubes including right chest tube in stable position. Stable right apical pneumothorax. 2. Prior CABG. Cardiomegaly with progressive bilateral pulmonary infiltrates consistent with progressive pulmonary edema. Small right pleural effusion. Electronically Signed   By: ThomaMarcello Mooresister   On: 08/06/2015 07:41    Labs: BMET  Recent Labs Lab 08/03/15 1630 08/04/15 0400 08/05/15 0410 08/05/15 1620 08/06/15 0400 08/06/15 1528 08/07/15 0346 08/07/15 0347  NA 133* 132* 133*  133* 132* 132*  133* 131* 133* 133*  K 4.6 4.0 4.0  4.0 4.0 3.8  3.8 3.8 3.8 3.8  CL 101 100* 100*  100* 101 98*  99* 99* 99* 101  CO2 '23 23 24  24 23 24  25 22 24 24  '$ GLUCOSE 147* 105* 121*  121* 148* 148*  146* 170* 149* 145*  BUN 46* 43* 44*  44* 42* 43*  43* 46* 41* 46*  CREATININE 1.85* 1.66* 1.63*  1.66* 1.78* 1.69*  1.60* 1.93* 1.76* 1.74*  CALCIUM 8.5* 8.4* 8.3*  8.3* 8.5* 8.5*  8.5* 8.5* 8.4* 8.3*  PHOS 5.2* 4.4 4.8*  4.7* 4.8* 4.3 5.1*  --  4.9*   CBC  Recent Labs Lab 08/04/15 0400 08/05/15 0410 08/06/15 0400 08/07/15 0346  WBC 17.8* 16.1* 15.6* 15.4*  HGB 8.2* 8.8* 8.6* 8.5*  HCT 27.0* 28.6* 27.4* 27.7*  MCV 94.4 95.3 97.9 97.2  PLT 127* 114* 108* 106*    Medications:    . sodium chloride   Intravenous Once  . antiseptic oral  rinse  7 mL Mouth Rinse 10 times per day  . chlorhexidine gluconate (SAGE KIT)  15 mL Mouth Rinse BID  . darbepoetin (ARANESP) injection - NON-DIALYSIS  100 mcg Subcutaneous Q Fri-1800  . feeding supplement (NEPRO CARB STEADY)  1,000 mL Per Tube Q24H  . feeding supplement (PRO-STAT SUGAR FREE 64)  60 mL Per Tube QID  . fluconazole (DIFLUCAN) IV  100 mg Intravenous Q24H  . insulin aspart  0-9 Units Subcutaneous Q4H  . insulin glargine  25 Units Subcutaneous BID  . levalbuterol  1.25 mg Nebulization Q6H  . pantoprazole sodium  40 mg Per Tube Daily  . QUEtiapine  50 mg Oral BID  . rifaximin  550 mg Oral TID  . sodium chloride flush  10-40 mL Intracatheter Q12H  . vancomycin  1,250 mg Intravenous Q48H  . vancomycin  500 mg Oral Q6H      Otelia Santee, MD 08/07/2015, 8:22 AM

## 2015-08-07 NOTE — Progress Notes (Signed)
      [address]       [place] [address]             [phone]      Family at bedside  BP has dropped into the 60s despite maximal doses of multiple inotropes  BP 91/31 mmHg  Pulse 70  Temp(Src) 97.7 F (36.5 C) (Axillary)  Resp 24  Ht 5\' 11"  (1.803 m)  Wt 208 lb 1.8 oz (94.4 kg)  BMI 29.04 kg/m2  SpO2 100%   Intake/Output Summary (Last 24 hours) at 08/07/15 1827 Last data filed at 08/07/15 1800  Gross per 24 hour  Intake 4173.2 ml  Output   3817 ml  Net  356.2 ml   Talked with his daughter, she understands there really isn't anything else to do at this point. She does not want to withdraw support but his comfort is the family's priority  Revonda Standard. Roxan Hockey, MD [facility] [phone]

## 2015-08-07 NOTE — Progress Notes (Signed)
Dr. Roxan Hockey made aware of patients sudden decline, with BPs now maintaing in the 60s/20s. Levophed and Epi maxed out. CRRT removal rate turned down to 0. No further orders to follow out at this time. Daughter, April, at bedside and made aware of patients changes and that there is nothing further I can do at this time.  Vena Austria

## 2015-08-07 DEATH — deceased

## 2015-08-08 LAB — MAGNESIUM: Magnesium: 2.6 mg/dL — ABNORMAL HIGH (ref 1.7–2.4)

## 2015-08-08 LAB — COMPREHENSIVE METABOLIC PANEL
ALT: 92 U/L — ABNORMAL HIGH (ref 17–63)
AST: 200 U/L — ABNORMAL HIGH (ref 15–41)
Albumin: 1.7 g/dL — ABNORMAL LOW (ref 3.5–5.0)
Alkaline Phosphatase: 1120 U/L — ABNORMAL HIGH (ref 38–126)
Anion gap: 10 (ref 5–15)
BUN: 41 mg/dL — ABNORMAL HIGH (ref 6–20)
CO2: 24 mmol/L (ref 22–32)
Calcium: 8.3 mg/dL — ABNORMAL LOW (ref 8.9–10.3)
Chloride: 99 mmol/L — ABNORMAL LOW (ref 101–111)
Creatinine, Ser: 1.71 mg/dL — ABNORMAL HIGH (ref 0.61–1.24)
GFR calc Af Amer: 46 mL/min — ABNORMAL LOW (ref >60)
GFR calc non Af Amer: 40 mL/min — ABNORMAL LOW (ref >60)
Glucose, Bld: 135 mg/dL — ABNORMAL HIGH (ref 65–99)
Potassium: 3.8 mmol/L (ref 3.5–5.1)
Sodium: 133 mmol/L — ABNORMAL LOW (ref 135–145)
Total Bilirubin: 42.4 mg/dL (ref 0.3–1.2)
Total Protein: 5.3 g/dL — ABNORMAL LOW (ref 6.5–8.1)

## 2015-08-08 LAB — CBC
HCT: 27.2 % — ABNORMAL LOW (ref 39.0–52.0)
Hemoglobin: 8.3 g/dL — ABNORMAL LOW (ref 13.0–17.0)
MCH: 30.1 pg (ref 26.0–34.0)
MCHC: 30.5 g/dL (ref 30.0–36.0)
MCV: 98.6 fL (ref 78.0–100.0)
Platelets: 118 10*3/uL — ABNORMAL LOW (ref 150–400)
RBC: 2.76 MIL/uL — ABNORMAL LOW (ref 4.22–5.81)
RDW: 24.2 % — ABNORMAL HIGH (ref 11.5–15.5)
WBC: 16.7 10*3/uL — ABNORMAL HIGH (ref 4.0–10.5)

## 2015-08-08 LAB — POCT I-STAT 3, ART BLOOD GAS (G3+)
Acid-base deficit: 1 mmol/L (ref 0.0–2.0)
Bicarbonate: 25 mEq/L — ABNORMAL HIGH (ref 20.0–24.0)
O2 Saturation: 95 %
PCO2 ART: 47.5 mmHg — AB (ref 35.0–45.0)
PH ART: 7.331 — AB (ref 7.350–7.450)
Patient temperature: 37.4
TCO2: 26 mmol/L (ref 0–100)
pO2, Arterial: 84 mmHg (ref 80.0–100.0)

## 2015-08-08 LAB — CARBOXYHEMOGLOBIN
Carboxyhemoglobin: 2 % — ABNORMAL HIGH (ref 0.5–1.5)
Methemoglobin: 0.9 % (ref 0.0–1.5)
O2 Saturation: 60.2 %
Total hemoglobin: 8.7 g/dL — ABNORMAL LOW (ref 13.5–18.0)

## 2015-08-08 LAB — GLUCOSE, CAPILLARY
GLUCOSE-CAPILLARY: 176 mg/dL — AB (ref 65–99)
GLUCOSE-CAPILLARY: 123 mg/dL — AB (ref 65–99)
GLUCOSE-CAPILLARY: 96 mg/dL (ref 65–99)
GLUCOSE-CAPILLARY: 99 mg/dL (ref 65–99)
Glucose-Capillary: 124 mg/dL — ABNORMAL HIGH (ref 65–99)
Glucose-Capillary: 110 mg/dL — ABNORMAL HIGH (ref 65–99)

## 2015-08-08 LAB — PHOSPHORUS: Phosphorus: 5 mg/dL — ABNORMAL HIGH (ref 2.5–4.6)

## 2015-08-08 MED ORDER — PANCRELIPASE (LIP-PROT-AMYL) 12000-38000 UNITS PO CPEP
2.0000 | ORAL_CAPSULE | Freq: Once | ORAL | Status: AC
Start: 2015-08-08 — End: 2015-08-08
  Administered 2015-08-08: 24000 [IU] via ORAL
  Filled 2015-08-08: qty 2

## 2015-08-08 MED ORDER — SODIUM BICARBONATE 650 MG PO TABS
650.0000 mg | ORAL_TABLET | Freq: Once | ORAL | Status: AC
  Administered 2015-08-08: 650 mg via ORAL
  Filled 2015-08-08: qty 1

## 2015-08-08 NOTE — Progress Notes (Signed)
Dr. Prescott Gum on unit and in to see patient, made aware that patients trach has a frequent cuff leak. Cuff has been reinflated multiple times already this morning by RN and RT. Dr. Prescott Gum states to keep replacing air in cuff as necessary and they will do a trach exchange tomorrow. Extra #6 Shiley trach placed at bedside per request in preparation for tomorrow. Daughter, April, made aware of this and the plan for trach exchange tomorrow.   Joseph [name]

## 2015-08-08 NOTE — Progress Notes (Signed)
16 Days Post-Op Procedure(s) (LRB): STERNAL CLOSURE WITH PUMP STANDBY (N/A) TRANSESOPHAGEAL ECHOCARDIOGRAM (TEE) (N/A) Subjective: Intubated, sedated  Objective: Vital signs in last 24 hours: Temp:  [97.4 F (36.3 C)-97.9 F (36.6 C)] 97.4 F (36.3 C) (07/02 0323) Pulse Rate:  [69-145] 79 (07/02 0700) Cardiac Rhythm:  [-] Normal sinus rhythm (07/01 2215) Resp:  [13-33] 19 (07/02 0700) BP: (91-139)/(31-43) 91/31 mmHg (07/01 1700) SpO2:  [98 %-100 %] 100 % (07/02 0700) Arterial Line BP: (60-192)/(26-59) 98/44 mmHg (07/02 0700) FiO2 (%):  [50 %] 50 % (07/02 0324) Weight:  [209 lb 14.1 oz (95.2 kg)] 209 lb 14.1 oz (95.2 kg) (07/02 0145)  Hemodynamic parameters for last 24 hours: CVP:  [15 mmHg] 15 mmHg  Intake/Output from previous day: 07/01 0701 - 07/02 0700 In: 4570.3 [I.V.:3100.3; NG/GT:1170; IV Piggyback:300] Out: 3032 [Stool:400; Chest Tube:40] Intake/Output this shift: Total I/O In: -  Out: -1   General appearance: jaundiced Heart: regular rate and rhythm Lungs: diminished breath sounds bilaterally  Lab Results:  Recent Labs  08/07/15 0346 08/08/15 0350  WBC 15.4* 16.7*  HGB 8.5* 8.3*  HCT 27.7* 27.2*  PLT 106* 118*   BMET:  Recent Labs  08/07/15 0347 08/08/15 0350  NA 133* 133*  K 3.8 3.8  CL 101 99*  CO2 24 24  GLUCOSE 145* 135*  BUN 46* 41*  CREATININE 1.74* 1.71*  CALCIUM 8.3* 8.3*    PT/INR: No results for input(s): LABPROT, INR in the last 72 hours. ABG    Component Value Date/Time   PHART 7.331* 08/08/2015 0351   HCO3 25.0* 08/08/2015 0351   TCO2 26 08/08/2015 0351   ACIDBASEDEF 1.0 08/08/2015 0351   O2SAT 60.2 08/08/2015 0431   CBG (last 3)   Recent Labs  08/07/15 2325 08/08/15 0320 08/08/15 0754  GLUCAP 176* 124* 123*    Assessment/Plan: S/P Procedure(s) (LRB): STERNAL CLOSURE WITH PUMP STANDBY (N/A) TRANSESOPHAGEAL ECHOCARDIOGRAM (TEE) (N/A) remains critically ill with multiple organ system failure CV- remains on high  doses of multiple pressors  BP better than yesterday afternoon, likely to to + i/o  RESP- VDRF, still vent dependent  RENAL- renal failure- on CRRT but unable to draw fluid  LIVER- hepatic failure  ID- on vancomycin  Prognosis extremely poor  LOS: 33 days    Joseph [name] 08/08/2015

## 2015-08-08 NOTE — Progress Notes (Signed)
[facility] Progress Note    Assessment/ Plan:   70 y.o. male with ESRD who was admitted 07/01/2015 with CP and subsequent redo CABG complicated by blood loss req transfusion and hemodynamic instability -s/p balloon pump, VDRF, shock, CRRT  1. CAD- complicated redo CABG- continues to be on pressors; high Levophed doses (currently at 52mg). 2. ESRD -TTS [facility]-on CRRT now since 6/17. Has left forearm AVF -Increased fluids pre filter fluid to prevent clotting. S/p 500cc bolus on 6/25. Continue to keep even with CRRT.  - For 1st time in well over a week he was not able to be kept even; currently UF is off as his pressures dropped into the 60's overnight. He's also on max Levophed (523m)  - bladder scan 6/27 --> 432mhich is not unexpected. - filter changed on 6/30  Seen on CRRT @ 715 am [id number] Pre/post 350/300 ml/hr Qd 1500ml59mUF OFF  No changes; will not try to keep even.  - Hemodynamically doing worse and may be time to consider a palliative consult if indicated as his daughter and family are having a difficult time as would be expected.  3. Anemia- has required transfusions- fairly stable h/h today. 4. HTN/volume- CRRT with volume removal (keep net even); fortunately able to at least keep even over past 24hrs. 5. Inc LFTs/hyperbilirubinemia  6. Alkalosis, improved off citrate, problematic usage with liver disease  Subjective:   Continues to be on the vent with high pressor requirements.  Overnight Levophed increased to 50 mcg and unable to keep net even.  Also on Epinephrine and Vasopressin     Objective:   BP 91/31 mmHg  Pulse 79  Temp(Src) 97.4 F (36.3 C) (Axillary)  Resp 19  Ht _0  (1.803 m)  Wt 95.2 kg (209 lb 14.1 oz)  BMI 29.29 kg/m2  SpO2 100%  Intake/Output Summary (Last 24 hours) at 08/08/15 0748 Last data filed at 08/08/15 0700  Gross per 24 hour  Intake 4570.34 ml  Output   3032 ml  Net 1538.34 ml    Weight change: 0.8 kg (1 lb 12.2 oz)  Physical Exam: General appearance: On vent, spontaneous eye opening but no purposeful movement or response to commands. Extremities: trace to 1+ in the lower extremities. Jaundiced  RRR No BS but soft abdomen Rales posteriorly; decreased BS right posterior. Rt Chest tube  Imaging: No results found.  Labs: BMET  Recent Labs Lab 08/04/15 0400 08/05/15 0410 08/05/15 1620 08/06/15 0400 08/06/15 1528 08/07/15 0346 08/07/15 0347 08/08/15 0350  NA 132* 133*  133* 132* 132*  133* 131* 133* 133* 133*  K 4.0 4.0  4.0 4.0 3.8  3.8 3.8 3.8 3.8 3.8  CL 100* 100*  100* 101 98*  99* 99* 99* 101 99*  CO2 _1 GLUCOSE 105* 121*  121* 148* 148*  146* 170* 149* 145* 135*  BUN 43* 44*  44* 42* 43*  43* 46* 41* 46* 41*  CREATININE 1.66* 1.63*  1.66* 1.78* 1.69*  1.60* 1.93* 1.76* 1.74* 1.71*  CALCIUM 8.4* 8.3*  8.3* 8.5* 8.5*  8.5* 8.5* 8.4* 8.3* 8.3*  PHOS 4.4 4.8*  4.7* 4.8* 4.3 5.1*  --  4.9* 5.0*   CBC  Recent Labs Lab 08/05/15 0410 08/06/15 0400 08/07/15 0346 08/08/15 0350  WBC 16.1* 15.6* 15.4* 16.7*  HGB 8.8* 8.6* 8.5* 8.3*  HCT 28.6* 27.4* 27.7* 27.2*  MCV 95.3 97.9 97.2 98.6  PLT  114* 108* 106* 118*    Medications:    . sodium chloride   Intravenous Once  . antiseptic oral rinse  7 mL Mouth Rinse 10 times per day  . chlorhexidine gluconate (SAGE KIT)  15 mL Mouth Rinse BID  . darbepoetin (ARANESP) injection - NON-DIALYSIS  100 mcg Subcutaneous Q Fri-1800  . feeding supplement (NEPRO CARB STEADY)  1,000 mL Per Tube Q24H  . feeding supplement (PRO-STAT SUGAR FREE 64)  60 mL Per Tube QID  . fluconazole (DIFLUCAN) IV  100 mg Intravenous Q24H  . insulin aspart  0-9 Units Subcutaneous Q4H  . insulin glargine  25 Units Subcutaneous BID  . levalbuterol  1.25 mg Nebulization Q6H  . lipase/protease/amylase  2 capsule Oral Once   And  . sodium bicarbonate  650 mg Oral Once  .  pantoprazole sodium  40 mg Per Tube Daily  . QUEtiapine  50 mg Oral BID  . rifaximin  550 mg Oral TID  . sodium chloride flush  10-40 mL Intracatheter Q12H  . vancomycin  1,250 mg Intravenous Q48H  . vancomycin  500 mg Oral Q6H      Otelia Santee, MD 08/08/2015, 7:48 AM

## 2015-08-08 NOTE — Progress Notes (Signed)
Spoke with Dr. Roxan Hockey on morning rounds regarding patients Cortrak tube becoming clogged during the night. Multiple unsuccessful attempts made to unclog Cortrak per protocol and Cortrak removed. I also made Dr. Roxan Hockey aware that there is no Cortrak coverage on Sundays and that it will be tomorrow before the tube is replaced. New order placed for Cortrak to be replaced tomorrow.   Vena Austria

## 2015-08-09 ENCOUNTER — Inpatient Hospital Stay (HOSPITAL_COMMUNITY): Payer: Medicare Other

## 2015-08-09 DIAGNOSIS — R579 Shock, unspecified: Secondary | ICD-10-CM

## 2015-08-09 LAB — GLUCOSE, CAPILLARY
GLUCOSE-CAPILLARY: 125 mg/dL — AB (ref 65–99)
GLUCOSE-CAPILLARY: 100 mg/dL — AB (ref 65–99)
Glucose-Capillary: 124 mg/dL — ABNORMAL HIGH (ref 65–99)

## 2015-08-09 LAB — COMPREHENSIVE METABOLIC PANEL
ALT: 80 U/L — ABNORMAL HIGH (ref 17–63)
AST: 125 U/L — ABNORMAL HIGH (ref 15–41)
Albumin: 1.7 g/dL — ABNORMAL LOW (ref 3.5–5.0)
Alkaline Phosphatase: 867 U/L — ABNORMAL HIGH (ref 38–126)
Anion gap: 17 — ABNORMAL HIGH (ref 5–15)
BUN: 28 mg/dL — ABNORMAL HIGH (ref 6–20)
CO2: 25 mmol/L (ref 22–32)
Calcium: 9.2 mg/dL (ref 8.9–10.3)
Chloride: 94 mmol/L — ABNORMAL LOW (ref 101–111)
Creatinine, Ser: 1.41 mg/dL — ABNORMAL HIGH (ref 0.61–1.24)
GFR calc Af Amer: 58 mL/min — ABNORMAL LOW (ref >60)
GFR calc non Af Amer: 50 mL/min — ABNORMAL LOW (ref >60)
Glucose, Bld: 144 mg/dL — ABNORMAL HIGH (ref 65–99)
Potassium: 3.9 mmol/L (ref 3.5–5.1)
Sodium: 136 mmol/L (ref 135–145)
Total Bilirubin: 37.9 mg/dL (ref 0.3–1.2)
Total Protein: 5.4 g/dL — ABNORMAL LOW (ref 6.5–8.1)

## 2015-08-09 LAB — POCT I-STAT 3, ART BLOOD GAS (G3+)
ACID-BASE DEFICIT: 3 mmol/L — AB (ref 0.0–2.0)
Acid-Base Excess: 1 mmol/L (ref 0.0–2.0)
BICARBONATE: 27.1 meq/L — AB (ref 20.0–24.0)
Bicarbonate: 28.3 mEq/L — ABNORMAL HIGH (ref 20.0–24.0)
O2 Saturation: 87 %
O2 Saturation: 94 %
PCO2 ART: 57.1 mmHg — AB (ref 35.0–45.0)
PH ART: 7.299 — AB (ref 7.350–7.450)
PO2 ART: 79 mmHg — AB (ref 80.0–100.0)
Patient temperature: 97.4
TCO2: 29 mmol/L (ref 0–100)
TCO2: 30 mmol/L (ref 0–100)
pCO2 arterial: 62.9 mmHg (ref 35.0–45.0)
pH, Arterial: 7.215 — ABNORMAL LOW (ref 7.350–7.450)
pO2, Arterial: 51 mmHg — ABNORMAL LOW (ref 80.0–100.0)

## 2015-08-09 LAB — CBC
HCT: 28.6 % — ABNORMAL LOW (ref 39.0–52.0)
Hemoglobin: 8.5 g/dL — ABNORMAL LOW (ref 13.0–17.0)
MCH: 29.5 pg (ref 26.0–34.0)
MCHC: 29.7 g/dL — ABNORMAL LOW (ref 30.0–36.0)
MCV: 99.3 fL (ref 78.0–100.0)
Platelets: 132 10*3/uL — ABNORMAL LOW (ref 150–400)
RBC: 2.88 MIL/uL — ABNORMAL LOW (ref 4.22–5.81)
RDW: 24.2 % — ABNORMAL HIGH (ref 11.5–15.5)
WBC: 18.1 10*3/uL — ABNORMAL HIGH (ref 4.0–10.5)

## 2015-08-09 LAB — RENAL FUNCTION PANEL
ANION GAP: 17 — AB (ref 5–15)
Albumin: 1.8 g/dL — ABNORMAL LOW (ref 3.5–5.0)
BUN: 27 mg/dL — ABNORMAL HIGH (ref 6–20)
CALCIUM: 9.2 mg/dL (ref 8.9–10.3)
CHLORIDE: 95 mmol/L — AB (ref 101–111)
CO2: 26 mmol/L (ref 22–32)
Creatinine, Ser: 1.36 mg/dL — ABNORMAL HIGH (ref 0.61–1.24)
GFR calc non Af Amer: 52 mL/min — ABNORMAL LOW (ref >60)
GLUCOSE: 144 mg/dL — AB (ref 65–99)
Phosphorus: 5.7 mg/dL — ABNORMAL HIGH (ref 2.5–4.6)
Potassium: 3.9 mmol/L (ref 3.5–5.1)
SODIUM: 138 mmol/L (ref 135–145)

## 2015-08-09 LAB — MAGNESIUM: Magnesium: 2.6 mg/dL — ABNORMAL HIGH (ref 1.7–2.4)

## 2015-08-09 LAB — CALCIUM, IONIZED: Calcium, Ionized, Serum: 4.6 mg/dL (ref 4.5–5.6)

## 2015-08-09 LAB — CLOSTRIDIUM DIFFICILE BY PCR: CDIFFPCR: POSITIVE — AB

## 2015-08-09 LAB — C DIFFICILE QUICK SCREEN W PCR REFLEX
C Diff antigen: POSITIVE — AB
C Diff toxin: NEGATIVE

## 2015-08-09 NOTE — Progress Notes (Signed)
RT at bedside due to cuff leak from trach. The leak persists despite repeated attempt to inflate it with more air. Patient is to unstable to attempt a trach change in the middle of the night. MD is aware and instructed to wait until morning for trach change.

## 2015-08-09 NOTE — Progress Notes (Signed)
eLink Physician-Brief Progress Note Patient Name: Joseph [name] DOB: 1945-11-08 MRN: [id number]   Date of Service  08/09/2015  HPI/Events of Note  Trach cuff leak - ABG = 7.21/62.9/51  eICU Interventions  Will need Trach changed. Will ask Dr. Corrie Dandy to see patient.     Intervention Category Intermediate Interventions: Respiratory distress - evaluation and management  Joseph [name] 08/09/2015, 4:33 AM

## 2015-08-09 NOTE — Progress Notes (Signed)
   [facility] PCCM  Pt's trache's cuff has been leaking all day. RT has to inflate frequently. Difficult to ventilate with TV in 200-300 mls. ABG with more hypercapnea and hypoxemia.  Trache changed at bedside. #6 shiley cuffed was inserted w/o difficulty. Color change from purple to yellow seen. TV better at 450-500 mls.    Monica Becton, MD 08/09/2015, 5:06 AM [facility] Pager [phone] After 3 pm or if no answer, call [phone]

## 2015-08-09 NOTE — Progress Notes (Addendum)
eLink Physician-Brief Progress Note Patient Name: [name] DOB: October 13, 1945 MRN: [id number]   Date of Service  08/09/2015  HPI/Events of Note  Patient has large trach cuff leak. Sat = 100%  eICU Interventions  Will check ABG now.      Intervention Category Intermediate Interventions: Respiratory distress - evaluation and management  Sommer,Steven Eugene 08/09/2015, 3:47 AM

## 2015-08-09 NOTE — Progress Notes (Signed)
PULMONARY / CRITICAL CARE MEDICINE   Name: Joseph [name] MRN: [id number] DOB: May 24, 1945    ADMISSION DATE:  06/18/2015 CONSULTATION DATE:  07/16/2015 LOS 2 days  REFERRING MD:  Nils Pyle  CHIEF COMPLAINT:  Post cardiothoracic surgery ventilator management  subj   70 y/o male with ESRD and significant CAD (CABG 2001, DES 2016, EF 40-45%) was admitted on 5/29 with NSTEMI and had persistent chest pain during his hospitalization so he was taken for a redo CABG on 6/8 (SVG to diag and SVG to OM, IABP placement and CVL placement).  Post operatively he had excessive bleeding from all chest drains (pleural and mediastinal) so he was taken back to the OR on 6/9 for re-exploration.  PCCM was consulted for ventilator management.     STUDIES:  6/14 TTE >EF 30-35%  CULTURES: Sputum 6/10 >> neg  ANTIBIOTICS:  6/8 Vanc IV  >>  6/9 Zosyn>>6/17 6/17 Primaxin >>6/28 ............... 6/26 Fluconazole>6/27 (by MD, got 2 doses) ................. 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 ( 7-10 days)      LINES/TUBES: Left forearm fistula 6/8 Multiple chest tubes (mediastinal and pleural bilateral) >> 6/8 CVC left femoral vein >>out 6/8 R radial arterial line >>6/17>>>6/17>>> 6/8 L IJ [place] >>6/17 L [place] TLC 6/16>>> R [place] HD catheter 6/23 >>  Trach 6/20 >> changed 6/27>>  SIGNIFICANT EVENTS: 6/8 Redo CABG 6/8 take back to OR emergently for diffuse bleeding 6/9 - Rx CRRT 6/10 - start paralysis continuous 6/14 - dc paralysis 6/16 back to OR to close sternum 6/17 - dc IABP 6/20 - s/p trach  Elzia Hott 07/28/15 - post pyloric tube 07/31/15  - rt subclavian trialysis catgh 6/25:  > norepi up to 40, epi down to 6. CVVHD running. He tolerates PSV, is tachypneic no matter what vent mode mode we use. Note rare fungus on resp cx 6/26: on trach. On CRRT;keeping even On pressors - epi 6. Levophed 40-50 for sbp > 100. On precedex.  Not following commands. Off precerdex wob increases. On PRVC; RN says does well on day time SBT. VERY JAUNDICED 6/27 trach exchange 6/28  - LOS 29 days. 10% rt ptx and Dr Lucianne Lei Tright placed chest tube.  CRT contnues.  Per RN on levophed, epi, vasipresson and milrinone. Cuff leak with trach +, trach changed 6/27  08/05/15 - LOS 30 days. Multiple pressors - levophed, epi and vasopression and milrinone. GI has dx with c diff and cirrhosis with MELD 40 . They are advising xifaxan. CRRT continues and daughte April at bedside witout questions  08/06/15 - now DNR but still full medical care with pressors and CRRT. Daughter not ready to withdraw per CVTS notes. Per RN - No CPR not yet in notes because CVTS plans to talk to patient   SUBJECTIVE/OVERNIGHT/INTERVAL HX Maxed on Epi, Norepi and Vaso with SBP in the 60's, unresponsive.  Trach changed overnight due to cuff leak.  VITAL SIGNS: BP 86/40 mmHg  Pulse 67  Temp(Src) 90.5 F (32.5 C) (Rectal)  Resp 17  Ht 5\' 11"  (1.803 m)  Wt 96.6 kg (212 lb 15.4 oz)  BMI 29.72 kg/m2  SpO2 100%  HEMODYNAMICS: CVP:  [11 mmHg] 11 mmHg  VENTILATOR SETTINGS: Vent Mode:  [-] PRVC FiO2 (%):  [60 %] 60 % Set Rate:  [16 bmp] 16 bmp Vt Set:  [500 mL] 500 mL PEEP:  [5 cmH20] 5 cmH20 Plateau Pressure:  [  12 cmH20-26 cmH20] 26 cmH20  INTAKE / OUTPUT: I/O last 3 completed shifts: In: 5954.6 [I.V.:5204.6; NG/GT:450; IV Piggyback:300] Out: [id number] [Other:2946; Stool:150; Chest Tube:40]  PHYSICAL EXAMINATION: General:  Critically ill on vent, CVVHD, jaundiced. Icteric.  Neuro:  Sedated, not following commands HEENT:  Scleral icterus.  TRACH in place Cardiovascular:  median sternotomy dressing dr. Kermit Balo s1/s2. (-) s3/m/r/g Lungs:  Fair ae.On PRVC rr 40 Abdomen:  Distended, some focal superficial bruising, dec BS Musculoskeletal:  No bony abnormalitiies Skin:  Bruising all over extensor surfaces and belly, Gr 1 edema  LABS:  PULMONARY  Recent Labs Lab  08/06/15 2231 08/07/15 0342 08/07/15 0400 08/07/15 1920 08/08/15 0351 08/08/15 0431 08/09/15 0400  PHART 7.327* 7.347*  --  7.390 7.331*  --  7.215*  PCO2ART 43.9 47.0*  --  43.4 47.5*  --  62.9*  PO2ART 73.0* 96.0  --  86.0 84.0  --  51.0*  HCO3 23.0 25.8*  --  26.4* 25.0*  --  27.1*  TCO2 24 27  --  28 26  --  29  O2SAT 93.0 97.0 65.9 97.0 95.0 60.2 87.0    CBC  Recent Labs Lab 08/07/15 0346 08/08/15 0350 08/09/15 0330  HGB 8.5* 8.3* 8.5*  HCT 27.7* 27.2* 28.6*  WBC 15.4* 16.7* 18.1*  PLT 106* 118* 132*    COAGULATION  Recent Labs Lab 08/03/15 1815  INR 1.58*    CARDIAC  No results for input(s): TROPONINI in the last 168 hours. No results for input(s): PROBNP in the last 168 hours.   CHEMISTRY  Recent Labs Lab 08/05/15 0410  08/06/15 0400 08/06/15 1528 08/07/15 0346 08/07/15 0347 08/08/15 0350 08/09/15 0330  NA 133*  133*  < > 132*  133* 131* 133* 133* 133* 136  138  K 4.0  4.0  < > 3.8  3.8 3.8 3.8 3.8 3.8 3.9  3.9  CL 100*  100*  < > 98*  99* 99* 99* 101 99* 94*  95*  CO2 24  24  < > 24  25 22 24 24 24 25  26   GLUCOSE 121*  121*  < > 148*  146* 170* 149* 145* 135* 144*  144*  BUN 44*  44*  < > 43*  43* 46* 41* 46* 41* 28*  27*  CREATININE 1.63*  1.66*  < > 1.69*  1.60* 1.93* 1.76* 1.74* 1.71* 1.41*  1.36*  CALCIUM 8.3*  8.3*  < > 8.5*  8.5* 8.5* 8.4* 8.3* 8.3* 9.2  9.2  MG 2.5*  --  2.7*  --  2.6*  --  2.6* 2.6*  PHOS 4.8*  4.7*  < > 4.3 5.1*  --  4.9* 5.0* 5.7*  < > = values in this interval not displayed. Estimated Creatinine Clearance: 62.5 mL/min (by C-G formula based on Cr of 1.36).   LIVER  Recent Labs Lab 08/03/15 1815  08/05/15 0410  08/06/15 0400 08/06/15 1528 08/07/15 0346 08/07/15 0347 08/08/15 0350 08/09/15 0330  AST  --   < > 238*  --  216*  --  204*  --  200* 125*  ALT  --   < > 81*  --  74*  --  81*  --  92* 80*  ALKPHOS  --   < > 1259*  --  1138*  --  1154*  --  1120* 867*  BILITOT  --   < >  44.8*  --  42.4*  --  44.8*  --  42.4* 37.9*  PROT  --   < > 5.1*  --  5.3*  --  5.4*  --  5.3* 5.4*  ALBUMIN  --   < > 1.8*  1.8*  < > 2.0*  2.0* 2.0* 1.8* 1.9* 1.7* 1.7*  1.8*  INR 1.58*  --   --   --   --   --   --   --   --   --   < > = values in this interval not displayed.   INFECTIOUS  Recent Labs Lab 08/04/15 0009  LATICACIDVEN 0.8     ENDOCRINE CBG (last 3)   Recent Labs  08/08/15 1612 08/08/15 2018 08/09/15 0229  GLUCAP 96 99 125*         IMAGING x48h  - image(s) personally visualized  -   highlighted in bold Dg Chest Port 1 View  08/09/2015  CLINICAL DATA:  Pneumothorax. EXAM: PORTABLE CHEST 1 VIEW COMPARISON:  08/06/2015. FINDINGS: The support apparatus is stable. There is a small persistent hydro pneumothorax on the right side. Persistent interstitial and airspace process in the lungs, likely pulmonary edema. No large pleural effusions. The tracheostomy tube is stable. The feeding tube is been removed. IMPRESSION: Stable cardiac enlargement and pulmonary edema. Stable support apparatus.  Small persistent pneumothorax. Electronically Signed   By: Marijo Sanes M.D.   On: 08/09/2015 07:23       DISCUSSION: 70 y/o male s/p redo CABG 6/8 complicated by significant post operative bleeding in the setting of thrombocytopenia brought back to the Surgical ICU on 6/9 with hypoxemic respiratory failure.  He has baseline ESRD and DM2.  ASSESSMENT / PLAN:  PULMONARY A: Acute hypoxemic respiratory failure 2/2  Pulm edema, possible HCAP S/P Trache 6/20 by JY and cuff change ? 08/04/15 by Dr Titus Mould Post operative pleural bleeding   - on PRVC  40% fio2, peep 5. Curff leak + via trach. New Rt ptx - chest tube by CVTS ? 6/28 - unchanged 08/07/15  P:   Respiratory acidosis, RR increased. F/U ABG. Chest tubes per thoracic surgery, replaced CT per surgery.  CARDIOVASCULAR A:  Severe Cardiogenic and hemorrhagic shock CAD s/p redo CABG IABP d/c'd on 6/18   on  multiple pressors P:  Cont levophed and epi and vaso and milrinone drips, no further increase Tele monitoring. Amiodarone off  RENAL A:   ESRD CKD V  on CRRT P:   Continue CVVHD per renal -- even at this point given hypotension. Renal following Replace electrolytes as indicated.  GASTROINTESTINAL A:   Jaundice and coagulopathy due to shocked liver. P:   OG tube TPN per pharmacy due to high residuals. H2 blocker for stress ulcer prophylaxis Abdominal U/S negative for obstruction, likely shocked liver.  HEMATOLOGIC  Recent Labs  08/08/15 0350 08/09/15 0330  HGB 8.3* 8.5*   A:   Diffuse oozing due to coagulopathy from blood loss, thrombocytopenia. Improved.  P:  Transfuse per TCTS guidelines. Trend CBC, coags.  INFECTIOUS A:   ?HCAP - doubt, respiratory culture negative Rare fungus in resp cx Also C Dif -  new 6/27 P:   Anti-infectives    Start     Dose/Rate Route Frequency Ordered Stop   08/07/15 2100  vancomycin (VANCOCIN) 1,250 mg in sodium chloride 0.9 % 250 mL IVPB     1,250 mg 166.7 mL/hr over 90 Minutes Intravenous Every 48 hours 08/06/15 2240     08/05/15 1145  rifaximin (XIFAXAN) tablet 550 mg     550 mg  Oral 3 times daily 08/05/15 1131 08/12/15 0959   08/04/15 1800  vancomycin (VANCOCIN) 50 mg/mL oral solution 125 mg  Status:  Discontinued     125 mg Oral Every 6 hours 08/04/15 1559 08/04/15 1559   08/04/15 1800  vancomycin (VANCOCIN) 50 mg/mL oral solution 500 mg     500 mg Oral Every 6 hours 08/04/15 1559     08/04/15 1100  fluconazole (DIFLUCAN) IVPB 100 mg     100 mg 50 mL/hr over 60 Minutes Intravenous Every 24 hours 08/04/15 0942     08/03/15 2200  vancomycin (VANCOCIN) IVPB 1000 mg/200 mL premix  Status:  Discontinued     1,000 mg 200 mL/hr over 60 Minutes Intravenous Every 24 hours 08/03/15 2118 08/06/15 2231   08/03/15 1000  metroNIDAZOLE (FLAGYL) tablet 500 mg  Status:  Discontinued     500 mg Oral 3 times daily 08/03/15 0753 08/04/15  1559   08/03/15 0730  fluconazole (DIFLUCAN) IVPB 100 mg     100 mg 50 mL/hr over 60 Minutes Intravenous  Once 08/03/15 0721 08/03/15 0930   08/03/15 0000  vancomycin (VANCOCIN) IVPB 1000 mg/200 mL premix  Status:  Discontinued     1,000 mg 200 mL/hr over 60 Minutes Intravenous  Once 08/02/15 1012 08/02/15 1843   08/03/15 0000  vancomycin (VANCOCIN) IVPB 1000 mg/200 mL premix     1,000 mg 200 mL/hr over 60 Minutes Intravenous  Once 08/02/15 1843 08/02/15 2118   08/02/15 1845  vancomycin (VANCOCIN) IVPB 1000 mg/200 mL premix  Status:  Discontinued     1,000 mg 200 mL/hr over 60 Minutes Intravenous Every 12 hours 08/02/15 1842 08/02/15 1843   08/02/15 1330  fluconazole (DIFLUCAN) IVPB 100 mg     100 mg 50 mL/hr over 60 Minutes Intravenous  Once 08/02/15 1318 08/02/15 1512   08/01/15 2200  vancomycin (VANCOCIN) IVPB 1000 mg/200 mL premix     1,000 mg 200 mL/hr over 60 Minutes Intravenous  Once 08/01/15 1130 08/01/15 2333   07/31/15 1600  vancomycin (VANCOCIN) IVPB 1000 mg/200 mL premix     1,000 mg 200 mL/hr over 60 Minutes Intravenous  Once 07/31/15 1144 07/31/15 1656   07/30/15 1000  vancomycin (VANCOCIN) IVPB 1000 mg/200 mL premix     1,000 mg 200 mL/hr over 60 Minutes Intravenous  Once 07/30/15 0826 07/30/15 1022   07/26/15 0830  fluconazole (DIFLUCAN) IVPB 100 mg     100 mg 50 mL/hr over 60 Minutes Intravenous Every 24 hours 07/26/15 0726 07/27/15 1031   07/25/15 1830  vancomycin (VANCOCIN) IVPB 1000 mg/200 mL premix  Status:  Discontinued     1,000 mg 200 mL/hr over 60 Minutes Intravenous Every 24 hours 07/25/15 1332 07/29/15 1538   07/24/15 1400  imipenem-cilastatin (PRIMAXIN) 500 mg in sodium chloride 0.9 % 100 mL IVPB  Status:  Discontinued     500 mg 200 mL/hr over 30 Minutes Intravenous Every 8 hours 07/24/15 1139 08/04/15 2037   07/08/2015 1545  fluconazole (DIFLUCAN) tablet 100 mg     100 mg Oral  Once 07/29/2015 1534 07/15/2015 1617   07/15/2015 1300  piperacillin-tazobactam  (ZOSYN) IVPB 2.25 g  Status:  Discontinued     2.25 g 100 mL/hr over 30 Minutes Intravenous Every 8 hours 07/16/2015 1224 07/24/15 1142   07/19/2015 0927  vancomycin (VANCOCIN) 1,000 mg in sodium chloride 0.9 % 1,000 mL irrigation  Status:  Discontinued       As needed 07/18/2015 0928 07/15/2015 1039  07/19/2015 0926  polymyxin B 500,000 Units, bacitracin 50,000 Units in sodium chloride irrigation 0.9 % 500 mL irrigation  Status:  Discontinued       As needed 07/17/2015 0927 07/12/2015 1039   08/04/2015 0745  vancomycin (VANCOCIN) 1,000 mg in sodium chloride 0.9 % 1,000 mL irrigation  Status:  Discontinued      Irrigation To Surgery 07/10/2015 0733 07/13/2015 1047   07/21/15 1115  vancomycin (VANCOCIN) IVPB 1000 mg/200 mL premix  Status:  Discontinued     1,000 mg 200 mL/hr over 60 Minutes Intravenous Every 24 hours 07/21/15 1101 07/16/2015 1224   07/18/15 1045  vancomycin (VANCOCIN) 1,250 mg in sodium chloride 0.9 % 250 mL IVPB  Status:  Discontinued     1,250 mg 166.7 mL/hr over 90 Minutes Intravenous Every 24 hours 07/18/15 1043 07/21/15 1101   07/17/15 0400  piperacillin-tazobactam (ZOSYN) IVPB 3.375 g  Status:  Discontinued     3.375 g 100 mL/hr over 30 Minutes Intravenous Every 6 hours 07/16/15 2134 07/22/2015 1224   07/16/15 2030  piperacillin-tazobactam (ZOSYN) IVPB 3.375 g  Status:  Discontinued     3.375 g 12.5 mL/hr over 240 Minutes Intravenous Every 8 hours 07/16/15 1856 07/16/15 2134   07/16/15 2000  vancomycin (VANCOCIN) IVPB 1000 mg/200 mL premix     1,000 mg 200 mL/hr over 60 Minutes Intravenous  Once 07/16/15 1858 07/16/15 2046   07/16/2015 2315  vancomycin (VANCOCIN) IVPB 1000 mg/200 mL premix  Status:  Discontinued     1,000 mg 200 mL/hr over 60 Minutes Intravenous  Once 07/19/2015 1911 07/16/15 1858   07/29/2015 2100  cefUROXime (ZINACEF) 1.5 g in dextrose 5 % 50 mL IVPB  Status:  Discontinued     1.5 g 100 mL/hr over 30 Minutes Intravenous Every 12 hours 07/26/2015 1911 07/16/15 1856   07/27/2015  1130  cefUROXime (ZINACEF) 750 mg in dextrose 5 % 50 mL IVPB  Status:  Discontinued     750 mg 100 mL/hr over 30 Minutes Intravenous To Surgery 07/24/2015 1122 07/31/2015 1902   07/14/2015 0400  vancomycin (VANCOCIN) 1,250 mg in sodium chloride 0.9 % 250 mL IVPB  Status:  Discontinued     1,250 mg 166.7 mL/hr over 90 Minutes Intravenous To Surgery 07/14/15 1054 07/14/15 1435   07/29/2015 0400  cefUROXime (ZINACEF) 1.5 g in dextrose 5 % 50 mL IVPB  Status:  Discontinued     1.5 g 100 mL/hr over 30 Minutes Intravenous To Surgery 07/14/15 1054 07/14/15 1435   07/24/2015 0400  cefUROXime (ZINACEF) 750 mg in dextrose 5 % 50 mL IVPB  Status:  Discontinued     750 mg 100 mL/hr over 30 Minutes Intravenous To Surgery 07/14/15 1053 07/14/15 1435   08/02/2015 0400  vancomycin (VANCOCIN) 1,500 mg in sodium chloride 0.9 % 250 mL IVPB     1,500 mg 125 mL/hr over 120 Minutes Intravenous To Surgery 07/14/15 1432 08/02/2015 0745   07/26/2015 0400  cefUROXime (ZINACEF) 1.5 g in dextrose 5 % 50 mL IVPB     1.5 g 100 mL/hr over 30 Minutes Intravenous To Surgery 07/14/15 1432 07/09/2015 1654   07/14/2015 0400  cefUROXime (ZINACEF) 750 mg in dextrose 5 % 50 mL IVPB  Status:  Discontinued     750 mg 100 mL/hr over 30 Minutes Intravenous To Surgery 07/14/15 1429 08/05/2015 1902     ENDOCRINE CBG (last 3)   Recent Labs  08/08/15 1612 08/08/15 2018 08/09/15 0229  GLUCAP 96 99 125*   A:  DM2   P:   Monitor glucose Insulin gtt per TCTS post op protocol  NEUROLOGIC A:   Sedation needed  for vent synchrony   - intermittently interacts P:   RASS goal: -2 to -3 Work to decrease sedation as able.   FAMILY  - Updates:  To daughters bedside, all questions answered, DNR essentially at this point, maxed on 3 pressors and SBP is in the 50's.  Discussed with Dr. Nils Pyle, no further escalation of care at this point.  GLOBAL Cirrhosis and MELD score 40 - 3 mo mortality > 70% is itself very ery high just on that. In  addition PROVENT score - for 1 year mortality - age > 20, vent dependent > 21 days, platlets < 150, vasopressor need and CRRT  Suggests > 95- 100% 1 year mortality/   The patient is critically ill with multiple organ systems failure and requires high complexity decision making for assessment and support, frequent evaluation and titration of therapies, application of advanced monitoring technologies and extensive interpretation of multiple databases.   Critical Care Time devoted to patient care services described in this note is  35  Minutes. This time reflects time of care of this signee Dr [name]. This critical care time does not reflect procedure time, or teaching time or supervisory time of PA/NP/Med student/Med Resident etc but could involve care discussion time.  Rush Farmer, M.D. [facility] Pulmonary/Critical Care Medicine. Pager: [phone]. After hours pager: [phone].  08/09/2015 8:26 AM

## 2015-08-09 NOTE — Progress Notes (Signed)
ANTIBIOTIC CONSULT NOTE - Follow-Up  Pharmacy Consult for Vancomycin Indication: Sepsis, pneumonia  Allergies  Allergen Reactions  . Sulfa Antibiotics Hives and Itching  . Morphine And Related     Makes patient feel weird    Patient Measurements: Height: 5\' 11"  (180.3 cm) Weight: 212 lb 15.4 oz (96.6 kg) IBW/kg (Calculated) : 75.3 Adjusted Body Weight:   Vital Signs: Temp: 90.5 F (32.5 C) (07/03 0419) Temp Source: Rectal (07/03 0419) BP: 49/20 mmHg (07/03 0743) Pulse Rate: 51 (07/03 0945) Intake/Output from previous day: 07/02 0701 - 07/03 0700 In: 3555.7 [I.V.:3505.7; IV Piggyback:50] Out: 2001 [Stool:50; Chest Tube:10] Intake/Output from this shift: Total I/O In: 466.5 [I.V.:466.5] Out: 0   Labs:  Recent Labs  08/07/15 0346 08/07/15 0347 08/08/15 0350 08/09/15 0330  WBC 15.4*  --  16.7* 18.1*  HGB 8.5*  --  8.3* 8.5*  PLT 106*  --  118* 132*  CREATININE 1.76* 1.74* 1.71* 1.41*  1.36*   Estimated Creatinine Clearance: 62.5 mL/min (by C-G formula based on Cr of 1.36).  Recent Labs  08/06/15 2110  VANCOTROUGH 46*     Admit Complaint: 70 yo M with known CAD admitted 5/29 with CP > NSTEMI>CABG 6/8. back to OR emergently 6/8 for diffuse bleeding. Received Factor VII and has received multiple transfusions. Patient remains on vancomycin (day 23 for ?HCAP). -WBC= 18.1, afebrile, noted on CRRT  6/8 Vanc >>  6/17 Primaxin >> 6/26 Fluconazole>6/27 (by MD, got 2 doses) 6/27 Flagyl>> 6/9 Zosyn>>6/17  6/23 VR 22, Vanco 1g x 1 6/24 VR 21 - Vanco 1g IV x 1 6/25: Vanco 1g IV x 1 (30.5 hr later) 6/26: Vanco 1g IV x 1 (22 hr later, RN gave 4 hrs earlier than scheduled?) 6/27: VR 20 6/30: VT 27 on 1g q24h  6/10 TA: ngF 6/7 surg mrsa pcr: neg 6/16 bld - ngtd 6/16 resp cx - ngF 6/22 BCx2: ngtd 6/22 RCx: rare yeast 6/26: Cdiff  antigen+, toxin negative 6/26: Trach aspirate: Moderate yeast  Goal of Therapy:  Vancomycin trough level 15-20 mcg/ml  Plan:   Continue vancomycin 1250 mg IV q48h Consider d/c vancomycin? follow renal function and clinical progress  Hildred Laser, Pharm D 08/09/2015 10:36 AM

## 2015-08-09 NOTE — Progress Notes (Signed)
[facility] Progress Note    Assessment/ Plan:     Subjective:   Remains on ventilator, 3 pressors with BP in the 62's Surgeons have spoken with family - no de-escalation of care planned (but no escalation either) I spoke with daughter and made aware that dialysis (keeping K in check) will prolong the inevitable (though maybe only to a small degree)    Objective:   BP 49/20 mmHg  Pulse 51  Temp(Src) 90.5 F (32.5 C) (Rectal)  Resp 19  Ht 5' 11" (1.803 m)  Wt 96.6 kg (212 lb 15.4 oz)  BMI 29.72 kg/m2  SpO2 100%  Intake/Output Summary (Last 24 hours) at 08/09/15 1050 Last data filed at 08/09/15 1000  Gross per 24 hour  Intake 3550.74 ml  Output   1986 ml  Net 1564.74 ml   Weight change: 1.4 kg (3 lb 1.4 oz)  Physical Exam: On vent, eyes closed, currently appears comfortable Jaundiced Trach/vent/chest tube Regular 70's (w/BP 55) Edema unchanged Weight up 2 kg/48 hours  Imaging: Dg Chest Port 1 View  08/09/2015  CLINICAL DATA:  Pneumothorax. EXAM: PORTABLE CHEST 1 VIEW COMPARISON:  08/06/2015. FINDINGS: The support apparatus is stable. There is a small persistent hydro pneumothorax on the right side. Persistent interstitial and airspace process in the lungs, likely pulmonary edema. No large pleural effusions. The tracheostomy tube is stable. The feeding tube is been removed. IMPRESSION: Stable cardiac enlargement and pulmonary edema. Stable support apparatus.  Small persistent pneumothorax. Electronically Signed   By: Marijo Sanes M.D.   On: 08/09/2015 07:23    Labs:   Recent Labs Lab 08/05/15 0410 08/05/15 1620 08/06/15 0400 08/06/15 1528 08/07/15 0346 08/07/15 0347 08/08/15 0350 08/09/15 0330  NA 133*  133* 132* 132*  133* 131* 133* 133* 133* 136  138  K 4.0  4.0 4.0 3.8  3.8 3.8 3.8 3.8 3.8 3.9  3.9  CL 100*  100* 101 98*  99* 99* 99* 101 99* 94*  95*  CO2 _0 GLUCOSE 121*  121* 148* 148*   146* 170* 149* 145* 135* 144*  144*  BUN 44*  44* 42* 43*  43* 46* 41* 46* 41* 28*  27*  CREATININE 1.63*  1.66* 1.78* 1.69*  1.60* 1.93* 1.76* 1.74* 1.71* 1.41*  1.36*  CALCIUM 8.3*  8.3* 8.5* 8.5*  8.5* 8.5* 8.4* 8.3* 8.3* 9.2  9.2  PHOS 4.8*  4.7* 4.8* 4.3 5.1*  --  4.9* 5.0* 5.7*   CBC  Recent Labs Lab 08/06/15 0400 08/07/15 0346 08/08/15 0350 08/09/15 0330  WBC 15.6* 15.4* 16.7* 18.1*  HGB 8.6* 8.5* 8.3* 8.5*  HCT 27.4* 27.7* 27.2* 28.6*  MCV 97.9 97.2 98.6 99.3  PLT 108* 106* 118* 132*   ABG    Component Value Date/Time   PHART 7.299* 08/09/2015 0915   PCO2ART 57.1* 08/09/2015 0915   PO2ART 79.0* 08/09/2015 0915   HCO3 28.3* 08/09/2015 0915   TCO2 30 08/09/2015 0915   ACIDBASEDEF 3.0* 08/09/2015 0400   O2SAT 94.0 08/09/2015 0915     Medications:    . sodium chloride   Intravenous Once  . antiseptic oral rinse  7 mL Mouth Rinse 10 times per day  . chlorhexidine gluconate (SAGE KIT)  15 mL Mouth Rinse BID  . darbepoetin (ARANESP) injection - NON-DIALYSIS  100 mcg Subcutaneous Q Fri-1800  . feeding supplement (NEPRO CARB STEADY)  1,000 mL Per Tube  Q24H  . feeding supplement (PRO-STAT SUGAR FREE 64)  60 mL Per Tube QID  . fluconazole (DIFLUCAN) IV  100 mg Intravenous Q24H  . insulin aspart  0-9 Units Subcutaneous Q4H  . insulin glargine  25 Units Subcutaneous BID  . levalbuterol  1.25 mg Nebulization Q6H  . pantoprazole sodium  40 mg Per Tube Daily  . QUEtiapine  50 mg Oral BID  . rifaximin  550 mg Oral TID  . sodium chloride flush  10-40 mL Intracatheter Q12H  . vancomycin  1,250 mg Intravenous Q48H  . vancomycin  500 mg Oral Q6H   Infusions . sodium chloride 20 mL/hr at 08/09/15 0700  . EPINEPHrine 4 mg in dextrose 5% 250 mL infusion (16 mcg/mL) 15 mcg/min (08/09/15 0700)  . fentaNYL infusion INTRAVENOUS 300 mcg/hr (08/09/15 0925)  . midazolam (VERSED) infusion Stopped (07/26/15 0852)  . milrinone 0.125 mcg/kg/min (08/09/15 0700)  .  norepinephrine (LEVOPHED) Adult infusion 50 mcg/min (08/09/15 0824)  . dialysis replacement fluid (prismasate) 300 mL/hr at 08/08/15 2310  . dialysis replacement fluid (prismasate) 350 mL/hr at 08/08/15 2008  . dialysate (PRISMASATE) 1,500 mL/hr at 08/09/15 0508  . vasopressin (PITRESSIN) infusion - *FOR SHOCK* 0.01 Units/min (08/09/15 0700)    Dg Chest Port 1 View  08/09/2015  CLINICAL DATA:  Pneumothorax. EXAM: PORTABLE CHEST 1 VIEW COMPARISON:  08/06/2015. FINDINGS: The support apparatus is stable. There is a small persistent hydro pneumothorax on the right side. Persistent interstitial and airspace process in the lungs, likely pulmonary edema. No large pleural effusions. The tracheostomy tube is stable. The feeding tube is been removed. IMPRESSION: Stable cardiac enlargement and pulmonary edema. Stable support apparatus.  Small persistent pneumothorax. Electronically Signed   By: Marijo Sanes M.D.   On: 08/09/2015 07:23   Dg Chest Port 1 View  08/06/2015  CLINICAL DATA:  Chest tube.  CABG. EXAM: PORTABLE CHEST 1 VIEW COMPARISON:  07/17/2015. FINDINGS: Tracheostomy tube feeding tube bilateral central lines and right chest tube in stable position. Stable small right apical pneumothorax. Surgical staples right chest. Prior CABG. Cardiomegaly with progressive bilateral pulmonary infiltrates consistent pulmonary edema. Small right pleural effusion. IMPRESSION: 1. Lines and tubes including right chest tube in stable position. Stable right apical pneumothorax. 2. Prior CABG. Cardiomegaly with progressive bilateral pulmonary infiltrates consistent with progressive pulmonary edema. Small right pleural effusion. Electronically Signed   By: Marcello Moores  Register   On: 08/06/2015 07:41   Background 70 y.o. male with ESRD who was admitted 06/27/2015 with CP and subsequent redo CABG complicated by blood loss req transfusion and hemodynamic instability -s/p balloon pump, VDRF, shock, off and on CRRT since 07/16/15.  Progressive downhill course.  Shock liver, CDiff, coagulopathy/resp fx persistent, trach, pneumothoraces.  Refractory shock on 3 pressors.  Assessment/Recommendations  1. CAD- s/p complicated redo CABG 6/8- remains pressor dep. Epi/norepi/vasopressin + milronine. Poor prognosos. No further escalation of care but no de-escalation either... 2. ESRD -TTS [facility]-on CRRT off and on since 6/9. Continuously since 6/17.Trying to keep even but hypotension even with that and weight has increased 2 kg past 2 days d/t + fluid balance. Keeping going for electrolyte balance at this point. Daughter wishes to continue even knowing that it is to some degree prolonging the inevitable.  3. Shock liver 4. Coagulopathy  Prognosis exceedingly grim. Family does not wish to stop anything at this time. Spoke with daughter.   Jamal Maes, MD [facility] [phone] Pager 08/09/2015, 10:51 AM

## 2015-08-09 NOTE — Progress Notes (Signed)
17 Days Post-Op Procedure(s) (LRB): STERNAL CLOSURE WITH PUMP STANDBY (N/A) TRANSESOPHAGEAL ECHOCARDIOGRAM (TEE) (N/A) Subjective: Redo CABG June 8 for severe unstable angina with left main stenosis Preoperative thrombocytopenia, COPD, LV dysfunction and hepatic fibrosis Patient is currently with severe chronic liver failure from preoperative history of cirrhosis, bilirubin remains greater than 40. GI consult indicates reversal of hepatic insufficiency is not expected. Patient is being treated with comfort care with limited DO NOT RESUSCITATE. He appears comfortable and his family has been at bedside. He is on fentanyl drip 300 mcg/m  Objective: Vital signs in last 24 hours: Temp:  [90.5 F (32.5 C)-99.1 F (37.3 C)] 99.1 F (37.3 C) (07/03 1600) Pulse Rate:  [42-103] 72 (07/03 1745) Cardiac Rhythm:  [-] Sinus bradycardia (07/03 1600) Resp:  [6-25] 16 (07/03 1745) BP: (49-57)/(20-26) 57/25 mmHg (07/03 1501) SpO2:  [93 %-100 %] 100 % (07/03 1745) Arterial Line BP: (48-125)/(19-43) 83/34 mmHg (07/03 1745) FiO2 (%):  [40 %-60 %] 40 % (07/03 1501) Weight:  [212 lb 15.4 oz (96.6 kg)] 212 lb 15.4 oz (96.6 kg) (07/03 0500)  Hemodynamic parameters for last 24 hours:  blood pressure 55 systolic  Intake/Output from previous day: 07/02 0701 - 07/03 0700 In: 3555.7 [I.V.:3505.7; IV Piggyback:50] Out: 2001 [Stool:50; Chest Tube:10] Intake/Output this shift: Total I/O In: 998 [I.V.:948; IV Piggyback:50] Out: 2 [Other:2]  Icteric male somnolent appears comfortable but nonresponding Surgical incisions clean and dry  Lab Results:  Recent Labs  08/08/15 0350 08/09/15 0330  WBC 16.7* 18.1*  HGB 8.3* 8.5*  HCT 27.2* 28.6*  PLT 118* 132*   BMET:  Recent Labs  08/08/15 0350 08/09/15 0330  NA 133* 136  138  K 3.8 3.9  3.9  CL 99* 94*  95*  CO2 24 25  26   GLUCOSE 135* 144*  144*  BUN 41* 28*  27*  CREATININE 1.71* 1.41*  1.36*  CALCIUM 8.3* 9.2  9.2    PT/INR: No  results for input(s): LABPROT, INR in the last 72 hours. ABG    Component Value Date/Time   PHART 7.299* 08/09/2015 0915   HCO3 28.3* 08/09/2015 0915   TCO2 30 08/09/2015 0915   ACIDBASEDEF 3.0* 08/09/2015 0400   O2SAT 94.0 08/09/2015 0915   CBG (last 3)   Recent Labs  08/08/15 1612 08/08/15 2018 08/09/15 0229  GLUCAP 96 99 125*    Assessment/Plan: S/P Procedure(s) (LRB): STERNAL CLOSURE WITH PUMP STANDBY (N/A) TRANSESOPHAGEAL ECHOCARDIOGRAM (TEE) (N/A) Continue current comfort care and limited DO NOT RESUSCITATE   LOS: 34 days    Tharon Aquas Trigt III 08/09/2015

## 2015-08-09 NOTE — Progress Notes (Signed)
Assisted Dr. Corrie Dandy in trach replacement (#6 Cuffed [place]iley), verified placement with EtCO2. Patient was hyper-oxygenated on 100% prior to trach change. Patient tolerated switch well with no desaturations, brady, etc. Patient no longer has cuff leak and is placed back on the ventilator.

## 2015-08-10 LAB — CBC
HCT: 28.4 % — ABNORMAL LOW (ref 39.0–52.0)
Hemoglobin: 8.5 g/dL — ABNORMAL LOW (ref 13.0–17.0)
MCH: 30.1 pg (ref 26.0–34.0)
MCHC: 29.9 g/dL — ABNORMAL LOW (ref 30.0–36.0)
MCV: 100.7 fL — ABNORMAL HIGH (ref 78.0–100.0)
Platelets: 145 10*3/uL — ABNORMAL LOW (ref 150–400)
RBC: 2.82 MIL/uL — ABNORMAL LOW (ref 4.22–5.81)
RDW: 25 % — ABNORMAL HIGH (ref 11.5–15.5)
WBC: 14.8 10*3/uL — ABNORMAL HIGH (ref 4.0–10.5)

## 2015-08-10 LAB — RENAL FUNCTION PANEL
Albumin: 1.7 g/dL — ABNORMAL LOW (ref 3.5–5.0)
Anion gap: 8 (ref 5–15)
BUN: 25 mg/dL — ABNORMAL HIGH (ref 6–20)
CALCIUM: 8 mg/dL — AB (ref 8.9–10.3)
CHLORIDE: 100 mmol/L — AB (ref 101–111)
CO2: 23 mmol/L (ref 22–32)
CREATININE: 1.56 mg/dL — AB (ref 0.61–1.24)
GFR calc Af Amer: 51 mL/min — ABNORMAL LOW (ref >60)
GFR calc non Af Amer: 44 mL/min — ABNORMAL LOW (ref >60)
GLUCOSE: 144 mg/dL — AB (ref 65–99)
Phosphorus: 5.6 mg/dL — ABNORMAL HIGH (ref 2.5–4.6)
Potassium: 4.4 mmol/L (ref 3.5–5.1)
SODIUM: 131 mmol/L — AB (ref 135–145)

## 2015-08-10 LAB — GLUCOSE, CAPILLARY
GLUCOSE-CAPILLARY: 123 mg/dL — AB (ref 65–99)
GLUCOSE-CAPILLARY: 133 mg/dL — AB (ref 65–99)
Glucose-Capillary: 106 mg/dL — ABNORMAL HIGH (ref 65–99)
Glucose-Capillary: 117 mg/dL — ABNORMAL HIGH (ref 65–99)
Glucose-Capillary: 134 mg/dL — ABNORMAL HIGH (ref 65–99)
Glucose-Capillary: 146 mg/dL — ABNORMAL HIGH (ref 65–99)

## 2015-08-10 LAB — CULTURE, RESPIRATORY W GRAM STAIN

## 2015-08-10 LAB — CALCIUM, IONIZED: Calcium, Ionized, Serum: 4.3 mg/dL — ABNORMAL LOW (ref 4.5–5.6)

## 2015-08-12 LAB — CALCIUM, IONIZED

## 2015-09-07 NOTE — Discharge Summary (Signed)
[name], PORTNOY Joseph [name].:  [id number]  MEDICAL RECORD Joseph [name].:  [id number]  LOCATION:  2S07C                        FACILITY:  [facility]  PHYSICIAN:  Joseph [name] [name], M.D.  DATE OF BIRTH:  03/01/1945  DATE OF ADMISSION:  06/21/2015 DATE OF DISCHARGE:  08/27/2015                              DISCHARGE SUMMARY   ADMISSION DIAGNOSES: 1. Unstable angina, 95% left main stenosis, status post coronary     artery bypass graft x5 at [facility] in 2001, ischemic     cardiomyopathy, status post percutaneous coronary intervention of     left main at [facility], July 2016. 2. Chronic renal failure on hemodialysis since 2016. 3. 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 4. History of hepatic fibrosis-cirrhosis documented by liver biopsy at     [facility] in 2016. 5. Chronic obstructive pulmonary disease. 6. Gastroesophageal reflux disease. 7. History of prostate cancer. 8. Class 3 heart failure from ischemic cardiomyopathy. 9. Type 2 diabetes mellitus.  OPERATIONS AND PROCEDURES: 1. Cardiac catheterization on Jul 07, 2015, by Dr. Daneen [name]. 2. Redo coronary artery bypass graft x2 on July 15, 2015 by Dr. Prescott [name]. 3. Mediastinal reexploration for bleeding at midnight on July 15, 2015. 4. Delayed sternal closure on July 23, 2015, by Dr. Prescott [name]. 5. Percutaneous tracheostomy by CCM-Dr. Nelda [name], on July 27, 2015. 6. Placement of right subclavian vein temporary hemodialysis catheter     on July 30, 2015, by Dr. Prescott [name].  HOSPITAL COURSE:  The patient is a 70 year old Caucasian male, who was admitted to the Cardiology Service with unstable angina.  The patient approximately 1 year previously had undergone PCI with stents placed into his left main coronary artery for progressive angina.  At that time, a CT surgical  evaluation was not requested apparently because it was felt that patient was not an operative candidate.  He has multiple comorbid problems as listed on the admission diagnoses.  At this point, the patient had Joseph [name] other options and a repeat PCI of the left main was not possible according to the Interventional Radiology team.  CT surgical evaluation was requested for high-risk redo CABG.  I felt that the patient was potentially a candidate for redo bypass surgery, but was concerned over his chronic thrombocytopenia and previous somewhat abnormal bone marrow biopsy.  A Hematology consult by Dr. Beryle [name], was obtained.  The patient was felt to have chronic thrombocytopenia, Joseph [name] evidence of underlying malignancy, and Joseph [name] preformed antiplatelet antibodies were determined.  Hematology consultation did not recommend preoperative steroids or gammaglobulin preparation.  On July 15, 2015, the patient underwent redo CABG x2, to graft the diagonal branch of the LAD and the circumflex marginal.  The patient did not have enough autologous vein and a segment of CryoVein was used for the diagonal graft.  The mammary artery graft to LAD was open, but 4 other vein grafts placed in 2001, had been closed.  Difficulties at the time of surgery were encountered.  The right ventricle  was adherent to the undersurface of the sternum.  Bleeding and sternal reentry was controlled, but required cannulation of the right axillary artery and venous return from the right femoral vein.  Bypass grafts were placed with good flow.  The anterior surface of the right ventricle was repaired with an autologous pericardial patch.  The patient was separated from cardiopulmonary bypass on an intra-aortic balloon pump and was hemodynamically stable.  However, he had severe coagulopathy and massive transfusion requirement.  We separated from cardiopulmonary bypass.  His platelet count was only 12,000.  ACT on bypass after just 8000  units of heparin was greater than 1500 for several hours.  It was not possible to close the sternum because of RV dysfunction and his coagulopathy.  The skin was closed over the sternum, but the sternal edges were left open.  The patient returned to the ICU and remained hemodynamically stable, but had persistent coagulopathy despite multiple blood product administration and factor VII.  The patient was returned to the operating room the night of surgery. Diffuse bleeding was encountered and more topical hemostatic agents were applied as well as more blood products and the bleeding was then controlled.  The sternum was left open.  Over the next week, the patient was treated with CVVH to remove the severe volume overload from the massive transfusion requirement from bleeding resulting from his coagulopathy.  After several pounds of excess fluid were removed, the patient was returned to the operating room.  An echo showed baseline moderately reduced LV and RV function, but adequate hemodynamic for sternal closure.  The sternum was rewired without problems.  Cultures were negative.  The patient returned to the ICU and within 48 hours, the balloon pump had been weaned and removed.  At this point, the patient developed chronic and progressive liver failure.  The patient had known underlying hepatic cirrhosis-fibrosis and this was probably related to his thrombocytopenia.  The patient required enteral nutrition, but however developed an ileus and then received a short course of TPN.  This worsened his hepatic function and the TPN was stopped.  Percutaneous tracheostomy was performed.  The patient had Joseph [name] evidence of neurologic deficit.  Joseph [name] focal weakness and would intermittently be responsive and engaged.  He was highly anxious and had chronic anxiety disorder and required long-term IV sedation.  The patient was on antibiotics for possible postoperative pneumonia.  He was on antibiotics for  culture proven C. diff colitis.  He continued to have rising bilirubin from 20-32, then over 40.  Transaminases were mild- to-moderately elevated.  Ultrasound of the right upper quadrant was done twice which showed Joseph [name] evidence of bile duct dilatation and Joseph [name] evidence of cholecystitis.  Fractionated bilirubin showed mixed direct and indirect bilirubin.  GI consult felt that the patient's jaundice was due to chronic cirrhosis with acute liver failure and that the chance for liver recovery was unrealistic.  At this point, the patient was responsive.  Neuro intact intermittently depending on the level of sedation.  He was very vasodilated with excellent cardiac output based on hemodynamic parameters and felt to have vasodilation from his cirrhosis.  The surgical incisions had healed; however with little if any chance of regaining liver function according to GI consult, the patient was made a limited DNR and comfort measures and palliative care was initiated.  Within 3-5 days after the comfort measures only, the patient quietly expired with family at the bedside on 09/02/2015.  FINAL DIAGNOSES: 1. Ischemic cardiomyopathy, unstable angina,  status post previous     coronary artery bypass graft x5 at [place] in 2001. 2. Underlying cirrhosis with postoperative acute-on-chronic liver     failure. 3. Chronic renal failure on hemodialysis. 4. Preoperative thrombocytopenia with severe postoperative     coagulopathy requiring massive transfusion. 5. Type 2 diabetes mellitus. 6. Chronic obstructive pulmonary disease. 7. Depression-anxiety disorder.     Joseph [name] [name], M.D.     PV/MEDQ  D:  08/11/2015  T:  08/11/2015  Job:  [id number]

## 2015-09-07 NOTE — Progress Notes (Addendum)
D./w RN  CRRT stopped after filter clotted. Family agred to it. Pressors ongoing without escalation. Concomitant palliative support and full medical care without esclatation + DNR. SBP 50s  pccm will re-round 08/11/15 if alive - prognosis few to several hours    Dr. Brand Males, M.D., [facility].C.P Pulmonary and Critical Care Medicine Staff Physician [place] [facility] Pager: [phone], If no answer or between  15:00h - 7:00h: call [phone]  08/24/2015 10:53 AM

## 2015-09-07 NOTE — Progress Notes (Signed)
Pt expired with family at the bedside.  Verified with Hurshel Party, RN.  No heart sounds auscultated.

## 2015-09-07 NOTE — Progress Notes (Signed)
   Aug 24, 2015 1400  Clinical Encounter Type  Visited With Patient and family together  Visit Type Patient actively dying  Referral From Nurse  Spiritual Encounters  Spiritual Needs Prayer  Stress Factors  Family Stress Factors Loss  Ran into nurse, who requested I visit long-term patient who is now actively dying. Prayed with family and patient, who was not alert, and offered presence and prayer at any time during this time. Family seemed sad but at peace; chaplain hopes the length of time patient has been in hospital has given them time to adjust to this eventual outcome. Linlee Cromie, Chaplain

## 2015-09-07 NOTE — Progress Notes (Signed)
18 Days Post-Op Procedure(s) (LRB): STERNAL CLOSURE WITH PUMP STANDBY (N/A) TRANSESOPHAGEAL ECHOCARDIOGRAM (TEE) (N/A) Subjective: Eyes open, not responsive  Objective: Vital signs in last 24 hours: Temp:  [97.6 F (36.4 C)-99.1 F (37.3 C)] 97.6 F (36.4 C) (07/04 0016) Pulse Rate:  [45-103] 66 (07/04 0728) Cardiac Rhythm:  [-] Normal sinus rhythm (07/04 0600) Resp:  [7-25] 17 (07/04 0728) BP: (53-57)/(25-27) 53/27 mmHg (07/04 0728) SpO2:  [94 %-100 %] 100 % (07/04 0728) Arterial Line BP: (48-99)/(21-41) 71/29 mmHg (07/04 0630) FiO2 (%):  [40 %-60 %] 40 % (07/04 0728)  Hemodynamic parameters for last 24 hours:    Intake/Output from previous day: 07/03 0701 - 07/04 0700 In: 4137 [I.V.:3837; IV Piggyback:300] Out: 12 [Chest Tube:20] Intake/Output this shift:    -Jaundiced Unresponsive but BP does rise in response to voice Bradycardic  Lab Results:  Recent Labs  08/09/15 0330 06-Sep-2015 0410  WBC 18.1* 14.8*  HGB 8.5* 8.5*  HCT 28.6* 28.4*  PLT 132* 145*   BMET:  Recent Labs  08/09/15 0330 06-Sep-2015 0410  NA 136  138 131*  K 3.9  3.9 4.4  CL 94*  95* 100*  CO2 25  26 23   GLUCOSE 144*  144* 144*  BUN 28*  27* 25*  CREATININE 1.41*  1.36* 1.56*  CALCIUM 9.2  9.2 8.0*    PT/INR: No results for input(s): LABPROT, INR in the last 72 hours. ABG    Component Value Date/Time   PHART 7.299* 08/09/2015 0915   HCO3 28.3* 08/09/2015 0915   TCO2 30 08/09/2015 0915   ACIDBASEDEF 3.0* 08/09/2015 0400   O2SAT 94.0 08/09/2015 0915   CBG (last 3)   Recent Labs  08/09/15 1547 08/09/15 1930 September 06, 2015 0013  GLUCAP 117* 100* 146*    Assessment/Plan: S/P Procedure(s) (LRB): STERNAL CLOSURE WITH PUMP STANDBY (N/A) TRANSESOPHAGEAL ECHOCARDIOGRAM (TEE) (N/A) Remains critically ill with death imminent  Comfort care DNR   LOS: 35 days    Joseph [name] September 06, 2015

## 2015-09-07 NOTE — Progress Notes (Signed)
Pt expired at 1446.  Family was at bedside at the time of death.  CDS was notified and the pt is "on-hold" for tissue donation.  Pt has ruled out for organ and eye donation.  Dr. Roxan Hockey is on-call for the holiday and was notified of the patient's death.  Post-mortem checklist is complete, patient placement has been notified and pt is being transferred to the morgue.

## 2015-09-07 NOTE — Progress Notes (Signed)
Subjective: Interval History: no change , on vent, on 3 pressors, milrinone.  Severe jaundice. .  Objective: Vital signs in last 24 hours: Temp:  [97.6 F (36.4 C)-99.1 F (37.3 C)] 97.6 F (36.4 C) (07/04 0016) Pulse Rate:  [42-103] 66 (07/04 0630) Resp:  [7-25] 12 (07/04 0630) BP: (49-57)/(20-26) 57/25 mmHg (07/03 1501) SpO2:  [94 %-100 %] 99 % (07/04 0630) Arterial Line BP: (48-99)/(19-41) 71/29 mmHg (07/04 0630) FiO2 (%):  [40 %-60 %] 60 % (07/04 0630) Weight change:   Intake/Output from previous day: 07/03 0701 - 07/04 0700 In: 4137 [I.V.:3837; IV Piggyback:300] Out: 12 [Chest Tube:20] Intake/Output this shift:    General appearance: toxic and not responding approp. Resp: diminished breath sounds bilaterally and rales bibasilar Chest wall: R [place] HD cath, R CT,  Cardio: REg 68 GI: soft, non-tender; bowel sounds normal; no masses,  no organomegaly and mild distension, no BS Extremities: edema 4+  Severe Jaundice  Lab Results:  Recent Labs  08/09/15 0330 2015/08/12 0410  WBC 18.1* 14.8*  HGB 8.5* 8.5*  HCT 28.6* 28.4*  PLT 132* 145*   BMET:  Recent Labs  08/09/15 0330 2015-08-12 0410  NA 136  138 131*  K 3.9  3.9 4.4  CL 94*  95* 100*  CO2 25  26 23   GLUCOSE 144*  144* 144*  BUN 28*  27* 25*  CREATININE 1.41*  1.36* 1.56*  CALCIUM 9.2  9.2 8.0*   No results for input(s): PTH in the last 72 hours. Iron Studies: No results for input(s): IRON, TIBC, TRANSFERRIN, FERRITIN in the last 72 hours.  Studies/Results: Dg Chest Port 1 View  08/09/2015  CLINICAL DATA:  Pneumothorax. EXAM: PORTABLE CHEST 1 VIEW COMPARISON:  08/06/2015. FINDINGS: The support apparatus is stable. There is a small persistent hydro pneumothorax on the right side. Persistent interstitial and airspace process in the lungs, likely pulmonary edema. No large pleural effusions. The tracheostomy tube is stable. The feeding tube is been removed. IMPRESSION: Stable cardiac enlargement and  pulmonary edema. Stable support apparatus.  Small persistent pneumothorax. Electronically Signed   By: Marijo Sanes M.D.   On: 08/09/2015 07:23    I have reviewed the patient's current medications.  Assessment/Plan: 1 ESRD  Vol xs,  Able to maintain acid/base/K, solute with CRRT.   NOT able to remove vol with low bps.  Mechanics ok 2 s/p CABG cardiogenic schock 3 Cirrhosis 4 Resp failure 5 Anemia 6 DM controlled 7 Ileus. 8 Infx on Vanco, fluc,  P CRRT, supportive care, at this point is futile care.       LOS: 35 days   Joseph [name] 12-Aug-2015,7:05 AM

## 2015-09-07 DEATH — deceased

## 2015-09-20 ENCOUNTER — Other Ambulatory Visit: Payer: Medicare Other

## 2015-09-22 ENCOUNTER — Ambulatory Visit: Payer: Medicare Other | Admitting: Cardiology

## 2016-09-28 IMAGING — CR DG CHEST 1V PORT
1 series · 1 of 1 positions shown · non-contrast
Comparison: 12/30/2014

CLINICAL DATA: Chest pain

EXAM:
PORTABLE CHEST 1 VIEW

[AP]
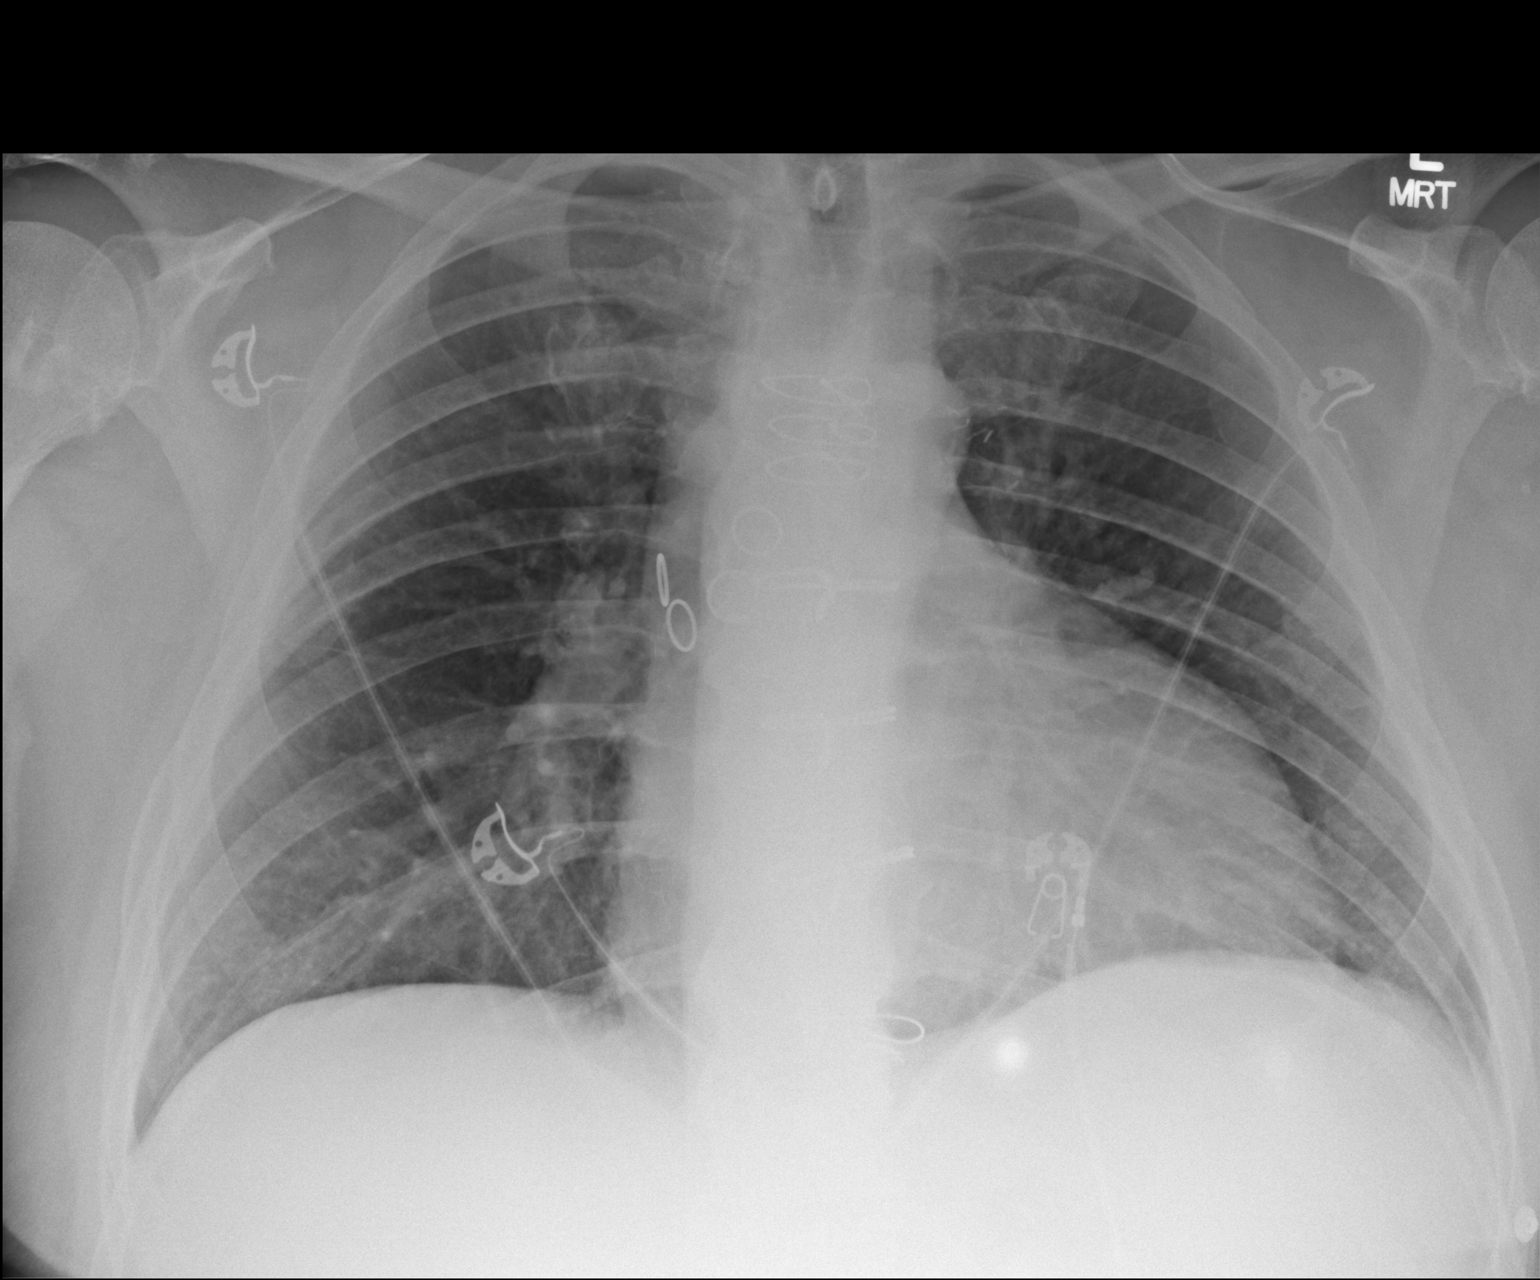

[1 of 1 positions shown; findings below may reference images not displayed]

FINDINGS: Cardiac enlargement with prior CABG. Negative for heart failure.
Lungs are clear without infiltrate or effusion. No mass lesion.
IMPRESSION: No active disease.

## 2016-10-26 IMAGING — DX DG CHEST 1V PORT
1 series · 1 of 1 positions shown · non-contrast
Comparison: August 01, 2015

CLINICAL DATA: Shortness of breath

EXAM:
PORTABLE CHEST 1 VIEW

[chest ap]
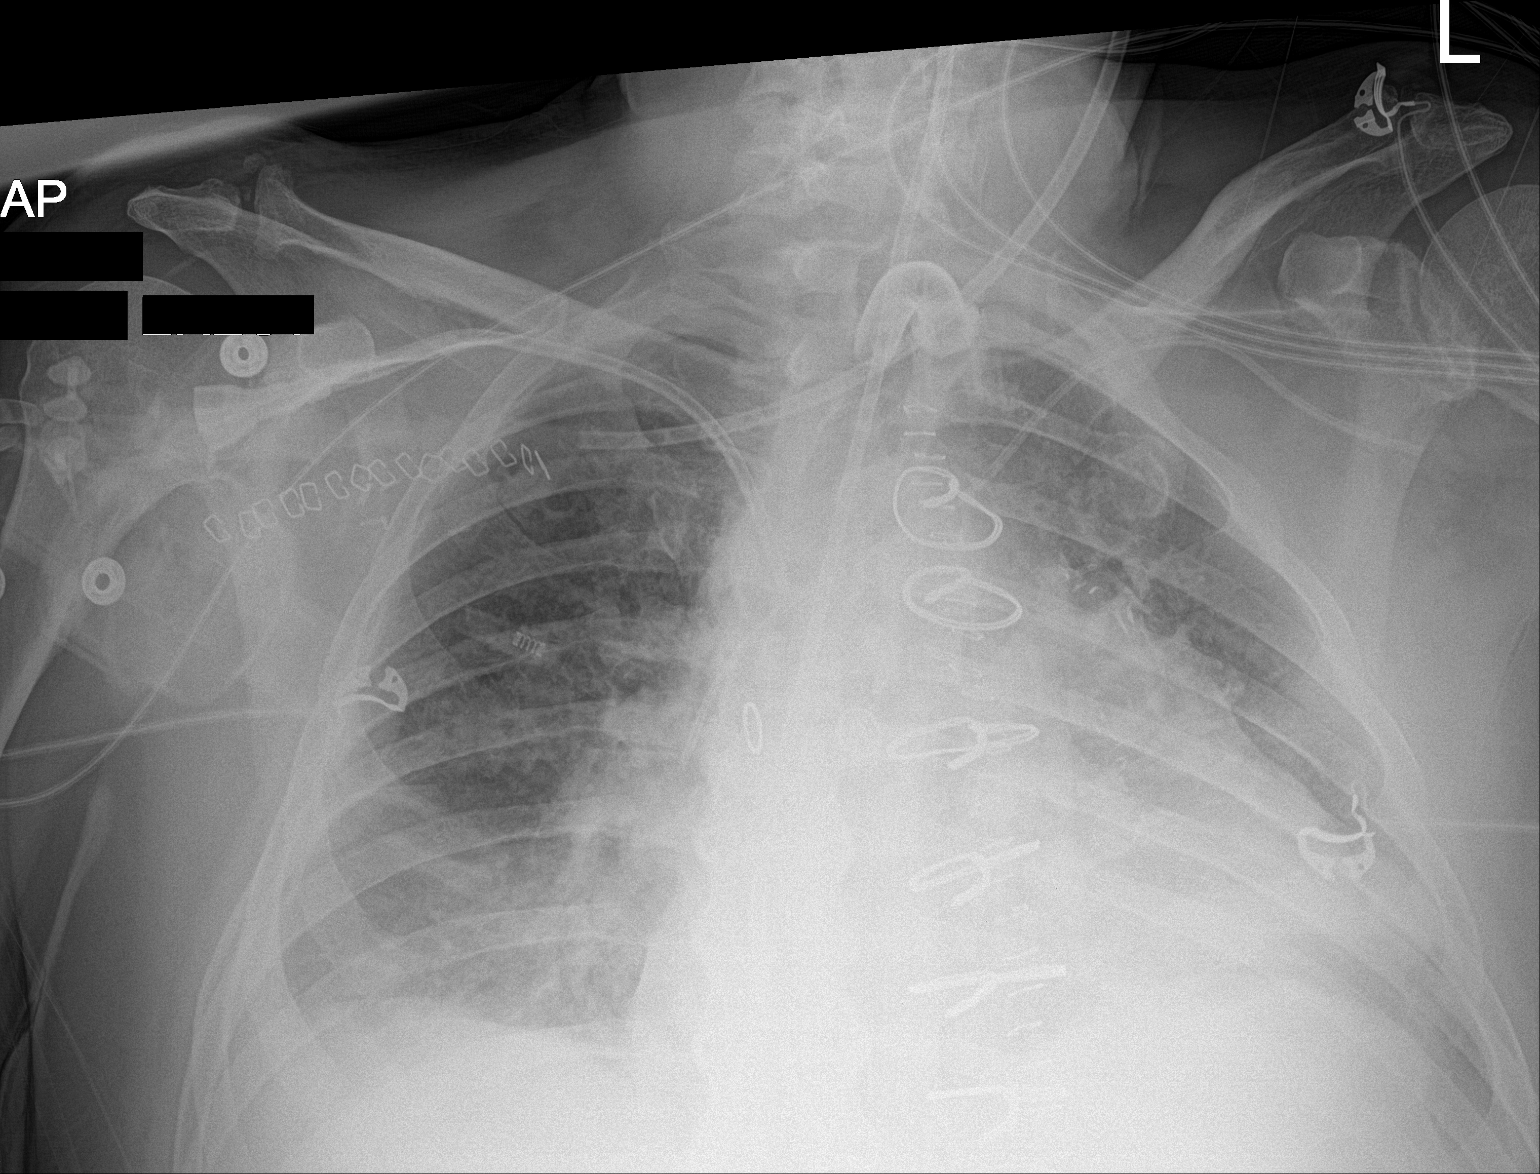

[1 of 1 positions shown; findings below may reference images not displayed]

FINDINGS: Endotracheal tube tip is 5.6 cm above the carina. Central catheter
tips are in the superior vena cava. Feeding tube tip is below the
diaphragm. No pneumothorax. Left chest tube no longer present.

There are small pleural effusions bilaterally with mild interstitial
edema. No airspace consolidation. There is, however, left base
atelectasis. Heart is enlarged. The pulmonary vascularity is within
normal limits. No adenopathy evident. Patient is status post
coronary artery bypass grafting.
IMPRESSION: Cardiomegaly with mild interstitial edema. Suspect a degree of
congestive heart failure. No airspace consolidation. There is,
however, left base atelectasis. Tube and catheter positions as
described without pneumothorax.

## 2016-10-28 IMAGING — CR DG CHEST 1V PORT
1 series · 1 of 1 positions shown · non-contrast
Comparison: 08/03/2015

CLINICAL DATA: Chest tube.

EXAM:
PORTABLE CHEST 1 VIEW

[AP]
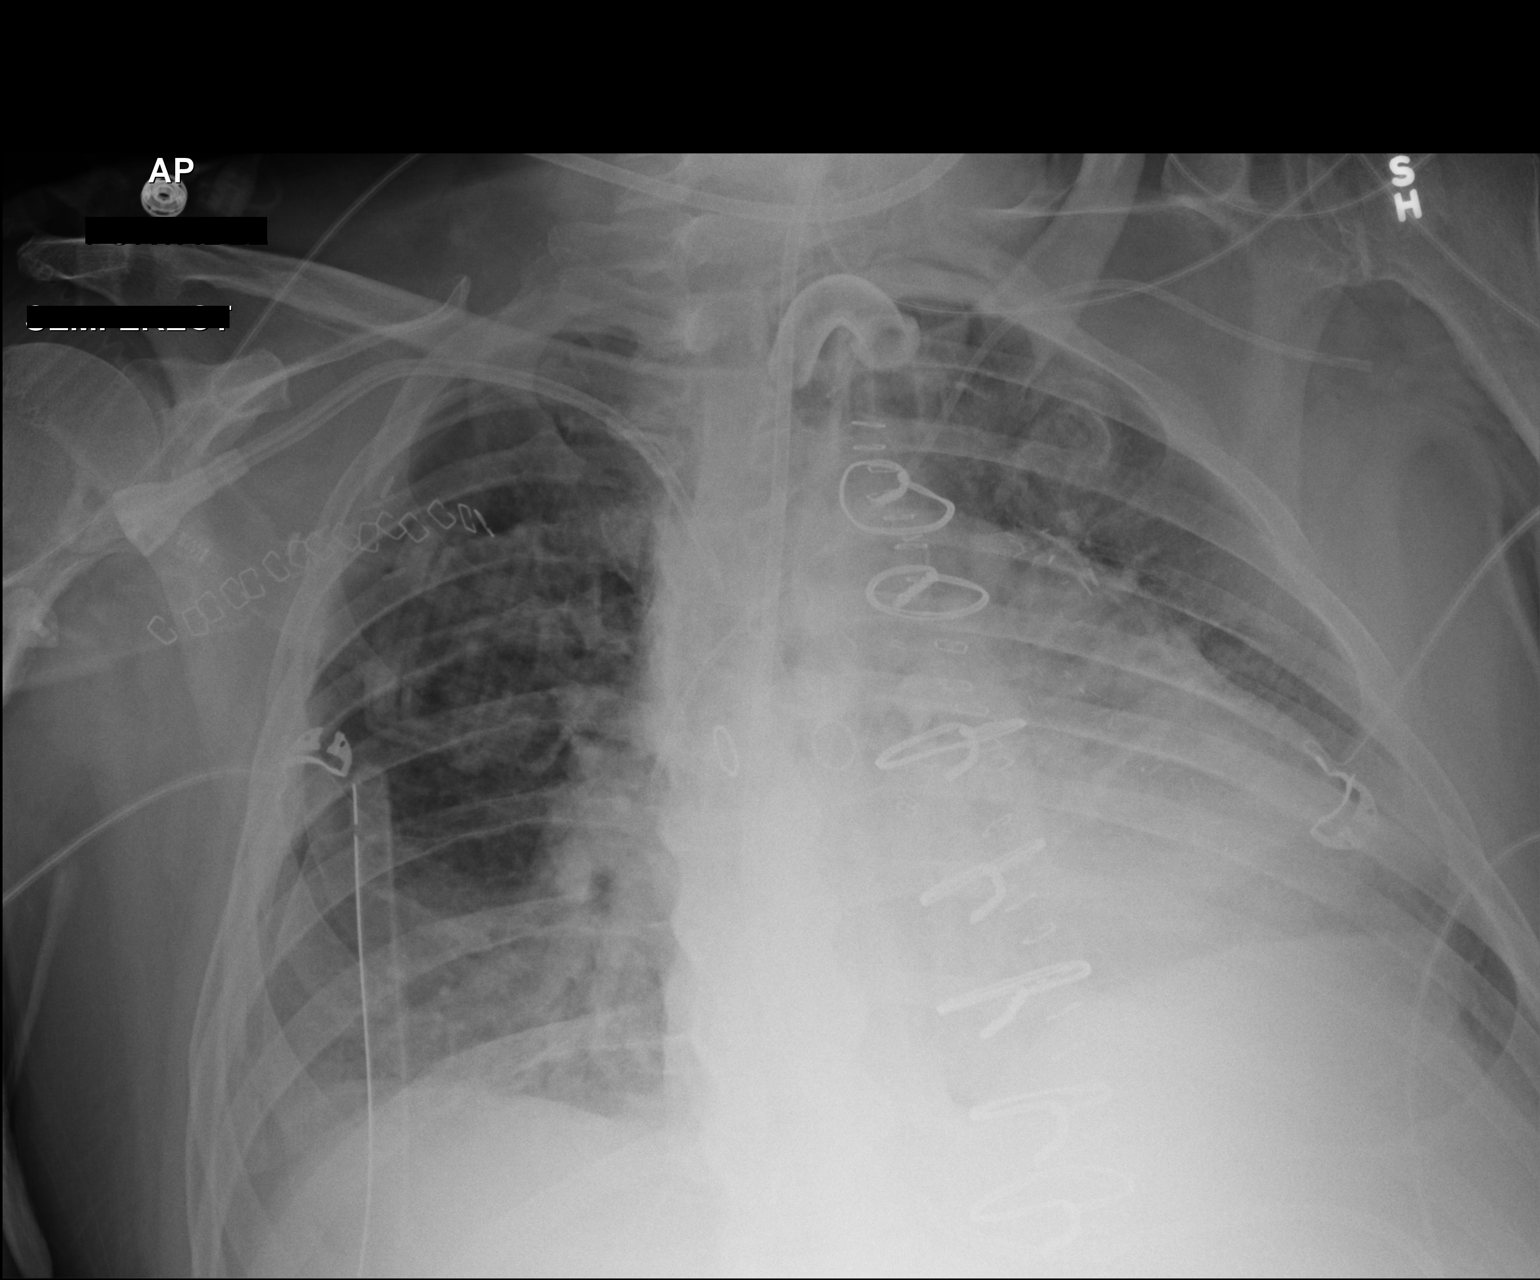

[1 of 1 positions shown; findings below may reference images not displayed]

FINDINGS: Tracheostomy in good position. Feeding tube enters the stomach with
the tip not visualized.

Right subclavian central venous catheter in the proximal SVC. Right
chest tube in place. Small pneumothorax in the right lateral lung
base and right apex has progressed in the interval.

Left subclavian central venous catheter tip in the SVC unchanged.

Pulmonary vascular congestion unchanged. Bibasilar atelectasis
unchanged.
IMPRESSION: Progression of small right pneumothorax. Right chest tube remains in
place

Pulmonary vascular congestion unchanged

Bibasilar atelectasis unchanged.

## 2016-10-30 IMAGING — CR DG CHEST 1V PORT
2 series · 2 of 2 positions shown · non-contrast
Comparison: 07/17/2015.

CLINICAL DATA: Chest tube.  CABG.

EXAM:
PORTABLE CHEST 1 VIEW

[AP (1 of 2)]
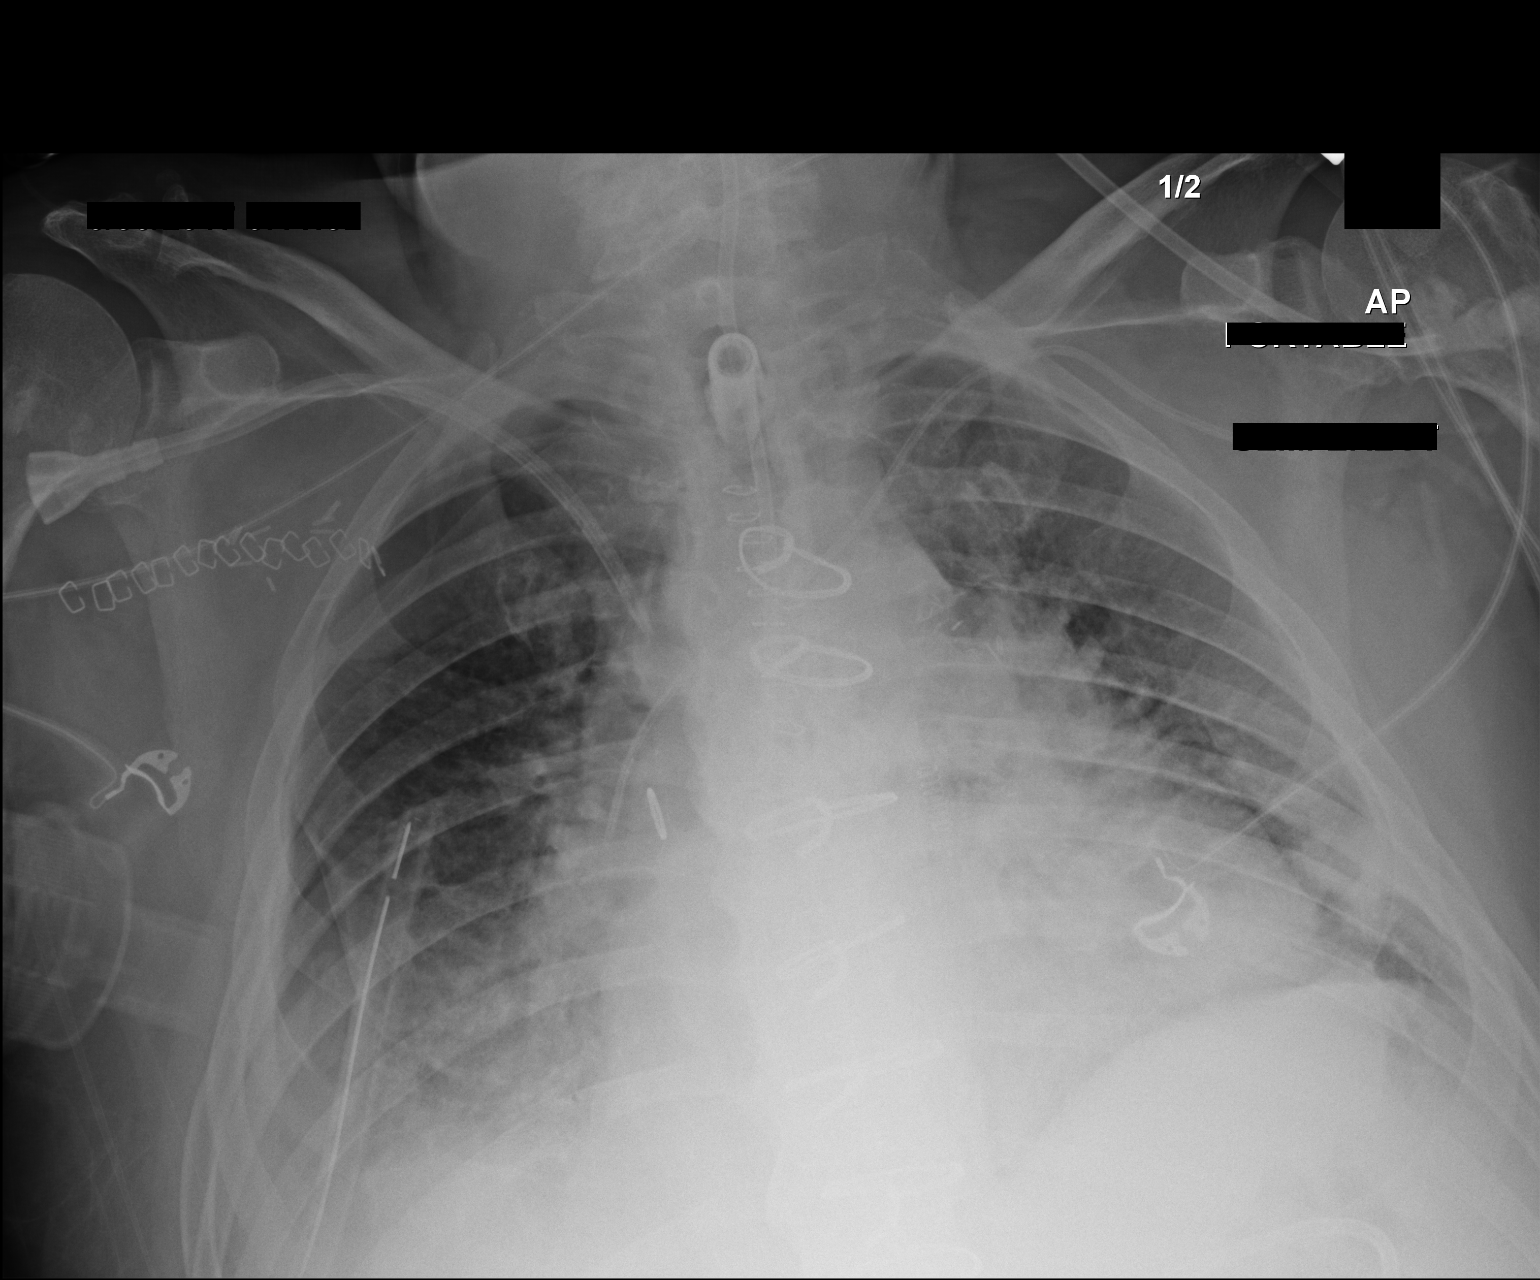

[AP (2 of 2)]
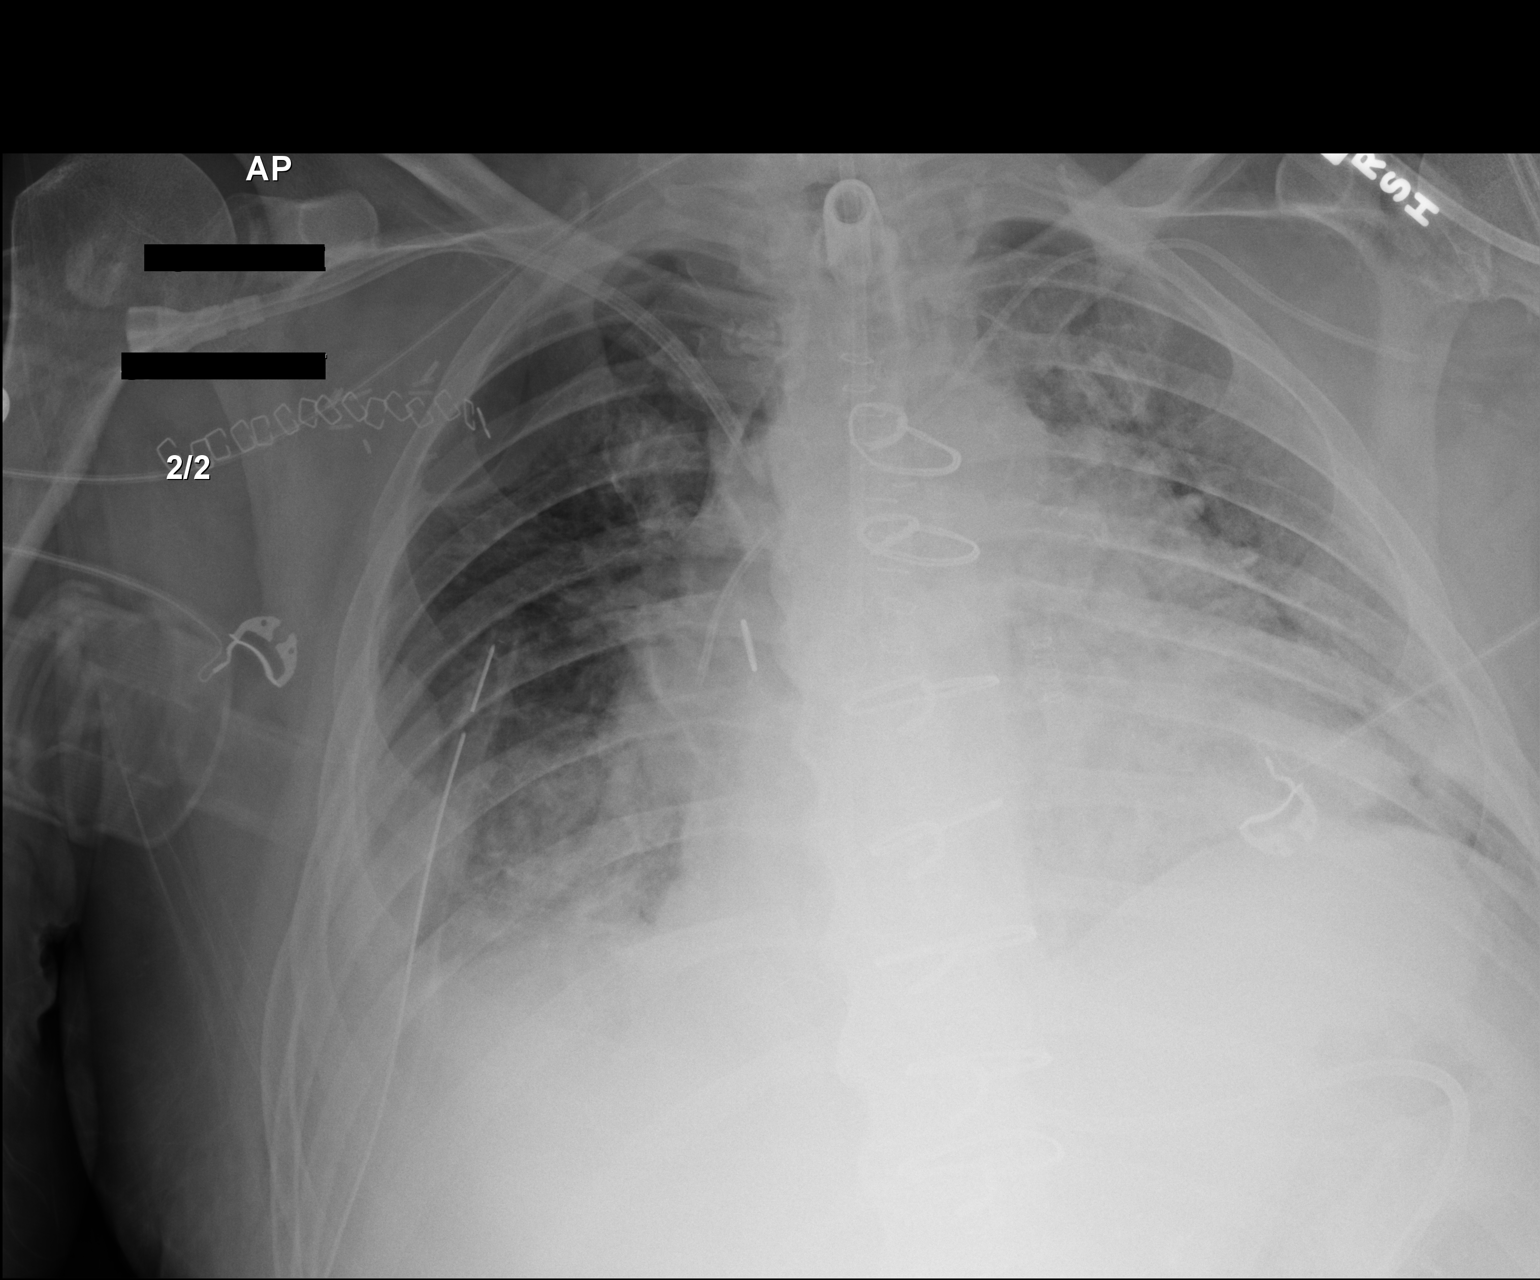

[2 of 2 positions shown; findings below may reference images not displayed]

FINDINGS: Tracheostomy tube feeding tube bilateral central lines and right
chest tube in stable position. Stable small right apical
pneumothorax. Surgical staples right chest. Prior CABG. Cardiomegaly
with progressive bilateral pulmonary infiltrates consistent
pulmonary edema. Small right pleural effusion.
IMPRESSION: 1. Lines and tubes including right chest tube in stable position.
Stable right apical pneumothorax.

2. Prior CABG. Cardiomegaly with progressive bilateral pulmonary
infiltrates consistent with progressive pulmonary edema. Small right
pleural effusion.
